# Patient Record
Sex: Female | Born: 1945 | Race: Black or African American | Hispanic: No | Marital: Single
Health system: Midwestern US, Community
[De-identification: ages and names within clinical notes are randomized; demographics above are authoritative.]

## PROBLEM LIST (undated history)

## (undated) DIAGNOSIS — R4182 Altered mental status, unspecified: Secondary | ICD-10-CM

## (undated) DIAGNOSIS — R748 Abnormal levels of other serum enzymes: Secondary | ICD-10-CM

## (undated) DIAGNOSIS — G40909 Epilepsy, unspecified, not intractable, without status epilepticus: Principal | ICD-10-CM

## (undated) DIAGNOSIS — R4701 Aphasia: Secondary | ICD-10-CM

## (undated) DIAGNOSIS — J329 Chronic sinusitis, unspecified: Secondary | ICD-10-CM

## (undated) DIAGNOSIS — K219 Gastro-esophageal reflux disease without esophagitis: Secondary | ICD-10-CM

## (undated) DIAGNOSIS — IMO0001 Reserved for inherently not codable concepts without codable children: Secondary | ICD-10-CM

## (undated) DIAGNOSIS — M109 Gout, unspecified: Secondary | ICD-10-CM

## (undated) DIAGNOSIS — I10 Essential (primary) hypertension: Secondary | ICD-10-CM

## (undated) DIAGNOSIS — G4733 Obstructive sleep apnea (adult) (pediatric): Secondary | ICD-10-CM

## (undated) DIAGNOSIS — R569 Unspecified convulsions: Secondary | ICD-10-CM

## (undated) DIAGNOSIS — G4710 Hypersomnia, unspecified: Secondary | ICD-10-CM

## (undated) DIAGNOSIS — G3184 Mild cognitive impairment, so stated: Secondary | ICD-10-CM

## (undated) DIAGNOSIS — R413 Other amnesia: Secondary | ICD-10-CM

## (undated) DIAGNOSIS — Z1211 Encounter for screening for malignant neoplasm of colon: Secondary | ICD-10-CM

## (undated) DIAGNOSIS — F32A Depression, unspecified: Secondary | ICD-10-CM

## (undated) HISTORY — DX: Gastro-esophageal reflux disease without esophagitis: K21.9

## (undated) HISTORY — DX: Obstructive sleep apnea (adult) (pediatric): G47.33

## (undated) HISTORY — DX: Chronic sinusitis, unspecified: J32.9

## (undated) HISTORY — DX: Reserved for inherently not codable concepts without codable children: IMO0001

## (undated) HISTORY — DX: Essential (primary) hypertension: I10

## (undated) HISTORY — DX: Gout, unspecified: M10.9

## (undated) HISTORY — PX: COLONOSCOPY: SHX174

## (undated) MED ORDER — OXYCODONE-ACETAMINOPHEN 5 MG-325 MG TAB
5-325 mg | ORAL_TABLET | Freq: Four times a day (QID) | ORAL | Status: DC | PRN
Start: ? — End: 2014-06-19

## (undated) MED ORDER — LEVETIRACETAM 500 MG TAB
500 mg | ORAL_TABLET | Freq: Two times a day (BID) | ORAL | Status: DC
Start: ? — End: 2013-06-30

## (undated) MED ORDER — DIAZEPAM 5 MG TAB
5 mg | ORAL_TABLET | Freq: Two times a day (BID) | ORAL | Status: DC | PRN
Start: ? — End: 2017-03-26

---

## 2001-09-23 ENCOUNTER — Encounter: Payer: Self-pay | Admitting: Internal Medicine

## 2001-09-23 ENCOUNTER — Ambulatory Visit (HOSPITAL_COMMUNITY): Admission: RE | Admit: 2001-09-23 | Discharge: 2001-09-23 | Payer: Self-pay | Admitting: Internal Medicine

## 2001-10-03 ENCOUNTER — Encounter: Payer: Self-pay | Admitting: Internal Medicine

## 2001-10-03 ENCOUNTER — Ambulatory Visit (HOSPITAL_COMMUNITY): Admission: RE | Admit: 2001-10-03 | Discharge: 2001-10-03 | Payer: Self-pay | Admitting: Internal Medicine

## 2001-10-14 ENCOUNTER — Ambulatory Visit (HOSPITAL_COMMUNITY): Admission: RE | Admit: 2001-10-14 | Discharge: 2001-10-14 | Payer: Self-pay | Admitting: Internal Medicine

## 2002-02-05 ENCOUNTER — Other Ambulatory Visit: Admission: RE | Admit: 2002-02-05 | Discharge: 2002-02-05 | Payer: Self-pay | Admitting: *Deleted

## 2002-02-05 ENCOUNTER — Encounter: Payer: Self-pay | Admitting: *Deleted

## 2002-02-05 ENCOUNTER — Ambulatory Visit (HOSPITAL_COMMUNITY): Admission: RE | Admit: 2002-02-05 | Discharge: 2002-02-05 | Payer: Self-pay | Admitting: *Deleted

## 2002-05-01 ENCOUNTER — Ambulatory Visit (HOSPITAL_COMMUNITY): Admission: RE | Admit: 2002-05-01 | Discharge: 2002-05-01 | Payer: Self-pay | Admitting: *Deleted

## 2002-11-10 ENCOUNTER — Ambulatory Visit (HOSPITAL_COMMUNITY): Admission: RE | Admit: 2002-11-10 | Discharge: 2002-11-10 | Payer: Self-pay | Admitting: Internal Medicine

## 2002-11-10 ENCOUNTER — Encounter: Payer: Self-pay | Admitting: Internal Medicine

## 2002-11-19 ENCOUNTER — Encounter: Payer: Self-pay | Admitting: Internal Medicine

## 2002-11-19 ENCOUNTER — Ambulatory Visit (HOSPITAL_COMMUNITY): Admission: RE | Admit: 2002-11-19 | Discharge: 2002-11-19 | Payer: Self-pay | Admitting: Internal Medicine

## 2004-01-22 ENCOUNTER — Ambulatory Visit (HOSPITAL_COMMUNITY): Admission: RE | Admit: 2004-01-22 | Discharge: 2004-01-22 | Payer: Self-pay | Admitting: Internal Medicine

## 2005-06-23 ENCOUNTER — Encounter (INDEPENDENT_AMBULATORY_CARE_PROVIDER_SITE_OTHER): Payer: Self-pay | Admitting: Specialist

## 2005-06-23 ENCOUNTER — Ambulatory Visit (HOSPITAL_COMMUNITY): Admission: RE | Admit: 2005-06-23 | Discharge: 2005-06-23 | Payer: Self-pay | Admitting: Obstetrics and Gynecology

## 2005-12-11 ENCOUNTER — Ambulatory Visit (HOSPITAL_COMMUNITY): Admission: RE | Admit: 2005-12-11 | Discharge: 2005-12-11 | Payer: Self-pay | Admitting: Internal Medicine

## 2006-11-08 ENCOUNTER — Ambulatory Visit (HOSPITAL_COMMUNITY): Admission: RE | Admit: 2006-11-08 | Discharge: 2006-11-08 | Payer: Self-pay | Admitting: Internal Medicine

## 2007-02-11 ENCOUNTER — Ambulatory Visit (HOSPITAL_COMMUNITY): Admission: RE | Admit: 2007-02-11 | Discharge: 2007-02-11 | Payer: Self-pay | Admitting: Internal Medicine

## 2008-12-28 ENCOUNTER — Emergency Department (HOSPITAL_COMMUNITY): Admission: EM | Admit: 2008-12-28 | Discharge: 2008-12-28 | Payer: Self-pay | Admitting: Family Medicine

## 2010-02-01 ENCOUNTER — Encounter: Payer: Self-pay | Admitting: Gastroenterology

## 2010-02-02 ENCOUNTER — Encounter: Payer: Self-pay | Admitting: Gastroenterology

## 2010-02-04 ENCOUNTER — Telehealth (INDEPENDENT_AMBULATORY_CARE_PROVIDER_SITE_OTHER): Payer: Self-pay

## 2010-03-21 ENCOUNTER — Ambulatory Visit (HOSPITAL_COMMUNITY): Admission: RE | Admit: 2010-03-21 | Discharge: 2010-03-21 | Payer: Self-pay | Admitting: Internal Medicine

## 2010-09-13 NOTE — Progress Notes (Signed)
Summary: pt cancelled TCS/ not due til 10/2011  Phone Note Outgoing Call   Caller: Patient Call placed by: Tyler Aas Call placed to: Patient Summary of Call: Received fax from Dr. Felecia Shelling, pt's TCS not due til 10/2011. She will cancel procedure for 02/07/2010. Initial call taken by: Cloria Spring LPN,  February 04, 2010 4:06 PM

## 2010-09-13 NOTE — Letter (Signed)
Summary: TRIAGE ORDER  TRIAGE ORDER   Imported By: Ave Filter 02/01/2010 10:48:27  _____________________________________________________________________  External Attachment:    Type:   Image     Comment:   External Document  Appended Document: TRIAGE ORDER Trilyte. Continue ASA. Hold HCTZ/Lisinopril on morning of procedure.  Appended Document: TRIAGE ORDER LMOM for pt to call. Rx and instructions faxed to [facility].   Appended Document: TRIAGE ORDER Pt informed Rx was sent to pharmacy, and reviewed instructions with her.

## 2010-09-13 NOTE — Letter (Signed)
Summary: Internal Other Domingo Dimes  Internal Other Domingo Dimes   Imported By: Cloria Spring LPN 27/25/3664 40:34:74  _____________________________________________________________________  External Attachment:    Type:   Image     Comment:   External Document

## 2010-12-30 NOTE — Op Note (Signed)
[facility]  Patient:    CORNELIOUS, DIVEN Visit Number: [id number] MRN: [id number]          Service Type: END Location: DAY Attending Physician:  Jonathon Bellows Dictated by:   Roetta Sessions, M.D. Proc. Date: 10/14/01 Admit Date:  10/14/2001   CC:         Avon Gully, M.D.   Operative Report  PROCEDURE:  Screening colonoscopy.  ENDOSCOPIST:  Roetta Sessions, M.D.  INDICATIONS:  The patient is a 65 year old lady referred from the courtesy of Dr. Felecia Shelling for colorectal cancer screening.  This approach has been discussed with Ms. Fabrizio at length at the bedside.  The potential risks, benefits, and alternatives have been reviewed and any questions answered.  Ms. Runkel has no GI symptoms.  Please see my handwritten H&P.  DESCRIPTION OF PROCEDURE:  The O2 saturation, blood pressure, pulse and respirations were monitored throughout the entire procedure.  Conscious sedation Versed 4 mg IV in divided doses, Demerol 75 mg IV in divided doses. Instrument was Olympus video colonoscope.  Findings:  Digital rectal exam revealed no abnormalities.  Endoscopic findings:  The prep was good.  Rectum and colon examination:  The rectal mucosa including a retroflexed view of the anal verge revealed only minimal internal hemorrhoids and a single anal _____.  Otherwise, rectal mucosa appeared normal.  Colonic mucosa was surveyed from the rectosigmoid junction, from the area of left transverse, right colon to the area of the appendiceal orifice, ileocecal valve.  These structures were well seen and photographed for the record.  No colonic mucosal abnormalities were noted.  Upon advancing the scope to the cecum from the level of the ileocecal valve the scope was slowly withdrawn. All previously mentioned mucosal surfaces were seen.  Again, no abnormalities were seen.  The patient tolerated the procedure well and was reactive in endoscopy.  IMPRESSION: 1.  Single anal _____ and internal hemorrhoids, otherwise normal rectum. 2. Normal colon.  RECOMMENDATIONS: 1. Follow up with Dr. Felecia Shelling. 2. Repeat colonoscopy in 10 years. Dictated by:   Roetta Sessions, M.D. Attending Physician:  Jonathon Bellows DD:  10/14/01 TD:  10/15/01 Job: [id number]

## 2010-12-30 NOTE — Op Note (Signed)
NAME:  Angelica Mcconnell, Angelica Mcconnell                      ACCOUNT NO.:  [id number]   MEDICAL RECORD NO.:  [id number]                   PATIENT TYPE:  AMB   LOCATION:  DAY                                  FACILITY:  [facility]   PHYSICIAN:  Donald P. Lisette Grinder, M.D.             DATE OF BIRTH:  1946/01/23   DATE OF PROCEDURE:  05/01/2002  DATE OF DISCHARGE:  05/01/2002                                 OPERATIVE REPORT   PREOPERATIVE DIAGNOSES:  1. Irregular uterine bleeding.  2. Thickened endometrial stripe on ultrasound.   SURGEON:  Roylene Reason. Lisette Grinder, M.D.   PROCEDURES PERFORMED:  1. Hysteroscopy.  2. Removal of uterine polyp.  3. Dilatation and curettage.   ESTIMATED BLOOD LOSS:  Minimal.   ANESTHESIA:  General.   FINDINGS AT TIME OF SURGERY:  Intrauterine polyp easily seen within the  uterine cavity which is excised.  Moderate amount of normal-appearing tissue  is obtained during the curettage.   SPECIMENS:  To pathology for permanent section only.   PROCEDURE:  The patient is taken to the operating room.  Vital signs are  stable.  The patient underwent uncomplicated induction of general anesthesia  after which time she is placed in a low lithotomy position, prepped and  draped in usual sterile manner.  A speculum is then placed in the vaginal  vault.  The cervix is visualized and noted to be without lesion.  No visual  polyps initially.  The anterior lip of the cervix is grasped with a single-  tooth tenaculum.  Progressive dilatation is then performed up to a size 17  dilator which allows passage of the diagnostic hysteroscope into the uterus  itself.  Appropriate photographs are taken.  The uterine cavity is noted to  contain a large uterine polyp which is photographed.  Both tubal ostia are  identified.  No other irregularities are noted thus the hysteroscope is  removed utilizing the Randall stone forceps.  I was able to manually search  the intrauterine cavity at which time I was  able to grasp and remove the  uterine polyp without difficulty.  This is sent off for permanent specimen  only.  Several other portions of polyp are likewise removed utilizing the  Randall forceps.  When exhaustive search reveals no additional polyps, a  bango curettage was performed utilizing two separate bangos.  Aggressive  curettage performed until all quadrants of the uterus are curettaged to  include the uterine fundus.  The procedure is then terminated.  No active  bleeding is noted to be occurring.  Bimanual examination following the  procedure reveals a uterus and ovary which are nonpalpable secondary to the  patient's morbid obesity.  No adnexal  masses, however, are palpated.  The uterus itself is noted to sound to a  depth of 10 cm and to have minimal dissensus with traction applied.  The  urethra is noted to be well supported.  No urethral  detachment.  No  cystocele identified.                                               Donald P. Lisette Grinder, M.D.    DPC/MEDQ  D:  05/06/2002  T:  05/06/2002  Job:  [id number]

## 2010-12-30 NOTE — Op Note (Signed)
NAMERYIN, SCHILLO            ACCOUNT NO.:  [id number]   MEDICAL RECORD NO.:  [id number]          PATIENT TYPE:  AMB   LOCATION:  SDC                           FACILITY:  [facility]   PHYSICIAN:  Maxie Better, M.D.DATE OF BIRTH:  1945/10/04   DATE OF PROCEDURE:  06/23/2005  DATE OF DISCHARGE:                                 OPERATIVE REPORT   PREOPERATIVE DIAGNOSES:  1.  Postmenopausal bleeding.  2.  History of atypical  complex endometrial hyperplasia.   OPERATION/PROCEDURE:  1.  Diagnostic hysteroscopy.  2.  Hysteroscopic resection of endometrial polyps.  3.  Resection of submucosal fibroid.  4.  Dilatation and curettage.   POSTOPERATIVE DIAGNOSES:  1.  Postmenopausal bleeding.  2.  History of atypical complex  endometrial hyperplasia.  3.  Endometrial polyps.  4.  Submucosal fibroid.   ANESTHESIA:  General and paracervical block.   SURGEON:  Maxie Better, M.D.   INDICATIONS:  This is a 65 year old postmenopausal woman not on hormone  replacement therapy who presented with postmenopausal bleeding with history  of notable for atypical endometrial hyperplasia and endometrial polyps,  diagnosed with hysteroscopy done elsewhere in 2003.  The patient now  presents for further surgical management.  Risks and benefits of the  procedure have been explained to the patient.  Consent was signed. The  patient was transferred to the operating room.   DESCRIPTION OF PROCEDURE:  Under adequate general anesthesia, the patient  was placed in the dorsal lithotomy position.  She was sterilely prepped and  draped in the usual fashion.  The bladder was catheterized for a large  amount of urine.  Examination under anesthesia revealed anteverted boggy  uterus.  No adnexal masses could be appreciated but limited by the patient's  body habitus.  A bivalve speculum was placed in the vagina.  Single-tooth  tenaculum was placed on the anterior lip of the cervix and 20 mL of 1%  Nesacaine was injected paracervically at 3 o'clock and 9 o'clock.  The  cervix was parous.  Cervix easily accepted the #[facility] dilator.  A  diagnostic hysteroscope was introduced into the uterine cavity at which time  multiple polyps were noted.  The tubal ostia could not be seen due to the  polyps.  The hysteroscope was then removed.  A clamp was then utilized to  remove two large polyps.  The resectoscope was then placed with the double  loop.  An additional endometrial polyp was noted as well as a left anterior  submucosal fibroid.  Both of these were then resected without difficulty.  Both tubal ostia were then completely seen.  The endometrial cavity was  otherwise thin appearing.  The endocervical canal was without any lesions.  The resectoscope was removed along with a polyp and fibroid that had been  resected.  Cavity was then curetted for scant amount of tissue.  The  resectoscope was then reinserted.  Cavity was inspected.  No other lesions  were noted and the procedure was then terminated by removing all  instruments.  Specimens were the endometrial polyps and fibroids.  Endometrial curetting.  Estimated blood loss  was minimal.  Fluid deficit was  450 mL with a tiny bit on the floor and on the bedding of the patient.  There were no complications.  The patient tolerated the procedure well and  was transferred to recovery in stable condition.      Maxie Better, M.D.  Electronically Signed     /MEDQ  D:  06/23/2005  T:  06/23/2005  Job:  [id number]

## 2010-12-30 NOTE — Discharge Summary (Signed)
   NAME:  Angelica Mcconnell, Angelica Mcconnell                      ACCOUNT NO.:  [id number]   MEDICAL RECORD NO.:  [id number]                   PATIENT TYPE:  AMB   LOCATION:  DAY                                  FACILITY:  [facility]   PHYSICIAN:  Donald P. Lisette Grinder, M.D.             DATE OF BIRTH:  1945-11-26   DATE OF ADMISSION:  05/01/2002  DATE OF DISCHARGE:  05/01/2002                                 DISCHARGE SUMMARY   PERTINENT LABORATORY STUDIES:  Hemoglobin 14.7, hematocrit 42.4, white blood  count 9.2.  HCG is negative.  Blood type A-positive.   DISCHARGE MEDICATIONS:  None.  The patient did receive 1 gram of IV Ancef  immediately preoperatively.   HOSPITAL COURSE:  See dictated operative note.  The patient did well  postoperatively with no postoperative complications, was able to tolerate  p.o. intake, able to ambulate and void without difficulty.  Thus, the  patient is discharged home on date of service, May 01, 2002.                                                Donald P. Lisette Grinder, M.D.    DPC/MEDQ  D:  05/06/2002  T:  05/06/2002  Job:  [id number]

## 2011-03-17 ENCOUNTER — Other Ambulatory Visit (HOSPITAL_COMMUNITY): Payer: Self-pay | Admitting: Internal Medicine

## 2011-03-17 DIAGNOSIS — Z139 Encounter for screening, unspecified: Secondary | ICD-10-CM

## 2011-03-24 ENCOUNTER — Ambulatory Visit (HOSPITAL_COMMUNITY)
Admission: RE | Admit: 2011-03-24 | Discharge: 2011-03-24 | Disposition: A | Discharge status: Home / Self Care | Payer: BC Managed Care – PPO | Source: Ambulatory Visit | Attending: Internal Medicine | Admitting: Internal Medicine

## 2011-03-24 DIAGNOSIS — Z139 Encounter for screening, unspecified: Secondary | ICD-10-CM

## 2011-03-24 DIAGNOSIS — Z1231 Encounter for screening mammogram for malignant neoplasm of breast: Secondary | ICD-10-CM | POA: Insufficient documentation
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
## 2012-11-02 NOTE — ED Notes (Signed)
Discharge Instructions Reviewed with patient per Chris PA. Discharge instructions given to patient per Chris PA. Patient able to return verbalize discharge instructions. Copy of discharge instructions given. 2 RX given to patient per Chris PA. Patient condition stable, Respiratory status WNL, Neurostatus intact. Ambulatory out of er, to home with self

## 2012-11-02 NOTE — ED Notes (Signed)
C-Collar removed per [place] PA
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
## 2013-06-29 NOTE — ED Provider Notes (Signed)
HPI Comments: Meghan [name] is a 67 y.o. female presenting via EMS to ED after being found unresponsive with snoring respirations by her niece. Per EMS, the pt's last known normal was 30 minutes PTA. Per EMS, the pt has a hx of seizures but had no witnessed seizure. Per EMS, the pt became increasingly more arousable and alert while en route. Per EMS, the pt had stable vital signs en route with a BG of 118. Per EMS, the pt has no hx of drug abuse.       PCP: Tenna Delaine, MD  PMHx Significant For: Seizures, HTN, Depression  PSHx Significant For: Hysterectomy  Social Hx: + tobacco, - EtOH, - illicit drugs      History limited secondary to pt unresponsive.  Written by Marlowe Aschoff, ED Scribe; as dictated by Raynelle Bring. Hinton Rao, MD.      The history is provided by the EMS personnel (pre hospital care providers interviewed. call sheet reviewed). The history is limited by the condition of the patient. No language interpreter was used.        Past Medical History   Diagnosis Date   ??? Seizures    ??? Hypertension    ??? Psychiatric disorder      depression        Past Surgical History   Procedure Laterality Date   ??? Hx gyn       hysterectomy         No family history on file.     History     Social History   ??? Marital Status: SINGLE     Spouse Name: [name]     Number of Children: [name]   ??? Years of Education: [name]     Occupational History   ??? Not on file.     Social History Main Topics   ??? Smoking status: Current Every Day Smoker   ??? Smokeless tobacco: Not on file   ??? Alcohol Use: No   ??? Drug Use: Not on file   ??? Sexually Active: Not on file     Other Topics Concern   ??? Not on file     Social History Narrative   ??? No narrative on file                  ALLERGIES: Review of patient's allergies indicates no known allergies.      Review of Systems   Unable to perform ROS: Patient unresponsive   Constitutional: Positive for activity change (Pt found unresponsive with snoring respirations).   Neurological:        Pt incontinent of  urine       Filed Vitals:    06/29/13 2322 06/29/13 2358 06/30/13 0036 06/30/13 0115   BP: 107/59  94/76 110/53   Pulse: 89  87 91   Temp:  98.3 ??F (36.8 ??C)     Resp: 16  20 24    Height: 5\' 5"  (1.651 m)      Weight: 81 kg (178 lb 9.2 oz)      SpO2: 95%  100% 100%            Physical Exam   Nursing note and vitals reviewed.  Constitutional: She appears well-developed and well-nourished.   HENT:   Head: Normocephalic and atraumatic.   Mouth/Throat: Oropharynx is clear and moist. No oropharyngeal exudate.   Eyes: Conjunctivae are normal. Pupils are equal, round, and reactive to light. Right eye exhibits no discharge. Left eye exhibits  no discharge.   Neck: Normal range of motion. Neck supple.   Cardiovascular: Normal rate, regular rhythm and intact distal pulses.  Exam reveals no gallop and no friction rub.    No murmur heard.  Pulmonary/Chest: Effort normal and breath sounds normal. No respiratory distress. She has no wheezes. She has no rales. She exhibits no tenderness.   Abdominal: Soft. Bowel sounds are normal. She exhibits no distension and no mass.   Genitourinary:   Pt incontinent of urine   Musculoskeletal: Normal range of motion. She exhibits no edema.   Lymphadenopathy:     She has no cervical adenopathy.   Neurological:   Lethargic but arousable. Responds to painful stimuli.   Skin: Skin is warm and dry. No rash noted. No erythema.   Written by Marlowe Aschoff, ED Scribe; as dictated by Raynelle Bring. Hinton Rao, MD       MDM     Differential Diagnosis; Clinical Impression; Plan:     DDx: Seizure, Medication Overdose, ACS, Arrhythmia, CVA    Will assess with basic cardiac labs, EKG, CXR and UA  Amount and/or Complexity of Data Reviewed:   Clinical lab tests:  Reviewed and ordered  Tests in the radiology section of CPT??:  Reviewed and ordered  Tests in the medicine section of the CPT??:  Reviewed and ordered   Obtain history from someone other than the patient:  Yes (EMS)   Review and summarize past medical  records:  Yes   Independant visualization of image, tracing, or specimen:  Yes (EKG)  Progress:   Patient progress:  Stable      Procedures    Chief Complaint   Patient presents with   ??? Seizure     Patient found unresponsive by EMS and is slowly becoming alert with possible seizure and is now postictal.       12:56 AM  The patients presenting problems have been discussed, and they are in agreement with the care plan formulated and outlined with them.  I have encouraged them to ask questions as they arise throughout their visit.    MEDICATIONS GIVEN:  Medications   sodium chloride (NS) flush 5-10 mL (not administered)   sodium chloride (NS) flush 5-10 mL (not administered)   sodium chloride 0.9 % bolus infusion 1,000 mL (1,000 mL IntraVENous New Bag 06/29/13 2343)   ondansetron (ZOFRAN) injection 4 mg (4 mg IntraVENous Given 06/29/13 2337)   LORazepam (ATIVAN) injection 1 mg (1 mg IntraVENous Given 06/29/13 2354)       LABS REVIEWED:  Labs Reviewed   CBC WITH AUTOMATED DIFF - Abnormal; Notable for the following:     RBC 3.65 (*)     HGB 11.1 (*)     HCT 33.9 (*)     NEUTROPHILS 30 (*)     LYMPHOCYTES 55 (*)     All other components within normal limits   METABOLIC PANEL, COMPREHENSIVE - Abnormal; Notable for the following:     Potassium 3.3 (*)     Chloride 112 (*)     CO2 19 (*)     Glucose 158 (*)     Creatinine 1.24 (*)     BUN/Creatinine ratio 11 (*)     GFR est AA 53 (*)     GFR est non-AA 43 (*)     Bilirubin, total 0.1 (*)     AST 13 (*)     All other components within normal limits   URINALYSIS W/ REFLEX CULTURE - Abnormal;  Notable for the following:     Appearance CLOUDY (*)     All other components within normal limits   LACTIC ACID, PLASMA - Abnormal; Notable for the following:     Lactic acid 5.1 (*)     All other components within normal limits   GLUCOSE, POC - Abnormal; Notable for the following:     Glucose (POC) 164 (*)     All other components within normal limits   SAMPLES BEING HELD   MAGNESIUM    CK W/ CKMB & INDEX   PROTHROMBIN TIME + INR   TROPONIN I   TOPIRAMATE   SAMPLE TO BLOOD BANK       RADIOLOGY RESULTS:  The following have been ordered and reviewed:  _____________________________________________________________________  CT HEAD WO CONT (Final result)  Result time: 06/30/13 00:27:03      Final result by Rad Results In Edi (06/30/13 00:27:03)      Narrative:    **Final Report**      ICD Codes / Adm.Diagnosis: 59 / Seizure AMS  Examination: CT HEAD WO CON - [id number] - Jun 30 2013 12:17AM  Accession No: [id number]  Reason: acute process      REPORT:  Indication: Seizure, confusion    Comparison 11/02/2012    Multiple axial images were obtained from the skull base to the vertex   without the use of intravenous contrast. Ventricles and sulci are normal for   patient's age. There is no midline shift or herniation. The examination is   negative for acute infarct, mass lesion, or hemorrhage. The visualized   portions of the petrous temporal bones, paranasal sinuses, and orbits are   unremarkable.       IMPRESSION: No acute process.          Signing/Reading Doctor: Gordan Payment [phone])   Approved: Gordan Payment [phone]) Jun 30 2013 12:24AM     _____________________________________________________________________    EKG interpretation: (Preliminary)  Rhythm: normal sinus rhythm; and regular . Rate (approx.): 64; Axis: normal; P wave: normal; QRS interval: normal ; ST/T wave: normal;   Written by Marlowe Aschoff, ED Scribe; as dictated by Raynelle Bring. Hinton Rao, MD    PROCEDURES:        CONSULTATIONS:       PROGRESS NOTES:  12:56 AM  Conveyed case with the pt's family. They report that the pt is compliant with all medications.  Written by Marlowe Aschoff, ED Scribe; as dictated by Raynelle Bring. Hinton Rao, MD    2:24 AM  Pt has had no new episodes of seizures. Will discharge home with Dr. Jettie Pagan Follow up and Keppra 500 mg.  Written by Marlowe Aschoff, ED Scribe; as dictated by Raynelle Bring. Hinton Rao, MD         DIAGNOSIS:    1. Seizure        PLAN:  1-Discharge home      ED COURSE: The patients hospital course has been uncomplicated.    2:25 AM  Meghan [name]'s  results have been reviewed with her.  She has been counseled regarding her diagnosis.  She verbally conveys understanding and agreement of the signs, symptoms, diagnosis, treatment and prognosis and additionally agrees to follow up as recommended with Dr. Jettie Pagan in 24 - 48 hours.  She also agrees with the care-plan and conveys that all of her questions have been answered.  I have also put together some discharge instructions for her that include: 1) educational information regarding their diagnosis, 2)  how to care for their diagnosis at home, as well a 3) list of reasons why they would want to return to the ED prior to their follow-up appointment, should their condition change.

## 2013-06-29 NOTE — ED Notes (Signed)
Pt responsive to pain, respirations even, non labored. Pt non cooperative for urine cath, MD aware.

## 2013-06-30 LAB — CBC WITH AUTOMATED DIFF
ABS. BASOPHILS: 0 10*3/uL (ref 0.0–0.1)
ABS. EOSINOPHILS: 0.2 10*3/uL (ref 0.0–0.4)
ABS. LYMPHOCYTES: 3.5 10*3/uL (ref 0.8–3.5)
ABS. MONOCYTES: 0.7 10*3/uL (ref 0.0–1.0)
ABS. NEUTROPHILS: 1.9 10*3/uL (ref 1.8–8.0)
BASOPHILS: 1 % (ref 0–1)
EOSINOPHILS: 3 % (ref 0–7)
HCT: 33.9 % — ABNORMAL LOW (ref 35.0–47.0)
HGB: 11.1 g/dL — ABNORMAL LOW (ref 11.5–16.0)
LYMPHOCYTES: 55 % — ABNORMAL HIGH (ref 12–49)
MCH: 30.4 PG (ref 26.0–34.0)
MCHC: 32.7 g/dL (ref 30.0–36.5)
MCV: 92.9 FL (ref 80.0–99.0)
MONOCYTES: 11 % (ref 5–13)
NEUTROPHILS: 30 % — ABNORMAL LOW (ref 32–75)
PLATELET: 264 10*3/uL (ref 150–400)
RBC: 3.65 M/uL — ABNORMAL LOW (ref 3.80–5.20)
RDW: 13.9 % (ref 11.5–14.5)
WBC: 6.3 10*3/uL (ref 3.6–11.0)

## 2013-06-30 LAB — URINALYSIS W/ REFLEX CULTURE
Bacteria: NEGATIVE /hpf
Bilirubin: NEGATIVE
Blood: NEGATIVE
Glucose: NEGATIVE mg/dL
Ketone: NEGATIVE mg/dL
Leukocyte Esterase: NEGATIVE
Nitrites: NEGATIVE
Protein: NEGATIVE mg/dL
Specific gravity: 1.02 (ref 1.003–1.030)
Urobilinogen: 1 EU/dL (ref 0.2–1.0)
pH (UA): 7 (ref 5.0–8.0)

## 2013-06-30 LAB — METABOLIC PANEL, COMPREHENSIVE
A-G Ratio: 1.2 (ref 1.1–2.2)
ALT (SGPT): 18 U/L (ref 12–78)
AST (SGOT): 13 U/L — ABNORMAL LOW (ref 15–37)
Albumin: 3.7 g/dL (ref 3.5–5.0)
Alk. phosphatase: 51 U/L (ref 45–117)
Anion gap: 13 mmol/L (ref 5–15)
BUN/Creatinine ratio: 11 — ABNORMAL LOW (ref 12–20)
BUN: 14 MG/DL (ref 6–20)
Bilirubin, total: 0.1 MG/DL — ABNORMAL LOW (ref 0.2–1.0)
CO2: 19 mmol/L — ABNORMAL LOW (ref 21–32)
Calcium: 8.5 MG/DL (ref 8.5–10.1)
Chloride: 112 mmol/L — ABNORMAL HIGH (ref 97–108)
Creatinine: 1.24 MG/DL — ABNORMAL HIGH (ref 0.45–1.15)
GFR est AA: 53 mL/min/{1.73_m2} — ABNORMAL LOW (ref >60)
GFR est non-AA: 43 mL/min/{1.73_m2} — ABNORMAL LOW (ref >60)
Globulin: 3.2 g/dL (ref 2.0–4.0)
Glucose: 158 mg/dL — ABNORMAL HIGH (ref 65–100)
Potassium: 3.3 mmol/L — ABNORMAL LOW (ref 3.5–5.1)
Protein, total: 6.9 g/dL (ref 6.4–8.2)
Sodium: 144 mmol/L (ref 136–145)

## 2013-06-30 LAB — EKG, 12 LEAD, INITIAL
Atrial Rate: 88 {beats}/min
Calculated P Axis: 72 degrees
Calculated R Axis: 58 degrees
Calculated T Axis: 74 degrees
Diagnosis: NORMAL
P-R Interval: 152 ms
Q-T Interval: 370 ms
QRS Duration: 86 ms
QTC Calculation (Bezet): 447 ms
Ventricular Rate: 88 {beats}/min

## 2013-06-30 LAB — MAGNESIUM: Magnesium: 1.8 mg/dL (ref 1.6–2.4)

## 2013-06-30 LAB — LACTIC ACID: Lactic acid: 5.1 MMOL/L — CR (ref 0.4–2.0)

## 2013-06-30 LAB — GLUCOSE, POC: Glucose (POC): 164 mg/dL — ABNORMAL HIGH (ref 65–105)

## 2013-06-30 LAB — TROPONIN I: Troponin-I, Qt.: <0.04 ng/mL (ref <0.05)

## 2013-06-30 LAB — PROTHROMBIN TIME + INR
INR: 1.1 (ref 0.9–1.1)
Prothrombin time: 11.4 s (ref 9.4–11.7)

## 2013-06-30 LAB — CK W/ CKMB & INDEX
CK - MB: 1.1 NG/ML (ref 0.5–3.6)
CK-MB Index: 0.7 (ref 0–2.5)
CK: 169 U/L (ref 26–192)

## 2013-06-30 MED ORDER — PHENYTOIN SODIUM EXTENDED 100 MG CAP
100 mg | ORAL_CAPSULE | Freq: Two times a day (BID) | ORAL | Status: DC
Start: 2013-06-30 — End: 2014-06-01

## 2013-06-30 MED ORDER — SODIUM CHLORIDE 0.9 % IJ SYRG
Freq: Three times a day (TID) | INTRAMUSCULAR | Status: DC
Start: 2013-06-30 — End: 2013-06-30

## 2013-06-30 MED ADMIN — ondansetron (ZOFRAN) 4 mg/2 mL injection: INTRAVENOUS | @ 05:00:00 | NDC 00641607801

## 2013-06-30 MED ADMIN — phenytoin (DILANTIN) 1,000 mg in 0.9% sodium chloride 250 mL IVPB: INTRAVENOUS | @ 08:00:00 | NDC 00641255545

## 2013-06-30 MED ADMIN — LORazepam (ATIVAN) injection 1 mg: INTRAVENOUS | @ 05:00:00 | NDC 00641604401

## 2013-06-30 MED ADMIN — sodium chloride 0.9 % bolus infusion 1,000 mL: INTRAVENOUS | @ 05:00:00 | NDC 00409798309

## 2013-06-30 NOTE — ED Notes (Signed)
Report to Page, RN. Pt resting quietly at this time, awaiting meds from pharmacy and dispo.

## 2013-06-30 NOTE — ED Notes (Signed)
Pt alert, oriented to place and situation at this time.

## 2013-06-30 NOTE — ED Notes (Signed)
Pt discharged by Dr Hinton Rao, pt taken to car via wheelchair, provided disposable shirt

## 2013-06-30 NOTE — ED Notes (Signed)
Dr Hinton Rao changed the medication ordered and awaiting the medicine from pharmacy, pt resting in bed, no seizure activity noted, family bedside

## 2013-07-01 LAB — TOPIRAMATE: Topiramate: 13 ug/mL (ref 2.0–25.0)

## 2013-07-15 ENCOUNTER — Ambulatory Visit (INDEPENDENT_AMBULATORY_CARE_PROVIDER_SITE_OTHER): Payer: 59 | Admitting: Neurology

## 2013-07-15 ENCOUNTER — Encounter (INDEPENDENT_AMBULATORY_CARE_PROVIDER_SITE_OTHER): Payer: Self-pay

## 2013-07-15 ENCOUNTER — Encounter: Payer: Self-pay | Admitting: Neurology

## 2013-07-15 VITALS — BP 134/90 | HR 65 | Temp 97.9°F | Ht 63.5 in | Wt 246.0 lb

## 2013-07-15 DIAGNOSIS — R002 Palpitations: Secondary | ICD-10-CM

## 2013-07-15 DIAGNOSIS — I10 Essential (primary) hypertension: Secondary | ICD-10-CM

## 2013-07-15 DIAGNOSIS — G4733 Obstructive sleep apnea (adult) (pediatric): Secondary | ICD-10-CM

## 2013-07-15 HISTORY — DX: Obstructive sleep apnea (adult) (pediatric): G47.33

## 2013-07-15 NOTE — Patient Instructions (Signed)

## 2013-07-15 NOTE — Progress Notes (Signed)
Subjective:    Patient ID: Angelica Mcconnell is a 67 y.o. female.  HPI   Huston Foley, MD, PhD [facility] [address] [address] [place], [place] [address]  Dear Dr. Felipa Eth,   I saw your patient, Angelica Mcconnell, upon your kind request in my neurologic clinic today for initial consultation of her sleep disorder, in particular, concern for obstructive sleep apnea. The patient is unaccompanied today. As you know, Angelica Mcconnell is a very friendly 67 year old right-handed woman with an underlying medical history of obesity, hypertension, prior smoking, reflux disease, who has been reported to have loud snoring and complaints of nonrestorative sleep, morning headaches and daytime tiredness. She reports tooth grinding and has been using a OTC bite guard occasionally at night, but not every night. She wakes up with dry mouth. She quite smoking and drinking EtOH in the 90s. She drinks caffeine occasionally. She has been exercising, but has not lost very much weight yet.   Her typical bedtime is reported to be around 10 or 11 PM and usual wake time is around 8 AM. Sleep onset typically occurs within 30 minutes. She reports feeling marginally rested upon awakening. She wakes up on an average 2 times in the middle of the night and has to go to the bathroom 2 times on a typical night. She admits to occasional morning headaches, but feels, this is a sinus congestion type problems. She has post-nasal drip and a raspy or dry cough.  She denies frank excessive daytime somnolence (EDS) and Her Epworth Sleepiness Score (ESS) is 3/24 today, but she feels exhausted sometimes during the day. She has not fallen asleep while driving. The patient has not been taking a scheduled nap.  She has been known to snore for the past few years. Snoring is reportedly marked, and not clearly associated with choking sounds or witnessed apneas. The patient admits toa sense of choking or strangling  feeling on a rare occasion. There is no report of nighttime reflux, with occasional nighttime cough experienced. The patient has not noted any RLS symptoms and is not known to kick while asleep or before falling asleep. There is no family history of RLS or OSA.  She is a restless sleeper and in the morning, the bed is quite disheveled.   She denies cataplexy, sleep paralysis, hypnagogic or hypnopompic hallucinations, or sleep attacks, but had sleep paralysis episodes in the distant past. She does not report any vivid dreams, nightmares, dream enactments, or parasomnias, such as sleep talking or sleep walking. The patient has not had a sleep study or a home sleep test.  Her bedroom is usually dark and cool. There is a TV in the bedroom and usually it is not on at night.  She has occasional palpitations and occasional SOB.   Her Past Medical History Is Significant For: Past Medical History  Diagnosis Date  . HTN (hypertension)   . Reflux   . Gout   . Sinus infection     Her Past Surgical History Is Significant For: History reviewed. No pertinent past surgical history.  Her Family History Is Significant For: Family History  Problem Relation Age of Onset  . Heart failure Mother   . Diabetes Mother   . Thyroid disease Mother     Her Social History Is Significant For: History   Social History  . Marital Status: Single    Spouse Name: [name]    Number of Children: [name]  . Years of Education: [name]  Social History Main Topics  . Smoking status: Former Smoker    Quit date: 07/15/1989  . Smokeless tobacco: None  . Alcohol Use: No  . Drug Use: No  . Sexual Activity: None   Other Topics Concern  . None   Social History Narrative  . None    Her Allergies Are:  No Known Allergies:   Her Current Medications Are:  Outpatient Encounter Prescriptions as of 07/15/2013  Medication Sig  . allopurinol (ZYLOPRIM) 100 MG tablet Take 1 tablet by mouth daily.  . fluticasone (FLONASE) 50  MCG/ACT nasal spray Place 1-2 sprays into both nostrils daily.  . irbesartan (AVAPRO) 300 MG tablet Take 1 tablet by mouth daily.  Marland Kitchen omeprazole (PRILOSEC) 20 MG capsule Take 1 capsule by mouth daily.   Review of Systems:  Out of a complete 14 point review of systems, all are reviewed and negative with the exception of these symptoms as listed below:   Review of Systems  Constitutional: Positive for unexpected weight change (gain).  HENT: Positive for rhinorrhea.   Eyes: Negative.   Respiratory:       Snoring  Cardiovascular: Negative.   Gastrointestinal: Negative.   Endocrine: Positive for polydipsia.  Genitourinary: Negative.   Musculoskeletal: Negative.   Skin: Negative.   Allergic/Immunologic: Negative.   Neurological: Negative.   Hematological: Negative.   Psychiatric/Behavioral: Positive for sleep disturbance.    Objective:  Neurologic Exam  Physical Exam Physical Examination:   Filed Vitals:   07/15/13 1109  BP: 134/90  Pulse: 65  Temp: 97.9 F (36.6 C)    General Examination: The patient is a very pleasant 67 y.o. female in no acute distress. She appears well-developed and well-nourished and very well groomed.   HEENT: Normocephalic, atraumatic, pupils are equal, round and reactive to light and accommodation. Funduscopic exam is normal with sharp disc margins noted. Extraocular tracking is good without limitation to gaze excursion or nystagmus noted. Normal smooth pursuit is noted. Hearing is grossly intact. Tympanic membranes are clear bilaterally. Face is symmetric with normal facial animation and normal facial sensation. Speech is clear with no dysarthria noted. There is no hypophonia. There is no lip, neck/head, jaw or voice tremor. Neck is supple with full range of passive and active motion. There are no carotid bruits on auscultation. Oropharynx exam reveals: mild mouth dryness, adequate dental hygiene and moderate airway crowding, due to large tongue, redundant  soft palate and narrow airway entry. Mallampati is class III. Tongue protrudes centrally and palate elevates symmetrically. Tonsils are 1+. Neck size is 16.75 inches. Nasal inspection reveals severe nasal congestion and inferior turbinate hypertrophy, R>L.  Chest: Clear to auscultation without wheezing, rhonchi or crackles noted.  Heart: S1+S2+0, regular and normal without murmurs, rubs or gallops noted.   Abdomen: Soft, non-tender and non-distended with normal bowel sounds appreciated on auscultation.  Extremities: There is no pitting edema in the distal lower extremities bilaterally. Pedal pulses are intact.  Skin: Warm and dry without trophic changes noted. There are no varicose veins.  Musculoskeletal: exam reveals no obvious joint deformities, tenderness or joint swelling or erythema.   Neurologically:  Mental status: The patient is awake, alert and oriented in all 4 spheres. Her memory, attention, language and knowledge are appropriate. There is no aphasia, agnosia, apraxia or anomia. Speech is clear with normal prosody and enunciation. Thought process is linear. Mood is congruent and affect is normal.  Cranial nerves are as described above under HEENT exam. In addition, shoulder shrug is normal  with equal shoulder height noted. Motor exam: Normal bulk, strength and tone is noted. There is no drift, tremor or rebound. Romberg is negative. Reflexes are 2+ throughout. Toes are downgoing bilaterally. Fine motor skills are intact with normal finger taps, normal hand movements, normal rapid alternating patting, normal foot taps and normal foot agility.  Cerebellar testing shows no dysmetria or intention tremor on finger to nose testing. Heel to shin is unremarkable bilaterally. There is no truncal or gait ataxia.  Sensory exam is intact to light touch, pinprick, vibration, temperature sense and proprioception in the upper and lower extremities.  Gait, station and balance are unremarkable. No  veering to one side is noted. No leaning to one side is noted. Posture is age-appropriate and stance is narrow based. No problems turning are noted. She turns en bloc. Tandem walk is unremarkable. Intact toe and heel stance is noted.               Assessment and Plan:   In summary, Angelica Mcconnell is a very pleasant 67 y.o.-year old female with a history and physical exam concerning for obstructive sleep apnea (OSA). I had a long chat with the patient about my findings and the diagnosis, its prognosis and treatment options. We talked about medical treatments and non-pharmacological approaches. I explained in particular the risks and ramifications of untreated moderate to severe OSA, especially with respect to developing cardiovascular disease down the Road, including congestive heart failure, difficult to treat hypertension, cardiac arrhythmias, or stroke. Even type 2 diabetes has in part been linked to untreated OSA. We talked about trying to maintain a healthy lifestyle in general, as well as the importance of weight control. I encouraged the patient to eat healthy, exercise daily and keep well hydrated, to keep a scheduled bedtime and wake time routine, to not skip any meals and eat healthy snacks in between meals. She has had a tendency to skip breakfast and skip meals and then overeat at the end of the day.  I recommended the following at this time: sleep study with potential positive airway pressure titration.  I explained the sleep test procedure to the patient and also outlined possible surgical and non-surgical treatment options of OSA, including the use of a custom-made dental device, upper airway surgical options, such as pillar implants, radiofrequency surgery, tongue base surgery, and UPPP. I also explained the CPAP treatment option to the patient, who indicated that she would be willing to try CPAP if the need arises. I explained the importance of being compliant with PAP treatment, not only  for insurance purposes but primarily to improve Her symptoms, and for the patient's long term health benefit, including to reduce Her cardiovascular risks. I answered all her questions today and the patient was in agreement. I would like to see her back after the sleep study is completed and encouraged her to call with any interim questions, concerns, problems or updates.   Thank you very much for allowing me to participate in the care of this nice patient. If I can be of any further assistance to you please do not hesitate to call me at [phone].  Sincerely,   Huston Foley, MD, PhD

## 2013-08-05 ENCOUNTER — Ambulatory Visit (INDEPENDENT_AMBULATORY_CARE_PROVIDER_SITE_OTHER): Payer: 59

## 2013-08-05 DIAGNOSIS — G4733 Obstructive sleep apnea (adult) (pediatric): Secondary | ICD-10-CM

## 2013-08-05 DIAGNOSIS — I10 Essential (primary) hypertension: Secondary | ICD-10-CM

## 2013-08-05 DIAGNOSIS — R002 Palpitations: Secondary | ICD-10-CM

## 2013-08-05 DIAGNOSIS — R9431 Abnormal electrocardiogram [ECG] [EKG]: Secondary | ICD-10-CM

## 2013-08-05 DIAGNOSIS — G479 Sleep disorder, unspecified: Secondary | ICD-10-CM

## 2013-08-22 ENCOUNTER — Telehealth: Payer: Self-pay | Admitting: Neurology

## 2013-08-22 DIAGNOSIS — G4733 Obstructive sleep apnea (adult) (pediatric): Secondary | ICD-10-CM

## 2013-08-22 NOTE — Telephone Encounter (Signed)
I called and left a message for the patient about her recent sleep study. Informed the patient that the study revealed the diagnosis of obstructive sleep apnea and Dr. Athar recommeFrances Furbishnds CPAP therapy and this will require a repeat sleep study for CPAP titration and mask fitting. I will mail a copy of this report to the patient and fax a copy to Dr. Vicente MalesAvva's office.

## 2013-08-22 NOTE — Telephone Encounter (Signed)
Please call and notify the patient that the recent sleep study did confirm the diagnosis of obstructive sleep apnea and that I recommend treatment for this in the form of CPAP. This will require a repeat sleep study for proper titration and mask fitting. Please explain to patient and arrange for a CPAP titration study. I have placed an order in the chart. Thanks, Annitta Fifield, MD, PhD [facility]  

## 2013-08-27 ENCOUNTER — Encounter: Payer: Self-pay | Admitting: *Deleted

## 2013-08-27 ENCOUNTER — Other Ambulatory Visit (HOSPITAL_COMMUNITY): Payer: Self-pay | Admitting: Internal Medicine

## 2013-08-27 DIAGNOSIS — Z139 Encounter for screening, unspecified: Secondary | ICD-10-CM

## 2013-09-02 ENCOUNTER — Ambulatory Visit (HOSPITAL_COMMUNITY)
Admission: RE | Admit: 2013-09-02 | Discharge: 2013-09-02 | Disposition: A | Discharge status: Home / Self Care | Payer: Medicare PPO | Source: Ambulatory Visit | Attending: Internal Medicine | Admitting: Internal Medicine

## 2013-09-02 DIAGNOSIS — Z1231 Encounter for screening mammogram for malignant neoplasm of breast: Secondary | ICD-10-CM | POA: Insufficient documentation

## 2013-09-02 DIAGNOSIS — Z139 Encounter for screening, unspecified: Secondary | ICD-10-CM

## 2013-09-11 ENCOUNTER — Ambulatory Visit (INDEPENDENT_AMBULATORY_CARE_PROVIDER_SITE_OTHER): Payer: 59

## 2013-09-11 DIAGNOSIS — G4733 Obstructive sleep apnea (adult) (pediatric): Secondary | ICD-10-CM

## 2013-09-11 DIAGNOSIS — G479 Sleep disorder, unspecified: Secondary | ICD-10-CM

## 2013-09-11 DIAGNOSIS — R9431 Abnormal electrocardiogram [ECG] [EKG]: Secondary | ICD-10-CM

## 2013-09-19 ENCOUNTER — Telehealth: Payer: Self-pay | Admitting: Neurology

## 2013-09-19 DIAGNOSIS — G4733 Obstructive sleep apnea (adult) (pediatric): Secondary | ICD-10-CM

## 2013-09-19 NOTE — Telephone Encounter (Signed)
Please call and inform patient that I have entered an order for treatment with PAP. She did well during the latest sleep study with CPAP. We will, therefore, arrange for a machine for home use through a DME (durable medical equipment) company of Her choice; and I will see the patient back in follow-up in about 6 weeks. Please also explain to the patient that I will be looking out for compliance data downloaded from the machine, which can be done remotely through a modem at times or stored on an SD card in the back of the machine. At the time of the followup appointment we will discuss sleep study results and how it is going with PAP treatment at home. Please advise patient to bring Her machine at the time of the visit; at least for the first visit, even though this is cumbersome. Bringing the machine for every visit after that may not be needed, but often helps for the first visit. Please also make sure, the patient has a follow-up appointment with me in about 6 weeks from the setup date, thanks.   Dequon Schnebly, MD, PhD [facility]  

## 2013-09-22 ENCOUNTER — Encounter: Payer: Self-pay | Admitting: *Deleted

## 2013-09-22 NOTE — Telephone Encounter (Signed)
I called and left a message for the patient about her recent sleep study results. I informed the patient that she did well on CPAP during the night of her study and Dr. Frances FurbishAthar recommends CPAP therapy at home. I will send the CPAP order to [facility] and they'll contact her once they receive benefits for the CPAP machine. I will fax a copy of the report to Dr. Vicente MalesAvva's office and mail a copy of the report along with a follow up instruction to the patient.

## 2013-10-03 ENCOUNTER — Telehealth: Payer: Self-pay | Admitting: Neurology

## 2013-10-03 NOTE — Telephone Encounter (Signed)
Patient called and stated that [facility] states that they have not receive the order for CPAP. Please advise.

## 2013-10-03 NOTE — Telephone Encounter (Signed)
I called and spoke with the patient and informed her that [facility] has her CPAP order and they stated they have tried calling her several time to let her know that her order was ready. I have given [facility]'s phone number to the patient to call them today.

## 2013-10-03 NOTE — Telephone Encounter (Signed)
Will route to Angelica Mcconnell to check with [name] regarding this referral?  [facility] has told pt they do not have an order.  Also, the patient lives in [place] and would like a dme company there.  If [facility] does not have an office in [place], please send this referral to [facility].

## 2013-10-03 NOTE — Telephone Encounter (Signed)
Pt called states [facility] states they have not recd the order for the CPAP. I see the note where this was done back on 09/22/13 and if we could send the order back over. Pt wants someone to call her to see if we deal with anyone in [place] where [place] is located and also wants to know when this [facility] will [facility].

## 2013-10-31 ENCOUNTER — Encounter: Payer: Self-pay | Admitting: Neurology

## 2013-11-14 ENCOUNTER — Encounter: Payer: Self-pay | Admitting: Neurology

## 2013-11-20 ENCOUNTER — Ambulatory Visit: Payer: 59 | Admitting: Neurology

## 2013-11-21 NOTE — Progress Notes (Signed)
Quick Note:  I reviewed the patient's CPAP compliance data from 10/15/2013 to 11/13/2013, which is a total of 30 days, during which time the patient used CPAP every day. The average usage for all days was 9 hours and 1 minutes. The percent used days greater than 4 hours was 100%, indicating superb compliance. The residual AHI was 1.6 per hour, indicating an appropriate treatment pressure with very little leak documented. I will review this data with the patient at the next office visit, which is currently routinely scheduled for 11/25/2013 at 10 AM, provide feedback and additional troubleshooting if need be.  Huston FoleySaima Amazing Cowman, MD, PhD [facility] ([facility])   ______

## 2013-11-25 ENCOUNTER — Encounter: Payer: Self-pay | Admitting: Neurology

## 2013-11-25 ENCOUNTER — Ambulatory Visit (INDEPENDENT_AMBULATORY_CARE_PROVIDER_SITE_OTHER): Payer: Medicare PPO | Admitting: Neurology

## 2013-11-25 VITALS — BP 139/90 | HR 96 | Temp 98.3°F | Ht 63.5 in | Wt 252.0 lb

## 2013-11-25 DIAGNOSIS — I10 Essential (primary) hypertension: Secondary | ICD-10-CM

## 2013-11-25 DIAGNOSIS — E669 Obesity, unspecified: Secondary | ICD-10-CM

## 2013-11-25 DIAGNOSIS — G4733 Obstructive sleep apnea (adult) (pediatric): Secondary | ICD-10-CM

## 2013-11-25 DIAGNOSIS — J069 Acute upper respiratory infection, unspecified: Secondary | ICD-10-CM

## 2013-11-25 NOTE — Patient Instructions (Signed)

## 2013-11-25 NOTE — Progress Notes (Signed)
Subjective:    Patient ID: Angelica Mcconnell is a 68 y.o. female.  HPI    Interim history:   Ms. Angelica Mcconnell is a very friendly 68 year old right-handed woman with an underlying medical history of obesity, hypertension, prior smoking, reflux disease, who presents for followup consultation of her obstructive sleep apnea. The patient is unaccompanied today. I first met her on 07/15/2013, at which time she reported loud snoring, nonrestorative sleep, morning headaches and daytime somnolence. She also reported bruxism, for which she was using an over-the-counter bite guard. I advised her to return for sleep study. She is at baseline sleep study followed by a CPAP titration study. I went over her test results in detail with her today. Her baseline sleep study from 08/05/2013 showed a sleep efficiency of 74.9% with a latency to sleep of 68.5 minutes and wake after sleep onset of 39 minutes with mild to moderate sleep fragmentation noted. She had increased percentages of stage I and stage II sleep, I decreased percentage of slow-wave sleep and a mildly increased percentage of REM sleep with a normal REM latency. She had mild snoring with rare loud snoring noted. She had a total AHI of 5.4 per hour, rising to 17.4 per hour in REM sleep. Her baseline oxygen saturation was 92% with a nadir of 80%. Time below 88% saturation was 4 minutes and 4 seconds. She was requested to return for a CPAP titration study. She had this test on 09/11/2013. Sleep efficiency was 76.6% with a latency to sleep of 27 minutes and wake after sleep onset of 70 minutes with moderate sleep fragmentation noted. She had an increased percentage of stage II sleep, I decreased percentage of deep sleep, and a near normal percentage of REM sleep with a mildly reduced REM latency. She had occasional PVCs and PACs on EKG. Of note, during the baseline sleep study she had similar EKG changes. Snoring was eliminated with CPAP. She was started on 5 cm and  titrated to 7 cm. On 6 cm of pressure her AHI was 0 per hour. Pre-supine REM sleep was achieved. Based on the test results I prescribed CPAP for her. I reviewed the patient's CPAP compliance data from 10/15/2013 to 11/13/2013, which is a total of 30 days, during which time the patient used CPAP every day. The average usage for all days was 9 hours and 1 minutes. The percent used days greater than 4 hours was 100%, indicating superb compliance. The residual AHI was 1.6 per hour, indicating an appropriate treatment pressure with very little leak documented.  Today, I reviewed her compliance data from 10/14/2013 through 11/24/2013 which is the last 42 days during which times he use CPAP every night with percent used days greater than 4 hours of 100%, indicating superb compliance. Average usage was 9 hours and 3 minutes with a residual AHI of 1.5/h. Leak was very low. Pressure is 6 cm with EPR of 3.  Today, she reports, that she sleeps better with CPAP, wakes up better rested and wakes up less times to go to the bathroom in the night. She has developed a cold/congestion and has a cough. She is on OTC cough medicine and she had elevated temperatures and low grade fever 2 days ago. She saw Dr. Dagmar Hait 2 weeks ago. She has been using the nasal rinse and Flonase as well.   Her Past Medical History Is Significant For: Past Medical History  Diagnosis Date  . HTN (hypertension)   . Reflux   . Gout   .  Sinus infection   . OSA (obstructive sleep apnea) 07/15/2013    Her Past Surgical History Is Significant For: History reviewed. No pertinent past surgical history.  Her Family History Is Significant For: Family History  Problem Relation Age of Onset  . Heart failure Mother   . Diabetes Mother   . Thyroid disease Mother     Her Social History Is Significant For: History   Social History  . Marital Status: Single    Spouse Name: [name]    Number of Children: [name]  . Years of Education: [name]   Social  History Main Topics  . Smoking status: Former Smoker    Quit date: 07/15/1989  . Smokeless tobacco: None  . Alcohol Use: No  . Drug Use: No  . Sexual Activity: None   Other Topics Concern  . None   Social History Narrative  . None    Her Allergies Are:  No Known Allergies:   Her Current Medications Are:  Outpatient Encounter Prescriptions as of 11/25/2013  Medication Sig  . allopurinol (ZYLOPRIM) 100 MG tablet Take 1 tablet by mouth daily.  . fluticasone (FLONASE) 50 MCG/ACT nasal spray Place 1-2 sprays into both nostrils daily.  . irbesartan (AVAPRO) 300 MG tablet Take 1 tablet by mouth daily.  Marland Kitchen omeprazole (PRILOSEC) 20 MG capsule Take 1 capsule by mouth daily.  :  Review of Systems:  Out of a complete 14 point review of systems, all are reviewed and negative with the exception of these symptoms as listed below:   Review of Systems  Constitutional: Positive for fever and appetite change.  HENT: Positive for rhinorrhea.   Eyes: Positive for discharge, redness and itching.  Respiratory: Positive for cough, chest tightness and wheezing.   Cardiovascular: Negative.   Gastrointestinal: Negative.   Endocrine: Negative.   Genitourinary: Negative.   Musculoskeletal: Negative.   Skin:       itching  Allergic/Immunologic: Positive for environmental allergies.  Neurological: Negative.   Hematological: Negative.   Psychiatric/Behavioral: Negative.     Objective:  Neurologic Exam  Physical Exam Physical Examination:   Filed Vitals:   11/25/13 1023  BP: 139/90  Pulse: 96  Temp: 98.3 F (36.8 C)     General Examination: The patient is a very pleasant 68 y.o. female in no acute distress. She appears well-developed and well-nourished and very well groomed.   HEENT: Normocephalic, atraumatic, pupils are equal, round and reactive to light and accommodation. Funduscopic exam is normal with sharp disc margins noted, s/p cataract repair on OD and slight cataract on OS.  Extraocular tracking is good without limitation to gaze excursion or nystagmus noted. Normal smooth pursuit is noted. Hearing is grossly intact. Face is symmetric with normal facial animation and normal facial sensation. Speech is clear with no dysarthria noted. There is no hypophonia. There is no lip, neck/head, jaw or voice tremor. Neck is supple with full range of passive and active motion. There are no carotid bruits on auscultation. Oropharynx exam reveals: mild mouth dryness, mild pharyngeal erythema, adequate dental hygiene and moderate airway crowding, due to large tongue, redundant soft palate and narrow airway entry. Mallampati is class III. Tongue protrudes centrally and palate elevates symmetrically. Tonsils are 1+. Neck size is 16.75 inches. Nasal inspection reveals severe nasal congestion and inferior turbinate hypertrophy, R>L and moderate erythema.  Chest: coarse breath sounds, no wheezing, rhonchi or crackles noted. She has some coughing bouts.   Heart: S1+S2+0, regular and normal without murmurs, rubs or gallops noted.  Abdomen: Soft, non-tender and non-distended with normal bowel sounds appreciated on auscultation.  Extremities: There is no pitting edema in the distal lower extremities bilaterally. Pedal pulses are intact.  Skin: Warm and dry without trophic changes noted. There are no varicose veins.  Musculoskeletal: exam reveals no obvious joint deformities, tenderness or joint swelling or erythema.   Neurologically:  Mental status: The patient is awake, alert and oriented in all 4 spheres. Her memory, attention, language and knowledge are appropriate. There is no aphasia, agnosia, apraxia or anomia. Speech is clear with normal prosody and enunciation. Thought process is linear. Mood is congruent and affect is normal.  Cranial nerves are as described above under HEENT exam. In addition, shoulder shrug is normal with equal shoulder height noted. Motor exam: Normal bulk,  strength and tone is noted. There is no drift, tremor or rebound. Romberg is negative. Reflexes are 2+ throughout. Toes are downgoing bilaterally. Fine motor skills are intact with normal finger taps, normal hand movements, normal rapid alternating patting, normal foot taps and normal foot agility.  Cerebellar testing shows no dysmetria or intention tremor on finger to nose testing. Heel to shin is unremarkable bilaterally. There is no truncal or gait ataxia.  Sensory exam is intact to light touch, pinprick, vibration, temperature sense in the upper and lower extremities.  Gait, station and balance are unremarkable. No veering to one side is noted. No leaning to one side is noted. Posture is age-appropriate and stance is narrow based. No problems turning are noted. She turns en bloc. Tandem walk is unremarkable. Intact toe and heel stance is noted.               Assessment and Plan:   In summary, HAYDYN LIDDELL is a very pleasant 68 y.o.-year old female with an underlying medical history of obesity, hypertension, prior smoking, and reflux disease, who presents for followup consultation of her obstructive sleep apnea. She is now established on CPAP treatment at a pressure of 6 cwp. Her physical exam is stable, but she has a cold, likely a viral common cold, perhaps a touch of bronchitis, but lungs sound clear. She did have some elevated temperatures and a fever of 100, 2 days ago. She will call Dr. Danna Hefty office again today. She does indicate good results with the use of CPAP, and good tolerance of the pressure and mask. I reviewed the compliance data as well as her 2 sleep study results in detail today and congratulated her on her great compliance. I encouraged her to continue to use CPAP regularly to help reduce cardiovascular risk.   We also talked about trying to maintaining a healthy lifestyle in general. I encouraged the patient to eat healthy, exercise daily and keep well hydrated, to keep a  scheduled bedtime and wake time routine, to not skip any meals and eat healthy snacks in between meals and to have protein with every meal. I stressed the importance of regular exercise.   I answered all her questions today and the patient was in agreement with the above outlined plan. I would like to see the patient back in 6 months, sooner if the need arises and encouraged her to call with any interim questions, concerns, problems or updates.  Most of my 25 minute visit today was spent in counseling and coordination of care, reviewing test results and reviewing medication.

## 2014-02-13 ENCOUNTER — Encounter: Payer: Self-pay | Admitting: Neurology

## 2014-02-17 NOTE — Progress Notes (Signed)
Quick Note:  I reviewed the patient's CPAP compliance data from 01/14/2014 to 02/12/2014, which is a total of 30 days, during which time the patient used CPAP every day. The average usage for all days was 7 hours and 20 minutes. The percent used days greater than 4 hours was 90 %, indicating excellent compliance. The residual AHI was 1.3 per hour, indicating an appropriate treatment pressure of 6 cwp with EPR of 3. Air leak from the mask was low at 6.9 L per minute at the 95th percentile. I will review this data with the patient at the next office visit, which is currently routinely scheduled for 05/28/2014 at 11:30 AM, provide feedback and additional troubleshooting if need be.  Huston FoleySaima Casanova Schurman, MD, PhD [facility] ([facility]   ______

## 2014-03-06 ENCOUNTER — Encounter: Payer: Self-pay | Admitting: Neurology

## 2014-03-26 ENCOUNTER — Ambulatory Visit: Payer: 59 | Admitting: Neurology

## 2014-05-15 ENCOUNTER — Encounter: Payer: Self-pay | Admitting: Neurology

## 2014-05-28 ENCOUNTER — Encounter: Payer: Self-pay | Admitting: Neurology

## 2014-05-28 ENCOUNTER — Ambulatory Visit (INDEPENDENT_AMBULATORY_CARE_PROVIDER_SITE_OTHER): Payer: Medicare PPO | Admitting: Neurology

## 2014-05-28 VITALS — BP 142/91 | HR 88 | Temp 98.5°F | Resp 12 | Ht 64.0 in | Wt 256.0 lb

## 2014-05-28 DIAGNOSIS — G4733 Obstructive sleep apnea (adult) (pediatric): Secondary | ICD-10-CM

## 2014-05-28 DIAGNOSIS — I10 Essential (primary) hypertension: Secondary | ICD-10-CM

## 2014-05-28 DIAGNOSIS — Z9989 Dependence on other enabling machines and devices: Principal | ICD-10-CM

## 2014-05-28 NOTE — Progress Notes (Signed)
Subjective:    Patient ID: Angelica Mcconnell is a 68 y.o. female.  HPI    Interim history:   Ms. Angelica Mcconnell is a very friendly 67 year old right-handed woman with an underlying medical history of obesity, hypertension, prior smoking, reflux disease, who presents for followup consultation of her obstructive sleep apnea treated with CPAP at a pressure of 6 cm with EPR of 3. The patient is unaccompanied today. I last saw her on 11/25/2013, at which time she reported sleeping better with CPAP, waking up better rested and less nocturia. I congratulated her on her great compliance and encouraged her to continue using CPAP regularly. Today, I reviewed her compliance data from 04/26/2014 through 05/25/2014 which is a total of 30 days during which time she used her CPAP every day. Percent used days greater than 4 hours was 97%, indicating excellent compliance, residual AHI low at 1.5 per hour, leak low at 6.9 L per minute at the 95th percentile, average usage of 7 hours and 36 minutes, pressure at 6 cm with EPR of 3.  Today, she reports doing well, she has no new symptoms. She would like to try the nasal pillows. She has allergy symptoms. She has overall done well with CPAP she feels. She is trying to lose weight. She went through a cleansing course of 10 days, this included juicing of vegetables and fruit. She lost about 5 pounds. However, she feels she regained most of it.  I first met her on 07/15/2013, at which time she reported loud snoring, nonrestorative sleep, morning headaches and daytime somnolence. She also reported bruxism, for which she was using an over-the-counter bite guard. I advised her to return for sleep study. She is at baseline sleep study followed by a CPAP titration study. I went over her test results in detail with her today. Her baseline sleep study from 08/05/2013 showed a sleep efficiency of 74.9% with a latency to sleep of 68.5 minutes and wake after sleep onset of 39 minutes with  mild to moderate sleep fragmentation noted. She had increased percentages of stage I and stage II sleep, I decreased percentage of slow-wave sleep and a mildly increased percentage of REM sleep with a normal REM latency. She had mild snoring with rare loud snoring noted. She had a total AHI of 5.4 per hour, rising to 17.4 per hour in REM sleep. Her baseline oxygen saturation was 92% with a nadir of 80%. Time below 88% saturation was 4 minutes and 4 seconds. She was requested to return for a CPAP titration study. She had this test on 09/11/2013. Sleep efficiency was 76.6% with a latency to sleep of 27 minutes and wake after sleep onset of 70 minutes with moderate sleep fragmentation noted. She had an increased percentage of stage II sleep, I decreased percentage of deep sleep, and a near normal percentage of REM sleep with a mildly reduced REM latency. She had occasional PVCs and PACs on EKG. Of note, during the baseline sleep study she had similar EKG changes. Snoring was eliminated with CPAP. She was started on 5 cm and titrated to 7 cm. On 6 cm of pressure her AHI was 0 per hour. Pre-supine REM sleep was achieved. Based on the test results I prescribed CPAP for her.  I reviewed the patient's CPAP compliance data from 10/15/2013 to 11/13/2013, which is a total of 30 days, during which time the patient used CPAP every day. The average usage for all days was 9 hours and 1 minutes. The percent  used days greater than 4 hours was 100%, indicating superb compliance. The residual AHI was 1.6 per hour, indicating an appropriate treatment pressure with very little leak documented.  I reviewed her compliance data from 10/14/2013 through 11/24/2013 which is the last 42 days during which times he use CPAP every night with percent used days greater than 4 hours of 100%, indicating superb compliance. Average usage was 9 hours and 3 minutes with a residual AHI of 1.5/h. Leak was very low. Pressure is 6 cm with EPR of 3.   I  reviewed the patient's CPAP compliance data from 01/14/2014 to 02/12/2014, which is a total of 30 days, during which time the patient used CPAP every day. The average usage for all days was 7 hours and 20 minutes. The percent used days greater than 4 hours was 90 %, indicating excellent compliance. The residual AHI was 1.3 per hour, indicating an appropriate treatment pressure of 6 cwp with EPR of 3. Air leak from the mask was low at 6.9 L per minute at the 95th percentile.  Her Past Medical History Is Significant For: Past Medical History  Diagnosis Date  . HTN (hypertension)   . Reflux   . Gout   . Sinus infection   . OSA (obstructive sleep apnea) 07/15/2013    Her Past Surgical History Is Significant For: No past surgical history on file.  Her Family History Is Significant For: Family History  Problem Relation Age of Onset  . Heart failure Mother   . Diabetes Mother   . Thyroid disease Mother     Her Social History Is Significant For: History   Social History  . Marital Status: Single    Spouse Name: [name]    Number of Children: [name]  . Years of Education: [name]   Social History Main Topics  . Smoking status: Former Smoker    Quit date: 07/15/1989  . Smokeless tobacco: None  . Alcohol Use: No  . Drug Use: No  . Sexual Activity: None   Other Topics Concern  . 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      Her Allergies Are:  No Known Allergies:   Her Current Medications Are:  Outpatient Encounter Prescriptions as of 05/28/2014  Medication Sig  . allopurinol (ZYLOPRIM) 100 MG tablet Take 1 tablet by mouth daily.  . fluticasone (FLONASE) 50 MCG/ACT nasal spray Place 1-2 sprays into both nostrils daily.  . irbesartan (AVAPRO) 300 MG tablet Take 1 tablet by mouth daily.  Marland Kitchen omeprazole (PRILOSEC) 20 MG capsule Take 1 capsule by mouth daily.  :  Review of Systems:  Out of a complete 14 point review of  systems, all are reviewed and negative with the exception of these symptoms as listed below:   Review of Systems  Allergic/Immunologic:       Runny nose.    Objective:  Neurologic Exam  Physical Exam Physical Examination:   Filed Vitals:   05/28/14 1144  BP: 142/91  Pulse: 88  Temp: 98.5 F (36.9 C)  Resp: 12    General Examination: The patient is a very pleasant 68 y.o. female in no acute distress. She appears well-developed and well-nourished and very well groomed.   HEENT: Normocephalic, atraumatic, pupils are equal, round and reactive to light and accommodation. Funduscopic exam is normal with sharp disc margins noted, s/p cataract repair on OD and mild cataract on OS. Extraocular tracking is  good without limitation to gaze excursion or nystagmus noted. Normal smooth pursuit is noted. Hearing is grossly intact. Face is symmetric with normal facial animation and normal facial sensation. Speech is clear with no dysarthria noted. There is no hypophonia. There is no lip, neck/head, jaw or voice tremor. Neck is supple with full range of passive and active motion. There are no carotid bruits on auscultation. Oropharynx exam reveals: mild mouth dryness, mild pharyngeal erythema, adequate dental hygiene and moderate airway crowding, due to large tongue, redundant soft palate and narrow airway entry. Mallampati is class III. Tongue protrudes centrally and palate elevates symmetrically. Tonsils are 1+.  Chest: coarse breath sounds, no wheezing, rhonchi or crackles noted. She has some coughing bouts.   Heart: S1+S2+0, regular and normal without murmurs, rubs or gallops noted.   Abdomen: Soft, non-tender and non-distended with normal bowel sounds appreciated on auscultation.  Extremities: There is no pitting edema in the distal lower extremities bilaterally. Pedal pulses are intact.  Skin: Warm and dry without trophic changes noted. There are no varicose veins.  Musculoskeletal: exam  reveals no obvious joint deformities, tenderness or joint swelling or erythema.   Neurologically:  Mental status: The patient is awake, alert and oriented in all 4 spheres. Her memory, attention, language and knowledge are appropriate. There is no aphasia, agnosia, apraxia or anomia. Speech is clear with normal prosody and enunciation. Thought process is linear. Mood is congruent and affect is normal.  Cranial nerves are as described above under HEENT exam. In addition, shoulder shrug is normal with equal shoulder height noted. Motor exam: Normal bulk, strength and tone is noted. There is no drift, tremor or rebound. Romberg is negative. Reflexes are 2+ throughout. Toes are downgoing bilaterally. Fine motor skills are intact with normal finger taps, normal hand movements, normal rapid alternating patting, normal foot taps and normal foot agility.  Cerebellar testing shows no dysmetria or intention tremor on finger to nose testing. Heel to shin is unremarkable bilaterally. There is no truncal or gait ataxia.  Sensory exam is intact to light touch, pinprick, vibration, temperature sense in the upper and lower extremities.  Gait, station and balance are unremarkable. No veering to one side is noted. No leaning to one side is noted. Posture is age-appropriate and stance is narrow based. No problems turning are noted. She turns en bloc. Tandem walk is unremarkable.           Assessment and Plan:   In summary, TERRENCE PIZANA is a very pleasant 68 year old female with an underlying medical history of obesity, hypertension, prior smoking, and reflux disease, who presents for followup consultation of her obstructive sleep apnea. She has been using CPAP treatment at a pressure of 6 cwp with good results and full compliance. Her physical exam is stable. She does indicate good results with the use of CPAP, and good tolerance of the pressure and mask, however, she would like to try the nasal pillows, which I will  order. I reviewed the compliance data with her today and congratulated her on her great compliance. I encouraged her to continue to use CPAP regularly to help reduce cardiovascular risk and continue to maintain her symptoms.   We also talked about trying to maintaining a healthy lifestyle in general. I encouraged the patient to eat healthy, exercise daily and keep well hydrated, to keep a scheduled bedtime and wake time routine, to not skip any meals and eat healthy snacks in between meals and to have protein with  every meal. I stressed the importance of regular exercise.   I answered all her questions today and the patient was in agreement with the above outlined plan. I would like to see the patient back in 12 months, sooner if the need arises and encouraged her to call with any interim questions, concerns, problems or updates.

## 2014-05-28 NOTE — Patient Instructions (Signed)
We will try a nasal pillows mask.  Please continue using your CPAP regularly. While your insurance requires that you use CPAP at least 4 hours each night on 70% of the nights, I recommend, that you not skip any nights and use it throughout the night if you can. Getting used to CPAP and staying with the treatment long term does take time and patience and discipline. Untreated obstructive sleep apnea when it is moderate to severe can have an adverse impact on cardiovascular health and raise her risk for heart disease, arrhythmias, hypertension, congestive heart failure, stroke and diabetes. Untreated obstructive sleep apnea causes sleep disruption, nonrestorative sleep, and sleep deprivation. This can have an impact on your day to day functioning and cause daytime sleepiness and impairment of cognitive function, memory loss, mood disturbance, and problems focussing. Using CPAP regularly can improve these symptoms.  Keep up the good work! I will see you back in 12 months for sleep apnea check up.

## 2014-05-30 ENCOUNTER — Inpatient Hospital Stay
Admit: 2014-05-30 | Discharge: 2014-06-01 | Disposition: A | Discharge status: Home / Self Care | Payer: MEDICARE | Attending: Internal Medicine | Admitting: Internal Medicine

## 2014-05-30 DIAGNOSIS — G40909 Epilepsy, unspecified, not intractable, without status epilepticus: Principal | ICD-10-CM

## 2014-05-30 LAB — BLOOD GAS, ARTERIAL
BASE DEFICIT: 7.4 mmol/L
BICARBONATE: 21 mmol/L — ABNORMAL LOW (ref 22–26)
O2 SAT: 95 % (ref 92–97)
PCO2: 52 mmHg — ABNORMAL HIGH (ref 35–45)
PO2: 91 mmHg (ref 80–100)
SPONTANEOUS RATE: 16
pH: 7.22 — CL (ref 7.35–7.45)

## 2014-05-30 LAB — URINALYSIS W/MICROSCOPIC
Bacteria: NEGATIVE /hpf
Bilirubin: NEGATIVE
Blood: NEGATIVE
Glucose: NEGATIVE mg/dL
Ketone: NEGATIVE mg/dL
Leukocyte Esterase: NEGATIVE
Nitrites: NEGATIVE
Protein: NEGATIVE mg/dL
Specific gravity: 1.015 (ref 1.003–1.030)
Urobilinogen: 0.2 EU/dL (ref 0.2–1.0)
pH (UA): 7 (ref 5.0–8.0)

## 2014-05-30 LAB — METABOLIC PANEL, COMPREHENSIVE
A-G Ratio: 1.1 (ref 1.1–2.2)
ALT (SGPT): 22 U/L (ref 12–78)
AST (SGOT): 16 U/L (ref 15–37)
Albumin: 3.8 g/dL (ref 3.5–5.0)
Alk. phosphatase: 48 U/L (ref 45–117)
Anion gap: 9 mmol/L (ref 5–15)
BUN/Creatinine ratio: 10 — ABNORMAL LOW (ref 12–20)
BUN: 12 MG/DL (ref 6–20)
Bilirubin, total: 0.2 MG/DL (ref 0.2–1.0)
CO2: 20 mmol/L — ABNORMAL LOW (ref 21–32)
Calcium: 8.6 MG/DL (ref 8.5–10.1)
Chloride: 111 mmol/L — ABNORMAL HIGH (ref 97–108)
Creatinine: 1.24 MG/DL — ABNORMAL HIGH (ref 0.55–1.02)
GFR est AA: 52 mL/min/{1.73_m2} — ABNORMAL LOW (ref >60)
GFR est non-AA: 43 mL/min/{1.73_m2} — ABNORMAL LOW (ref >60)
Globulin: 3.4 g/dL (ref 2.0–4.0)
Glucose: 198 mg/dL — ABNORMAL HIGH (ref 65–100)
Potassium: 3.6 mmol/L (ref 3.5–5.1)
Protein, total: 7.2 g/dL (ref 6.4–8.2)
Sodium: 140 mmol/L (ref 136–145)

## 2014-05-30 LAB — CK W/ CKMB & INDEX
CK - MB: 1.3 NG/ML (ref 0.5–3.6)
CK-MB Index: 0.8 (ref 0–2.5)
CK: 154 U/L (ref 26–192)

## 2014-05-30 LAB — CBC WITH AUTOMATED DIFF
ABS. BASOPHILS: 0 10*3/uL (ref 0.0–0.1)
ABS. EOSINOPHILS: 0.1 10*3/uL (ref 0.0–0.4)
ABS. LYMPHOCYTES: 3.8 10*3/uL — ABNORMAL HIGH (ref 0.8–3.5)
ABS. MONOCYTES: 0.7 10*3/uL (ref 0.0–1.0)
ABS. NEUTROPHILS: 2.4 10*3/uL (ref 1.8–8.0)
BASOPHILS: 0 % (ref 0–1)
EOSINOPHILS: 2 % (ref 0–7)
HCT: 38 % (ref 35.0–47.0)
HGB: 12.2 g/dL (ref 11.5–16.0)
LYMPHOCYTES: 55 % — ABNORMAL HIGH (ref 12–49)
MCH: 31 PG (ref 26.0–34.0)
MCHC: 32.1 g/dL (ref 30.0–36.5)
MCV: 96.4 FL (ref 80.0–99.0)
MONOCYTES: 10 % (ref 5–13)
NEUTROPHILS: 33 % (ref 32–75)
PLATELET: 333 10*3/uL (ref 150–400)
RBC: 3.94 M/uL (ref 3.80–5.20)
RDW: 14 % (ref 11.5–14.5)
WBC: 7.1 10*3/uL (ref 3.6–11.0)

## 2014-05-30 LAB — GLUCOSE, POC: Glucose (POC): 176 mg/dL — ABNORMAL HIGH (ref 65–100)

## 2014-05-30 LAB — LACTIC ACID: Lactic acid: 2.1 MMOL/L — ABNORMAL HIGH (ref 0.4–2.0)

## 2014-05-30 LAB — PHENYTOIN: Phenytoin: <0.5 ug/mL — ABNORMAL LOW (ref 10.0–20.0)

## 2014-05-30 LAB — TROPONIN I: Troponin-I, Qt.: <0.04 ng/mL (ref <0.05)

## 2014-05-30 LAB — ETHYL ALCOHOL: ALCOHOL(ETHYL),SERUM: <10 MG/DL (ref <10)

## 2014-05-30 LAB — MAGNESIUM: Magnesium: 1.9 mg/dL (ref 1.6–2.4)

## 2014-05-30 MED ORDER — LORAZEPAM 2 MG/ML IJ SOLN
2 mg/mL | INTRAMUSCULAR | Status: AC
Start: 2014-05-30 — End: 2014-05-30
  Administered 2014-05-30

## 2014-05-30 MED FILL — LORAZEPAM 2 MG/ML IJ SOLN: 2 mg/mL | INTRAMUSCULAR | Qty: 1

## 2014-05-30 NOTE — ED Provider Notes (Addendum)
HPI Comments: Meghan FuchsBarbara J Diamant is a 68 y.o. female w/ hx significant for seizure who presents via EMS [facility] ED after her friend found her unresponsive and displaying snoring respirations PTA. Per EMS, pt's sats were 70-75% on RA on arrival. Pt was not given meds en route. Pt was placed on non-rebreather and her sats improved to 100%.    Patient is a poor historian due to condition.     PCP: Tenna Delaineheryl C Belle, MD    PMhx is significant for: Seizures, HTN, Depression  SMhx is significant for: Hysterectomy  Social hx:                  + Tobacco                 - EtOH      - Illicit Drugs    There are no other complaints, changes or physical findings at this time.  Written by SwazilandJordan R Hawkins, ED Scribe, as dictated by Brigid ReJohn J Salley, MD.    The history is provided by the EMS personnel. The history is limited by the condition of the patient.        Past Medical History   Diagnosis Date   ??? Seizures (HCC)    ??? Hypertension    ??? Psychiatric disorder      depression        Past Surgical History   Procedure Laterality Date   ??? Hx gyn       hysterectomy         No family history on file.     History     Social History   ??? Marital Status: SINGLE     Spouse Name: [name]     Number of Children: [name]   ??? Years of Education: [name]     Occupational History   ??? Not on file.     Social History Main Topics   ??? Smoking status: Current Every Day Smoker   ??? Smokeless tobacco: Not on file   ??? Alcohol Use: No   ??? Drug Use: Not on file   ??? Sexual Activity: Not on file     Other Topics Concern   ??? Not on file     Social History Narrative   ??? No narrative on file                  ALLERGIES: Review of patient's allergies indicates no known allergies.      Review of Systems   Unable to perform ROS: Acuity of condition       Filed Vitals:    05/30/14 1703 05/30/14 1705   BP: 140/52    Pulse: 101    Resp: 16    SpO2: 85% 100%            Physical Exam   Constitutional: She appears well-developed and well-nourished. No distress.   HENT:    Head: Normocephalic and atraumatic.   Mouth/Throat: Oropharynx is clear and moist.   Hearing aids in both ears   Eyes: Conjunctivae and EOM are normal. No scleral icterus.   Pupils 2 mm bilaterally, reactive   Neck: Normal range of motion. Neck supple.   Cardiovascular: Normal rate and regular rhythm.  Exam reveals no gallop.    No murmur heard.  Pulmonary/Chest: No stridor. She has no wheezes. She has no rales.   Obtunded with snoring respirations, bilateral Rhonchi    Abdominal: Soft. Bowel sounds are normal. She exhibits  no distension and no mass. There is no tenderness. There is no rebound and no guarding.   Musculoskeletal: Normal range of motion. She exhibits no edema.   Lymphadenopathy:     She has no cervical adenopathy.   Neurological:   Withdraws from painful stimuli   Skin: Skin is warm and dry. No rash noted. No erythema.   Nursing note and vitals reviewed.  Written by SwazilandJordan R Hawkins, ED Scribe as dictated by Brigid ReJohn J Salley, MD.       MDM  Number of Diagnoses or Management Options  Convulsions, unspecified convulsion type [facility](HCC):   Diagnosis management comments: DDx: Seizure, Anoxia, Metabolic abnormailty       Amount and/or Complexity of Data Reviewed  Clinical lab tests: ordered and reviewed  Tests in the radiology section of CPT??: ordered and reviewed  Tests in the medicine section of CPT??: ordered and reviewed  Obtain history from someone other than the patient: yes (EMS)  Review and summarize past medical records: yes  Independent visualization of images, tracings, or specimens: yes    Critical Care  Total time providing critical care: 30-74 minutes    Patient Progress  Patient progress: stable      Procedures    EKG interpretation: (Preliminary)  Rhythm: sinus tachycardia; and regular . Rate (approx.): 102; Axis: normal; P wave: normal; QRS interval: normal ; ST/T wave: non-specific changes; Other findings: borderline ekg.      7:56 PM   Pt was slowly becoming more responsive, then had a witnessed tonic-clonic seizure lasting about 3 minutes. Per her daughter who is now present, [facility], who last increased her med doses in February of this year, and she has not had any seizures since then. Per daughter pt is very compliant with meds, and doesn't believe her mother would have missed any (though phenytoin is sub therapeutic). Will give IV Dilantin, consider admission.  Julianne HandlerJohn J Salley, Jr, MD      8:05 PM  Julianne HandlerJohn J Salley, Jr, MD spoke with Dr. Skeet Latchiesla, Consult for Hospitalist. Discussed available diagnostic tests and clinical findings. He is in agreement with care plans as outlined. He/she will admit.

## 2014-05-30 NOTE — ED Notes (Signed)
RN Sundra Alandefe and Annette StableBill, Paramedic called to room for pt seizing. Pt with grand mal seizure per RN and Paramedic. Pt with sats that dropped to upper 70s, placed on NRB and pt given ativan. Dr. Daleen BoSalley called to bedside. Per paramedic, seizure lasted approximately 3 minutes, eyes deviated to upper right side. Pt repositioned for comfort and suctioned of secretions. Pt with NRB on, sating 100%. Daughter at bedside, updated on plan of care.
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
## 2014-05-30 NOTE — ED Notes (Addendum)
EMS reports patient found by friend unresponsive, h/o seizures.  EMS advises patient responding to pain only, no seizure activity enroute to ED.  Patient now withdrawing from pain, snoring respirations, O2 sats 79% on room air, non-rebreather placed.  Patient incontinent, clothing removed.  Patient's jewlery, 2 pairs of gold-colored earrings and one gold necklace, removed, placed in biohazard bag with 72 cents and placed in patient belonging bag with clothing.  Patient suctioned for a small amount of clear white mucous.  Chewing gum removed from patient's mouth.

## 2014-05-30 NOTE — ED Notes (Signed)
TRANSFER - OUT REPORT:    Verbal report given to TongaVanessa, RN(name) on Meghan [name]  being transferred to Neuro (unit) for routine progression of care       Report consisted of patient???s Situation, Background, Assessment and   Recommendations(SBAR).     Information from the following report(s) SBAR, ED Summary, [facility] and Recent Results was reviewed with the receiving nurse.    Lines:   Peripheral IV 05/30/14 Left Hand (Active)        Opportunity for questions and clarification was provided.      Patient transported with:   Monitor  O2 @ 10 liters  Registered Nurse

## 2014-05-30 NOTE — Progress Notes (Signed)
Responded to request from RN Revonda Standardllison to offer support to Meghan [name] in ER 37, that she may be coding.  Remained present as staff worked on her. Meghan [name] is unable to be assessed due to medical condition.  There is no family present.  Family will be located and staff to page chaplain if/when they are located and come to the hospital.  Advised of chaplain availability.  Rev. Pollyann Samplesebbie Carlton, PRN Chaplain  [phone]  [phone]

## 2014-05-30 NOTE — ED Notes (Signed)
Patient opened eyes and flailing arms with sternal rub.

## 2014-05-30 NOTE — Progress Notes (Signed)
TRANSFER - IN REPORT:    Verbal report received from BuchananKristi, Charity fundraiserN (name) on Christiana FuchsBarbara J Lipson  being received from ED(unit) for routine progression of care      Report consisted of patient???s Situation, Background, Assessment and   Recommendations(SBAR).

## 2014-05-30 NOTE — ED Notes (Signed)
Dr. Jodie Echevariaan at bedside for pt evaluation.

## 2014-05-30 NOTE — ED Notes (Addendum)
Patient taken to CT on cardiac monitor by Velia Meyerobin Donnette Macmullen, RN.  Patient now opening eyes to name called, no command following, no verbal response, moving all extremities.  Patient's daughter at bedside, advises she found patient unresponsive and this is how patient behaves after a seizure.  Daughter advises post-ictal "hours to days."  Patient's bilateral hearing aides removed prior to CT, placed in biohazard bag and given to patient's daughter. Daughter advises patient's last seizure in February 2015.

## 2014-05-30 NOTE — Progress Notes (Signed)
Primary Nurse Cleophas DunkerVanessa S Lawson, RN and Marcelino DusterMichelle, RN performed a dual skin assessment on this patient No impairment noted.  Braden score is 15

## 2014-05-30 NOTE — ED Notes (Signed)
Dr. Salley at bedside evaluating patient.

## 2014-05-30 NOTE — ED Notes (Signed)
Dr. Daleen BoSalley at bedside evaluating patient. Patient now responding to sternal rub, opening eyes and localizing pain.

## 2014-05-30 NOTE — ED Notes (Signed)
Patient now not opening eyes and not responding to sternal rub, VS stable, discussed with Dr. Daleen BoSalley who will evaluate.

## 2014-05-31 LAB — DRUG SCREEN, URINE
AMPHETAMINES: NEGATIVE
BARBITURATES: NEGATIVE
BENZODIAZEPINES: NEGATIVE
COCAINE: NEGATIVE
METHADONE: NEGATIVE
OPIATES: NEGATIVE
PCP(PHENCYCLIDINE): NEGATIVE
THC (TH-CANNABINOL): NEGATIVE

## 2014-05-31 LAB — EKG, 12 LEAD, INITIAL
Atrial Rate: 102 {beats}/min
Calculated P Axis: 73 degrees
Calculated R Axis: 75 degrees
Calculated T Axis: 59 degrees
P-R Interval: 156 ms
Q-T Interval: 340 ms
QRS Duration: 86 ms
QTC Calculation (Bezet): 443 ms
Ventricular Rate: 102 {beats}/min

## 2014-05-31 LAB — METABOLIC PANEL, BASIC
Anion gap: 8 mmol/L (ref 5–15)
BUN/Creatinine ratio: 10 — ABNORMAL LOW (ref 12–20)
BUN: 10 MG/DL (ref 6–20)
CO2: 20 mmol/L — ABNORMAL LOW (ref 21–32)
Calcium: 8.4 MG/DL — ABNORMAL LOW (ref 8.5–10.1)
Chloride: 115 mmol/L — ABNORMAL HIGH (ref 97–108)
Creatinine: 0.96 MG/DL (ref 0.55–1.02)
GFR est AA: >60 mL/min/{1.73_m2} (ref >60)
GFR est non-AA: 58 mL/min/{1.73_m2} — ABNORMAL LOW (ref >60)
Glucose: 84 mg/dL (ref 65–100)
Potassium: 3.6 mmol/L (ref 3.5–5.1)
Sodium: 143 mmol/L (ref 136–145)

## 2014-05-31 LAB — MAGNESIUM: Magnesium: 1.9 mg/dL (ref 1.6–2.4)

## 2014-05-31 MED ORDER — TOPIRAMATE 100 MG TAB
100 mg | Freq: Two times a day (BID) | ORAL | Status: DC
Start: 2014-05-31 — End: 2014-06-01
  Administered 2014-05-31 – 2014-06-01 (×3): via ORAL

## 2014-05-31 MED ORDER — ASPIRIN 81 MG CHEWABLE TAB
81 mg | Freq: Every day | ORAL | Status: DC
Start: 2014-05-31 — End: 2014-06-01
  Administered 2014-05-31 – 2014-06-01 (×2): via ORAL

## 2014-05-31 MED ORDER — FLU VACCINE QV 2015-16 (36 MOS+)(PF) 60 MCG(15 MCGX4)/0.5 ML IM SYRINGE
60 mcg (15 mcg x 4)/0.5 mL | INTRAMUSCULAR | Status: AC
Start: 2014-05-31 — End: 2014-05-31
  Administered 2014-05-31: 13:00:00 via INTRAMUSCULAR

## 2014-05-31 MED ORDER — ACETAMINOPHEN 650 MG RECTAL SUPPOSITORY
650 mg | RECTAL | Status: DC | PRN
Start: 2014-05-31 — End: 2014-06-01

## 2014-05-31 MED ORDER — LACOSAMIDE 100 MG TAB
100 mg | Freq: Two times a day (BID) | ORAL | Status: DC
Start: 2014-05-31 — End: 2014-06-01
  Administered 2014-05-31 – 2014-06-01 (×3): via ORAL

## 2014-05-31 MED ORDER — PHENYTOIN SODIUM 50 MG/ML IV
50 mg/mL | Freq: Three times a day (TID) | INTRAVENOUS | Status: DC
Start: 2014-05-31 — End: 2014-06-01
  Administered 2014-05-31 – 2014-06-01 (×4): via INTRAVENOUS

## 2014-05-31 MED ORDER — ACETAMINOPHEN 325 MG TABLET
325 mg | ORAL | Status: DC | PRN
Start: 2014-05-31 — End: 2014-06-01

## 2014-05-31 MED ORDER — ONDANSETRON (PF) 4 MG/2 ML INJECTION
4 mg/2 mL | INTRAMUSCULAR | Status: DC | PRN
Start: 2014-05-31 — End: 2014-06-01

## 2014-05-31 MED ORDER — PHENYTOIN SODIUM 50 MG/ML IV
50 mg/mL | INTRAVENOUS | Status: AC
Start: 2014-05-31 — End: 2014-05-30
  Administered 2014-05-31: 01:00:00 via INTRAVENOUS

## 2014-05-31 MED ORDER — NICOTINE 14 MG/24 HR DAILY PATCH
14 mg/24 hr | TRANSDERMAL | Status: DC
Start: 2014-05-31 — End: 2014-06-01

## 2014-05-31 MED ORDER — CITALOPRAM 20 MG TAB
20 mg | Freq: Every day | ORAL | Status: DC
Start: 2014-05-31 — End: 2014-06-01
  Administered 2014-05-31 – 2014-06-01 (×2): via ORAL

## 2014-05-31 MED ORDER — LORAZEPAM 2 MG/ML IJ SOLN
2 mg/mL | INTRAMUSCULAR | Status: DC | PRN
Start: 2014-05-31 — End: 2014-06-01

## 2014-05-31 MED ORDER — NS WITH POTASSIUM CHLORIDE 20 MEQ/L IV
20 mEq/L | INTRAVENOUS | Status: DC
Start: 2014-05-31 — End: 2014-06-01
  Administered 2014-05-31 (×2): via INTRAVENOUS

## 2014-05-31 MED FILL — CITALOPRAM 20 MG TAB: 20 mg | ORAL | Qty: 1

## 2014-05-31 MED FILL — PHENYTOIN SODIUM 50 MG/ML IV: 50 mg/mL | INTRAVENOUS | Qty: 2

## 2014-05-31 MED FILL — NICOTINE 14 MG/24 HR DAILY PATCH: 14 mg/24 hr | TRANSDERMAL | Qty: 1

## 2014-05-31 MED FILL — VIMPAT 100 MG TABLET: 100 mg | ORAL | Qty: 1

## 2014-05-31 MED FILL — FLUARIX QUAD 2015-2016 (PF) 60 MCG (15 MCG X 4)/0.5 ML IM SYRINGE: 60 mcg (15 mcg x 4)/0.5 mL | INTRAMUSCULAR | Qty: 0.5

## 2014-05-31 MED FILL — PHENYTOIN SODIUM 50 MG/ML IV: 50 mg/mL | INTRAVENOUS | Qty: 20

## 2014-05-31 MED FILL — NS WITH POTASSIUM CHLORIDE 20 MEQ/L IV: 20 mEq/L | INTRAVENOUS | Qty: 1000

## 2014-05-31 MED FILL — TOPIRAMATE 100 MG TAB: 100 mg | ORAL | Qty: 2

## 2014-05-31 MED FILL — CHILDREN'S ASPIRIN 81 MG CHEWABLE TABLET: 81 mg | ORAL | Qty: 1

## 2014-05-31 NOTE — Progress Notes (Signed)
Bedside shift change report given to Vanessa (oncoming nurse) by Taylor (offgoing nurse). Report included the following information SBAR, Kardex, MAR and Recent Results.

## 2014-05-31 NOTE — Progress Notes (Signed)
[id number]: Pt sat up in bed and was able to state her name, DOB, and her reason for being in the hospital.  Pt had to be reminded of what hospital she was in; got up to bedside commode w/assist after refusing to use the bedpan; suction and oxygen at bedside; Daughter also at bedside.  Will continue to monitor.
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
## 2014-05-31 NOTE — Progress Notes (Signed)
Bedside and Verbal shift change report given to Taylor, RN (oncoming nurse) by Vanessa, RN (offgoing nurse). Report included the following information SBAR, Kardex and MAR.

## 2014-05-31 NOTE — Consults (Signed)
dictated
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
## 2014-05-31 NOTE — Progress Notes (Signed)
Quick Note:  I reviewed the patient's CPAP compliance data from 04/15/2014 to 05/14/2014, which is a total of 30 days, during which time the patient used CPAP every day. The average usage for all days was 8 hours and 3 minutes. The percent used days greater than 4 hours was 100 %, indicating superb compliance. The residual AHI was 1.3 per hour, indicating an appropriate treatment pressure of 6 cwp with EPR of 3. Air leak from the mask was low in the 95th percentile of leak was 7.2 L per minute. I will review this data with the patient at the next office visit, which is currently routinely scheduled for 06/01/2015 at 11 AM, provide feedback and additional troubleshooting if need be.  Huston FoleySaima Urania Pearlman, MD, PhD [facility]   ______

## 2014-06-01 MED ORDER — PHENYTOIN SODIUM EXTENDED 300 MG CAP
300 mg | ORAL_CAPSULE | Freq: Every day | ORAL | Status: DC
Start: 2014-06-01 — End: 2016-12-21

## 2014-06-01 MED ORDER — PHENYTOIN SODIUM EXTENDED 100 MG CAP
100 mg | Freq: Once | ORAL | Status: AC
Start: 2014-06-01 — End: 2014-06-01
  Administered 2014-06-01: 14:00:00 via ORAL

## 2014-06-01 MED ORDER — PHENYTOIN SODIUM EXTENDED 100 MG CAP
100 mg | Freq: Every day | ORAL | Status: DC
Start: 2014-06-01 — End: 2014-06-01

## 2014-06-01 MED FILL — VIMPAT 100 MG TABLET: 100 mg | ORAL | Qty: 1

## 2014-06-01 MED FILL — PHENYTOIN SODIUM 50 MG/ML IV: 50 mg/mL | INTRAVENOUS | Qty: 2

## 2014-06-01 MED FILL — TOPIRAMATE 100 MG TAB: 100 mg | ORAL | Qty: 2

## 2014-06-01 MED FILL — NICOTINE 14 MG/24 HR DAILY PATCH: 14 mg/24 hr | TRANSDERMAL | Qty: 1

## 2014-06-01 MED FILL — CITALOPRAM 20 MG TAB: 20 mg | ORAL | Qty: 1

## 2014-06-01 MED FILL — CHILDREN'S ASPIRIN 81 MG CHEWABLE TABLET: 81 mg | ORAL | Qty: 1

## 2014-06-01 MED FILL — PHENYTOIN SODIUM EXTENDED 100 MG CAP: 100 mg | ORAL | Qty: 2

## 2014-06-01 NOTE — Progress Notes (Signed)
Pressure Ulcer Prevention In basket Alert Received for Braden < 14.    Chart reviewed by the Wound Care Team.     1. Please assess patient and initiate pressure ulcer prevention and document the Prevention methods initiated including floating heels, turning and bed assessment. The "prevention" is found in simple assessment flow sheet.      2. Please individualize Care Plan as further assessment data is obtained and Interventions initiated. Document any education on Pressure ulcer prevention done with the patient and Family.     3. Document any wounds completely, using the wound care LDA for each wound found. Consult the Wound Care Team for any pressure ulcers noted on assessment.      4. If a pressure relieving mattress/bed (ex. CCU's Total Care beds or Progressa beds, Orthopedics??? Primeaire mattresses, ACCUMAX mattress or Versacare bed) is not available in the hospital, consult the Wound Care Team to evaluate the need for a specialty bed.     5. CCU staff nurse to refer their Bed Protocol for specialty bed selection.    The Wound Care Team

## 2014-06-01 NOTE — Progress Notes (Signed)
RE: Phenytoin Dosing    The patient has been receiving Phenytoin IV [id number]$  q8h. Pharmacy was asked to convert this to PO dosing to facilitate discharge. I have switched the patient to Phenytoin ER 300mg  PO daily. IV Phenytoin can be converted to PO at the same dose and when using the extended-release capsules, it can be given once daily.    Thank you,  Anastasia PallJoshua P Crawford, [facility]MD

## 2014-06-01 NOTE — Other (Signed)
Removed IV, CDI.  Reviewed discharge instructions, medications, and follow up orders with patient and daughter.  Personal belongings gathered and taken down with patient by volunteer.
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
## 2014-06-01 NOTE — Progress Notes (Signed)
Bedside and Verbal shift change report given to Trish, RN (oncoming nurse) by Vanessa, RN (offgoing nurse). Report included the following information SBAR, Kardex and MAR.

## 2014-06-02 LAB — TOPIRAMATE: Topiramate: 14 ug/mL (ref 2.0–25.0)

## 2014-06-03 ENCOUNTER — Inpatient Hospital Stay
Admit: 2014-06-03 | Discharge: 2014-06-05 | Disposition: A | Discharge status: Home / Self Care | Payer: MEDICARE | Attending: Allergy & Immunology | Admitting: Allergy & Immunology

## 2014-06-03 DIAGNOSIS — I808 Phlebitis and thrombophlebitis of other sites: Secondary | ICD-10-CM

## 2014-06-03 LAB — CBC WITH AUTOMATED DIFF
ABS. BASOPHILS: 0 10*3/uL (ref 0.0–0.1)
ABS. EOSINOPHILS: 0.2 10*3/uL (ref 0.0–0.4)
ABS. LYMPHOCYTES: 2.5 10*3/uL (ref 0.8–3.5)
ABS. MONOCYTES: 1 10*3/uL (ref 0.0–1.0)
ABS. NEUTROPHILS: 6.4 10*3/uL (ref 1.8–8.0)
BASOPHILS: 0 % (ref 0–1)
EOSINOPHILS: 2 % (ref 0–7)
HCT: 37.5 % (ref 35.0–47.0)
HGB: 12.2 g/dL (ref 11.5–16.0)
LYMPHOCYTES: 25 % (ref 12–49)
MCH: 30.3 PG (ref 26.0–34.0)
MCHC: 32.5 g/dL (ref 30.0–36.5)
MCV: 93.1 FL (ref 80.0–99.0)
MONOCYTES: 10 % (ref 5–13)
NEUTROPHILS: 63 % (ref 32–75)
PLATELET: 294 10*3/uL (ref 150–400)
RBC: 4.03 M/uL (ref 3.80–5.20)
RDW: 13.8 % (ref 11.5–14.5)
WBC: 10.1 10*3/uL (ref 3.6–11.0)

## 2014-06-03 MED ORDER — LACOSAMIDE 100 MG TAB
100 mg | ORAL | Status: AC
Start: 2014-06-03 — End: 2014-06-03
  Administered 2014-06-04: 01:00:00 via ORAL

## 2014-06-03 MED ORDER — SODIUM CHLORIDE 0.9 % IJ SYRG
INTRAMUSCULAR | Status: DC | PRN
Start: 2014-06-03 — End: 2014-06-05

## 2014-06-03 MED ORDER — TOPIRAMATE 100 MG TAB
100 mg | ORAL | Status: AC
Start: 2014-06-03 — End: 2014-06-03
  Administered 2014-06-04: 01:00:00 via ORAL

## 2014-06-03 MED FILL — VIMPAT 100 MG TABLET: 100 mg | ORAL | Qty: 1

## 2014-06-03 MED FILL — TOPIRAMATE 100 MG TAB: 100 mg | ORAL | Qty: 1

## 2014-06-03 NOTE — ED Notes (Signed)
Pt remains in ultrasound at this time.

## 2014-06-03 NOTE — ED Notes (Addendum)
Patient agitated and becoming verbally aggressive while RN drawing labs. Patient is alert and oriented x3 with some periodic confusion. Daughter is at bedside, patient keeps referring to her as a "friend of the family." Daughter says that she does occasionally have some confusion any time that adjustments are made to her seizure medications. Dr. Gelene MinkFrederick made aware and at bedside evaluating patient.

## 2014-06-03 NOTE — ED Provider Notes (Addendum)
HPI Comments: Meghan [name] is a 68 y.o. R handed female who presents with family member to [facility] ED C/O progressively worsening diffuse L forearm and L hand swelling, redness, and pain with additional sx's of chills and decreased appetite x 05/31/2014. Pt states she was admitted to [facility] from 05/30/2014 to 06/01/2014 for evaluation of seizure-like activity. Pt states during her stay at [facility] she had IV placed in LUE with onset of sx's shortly after. Pt also reports being out of her seizure medications. Pt specifically denies CP, SOB, cough, N/V/D, constipation, abd pain, urinary changes, fever.     PCP: Evangeline Gula, MD    PMhx is significant for: Borderline DM, Seizures, HTN, Depression   SMhx is significant for: Hysterectomy  Social hx: + Smoke, - EtOH, - Illicit Drugs    There are no other complaints, changes or physical findings at this time.  Written by Payton Mccallum, ED Scribe, as dictated by Reynaldo Minium, MD      The history is provided by the patient.        Past Medical History   Diagnosis Date   ??? Seizures ([place])    ??? Hypertension    ??? Psychiatric disorder      depression        Past Surgical History   Procedure Laterality Date   ??? Hx gyn       hysterectomy         History reviewed. No pertinent family history.     History     Social History   ??? Marital Status: SINGLE     Spouse Name: [name]     Number of Children: [name]   ??? Years of Education: [name]     Occupational History   ??? Not on file.     Social History Main Topics   ??? Smoking status: Current Every Day Smoker   ??? Smokeless tobacco: Not on file   ??? Alcohol Use: No   ??? Drug Use: Not on file   ??? Sexual Activity: Not on file     Other Topics Concern   ??? Not on file     Social History Narrative                  ALLERGIES: Keppra      Review of Systems   Constitutional: Positive for chills and appetite change (decreased appetite ). Negative for fever, diaphoresis and unexpected weight change.   HENT: Negative for rhinorrhea and sore throat.     Eyes: Negative for pain.   Respiratory: Negative for cough, shortness of breath, wheezing and stridor.    Cardiovascular: Negative for chest pain and leg swelling.   Gastrointestinal: Negative for nausea, vomiting, abdominal pain, diarrhea, constipation and blood in stool.   Genitourinary: Negative for dysuria, flank pain and difficulty urinating.   Musculoskeletal: Positive for myalgias (L forearm and L hand pain) and joint swelling (Swelling over entire L forearm and L hand). Negative for back pain and neck stiffness.   Skin: Positive for color change (redness to L forearm and L hand). Negative for rash and wound.   Neurological: Negative for seizures, syncope, weakness, light-headedness and headaches.   Psychiatric/Behavioral: Negative for confusion.       Filed Vitals:    06/03/14 1754 06/03/14 2106 06/03/14 2232   BP: 140/88 135/71 108/58   Pulse: 106 86 86   Temp: 98.8 ??F (37.1 ??C)     Resp: '18 18 18   ' Height:  '5\' 4"'  (1.626 m)     Weight: 81.2 kg (179 lb 0.2 oz)     SpO2: 98% 98% 98%            Physical Exam   Constitutional: She is oriented to person, place, and time. She appears well-developed and well-nourished. No distress.   HENT:   Nose: Nose normal.   Mouth/Throat: Oropharynx is clear and moist. No oropharyngeal exudate.   Eyes: Conjunctivae and EOM are normal. Pupils are equal, round, and reactive to light. Right eye exhibits no discharge. Left eye exhibits no discharge. No scleral icterus.   Neck: Normal range of motion. Neck supple. No JVD present.   Cardiovascular: Normal rate, regular rhythm, normal heart sounds and intact distal pulses.    No murmur heard.  Pulses:       Radial pulses are 2+ on the right side, and 2+ on the left side.   2+ BL radial pulses    Pulmonary/Chest: Effort normal and breath sounds normal. No stridor. No respiratory distress. She has no wheezes. She has no rales.   Abdominal: Soft. Bowel sounds are normal. She exhibits no distension.  There is no tenderness. There is no rebound and no guarding.   Musculoskeletal: She exhibits no edema or tenderness.   Neurological: She is alert and oriented to person, place, and time.   Skin: Skin is warm and dry. No rash noted. She is not diaphoretic. There is erythema.   Erythema and swelling over entire L forearm and L hand, warm to touch, tenderness to light palpation   Psychiatric: She has a normal mood and affect.   Nursing note and vitals reviewed.  Written by Payton Mccallum, ED Scribe as dictated by Reynaldo Minium, MD    MDM  Number of Diagnoses or Management Options  Cellulitis:   Diagnosis management comments: Cellulitis with probably thrombophlebitis. Bedside ultrasound showed noncompressible vascular structure in region of erythema/swelling and no abscess/fluid collection. Admit for further mgmt.       Amount and/or Complexity of Data Reviewed  Clinical lab tests: ordered and reviewed  Tests in the radiology section of CPT??: ordered and reviewed  Tests in the medicine section of CPT??: ordered and reviewed  Review and summarize past medical records: yes  Discuss the patient with other providers: yes (Hospitalist  )  Independent visualization of images, tracings, or specimens: yes    Patient Progress  Patient progress: stable      Procedures  EKG interpretation: (Preliminary)  2105  Rhythm: normal sinus rhythm; and regular . Rate (approx.): 88; Axis: normal; P wave: normal; QRS interval: normal ; ST/T wave: normal  Written by Payton Mccallum, ED Scribe as dictated by Reynaldo Minium, MD    CONSULT NOTE:   10:06 PM  Reynaldo Minium, MD spoke with Dr.Schmitz,   Specialty: Hospitalist  Discussed pt's hx, disposition, and available diagnostic and imaging results. Reviewed care plans. Consultant will evaluate pt for admission.  Written by Payton Mccallum, ED Scribe, as dictated by Reynaldo Minium, MD.    LABORATORY TESTS:  Recent Results (from the past 12 hour(s))   LACTIC ACID, PLASMA     Collection Time: 06/03/14  7:34 PM   Result Value Ref Range    Lactic acid 1.1 0.4 - 2.0 MMOL/L   METABOLIC PANEL, COMPREHENSIVE    Collection Time: 06/03/14  7:34 PM   Result Value Ref Range    Sodium 141 136 - 145 mmol/L    Potassium 3.9 3.5 -  5.1 mmol/L    Chloride 112 (H) 97 - 108 mmol/L    CO2 21 21 - 32 mmol/L    Anion gap 8 5 - 15 mmol/L    Glucose 131 (H) 65 - 100 mg/dL    BUN 9 6 - 20 MG/DL    Creatinine 1.01 0.55 - 1.02 MG/DL    BUN/Creatinine ratio 9 (L) 12 - 20      GFR est AA >60 >60 ml/min/1.19m    GFR est non-AA 55 (L) >60 ml/min/1.726m   Calcium 8.6 8.5 - 10.1 MG/DL    Bilirubin, total 0.3 0.2 - 1.0 MG/DL    ALT 32 12 - 78 U/L    AST 24 15 - 37 U/L    Alk. phosphatase 52 45 - 117 U/L    Protein, total 7.9 6.4 - 8.2 g/dL    Albumin 3.8 3.5 - 5.0 g/dL    Globulin 4.1 (H) 2.0 - 4.0 g/dL    A-G Ratio 0.9 (L) 1.1 - 2.2     CBC WITH AUTOMATED DIFF    Collection Time: 06/03/14  7:34 PM   Result Value Ref Range    WBC 10.1 3.6 - 11.0 K/uL    RBC 4.03 3.80 - 5.20 M/uL    HGB 12.2 11.5 - 16.0 g/dL    HCT 37.5 35.0 - 47.0 %    MCV 93.1 80.0 - 99.0 FL    MCH 30.3 26.0 - 34.0 PG    MCHC 32.5 30.0 - 36.5 g/dL    RDW 13.8 11.5 - 14.5 %    PLATELET 294 150 - 400 K/uL    NEUTROPHILS 63 32 - 75 %    LYMPHOCYTES 25 12 - 49 %    MONOCYTES 10 5 - 13 %    EOSINOPHILS 2 0 - 7 %    BASOPHILS 0 0 - 1 %    ABS. NEUTROPHILS 6.4 1.8 - 8.0 K/UL    ABS. LYMPHOCYTES 2.5 0.8 - 3.5 K/UL    ABS. MONOCYTES 1.0 0.0 - 1.0 K/UL    ABS. EOSINOPHILS 0.2 0.0 - 0.4 K/UL    ABS. BASOPHILS 0.0 0.0 - 0.1 K/UL   GLUCOSE, POC    Collection Time: 06/03/14  7:58 PM   Result Value Ref Range    Glucose (POC) 140 (H) 65 - 100 mg/dL    Performed by JEAmadeo Garnet  URINALYSIS W/ RFLX MICROSCOPIC    Collection Time: 06/03/14  8:00 PM   Result Value Ref Range    Color YELLOW/STRAW      Appearance CLOUDY (A) CLEAR      Specific gravity 1.025 1.003 - 1.030      pH (UA) 7.0 5.0 - 8.0      Protein TRACE (A) NEG mg/dL    Glucose NEGATIVE  NEG mg/dL     Ketone TRACE (A) NEG mg/dL    Bilirubin NEGATIVE  NEG      Blood NEGATIVE  NEG      Urobilinogen 1.0 0.2 - 1.0 EU/dL    Nitrites NEGATIVE  NEG      Leukocyte Esterase NEGATIVE  NEG      WBC 0-4 0 - 4 /hpf    RBC 0-5 0 - 5 /hpf    Epithelial cells FEW FEW /lpf    Bacteria 2+ (A) NEG /hpf    Mucus 1+ (A) NEG /lpf    CA Oxalate Crystals 1+ (A) NEG    Hyaline  Cast 5-10 0 - 2 /lpf   EKG, 12 LEAD, INITIAL    Collection Time: 06/03/14  9:01 PM   Result Value Ref Range    Ventricular Rate 88 BPM    Atrial Rate 88 BPM    P-R Interval 150 ms    QRS Duration 70 ms    Q-T Interval 352 ms    QTC Calculation (Bezet) 425 ms    Calculated P Axis 40 degrees    Calculated R Axis 16 degrees    Calculated T Axis 26 degrees    Diagnosis       Normal sinus rhythm  When compared with ECG of 30-May-2014 17:10,  Questionable change in QRS axis         IMAGING RESULTS:  XR CHEST PA LAT   Final Result   **Final Report**          ICD Codes / Adm.Diagnosis: 387564  R56.9 / Arm swelling  Hand Swelling   Examination:  CR CHEST PA AND LATERAL  - [id number] - Jun 03 2014  7:40PM   Accession No:  [id number]   Reason:  Sepsis         REPORT:   INDICATION: Arm swelling Hand Swelling. Sepsis   COMPARISON: Previous chest xray, 05/30/2014.   Marland Kitchen   FINDINGS: PA and lateral view of the chest.    .   Lines/tubes/surgical: None.    Heart/mediastinum: Unremarkable.    Lungs/pleura:  No focal consolidation or mass. No visualized pleural    effusion or pneumothorax.    Additional Comments: None.    Marland Kitchen        IMPRESSION:   1. No radiographic evidence of acute cardiopulmonary disease.                   Signing/Reading Doctor: Levell July [phone])     Approved: Levell July [phone])  Jun 03 2014  7:41PM                                            MEDICATIONS GIVEN:  Medications   sodium chloride (NS) flush 5-10 mL (not administered)   vancomycin (VANCOCIN) 2,000 mg in 0.9% sodium chloride 500 mL IVPB (2,000 mg IntraVENous New Bag 06/03/14 2207)    sodium chloride (NS) flush 5-10 mL (not administered)   sodium chloride (NS) flush 5-10 mL (not administered)   acetaminophen (TYLENOL) tablet 650 mg (not administered)   oxyCODONE-acetaminophen (PERCOCET) 5-325 mg per tablet 1 Tab (not administered)   morphine injection 2 mg (not administered)   acetaminophen (TYLENOL) tablet 650 mg (not administered)   ondansetron (ZOFRAN) injection 4 mg (not administered)   nicotine (NICODERM CQ) 7 mg/24 hr patch 1 Patch (not administered)   enoxaparin (LOVENOX) injection 40 mg (not administered)   ampicillin-sulbactam (UNASYN) 3 g in 0.9% sodium chloride (MBP/ADV) 100 mL (not administered)   insulin lispro (HUMALOG) injection (not administered)   glucose chewable tablet 16 g (not administered)   dextrose (D50W) injection syrg 12.5-25 g (not administered)   glucagon (GLUCAGEN) injection 1 mg (not administered)   lacosamide (VIMPAT) tablet 100 mg (100 mg Oral Given 06/03/14 2055)   topiramate (TOPAMAX) tablet 100 mg (100 mg Oral Given 06/03/14 2055)   ibuprofen (MOTRIN) tablet 600 mg (600 mg Oral Given 06/03/14 2211)     IMPRESSION:  1. Cellulitis      PLAN:  1. Admit    ADMIT NOTE:   10:07 PM    The patient is being admitted to the hospital by Dr.Schmitz. The result of their tests and reasons for admission have been discussed with the patient and/or family. They convey agreement and understanding for the need to be admitted and for their admission diagnosis.     Written by Payton Mccallum, ED Scribe as dictated by Reynaldo Minium, MD

## 2014-06-03 NOTE — ED Notes (Signed)
Phlebotomy is at bedside. Dr. Gelene MinkFrederick is updating pt at this time. Pt wants to leave but pt's daughter states she needs to relax and stay.

## 2014-06-03 NOTE — ED Notes (Signed)
Back from the US. Pt is switched to room 20 and instructed to change in to gown

## 2014-06-03 NOTE — ED Notes (Signed)
Pt is sitting in bed at this time. Discussed blood draw with phlebotomy at this time. She will come draw on him.

## 2014-06-03 NOTE — ED Notes (Signed)
Shift change report is received from Rebecca (offgoing nurse) to Tefera (oncoming nurse)  Report included the following information SBAR, ED Summary and MAR.

## 2014-06-03 NOTE — ED Notes (Signed)
Called phlebotomy again to come draw blood cultures.

## 2014-06-03 NOTE — Progress Notes (Signed)
Pharmacy ??? Vancomycin Dosing    Pharmacy consulted to dose vancomycin for this 68 y.o. female being treated for skin and soft tissue infection    Other Current Antimicrobials: unasyn 3 gm daq8h  Significant Cultures: blood culture pending      Paralysis, amputations, malnutrition   n/a   Height and Weight  Ht Readings from Last 1 Encounters:   06/03/14 162.6 cm (64")      Wt Readings from Last 1 Encounters:   06/03/14 81.2 kg (179 lb 0.2 oz)         Renal Function Creatinine Clearance (ml/min):~56 ml/min    Recent Labs      06/03/14   1934   CREA  1.01   BUN  9      WBC Recent Labs      06/03/14   1934   WBC  10.1      Temp 98.8 ??F (37.1 ??C) ()     Loading dose: 2000 mg x1  Maintenance dose: 1000 mg q18h  Goal Level: vanc trough 10-15  Pharmacy will follow daily and adjust as appropriate.    Thanks for the consult,  Mechele CollinHEODORE [name], RPh

## 2014-06-03 NOTE — ED Notes (Signed)
Dr. Gelene MinkFrederick is at bedside at this time because pt is disoriented. Per Rockwell Germanyourtney E. This is a change from when she arrived. Pt's daughter states she is like this when they change her medications which they recently did. Pt is oriented X3 and disoriented Xsituation and time. Pt is sitting in bed at this time. Pt stated her daughter was her close friend.

## 2014-06-03 NOTE — H&P (Signed)
Hospitalist Admission Note    NAME: Meghan [name]   DOB:  April 21, 1946   MRN:  [id number]     Date/Time:  06/03/2014 10:49 PM    Patient PCP: Evangeline Gula, MD  ________________________________________________________________________    My assessment of this patient's clinical condition and my plan of care is as follows.    Assessment / Plan:    Left arm cellulitis   Admit to medical floor, cont Vancomycin for now, add Unasyn, check ultrasound of the left arm to r/o abscess formation, send blood cultures      Seizure disorder  Cont home meds to include Dilantin, Vimpat and Topamax, pt's family thinks they have noted some behavioral changed with the Dilantin and request to speak with them, will consult per request      Hypertension  Stable, cont home meds      Borderline diabetes mellitus  Add SSI and follow FSBS      Depression  Cont home Celexa      Code Status: Full  Surrogate Decision Maker:    DVT Prophylaxis: Lovenox  GI Prophylaxis: not indicated    Baseline: Functional        Subjective:   CHIEF COMPLAINT: Left arm pain and swelling    HISTORY OF PRESENT ILLNESS:     Meghan [name] is a 68 y.o.  female who presents with    Complaints of left forearm swelling and redness, she was recently admitted here for eval of seizures  That admission was 10/17 - 10/19 and meds were adjusted, she thinks the left arm was swelling at DC  She says the swelling continued to worsen and she developed some redness and erythema,  There is no drainage, the area is painful, involves the area where he IV was and extends from there  She denies any fevers or chills, came back for eval and thought to have thrombophlebitis with cellulitis  Started on Vanc and RFA placed to Hospitalist medicine for further mgmt    We were asked to admit for work up and evaluation of the above problems.     Past Medical History   Diagnosis Date   ??? Seizures ([place])    ??? Hypertension    ??? Psychiatric disorder      depression     Borderline DM2    Past Surgical History   Procedure Laterality Date   ??? Hx gyn       hysterectomy       History   Substance Use Topics   ??? Smoking status: Current Every Day Smoker   ??? Smokeless tobacco: Not on file   ??? Alcohol Use: No        History reviewed. No pertinent family history. FHx significant for hypertension  Allergies   Allergen Reactions   ??? Keppra [Levetiracetam] Other (comments)     "disoriented"        Prior to Admission medications    Medication Sig Start Date End Date Taking? Authorizing Provider   phenytoin ER (DILANTIN ER) 300 mg ER capsule Take 1 Cap by mouth daily. Start 10/20 06/02/14   Mammie Russian, MD   lacosamide (VIMPAT) 100 mg tab tablet Take 100 mg by mouth every twelve (12) hours.    Phys Other, MD   fluticasone (FLONASE) 50 mcg/actuation nasal spray 2 Sprays by Both Nostrils route nightly.    Phys Other, MD   aspirin 81 mg chewable tablet Take 81 mg by mouth daily.    Phys Other, MD  citalopram (CELEXA) 20 mg tablet Take 20 mg by mouth daily.    Phys Other, MD   topiramate (TOPAMAX) 100 mg tablet Take 200 mg by mouth every twelve (12) hours.    Phys Other, MD   oxyCODONE-acetaminophen (PERCOCET) 5-325 mg per tablet Take 1 Tab by mouth every six (6) hours as needed for Pain. 11/02/12   Saddie Benders, PA   diazepam (VALIUM) 5 mg tablet Take 1 Tab by mouth every twelve (12) hours as needed (spasm). 11/02/12   Saddie Benders, PA       REVIEW OF SYSTEMS:     I am not able to complete the review of systems because:   The patient is intubated and sedated    The patient has altered mental status due to his acute medical problems    The patient has baseline aphasia from prior stroke(s)    The patient has baseline dementia and is not reliable historian    The patient is in acute medical distress and unable to provide information           Total of 12 systems reviewed as follows:       POSITIVE= underlined text  Negative = text not underlined   General:  fever, chills, sweats, generalized weakness, weight loss/gain,      loss of appetite   Eyes:    blurred vision, eye pain, loss of vision, double vision  ENT:    rhinorrhea, pharyngitis   Respiratory:   cough, sputum production, SOB, DOE, wheezing, pleuritic pain   Cardiology:   chest pain, palpitations, orthopnea, PND, edema, syncope   Gastrointestinal:  abdominal pain , N/V, diarrhea, dysphagia, constipation, bleeding   Genitourinary:  frequency, urgency, dysuria, hematuria, incontinence   Muskuloskeletal :  arthralgia, myalgia, back pain  Hematology:  easy bruising, nose or gum bleeding, lymphadenopathy   Dermatological: rash, ulceration, pruritis, color change / jaundice  Endocrine:   hot flashes or polydipsia   Neurological:  headache, dizziness, confusion, focal weakness, paresthesia,     Speech difficulties, memory loss, gait difficulty  Psychological: Feelings of anxiety, depression, agitation    Objective:   VITALS:    Visit Vitals   Item Reading   ??? BP 108/58 mmHg   ??? Pulse 86   ??? Temp 98.8 ??F (37.1 ??C)   ??? Resp 18   ??? Ht '5\' 4"'  (1.626 m)   ??? Wt 81.2 kg (179 lb 0.2 oz)   ??? BMI 30.71 kg/m2   ??? SpO2 98%       PHYSICAL EXAM:    General:    Alert, cooperative, no distress, appears stated age.     HEENT: Atraumatic, anicteric sclerae, pink conjunctivae     No oral ulcers, mucosa moist, throat clear, dentition fair  Neck:  Supple, symmetrical,  thyroid: non tender  Lungs:   Clear to auscultation bilaterally.  No Wheezing or Rhonchi. No rales.  Chest wall:  No tenderness  No Accessory muscle use.  Heart:   Regular  rhythm,  No  murmur   No edema  Abdomen:   Soft, non-tender. Not distended.  Bowel sounds normal  Extremities: No cyanosis.  No clubbing, left forearm with area of erythema and induration c/w cellulitis,                           no palpable abscess, area outlined, covers most of forearm  Skin:     Not pale.  Not  Jaundiced  No rashes   Psych:  Not depressed.  Not anxious or agitated.   Neurologic: EOMs intact. No facial asymmetry. No aphasia or slurred speech. Symmetrical strength, Sensation grossly intact. Alert and oriented X 4.     _______________________________________________________________________  Care Plan discussed with:    Comments   Patient X    Family  X    RN     Care Manager                    Consultant:  X ED Provider   _______________________________________________________________________  Expected  Disposition:   Home with Family X   HH/PT/OT/RN    SNF/LTC    [facility]    ________________________________________________________________________  TOTAL TIME:  55 Minutes    Critical Care Provided     Minutes non procedure based      Comments   >50% of visit spent in counseling and coordination of care  Chart review  Discussion with patient and/or family and questions answered     ________________________________________________________________________  Maxwell Caul, MD    Procedures: see electronic medical records for all procedures/Xrays and details which were not copied into this note but were reviewed prior to creation of Plan.    LAB DATA REVIEWED:    Recent Results (from the past 24 hour(s))   LACTIC ACID, PLASMA    Collection Time: 06/03/14  7:34 PM   Result Value Ref Range    Lactic acid 1.1 0.4 - 2.0 MMOL/L   METABOLIC PANEL, COMPREHENSIVE    Collection Time: 06/03/14  7:34 PM   Result Value Ref Range    Sodium 141 136 - 145 mmol/L    Potassium 3.9 3.5 - 5.1 mmol/L    Chloride 112 (H) 97 - 108 mmol/L    CO2 21 21 - 32 mmol/L    Anion gap 8 5 - 15 mmol/L    Glucose 131 (H) 65 - 100 mg/dL    BUN 9 6 - 20 MG/DL    Creatinine 1.01 0.55 - 1.02 MG/DL    BUN/Creatinine ratio 9 (L) 12 - 20      GFR est AA >60 >60 ml/min/1.77m    GFR est non-AA 55 (L) >60 ml/min/1.770m   Calcium 8.6 8.5 - 10.1 MG/DL    Bilirubin, total 0.3 0.2 - 1.0 MG/DL    ALT 32 12 - 78 U/L    AST 24 15 - 37 U/L    Alk. phosphatase 52 45 - 117 U/L    Protein, total 7.9 6.4 - 8.2 g/dL     Albumin 3.8 3.5 - 5.0 g/dL    Globulin 4.1 (H) 2.0 - 4.0 g/dL    A-G Ratio 0.9 (L) 1.1 - 2.2     CBC WITH AUTOMATED DIFF    Collection Time: 06/03/14  7:34 PM   Result Value Ref Range    WBC 10.1 3.6 - 11.0 K/uL    RBC 4.03 3.80 - 5.20 M/uL    HGB 12.2 11.5 - 16.0 g/dL    HCT 37.5 35.0 - 47.0 %    MCV 93.1 80.0 - 99.0 FL    MCH 30.3 26.0 - 34.0 PG    MCHC 32.5 30.0 - 36.5 g/dL    RDW 13.8 11.5 - 14.5 %    PLATELET 294 150 - 400 K/uL    NEUTROPHILS 63 32 - 75 %    LYMPHOCYTES 25 12 - 49 %    MONOCYTES 10 5 -  13 %    EOSINOPHILS 2 0 - 7 %    BASOPHILS 0 0 - 1 %    ABS. NEUTROPHILS 6.4 1.8 - 8.0 K/UL    ABS. LYMPHOCYTES 2.5 0.8 - 3.5 K/UL    ABS. MONOCYTES 1.0 0.0 - 1.0 K/UL    ABS. EOSINOPHILS 0.2 0.0 - 0.4 K/UL    ABS. BASOPHILS 0.0 0.0 - 0.1 K/UL   GLUCOSE, POC    Collection Time: 06/03/14  7:58 PM   Result Value Ref Range    Glucose (POC) 140 (H) 65 - 100 mg/dL    Performed by Amadeo Garnet    URINALYSIS W/ RFLX MICROSCOPIC    Collection Time: 06/03/14  8:00 PM   Result Value Ref Range    Color YELLOW/STRAW      Appearance CLOUDY (A) CLEAR      Specific gravity 1.025 1.003 - 1.030      pH (UA) 7.0 5.0 - 8.0      Protein TRACE (A) NEG mg/dL    Glucose NEGATIVE  NEG mg/dL    Ketone TRACE (A) NEG mg/dL    Bilirubin NEGATIVE  NEG      Blood NEGATIVE  NEG      Urobilinogen 1.0 0.2 - 1.0 EU/dL    Nitrites NEGATIVE  NEG      Leukocyte Esterase NEGATIVE  NEG      WBC 0-4 0 - 4 /hpf    RBC 0-5 0 - 5 /hpf    Epithelial cells FEW FEW /lpf    Bacteria 2+ (A) NEG /hpf    Mucus 1+ (A) NEG /lpf    CA Oxalate Crystals 1+ (A) NEG    Hyaline Cast 5-10 0 - 2 /lpf   EKG, 12 LEAD, INITIAL    Collection Time: 06/03/14  9:01 PM   Result Value Ref Range    Ventricular Rate 88 BPM    Atrial Rate 88 BPM    P-R Interval 150 ms    QRS Duration 70 ms    Q-T Interval 352 ms    QTC Calculation (Bezet) 425 ms    Calculated P Axis 40 degrees    Calculated R Axis 16 degrees    Calculated T Axis 26 degrees    Diagnosis        Normal sinus rhythm  When compared with ECG of 30-May-2014 17:10,  Questionable change in QRS axis

## 2014-06-03 NOTE — ED Notes (Signed)
Bedside and Verbal shift change report given to Lurena Joinerebecca, Charity fundraiserN (oncoming nurse) by Toni Amendourtney, RN (offgoing nurse). Report included the following information SBAR, Kardex, ED Summary, Intake/Output, MAR and Recent Results.

## 2014-06-03 NOTE — ED Notes (Signed)
Hospitalist is at bedside.

## 2014-06-04 LAB — URINALYSIS W/ RFLX MICROSCOPIC
Bilirubin: NEGATIVE
Blood: NEGATIVE
Glucose: NEGATIVE mg/dL
Leukocyte Esterase: NEGATIVE
Nitrites: NEGATIVE
Specific gravity: 1.025 (ref 1.003–1.030)
Urobilinogen: 1 EU/dL (ref 0.2–1.0)
pH (UA): 7 (ref 5.0–8.0)

## 2014-06-04 LAB — EKG, 12 LEAD, INITIAL
Atrial Rate: 88 {beats}/min
Calculated P Axis: 40 degrees
Calculated R Axis: 16 degrees
Calculated T Axis: 26 degrees
Diagnosis: NORMAL
P-R Interval: 150 ms
Q-T Interval: 352 ms
QRS Duration: 70 ms
QTC Calculation (Bezet): 425 ms
Ventricular Rate: 88 {beats}/min

## 2014-06-04 LAB — GLUCOSE, POC
Glucose (POC): 140 mg/dL — ABNORMAL HIGH (ref 65–100)
Glucose (POC): 167 mg/dL — ABNORMAL HIGH (ref 65–100)
Glucose (POC): 87 mg/dL (ref 65–100)

## 2014-06-04 LAB — METABOLIC PANEL, COMPREHENSIVE
A-G Ratio: 0.9 — ABNORMAL LOW (ref 1.1–2.2)
ALT (SGPT): 32 U/L (ref 12–78)
AST (SGOT): 24 U/L (ref 15–37)
Albumin: 3.8 g/dL (ref 3.5–5.0)
Alk. phosphatase: 52 U/L (ref 45–117)
Anion gap: 8 mmol/L (ref 5–15)
BUN/Creatinine ratio: 9 — ABNORMAL LOW (ref 12–20)
BUN: 9 MG/DL (ref 6–20)
Bilirubin, total: 0.3 MG/DL (ref 0.2–1.0)
CO2: 21 mmol/L (ref 21–32)
Calcium: 8.6 MG/DL (ref 8.5–10.1)
Chloride: 112 mmol/L — ABNORMAL HIGH (ref 97–108)
Creatinine: 1.01 MG/DL (ref 0.55–1.02)
GFR est AA: >60 mL/min/{1.73_m2} (ref >60)
GFR est non-AA: 55 mL/min/{1.73_m2} — ABNORMAL LOW (ref >60)
Globulin: 4.1 g/dL — ABNORMAL HIGH (ref 2.0–4.0)
Glucose: 131 mg/dL — ABNORMAL HIGH (ref 65–100)
Potassium: 3.9 mmol/L (ref 3.5–5.1)
Protein, total: 7.9 g/dL (ref 6.4–8.2)
Sodium: 141 mmol/L (ref 136–145)

## 2014-06-04 LAB — LACTIC ACID: Lactic acid: 1.1 MMOL/L (ref 0.4–2.0)

## 2014-06-04 MED ORDER — ONDANSETRON (PF) 4 MG/2 ML INJECTION
4 mg/2 mL | INTRAMUSCULAR | Status: DC | PRN
Start: 2014-06-04 — End: 2014-06-05

## 2014-06-04 MED ORDER — VANCOMYCIN 10 GRAM IV SOLR
10 gram | INTRAVENOUS | Status: DC
Start: 2014-06-04 — End: 2014-06-05
  Administered 2014-06-04: 21:00:00 via INTRAVENOUS

## 2014-06-04 MED ORDER — ACETAMINOPHEN 325 MG TABLET
325 mg | ORAL | Status: DC | PRN
Start: 2014-06-04 — End: 2014-06-05

## 2014-06-04 MED ORDER — IBUPROFEN 600 MG TAB
600 mg | ORAL | Status: AC
Start: 2014-06-04 — End: 2014-06-03
  Administered 2014-06-04: 02:00:00 via ORAL

## 2014-06-04 MED ORDER — SODIUM CHLORIDE 0.9 % IJ SYRG
INTRAMUSCULAR | Status: DC | PRN
Start: 2014-06-04 — End: 2014-06-05

## 2014-06-04 MED ORDER — MORPHINE 2 MG/ML INJECTION
2 mg/mL | INTRAMUSCULAR | Status: DC | PRN
Start: 2014-06-04 — End: 2014-06-05

## 2014-06-04 MED ORDER — GLUCOSE 4 GRAM CHEWABLE TAB
4 gram | ORAL | Status: DC | PRN
Start: 2014-06-04 — End: 2014-06-05

## 2014-06-04 MED ORDER — INSULIN LISPRO 100 UNIT/ML INJECTION
100 unit/mL | Freq: Four times a day (QID) | SUBCUTANEOUS | Status: DC
Start: 2014-06-04 — End: 2014-06-05

## 2014-06-04 MED ORDER — AMPICILLIN-SULBACTAM 3 GRAM SOLUTION FOR INJECTION
3 gram | Freq: Three times a day (TID) | INTRAMUSCULAR | Status: DC
Start: 2014-06-04 — End: 2014-06-05
  Administered 2014-06-04 – 2014-06-05 (×4): via INTRAVENOUS

## 2014-06-04 MED ORDER — TOPIRAMATE 100 MG TAB
100 mg | Freq: Two times a day (BID) | ORAL | Status: DC
Start: 2014-06-04 — End: 2014-06-05
  Administered 2014-06-04 – 2014-06-05 (×3): via ORAL

## 2014-06-04 MED ORDER — OXYCODONE-ACETAMINOPHEN 5 MG-325 MG TAB
5-325 mg | ORAL | Status: DC | PRN
Start: 2014-06-04 — End: 2014-06-05
  Administered 2014-06-05 (×2): via ORAL

## 2014-06-04 MED ORDER — ENOXAPARIN 40 MG/0.4 ML SUB-Q SYRINGE
40 mg/0.4 mL | SUBCUTANEOUS | Status: DC
Start: 2014-06-04 — End: 2014-06-05
  Administered 2014-06-04 – 2014-06-05 (×2): via SUBCUTANEOUS

## 2014-06-04 MED ORDER — ASPIRIN 81 MG CHEWABLE TAB
81 mg | Freq: Every day | ORAL | Status: DC
Start: 2014-06-04 — End: 2014-06-05
  Administered 2014-06-04 – 2014-06-05 (×2): via ORAL

## 2014-06-04 MED ORDER — SODIUM CHLORIDE 0.9 % IJ SYRG
Freq: Three times a day (TID) | INTRAMUSCULAR | Status: DC
Start: 2014-06-04 — End: 2014-06-05
  Administered 2014-06-04 – 2014-06-05 (×4): via INTRAVENOUS

## 2014-06-04 MED ORDER — NICOTINE 7 MG/24 HR DAILY PATCH
7 mg/24 hr | TRANSDERMAL | Status: DC
Start: 2014-06-04 — End: 2014-06-05

## 2014-06-04 MED ORDER — GLUCAGON 1 MG INJECTION
1 mg | INTRAMUSCULAR | Status: DC | PRN
Start: 2014-06-04 — End: 2014-06-05

## 2014-06-04 MED ORDER — PHENYTOIN SODIUM EXTENDED 100 MG CAP
100 mg | Freq: Every day | ORAL | Status: DC
Start: 2014-06-04 — End: 2014-06-05
  Administered 2014-06-04 – 2014-06-05 (×2): via ORAL

## 2014-06-04 MED ORDER — CITALOPRAM 20 MG TAB
20 mg | Freq: Every day | ORAL | Status: DC
Start: 2014-06-04 — End: 2014-06-05
  Administered 2014-06-04 – 2014-06-05 (×2): via ORAL

## 2014-06-04 MED ORDER — FLUTICASONE 50 MCG/ACTUATION NASAL SPRAY, SUSP
50 mcg/actuation | Freq: Every evening | NASAL | Status: DC
Start: 2014-06-04 — End: 2014-06-05

## 2014-06-04 MED ORDER — LACOSAMIDE 100 MG TAB
100 mg | Freq: Two times a day (BID) | ORAL | Status: DC
Start: 2014-06-04 — End: 2014-06-05
  Administered 2014-06-04 – 2014-06-05 (×2): via ORAL

## 2014-06-04 MED ORDER — DEXTROSE 50% IN WATER (D50W) IV SYRG
INTRAVENOUS | Status: DC | PRN
Start: 2014-06-04 — End: 2014-06-05

## 2014-06-04 MED ORDER — SODIUM CHLORIDE 0.9 % IV
10 gram | INTRAVENOUS | Status: AC
Start: 2014-06-04 — End: 2014-06-04
  Administered 2014-06-04: 02:00:00 via INTRAVENOUS

## 2014-06-04 MED FILL — VANCOMYCIN 10 GRAM IV SOLR: 10 gram | INTRAVENOUS | Qty: 1000

## 2014-06-04 MED FILL — LOVENOX 40 MG/0.4 ML SUBCUTANEOUS SYRINGE: 40 mg/0.4 mL | SUBCUTANEOUS | Qty: 0.4

## 2014-06-04 MED FILL — PHENYTOIN SODIUM EXTENDED 100 MG CAP: 100 mg | ORAL | Qty: 3

## 2014-06-04 MED FILL — AMPICILLIN-SULBACTAM 3 GRAM SOLUTION FOR INJECTION: 3 gram | INTRAMUSCULAR | Qty: 1

## 2014-06-04 MED FILL — CITALOPRAM 20 MG TAB: 20 mg | ORAL | Qty: 1

## 2014-06-04 MED FILL — VIMPAT 100 MG TABLET: 100 mg | ORAL | Qty: 1

## 2014-06-04 MED FILL — IBUPROFEN 600 MG TAB: 600 mg | ORAL | Qty: 1

## 2014-06-04 MED FILL — VANCOMYCIN 10 GRAM IV SOLR: 10 gram | INTRAVENOUS | Qty: 2000

## 2014-06-04 MED FILL — CHILDREN'S ASPIRIN 81 MG CHEWABLE TABLET: 81 mg | ORAL | Qty: 1

## 2014-06-04 MED FILL — TOPIRAMATE 100 MG TAB: 100 mg | ORAL | Qty: 2

## 2014-06-04 MED FILL — NICOTINE 7 MG/24 HR DAILY PATCH: 7 mg/24 hr | TRANSDERMAL | Qty: 1

## 2014-06-04 NOTE — ED Notes (Signed)
Pt stated that she had her hearing aid and she is searching for it and didn't get it. There is nothing in the room

## 2014-06-04 NOTE — Consults (Signed)
Vascular Surgery  Consult Note    Impression:  Afebrile, normal WBC  Palpable cord in left forearm  Duplex shows occlusion of the cephalic vein in the forearm  I do not think she has cellulitis, suppurative thrombophlebitis, or an abscess    Plan:  OK for discharge  Warm compresses, NSAIDs and elevation  This will take several weeks to resolve but will get better with conservative care      Severiano GilbertWILLIAM A Micky Sheller, MD

## 2014-06-04 NOTE — Progress Notes (Signed)
0830- TRANSFER - IN REPORT:    Verbal report received from Jenise RN(name) on Meghan [name]  being received from ED(unit) for routine progression of care      Report consisted of patient???s Situation, Background, Assessment and   Recommendations(SBAR).     Information from the following report(s) SBAR, Kardex, ED Summary, Procedure Summary, Intake/Output, MAR and Recent Results was reviewed with the receiving nurse.    Opportunity for questions and clarification was provided.      Assessment completed upon patient???s arrival to unit and care assumed.

## 2014-06-04 NOTE — ED Notes (Signed)
Charge nurse notified that bed is not available in the floor and pt is going to be a hold. Pt is updated

## 2014-06-04 NOTE — Other (Signed)
TRANSFER - IN REPORT:    [facility]leinBarbara [name] for routine progression of care.      Report consisted of patient???s Situation, Background, Assessment and   Recommendations(SBAR).     Information from the following report(s) SBAR, Kardex, ED Summary and Recent Results was reviewed.    Opportunity for questions and clarification was provided.

## 2014-06-04 NOTE — ED Notes (Signed)
Assumed care of patient, [facility], patient waiting for in-patient bed assignment. Call bell in reach.

## 2014-06-04 NOTE — Progress Notes (Signed)
Patient IV infiltrated, removed and new IV restarted. Patient tolerated well.

## 2014-06-04 NOTE — Other (Signed)
Notified Dr Jordan LikesSchmitz of patient refusing to have blood sugar checks done. Dr Jordan LikesSchmitz will look at the chart and requested  to keep documenting refusal.

## 2014-06-04 NOTE — ED Notes (Signed)
Verbal shift change report given to Kristin (oncoming nurse) by Tefera (offgoing nurse). Report included the following information SBAR, ED Summary and MAR.

## 2014-06-04 NOTE — ED Notes (Signed)
Pt was asked if she needs pain medicine and refused

## 2014-06-04 NOTE — Progress Notes (Signed)
1100- Bedside shift change report given to Dois DavenportSandra RN (oncoming nurse) by Vonita MossKatherine RN (offgoing nurse). Report included the following information SBAR, Kardex, ED Summary, Procedure Summary, Intake/Output, MAR, Recent Results, Med Rec Status and Cardiac Rhythm NSR.

## 2014-06-04 NOTE — ED Notes (Signed)
Pt reported that her pain is about 8/10 but refused pain medication

## 2014-06-04 NOTE — Consults (Signed)
Patient denies any behavioral issues or any other side effects of dilantin. States that she has been on dilantin for years in past and had no side effects at that time either.   Patient blames that her step daughter is interfering with her things too much and this is confusing patient.   Nothing different neurologically.

## 2014-06-04 NOTE — Progress Notes (Signed)
Hospitalist Progress Note    NAME: Christiana FuchsBarbara J Mazariego   DOB:  Jul 25, 1946   MRN:  [id number]       Interim Hospital Summary: 68 y.o. female whom presented on 06/03/2014 with left arm pain      Assessment / Plan:  Left arm cellulitis   Admit to medical floor, cont Vancomycin for now, add Unasyn,   Left radial vein occlusion   - I spoke to Dr. Nathaneil Canaryierney about patient condition. He will evaluate patient today  - will check venous doppler ultrasound on the left arm  Seizure disorder  Seizure precaution  Cont home meds to include Dilantin, Vimpat and Topamax,  Appreciate neurology input  Hypertension  Stable, cont home meds  Borderline diabetes mellitus  Add SSI and follow FSBS  Depression  Cont home Celexa  Code Status: Full  Surrogate Decision Maker:    DVT Prophylaxis: Lovenox  GI Prophylaxis: not indicated    Baseline: Functional      Subjective:     Chief Complaint / Reason for Physician Visit  "f/w left arm cellulitis ". Pain control but still swelling no seizure no fever.  Discussed with RN events overnight.     Review of Systems:  Symptom Y/N Comments  Symptom Y/N Comments   Fever/Chills n   Chest Pain n    Poor Appetite n   Edema y  left arm   Cough n   Abdominal Pain     Sputum n   Joint Pain     SOB/DOE    Pruritis/Rash     Nausea/vomit    Tolerating PT/OT     Diarrhea    Tolerating Diet     Constipation    Other       Could NOT obtain due to:      Objective:     VITALS:   Last 24hrs VS reviewed since prior progress note. Most recent are:  Patient Vitals for the past 24 hrs:   Temp Pulse Resp BP SpO2   06/04/14 0830 97.9 ??F (36.6 ??C) 89 18 138/85 mmHg 99 %   06/04/14 0759 98.2 ??F (36.8 ??C) 76 16 124/67 mmHg 96 %   06/04/14 0200 - 78 8 94/44 mmHg 95 %   06/04/14 0100 - 85 20 93/70 mmHg 100 %   06/04/14 0003 - 88 18 (!) 154/97 mmHg 99 %   06/03/14 2355 - 82 14 - 98 %   06/03/14 2354 - - - 128/73 mmHg -   06/03/14 2232 - 86 18 108/58 mmHg 98 %    06/03/14 2106 - 86 18 135/71 mmHg 98 %   06/03/14 1754 98.8 ??F (37.1 ??C) (!) 106 18 140/88 mmHg 98 %     No intake or output data in the 24 hours ending 06/04/14 0932     PHYSICAL EXAM:  General: WD, WN. Alert, cooperative, no acute distress????  EENT:  EOMI. Anicteric sclerae. MMM  Resp:  CTA bilaterally, no wheezing or rales.  No accessory muscle use  CV:  Regular  rhythm,?? No edema  GI:  Soft, Non distended, Non tender. ??+Bowel sounds  Neurologic:?? Alert and oriented X 3, normal speech,   Psych:???? Good insight.??Not anxious nor agitated  Skin:  No rashes.  No jaundice left forearm swelling and redness. Radial pulse palpable left hand    Reviewed most current lab test results and cultures  YES  Reviewed most current radiology test results   YES  Review and summation of old records  today    NO  Reviewed patient's current orders and MAR    YES  PMH/SH reviewed - no change compared to H&P  ________________________________________________________________________  Care Plan discussed with:    Comments   Patient x    Family      RN x    Care Manager     Consultant  x Vascular surgeon                     Multidiciplinary team rounds were held today with case manager, nursing, pharmacist and clinical coordinator.  Patient's plan of care was discussed; medications were reviewed and discharge planning was addressed.     ________________________________________________________________________  Total NON critical care TIME:  28   Minutes    Total CRITICAL CARE TIME Spent:   Minutes non procedure based      Comments   >50% of visit spent in counseling and coordination of care     ________________________________________________________________________  Theodora BlowWarit Kristof Nadeem, MD     Procedures: see electronic medical records for all procedures/Xrays and details which were not copied into this note but were reviewed prior to creation of Plan.      LABS:  I reviewed today's most current labs and imaging studies.   Pertinent labs include:  Recent Labs      06/03/14   1934   WBC  10.1   HGB  12.2   HCT  37.5   PLT  294     Recent Labs      06/03/14   1934   NA  141   K  3.9   CL  112*   CO2  21   GLU  131*   BUN  9   CREA  1.01   CA  8.6   ALB  3.8   TBILI  0.3   SGOT  24   ALT  32

## 2014-06-04 NOTE — ED Notes (Signed)
Unasyn infusion initiated. Pt resting in position of comfort, in NAD. Will continue to monitor.

## 2014-06-04 NOTE — Progress Notes (Signed)
TRANSFER - OUT REPORT:    Verbal report given to Claudia on Jeanett SchleinBarbara Whidby  for routine progression of care       Report consisted of patient???s Situation, Background, Assessment and   Recommendations(SBAR).     Information from the following report(s) SBAR, Kardex, ED Summary and Recent Results was reviewed with the receiving nurse.    Opportunity for questions and clarification was provided.

## 2014-06-04 NOTE — Progress Notes (Signed)
[facility] Vascular Lab Technologist Preliminary Report:  Venous Duplex Arm    Left arm venous duplex was performed.    Jugular Vein, Subclavian Vein, Axillary Vein, Brachial Vein, Radial Vein, Ulnar Vein and Basilic Vein segment is Compressible with Normal Doppler signals, suggesting No venous obstruction of the aforementioned vessel(s).    Cephalic Vein (antecubital fossa, forearm) segment is Incompressible with Absent Doppler signals, suggesting Occlusive Superficial Venous obstruction.  Cephalic Vein (arm) segment not visualized.    Final report to follow.

## 2014-06-04 NOTE — Other (Signed)
TRANSFER - OUT REPORT:    Verbal report given to Jackson SouthCatherine RN on Meghan FuchsBarbara J [name]  being transferred  for routine progression of care       Report consisted of patient???s Situation, Background, Assessment and   Recommendations(SBAR).     Information from the following report(s) SBAR and ED Summary was reviewed with the receiving nurse.    Lines:   Peripheral IV 06/03/14 Right Hand (Active)   Site Assessment Clean, dry, & intact 06/03/2014  8:11 PM   Phlebitis Assessment 0 06/03/2014  8:11 PM   Infiltration Assessment 0 06/03/2014  8:11 PM   Dressing Status New 06/03/2014  8:11 PM   Dressing Type Tape;Transparent 06/03/2014  8:11 PM   Hub Color/Line Status Capped 06/03/2014  8:11 PM        Opportunity for questions and clarification was provided.      Patient transported with:

## 2014-06-05 LAB — CBC WITH AUTOMATED DIFF
ABS. BASOPHILS: 0 10*3/uL (ref 0.0–0.1)
ABS. EOSINOPHILS: 0.2 10*3/uL (ref 0.0–0.4)
ABS. LYMPHOCYTES: 2 10*3/uL (ref 0.8–3.5)
ABS. MONOCYTES: 0.6 10*3/uL (ref 0.0–1.0)
ABS. NEUTROPHILS: 2.9 10*3/uL (ref 1.8–8.0)
BASOPHILS: 0 % (ref 0–1)
EOSINOPHILS: 4 % (ref 0–7)
HCT: 35.4 % (ref 35.0–47.0)
HGB: 11.3 g/dL — ABNORMAL LOW (ref 11.5–16.0)
LYMPHOCYTES: 35 % (ref 12–49)
MCH: 29.5 PG (ref 26.0–34.0)
MCHC: 31.9 g/dL (ref 30.0–36.5)
MCV: 92.4 FL (ref 80.0–99.0)
MONOCYTES: 10 % (ref 5–13)
NEUTROPHILS: 51 % (ref 32–75)
PLATELET: 347 10*3/uL (ref 150–400)
RBC: 3.83 M/uL (ref 3.80–5.20)
RDW: 13.7 % (ref 11.5–14.5)
WBC: 5.6 10*3/uL (ref 3.6–11.0)

## 2014-06-05 LAB — GLUCOSE, POC: Glucose (POC): 94 mg/dL (ref 65–100)

## 2014-06-05 LAB — HEMOGLOBIN A1C WITH EAG
Est. average glucose: 137 mg/dL
Hemoglobin A1c: 6.4 % — ABNORMAL HIGH (ref 4.2–6.3)

## 2014-06-05 MED ORDER — AMOXICILLIN CLAVULANATE 875 MG-125 MG TAB
875-125 mg | ORAL_TABLET | Freq: Two times a day (BID) | ORAL | Status: AC
Start: 2014-06-05 — End: 2014-06-08

## 2014-06-05 MED FILL — NICOTINE 7 MG/24 HR DAILY PATCH: 7 mg/24 hr | TRANSDERMAL | Qty: 1

## 2014-06-05 MED FILL — OXYCODONE-ACETAMINOPHEN 5 MG-325 MG TAB: 5-325 mg | ORAL | Qty: 1

## 2014-06-05 MED FILL — PHENYTOIN SODIUM EXTENDED 100 MG CAP: 100 mg | ORAL | Qty: 3

## 2014-06-05 MED FILL — VIMPAT 100 MG TABLET: 100 mg | ORAL | Qty: 1

## 2014-06-05 MED FILL — AMPICILLIN-SULBACTAM 3 GRAM SOLUTION FOR INJECTION: 3 gram | INTRAMUSCULAR | Qty: 1

## 2014-06-05 MED FILL — CITALOPRAM 20 MG TAB: 20 mg | ORAL | Qty: 1

## 2014-06-05 MED FILL — CHILDREN'S ASPIRIN 81 MG CHEWABLE TABLET: 81 mg | ORAL | Qty: 1

## 2014-06-05 MED FILL — BD POSIFLUSH NORMAL SALINE 0.9 % INJECTION SYRINGE: INTRAMUSCULAR | Qty: 10

## 2014-06-05 MED FILL — LOVENOX 40 MG/0.4 ML SUBCUTANEOUS SYRINGE: 40 mg/0.4 mL | SUBCUTANEOUS | Qty: 0.4

## 2014-06-05 MED FILL — TOPIRAMATE 100 MG TAB: 100 mg | ORAL | Qty: 2

## 2014-06-05 NOTE — Progress Notes (Signed)
TRANSFER - IN REPORT:    Verbal report received from Oakdale[place]antha on Meghan SchleinBarbara [name] for routine progression of care.    Report consisted of patient???s Situation, Background, Assessment and   Recommendations(SBAR).     Information from the following report(s) SBAR, Kardex, ED Summary and Recent Results was reviewed.    Opportunity for questions and clarification was provided.

## 2014-06-05 NOTE — Progress Notes (Signed)
Call from MD. Per MD check a CBC this morning.   Notified MD that patient has been refusing sticks overnight, but nurse will attempt.

## 2014-06-05 NOTE — Progress Notes (Signed)
IV removed, discharge instructions given and signed copy placed on chart. Patient left accompanied by family member.

## 2014-06-05 NOTE — Progress Notes (Signed)
2330: Bedside and Verbal shift change report given to C. Timoteo AceBagley, RN (Cabin crewoncoming nurse) by Kathie RhodesS. Lindell NoeGooding, RN Physiological scientist(offgoing nurse). Report included the following information SBAR, ED Summary, Procedure Summary, Intake/Output, MAR, Recent Results and Cardiac Rhythm NSR.   0340: Pt refused to have labwork done. Will notify MD in the am.

## 2014-06-05 NOTE — Progress Notes (Addendum)
Call from Dr. Horace PorteousJithpratuck, per MD schedule follow up appointment with PCP for Monday. Also patient does not need last dose of vancomycin before discharge today.    Attempted to call Dr. Betsy PriesBelle's office at [phone]. Per message office is not open and mail box is full and unable to leave message. Instructed patient to call once office opens Monday morning to schedule same day appointment.    Bedside shift change report given to Dois DavenportSandra (Cabin crewoncoming nurse) by Doreatha MartinSam (offgoing nurse). Report included the following information SBAR, MAR and Recent Results.

## 2014-06-05 NOTE — Discharge Summary (Signed)
Hospitalist Discharge Summary     Patient ID:  Christiana FuchsBarbara J Bunten  [id number]  68 y.o.  1946-03-04    PCP on record: Tenna Delaineheryl C Belle, MD    Admit date: 06/03/2014  Discharge date and time: 06/05/2014      DISCHARGE DIAGNOSIS:  Left arm superficial thrombophebitis  Seizure disorder  Hypertension  Depression    CONSULTATIONS:  IP CONSULT TO NEUROLOGY  IP CONSULT TO VASCULAR SURGERY    Excerpted HPI from H&P of Gaspar SkeetersKyle D Compton, MD:  Jeanett SchleinBarbara Fickel is a 68 y.o. female who presents with  Complaints of left forearm swelling and redness, she was recently admitted here for eval of seizures  That admission was 10/17 - 10/19 and meds were adjusted, she thinks the left arm was swelling at DC  She says the swelling continued to worsen and she developed some redness and erythema,  There is no drainage, the area is painful, involves the area where he IV was and extends from there  She denies any fevers or chills, came back for eval and thought to have thrombophlebitis with cellulitis  Started on Vanc and RFA placed to Hospitalist medicine for further mgmt    ______________________________________________________________________  DISCHARGE SUMMARY/HOSPITAL COURSE:  for full details see H&P, daily progress notes, labs, consult notes.     Patient has been admitted with left arm swelling due to superficial thrombophlebitis. She has been treated with vancomycin and unsyn.  Ultrasound left arm did not show evidence of abscess but there was a concern of radial vein. Vascular surgeon was consulted and evaluated patient and recommend conservative management no need for anticoagulation. Her venous doppler ultrasound did not showed evidence of dvt. Showed only superficial thrombosis at cephalic vein.    Neurology was consulted with the concern of behavior changes. Base on their evaluation patient to cont taking her home regimen for seizure.  Patient has been cleared for discharge from vascular surgeon. Will give  augmentin bid for 3 days.   Overall condition is stable for discharge her left arm has much improved. Patient denied any chest pain no sob no fever. Recommend to follow up with primary care on Monday 06/08/14.  _______________________________________________________________________  Patient seen and examined by me on discharge day.  Pertinent Findings:  Gen:    Not in distress  Chest: Clear lungs  CVS:   Regular rhythm.  No edema  Abd:  Soft, not distended, not tender  Neuro:  Alert, awake  _______________________________________________________________________  DISCHARGE MEDICATIONS:   Current Discharge Medication List      START taking these medications    Details   amoxicillin-clavulanate (AUGMENTIN) 875-125 mg per tablet Take 1 Tab by mouth two (2) times a day for 3 days.  Qty: 6 Tab, Refills: 0         CONTINUE these medications which have NOT CHANGED    Details   phenytoin ER (DILANTIN ER) 300 mg ER capsule Take 1 Cap by mouth daily. Start 10/20  Qty: 30 Cap, Refills: 0      lacosamide (VIMPAT) 100 mg tab tablet Take 100 mg by mouth every twelve (12) hours.      fluticasone (FLONASE) 50 mcg/actuation nasal spray 2 Sprays by Both Nostrils route nightly.      aspirin 81 mg chewable tablet Take 81 mg by mouth daily.      citalopram (CELEXA) 20 mg tablet Take 20 mg by mouth daily.      topiramate (TOPAMAX) 100 mg tablet Take 200 mg by mouth  every twelve (12) hours.      diazepam (VALIUM) 5 mg tablet Take 1 Tab by mouth every twelve (12) hours as needed (spasm).  Qty: 10 Tab, Refills: 0      oxyCODONE-acetaminophen (PERCOCET) 5-325 mg per tablet Take 1 Tab by mouth every six (6) hours as needed for Pain.  Qty: 10 Tab, Refills: 0             My Recommended Diet, Activity, Wound Care, and follow-up labs are listed in the patient's Discharge Insturctions which I have personally completed and reviewed.    _______________________________________________________________________  DISPOSITION:    Home with Family: x    Home with HH/PT/OT/RN:    SNF/LTC:    SAHR:    OTHER:        Condition at Discharge:  Stable  _______________________________________________________________________  Follow up with:   PCP : Tenna Delaineheryl C Belle, MD  Follow-up Information     Follow up With Details Comments Contact Info    Mallie Snooksheryl Belle, MD Schedule an appointment as soon as possible for a visit on 06/08/2014 follow thrombophebitis [address]    [place]  [place] [id number]  [phone]                Total time in minutes spent coordinating this discharge (includes going over instructions, follow-up, prescriptions, and preparing report for sign off to her PCP) :  31 minutes    Signed:  Theodora BlowWarit [name], MD

## 2014-06-08 NOTE — Procedures (Signed)
[facility]  *** FINAL REPORT ***    Name: Meghan [name], Meghan [name]  MRN: [id number]    Inpatient  DOB: 03 May 1946  HIS Order #: [id number]  TRAKnet Visit #: [id number]  Date: 04 Jun 2014    TYPE OF TEST: Peripheral Venous Testing    REASON FOR TEST  R/O DVT    Left Arm:-  Deep venous thrombosis:           No  Superficial venous thrombosis:    Yes      INTERPRETATION/FINDINGS  Left arm :  1. Deep vein(s) visualized include the internal jugular, subclavian,  axillary, brachial, radial and ulnar veins.  2. No evidence of deep venous thrombosis detected in the veins  visualized.  3. No evidence of deep vein thrombosis in the contralateral subclavian   vein.  4. Superficial vein(s) visualized include the basilic (upper arm),  basilic (forearm) and cephalic (forearm) veins.  5. Superficial venous thrombosis, without involvement of the deep  system. The thrombus involves the cephalic (forearm) vein and  antecubital fossa.    ADDITIONAL COMMENTS    I have personally reviewed the data relevant to the interpretation of  this  study.    TECHNOLOGIST: David Charbonneau, RDMS  Signed: 06/04/2014 01:59 PM    PHYSICIAN: Lupe Bonner, MD  Signed: 06/08/2014 07:34 AM

## 2014-06-08 NOTE — Procedures (Signed)
[facility]  *** FINAL REPORT ***    Name: Meghan [name], Meghan [name]  MRN: [id number]    Inpatient  DOB: 03 May 1946  HIS Order #: [id number]  TRAKnet Visit #: [id number]  Date: 04 Jun 2014    TYPE OF TEST: Peripheral Venous Testing    REASON FOR TEST  R/O DVT    Left Arm:-  Deep venous thrombosis:           No  Superficial venous thrombosis:    Yes      INTERPRETATION/FINDINGS  Left arm :  1. Deep vein(s) visualized include the internal jugular, subclavian,  axillary, brachial, radial and ulnar veins.  2. No evidence of deep venous thrombosis detected in the veins  visualized.  3. No evidence of deep vein thrombosis in the contralateral subclavian   vein.  4. Superficial vein(s) visualized include the basilic (upper arm),  basilic (forearm) and cephalic (forearm) veins.  5. Superficial venous thrombosis, without involvement of the deep  system. The thrombus involves the cephalic (forearm) vein and  antecubital fossa.    ADDITIONAL COMMENTS    I have personally reviewed the data relevant to the interpretation of  this  study.    TECHNOLOGIST: Marijo Sanesavid Charbonneau, RDMS  Signed: 06/04/2014 01:59 PM    PHYSICIAN: Myles RosenthalJeffrey Chester Sibert, MD  Signed: 06/08/2014 07:34 AM

## 2014-06-09 LAB — CULTURE, BLOOD
Culture result:: NO GROWTH
Culture result:: NO GROWTH

## 2014-06-19 ENCOUNTER — Inpatient Hospital Stay
Admit: 2014-06-19 | Discharge: 2014-06-19 | Disposition: A | Discharge status: Home / Self Care | Payer: MEDICARE | Attending: Emergency Medicine

## 2014-06-19 DIAGNOSIS — T8172XA Complication of vein following a procedure, not elsewhere classified, initial encounter: Secondary | ICD-10-CM

## 2014-06-19 MED ORDER — IBUPROFEN 800 MG TAB
800 mg | ORAL_TABLET | Freq: Three times a day (TID) | ORAL | Status: AC | PRN
Start: 2014-06-19 — End: 2014-06-26

## 2014-06-19 MED ORDER — CEPHALEXIN 500 MG CAP
500 mg | ORAL_CAPSULE | Freq: Three times a day (TID) | ORAL | Status: DC
Start: 2014-06-19 — End: 2016-12-21

## 2014-06-19 MED ORDER — OXYCODONE-ACETAMINOPHEN 5 MG-325 MG TAB
5-325 mg | ORAL_TABLET | Freq: Four times a day (QID) | ORAL | Status: DC | PRN
Start: 2014-06-19 — End: 2016-12-24

## 2014-06-19 NOTE — ED Provider Notes (Signed)
The history is provided by the patient. No language interpreter was used.      Pt is 68 yof ambulatory to ER with c/o Left wrist pain ongoing since being discharged as inpatient from hospital. Pain is in area of inpatient IV site.     Pt denies fever/chills, n/v, known injury /trauma/fall.     Pt with no other c/o or concerns today. Pain is 7/10 scale.       PMH: seizures, HTN, depression  PMD: Dr Norberto SorensonBelle  Allergies; keppra  Pt states + tobacco, - alcohol  Written by Mickey FarberJudy Fuller, ED Scribe, as dictated by PA Ronnesha Mester.    Past Medical History   Diagnosis Date   ??? Seizures (HCC)    ??? Hypertension    ??? Psychiatric disorder      depression   ;     Past Surgical History   Procedure Laterality Date   ??? Hx gyn       hysterectomy         History reviewed. No pertinent family history.     History     Social History   ??? Marital Status: SINGLE     Spouse Name: [name]     Number of Children: [name]   ??? Years of Education: [name]     Occupational History   ??? Not on file.     Social History Main Topics   ??? Smoking status: Current Every Day Smoker   ??? Smokeless tobacco: Not on file   ??? Alcohol Use: No   ??? Drug Use: Not on file   ??? Sexual Activity: Not on file     Other Topics Concern   ??? Not on file     Social History Narrative                ALLERGIES: Keppra      Review of Systems   Constitutional: Negative for fever and chills.   HENT: Negative.    Respiratory: Negative.    Cardiovascular: Negative.    Gastrointestinal: Negative for nausea and vomiting.   Genitourinary: Negative.    Musculoskeletal: Positive for arthralgias.        + L wrist pain in area of recent IV site   Skin:        + L wrist pain ongoing in site of recent IV placement   Neurological: Negative for numbness.        Denies numbness/tingling L hand/fingers   All other systems reviewed and are negative.      There were no vitals filed for this visit.         Physical Exam   Constitutional: She is oriented to person, place, and time. She appears  well-developed and well-nourished. No distress.   HENT:   Head: Normocephalic and atraumatic.   Right Ear: External ear normal.   Left Ear: External ear normal.   Nose: Nose normal.   Mouth/Throat: Oropharynx is clear and moist. No oropharyngeal exudate.   Eyes: Conjunctivae and EOM are normal. Pupils are equal, round, and reactive to light. Right eye exhibits no discharge. Left eye exhibits no discharge. No scleral icterus.   Neck: Normal range of motion. Neck supple. No JVD present. No tracheal deviation present.   Cardiovascular: Normal rate, regular rhythm, normal heart sounds and intact distal pulses.  Exam reveals no gallop and no friction rub.    No murmur heard.  Pulmonary/Chest: Effort normal and breath sounds normal. No respiratory distress. She has no wheezes.  She has no rales. She exhibits no tenderness.   Abdominal: Soft. Bowel sounds are normal. She exhibits no distension and no mass. There is no tenderness. There is no rebound and no guarding.   Musculoskeletal: Normal range of motion. She exhibits no edema or tenderness.   Tenderness left wrist at prior IV site, no significant cellulitis/erythema or palpable chords, NVI distally, no deformity, NVI distally   Lymphadenopathy:     She has no cervical adenopathy.   Neurological: She is alert and oriented to person, place, and time. She has normal reflexes. No cranial nerve deficit. She exhibits normal muscle tone. Coordination normal.   Skin: Skin is warm and dry. No rash noted. She is not diaphoretic. No erythema.   Psychiatric: She has a normal mood and affect. Her behavior is normal. Judgment and thought content normal.   Nursing note and vitals reviewed.       MDM    Procedures    DISCHARGE NOTE:  10:51 AM  The patient's results have been reviewed with them and/or available family. Patient and/or family verbally conveyed their understanding and agreement of the patient's signs, symptoms, diagnosis, treatment and  prognosis and additionally agree to follow up as recommended in the discharge instructions or to return to the Emergency Room should their condition change prior to their follow-up appointment. The patient/family verbally agrees with the care-plan and verbally conveys that all of their questions have been answered. The discharge instructions have also been provided to the patient and/or family with some educational information regarding the patient's diagnosis as well a list of reasons why the patient would want to return to the ER prior to their follow-up appointment, should their condition change.      CLINICAL IMPRESSION:  1. Phlebitis after infusion, initial encounter        Plan:  1. Take ER prescribed meds as directed: keflex, motrin, percocet  2. F/u with Dr Mallie Snooksheryl Belle, PMD, x 3 days as needed  3.Follow printed instructions as presented in discharge paperwork.  Return to the ED for any deterioration  Pt is ready to go home.Written by Mickey FarberJudy Fuller, ED Scribe, as dictated by PA Brooklinn Longbottom.

## 2014-06-19 NOTE — ED Notes (Signed)
Pt comes in for complaint of left wrist pain since a stay in the hospital in October. The pain is at the site of an IV insertion.  Pain is not responsive to ibuprofen.  The patient presents for further evaluation and treatment.  The patient is placed in a position of comfort with call bell in reach.

## 2014-06-19 NOTE — ED Notes (Signed)
C Chrzanowski PA-C reviewed discharge instructions with the patient.  The patient verbalized understanding.  All questions and concerns were addressed.  The patient is discharged ambulatory with instructions and prescriptions in hand.  Pt is alert and oriented x 4.  Respirations are clear and unlabored.

## 2014-08-03 ENCOUNTER — Telehealth: Payer: Self-pay | Admitting: Internal Medicine

## 2014-08-03 NOTE — Telephone Encounter (Signed)
PATIENT CALLED TO SCHEDULE A COLONOSCOPY, NO PROBLEMS, PCP TOLD HER SHE WAS DUE  [phone]

## 2014-08-11 ENCOUNTER — Other Ambulatory Visit: Payer: Self-pay

## 2014-08-11 ENCOUNTER — Telehealth: Payer: Self-pay

## 2014-08-11 DIAGNOSIS — Z1211 Encounter for screening for malignant neoplasm of colon: Secondary | ICD-10-CM

## 2014-08-11 NOTE — Telephone Encounter (Signed)
Pt had forgot to tell me that she uses C-pap at night. I told her she will have to take her settings for the C-pap with her to the hospital when she has procedure.   Rx sent to the pharmacy and instructions mailed to pt.

## 2014-08-11 NOTE — Telephone Encounter (Signed)
Ok to schedule. Thanks

## 2014-08-11 NOTE — Telephone Encounter (Signed)
See separate triage.  

## 2014-08-11 NOTE — Telephone Encounter (Signed)
Gastroenterology Pre-Procedure Review  Request Date: 08/11/2014 Requesting Physician: Dr. Huston FoleySaima Athar   ( Pt also received a letter in 07/2011, on the recall)  Last colonoscopy was 10/14/2001 by Dr. Jena Gaussourk  PATIENT REVIEW QUESTIONS: The patient responded to the following health history questions as indicated:    1. Diabetes Melitis: no 2. Joint replacements in the past 12 months: no 3. Major health problems in the past 3 months: no 4. Has an artificial valve or MVP: no 5. Has a defibrillator: no 6. Has been advised in past to take antibiotics in advance of a procedure like teeth cleaning: no    MEDICATIONS & ALLERGIES:    Patient reports the following regarding taking any blood thinners:   Plavix? no Aspirin? no Coumadin? no  Patient confirms/reports the following medications:  Current Outpatient Prescriptions  Medication Sig Dispense Refill  . amLODipine (NORVASC) 2.5 MG tablet Take 2.5 mg by mouth daily.    . Calcium Carbonate-Vitamin D (CALTRATE 600+D PO) Take by mouth.    . fluticasone (FLONASE) 50 MCG/ACT nasal spray Place 1-2 sprays into both nostrils daily.    . irbesartan (AVAPRO) 300 MG tablet Take 1 tablet by mouth daily.    Marland Kitchen. allopurinol (ZYLOPRIM) 100 MG tablet Take 1 tablet by mouth daily.    Marland Kitchen. omeprazole (PRILOSEC) 20 MG capsule Take 1 capsule by mouth daily. Takes only as needed     No current facility-administered medications for this visit.    Patient confirms/reports the following allergies:  No Known Allergies  No orders of the defined types were placed in this encounter.    AUTHORIZATION INFORMATION Primary Insurance:   ID #:   Group #:  Pre-Cert / Auth required:  Pre-Cert / Auth #:   Secondary Insurance:   ID #:  Group #:  Pre-Cert / Auth required:  Pre-Cert / Auth #:   SCHEDULE INFORMATION: Procedure has been scheduled as follows:  Date:   08/31/2014               Time:  8:30 Am Location: [facility] Short Stay  This Gastroenterology  Pre-Precedure Review Form is being routed to the following provider(s): R. Roetta SessionsMichael Rourk, MD

## 2014-08-12 NOTE — Telephone Encounter (Signed)
Noted, thanks!

## 2014-08-13 MED ORDER — PEG-KCL-NACL-NASULF-NA ASC-C 100 G PO SOLR: 1.0000 | ORAL | Status: DC

## 2014-08-18 ENCOUNTER — Telehealth: Payer: Self-pay

## 2014-08-18 MED ORDER — PEG 3350-KCL-NA BICARB-NACL 420 G PO SOLR: 4000.0000 mL | ORAL | Status: DC

## 2014-08-18 NOTE — Telephone Encounter (Signed)
Pt called and the Movie prep is too expensive so I am sending in [place] and mailing her new instructions.

## 2014-08-27 ENCOUNTER — Telehealth: Payer: Self-pay

## 2014-08-27 NOTE — Telephone Encounter (Signed)
I called [employer] ( Pt is not [place]) . [phone] I got the automation service and a PA is not required for the screening colonoscopy.

## 2014-08-31 ENCOUNTER — Encounter (HOSPITAL_COMMUNITY): Payer: Self-pay | Admitting: *Deleted

## 2014-08-31 ENCOUNTER — Ambulatory Visit (HOSPITAL_COMMUNITY)
Admission: RE | Admit: 2014-08-31 | Discharge: 2014-08-31 | Disposition: A | Discharge status: Home / Self Care | Payer: Medicare PPO | Source: Ambulatory Visit | Attending: Internal Medicine | Admitting: Internal Medicine

## 2014-08-31 ENCOUNTER — Encounter (HOSPITAL_COMMUNITY)
Admission: RE | Disposition: A | Discharge status: Home / Self Care | Payer: Self-pay | Source: Ambulatory Visit | Attending: Internal Medicine

## 2014-08-31 DIAGNOSIS — Z87891 Personal history of nicotine dependence: Secondary | ICD-10-CM | POA: Insufficient documentation

## 2014-08-31 DIAGNOSIS — Z1211 Encounter for screening for malignant neoplasm of colon: Secondary | ICD-10-CM | POA: Diagnosis present

## 2014-08-31 DIAGNOSIS — K5730 Diverticulosis of large intestine without perforation or abscess without bleeding: Secondary | ICD-10-CM | POA: Diagnosis not present

## 2014-08-31 DIAGNOSIS — G4733 Obstructive sleep apnea (adult) (pediatric): Secondary | ICD-10-CM | POA: Insufficient documentation

## 2014-08-31 DIAGNOSIS — M109 Gout, unspecified: Secondary | ICD-10-CM | POA: Diagnosis not present

## 2014-08-31 DIAGNOSIS — K219 Gastro-esophageal reflux disease without esophagitis: Secondary | ICD-10-CM | POA: Diagnosis not present

## 2014-08-31 DIAGNOSIS — K6389 Other specified diseases of intestine: Secondary | ICD-10-CM | POA: Diagnosis not present

## 2014-08-31 DIAGNOSIS — I10 Essential (primary) hypertension: Secondary | ICD-10-CM | POA: Diagnosis not present

## 2014-08-31 HISTORY — PX: COLONOSCOPY: SHX5424

## 2014-08-31 SURGERY — COLONOSCOPY
Anesthesia: Moderate Sedation

## 2014-08-31 MED ORDER — SODIUM CHLORIDE 0.9 % IV SOLN
INTRAVENOUS | Status: DC
  Administered 2014-08-31: 08:00:00 via INTRAVENOUS

## 2014-08-31 MED ORDER — ATROPINE SULFATE 1 MG/ML IJ SOLN
INTRAMUSCULAR | Status: AC
  Filled 2014-08-31: qty 1

## 2014-08-31 MED ORDER — ONDANSETRON HCL 4 MG/2ML IJ SOLN
INTRAMUSCULAR | Status: DC | PRN
  Administered 2014-08-31: 4 mg via INTRAVENOUS

## 2014-08-31 MED ORDER — MEPERIDINE HCL 100 MG/ML IJ SOLN
INTRAMUSCULAR | Status: DC | PRN
  Administered 2014-08-31 (×3): 25 mg via INTRAVENOUS

## 2014-08-31 MED ORDER — MIDAZOLAM HCL 5 MG/5ML IJ SOLN
INTRAMUSCULAR | Status: DC | PRN
  Administered 2014-08-31 (×2): 1 mg via INTRAVENOUS
  Administered 2014-08-31: 2 mg via INTRAVENOUS

## 2014-08-31 MED ORDER — MIDAZOLAM HCL 5 MG/5ML IJ SOLN
INTRAMUSCULAR | Status: AC
  Filled 2014-08-31: qty 10

## 2014-08-31 MED ORDER — MEPERIDINE HCL 100 MG/ML IJ SOLN
INTRAMUSCULAR | Status: AC
  Filled 2014-08-31: qty 2

## 2014-08-31 MED ORDER — STERILE WATER FOR IRRIGATION IR SOLN
Status: DC | PRN
  Administered 2014-08-31: 08:00:00

## 2014-08-31 MED ORDER — ONDANSETRON HCL 4 MG/2ML IJ SOLN
INTRAMUSCULAR | Status: AC
  Filled 2014-08-31: qty 2

## 2014-08-31 NOTE — Discharge Instructions (Signed)
°Colonoscopy °Discharge Instructions ° °Read the instructions outlined below and refer to this sheet in the next few weeks. These discharge instructions provide you with general information on caring for yourself after you leave the hospital. Your doctor may also give you specific instructions. While your treatment has been planned according to the most current medical practices available, unavoidable complications occasionally occur. If you have any problems or questions after discharge, call Dr. Rourk at [phone]. °ACTIVITY °· You may resume your regular activity, but move at a slower pace for the next 24 hours.  °· Take frequent rest periods for the next 24 hours.  °· Walking will help get rid of the air and reduce the bloated feeling in your belly (abdomen).  °· No driving for 24 hours (because of the medicine (anesthesia) used during the test).   °· Do not sign any important legal documents or operate any machinery for 24 hours (because of the anesthesia used during the test).  °NUTRITION °· Drink plenty of fluids.  °· You may resume your normal diet as instructed by your doctor.  °· Begin with a light meal and progress to your normal diet. Heavy or fried foods are harder to digest and may make you feel sick to your stomach (nauseated).  °· Avoid alcoholic beverages for 24 hours or as instructed.  °MEDICATIONS °· You may resume your normal medications unless your doctor tells you otherwise.  °WHAT YOU CAN EXPECT TODAY °· Some feelings of bloating in the abdomen.  °· Passage of more gas than usual.  °· Spotting of blood in your stool or on the toilet paper.  °IF YOU HAD POLYPS REMOVED DURING THE COLONOSCOPY: °· No aspirin products for 7 days or as instructed.  °· No alcohol for 7 days or as instructed.  °· Eat a soft diet for the next 24 hours.  °FINDING OUT THE RESULTS OF YOUR TEST °Not all test results are available during your visit. If your test results are not back during the visit, make an appointment  with your caregiver to find out the results. Do not assume everything is normal if you have not heard from your caregiver or the medical facility. It is important for you to follow up on all of your test results.  °SEEK IMMEDIATE MEDICAL ATTENTION IF: °· You have more than a spotting of blood in your stool.  °· Your belly is swollen (abdominal distention).  °· You are nauseated or vomiting.  °· You have a temperature over 101.  °· You have abdominal pain or discomfort that is severe or gets worse throughout the day.  ° ° °Diverticulosis information provided ° °Repeat screening colonoscopy in 10 years ° °Diverticulosis °Diverticulosis is the condition that develops when small pouches (diverticula) form in the wall of your colon. Your colon, or large intestine, is where water is absorbed and stool is formed. The pouches form when the inside layer of your colon pushes through weak spots in the outer layers of your colon. °CAUSES  °No one knows exactly what causes diverticulosis. °RISK FACTORS °· Being older than 50. Your risk for this condition increases with age. Diverticulosis is rare in people younger than 40 years. By age 80, almost everyone has it. °· Eating a low-fiber diet. °· Being frequently constipated. °· Being overweight. °· Not getting enough exercise. °· Smoking. °· Taking over-the-counter pain medicines, like aspirin and ibuprofen. °SYMPTOMS  °Most people with diverticulosis do not have symptoms. °DIAGNOSIS  °Because diverticulosis often has no symptoms,   health care providers often discover the condition during an exam for other colon problems. In many cases, a health care provider will diagnose diverticulosis while using a flexible scope to examine the colon (colonoscopy). °TREATMENT  °If you have never developed an infection related to diverticulosis, you may not need treatment. If you have had an infection before, treatment may include: °· Eating more fruits, vegetables, and grains. °· Taking a fiber  supplement. °· Taking a live bacteria supplement (probiotic). °· Taking medicine to relax your colon. °HOME CARE INSTRUCTIONS  °· Drink at least 6-8 glasses of water each day to prevent constipation. °· Try not to strain when you have a bowel movement. °· Keep all follow-up appointments. °If you have had an infection before:  °· Increase the fiber in your diet as directed by your health care provider or dietitian. °· Take a dietary fiber supplement if your health care provider approves. °· Only take medicines as directed by your health care provider. °SEEK MEDICAL CARE IF:  °· You have abdominal pain. °· You have bloating. °· You have cramps. °· You have not gone to the bathroom in 3 days. °SEEK IMMEDIATE MEDICAL CARE IF:  °· Your pain gets worse. °· Your bloating becomes very bad. °· You have a fever or chills, and your symptoms suddenly get worse. °· You begin vomiting. °· You have bowel movements that are bloody or black. °MAKE SURE YOU: °· Understand these instructions. °· Will watch your condition. °· Will get help right away if you are not doing well or get worse. °Document Released: 04/27/2004 Document Revised: 08/05/2013 Document Reviewed: 06/25/2013 °ExitCare® Patient Information ©2015 [employer]. This information is not intended to replace advice given to you by your health care provider. Make sure you discuss any questions you have with your health care provider. ° °

## 2014-08-31 NOTE — H&P (Signed)
_0 @   Primary Care Physician:  Tivis Ringer, MD Primary Gastroenterologist:  Dr. Gala Romney  Pre-Procedure History & Physical: HPI:  Angelica Mcconnell is a 69 y.o. female is here for a screening colonoscopy. Negative colonoscopy 2003. No bowel symptoms. No family history of colon cancer.  Past Medical History  Diagnosis Date  . HTN (hypertension)   . Reflux   . Gout   . Sinus infection   . OSA (obstructive sleep apnea) 07/15/2013    Past Surgical History  Procedure Laterality Date  . Colonoscopy      Prior to Admission medications   Medication Sig Start Date End Date Taking? Authorizing Provider  allopurinol (ZYLOPRIM) 100 MG tablet Take 1 tablet by mouth daily. 05/07/13  Yes Historical Provider, MD  amLODipine (NORVASC) 2.5 MG tablet Take 2.5 mg by mouth daily.   Yes Historical Provider, MD  Calcium Carbonate-Vitamin D (CALTRATE 600+D PO) Take 1 tablet by mouth daily.    Yes Historical Provider, MD  fluticasone (FLONASE) 50 MCG/ACT nasal spray Place 1-2 sprays into both nostrils daily. 06/06/13  Yes Historical Provider, MD  irbesartan (AVAPRO) 300 MG tablet Take 1 tablet by mouth daily. 07/09/13  Yes Historical Provider, MD  omeprazole (PRILOSEC) 20 MG capsule Take 20 mg by mouth daily as needed. Takes only as needed 05/16/13  Yes Historical Provider, MD  peg 3350 powder (MOVIPREP) 100 G SOLR Take 1 kit (200 g total) by mouth as directed. 08/13/14  Yes Daneil Dolin, MD  polyethylene glycol-electrolytes (TRILYTE) 420 G solution Take 4,000 mLs by mouth as directed. 08/18/14  Yes Daneil Dolin, MD    Allergies as of 08/11/2014  . (No Known Allergies)    Family History  Problem Relation Age of Onset  . Heart failure Mother   . Diabetes Mother   . Thyroid disease Mother     History   Social History  . Marital Status: Single    Spouse Name: [name]    Number of Children: [name]  . Years of Education: [name]   Occupational History  . Not on file.   Social History Main  Topics  . Smoking status: Former Smoker    Quit date: 07/15/1989  . Smokeless tobacco: Not on file  . Alcohol Use: No  . Drug Use: No  . Sexual Activity: Not on file   Other Topics Concern  . 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      Review of Systems: See HPI, otherwise negative ROS  Physical Exam: BP 138/83 mmHg  Pulse 68  Temp(Src) 98 F (36.7 C)  Resp 18  Ht _1  (1.6 m)  Wt 250 lb (113.399 kg)  BMI 44.30 kg/m2  SpO2 99% General:   Alert,  Well-developed, well-nourished, pleasant and cooperative in NAD Head:  Normocephalic and atraumatic. Eyes:  Sclera clear, no icterus.   Conjunctiva pink. Ears:  Normal auditory acuity. Nose:  No deformity, discharge,  or lesions. Mouth:  No deformity or lesions, dentition normal. Neck:  Supple; no masses or thyromegaly. Lungs:  Clear throughout to auscultation.   No wheezes, crackles, or rhonchi. No acute distress. Heart:  Regular rate and rhythm; no murmurs, clicks, rubs,  or gallops. Abdomen:  Soft, nontender and nondistended. No masses, hepatosplenomegaly or hernias noted. Normal bowel sounds, without guarding, and without rebound.   Msk:  Symmetrical without gross deformities. Normal posture. Pulses:  Normal pulses noted.  Extremities:  Without clubbing or edema. Neurologic:  Alert and  oriented x4;  grossly normal neurologically. Skin:  Intact without significant lesions or rashes. Cervical Nodes:  No significant cervical adenopathy. Psych:  Alert and cooperative. Normal mood and affect.  Impression/Plan: Angelica Mcconnell is now here to undergo a screening colonoscopy.  Average risk colorectal cancer screening examination.  Risks, benefits, limitations, imponderables and alternatives regarding colonoscopy have been reviewed with the patient. Questions have been answered. All parties agreeable.     Notice:  This dictation was prepared with  Dragon dictation along with smaller phrase technology. Any transcriptional errors that result from this process are unintentional and may not be corrected upon review.

## 2014-08-31 NOTE — Op Note (Signed)
[facility] [address] [place], [id number]   COLONOSCOPY PROCEDURE REPORT  PATIENT: Angelica Mcconnell, Angelica Mcconnell  MR#: [id number] BIRTHDATE: 1946-04-26 , 68  yrs. old GENDER: female ENDOSCOPIST: R.  Roetta SessionsMichael Dejha King, MD [facility] LtdFACP FACG REFERRED BY:   Dr. Frances FurbishAthar PROCEDURE DATE:  08/31/2014 PROCEDURE:   Colonoscopy, screening INDICATIONS:Average risk colorectal cancer screening examination. MEDICATIONS: Versed 5 mg IV and Demerol 75 mg IV in divided doses. Zofran 4 mg IV ASA CLASS:       Class II  CONSENT: The risks, benefits, alternatives and imponderables including but not limited to bleeding, perforation as well as the possibility of Mcconnell missed lesion have been reviewed.  The potential for biopsy, lesion removal, etc. have also been discussed. Questions have been answered.  All parties agreeable.  Please see the history and physical in the medical record for more information.  DESCRIPTION OF PROCEDURE:   After the risks benefits and alternatives of the procedure were thoroughly explained, informed consent was obtained.  The digital rectal exam revealed no abnormalities of the rectum.   The EC-[id number] ([id number]  endoscope was introduced through the anus and advanced to the cecum, which was identified by both the appendix and ileocecal valve. No adverse events experienced.   The quality of the prep was adequate.  The instrument was then slowly withdrawn as the colon was fully examined.      COLON FINDINGS: Normal-appearing rectum.  Somewhat redundant colon requiring gentle external abdominal pressure and changing the patient's position once from left lateral decubitus to supine and then back to reach the cecum.  Patient had scattered left-sided diverticula; the remainder of the colonic mucosa appeared normal. Retroflexion was performed. .  Withdrawal time=9 minutes 0 seconds.  The scope was withdrawn and the procedure completed. COMPLICATIONS: There were no immediate  complications.  ENDOSCOPIC IMPRESSION: Redundant colon. Colonic diverticulosis  RECOMMENDATIONS: 1 more screening colonoscopy in 10 years.  eSigned:  R. Roetta SessionsMichael Karson Chicas, MD Jerrel IvoryFACP [facility]pt[facility] AssocFACG 08/31/2014 8:57 AM   cc:  CPT CODES: ICD CODES:  The ICD and CPT codes recommended by this software are interpretations from the data that the clinical staff has captured with the software.  The verification of the translation of this report to the ICD and CPT codes and modifiers is the sole responsibility of the health care institution and practicing physician where this report was generated.  [employer] will not be held responsible for the validity of the ICD and CPT codes included on this report.  AMA assumes no liability for data contained or not contained herein. CPT is [employer] rights managerregistered trademark of the [employer].  PATIENT NAME:  Angelica Mcconnell, Angelica Mcconnell MR#: [id number]

## 2014-09-01 ENCOUNTER — Encounter (HOSPITAL_COMMUNITY): Payer: Self-pay | Admitting: Internal Medicine

## 2014-09-16 DIAGNOSIS — G4733 Obstructive sleep apnea (adult) (pediatric): Secondary | ICD-10-CM | POA: Diagnosis not present

## 2014-10-15 DIAGNOSIS — G4733 Obstructive sleep apnea (adult) (pediatric): Secondary | ICD-10-CM | POA: Diagnosis not present

## 2015-03-22 DIAGNOSIS — Z1231 Encounter for screening mammogram for malignant neoplasm of breast: Secondary | ICD-10-CM | POA: Diagnosis not present

## 2015-03-22 DIAGNOSIS — Z01419 Encounter for gynecological examination (general) (routine) without abnormal findings: Secondary | ICD-10-CM | POA: Diagnosis not present

## 2015-04-13 DIAGNOSIS — Z6841 Body Mass Index (BMI) 40.0 and over, adult: Secondary | ICD-10-CM | POA: Diagnosis not present

## 2015-04-13 DIAGNOSIS — K219 Gastro-esophageal reflux disease without esophagitis: Secondary | ICD-10-CM | POA: Diagnosis not present

## 2015-04-13 DIAGNOSIS — E119 Type 2 diabetes mellitus without complications: Secondary | ICD-10-CM | POA: Diagnosis not present

## 2015-04-13 DIAGNOSIS — I10 Essential (primary) hypertension: Secondary | ICD-10-CM | POA: Diagnosis not present

## 2015-04-13 DIAGNOSIS — G4733 Obstructive sleep apnea (adult) (pediatric): Secondary | ICD-10-CM | POA: Diagnosis not present

## 2015-06-01 ENCOUNTER — Encounter: Payer: Self-pay | Admitting: Neurology

## 2015-06-01 ENCOUNTER — Ambulatory Visit (INDEPENDENT_AMBULATORY_CARE_PROVIDER_SITE_OTHER): Payer: Medicare PPO | Admitting: Neurology

## 2015-06-01 VITALS — BP 142/80 | HR 62 | Resp 16 | Ht 64.0 in | Wt 255.0 lb

## 2015-06-01 DIAGNOSIS — E669 Obesity, unspecified: Secondary | ICD-10-CM | POA: Diagnosis not present

## 2015-06-01 DIAGNOSIS — G4733 Obstructive sleep apnea (adult) (pediatric): Secondary | ICD-10-CM | POA: Diagnosis not present

## 2015-06-01 DIAGNOSIS — Z9989 Dependence on other enabling machines and devices: Principal | ICD-10-CM

## 2015-06-01 NOTE — Progress Notes (Signed)
Subjective:    Patient ID: Angelica Mcconnell is a 69 y.o. female.  HPI     Interim history:   Angelica Mcconnell is a very friendly 69 year old right-handed woman with an underlying medical history of obesity, hypertension, prior smoking, reflux disease, who presents for followup consultation of her obstructive sleep apnea treated with CPAP at a pressure of 6 cm with EPR of 3. The patient is unaccompanied today. I last saw her on 05/28/14, at which time Angelica Mcconnell was doing well, with no new symptoms. Angelica Mcconnell was compliant with treatment.  Today, 06/01/2015: I reviewed her CPAP compliance data from 04/30/2015 through 05/29/2015 which is a total of 30 days during which time Angelica Mcconnell used her machine every night with percent used days greater than 4 hours at 100%, indicating superb compliance with an average usage of 9 hours and 27 minutes, residual AHI low at 0.8 per hour, leak low with the 95th percentile at 4.5 L/m on a pressure of 6 cm.  Today, 06/01/2015: Angelica Mcconnell reports doing well, no new complaints, has had her check up with PCP in August. Has blood work once a year with him. Angelica Mcconnell does not have DM. Angelica Mcconnell lives alone. Daugher (67 yo), lives in town and [place] (67 yo) is at [facility] for his [facility]. Weight is stable, Dr. Dagmar Hait wants her to lose weight, Angelica Mcconnell admits.   Previously:   I saw her on 11/25/2013, at which time Angelica Mcconnell reported sleeping better with CPAP, waking up better rested and less nocturia. I congratulated her on her great compliance and encouraged her to continue using CPAP regularly.  I reviewed her compliance data from 04/26/2014 through 05/25/2014 which is a total of 30 days during which time Angelica Mcconnell used her CPAP every day. Percent used days greater than 4 hours was 97%, indicating excellent compliance, residual AHI low at 1.5 per hour, leak low at 6.9 L per minute at the 95th percentile, average usage of 7 hours and 36 minutes, pressure at 6 cm with EPR of 3.  I first met her on 07/15/2013, at which time Angelica Mcconnell reported  loud snoring, nonrestorative sleep, morning headaches and daytime somnolence. Angelica Mcconnell also reported bruxism, for which Angelica Mcconnell was using an over-the-counter bite guard. I advised her to return for sleep study. Angelica Mcconnell is at baseline sleep study followed by a CPAP titration study. I went over her test results in detail with her today. Her baseline sleep study from 08/05/2013 showed a sleep efficiency of 74.9% with a latency to sleep of 68.5 minutes and wake after sleep onset of 39 minutes with mild to moderate sleep fragmentation noted. Angelica Mcconnell had increased percentages of stage I and stage II sleep, I decreased percentage of slow-wave sleep and a mildly increased percentage of REM sleep with a normal REM latency. Angelica Mcconnell had mild snoring with rare loud snoring noted. Angelica Mcconnell had a total AHI of 5.4 per hour, rising to 17.4 per hour in REM sleep. Her baseline oxygen saturation was 92% with a nadir of 80%. Time below 88% saturation was 4 minutes and 4 seconds. Angelica Mcconnell was requested to return for a CPAP titration study. Angelica Mcconnell had this test on 09/11/2013. Sleep efficiency was 76.6% with a latency to sleep of 27 minutes and wake after sleep onset of 70 minutes with moderate sleep fragmentation noted. Angelica Mcconnell had an increased percentage of stage II sleep, I decreased percentage of deep sleep, and a near normal percentage of REM sleep with a mildly reduced REM latency. Angelica Mcconnell had occasional PVCs and PACs on  EKG. Of note, during the baseline sleep study Angelica Mcconnell had similar EKG changes. Snoring was eliminated with CPAP. Angelica Mcconnell was started on 5 cm and titrated to 7 cm. On 6 cm of pressure her AHI was 0 per hour. Pre-supine REM sleep was achieved. Based on the test results I prescribed CPAP for her.   I reviewed the patient's CPAP compliance data from 10/15/2013 to 11/13/2013, which is a total of 30 days, during which time the patient used CPAP every day. The average usage for all days was 9 hours and 1 minutes. The percent used days greater than 4 hours was 100%,  indicating superb compliance. The residual AHI was 1.6 per hour, indicating an appropriate treatment pressure with very little leak documented.   I reviewed her compliance data from 10/14/2013 through 11/24/2013 which is the last 42 days during which times he use CPAP every night with percent used days greater than 4 hours of 100%, indicating superb compliance. Average usage was 9 hours and 3 minutes with a residual AHI of 1.5/h. Leak was very low. Pressure is 6 cm with EPR of 3.   I reviewed the patient's CPAP compliance data from 01/14/2014 to 02/12/2014, which is a total of 30 days, during which time the patient used CPAP every day. The average usage for all days was 7 hours and 20 minutes. The percent used days greater than 4 hours was 90 %, indicating excellent compliance. The residual AHI was 1.3 per hour, indicating an appropriate treatment pressure of 6 cwp with EPR of 3. Air leak from the mask was low at 6.9 L per minute at the 95th percentile.  Her Past Medical History Is Significant For: Past Medical History  Diagnosis Date  . HTN (hypertension)   . Reflux   . Gout   . Sinus infection   . OSA (obstructive sleep apnea) 07/15/2013    Her Past Surgical History Is Significant For: Past Surgical History  Procedure Laterality Date  . Colonoscopy    . Colonoscopy [name] 08/31/2014    Procedure: COLONOSCOPY;  Surgeon: Daneil Dolin, MD;  Location: [facility];  Service: Endoscopy;  Laterality: [name];  8:30 AM    Her Family History Is Significant For: Family History  Problem Relation Age of Onset  . Heart failure Mother   . Diabetes Mother   . Thyroid disease Mother     Her Social History Is Significant For: Social History   Social History  . Marital Status: Single    Spouse Name: [name]  . Number of Children: [name]  . Years of Education: [name]   Social History Main Topics  . Smoking status: Former Smoker    Quit date: 07/15/1989  . Smokeless tobacco: None  . Alcohol Use: No  .  Drug Use: No  . Sexual Activity: Not Asked   Other Topics Concern  . 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      Her Allergies Are:  No Known Allergies:   Her Current Medications Are:  Outpatient Encounter Prescriptions as of 06/01/2015  Medication Sig  . allopurinol (ZYLOPRIM) 100 MG tablet Take 1 tablet by mouth daily.  Marland Kitchen amLODipine (NORVASC) 2.5 MG tablet Take 2.5 mg by mouth daily.  . Calcium Carbonate-Vitamin D (CALTRATE 600+D PO) Take 1 tablet by mouth daily.   . fluticasone (FLONASE) 50 MCG/ACT nasal spray Place 1-2 sprays into both nostrils daily.  . irbesartan (AVAPRO)  300 MG tablet Take 1 tablet by mouth daily.  Marland Kitchen omeprazole (PRILOSEC) 20 MG capsule Take 20 mg by mouth daily as needed. Takes only as needed  . [DISCONTINUED] peg 3350 powder (MOVIPREP) 100 G SOLR Take 1 kit (200 g total) by mouth as directed.  . [DISCONTINUED] polyethylene glycol-electrolytes (TRILYTE) 420 G solution Take 4,000 mLs by mouth as directed.   No facility-administered encounter medications on file as of 06/01/2015.  :  Review of Systems:  Out of a complete 14 point review of systems, all are reviewed and negative with the exception of these symptoms as listed below:   Review of Systems  Neurological:       Patient states that Angelica Mcconnell is doing well on CPAP, no new concerns     Objective:  Neurologic Exam  Physical Exam Physical Examination:   Filed Vitals:   06/01/15 1108  BP: 142/80  Pulse: 62  Resp: 16    General Examination: The patient is a very pleasant 69 y.o. female in no acute distress. Angelica Mcconnell appears well-developed and well-nourished and very well groomed.   HEENT: Normocephalic, atraumatic, pupils are equal, round and reactive to light and accommodation. S/p cataract repair on OD and mild cataract on OS. Extraocular tracking is good without limitation to gaze excursion or nystagmus noted. Normal smooth pursuit  is noted. Hearing is grossly intact. Face is symmetric with normal facial animation and normal facial sensation. Speech is clear with no dysarthria noted. There is no hypophonia. There is no lip, neck/head, jaw or voice tremor. Neck is supple with full range of passive and active motion. There are no carotid bruits on auscultation. Oropharynx exam reveals: mild mouth dryness, mild pharyngeal erythema, adequate dental hygiene and moderate airway crowding, due to large tongue, redundant soft palate and narrow airway entry. Mallampati is class III. Tongue protrudes centrally and palate elevates symmetrically. Tonsils are 1+.  Chest: normal breath sounds, no wheezing, rhonchi or crackles noted. Angelica Mcconnell has some coughing bouts.   Heart: S1+S2+0, regular and normal without murmurs, rubs or gallops noted.   Abdomen: Soft, non-tender and non-distended with normal bowel sounds appreciated on auscultation.  Extremities: There is no pitting edema in the distal lower extremities bilaterally. Pedal pulses are intact.  Skin: Warm and dry without trophic changes noted. There are no varicose veins.  Musculoskeletal: exam reveals no obvious joint deformities, tenderness or joint swelling or erythema.   Neurologically:  Mental status: The patient is awake, alert and oriented in all 4 spheres. Her memory, attention, language and knowledge are appropriate. There is no aphasia, agnosia, apraxia or anomia. Speech is clear with normal prosody and enunciation. Thought process is linear. Mood is congruent and affect is normal.  Cranial nerves are as described above under HEENT exam. In addition, shoulder shrug is normal with equal shoulder height noted. Motor exam: Normal bulk, strength and tone is noted. There is no drift, tremor or rebound. Romberg is negative. Reflexes are 1+ throughout. Fine motor skills are intact in the UEs and LEs.  Cerebellar testing shows no dysmetria or intention tremor on finger to nose testing.  There is no truncal or gait ataxia.  Sensory exam is intact to light touch in the upper and lower extremities.  Gait, station and balance are unremarkable. No veering to one side is noted. No leaning to one side is noted. Posture is age-appropriate and stance is narrow based. No problems turning are noted. Angelica Mcconnell turns en bloc.  Assessment and Plan:   In summary, BRYLI MANTEY is a very pleasant 69 year old female with an underlying medical history of obesity, hypertension, and reflux disease, who presents for followup consultation of her obstructive sleep apnea, on  CPAP at a pressure of 6 cm with great results and full compliance. Her exam is stable. Angelica Mcconnell indicates ongoing good results with the use of CPAP and good tolerance of the pressure and the mask. Angelica Mcconnell alternates a nasal pillows interface with a nasal mask. Angelica Mcconnell will need new supplies. I renewed her CPAP supply order. We will fax this to her DME company. I did ask her to work on her weight loss. Angelica Mcconnell is encouraged to continue to stay well-hydrated and active mentally and physically.  I would like to see her back in a year for routine sleep apnea checkup. Angelica Mcconnell is encouraged to call for any interim questions or concerns. I answered all her questions today and the patient was in agreement.  I spent 20 minutes in total face-to-face time with the patient, more than 50% of which was spent in counseling and coordination of care, reviewing test results, reviewing medication and discussing or reviewing the diagnosis of OSA, its prognosis and treatment options.

## 2015-06-01 NOTE — Patient Instructions (Signed)
Please continue using your CPAP regularly. While your insurance requires that you use CPAP at least 4 hours each night on 70% of the nights, I recommend, that you not skip any nights and use it throughout the night if you can. Getting used to CPAP and staying with the treatment long term does take time and patience and discipline. Untreated obstructive sleep apnea when it is moderate to severe can have an adverse impact on cardiovascular health and raise her risk for heart disease, arrhythmias, hypertension, congestive heart failure, stroke and diabetes. Untreated obstructive sleep apnea causes sleep disruption, nonrestorative sleep, and sleep deprivation. This can have an impact on your day to day functioning and cause daytime sleepiness and impairment of cognitive function, memory loss, mood disturbance, and problems focussing. Using CPAP regularly can improve these symptoms.  Keep up the good work! I will see you back in a year for sleep apnea check up.   

## 2015-06-21 DIAGNOSIS — H2512 Age-related nuclear cataract, left eye: Secondary | ICD-10-CM | POA: Diagnosis not present

## 2015-06-21 DIAGNOSIS — H40003 Preglaucoma, unspecified, bilateral: Secondary | ICD-10-CM | POA: Diagnosis not present

## 2016-05-31 ENCOUNTER — Encounter: Payer: Self-pay | Admitting: Neurology

## 2016-05-31 ENCOUNTER — Ambulatory Visit (INDEPENDENT_AMBULATORY_CARE_PROVIDER_SITE_OTHER): Payer: Medicare Other | Admitting: Neurology

## 2016-05-31 VITALS — BP 103/72 | HR 78 | Resp 18 | Ht 64.0 in | Wt 257.0 lb

## 2016-05-31 DIAGNOSIS — E669 Obesity, unspecified: Secondary | ICD-10-CM | POA: Diagnosis not present

## 2016-05-31 DIAGNOSIS — G4733 Obstructive sleep apnea (adult) (pediatric): Secondary | ICD-10-CM | POA: Diagnosis not present

## 2016-05-31 DIAGNOSIS — Z9989 Dependence on other enabling machines and devices: Secondary | ICD-10-CM | POA: Diagnosis not present

## 2016-05-31 NOTE — Patient Instructions (Signed)
Please continue using your CPAP regularly. While your insurance requires that you use CPAP at least 4 hours each night on 70% of the nights, I recommend, that you not skip any nights and use it throughout the night if you can. Getting used to CPAP and staying with the treatment long term does take time and patience and discipline. Untreated obstructive sleep apnea when it is moderate to severe can have an adverse impact on cardiovascular health and raise her risk for heart disease, arrhythmias, hypertension, congestive heart failure, stroke and diabetes. Untreated obstructive sleep apnea causes sleep disruption, nonrestorative sleep, and sleep deprivation. This can have an impact on your day to day functioning and cause daytime sleepiness and impairment of cognitive function, memory loss, mood disturbance, and problems focussing. Using CPAP regularly can improve these symptoms.  Keep up the good work! I will see you back in 12 months for sleep apnea check up.  Please try to lose weight.

## 2016-05-31 NOTE — Progress Notes (Signed)
Subjective:    Patient ID: Angelica Mcconnell is a 70 y.o. female.  HPI     Interim history:  Angelica Mcconnell is a very friendly 70 year old right-handed woman with an underlying medical history of obesity, hypertension, prior smoking, reflux disease, who presents for followup consultation of her obstructive sleep apnea, will established on home CPAP therapy at a pressure of 6 cm. The patient is unaccompanied today. I last saw her on 06/01/2015, at which time she was fully compliant with CPAP therapy. She was doing well and had no sleep related complaints. She was working on weight loss. I suggested a one-year checkup.  Today, 05/31/2016: I reviewed her CPAP compliance data from 04/29/2016 through 05/28/2016 which is a total of 30 days, during which time she used her CPAP every night with percent used days greater than 4 hours at 100%, indicating superb compliance with an average usage of 8 hours and 52 minutes, residual AHI of 0.8 per hour, leaked low with the 95th percentile at 6.9 L/m on a pressure of 6 cm with EPR of 3.  Today, 05/31/2016: She reports doing okay, no recent complaints, has had supplies on a regular basis, is fully compliant with CPAP, has had her yearly checkup with primary care physician. Wait for the most part stable, perhaps a couple pounds more, recent cataract surgery on the left in August 2017 was also successful but had borderline eye pressure, was placed on eyedrops for a while.  Previously:   I saw her on 05/28/14, at which time she was doing well, with no new symptoms. She was compliant with treatment.   I reviewed her CPAP compliance data from 04/30/2015 through 05/29/2015 which is a total of 30 days during which time she used her machine every night with percent used days greater than 4 hours at 100%, indicating superb compliance with an average usage of 9 hours and 27 minutes, residual AHI low at 0.8 per hour, leak low with the 95th percentile at 4.5 L/m on a pressure  of 6 cm.   I saw her on 11/25/2013, at which time she reported sleeping better with CPAP, waking up better rested and less nocturia. I congratulated her on her great compliance and encouraged her to continue using CPAP regularly.   I reviewed her compliance data from 04/26/2014 through 05/25/2014 which is a total of 30 days during which time she used her CPAP every day. Percent used days greater than 4 hours was 97%, indicating excellent compliance, residual AHI low at 1.5 per hour, leak low at 6.9 L per minute at the 95th percentile, average usage of 7 hours and 36 minutes, pressure at 6 cm with EPR of 3.  I first met her on 07/15/2013, at which time she reported loud snoring, nonrestorative sleep, morning headaches and daytime somnolence. She also reported bruxism, for which she was using an over-the-counter bite guard. I advised her to return for sleep study. She is at baseline sleep study followed by a CPAP titration study. I went over her test results in detail with her today. Her baseline sleep study from 08/05/2013 showed a sleep efficiency of 74.9% with a latency to sleep of 68.5 minutes and wake after sleep onset of 39 minutes with mild to moderate sleep fragmentation noted. She had increased percentages of stage I and stage II sleep, I decreased percentage of slow-wave sleep and a mildly increased percentage of REM sleep with a normal REM latency. She had mild snoring with rare loud snoring noted.  She had a total AHI of 5.4 per hour, rising to 17.4 per hour in REM sleep. Her baseline oxygen saturation was 92% with a nadir of 80%. Time below 88% saturation was 4 minutes and 4 seconds. She was requested to return for a CPAP titration study. She had this test on 09/11/2013. Sleep efficiency was 76.6% with a latency to sleep of 27 minutes and wake after sleep onset of 70 minutes with moderate sleep fragmentation noted. She had an increased percentage of stage II sleep, I decreased percentage of deep  sleep, and a near normal percentage of REM sleep with a mildly reduced REM latency. She had occasional PVCs and PACs on EKG. Of note, during the baseline sleep study she had similar EKG changes. Snoring was eliminated with CPAP. She was started on 5 cm and titrated to 7 cm. On 6 cm of pressure her AHI was 0 per hour. Pre-supine REM sleep was achieved. Based on the test results I prescribed CPAP for her.   I reviewed the patient's CPAP compliance data from 10/15/2013 to 11/13/2013, which is a total of 30 days, during which time the patient used CPAP every day. The average usage for all days was 9 hours and 1 minutes. The percent used days greater than 4 hours was 100%, indicating superb compliance. The residual AHI was 1.6 per hour, indicating an appropriate treatment pressure with very little leak documented.   I reviewed her compliance data from 10/14/2013 through 11/24/2013 which is the last 42 days during which times he use CPAP every night with percent used days greater than 4 hours of 100%, indicating superb compliance. Average usage was 9 hours and 3 minutes with a residual AHI of 1.5/h. Leak was very low. Pressure is 6 cm with EPR of 3.   I reviewed the patient's CPAP compliance data from 01/14/2014 to 02/12/2014, which is a total of 30 days, during which time the patient used CPAP every day. The average usage for all days was 7 hours and 20 minutes. The percent used days greater than 4 hours was 90 %, indicating excellent compliance. The residual AHI was 1.3 per hour, indicating an appropriate treatment pressure of 6 cwp with EPR of 3. Air leak from the mask was low at 6.9 L per minute at the 95th percentile.   Her Past Medical History Is Significant For: Past Medical History:  Diagnosis Date  . Gout   . HTN (hypertension)   . OSA (obstructive sleep apnea) 07/15/2013  . Reflux   . Sinus infection     Her Past Surgical History Is Significant For: Past Surgical History:  Procedure Laterality  Date  . COLONOSCOPY    . COLONOSCOPY [name] 08/31/2014   Procedure: COLONOSCOPY;  Surgeon: Daneil Dolin, MD;  Location: [facility];  Service: Endoscopy;  Laterality: [name];  8:30 AM    Her Family History Is Significant For: Family History  Problem Relation Age of Onset  . Heart failure Mother   . Diabetes Mother   . Thyroid disease Mother     Her Social History Is Significant For: Social History   Social History  . Marital status: Single    Spouse name: [name]  . Number of children: [name]  . Years of education: [name]   Social History Main Topics  . Smoking status: Former Smoker    Quit date: 07/15/1989  . Smokeless tobacco: None  . Alcohol use No  . Drug use: No  . Sexual activity: Not Asked  Other Topics Concern  . 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      Her Allergies Are:  No Known Allergies:   Her Current Medications Are:  Outpatient Encounter Prescriptions as of 05/31/2016  Medication Sig  . allopurinol (ZYLOPRIM) 100 MG tablet Take 1 tablet by mouth daily.  Marland Kitchen amLODipine (NORVASC) 2.5 MG tablet Take 2.5 mg by mouth daily.  . Calcium Carbonate-Vitamin D (CALTRATE 600+D PO) Take 1 tablet by mouth daily.   . fluticasone (FLONASE) 50 MCG/ACT nasal spray Place 1-2 sprays into both nostrils daily.  . irbesartan (AVAPRO) 300 MG tablet Take 1 tablet by mouth daily.  Marland Kitchen omeprazole (PRILOSEC) 20 MG capsule Take 20 mg by mouth daily as needed. Takes only as needed   No facility-administered encounter medications on file as of 05/31/2016.   :  Review of Systems:  Out of a complete 14 point review of systems, all are reviewed and negative with the exception of these symptoms as listed below: Review of Systems  Neurological:       Patient states that she is doing well with CPAP. No new concerns.     Objective:  Neurologic Exam  Physical Exam Physical Examination:   Vitals:   05/31/16 1110  BP:  103/72  Pulse: 78  Resp: 18   General Examination: The patient is a very pleasant 70 y.o. female in no acute distress. She appears well-developed and well-nourished and very well groomed.   HEENT: Normocephalic, atraumatic, pupils are equal, round and reactive to light and accommodation. S/p cataract repair b/l. Extraocular tracking is good without limitation to gaze excursion or nystagmus noted. Normal smooth pursuit is noted. Hearing is grossly intact. Face is symmetric with normal facial animation and normal facial sensation. Speech is clear with no dysarthria noted. There is no hypophonia. There is no lip, neck/head, jaw or voice tremor. Neck is supple with full range of passive and active motion. There are no carotid bruits on auscultation. Oropharynx exam reveals: mild mouth dryness, mild pharyngeal erythema, adequate dental hygiene and moderate airway crowding, due to large tongue, redundant soft palate and narrow airway entry. Mallampati is class III. Tongue protrudes centrally and palate elevates symmetrically. Tonsils are 1+.  Chest: normal breath sounds, no wheezing, rhonchi or crackles noted. She has some coughing bouts.   Heart: S1+S2+0, regular and normal without murmurs, rubs or gallops noted.   Abdomen: Soft, non-tender and non-distended with normal bowel sounds appreciated on auscultation.  Extremities: There is no pitting edema in the distal lower extremities bilaterally. Pedal pulses are intact.  Skin: Warm and dry without trophic changes noted. There are no varicose veins.  Musculoskeletal: exam reveals no obvious joint deformities, tenderness or joint swelling or erythema, with the exception of mild puffiness around right ankle.   Neurologically:  Mental status: The patient is awake, alert and oriented in all 4 spheres. Her memory, attention, language and knowledge are appropriate. There is no aphasia, agnosia, apraxia or anomia. Speech is clear with normal prosody and  enunciation. Thought process is linear. Mood is congruent and affect is normal.  Cranial nerves are as described above under HEENT exam. In addition, shoulder shrug is normal with equal shoulder height noted. Motor exam: Normal bulk, strength and tone is noted. There is no drift, tremor or rebound. Romberg is negative. Reflexes are 1+ throughout. Fine motor skills are intact in the UEs and LEs.  Cerebellar testing  shows no dysmetria or intention tremor on finger to nose testing. There is no truncal or gait ataxia.  Sensory exam is intact to light touch in the upper and lower extremities.  Gait, station and balance are unremarkable. No veering to one side is noted. No leaning to one side is noted. Posture is age-appropriate and stance is narrow based. No problems turning are noted.       Assessment and Plan:   In summary, Angelica Mcconnell is a very pleasant 70 year old female with an underlying medical history of obesity, hypertension, and reflux disease, who presents for followup consultation of her obstructive sleep apnea, on home CPAP at a pressure of 6 cm with great results and full compliance. Her exam is stable. She indicates ongoing good results with the use of CPAP and good tolerance of the pressure and the mask. She alternates a nasal pillows interface with a nasal mask. She has recently received new supplies but I will update her prescription for that. We will fax this to her DME company. I did ask her to work on her weight loss. She is encouraged to continue to stay well-hydrated and active mentally and physically. I  suggested a one-year checkup routinely for sleep apnea, she can see one of our nurse practitioners at the time, I can see her back after that. I answered all her questions today and the patient was in agreement.  I spent 20 minutes in total face-to-face time with the patient, more than 50% of which was spent in counseling and coordination of care, reviewing test results, reviewing  medication and discussing or reviewing the diagnosis of OSA, its prognosis and treatment options.

## 2016-06-12 ENCOUNTER — Encounter: Payer: Self-pay | Admitting: Neurology

## 2016-12-21 ENCOUNTER — Ambulatory Visit
Admit: 2016-12-21 | Discharge: 2016-12-21 | Payer: PRIVATE HEALTH INSURANCE | Attending: Nurse Practitioner | Primary: Internal Medicine

## 2016-12-21 DIAGNOSIS — Z7689 Persons encountering health services in other specified circumstances: Secondary | ICD-10-CM

## 2016-12-21 LAB — AMB POC URINALYSIS DIP STICK AUTO W/O MICRO
Bilirubin (UA POC): NEGATIVE
Blood (UA POC): NEGATIVE
Glucose (UA POC): NEGATIVE
Ketones (UA POC): NEGATIVE
Leukocyte esterase (UA POC): NEGATIVE
Nitrites (UA POC): NEGATIVE
Protein (UA POC): NEGATIVE
Specific gravity (UA POC): 1.015 (ref 1.001–1.035)
Urobilinogen (UA POC): 0.2 (ref 0.2–1)
pH (UA POC): 6.5 (ref 4.6–8.0)

## 2016-12-21 LAB — AMB POC HEMOGLOBIN A1C: Hemoglobin A1c (POC): 5.8 %

## 2016-12-21 NOTE — Progress Notes (Signed)
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 This will lower your risk for many problems, such as obesity, diabetes, heart disease, and high blood pressure.  ?? Get at least 30 minutes of exercise on most days of the week. Walking is a good choice. You also may want to do other activities, such as running, swimming, cycling, or playing tennis or team sports.  ?? Do not smoke. Smoking can make health problems worse. If you need help quitting, talk to your doctor about stop-smoking programs and medicines. These can increase your chances of quitting for good.  ?? Protect your skin from too much sun. When you're outdoors from 10 a.m. to 4 p.m., stay in the shade or cover up with clothing and a hat with a wide brim. Wear sunglasses that block UV rays. Even when it's cloudy, put broad-spectrum sunscreen (SPF 30 or higher) on any exposed skin.  ?? See a dentist one or two times a year for checkups and to have your teeth cleaned.  ?? Wear a seat belt in the car.  ?? Limit alcohol to 2 drinks a day for men and 1 drink a day for women. Too much alcohol can cause health problems.   Follow your doctor's advice about when to have certain tests. These tests can spot problems early.  For men and women  ?? Cholesterol. Your doctor will tell you how often to have this done based on your overall health and other things that can increase your risk  for heart attack and stroke.  ?? Blood pressure. Have your blood pressure checked during a routine doctor visit. Your doctor will tell you how often to check your blood pressure based on your age, your blood pressure results, and other factors.  ?? Diabetes. Ask your doctor whether you should have tests for diabetes.  ?? Vision. Experts recommend that you have yearly exams for glaucoma and other age-related eye problems.  ?? Hearing. Tell your doctor if you notice any change in your hearing. You can have tests to find out how well you hear.  ?? Colon cancer tests. Keep having colon cancer tests as your doctor recommends. You can have one of several types of tests.  ?? Heart attack and stroke risk. At least every 4 to 6 years, you should have your risk for heart attack and stroke assessed. Your doctor uses factors such as your age, blood pressure, cholesterol, and whether you smoke or have diabetes to show what your risk for a heart attack or stroke is over the next 10 years.  ?? Osteoporosis. Talk to your doctor about whether you should have a bone density test to find out whether you have thinning bones. Also ask your doctor about whether you should take calcium and vitamin D supplements.  For women  ?? Pap test and pelvic exam. You may no longer need a Pap test. Talk with your doctor about whether to stop or continue to have Pap tests.  ?? Breast exam and mammogram. Ask how often you should have a mammogram, which is an X-ray of your breasts. A mammogram can spot breast cancer before it can be felt and when it is easiest to treat.  ?? Thyroid disease. Talk to your doctor about whether to have your thyroid  checked as part of a regular physical exam. Women have an increased chance of a thyroid problem.  For men  ?? Prostate exam. Talk to your doctor about whether you should have a blood test (called a PSA test) for prostate cancer. Experts disagree on whether men should have this test. Some experts recommend that you discuss the benefits and risks of the test with your doctor.  ?? Abdominal aortic aneurysm. Ask your doctor whether you should have a test to check for an aneurysm. You may need a test if you ever smoked or if your parent, brother, sister, or child has had an aneurysm.  When should you call for help?  Watch closely for changes in your health, and be sure to contact your doctor if you have any problems or symptoms that concern you.  Where can you learn more?  Go to [email or web address].  Enter [phone] in the search box to learn more about "Well Visit, Over 65: Care Instructions."  Current as of: Dec 24, 2015  Content Version: 11.4  ?? 2006-2017 [employer]. Care instructions adapted under license by [employer] (which disclaims liability or warranty for this information). If you have questions about a medical condition or this instruction, always ask your healthcare professional. [place] any warranty or liability for your use of this information.       Seizure: Care Instructions  Your Care Instructions    Seizures are caused by abnormal patterns of electrical signals in the brain. They are different for each person.  Seizures can affect movement, speech, vision, or awareness. Some people have only slight shaking of a hand and do not pass out. Other people may pass out and have violent shaking of  the whole body. Some people appear to stare into space. They are awake, but they can't respond normally. Later, they may not remember what happened.  You may need tests to identify the type and cause of the seizures.   A seizure may occur only once, or you may have them more than one time. Taking medicines as directed and following up with your doctor may help keep you from having more seizures.  The doctor has checked you carefully, but problems can develop later. If you notice any problems or new symptoms, get medical treatment right away.  Follow-up care is a key part of your treatment and safety. Be sure to make and go to all appointments, and call your doctor if you are having problems. It's also a good idea to know your test results and keep a list of the medicines you take.  How can you care for yourself at home?  ?? Be safe with medicines. Take your medicines exactly as prescribed. Call your doctor if you think you are having a problem with your medicine.  ?? Do not do any activity that could be dangerous to you or others until your doctor says it is safe to do so. For example, do not drive a car, operate machinery, swim, or climb ladders.  ?? Be sure that anyone treating you for any health problem knows that you have had a seizure and what medicines you are taking for it.  ?? Identify and avoid things that may make you more likely to have a seizure. These may include lack of sleep, alcohol or drug use, stress, or not eating.  ?? Make sure you go to your follow-up appointment.  When should you call for help?  Call 911 anytime you think you may need emergency care. For example, call if:  ? ?? You have another seizure.   ? ?? You have more than one seizure in 24 hours.   ? ?? You have new symptoms, such as trouble walking, speaking, or thinking clearly.   ?Call your doctor now or seek immediate medical care if:  ? ?? You are not acting normally.   ?Watch closely for changes in your health, and be sure to contact your doctor if you have any problems.  Where can you learn more?  Go to [email or web address].  Enter [phone] in the search box to learn more about "Seizure: Care Instructions."   Current as of: May 28, 2015  Content Version: 11.4  ?? 2006-2017 [employer]. Care instructions adapted under license by [employer] (which disclaims liability or warranty for this information). If you have questions about a medical condition or this instruction, always ask your healthcare professional. [place] any warranty or liability for your use of this information.       Anxiety Disorder: Care Instructions  Your Care Instructions    Anxiety is a normal reaction to stress. Difficult situations can cause you to have symptoms such as sweaty palms and a nervous feeling.  In an anxiety disorder, the symptoms are far more severe. Constant worry, muscle tension, trouble sleeping, nausea and diarrhea, and other symptoms can make normal daily activities difficult or impossible. These symptoms may occur for no reason, and they can affect your work, school, or social life. Medicines, counseling, and self-care can all help.  Follow-up care is a key part of your treatment and safety. Be sure to make and go to all appointments, and call your doctor if you are having problems. It's  also a good idea to know your test results and keep a list of the medicines you take.  How can you care for yourself at home?  ?? Take medicines exactly as directed. Call your doctor if you think you are having a problem with your medicine.  ?? Go to your counseling sessions and follow-up appointments.  ?? Recognize and accept your anxiety. Then, when you are in a situation that makes you anxious, say to yourself, "This is not an emergency. I feel uncomfortable, but I am not in danger. I can keep going even if I feel anxious."  ?? Be kind to your body:  ?? Relieve tension with exercise or a massage.  ?? Get enough rest.  ?? Avoid alcohol, caffeine, nicotine, and illegal drugs. They can increase your anxiety level and cause sleep problems.   ?? Learn and do relaxation techniques. See below for more about these techniques.  ?? Engage your mind. Get out and do something you enjoy. Go to a funny movie, or take a walk or hike. Plan your day. Having too much or too little to do can make you anxious.  ?? Keep a record of your symptoms. Discuss your fears with a good friend or family member, or join a support group for people with similar problems. Talking to others sometimes relieves stress.  ?? Get involved in social groups, or volunteer to help others. Being alone sometimes makes things seem worse than they are.  ?? Get at least 30 minutes of exercise on most days of the week to relieve stress. Walking is a good choice. You also may want to do other activities, such as running, swimming, cycling, or playing tennis or team sports.  Relaxation techniques  Do relaxation exercises 10 to 20 minutes a day. You can play soothing, relaxing music while you do them, if you wish.  ?? Tell others in your house that you are going to do your relaxation exercises. Ask them not to disturb you.  ?? Find a comfortable place, away from all distractions and noise.  ?? Lie down on your back, or sit with your back straight.  ?? Focus on your breathing. Make it slow and steady.  ?? Breathe in through your nose. Breathe out through either your nose or mouth.  ?? Breathe deeply, filling up the area between your navel and your rib cage. Breathe so that your belly goes up and down.  ?? Do not hold your breath.  ?? Breathe like this for 5 to 10 minutes. Notice the feeling of calmness throughout your whole body.  As you continue to breathe slowly and deeply, relax by doing the following for another 5 to 10 minutes:  ?? Tighten and relax each muscle group in your body. You can begin at your toes and work your way up to your head.  ?? Imagine your muscle groups relaxing and becoming heavy.  ?? Empty your mind of all thoughts.  ?? Let yourself relax more and more deeply.   ?? Become aware of the state of calmness that surrounds you.  ?? When your relaxation time is over, you can bring yourself back to alertness by moving your fingers and toes and then your hands and feet and then stretching and moving your entire body. Sometimes people fall asleep during relaxation, but they usually wake up shortly afterward.  ?? Always give yourself time to return to full alertness before you drive a car or do anything that might cause an accident if  you are not fully alert. Never play a relaxation tape while you drive a car.  When should you call for help?  Call 911 anytime you think you may need emergency care. For example, call if:  ? ?? You feel you cannot stop from hurting yourself or someone else.   ?Keep the numbers for these national suicide hotlines: 1-800-273-TALK 219 655 6107) and 1-800-SUICIDE 310-180-8365). If you or someone you know talks about suicide or feeling hopeless, get help right away.  ?Watch closely for changes in your health, and be sure to contact your doctor if:  ? ?? You have anxiety or fear that affects your life.   ? ?? You have symptoms of anxiety that are new or different from those you had before.   Where can you learn more?  Go to [email or web address].  Enter P754 in the search box to learn more about "Anxiety Disorder: Care Instructions."  Current as of: Dec 24, 2015  Content Version: 11.4  ?? 2006-2017 [employer]. Care instructions adapted under license by [employer] (which disclaims liability or warranty for this information). If you have questions about a medical condition or this instruction, always ask your healthcare professional. [place] any warranty or liability for your use of this information.       Follow-up Disposition:  Return in about 2 weeks (around 01/04/2017), or if symptoms worsen or fail to improve, for to discuss lab results.     Advised to return to office, call back, or go to ER if symptoms worsen or persists.     I have reviewed with the patient details of the assessment and plan and all questions were answered. Relevent patient education was performed    An After Visit Summary was printed and given to the patient, and wasgiven the opportunity to ask questions.    Amaal Dimartino A. Hector [place], DNP

## 2016-12-21 NOTE — Progress Notes (Signed)
Chief Complaint   Patient presents with   ??? Establish Care     1. Have you been to the ER, urgent care clinic since your last visit?  Hospitalized since your last visit?No    2. Have you seen or consulted any other health care providers outside of the [facility] since your last visit?  Include any pap smears or colon screening. No

## 2016-12-21 NOTE — Patient Instructions (Addendum)
Type 2 Diabetes: Care Instructions  Your Care Instructions    Type 2 diabetes is a disease that develops when the body's tissues cannot use insulin properly. Over time, the pancreas cannot make enough insulin. Insulin is a hormone that helps the body's cells use sugar (glucose) for energy. It also helps the body store extra sugar in muscle, fat, and liver cells.  Without insulin, the sugar cannot get into the cells to do its work. It stays in the blood instead. This can cause high blood sugar levels. A person has diabetes when the blood sugar stays too high too much of the time. Over time, diabetes can lead to diseases of the heart, blood vessels, nerves, kidneys, and eyes.  You may be able to control your blood sugar by losing weight, eating a healthy diet, and getting daily exercise. You may also have to take insulin or other diabetes medicine.  Follow-up care is a key part of your treatment and safety. Be sure to make and go to all appointments. Call your doctor if you are having problems. It's also a good idea to know your test results and keep a list of the medicines you take.  How can you care for yourself at home?  ?? Keep your blood sugar at a target level (which you set with your doctor).  ?? Eat a good diet that spreads carbohydrate throughout the day. Carbohydrate-the body's main source of fuel-affects blood sugar more than any other nutrient. Carbohydrate is in fruits, vegetables, milk, and yogurt. It also is in breads, cereals, vegetables such as potatoes and corn, and sugary foods such as candy and cakes.  ?? Aim for 30 minutes of exercise on most, preferably all, days of the week. Walking is a good choice. You also may want to do other activities, such as running, swimming, cycling, or playing tennis or team sports. If your doctor says it's okay, do muscle-strengthening exercises at least 2 times a week.  ?? Take your medicines exactly as prescribed. Call your doctor if you think  you are having a problem with your medicine. You will get more details on the specific medicines your doctor prescribes.  ?? Check your blood sugar as often as your doctor recommends. It is important to keep track of any symptoms you have, such as low blood sugar. Also tell your doctor if you have any changes in your activities, diet, or insulin use.  ?? Talk to your doctor before you start taking aspirin every day. Aspirin can help certain people lower their risk of a heart attack or stroke. But taking aspirin isn't right for everyone, because it can cause serious bleeding.  ?? Do not smoke. If you need help quitting, talk to your doctor about stop-smoking programs and medicines. These can increase your chances of quitting for good.  ?? Keep your cholesterol and blood pressure at normal levels. You may need to take one or more medicines to reach your goals. Take them exactly as directed. Do not stop or change a medicine without talking to your doctor first.  When should you call for help?  Call 911 anytime you think you may need emergency care. For example, call if:  ? ?? You passed out (lost consciousness), or you suddenly become very sleepy or confused. (You may have very low blood sugar.)   ?Call your doctor now or seek immediate medical care if:  ? ?? Your blood sugar is 300 mg/dL or is higher than the level your doctor has   set for you.   ? ?? You have symptoms of low blood sugar, such as:  ?? Sweating.  ?? Feeling nervous, shaky, and weak.  ?? Extreme hunger and slight nausea.  ?? Dizziness and headache.  ?? Blurred vision.  ?? Confusion.   ?Watch closely for changes in your health, and be sure to contact your doctor if:  ? ?? You often have problems controlling your blood sugar.   ? ?? You have symptoms of long-term diabetes problems, such as:  ?? New vision changes.  ?? New pain, numbness, or tingling in your hands or feet.  ?? Skin problems.   Where can you learn more?  Go to [email or web address].   Enter C553 in the search box to learn more about "Type 2 Diabetes: Care Instructions."  Current as of: October 25, 2015  Content Version: 11.4  ?? 2006-2017 [employer]. Care instructions adapted under license by [employer] (which disclaims liability or warranty for this information). If you have questions about a medical condition or this instruction, always ask your healthcare professional. [place] any warranty or liability for your use of this information.       High Cholesterol: Care Instructions  Your Care Instructions    Cholesterol is a type of fat in your blood. It is needed for many body functions, such as making new cells. Cholesterol is made by your body. It also comes from food you eat. High cholesterol means that you have too much of the fat in your blood. This raises your risk of a heart attack and stroke.  LDL and HDL are part of your total cholesterol. LDL is the "bad" cholesterol. High LDL can raise your risk for heart disease, heart attack, and stroke. HDL is the "good" cholesterol. It helps clear bad cholesterol from the body. High HDL is linked with a lower risk of heart disease, heart attack, and stroke.  Your cholesterol levels help your doctor find out your risk for having a heart attack or stroke. You and your doctor can talk about whether you need to lower your risk and what treatment is best for you.  A heart-healthy lifestyle along with medicines can help lower your cholesterol and your risk. The way you choose to lower your risk will depend on how high your risk is for heart attack and stroke. It will also depend on how you feel about taking medicines.  Follow-up care is a key part of your treatment and safety. Be sure to make and go to all appointments, and call your doctor if you are having problems. It's also a good idea to know your test results and keep a list of the medicines you take.  How can you care for yourself at home?   ?? Eat a variety of foods every day. Good choices include fruits, vegetables, whole grains (like oatmeal), dried beans and peas, nuts and seeds, soy products (like tofu), and fat-free or low-fat dairy products.  ?? Replace butter, margarine, and hydrogenated or partially hydrogenated oils with olive and canola oils. (Canola oil margarine without trans fat is fine.)  ?? Replace red meat with fish, poultry, and soy protein (like tofu).  ?? Limit processed and packaged foods like chips, crackers, and cookies.  ?? Bake, broil, or steam foods. Don't fry them.  ?? Be physically active. Get at least 30 minutes of exercise on most days of the week. Walking is a good choice. You also may want to do other activities, such as  running, swimming, cycling, or playing tennis or team sports.  ?? Stay at a healthy weight or lose weight by making the changes in eating and physical activity listed above. Losing just a small amount of weight, even 5 to 10 pounds, can reduce your risk for having a heart attack or stroke.  ?? Do not smoke.  When should you call for help?  Watch closely for changes in your health, and be sure to contact your doctor if:  ? ?? You need help making lifestyle changes.   ? ?? You have questions about your medicine.   Where can you learn more?  Go to [email or web address].  Enter [phone] in the search box to learn more about "High Cholesterol: Care Instructions."  Current as of: May 05, 2015  Content Version: 11.4  ?? 2006-2017 [employer]. Care instructions adapted under license by [employer] (which disclaims liability or warranty for this information). If you have questions about a medical condition or this instruction, always ask your healthcare professional. [employer], Inc[employer] disclaims any warranty or liability for your use of this information.       Well Visit, Over 65: Care Instructions  Your Care Instructions     Physical exams can help you stay healthy. Your doctor has checked your overall health and may have suggested ways to take good care of yourself. He or she also may have recommended tests. At home, you can help prevent illness with healthy eating, regular exercise, and other steps.  Follow-up care is a key part of your treatment and safety. Be sure to make and go to all appointments, and call your doctor if you are having problems. It's also a good idea to know your test results and keep a list of the medicines you take.  How can you care for yourself at home?  ?? Reach and stay at a healthy weight. This will lower your risk for many problems, such as obesity, diabetes, heart disease, and high blood pressure.  ?? Get at least 30 minutes of exercise on most days of the week. Walking is a good choice. You also may want to do other activities, such as running, swimming, cycling, or playing tennis or team sports.  ?? Do not smoke. Smoking can make health problems worse. If you need help quitting, talk to your doctor about stop-smoking programs and medicines. These can increase your chances of quitting for good.  ?? Protect your skin from too much sun. When you're outdoors from 10 a.m. to 4 p.m., stay in the shade or cover up with clothing and a hat with a wide brim. Wear sunglasses that block UV rays. Even when it's cloudy, put broad-spectrum sunscreen (SPF 30 or higher) on any exposed skin.  ?? See a dentist one or two times a year for checkups and to have your teeth cleaned.  ?? Wear a seat belt in the car.  ?? Limit alcohol to 2 drinks a day for men and 1 drink a day for women. Too much alcohol can cause health problems.  Follow your doctor's advice about when to have certain tests. These tests can spot problems early.  For men and women  ?? Cholesterol. Your doctor will tell you how often to have this done based on your overall health and other things that can increase your risk for heart attack and stroke.   ?? Blood pressure. Have your blood pressure checked during a routine doctor visit. Your doctor will tell you how often to check  your blood pressure based on your age, your blood pressure results, and other factors.  ?? Diabetes. Ask your doctor whether you should have tests for diabetes.  ?? Vision. Experts recommend that you have yearly exams for glaucoma and other age-related eye problems.  ?? Hearing. Tell your doctor if you notice any change in your hearing. You can have tests to find out how well you hear.  ?? Colon cancer tests. Keep having colon cancer tests as your doctor recommends. You can have one of several types of tests.  ?? Heart attack and stroke risk. At least every 4 to 6 years, you should have your risk for heart attack and stroke assessed. Your doctor uses factors such as your age, blood pressure, cholesterol, and whether you smoke or have diabetes to show what your risk for a heart attack or stroke is over the next 10 years.  ?? Osteoporosis. Talk to your doctor about whether you should have a bone density test to find out whether you have thinning bones. Also ask your doctor about whether you should take calcium and vitamin D supplements.  For women  ?? Pap test and pelvic exam. You may no longer need a Pap test. Talk with your doctor about whether to stop or continue to have Pap tests.  ?? Breast exam and mammogram. Ask how often you should have a mammogram, which is an X-ray of your breasts. A mammogram can spot breast cancer before it can be felt and when it is easiest to treat.  ?? Thyroid disease. Talk to your doctor about whether to have your thyroid checked as part of a regular physical exam. Women have an increased chance of a thyroid problem.  For men  ?? Prostate exam. Talk to your doctor about whether you should have a blood test (called a PSA test) for prostate cancer. Experts disagree on whether men should have this test. Some experts recommend that you discuss the  benefits and risks of the test with your doctor.  ?? Abdominal aortic aneurysm. Ask your doctor whether you should have a test to check for an aneurysm. You may need a test if you ever smoked or if your parent, brother, sister, or child has had an aneurysm.  When should you call for help?  Watch closely for changes in your health, and be sure to contact your doctor if you have any problems or symptoms that concern you.  Where can you learn more?  Go to [employer].  Enter [phone] in the search box to learn more about "Well Visit, Over 65: Care Instructions."  Current as of: Dec 24, 2015  Content Version: 11.4  ?? 2006-2017 [employer]. Care instructions adapted under license by [employer] (which disclaims liability or warranty for this information). If you have questions about a medical condition or this instruction, always ask your healthcare professional. [employer], [employer] disclaims any warranty or liability for your use of this information.       Seizure: Care Instructions  Your Care Instructions    Seizures are caused by abnormal patterns of electrical signals in the brain. They are different for each person.  Seizures can affect movement, speech, vision, or awareness. Some people have only slight shaking of a hand and do not pass out. Other people may pass out and have violent shaking of the whole body. Some people appear to stare into space. They are awake, but they can't respond normally. Later, they may not remember what happened.  You may  need tests to identify the type and cause of the seizures.  A seizure may occur only once, or you may have them more than one time. Taking medicines as directed and following up with your doctor may help keep you from having more seizures.  The doctor has checked you carefully, but problems can develop later. If you notice any problems or new symptoms, get medical treatment right away.   Follow-up care is a key part of your treatment and safety. Be sure to make and go to all appointments, and call your doctor if you are having problems. It's also a good idea to know your test results and keep a list of the medicines you take.  How can you care for yourself at home?  ?? Be safe with medicines. Take your medicines exactly as prescribed. Call your doctor if you think you are having a problem with your medicine.  ?? Do not do any activity that could be dangerous to you or others until your doctor says it is safe to do so. For example, do not drive a car, operate machinery, swim, or climb ladders.  ?? Be sure that anyone treating you for any health problem knows that you have had a seizure and what medicines you are taking for it.  ?? Identify and avoid things that may make you more likely to have a seizure. These may include lack of sleep, alcohol or drug use, stress, or not eating.  ?? Make sure you go to your follow-up appointment.  When should you call for help?  Call 911 anytime you think you may need emergency care. For example, call if:  ? ?? You have another seizure.   ? ?? You have more than one seizure in 24 hours.   ? ?? You have new symptoms, such as trouble walking, speaking, or thinking clearly.   ?Call your doctor now or seek immediate medical care if:  ? ?? You are not acting normally.   ?Watch closely for changes in your health, and be sure to contact your doctor if you have any problems.  Where can you learn more?  Go to [employer].  Enter [phone] in the search box to learn more about "Seizure: Care Instructions."  Current as of: May 28, 2015  Content Version: 11.4  ?? 2006-2017 [employer]. Care instructions adapted under license by [employer] (which disclaims liability or warranty for this information). If you have questions about a medical condition or this instruction, always ask your healthcare professional. [employer]wise,  Incorporated disclaims any warranty or liability for your use of this information.       Anxiety Disorder: Care Instructions  Your Care Instructions    Anxiety is a normal reaction to stress. Difficult situations can cause you to have symptoms such as sweaty palms and a nervous feeling.  In an anxiety disorder, the symptoms are far more severe. Constant worry, muscle tension, trouble sleeping, nausea and diarrhea, and other symptoms can make normal daily activities difficult or impossible. These symptoms may occur for no reason, and they can affect your work, school, or social life. Medicines, counseling, and self-care can all help.  Follow-up care is a key part of your treatment and safety. Be sure to make and go to all appointments, and call your doctor if you are having problems. It's also a good idea to know your test results and keep a list of the medicines you take.  How can you care for yourself at home?  ??  Take medicines exactly as directed. Call your doctor if you think you are having a problem with your medicine.  ?? Go to your counseling sessions and follow-up appointments.  ?? Recognize and accept your anxiety. Then, when you are in a situation that makes you anxious, say to yourself, "This is not an emergency. I feel uncomfortable, but I am not in danger. I can keep going even if I feel anxious."  ?? Be kind to your body:  ?? Relieve tension with exercise or a massage.  ?? Get enough rest.  ?? Avoid alcohol, caffeine, nicotine, and illegal drugs. They can increase your anxiety level and cause sleep problems.  ?? Learn and do relaxation techniques. See below for more about these techniques.  ?? Engage your mind. Get out and do something you enjoy. Go to a funny movie, or take a walk or hike. Plan your day. Having too much or too little to do can make you anxious.  ?? Keep a record of your symptoms. Discuss your fears with a good friend or family member, or join a support group for people with similar problems.  Talking to others sometimes relieves stress.  ?? Get involved in social groups, or volunteer to help others. Being alone sometimes makes things seem worse than they are.  ?? Get at least 30 minutes of exercise on most days of the week to relieve stress. Walking is a good choice. You also may want to do other activities, such as running, swimming, cycling, or playing tennis or team sports.  Relaxation techniques  Do relaxation exercises 10 to 20 minutes a day. You can play soothing, relaxing music while you do them, if you wish.  ?? Tell others in your house that you are going to do your relaxation exercises. Ask them not to disturb you.  ?? Find a comfortable place, away from all distractions and noise.  ?? Lie down on your back, or sit with your back straight.  ?? Focus on your breathing. Make it slow and steady.  ?? Breathe in through your nose. Breathe out through either your nose or mouth.  ?? Breathe deeply, filling up the area between your navel and your rib cage. Breathe so that your belly goes up and down.  ?? Do not hold your breath.  ?? Breathe like this for 5 to 10 minutes. Notice the feeling of calmness throughout your whole body.  As you continue to breathe slowly and deeply, relax by doing the following for another 5 to 10 minutes:  ?? Tighten and relax each muscle group in your body. You can begin at your toes and work your way up to your head.  ?? Imagine your muscle groups relaxing and becoming heavy.  ?? Empty your mind of all thoughts.  ?? Let yourself relax more and more deeply.  ?? Become aware of the state of calmness that surrounds you.  ?? When your relaxation time is over, you can bring yourself back to alertness by moving your fingers and toes and then your hands and feet and then stretching and moving your entire body. Sometimes people fall asleep during relaxation, but they usually wake up shortly afterward.  ?? Always give yourself time to return to full alertness before you drive a  car or do anything that might cause an accident if you are not fully alert. Never play a relaxation tape while you drive a car.  When should you call for help?  Call 911 anytime you think you  may need emergency care. For example, call if:  ? ?? You feel you cannot stop from hurting yourself or someone else.   ?Keep the numbers for these national suicide hotlines: 1-800-273-TALK 772-580-9986) and 1-800-SUICIDE 256-680-4626). If you or someone you know talks about suicide or feeling hopeless, get help right away.  ?Watch closely for changes in your health, and be sure to contact your doctor if:  ? ?? You have anxiety or fear that affects your life.   ? ?? You have symptoms of anxiety that are new or different from those you had before.   Where can you learn more?  Go to [employer].  Enter P754 in the search box to learn more about "Anxiety Disorder: Care Instructions."  Current as of: Dec 24, 2015  Content Version: 11.4  ?? 2006-2017 [employer]. Care instructions adapted under license by [employer] (which disclaims liability or warranty for this information). If you have questions about a medical condition or this instruction, always ask your healthcare professional. [employer], [employer] disclaims any warranty or liability for your use of this information.

## 2016-12-22 LAB — CBC WITH AUTOMATED DIFF
ABS. BASOPHILS: 0 10*3/uL (ref 0.0–0.2)
ABS. EOSINOPHILS: 0.2 10*3/uL (ref 0.0–0.4)
ABS. IMM. GRANS.: 0 10*3/uL (ref 0.0–0.1)
ABS. MONOCYTES: 0.5 10*3/uL (ref 0.1–0.9)
ABS. NEUTROPHILS: 2 10*3/uL (ref 1.4–7.0)
Abs Lymphocytes: 1.7 10*3/uL (ref 0.7–3.1)
BASOPHILS: 1 %
EOSINOPHILS: 5 %
HCT: 39.3 % (ref 34.0–46.6)
HGB: 12.5 g/dL (ref 11.1–15.9)
IMMATURE GRANULOCYTES: 0 %
Lymphocytes: 37 %
MCH: 29.7 pg (ref 26.6–33.0)
MCHC: 31.8 g/dL (ref 31.5–35.7)
MCV: 93 fL (ref 79–97)
MONOCYTES: 12 %
NEUTROPHILS: 45 %
PLATELET: 410 10*3/uL — ABNORMAL HIGH (ref 150–379)
RBC: 4.21 x10E6/uL (ref 3.77–5.28)
RDW: 14.8 % (ref 12.3–15.4)
WBC: 4.5 10*3/uL (ref 3.4–10.8)

## 2016-12-22 LAB — METABOLIC PANEL, COMPREHENSIVE
A-G Ratio: 1.7 (ref 1.2–2.2)
ALT (SGPT): 13 IU/L (ref 0–32)
AST (SGOT): 18 IU/L (ref 0–40)
Albumin: 4.2 g/dL (ref 3.6–4.8)
Alk. phosphatase: 49 IU/L (ref 39–117)
BUN/Creatinine ratio: 10 — ABNORMAL LOW (ref 12–28)
BUN: 8 mg/dL (ref 8–27)
Bilirubin, total: 0.3 mg/dL (ref 0.0–1.2)
CO2: 23 mmol/L (ref 18–29)
Calcium: 9.7 mg/dL (ref 8.7–10.3)
Chloride: 111 mmol/L — ABNORMAL HIGH (ref 96–106)
Creatinine: 0.84 mg/dL (ref 0.57–1.00)
GFR est AA: 83 mL/min/{1.73_m2} (ref >59)
GFR est non-AA: 72 mL/min/{1.73_m2} (ref >59)
GLOBULIN, TOTAL: 2.5 g/dL (ref 1.5–4.5)
Glucose: 81 mg/dL (ref 65–99)
Potassium: 4.5 mmol/L (ref 3.5–5.2)
Protein, total: 6.7 g/dL (ref 6.0–8.5)
Sodium: 146 mmol/L — ABNORMAL HIGH (ref 134–144)

## 2016-12-22 LAB — LIPID PANEL
Cholesterol, total: 148 mg/dL (ref 100–199)
HDL Cholesterol: 57 mg/dL (ref >39)
LDL, calculated: 69 mg/dL (ref 0–99)
Triglyceride: 110 mg/dL (ref 0–149)
VLDL, calculated: 22 mg/dL (ref 5–40)

## 2016-12-22 LAB — HEMOGLOBIN A1C WITH EAG
Estimated average glucose: 128 mg/dL
Hemoglobin A1c: 6.1 % — ABNORMAL HIGH (ref 4.8–5.6)

## 2016-12-22 LAB — CVD REPORT

## 2016-12-22 LAB — DIABETES PATIENT EDUCATION

## 2016-12-22 LAB — HEPATITIS C AB: Hep C Virus Ab: <0.1 s/co ratio (ref 0.0–0.9)

## 2016-12-22 LAB — HEPATITIS C ANTIBODY: HCV Ab: <0.1 s/co ratio (ref 0.0–0.9)

## 2016-12-24 ENCOUNTER — Inpatient Hospital Stay
Admit: 2016-12-24 | Discharge: 2016-12-24 | Disposition: A | Discharge status: Home / Self Care | Payer: MEDICARE | Attending: Emergency Medicine

## 2016-12-24 ENCOUNTER — Emergency Department: Admit: 2016-12-24 | Payer: MEDICARE | Primary: Internal Medicine

## 2016-12-24 DIAGNOSIS — R1084 Generalized abdominal pain: Secondary | ICD-10-CM

## 2016-12-24 LAB — URINALYSIS W/ REFLEX CULTURE
Bacteria: NEGATIVE /hpf
Blood: NEGATIVE
Glucose: NEGATIVE mg/dL
Leukocyte Esterase: NEGATIVE
Nitrites: NEGATIVE
Specific gravity: 1.026 (ref 1.003–1.030)
Urobilinogen: 0.2 EU/dL (ref 0.2–1.0)
pH (UA): 5.5 (ref 5.0–8.0)

## 2016-12-24 LAB — CBC WITH AUTOMATED DIFF
ABS. BASOPHILS: 0 10*3/uL (ref 0.0–0.1)
ABS. EOSINOPHILS: 0.1 10*3/uL (ref 0.0–0.4)
ABS. IMM. GRANS.: 0 10*3/uL (ref 0.00–0.04)
ABS. LYMPHOCYTES: 2.4 10*3/uL (ref 0.8–3.5)
ABS. MONOCYTES: 0.8 10*3/uL (ref 0.0–1.0)
ABS. NEUTROPHILS: 4.2 10*3/uL (ref 1.8–8.0)
ABSOLUTE NRBC: 0 10*3/uL (ref 0.00–0.01)
BASOPHILS: 1 % (ref 0–1)
EOSINOPHILS: 1 % (ref 0–7)
HCT: 38.8 % (ref 35.0–47.0)
HGB: 12.5 g/dL (ref 11.5–16.0)
IMMATURE GRANULOCYTES: 0 % (ref 0.0–0.5)
LYMPHOCYTES: 32 % (ref 12–49)
MCH: 29.5 PG (ref 26.0–34.0)
MCHC: 32.2 g/dL (ref 30.0–36.5)
MCV: 91.5 FL (ref 80.0–99.0)
MONOCYTES: 11 % (ref 5–13)
MPV: 9.6 FL (ref 8.9–12.9)
NEUTROPHILS: 56 % (ref 32–75)
NRBC: 0 PER 100 WBC
PLATELET: 371 10*3/uL (ref 150–400)
RBC: 4.24 M/uL (ref 3.80–5.20)
RDW: 14.3 % (ref 11.5–14.5)
WBC: 7.6 10*3/uL (ref 3.6–11.0)

## 2016-12-24 LAB — METABOLIC PANEL, COMPREHENSIVE
A-G Ratio: 1.1 (ref 1.1–2.2)
ALT (SGPT): 27 U/L (ref 12–78)
AST (SGOT): 38 U/L — ABNORMAL HIGH (ref 15–37)
Albumin: 4.1 g/dL (ref 3.5–5.0)
Alk. phosphatase: 51 U/L (ref 45–117)
Anion gap: 9 mmol/L (ref 5–15)
BUN/Creatinine ratio: 13 (ref 12–20)
BUN: 13 MG/DL (ref 6–20)
Bilirubin, total: 0.4 MG/DL (ref 0.2–1.0)
CO2: 23 mmol/L (ref 21–32)
Calcium: 9.2 MG/DL (ref 8.5–10.1)
Chloride: 110 mmol/L — ABNORMAL HIGH (ref 97–108)
Creatinine: 1.02 MG/DL (ref 0.55–1.02)
GFR est AA: >60 mL/min/{1.73_m2} (ref >60)
GFR est non-AA: 54 mL/min/{1.73_m2} — ABNORMAL LOW (ref >60)
Globulin: 3.7 g/dL (ref 2.0–4.0)
Glucose: 128 mg/dL — ABNORMAL HIGH (ref 65–100)
Potassium: 3.3 mmol/L — ABNORMAL LOW (ref 3.5–5.1)
Protein, total: 7.8 g/dL (ref 6.4–8.2)
Sodium: 142 mmol/L (ref 136–145)

## 2016-12-24 LAB — BILIRUBIN, CONFIRM: Bilirubin UA, confirm: NEGATIVE

## 2016-12-24 LAB — DRUG SCREEN, URINE
AMPHETAMINES: NEGATIVE
BARBITURATES: NEGATIVE
BENZODIAZEPINES: NEGATIVE
COCAINE: NEGATIVE
METHADONE: NEGATIVE
OPIATES: NEGATIVE
PCP(PHENCYCLIDINE): NEGATIVE
THC (TH-CANNABINOL): NEGATIVE

## 2016-12-24 LAB — ETHYL ALCOHOL: ALCOHOL(ETHYL),SERUM: <10 MG/DL (ref <10)

## 2016-12-24 LAB — TROPONIN I: Troponin-I, Qt.: <0.04 ng/mL (ref <0.05)

## 2016-12-24 LAB — LIPASE: Lipase: 84 U/L (ref 73–393)

## 2016-12-24 LAB — MAGNESIUM: Magnesium: 1.7 mg/dL (ref 1.6–2.4)

## 2016-12-24 LAB — LACTIC ACID: Lactic acid: 1.5 MMOL/L (ref 0.4–2.0)

## 2016-12-24 MED ORDER — NALOXONE 4 MG/ACTUATION NASAL SPRAY
4 mg/actuation | NASAL | 0 refills | Status: DC
Start: 2016-12-24 — End: 2017-09-12

## 2016-12-24 MED ORDER — HYDROCODONE-ACETAMINOPHEN 5 MG-325 MG TAB
5-325 mg | ORAL_TABLET | ORAL | 0 refills | Status: DC | PRN
Start: 2016-12-24 — End: 2017-03-26

## 2016-12-24 MED ORDER — SODIUM CHLORIDE 0.9% BOLUS IV
0.9 % | Freq: Once | INTRAVENOUS | Status: AC
Start: 2016-12-24 — End: 2016-12-24
  Administered 2016-12-24: 21:00:00 via INTRAVENOUS

## 2016-12-24 MED ORDER — ONDANSETRON 4 MG TAB, RAPID DISSOLVE
4 mg | ORAL_TABLET | Freq: Three times a day (TID) | ORAL | 0 refills | Status: DC | PRN
Start: 2016-12-24 — End: 2017-07-18

## 2016-12-24 NOTE — ED Notes (Signed)
Assumed care of pt at this time.  Pt states that she has been having seizures throughout the night and diarrhea since she ate the [employer] pizza.  Pt complains of mid anterior abdominal pain.  Assessment done at this time.  Call bell in reach.
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
## 2016-12-24 NOTE — ED Notes (Signed)
Pt discharged with written instructions and prescriptions at this time.  Pt verbalizes understanding and all questions were answered.  Discharged by Dr.Stratton, in stable condition, with sister.

## 2016-12-24 NOTE — ED Notes (Signed)
Pt back from CT.

## 2016-12-24 NOTE — ED Notes (Signed)
Pt to CT via stretcher.

## 2016-12-25 LAB — EKG, 12 LEAD, INITIAL
Atrial Rate: 88 {beats}/min
Calculated P Axis: 62 degrees
Calculated R Axis: 17 degrees
Calculated T Axis: 31 degrees
Diagnosis: NORMAL
P-R Interval: 134 ms
Q-T Interval: 358 ms
QRS Duration: 66 ms
QTC Calculation (Bezet): 433 ms
Ventricular Rate: 88 {beats}/min

## 2016-12-25 LAB — EKG 12-LEAD
Atrial Rate: 88 {beats}/min
Diagnosis: NORMAL
P Axis: 62 degrees
P-R Interval: 134 ms
Q-T Interval: 358 ms
QRS Duration: 66 ms
QTc Calculation (Bazett): 433 ms
R Axis: 17 degrees
T Axis: 31 degrees
Ventricular Rate: 88 {beats}/min

## 2017-01-04 ENCOUNTER — Ambulatory Visit
Admit: 2017-01-04 | Discharge: 2017-01-04 | Payer: PRIVATE HEALTH INSURANCE | Attending: Nurse Practitioner | Primary: Internal Medicine

## 2017-01-04 DIAGNOSIS — R4182 Altered mental status, unspecified: Secondary | ICD-10-CM

## 2017-01-04 MED ORDER — TOPIRAMATE 200 MG TAB
200 mg | ORAL_TABLET | Freq: Two times a day (BID) | ORAL | 3 refills | Status: DC
Start: 2017-01-04 — End: 2017-01-18

## 2017-01-04 MED ORDER — TOPIRAMATE 100 MG TAB
100 mg | ORAL_TABLET | Freq: Two times a day (BID) | ORAL | 3 refills | Status: DC
Start: 2017-01-04 — End: 2017-01-04

## 2017-01-04 MED ORDER — CLOTRIMAZOLE 1 % TOPICAL CREAM
1 % | Freq: Two times a day (BID) | CUTANEOUS | 0 refills | Status: AC
Start: 2017-01-04 — End: 2017-01-11

## 2017-01-04 NOTE — Patient Instructions (Addendum)
Altered Mental Status: Care Instructions  Your Care Instructions    Altered mental status is a change in how well your brain is working. As a result, you may be confused, be less alert than usual, or act in odd ways. This may include seeing or hearing things that aren't really there (hallucinations).  A mental status change has many possible causes. For example, it may be the result of an infection, an imbalance of chemicals in the body, or a chronic disease such as diabetes or COPD. It can also be caused by things such as a head injury, taking certain medicines, or using alcohol or drugs.  The doctor may do tests to look for the cause. These tests may include urine tests, blood tests, and imaging tests such as a CT scan. Sometimes a clear cause isn't found. But tests can help the doctor rule out a serious cause of your symptoms.  A change in mental status can be scary. But mental status will often return to normal when the cause is treated. So it is important to get any follow-up testing or treatment the doctor has suggested.  The doctor has checked you carefully, but problems can develop later. If you notice any problems or new symptoms, get medical treatment right away.  Follow-up care is a key part of your treatment and safety. Be sure to make and go to all appointments, and call your doctor if you are having problems. It's also a good idea to know your test results and keep a list of the medicines you take.  How can you care for yourself at home?  ?? Be safe with medicines. Take your medicines exactly as prescribed. Call your doctor if you think you are having a problem with your medicine.  ?? Have another adult stay with you until you are better. This can help keep you safe. Ask that person to watch for signs that your mental status is getting worse.  When should you call for help?  Call 911 anytime you think you may need emergency care. For example, call if:  ? ?? You passed out (lost consciousness).    ?Call your doctor now or seek immediate medical care if:  ? ?? Your mental status is getting worse.   ? ?? You have new symptoms, such as a fever, chills, or shortness of breath.   ? ?? You do not feel safe.   ?Watch closely for changes in your health, and be sure to contact your doctor if:  ? ?? You do not get better as expected.   Where can you learn more?  Go to [employer]Stats[employer]cahttp://www.healthwise.net/GoodHelpConnections.  Enter [id number] 505 [id number] in the search box to learn more about "Altered Mental Status: Care Instructions."  Current as of: May 28, 2015  Content Version: 11.4  ?? 2006-2017 [employer]. Care instructions adapted under license by [employer] (which disclaims liability or warranty for this information). If you have questions about a medical condition or this instruction, always ask your healthcare professional. [employer] disclaims any warranty or liability for your use of this information.       Seizure: Care Instructions  Your Care Instructions    Seizures are caused by abnormal patterns of electrical signals in the brain. They are different for each person.  Seizures can affect movement, speech, vision, or awareness. Some people have only slight shaking of a hand and do not pass out. Other people may pass out and have violent shaking of the whole body. Some people  appear to stare into space. They are awake, but they can't respond normally. Later, they may not remember what happened.  You may need tests to identify the type and cause of the seizures.  A seizure may occur only once, or you may have them more than one time. Taking medicines as directed and following up with your doctor may help keep you from having more seizures.  The doctor has checked you carefully, but problems can develop later. If you notice any problems or new symptoms, get medical treatment right away.  Follow-up care is a key part of your treatment and safety. Be sure to make  and go to all appointments, and call your doctor if you are having problems. It's also a good idea to know your test results and keep a list of the medicines you take.  How can you care for yourself at home?  ?? Be safe with medicines. Take your medicines exactly as prescribed. Call your doctor if you think you are having a problem with your medicine.  ?? Do not do any activity that could be dangerous to you or others until your doctor says it is safe to do so. For example, do not drive a car, operate machinery, swim, or climb ladders.  ?? Be sure that anyone treating you for any health problem knows that you have had a seizure and what medicines you are taking for it.  ?? Identify and avoid things that may make you more likely to have a seizure. These may include lack of sleep, alcohol or drug use, stress, or not eating.  ?? Make sure you go to your follow-up appointment.  When should you call for help?  Call 911 anytime you think you may need emergency care. For example, call if:  ? ?? You have another seizure.   ? ?? You have more than one seizure in 24 hours.   ? ?? You have new symptoms, such as trouble walking, speaking, or thinking clearly.   ?Call your doctor now or seek immediate medical care if:  ? ?? You are not acting normally.   ?Watch closely for changes in your health, and be sure to contact your doctor if you have any problems.  Where can you learn more?  Go to [employer].  Enter [id number] in the search box to learn more about "Seizure: Care Instructions."  Current as of: May 28, 2015  Content Version: 11.4  ?? 2006-2017 [employer]. Care instructions adapted under license by [employer] (which disclaims liability or warranty for this information). If you have questions about a medical condition or this instruction, always ask your healthcare professional. [employer] disclaims any warranty or liability for your use of this information.        Candidiasis: Care Instructions  Your Care Instructions  Candidiasis (say "kan-dih-DY-uh-sus") is a yeast infection. Yeast normally lives in your body. But it can cause problems if your body's defenses don't work as they should.  Some medicines can increase your chance of getting a yeast infection. These include antibiotics, steroids, and cancer drugs. And some diseases like AIDS and diabetes can make you more likely to get yeast infections.  There are different types of yeast infections.  Ginette Pitman is a yeast infection in the mouth. It usually occurs in people with weak immune systems. It causes white patches inside the mouth and throat.  Yeast infections of the skin usually occur in skin folds where the skin stays moist. They cause red, oozing  patches on your skin. Babies can get these infections under the diaper. People who often wear gloves can get them on their hands.  Many women get vaginal yeast infections. They are most common when women take antibiotics. These infections can cause the vagina to itch and burn. They also cause white discharge that looks like cottage cheese.  In rare cases, yeast infects the blood. This can cause serious disease. This kind of infection is treated with medicine given through a needle into a vein (IV).  After you start treatment, a yeast infection usually goes away quickly. But if your immune system is weak, the infection may come back. Tell your doctor if you get yeast infections often.  Follow-up care is a key part of your treatment and safety. Be sure to make and go to all appointments, and call your doctor if you are having problems. It's also a good idea to know your test results and keep a list of the medicines you take.  How can you care for yourself at home?  ?? Take your medicines exactly as prescribed. Call your doctor if you think you are having a problem with your medicine.  ?? Use antibiotics only as directed by your doctor.   ?? Eat yogurt with live cultures. It has bacteria called lactobacillus. It may help prevent some types of yeast infections.  ?? Keep your skin clean and dry. Put powder on moist places.  ?? If you are using a cream or suppository to treat a vaginal yeast infection, don't use condoms or a diaphragm. Use a different type of birth control.  ?? Eat a healthy diet and get regular exercise. This will help keep your immune system strong.  When should you call for help?  Watch closely for changes in your health, and be sure to contact your doctor if:  ? ?? You do not get better as expected.   Where can you learn more?  Go to [employer].  Enter [phone] in the search box to learn more about "Candidiasis: Care Instructions."  Current as of: May 27, 2015  Content Version: 11.4  ?? 2006-2017 [employer]. Care instructions adapted under license by [employer] (which disclaims liability or warranty for this information). If you have questions about a medical condition or this instruction, always ask your healthcare professional. [employer] disclaims any warranty or liability for your use of this information.
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
## 2017-01-04 NOTE — Progress Notes (Signed)
Chief Complaint   Patient presents with   ??? Results     1. Have you been to the ER, urgent care clinic since your last visit?  Hospitalized since your last visit?No    2. Have you seen or consulted any other health care providers outside of the [facility] since your last visit?  Include any pap smears or colon screening. No

## 2017-01-04 NOTE — Telephone Encounter (Signed)
Returned call to Sue Lushndrea at [facility] [phone])  As per Dr Gweneth FritterSeabrook, clarified that dosage for pt's topomax should be 200 mg 2 times per day.

## 2017-01-05 ENCOUNTER — Inpatient Hospital Stay: Admit: 2017-01-05 | Payer: MEDICARE | Attending: Nurse Practitioner | Primary: Internal Medicine

## 2017-01-05 DIAGNOSIS — Z1231 Encounter for screening mammogram for malignant neoplasm of breast: Secondary | ICD-10-CM

## 2017-01-10 ENCOUNTER — Ambulatory Visit
Admit: 2017-01-10 | Discharge: 2017-01-10 | Payer: PRIVATE HEALTH INSURANCE | Attending: Neurology | Primary: Internal Medicine

## 2017-01-10 DIAGNOSIS — F0781 Postconcussional syndrome: Secondary | ICD-10-CM

## 2017-01-10 MED ORDER — DIVALPROEX 500 MG TAB, DELAYED RELEASE
500 mg | ORAL_TABLET | ORAL | 2 refills | Status: DC
Start: 2017-01-10 — End: 2017-03-26

## 2017-01-10 MED ORDER — SUMATRIPTAN 100 MG TAB
100 mg | ORAL_TABLET | ORAL | 3 refills | Status: DC
Start: 2017-01-10 — End: 2017-07-18

## 2017-01-10 MED ORDER — METHYLPHENIDATE 5 MG TAB
5 mg | ORAL_TABLET | Freq: Every day | ORAL | 0 refills | Status: DC
Start: 2017-01-10 — End: 2017-01-18

## 2017-01-10 NOTE — Progress Notes (Signed)
Chief Complaint   Patient presents with   ??? Altered mental status   ??? New Patient

## 2017-01-10 NOTE — Patient Instructions (Addendum)
All test results will be discussed at the next appointment.     Electroencephalogram (EEG): About This Test  What is it?    An electroencephalogram (EEG) lets a doctor see the electrical activity of your brain.  You will have small pads or patches attached to different places on your head. These are called electrodes. Wires connect the electrodes to a computer.  The computer records the activity of the brain. This looks like wavy lines on the computer screen or on paper.  Why is this test done?  The test is often used to diagnose epilepsy. It helps a doctor know what types of seizures are happening.  An EEG can also check brain activity in people with sleep disorders.  It can also help a doctor know why a person passed out (lost consciousness).  How can you prepare for the test?  ?? Tell your doctor if you are taking any medicines. Your doctor may ask you to stop taking certain medicines before the test. These include sedatives and tranquilizers, muscle relaxants, sleeping aids, and seizure medicines.  ?? Do not eat or drink anything with caffeine in it for 12 hours before the test. This includes cola, energy drinks, and chocolate.  ?? Shampoo your hair and rinse with clear water the evening before or the morning of the test. Do not put any hair conditioner or oil on after you wash your hair.  ?? Your doctor may ask you not to sleep the night before the test or to sleep for only about 4 or 5 hours. This is because some types of brain activity can only be seen if you are asleep. If your doctor asks you to get less sleep than normal, plan to have someone drive you to and from the test.  What happens during the test?  ?? You will lie on your back on a bed or table. Or you might relax in a chair with your eyes closed.  ?? A technologist will attach the electrodes to different places on your head. Or you might get a cap with fixed electrodes on it.  ?? You will lie still with your eyes closed. The technologist will tell you  not to talk unless you need to.  ?? The technologist may ask you to:  ?? Breathe deeply and rapidly. This is called hyperventilating.  ?? Look at a bright, flashing light called a strobe.  ?? Go to sleep. If you can't fall asleep, you may get medicine to help you.  What else should you know about the test?  ?? There is no pain. No electrical current goes through your body.  ?? If you have a seizure disorder such as epilepsy, the flashing lights or hyperventilation may cause a seizure. The technologist is trained to take care of you if this happens.  ?? If electrodes are put in your nose, they may tickle. In rare cases, they can also cause some soreness or a small amount of bleeding for 1 to 2 days after the test.  How long does the test take?  ?? The test will take about 1 to 2 hours.  What happens after the test?  ?? You will probably be able to go home right away. But if you didn't sleep your normal amount before the test, have someone drive you home.  ?? You can go back to your usual activities right away.  When should you call for help?  Watch closely for changes in your health, and be sure   to contact your doctor if:  ?? You have any problems that you think may be from the test.  ?? You have any questions about the test or have not received your results.  Follow-up care is a key part of your treatment and safety. Be sure to make and go to all appointments, and call your doctor if you are having problems. It's also a good idea to keep a list of the medicines you take. Ask your doctor when you can expect to have your test results.  Where can you learn more?  Go to [employer].  Enter F196 in the search box to learn more about "Electroencephalogram (EEG): About This Test."  Current as of: May 28, 2015  Content Version: 11.4  ?? 2006-2017 [employer]. Care instructions adapted under license by [employer] (which disclaims liability or warranty  for this information). If you have questions about a medical condition or this instruction, always ask your healthcare professional. [employer] disclaims any warranty or liability for your use of this information.       Electromyogram (EMG) and Nerve Conduction Studies: About These Tests  What are they?  An electromyogram (EMG) measures the electrical activity of your muscles when you are not using them (at rest) and when you tighten them (muscle contraction).  Nerve conduction studies (NCS) measure how well and how fast the nerves can send electrical signals.  EMG and nerve conduction studies are often done together. If they are done together, the nerve conduction studies are done before the EMG.  Why are they done?  You may need an EMG to find diseases that damage your muscles or nerves or to find why you cannot move your muscles (paralysis), why they feel weak, or why they twitch.  You may need nerve conduction studies to find damage to the nerves that lead from the brain and spinal cord to the rest of the body (peripheral nervous system). Nerve conduction studies are often used to help find nerve disorders, such as carpal tunnel syndrome.  How can you prepare for these tests?   ?? Tell your doctors ALL the medicines, vitamins, supplements, and herbal remedies you take. Some medicines can affect the test results. You may need to stop taking some medicines before you have this test.   ? ?? If you take aspirin or some other blood thinner, be sure to talk to your doctor. He or she will tell you if you should stop taking it before your test. Make sure that you understand exactly what your doctor wants you to do.   ? ?? Wear loose-fitting clothing. You may be given a hospital gown to wear.   ? ?? The electrodes for the test are attached to your skin. Your skin needs to be clean and free of sprays, oils, creams, and lotions.   What happens during the tests?   You lie on a table or bed or sit in a reclining chair so your muscles are relaxed.  For an EMG:  ?? Your doctor will insert a needle electrode into a muscle. This will record the electrical activity while the muscle is at rest. You may feel a quick, sharp pain when the needle electrode is put into a muscle.  ?? Your doctor will ask you to tighten the same muscle slowly and steadily while the electrical activity is recorded.  ?? Your doctor may move the electrode to a different area of the muscle or a different muscle.  For nerve conduction  studies:  ?? Your doctor will attach two types of electrodes to your skin.  ?? One type of electrode is placed over a nerve and will give the nerve an electrical pulse.  ?? The other type of electrode is placed over the muscle that the nerve controls. It will record how long it takes the muscle to react to the electrical pulse.  ?? You will be able to feel the electrical pulses. They are small shocks and are safe.  What else should you know about these tests?  ?? After an EMG, you may be sore and have a tingling feeling in your muscles for up to 2 days. You may have small bruises or swelling at the needle site.  ?? For an EMG, you may be asked to sign a consent form. Talk to your doctor about any concerns you have about the need for the test, its risks, how it will be done, or what the results will mean.  How long do they take?  ?? An EMG may take 30 to 60 minutes.  ?? Nerve conduction tests may take from 15 minutes to 1 hour or more. It depends on how many nerves and muscles your doctor tests.  What happens after these tests?  ?? If any of the test areas are sore:  ?? Put ice or a cold pack on the area for 10 to 20 minutes at a time. Put a thin cloth between the ice and your skin.  ?? Take an over-the-counter pain medicine, such as acetaminophen (Tylenol), ibuprofen (Advil, Motrin), or naproxen (Aleve). Be safe with medicines. Read and follow all instructions on the label.   ?? You will probably be able to go home right away.  ?? You can go back to your usual activities right away.  When should you call for help?  Watch closely for changes in your health, and be sure to contact your doctor if:  ?? Muscle pain from an EMG test gets worse or you have swelling, tenderness, or pus at any of the needle sites.  ?? You have any problems that you think may be from the test.  ?? You have any questions about the test or have not received your results.  Follow-up care is a key part of your treatment and safety. Be sure to make and go to all appointments, and call your doctor if you are having problems. It's also a good idea to keep a list of the medicines you take. Ask your doctor when you can expect to have your test results.  Where can you learn more?  Go to [employer]cahttp://www.healthwise.net/[employer].  Enter [id number] in the search box to learn more about "Electromyogram (EMG) and Nerve Conduction Studies: About These Tests."  Current as of: May 28, 2015  Content Version: 11.4  ?? 2006-2017 [employer]. Care instructions adapted under license by [employer] (which disclaims liability or warranty for this information). If you have questions about a medical condition or this instruction, always ask your healthcare professional. [employer] disclaims any warranty or liability for your use of this information.
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
## 2017-01-12 LAB — ANGIOTENSIN CONVERTING ENZYME: Angiotensin Converting Enzyme (ACE): 67 U/L (ref 14–82)

## 2017-01-12 LAB — ALDOLASE: Aldolase: 2.5 U/L — ABNORMAL LOW (ref 3.3–10.3)

## 2017-01-12 LAB — RPR
RPR: NONREACTIVE
RPR: NONREACTIVE

## 2017-01-12 LAB — ANA, DIRECT, W/REFLEX
ANA, DIRECT: NEGATIVE
Antinuclear Antibodies Direct: NEGATIVE

## 2017-01-12 LAB — VITAMIN B12: Vitamin B12: 591 pg/mL (ref 232–1245)

## 2017-01-12 LAB — CK: Creatine Kinase,Total: 107 U/L (ref 24–173)

## 2017-01-12 LAB — VITAMIN D, 1, 25 DIHYDROXY: Calcitriol (Vit D 1, 25 di-OH): 35.3 pg/mL (ref 19.9–79.3)

## 2017-01-12 LAB — VITAMIN B6: Vitamin B6: 6.6 ug/L (ref 2.0–32.8)

## 2017-01-12 LAB — SED RATE (ESR): Sed rate (ESR): 3 mm/hr (ref 0–40)

## 2017-01-18 ENCOUNTER — Ambulatory Visit: Admit: 2017-01-18 | Discharge: 2017-01-18 | Payer: MEDICARE | Attending: Nurse Practitioner | Primary: Internal Medicine

## 2017-01-18 DIAGNOSIS — F0781 Postconcussional syndrome: Secondary | ICD-10-CM

## 2017-01-18 MED ORDER — CHOLECALCIFEROL (VITAMIN D3) 1,000 UNIT (25 MCG) TAB
ORAL_TABLET | Freq: Every day | ORAL | 3 refills | Status: DC
Start: 2017-01-18 — End: 2017-02-26

## 2017-01-18 MED ORDER — METHYLPHENIDATE 5 MG TAB
5 mg | ORAL_TABLET | Freq: Every day | ORAL | 0 refills | Status: DC
Start: 2017-01-18 — End: 2017-04-13

## 2017-01-18 NOTE — Telephone Encounter (Signed)
Wants to speak with you regarding medications
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
## 2017-01-18 NOTE — Patient Instructions (Addendum)
Seizure: Care Instructions  Your Care Instructions    Seizures are caused by abnormal patterns of electrical signals in the brain. They are different for each person.  Seizures can affect movement, speech, vision, or awareness. Some people have only slight shaking of a hand and do not pass out. Other people may pass out and have violent shaking of the whole body. Some people appear to stare into space. They are awake, but they can't respond normally. Later, they may not remember what happened.  You may need tests to identify the type and cause of the seizures.  A seizure may occur only once, or you may have them more than one time. Taking medicines as directed and following up with your doctor may help keep you from having more seizures.  The doctor has checked you carefully, but problems can develop later. If you notice any problems or new symptoms, get medical treatment right away.  Follow-up care is a key part of your treatment and safety. Be sure to make and go to all appointments, and call your doctor if you are having problems. It's also a good idea to know your test results and keep a list of the medicines you take.  How can you care for yourself at home?  ?? Be safe with medicines. Take your medicines exactly as prescribed. Call your doctor if you think you are having a problem with your medicine.  ?? Do not do any activity that could be dangerous to you or others until your doctor says it is safe to do so. For example, do not drive a car, operate machinery, swim, or climb ladders.  ?? Be sure that anyone treating you for any health problem knows that you have had a seizure and what medicines you are taking for it.  ?? Identify and avoid things that may make you more likely to have a seizure. These may include lack of sleep, alcohol or drug use, stress, or not eating.  ?? Make sure you go to your follow-up appointment.  When should you call for help?   Call 911 anytime you think you may need emergency care. For example, call if:  ? ?? You have another seizure.   ? ?? You have more than one seizure in 24 hours.   ? ?? You have new symptoms, such as trouble walking, speaking, or thinking clearly.   ?Call your doctor now or seek immediate medical care if:  ? ?? You are not acting normally.   ?Watch closely for changes in your health, and be sure to contact your doctor if you have any problems.  Where can you learn more?  Go to [employer].  Enter [phone] in the search box to learn more about "Seizure: Care Instructions."  Current as of: May 28, 2015  Content Version: 11.4  ?? 2006-2017 [employer]. Care instructions adapted under license by [employer] (which disclaims liability or warranty for this information). If you have questions about a medical condition or this instruction, always ask your healthcare professional. Healthwise, Incorporated disclaims any warranty or liability for your use of this information.    Seizure: After Your Visit to the Emergency Room  Your Care Instructions  You were seen in the emergency room because you had a seizure. There are different kinds of seizures. They can affect movement, speech, vision, or awareness. Some people may pass out, and their bodies may shake or jerk. Other people stare off into space and are not aware of what  is happening around them. You may or may not remember the seizure afterward.  The type of care you need depends on the type of seizure you had and whether you have any injuries from the seizure. A seizure may happen only once, or it may be repeated. Follow the doctor's instructions about whether you need more testing to find out what kind of seizure you had and why you had it.  Even though you have been released from the emergency room, you still need to watch for any problems. The doctor carefully checked you. But sometimes  problems can develop later. If you have new symptoms, or if your symptoms do not get better, return to the emergency room or call your doctor right away.  A visit to the emergency room is only one step in your treatment. Even if you feel better, you still need to do what your doctor recommends, such as going to all suggested follow-up appointments and taking medicines exactly as directed. This will help you recover and help prevent future problems.  How can you care for yourself at home?  ?? Take your medicines exactly as prescribed. Call your doctor if you think you are having a problem with your medicine.  ?? Do not drive a car, operate machinery, swim, climb ladders, or do any other activity that could be dangerous to you or others until your doctor says it is okay.  ?? Be sure that anyone treating you for any health problem knows that you have had a seizure.  ?? Identify and avoid things that may make you more likely to have a seizure, such as lack of sleep, alcohol or drug use, stress, or not eating.  When should you call for help?  Call 911 if:  ?? You have another seizure.  ?? You have new symptoms such as:  ?? Numbness, tingling, or weakness on one side of your body or face.  ?? Vision changes.  ?? Trouble speaking or thinking clearly.  Return to the emergency room now if:  ?? You develop a fever.  ?? You develop a severe headache.  Call your doctor today if:  ?? You have any problems with your medicine.  ?? You have any questions about your medicine.   Where can you learn more?   Go to [email or web address]  Enter G389 in the search box to learn more about "Seizure: After Your Visit to the Emergency Room."   ?? 2006-2012 Healthwise, Inc[employer]. Care instructions adapted under license by [employer] (which disclaims liability or warranty for this information). This care instruction is for use with your licensed healthcare professional. If you have questions about a medical condition  or this instruction, always ask your healthcare professional. Healthwise, Incorporated disclaims any warranty or liability for your use of this information.  Content Version: 9.4.94723; Last Revised: May 18, 2009

## 2017-01-18 NOTE — Progress Notes (Signed)
Chief Complaint   Patient presents with   ??? Altered mental status     f/u   ??? Seizure     f/u   ??? Hospital Follow Up     seizure     1. Have you been to the ER, urgent care clinic since your last visit?  Hospitalized since your last visit?Yes When: 01/16/17 Where: [facility] ED Reason for visit: seizure    2. Have you seen or consulted any other health care providers outside of [facility] since your last visit?  Include any pap smears or colon screening. Yes When: 01/16/17 Where: [facility] ED Reason for visit: seizure    Fall Risk Assessment, last 12 mths 01/18/2017   Able to walk? Yes   Fall in past 12 months? Yes   Fall with injury? Yes   Number of falls in past 12 months 1   Fall Risk Score 2       Abuse Screening Questionnaire 01/18/2017   Do you ever feel afraid of your partner? N   Are you in a relationship with someone who physically or mentally threatens you? N   Is it safe for you to go home? YJeannie [name]

## 2017-01-19 ENCOUNTER — Encounter: Primary: Internal Medicine

## 2017-01-20 ENCOUNTER — Encounter: Admit: 2017-01-20 | Discharge: 2017-01-20 | Primary: Internal Medicine

## 2017-01-29 NOTE — Telephone Encounter (Signed)
Called patient regarding scheduling EEG patient does not have a voicemail set up, unable to contact.

## 2017-01-30 ENCOUNTER — Encounter: Primary: Internal Medicine

## 2017-01-30 NOTE — Telephone Encounter (Signed)
Spoke with patient, ID verified times 2. Patient stated [facility] and they will discuss the results with the patient.

## 2017-02-02 ENCOUNTER — Ambulatory Visit: Payer: MEDICARE | Primary: Internal Medicine

## 2017-02-06 ENCOUNTER — Encounter: Primary: Internal Medicine

## 2017-02-13 MED ORDER — GABAPENTIN 100 MG CAP
100 mg | ORAL_CAPSULE | ORAL | 0 refills | Status: DC
Start: 2017-02-13 — End: 2017-03-30

## 2017-02-13 MED ORDER — CITALOPRAM 20 MG TAB
20 mg | ORAL_TABLET | ORAL | 0 refills | Status: DC
Start: 2017-02-13 — End: 2017-03-12

## 2017-02-13 MED ORDER — METFORMIN 500 MG TAB
500 mg | ORAL_TABLET | ORAL | 1 refills | Status: DC
Start: 2017-02-13 — End: 2017-03-12

## 2017-02-13 MED ORDER — CHOLECALCIFEROL (VITAMIN D3) 1,000 UNIT CAPSULE
25 mcg (1,000 unit) | ORAL_CAPSULE | ORAL | 0 refills | Status: DC
Start: 2017-02-13 — End: 2017-09-10

## 2017-02-13 MED ORDER — ASPIRIN 81 MG TAB, DELAYED RELEASE
81 mg | ORAL_TABLET | ORAL | 0 refills | Status: DC
Start: 2017-02-13 — End: 2017-03-26

## 2017-02-13 MED ORDER — FENOFIBRATE 54 MG TAB
54 mg | ORAL_TABLET | ORAL | 0 refills | Status: DC
Start: 2017-02-13 — End: 2017-03-26

## 2017-02-19 ENCOUNTER — Encounter: Attending: Nurse Practitioner | Primary: Internal Medicine

## 2017-02-20 ENCOUNTER — Encounter: Attending: Nurse Practitioner | Primary: Internal Medicine

## 2017-02-22 ENCOUNTER — Encounter: Attending: Nurse Practitioner | Primary: Internal Medicine

## 2017-02-26 ENCOUNTER — Ambulatory Visit
Admit: 2017-02-26 | Discharge: 2017-02-26 | Payer: PRIVATE HEALTH INSURANCE | Attending: Nurse Practitioner | Primary: Internal Medicine

## 2017-02-26 DIAGNOSIS — G40909 Epilepsy, unspecified, not intractable, without status epilepticus: Secondary | ICD-10-CM

## 2017-02-26 MED ORDER — LISINOPRIL 5 MG TAB
5 mg | ORAL_TABLET | Freq: Every day | ORAL | 6 refills | Status: DC
Start: 2017-02-26 — End: 2017-06-13

## 2017-02-26 MED ORDER — FLUTICASONE 50 MCG/ACTUATION NASAL SPRAY, SUSP
50 mcg/actuation | NASAL | 3 refills | Status: DC
Start: 2017-02-26 — End: 2017-05-11
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
## 2017-02-26 NOTE — Progress Notes (Signed)
Chief Complaint   Patient presents with   ??? Seizure   ??? Medication Evaluation     1. Have you been to the ER, urgent care clinic since your last visit?  Hospitalized since your last visit?Yes Reason for visit: fall    2. Have you seen or consulted any other health care providers outside of the [facility] since your last visit?  Include any pap smears or colon screening. Yes Reason for visit: emergency

## 2017-02-26 NOTE — Patient Instructions (Addendum)
Allergies: Care Instructions  Your Care Instructions    Allergies occur when your body's defense system (immune system) overreacts to certain substances. The immune system treats a harmless substance as if it were a harmful germ or virus. Many things can cause this overreaction, including pollens, medicine, food, dust, animal dander, and mold.  Allergies can be mild or severe. Mild allergies can be managed with home treatment. But medicine may be needed to prevent problems.  Managing your allergies is an important part of staying healthy. Your doctor may suggest that you have allergy testing to help find out what is causing your allergies. When you know what things trigger your symptoms, you can avoid them. This can prevent allergy symptoms and other health problems.  For severe allergies that cause reactions that affect your whole body (anaphylactic reactions), your doctor may prescribe a shot of epinephrine to carry with you in case you have a severe reaction. Learn how to give yourself the shot and keep it with you at all times. Make sure it is not expired.  Follow-up care is a key part of your treatment and safety. Be sure to make and go to all appointments, and call your doctor if you are having problems. It's also a good idea to know your test results and keep a list of the medicines you take.  How can you care for yourself at home?  ?? If you have been told by your doctor that dust or dust mites are causing your allergy, decrease the dust around your bed:  ?? Wash sheets, pillowcases, and other bedding in hot water every week.  ?? Use dust-proof covers for pillows, duvets, and mattresses. Avoid plastic covers because they tear easily and do not "breathe." Wash as instructed on the label.  ?? Do not use any blankets and pillows that you do not need.  ?? Use blankets that you can wash in your washing machine.  ?? Consider removing drapes and carpets, which attract and hold dust, from your bedroom.   ?? If you are allergic to house dust and mites, do not use home humidifiers. Your doctor can suggest ways you can control dust and mites.  ?? Look for signs of cockroaches. Cockroaches cause allergic reactions. Use cockroach baits to get rid of them. Then, clean your home well. Cockroaches like areas where grocery bags, newspapers, empty bottles, or cardboard boxes are stored. Do not keep these inside your home, and keep trash and food containers sealed. Seal off any spots where cockroaches might enter your home.  ?? If you are allergic to mold, get rid of furniture, rugs, and drapes that smell musty. Check for mold in the bathroom.  ?? If you are allergic to outdoor pollen or mold spores, use air-conditioning. Change or clean all filters every month. Keep windows closed.  ?? If you are allergic to pollen, stay inside when pollen counts are high. Use a vacuum cleaner with a HEPA filter or a double-thickness filter at least two times each week.  ?? Stay inside when air pollution is bad. Avoid paint fumes, perfumes, and other strong odors.  ?? Avoid conditions that make your allergies worse. Stay away from smoke. Do not smoke or let anyone else smoke in your house. Do not use fireplaces or wood-burning stoves.  ?? If you are allergic to your pets, change the air filter in your furnace every month. Use high-efficiency filters.  ?? If you are allergic to pet dander, keep pets outside or out of   your bedroom. Old carpet and cloth furniture can hold a lot of animal dander. You may need to replace them.  When should you call for help?  Give an epinephrine shot if:  ?? ?? You think you are having a severe allergic reaction.   ?? ?? You have symptoms in more than one body area, such as mild nausea and an itchy mouth.   ??After giving an epinephrine shot call 911, even if you feel better.  ??Call 911 if:  ?? ?? You have symptoms of a severe allergic reaction. These may include:  ?? Sudden raised, red areas (hives) all over your body.   ?? Swelling of the throat, mouth, lips, or tongue.  ?? Trouble breathing.  ?? Passing out (losing consciousness). Or you may feel very lightheaded or suddenly feel weak, confused, or restless.   ?? ?? You have been given an epinephrine shot, even if you feel better.   ??Call your doctor now or seek immediate medical care if:  ?? ?? You have symptoms of an allergic reaction, such as:  ?? A rash or hives (raised, red areas on the skin).  ?? Itching.  ?? Swelling.  ?? Belly pain, nausea, or vomiting.   ??Watch closely for changes in your health, and be sure to contact your doctor if:  ?? ?? You do not get better as expected.   Where can you learn more?  Go to [employer].  Enter [phone] in the search box to learn more about "Allergies: Care Instructions."  Current as of: May 19, 2016  Content Version: 11.7  ?? 2006-2018 [employer]. Care instructions adapted under license by [employer] (which disclaims liability or warranty for this information). If you have questions about a medical condition or this instruction, always ask your healthcare professional. [employer] disclaims any warranty or liability for your use of this information.       Altered Mental Status: Care Instructions  Your Care Instructions    Altered mental status is a change in how well your brain is working. As a result, you may be confused, be less alert than usual, or act in odd ways. This may include seeing or hearing things that aren't really there (hallucinations).  A mental status change has many possible causes. For example, it may be the result of an infection, an imbalance of chemicals in the body, or a chronic disease such as diabetes or COPD. It can also be caused by things such as a head injury, taking certain medicines, or using alcohol or drugs.  The doctor may do tests to look for the cause. These tests may include  urine tests, blood tests, and imaging tests such as a CT scan. Sometimes a clear cause isn't found. But tests can help the doctor rule out a serious cause of your symptoms.  A change in mental status can be scary. But mental status will often return to normal when the cause is treated. So it is important to get any follow-up testing or treatment the doctor has suggested.  The doctor has checked you carefully, but problems can develop later. If you notice any problems or new symptoms, get medical treatment right away.  Follow-up care is a key part of your treatment and safety. Be sure to make and go to all appointments, and call your doctor if you are having problems. It's also a good idea to know your test results and keep a list of the medicines you take.  How can you  care for yourself at home?  ?? Be safe with medicines. Take your medicines exactly as prescribed. Call your doctor if you think you are having a problem with your medicine.  ?? Have another adult stay with you until you are better. This can help keep you safe. Ask that person to watch for signs that your mental status is getting worse.  When should you call for help?  Call 911 anytime you think you may need emergency care. For example, call if:  ?? ?? You passed out (lost consciousness).   ??Call your doctor now or seek immediate medical care if:  ?? ?? Your mental status is getting worse.   ?? ?? You have new symptoms, such as a fever, chills, or shortness of breath.   ?? ?? You do not feel safe.   ??Watch closely for changes in your health, and be sure to contact your doctor if:  ?? ?? You do not get better as expected.   Where can you learn more?  Go to [employer]://www.healthwise.net/[employer].  Enter [phone] in the search box to learn more about "Altered Mental Status: Care Instructions."  Current as of: May 22, 2016  Content Version: 11.7  ?? 2006-2018 [employer]. Care instructions adapted under  license by [employer] (which disclaims liability or warranty for this information). If you have questions about a medical condition or this instruction, always ask your healthcare professional. [employer] disclaims any warranty or liability for your use of this information.       Seizure: Care Instructions  Your Care Instructions    Seizures are caused by abnormal patterns of electrical signals in the brain. They are different for each person.  Seizures can affect movement, speech, vision, or awareness. Some people have only slight shaking of a hand and do not pass out. Other people may pass out and have violent shaking of the whole body. Some people appear to stare into space. They are awake, but they can't respond normally. Later, they may not remember what happened.  You may need tests to identify the type and cause of the seizures.  A seizure may occur only once, or you may have them more than one time. Taking medicines as directed and following up with your doctor may help keep you from having more seizures.  The doctor has checked you carefully, but problems can develop later. If you notice any problems or new symptoms, get medical treatment right away.  Follow-up care is a key part of your treatment and safety. Be sure to make and go to all appointments, and call your doctor if you are having problems. It's also a good idea to know your test results and keep a list of the medicines you take.  How can you care for yourself at home?  ?? Be safe with medicines. Take your medicines exactly as prescribed. Call your doctor if you think you are having a problem with your medicine.  ?? Do not do any activity that could be dangerous to you or others until your doctor says it is safe to do so. For example, do not drive a car, operate machinery, swim, or climb ladders.  ?? Be sure that anyone treating you for any health problem knows that you  have had a seizure and what medicines you are taking for it.  ?? Identify and avoid things that may make you more likely to have a seizure. These may include lack of sleep, alcohol or drug use, stress, or  not eating.  ?? Make sure you go to your follow-up appointment.  When should you call for help?  Call 911 anytime you think you may need emergency care. For example, call if:  ?? ?? You have another seizure.   ?? ?? You have more than one seizure in 24 hours.   ?? ?? You have new symptoms, such as trouble walking, speaking, or thinking clearly.   ??Call your doctor now or seek immediate medical care if:  ?? ?? You are not acting normally.   ??Watch closely for changes in your health, and be sure to contact your doctor if you have any problems.  Where can you learn more?  Go to [employer].  Enter [phone] in the search box to learn more about "Seizure: Care Instructions."  Current as of: May 22, 2016  Content Version: 11.7  ?? 2006-2018 [employer]. Care instructions adapted under license by [employer] (which disclaims liability or warranty for this information). If you have questions about a medical condition or this instruction, always ask your healthcare professional. [employer] disclaims any warranty or liability for your use of this information.

## 2017-03-01 LAB — VALPROIC ACID: Valproic acid: 44 ug/mL — ABNORMAL LOW (ref 50–100)

## 2017-03-12 ENCOUNTER — Ambulatory Visit
Admit: 2017-03-12 | Discharge: 2017-03-12 | Payer: PRIVATE HEALTH INSURANCE | Attending: Nurse Practitioner | Primary: Internal Medicine

## 2017-03-12 DIAGNOSIS — G40909 Epilepsy, unspecified, not intractable, without status epilepticus: Secondary | ICD-10-CM

## 2017-03-12 MED ORDER — METFORMIN 500 MG TAB
500 mg | ORAL_TABLET | Freq: Two times a day (BID) | ORAL | 6 refills | Status: DC
Start: 2017-03-12 — End: 2017-03-26

## 2017-03-12 MED ORDER — CITALOPRAM 20 MG TAB
20 mg | ORAL_TABLET | Freq: Every day | ORAL | 3 refills | Status: DC
Start: 2017-03-12 — End: 2017-09-10

## 2017-03-12 MED ORDER — LACOSAMIDE 150 MG TAB
150 mg | ORAL_TABLET | Freq: Two times a day (BID) | ORAL | 0 refills | Status: DC
Start: 2017-03-12 — End: 2017-05-01

## 2017-03-12 MED ORDER — ATORVASTATIN 10 MG TAB
10 mg | ORAL_TABLET | Freq: Every day | ORAL | 6 refills | Status: DC
Start: 2017-03-12 — End: 2017-03-26

## 2017-03-12 NOTE — Progress Notes (Signed)
Meghan FuchsBarbara J [name] is a 71 y.o. female    Chief Complaint   Patient presents with   ??? Hypertension   ??? Follow-up     1. Have you been to the ER, urgent care clinic since your last visit?  Hospitalized since your last visit? No     2. Have you seen or consulted any other health care providers outside of the [facility] since your last visit?  Include any pap smears or colon screening.  No     Visit Vitals   ??? BP 128/72   ??? Pulse 87   ??? Temp 98.6 ??F (37 ??C) (Oral)   ??? Resp 18   ??? Ht 5\' 7"  (1.702 m)   ??? Wt 176 lb 12.8 oz (80.2 kg)   ??? SpO2 97%   ??? BMI 27.69 kg/m2

## 2017-03-12 NOTE — Patient Instructions (Addendum)
Type 2 Diabetes: Care Instructions  Your Care Instructions    Type 2 diabetes is a disease that develops when the body's tissues cannot use insulin properly. Over time, the pancreas cannot make enough insulin. Insulin is a hormone that helps the body's cells use sugar (glucose) for energy. It also helps the body store extra sugar in muscle, fat, and liver cells.  Without insulin, the sugar cannot get into the cells to do its work. It stays in the blood instead. This can cause high blood sugar levels. A person has diabetes when the blood sugar stays too high too much of the time. Over time, diabetes can lead to diseases of the heart, blood vessels, nerves, kidneys, and eyes.  You may be able to control your blood sugar by losing weight, eating a healthy diet, and getting daily exercise. You may also have to take insulin or other diabetes medicine.  Follow-up care is a key part of your treatment and safety. Be sure to make and go to all appointments. Call your doctor if you are having problems. It's also a good idea to know your test results and keep a list of the medicines you take.  How can you care for yourself at home?  ?? Keep your blood sugar at a target level (which you set with your doctor).  ?? Eat a good diet that spreads carbohydrate throughout the day. Carbohydrate-the body's main source of fuel-affects blood sugar more than any other nutrient. Carbohydrate is in fruits, vegetables, milk, and yogurt. It also is in breads, cereals, vegetables such as potatoes and corn, and sugary foods such as candy and cakes.  ?? Aim for 30 minutes of exercise on most, preferably all, days of the week. Walking is a good choice. You also may want to do other activities, such as running, swimming, cycling, or playing tennis or team sports. If your doctor says it's okay, do muscle-strengthening exercises at least 2 times a week.  ?? Take your medicines exactly as prescribed. Call your doctor if you think  you are having a problem with your medicine. You will get more details on the specific medicines your doctor prescribes.  ?? Check your blood sugar as often as your doctor recommends. It is important to keep track of any symptoms you have, such as low blood sugar. Also tell your doctor if you have any changes in your activities, diet, or insulin use.  ?? Talk to your doctor before you start taking aspirin every day. Aspirin can help certain people lower their risk of a heart attack or stroke. But taking aspirin isn't right for everyone, because it can cause serious bleeding.  ?? Do not smoke. If you need help quitting, talk to your doctor about stop-smoking programs and medicines. These can increase your chances of quitting for good.  ?? Keep your cholesterol and blood pressure at normal levels. You may need to take one or more medicines to reach your goals. Take them exactly as directed. Do not stop or change a medicine without talking to your doctor first.  When should you call for help?  Call 911 anytime you think you may need emergency care. For example, call if:  ?? ?? You passed out (lost consciousness), or you suddenly become very sleepy or confused. (You may have very low blood sugar.)   ??Call your doctor now or seek immediate medical care if:  ?? ?? Your blood sugar is 300 mg/dL or is higher than the level your doctor has   set for you.   ?? ?? You have symptoms of low blood sugar, such as:  ?? Sweating.  ?? Feeling nervous, shaky, and weak.  ?? Extreme hunger and slight nausea.  ?? Dizziness and headache.  ?? Blurred vision.  ?? Confusion.   ??Watch closely for changes in your health, and be sure to contact your doctor if:  ?? ?? You often have problems controlling your blood sugar.   ?? ?? You have symptoms of long-term diabetes problems, such as:  ?? New vision changes.  ?? New pain, numbness, or tingling in your hands or feet.  ?? Skin problems.   Where can you learn more?  Go to [email or web address].   Enter C553 in the search box to learn more about "Type 2 Diabetes: Care Instructions."  Current as of: July 20, 2016  Content Version: 11.7  ?? 2006-2018 [employer]. Care instructions adapted under license by [employer] (which disclaims liability or warranty for this information). If you have questions about a medical condition or this instruction, always ask your healthcare professional. [place] any warranty or liability for your use of this information.       High Blood Pressure: Care Instructions  Your Care Instructions    If your blood pressure is usually above 130/80, you have high blood pressure, or hypertension. That means the top number is 130 or higher or the bottom number is 80 or higher, or both.  Despite what a lot of people think, high blood pressure usually doesn't cause headaches or make you feel dizzy or lightheaded. It usually has no symptoms. But it does increase your risk for heart attack, stroke, and kidney or eye damage. The higher your blood pressure, the more your risk increases.  Your doctor will give you a goal for your blood pressure. Your goal will be based on your health and your age.  Lifestyle changes, such as eating healthy and being active, are always important to help lower blood pressure. You might also take medicine to reach your blood pressure goal.  Follow-up care is a key part of your treatment and safety. Be sure to make and go to all appointments, and call your doctor if you are having problems. It's also a good idea to know your test results and keep a list of the medicines you take.  How can you care for yourself at home?  Medical treatment  ?? If you stop taking your medicine, your blood pressure will go back up. You may take one or more types of medicine to lower your blood pressure. Be safe with medicines. Take your medicine exactly as prescribed. Call  your doctor if you think you are having a problem with your medicine.  ?? Talk to your doctor before you start taking aspirin every day. Aspirin can help certain people lower their risk of a heart attack or stroke. But taking aspirin isn't right for everyone, because it can cause serious bleeding.  ?? See your doctor regularly. You may need to see the doctor more often at first or until your blood pressure comes down.  ?? If you are taking blood pressure medicine, talk to your doctor before you take decongestants or anti-inflammatory medicine, such as ibuprofen. Some of these medicines can raise blood pressure.  ?? Learn how to check your blood pressure at home.  Lifestyle changes  ?? Stay at a healthy weight. This is especially important if you put on weight around the waist. Losing even 10 pounds  can help you lower your blood pressure.  ?? If your doctor recommends it, get more exercise. Walking is a good choice. Bit by bit, increase the amount you walk every day. Try for at least 30 minutes on most days of the week. You also may want to swim, bike, or do other activities.  ?? Avoid or limit alcohol. Talk to your doctor about whether you can drink any alcohol.  ?? Try to limit how much sodium you eat to less than 2,300 milligrams (mg) a day. Your doctor may ask you to try to eat less than 1,500 mg a day.  ?? Eat plenty of fruits (such as bananas and oranges), vegetables, legumes, whole grains, and low-fat dairy products.  ?? Lower the amount of saturated fat in your diet. Saturated fat is found in animal products such as milk, cheese, and meat. Limiting these foods may help you lose weight and also lower your risk for heart disease.  ?? Do not smoke. Smoking increases your risk for heart attack and stroke. If you need help quitting, talk to your doctor about stop-smoking programs and medicines. These can increase your chances of quitting for good.  When should you call for help?   Call 911 anytime you think you may need emergency care. This may mean having symptoms that suggest that your blood pressure is causing a serious heart or blood vessel problem. Your blood pressure may be over 180/110.  ??For example, call 911 if:  ?? ?? You have symptoms of a heart attack. These may include:  ?? Chest pain or pressure, or a strange feeling in the chest.  ?? Sweating.  ?? Shortness of breath.  ?? Nausea or vomiting.  ?? Pain, pressure, or a strange feeling in the back, neck, jaw, or upper belly or in one or both shoulders or arms.  ?? Lightheadedness or sudden weakness.  ?? A fast or irregular heartbeat.   ?? ?? You have symptoms of a stroke. These may include:  ?? Sudden numbness, tingling, weakness, or loss of movement in your face, arm, or leg, especially on only one side of your body.  ?? Sudden vision changes.  ?? Sudden trouble speaking.  ?? Sudden confusion or trouble understanding simple statements.  ?? Sudden problems with walking or balance.  ?? A sudden, severe headache that is different from past headaches.   ?? ?? You have severe back or belly pain.   ??Do not wait until your blood pressure comes down on its own. Get help right away.  ??Call your doctor now or seek immediate care if:  ?? ?? Your blood pressure is much higher than normal (such as 180/110 or higher), but you don't have symptoms.   ?? ?? You think high blood pressure is causing symptoms, such as:  ?? Severe headache.  ?? Blurry vision.   ??Watch closely for changes in your health, and be sure to contact your doctor if:  ?? ?? Your blood pressure measures 140/90 or higher at least 2 times. That means the top number is 140 or higher or the bottom number is 90 or higher, or both.   ?? ?? You think you may be having side effects from your blood pressure medicine.   ?? ?? Your blood pressure is usually normal, but it goes above normal at least 2 times.   Where can you learn more?  Go to [email or web address].   Enter [phone] in the search box to learn more about "High Blood Pressure: Care  Instructions."  Current as of: July 19, 2016  Content Version: 11.7  ?? 2006-2018 [employer]. Care instructions adapted under license by [employer] (which disclaims liability or warranty for this information). If you have questions about a medical condition or this instruction, always ask your healthcare professional. [employer], [employer] disclaims any warranty or liability for your use of this information.       Seizure: Care Instructions  Your Care Instructions    Seizures are caused by abnormal patterns of electrical signals in the brain. They are different for each person.  Seizures can affect movement, speech, vision, or awareness. Some people have only slight shaking of a hand and do not pass out. Other people may pass out and have violent shaking of the whole body. Some people appear to stare into space. They are awake, but they can't respond normally. Later, they may not remember what happened.  You may need tests to identify the type and cause of the seizures.  A seizure may occur only once, or you may have them more than one time. Taking medicines as directed and following up with your doctor may help keep you from having more seizures.  The doctor has checked you carefully, but problems can develop later. If you notice any problems or new symptoms, get medical treatment right away.  Follow-up care is a key part of your treatment and safety. Be sure to make and go to all appointments, and call your doctor if you are having problems. It's also a good idea to know your test results and keep a list of the medicines you take.  How can you care for yourself at home?  ?? Be safe with medicines. Take your medicines exactly as prescribed. Call your doctor if you think you are having a problem with your medicine.  ?? Do not do any activity that could be dangerous to you or others until  your doctor says it is safe to do so. For example, do not drive a car, operate machinery, swim, or climb ladders.  ?? Be sure that anyone treating you for any health problem knows that you have had a seizure and what medicines you are taking for it.  ?? Identify and avoid things that may make you more likely to have a seizure. These may include lack of sleep, alcohol or drug use, stress, or not eating.  ?? Make sure you go to your follow-up appointment.  When should you call for help?  Call 911 anytime you think you may need emergency care. For example, call if:  ?? ?? You have another seizure.   ?? ?? You have more than one seizure in 24 hours.   ?? ?? You have new symptoms, such as trouble walking, speaking, or thinking clearly.   ??Call your doctor now or seek immediate medical care if:  ?? ?? You are not acting normally.   ??Watch closely for changes in your health, and be sure to contact your doctor if you have any problems.  Where can you learn more?  Go to [email or web address]://www.[email or web address].net/[employer].  Enter [phone] in the search box to learn more about "Seizure: Care Instructions."  Current as of: May 22, 2016  Content Version: 11.7  ?? 2006-2018 [employer]wise, [employer]. Care instructions adapted under license by [employer] (which disclaims liability or warranty for this information). If you have questions about a medical condition or this instruction, always ask your healthcare professional. [employer], [employer] disclaims any warranty or liability for your use  of this information.
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
## 2017-03-13 ENCOUNTER — Ambulatory Visit: Admit: 2017-03-13 | Discharge: 2017-03-13 | Payer: PRIVATE HEALTH INSURANCE | Primary: Internal Medicine

## 2017-03-13 DIAGNOSIS — G629 Polyneuropathy, unspecified: Secondary | ICD-10-CM

## 2017-03-13 NOTE — Progress Notes (Signed)
[facility]  [facility]  [address], [address]  [place], [place] [id number]  p: [phone]  f: [phone]    Test Date:  03/13/2017    Patient: Meghan [name] DOB: Apr 16, 1946 Physician: Dr.Aralu   Sex: Female Height: 5\' 7"  Ref Phys: Dr.Aralu   ID#: [id number]: [id number] Weight: 176 lbs. Technician: Meghan [name]     Patient Complaints:  b/l le pain and weakness    Medications: See chart      Patient History / Exam: This is a 71 year old right-handed black female who is being evaluated for numbness sensation of the lower extremity, back pain.      NCV & EMG Findings:  All nerve conduction studies (as indicated in the following tables) were within normal limits.  Left vs. Right side comparison data for the Fibular motor nerve indicates abnormal L-R amplitude difference (67.1 %) and abnormal L-R velocity difference (Poplt-B Fib, 24 m/s).  All remaining left vs. right side differences were within normal limits.      All F Wave latencies were within normal limits.  All F Wave left vs. right side latency differences were within normal limits.      All examined muscles (as indicated in the following table) showed no evidence of electrical instability.      Impression: There is no electromyographic evidence of peripheral neuropathy.  However, lumbar radiculopathy and small fiber neuropathy cannot be excluded.      Recommendations: Neuroimaging of the lumbosacral spine suggested      ___________________________  Dr.Aralu        Nerve Conduction Studies  Anti Sensory Summary Table     Stim Site NR Peak (ms) Norm Peak (ms) P-T Amp (??V) Norm P-T Amp Site1 Site2 Delta-P (ms) Dist (cm) Vel (m/s) Norm Vel (m/s)   Left Sup Fibular Anti Sensory (Ant Lat Mall)  28.3??C   14 cm    2.3 <4.4 7.0 >5.0 14 cm Ant Lat Mall 2.3 14.0 61 >32   Right Sup Fibular Anti Sensory (Ant Lat Mall)  31??C   14 cm    2.1 <4.4 10.8 >5.0 14 cm Ant Lat Mall 2.1 14.0 67 >32   Left Sural Anti Sensory (Lat Mall)  28.9??C    Calf    3.3 <4.0 19.0 >5.0 Calf Lat Mall 3.3 14.0 42 >35   Right Sural Anti Sensory (Lat Mall)  30.9??C   Calf    2.9 <4.0 32.0 >5.0 Calf Lat Mall 2.9 14.0 48 >35     Motor Summary Table     Stim Site NR Onset (ms) Norm Onset (ms) O-P Amp (mV) Norm O-P Amp Site1 Site2 Delta-0 (ms) Dist (cm) Vel (m/s) Norm Vel (m/s)   Left Fibular Motor (Ext Dig Brev)  30.7??C   Ankle    3.8 <6.1 7.6 >2.5 B Fib Ankle 6.9 33.0 48 >38   B Fib    10.7  6.4  Poplt B Fib 1.9 10.0 53 >40   Poplt    12.6  6.4          Right Fibular Motor (Ext Dig Brev)  32.3??C   Ankle    4.9 <6.1 2.5 >2.5 B Fib Ankle 7.9 32.0 41 >38   B Fib    12.8  1.9  Poplt B Fib 1.3 10.0 77 >40   Poplt    14.1  2.1          Left Tibial Motor (Abd Hall Brev)  28.7??C   Ankle  4.6 <6.1 12.3 >3.0 Knee Ankle 8.4 31.0 37 >35   Knee    13.0  10.6          Right Tibial Motor (Abd Hall Brev)  32??C   Ankle    4.5 <6.1 9.9 >3.0 Knee Ankle 8.9 32.0 36 >35   Knee    13.4  6.3            F Wave Studies     NR F-Lat (ms) Lat Norm (ms) L-R F-Lat (ms) L-R Lat Norm   Left Tibial (Abd Hallucis)  30.3??C      44.64 <61 1.05 <5.7   Right Tibial (Abd Hallucis)  31.4??C      45.69 <61 1.05 <5.7     EMG+     Side Muscle Nerve Root Ins Act Fibs Psw Amp Dur Poly Recrt Int Meghan BiblePat Comment   Right VastusMed Femoral L2-4 Nml Nml Nml Nml Nml 0 Nml Nml    Right VastusLat Femoral L2-4 Nml Nml Nml Nml Nml 0 Nml Nml    Right QuadratusFem QuadFemoris L4-5, S1 Nml Nml Nml Nml Nml 0 Nml Nml    Right AntTibialis Dp Br Fibular L4-5 Nml Nml Nml Nml Nml 0 Nml Nml    Right Fibularis Long Sup Br Fibular L5-S1 Nml Nml Nml Nml Nml 0 Nml Nml        Nerve Conduction Studies  Anti Sensory Left/Right Comparison     Stim Site L Lat (ms) R Lat (ms) L-R Lat (ms) L Amp (??V) R Amp (??V) L-R Amp (%) Site1 Site2 L Vel (m/s) R Vel (m/s) L-R Vel (m/s)   Sup Fibular Anti Sensory (Ant Lat Mall)  28.3??C   14 cm 2.3 2.1 0.2 7.0 10.8 35.2 14 cm Ant Lat Mall 61 67 6   Sural Anti Sensory (Lat Mall)  28.9??C    Calf 3.3 2.9 0.4 19.0 32.0 40.6 Calf Lat Mall 42 48 6     Motor Left/Right Comparison     Stim Site L Lat (ms) R Lat (ms) L-R Lat (ms) L Amp (mV) R Amp (mV) L-R Amp (%) Site1 Site2 L Vel (m/s) R Vel (m/s) L-R Vel (m/s)   Fibular Motor (Ext Dig Brev)  30.7??C   Ankle 3.8 4.9 1.1 7.6 2.5 67.1 B Fib Ankle 48 41 7   B Fib 10.7 12.8 2.1 6.4 1.9 70.3 Poplt B Fib 53 77 24   Poplt 12.6 14.1 1.5 6.4 2.1 67.2        Tibial Motor (Abd Hall Brev)  28.7??C   Ankle 4.6 4.5 0.1 12.3 9.9 19.5 Knee Ankle 37 36 1   Knee 13.0 13.4 0.4 10.6 6.3 40.6              Waveforms:

## 2017-03-20 ENCOUNTER — Encounter: Primary: Internal Medicine

## 2017-03-22 ENCOUNTER — Emergency Department: Admit: 2017-03-23 | Payer: MEDICARE | Primary: Internal Medicine

## 2017-03-22 ENCOUNTER — Inpatient Hospital Stay

## 2017-03-22 DIAGNOSIS — R55 Syncope and collapse: Principal | ICD-10-CM
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
## 2017-03-22 NOTE — ED Notes (Signed)
Pt ambulated to the bathroom with steady gait.

## 2017-03-23 ENCOUNTER — Inpatient Hospital Stay
Admit: 2017-03-23 | Discharge: 2017-03-26 | Disposition: A | Discharge status: Home Health | Payer: MEDICARE | Attending: Internal Medicine | Admitting: Internal Medicine

## 2017-03-23 LAB — URINALYSIS W/ REFLEX CULTURE
Bacteria: NEGATIVE /hpf
Bilirubin: NEGATIVE
Blood: NEGATIVE
Glucose: NEGATIVE mg/dL
Nitrites: NEGATIVE
Protein: NEGATIVE mg/dL
Specific gravity: 1.016 (ref 1.003–1.030)
Urobilinogen: 0.2 EU/dL (ref 0.2–1.0)
pH (UA): 5.5 (ref 5.0–8.0)

## 2017-03-23 LAB — CBC WITH AUTOMATED DIFF
ABS. BASOPHILS: 0.1 10*3/uL (ref 0.0–0.1)
ABS. EOSINOPHILS: 0.2 10*3/uL (ref 0.0–0.4)
ABS. IMM. GRANS.: 0 10*3/uL (ref 0.00–0.04)
ABS. LYMPHOCYTES: 2.4 10*3/uL (ref 0.8–3.5)
ABS. MONOCYTES: 1.1 10*3/uL — ABNORMAL HIGH (ref 0.0–1.0)
ABS. NEUTROPHILS: 4.3 10*3/uL (ref 1.8–8.0)
ABSOLUTE NRBC: 0 10*3/uL (ref 0.00–0.01)
BASOPHILS: 1 % (ref 0–1)
EOSINOPHILS: 2 % (ref 0–7)
HCT: 36.1 % (ref 35.0–47.0)
HGB: 12 g/dL (ref 11.5–16.0)
IMMATURE GRANULOCYTES: 0 % (ref 0.0–0.5)
LYMPHOCYTES: 29 % (ref 12–49)
MCH: 30.5 PG (ref 26.0–34.0)
MCHC: 33.2 g/dL (ref 30.0–36.5)
MCV: 91.9 FL (ref 80.0–99.0)
MONOCYTES: 13 % (ref 5–13)
MPV: 10 FL (ref 8.9–12.9)
NEUTROPHILS: 55 % (ref 32–75)
NRBC: 0 PER 100 WBC
PLATELET: 419 10*3/uL — ABNORMAL HIGH (ref 150–400)
RBC: 3.93 M/uL (ref 3.80–5.20)
RDW: 13.8 % (ref 11.5–14.5)
WBC: 8.1 10*3/uL (ref 3.6–11.0)

## 2017-03-23 LAB — METABOLIC PANEL, COMPREHENSIVE
A-G Ratio: 1 — ABNORMAL LOW (ref 1.1–2.2)
ALT (SGPT): 36 U/L (ref 12–78)
AST (SGOT): 69 U/L — ABNORMAL HIGH (ref 15–37)
Albumin: 3.7 g/dL (ref 3.5–5.0)
Alk. phosphatase: 61 U/L (ref 45–117)
Anion gap: 11 mmol/L (ref 5–15)
BUN/Creatinine ratio: 11 — ABNORMAL LOW (ref 12–20)
BUN: 12 MG/DL (ref 6–20)
Bilirubin, total: 0.2 MG/DL (ref 0.2–1.0)
CO2: 25 mmol/L (ref 21–32)
Calcium: 9 MG/DL (ref 8.5–10.1)
Chloride: 103 mmol/L (ref 97–108)
Creatinine: 1.13 MG/DL — ABNORMAL HIGH (ref 0.55–1.02)
GFR est AA: 58 mL/min/{1.73_m2} — ABNORMAL LOW (ref >60)
GFR est non-AA: 48 mL/min/{1.73_m2} — ABNORMAL LOW (ref >60)
Globulin: 3.6 g/dL (ref 2.0–4.0)
Glucose: 123 mg/dL — ABNORMAL HIGH (ref 65–100)
Potassium: 3.6 mmol/L (ref 3.5–5.1)
Protein, total: 7.3 g/dL (ref 6.4–8.2)
Sodium: 139 mmol/L (ref 136–145)

## 2017-03-23 LAB — GLUCOSE, POC
Glucose (POC): 137 mg/dL — ABNORMAL HIGH (ref 65–100)
Glucose (POC): 89 mg/dL (ref 65–100)
Glucose (POC): 91 mg/dL (ref 65–100)
Glucose (POC): 81 mg/dL (ref 65–100)

## 2017-03-23 LAB — EKG, 12 LEAD, INITIAL
Atrial Rate: 91 {beats}/min
Calculated P Axis: 64 degrees
Calculated R Axis: 17 degrees
Calculated T Axis: 35 degrees
Diagnosis: NORMAL
P-R Interval: 130 ms
Q-T Interval: 352 ms
QRS Duration: 70 ms
QTC Calculation (Bezet): 432 ms
Ventricular Rate: 91 {beats}/min

## 2017-03-23 LAB — CK
CK: 2897 U/L — ABNORMAL HIGH (ref 26–192)
CK: 2467 U/L — ABNORMAL HIGH (ref 26–192)

## 2017-03-23 LAB — TROPONIN I: Troponin-I, Qt.: <0.05 ng/mL (ref <0.05)

## 2017-03-23 LAB — VITAMIN B12: Vitamin B12: 609 pg/mL (ref 193–986)

## 2017-03-23 LAB — FOLATE: Folate: 20.4 ng/mL (ref 5.0–21.0)

## 2017-03-23 LAB — EKG 12-LEAD
Atrial Rate: 91 {beats}/min
Diagnosis: NORMAL
P Axis: 64 degrees
P-R Interval: 130 ms
Q-T Interval: 352 ms
QRS Duration: 70 ms
QTc Calculation (Bazett): 432 ms
R Axis: 17 degrees
T Axis: 35 degrees
Ventricular Rate: 91 {beats}/min

## 2017-03-23 LAB — ECHOCARDIOGRAM COMPLETE 2D W DOPPLER W COLOR: Left Ventricular Ejection Fraction: 58

## 2017-03-23 MED ORDER — METFORMIN 500 MG TAB
500 mg | Freq: Two times a day (BID) | ORAL | Status: DC
Start: 2017-03-23 — End: 2017-03-23
  Administered 2017-03-23: 13:00:00 via ORAL

## 2017-03-23 MED ORDER — CHOLECALCIFEROL (VITAMIN D3) 1,000 UNIT (25 MCG) TAB
Freq: Every day | ORAL | Status: DC
Start: 2017-03-23 — End: 2017-03-26
  Administered 2017-03-23 – 2017-03-26 (×4): via ORAL

## 2017-03-23 MED ORDER — ASPIRIN 81 MG CHEWABLE TAB
81 mg | Freq: Every day | ORAL | Status: DC
Start: 2017-03-23 — End: 2017-03-26
  Administered 2017-03-23 – 2017-03-26 (×4): via ORAL

## 2017-03-23 MED ORDER — ATORVASTATIN 10 MG TAB
10 mg | Freq: Every day | ORAL | Status: DC
Start: 2017-03-23 — End: 2017-03-23
  Administered 2017-03-23: 13:00:00 via ORAL

## 2017-03-23 MED ORDER — METHYLPHENIDATE 5 MG TAB
5 mg | Freq: Every day | ORAL | Status: DC
Start: 2017-03-23 — End: 2017-03-26
  Administered 2017-03-23 – 2017-03-26 (×4): via ORAL

## 2017-03-23 MED ORDER — GLUCOSE 4 GRAM CHEWABLE TAB
4 gram | ORAL | Status: DC | PRN
Start: 2017-03-23 — End: 2017-03-26

## 2017-03-23 MED ORDER — LACOSAMIDE 50 MG TAB
50 mg | Freq: Two times a day (BID) | ORAL | Status: DC
Start: 2017-03-23 — End: 2017-03-26
  Administered 2017-03-23 – 2017-03-26 (×7): via ORAL

## 2017-03-23 MED ORDER — ENOXAPARIN 40 MG/0.4 ML SUB-Q SYRINGE
40 mg/0.4 mL | SUBCUTANEOUS | Status: DC
Start: 2017-03-23 — End: 2017-03-26
  Administered 2017-03-23 – 2017-03-26 (×4): via SUBCUTANEOUS

## 2017-03-23 MED ORDER — SODIUM CHLORIDE 0.9 % IJ SYRG
INTRAMUSCULAR | Status: DC | PRN
Start: 2017-03-23 — End: 2017-03-26
  Administered 2017-03-24: 20:00:00 via INTRAVENOUS

## 2017-03-23 MED ORDER — CITALOPRAM 20 MG TAB
20 mg | Freq: Every day | ORAL | Status: DC
Start: 2017-03-23 — End: 2017-03-26
  Administered 2017-03-23 – 2017-03-26 (×4): via ORAL

## 2017-03-23 MED ORDER — LISINOPRIL 5 MG TAB
5 mg | Freq: Every day | ORAL | Status: DC
Start: 2017-03-23 — End: 2017-03-26
  Administered 2017-03-23 – 2017-03-26 (×4): via ORAL

## 2017-03-23 MED ORDER — LORAZEPAM 2 MG/ML IJ SOLN
2 mg/mL | INTRAMUSCULAR | Status: DC
Start: 2017-03-23 — End: 2017-03-23
  Administered 2017-03-23: 13:00:00 via INTRAVENOUS

## 2017-03-23 MED ORDER — DIVALPROEX 250 MG TAB, DELAYED RELEASE
250 mg | Freq: Two times a day (BID) | ORAL | Status: DC
Start: 2017-03-23 — End: 2017-03-26
  Administered 2017-03-23 – 2017-03-26 (×7): via ORAL

## 2017-03-23 MED ORDER — GLUCAGON 1 MG INJECTION
1 mg | INTRAMUSCULAR | Status: DC | PRN
Start: 2017-03-23 — End: 2017-03-26

## 2017-03-23 MED ORDER — LORAZEPAM 2 MG/ML IJ SOLN
2 mg/mL | Freq: Once | INTRAMUSCULAR | Status: AC | PRN
Start: 2017-03-23 — End: 2017-03-24

## 2017-03-23 MED ORDER — SODIUM CHLORIDE 0.9 % IV
Freq: Once | INTRAVENOUS | Status: AC
Start: 2017-03-23 — End: 2017-03-23
  Administered 2017-03-23: 03:00:00 via INTRAVENOUS

## 2017-03-23 MED ORDER — DEXTROSE 50% IN WATER (D50W) IV SYRG
INTRAVENOUS | Status: DC | PRN
Start: 2017-03-23 — End: 2017-03-26

## 2017-03-23 MED ORDER — INSULIN LISPRO 100 UNIT/ML INJECTION
100 unit/mL | Freq: Four times a day (QID) | SUBCUTANEOUS | Status: DC
Start: 2017-03-23 — End: 2017-03-26
  Administered 2017-03-25: 02:00:00 via SUBCUTANEOUS

## 2017-03-23 MED ORDER — SODIUM CHLORIDE 0.9 % IV
INTRAVENOUS | Status: DC
Start: 2017-03-23 — End: 2017-03-26
  Administered 2017-03-23 – 2017-03-26 (×9): via INTRAVENOUS

## 2017-03-23 MED ORDER — SODIUM CHLORIDE 0.9 % IJ SYRG
Freq: Three times a day (TID) | INTRAMUSCULAR | Status: DC
Start: 2017-03-23 — End: 2017-03-26
  Administered 2017-03-23 – 2017-03-26 (×10): via INTRAVENOUS

## 2017-03-23 MED FILL — DEPAKOTE 250 MG TABLET,DELAYED RELEASE: 250 mg | ORAL | Qty: 2

## 2017-03-23 MED FILL — VIMPAT 50 MG TABLET: 50 mg | ORAL | Qty: 3

## 2017-03-23 MED FILL — ATORVASTATIN 10 MG TAB: 10 mg | ORAL | Qty: 1

## 2017-03-23 MED FILL — VITAMIN D3 25 MCG (1,000 UNIT) TABLET: 25 mcg (1,000 unit) | ORAL | Qty: 1

## 2017-03-23 MED FILL — BD POSIFLUSH NORMAL SALINE 0.9 % INJECTION SYRINGE: INTRAMUSCULAR | Qty: 10

## 2017-03-23 MED FILL — METHYLPHENIDATE 5 MG TAB: 5 mg | ORAL | Qty: 1

## 2017-03-23 MED FILL — ENOXAPARIN 40 MG/0.4 ML SUB-Q SYRINGE: 40 mg/0.4 mL | SUBCUTANEOUS | Qty: 0.4

## 2017-03-23 MED FILL — METFORMIN 500 MG TAB: 500 mg | ORAL | Qty: 1

## 2017-03-23 MED FILL — CITALOPRAM 20 MG TAB: 20 mg | ORAL | Qty: 1

## 2017-03-23 MED FILL — LISINOPRIL 5 MG TAB: 5 mg | ORAL | Qty: 1

## 2017-03-23 MED FILL — SODIUM CHLORIDE 0.9 % IV: INTRAVENOUS | Qty: 1000

## 2017-03-23 MED FILL — CHILDREN'S ASPIRIN 81 MG CHEWABLE TABLET: 81 mg | ORAL | Qty: 1

## 2017-03-23 NOTE — Progress Notes (Addendum)
Hospitalist Progress Note    NAME: Meghan FuchsBarbara J [name]   DOB:  February 04, 1946   MRN:  [id number]     Assessment / Plan:  Rhabdomyolysis POA  Due to been on the floor vs seizure  increase IVF to 16600ml/hr  Hold statins, may be resume down the road as an OP and trend CK  Monitor CK and lytes    Syncope, ? Pior or post fall as mixed history  R/o Seizure, r/o cardiac etiology  Awaiting EEG  Check 2D echo and carotid dopplers  Awaiting neurology input  Check TSH    Seizure disorder  Continue Valproic Acid and Vimpat    DMII  Hold metformin and continue SSI    HTN  Continue ACE inh    Hyperlipidemia  Holding statins due to rhabdo    Depression/Anxiety  Continue psych meds  ??  Code Status: Full  Surrogate Decision Maker: Daughter  DVT Prophylaxis: Lovenox  GI Prophylaxis: not indicated  Baseline: Independent. Has moved down from [place] recently.                           Subjective: Pt seen and examined at bedside. NAD. Feels the same. Overnight events d/w RN   CHIEF COMPLAINT: f/u "Fall"  ??  Review of Systems:  Symptom Y/N Comments  Symptom Y/N Comments   Fever/Chills n   Chest Pain n    Poor Appetite    Edema     Cough n   Abdominal Pain n    Sputum    Joint Pain     SOB/DOE    Pruritis/Rash     Nausea/vomit    Tolerating PT/OT     Diarrhea    Tolerating Diet y    Constipation    Other       Could NOT obtain due to:      Objective:     VITALS:   Last 24hrs VS reviewed since prior progress note. Most recent are:  Patient Vitals for the past 24 hrs:   Temp Pulse Resp BP SpO2   03/23/17 0756 98.5 ??F (36.9 ??C) 83 18 125/67 97 %   03/23/17 0250 99 ??F (37.2 ??C) 91 18 141/75 98 %   03/23/17 0120 98 ??F (36.7 ??C) 95 18 160/83 97 %   03/23/17 0100 98.4 ??F (36.9 ??C) 94 17 (!) 170/92 97 %   03/22/17 1950 98.5 ??F (36.9 ??C) (!) 105 16 (!) 175/98 98 %     No intake or output data in the 24 hours ending 03/23/17 0943     PHYSICAL EXAM:  General: WD, WN. Alert, cooperative, no acute distress????  EENT:  EOMI. Anicteric sclerae. MMM   Resp:  CTA bilaterally, no wheezing or rales.  No accessory muscle use  CV:  Regular  rhythm,?? No edema  GI:  Soft, Non distended, Non tender. ??+Bowel sounds  Neurologic:?? Alert and oriented X 3, normal speech,   Psych:???? Fair insight.??Not anxious nor agitated  Skin:  No rashes.  No jaundice    Reviewed most current lab test results and cultures  YES  Reviewed most current radiology test results   YES  Review and summation of old records today    NO  Reviewed patient's current orders and MAR    YES  PMH/SH reviewed - no change compared to H&P  ________________________________________________________________________  Care Plan discussed with:    Comments   Patient y    Family  RN y    Buyer, retail                        Multidiciplinary team rounds were held today with case manager, nursing, pharmacist and Higher education careers adviser.  Patient's plan of care was discussed; medications were reviewed and discharge planning was addressed.     ________________________________________________________________________  Total NON critical care TIME:  35  Minutes    Total CRITICAL CARE TIME Spent:   Minutes non procedure based      Comments   >50% of visit spent in counseling and coordination of care     ________________________________________________________________________  Jamesetta So, MD     Procedures: see electronic medical records for all procedures/Xrays and details which were not copied into this note but were reviewed prior to creation of Plan.      LABS:  I reviewed today's most current labs and imaging studies.  Pertinent labs include:  Recent Labs      03/22/17   2021   WBC  8.1   HGB  12.0   HCT  36.1   PLT  419*     Recent Labs      03/22/17   2021   NA  139   K  3.6   CL  103   CO2  25   GLU  123*   BUN  12   CREA  1.13*   CA  9.0   ALB  3.7   TBILI  0.2   SGOT  69*   ALT  36       Signed: Jamesetta So, MD
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
## 2017-03-23 NOTE — Progress Notes (Signed)
Obtained MRI checklist but I am not comfortable sending this to MRI due to pt's short term memory problem. I tried to call her daughter but her phone is disconnected. I will wait for the family .

## 2017-03-23 NOTE — ED Notes (Signed)
TRANSFER - OUT REPORT:    Verbal report given to Sandi CarneMeg Green, RN(name) on Meghan FuchsBarbara J [name]  being transferred to 3113(unit) for routine progression of care       Report consisted of patient???s Situation, Background, Assessment and   Recommendations(SBAR).     Information from the following report(s) ED Summary was reviewed with the receiving nurse.    Opportunity for questions and clarification was provided.      Patient transported with:   [employer]

## 2017-03-23 NOTE — Progress Notes (Signed)
Able to obtained consent for MRI from her sister Leanor Rubensteinrnistine Whiter ,phone number( [phone].
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
## 2017-03-23 NOTE — Progress Notes (Addendum)
Pt is a 71 y.o african [place] female admitted with Syncope. Pt was alert, in no distress sitting in the chair. Demographic information verified and some updates are needed. Pt states she has Medicaid in addition to Medicare. Pt lives alone in a 2 story town home with steps to the entrance. Prior to admission, pt was independent with her ADL's and IADL's. Pt drives and doesn't have a car. Pt's sister assist with driving pt to the grocery store, medical appts and can transport pt home at discharge. Denies using DME or having home health and rehab. Preferred pharmacy is [facility]. Pt stated she had assistance 2 yrs ago with personal care aides. CM discussed home health and pt was in agreement. [facility]scripts and they accepted. FOC form completed. CM discussed [facility] with pt and she was in agreement. CM will send referral via allscripts to [facility]. CM spoke with pt's sister by phone [phone]) and she will drive pt home at discharge. CM will continue to follow pt for discharge planning needs.     Reason for Admission:   Syncope                   RRAT Score:          8           Plan for utilizing home health:      Yes                     Likelihood of Readmission:   low                         Transition of Care Plan:             Home with home health and f/u appts    Care Management Interventions  PCP Verified by CM: Yes (Dr. Gweneth FritterSeabrook, NP)  Mode of Transport at Discharge: Other (see comment) (pt's sister can transport by car)  Transition of Care Consult (CM Consult): Discharge Planning  Discharge Durable Medical Equipment: No  Physical Therapy Consult: Yes  Occupational Therapy Consult: Yes  Speech Therapy Consult: No  Current Support Network: Lives Alone, Own Home (lives in a 2 story townhome with steps)  Confirm Follow Up Transport: Family  Plan discussed with Pt/Family/Caregiver: Yes  Discharge Location  Discharge Placement: Home with home health     Caleen JobsChristine M BigelowBreidenbaugh, RN  [phone]

## 2017-03-23 NOTE — Progress Notes (Signed)
2 D Echo Completed

## 2017-03-23 NOTE — Other (Signed)
Called for report.  No response either to pod phone [phone] or charge phone [phone].  I may be contacted on [phone] for report

## 2017-03-23 NOTE — Progress Notes (Signed)
PT note:    Orders received and acknowledged. Chart reviewed and spoke with nursing. Patient currently receiving bedside EEG. Will follow up for PT evaluation.     Tiffany R Sheffer, PT, DPT
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
## 2017-03-23 NOTE — Procedures (Signed)
[facility]  *** FINAL REPORT ***    Name: Meghan [name], Meghan [name]  MRN: [id number]    Inpatient  DOB: 03 May 1946  HIS Order #: [id number]  TRAKnet Visit #: [id number]  Date: 23 Mar 2017    TYPE OF TEST: Cerebrovascular Duplex    REASON FOR TEST  Syncope    Right Carotid:-             Proximal               Mid                 Distal  cm/s  Systolic  Diastolic  Systolic  Diastolic  Systolic  Diastolic  CCA:     08.696.0      18.0                            73.0      16.0  Bulb:  ECA:     38.0       5.0  ICA:     58.0      11.0       41.0      12.0       52.0      13.0  ICA/CCA:  0.8       0.7    ICA Stenosis: <50%    Right Vertebral:-  Finding: Antegrade  Sys:       40.0  Dia:       11.0    Right Subclavian: Normal    Left Carotid:-            Proximal                Mid                 Distal  cm/s  Systolic  Diastolic  Systolic  Diastolic  Systolic  Diastolic  CCA:     57.893.0      19.0                            84.0      17.0  Bulb:  ECA:     44.0      10.0  ICA:     49.0      11.0       54.0      13.0       65.0      19.0  ICA/CCA:  0.6       0.6    ICA Stenosis: <50%    Left Vertebral:-  Finding: Antegrade  Sys:       56.0  Dia:        9.0    Left Subclavian: Normal    INTERPRETATION/FINDINGS  PROCEDURE:  Carotid Duplex Examination using B-mode, color and  spectral Doppler of the extracranial cerebrovascular arteries.    1. Bilateral <50% stenosis of the internal carotid arteries.  2. No significant stenosis in the external carotid arteries  bilaterally.  3. Antegrade flow in both vertebral arteries.  4. Normal flow in both subclavian arteries.  Plaque Morphology:  1. Heterogeneous plaque in the right ICA.  2. Heterogeneous plaque in the left ICA.    ADDITIONAL COMMENTS    I have personally reviewed the data relevant to the interpretation of  this  study.    TECHNOLOGIST: Wilmer FloorLisa A. Perrino, RVT,  RDMS  Signed: 03/23/2017 11:29 AM    PHYSICIAN: Maisie Fus A. Katrinka Blazing, MD  Signed: 03/23/2017 02:10 PM
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
## 2017-03-23 NOTE — Progress Notes (Signed)
Occupational Therapy Note:    Orders received and acknowledged. Chart reviewed and spoke with nursing. Patient currently receiving bedside EEG. Will follow up for OT evaluation. Thank you.    Jaymes GraffSarah Hancock, OTR/L

## 2017-03-23 NOTE — Progress Notes (Signed)
[facility] Vascular Lab Technologist Preliminary Report:  Carotid Duplex Scan    Right:  Mild plaque noted in the right carotid system.  Right ICA velocities suggest less than 50% diameter reduction.  Right vertebral artery flow is antegrade.    Left:  Mild plaque noted in the left carotid system.  Left ICA velocities suggest less than 50% diameter reduction.  Left vertebral artery flow is antegrade.    Final report to follow.

## 2017-03-23 NOTE — Procedures (Signed)
[facility]  *** FINAL REPORT ***    Name: [name], Meghan  MRN: [id number]    Inpatient  DOB: 03 May 1946  HIS Order #: [id number]  TRAKnet Visit #: [id number]  Date: 23 Mar 2017    TYPE OF TEST: Cerebrovascular Duplex    REASON FOR TEST  Syncope    Right Carotid:-             Proximal               Mid                 Distal  cm/s  Systolic  Diastolic  Systolic  Diastolic  Systolic  Diastolic  CCA:     96.0      18.0                            73.0      16.0  Bulb:  ECA:     38.0       5.0  ICA:     58.0      11.0       41.0      12.0       52.0      13.0  ICA/CCA:  0.8       0.7    ICA Stenosis: <50%    Right Vertebral:-  Finding: Antegrade  Sys:       40.0  Dia:       11.0    Right Subclavian: Normal    Left Carotid:-            Proximal                Mid                 Distal  cm/s  Systolic  Diastolic  Systolic  Diastolic  Systolic  Diastolic  CCA:     93.0      19.0                            84.0      17.0  Bulb:  ECA:     44.0      10.0  ICA:     49.0      11.0       54.0      13.0       65.0      19.0  ICA/CCA:  0.6       0.6    ICA Stenosis: <50%    Left Vertebral:-  Finding: Antegrade  Sys:       56.0  Dia:        9.0    Left Subclavian: Normal    INTERPRETATION/FINDINGS  PROCEDURE:  Carotid Duplex Examination using B-mode, color and  spectral Doppler of the extracranial cerebrovascular arteries.    1. Bilateral <50% stenosis of the internal carotid arteries.  2. No significant stenosis in the external carotid arteries  bilaterally.  3. Antegrade flow in both vertebral arteries.  4. Normal flow in both subclavian arteries.  Plaque Morphology:  1. Heterogeneous plaque in the right ICA.  2. Heterogeneous plaque in the left ICA.    ADDITIONAL COMMENTS    I have personally reviewed the data relevant to the interpretation of  this  study.    TECHNOLOGIST: Lisa A. Perrino, RVT,   RDMS  Signed: 03/23/2017 11:29 AM    PHYSICIAN: Undra Harriman A. Vann Okerlund, MD  Signed: 03/23/2017 02:10 PM

## 2017-03-23 NOTE — Procedures (Signed)
[facility]  [facility]    Name:Meghan [name], Meghan [name] LullBARBARA J.  MR#: [id number]  DOB: Aug 04, 1946  ACCOUNT #: [id number]   DATE OF SERVICE: 03/23/2017    CLINICAL INDICATION:  The patient is a 71 year old female with a history of passing out spells.  The patient with falling down.  The patient with seizure disorder.  The patient on medication Vimpat.  EEG to rule out cortical abnormality.  Rule out seizures, rule out encephalopathy.    EEG CLASSIFICATION:  Essentially normal awake and asleep.    DESCRIPTION  OF THE RECORD:  The background of this recording contains a posteriorly located occipital alpha rhythm of 9-10 Hz that attenuates some with eye opening.  Throughout the recording, there are no clear areas of focal slowing.  No spike or spike and wave discharges are seen.  Hyperventilation was not performed.  Photic stimulation produced a mild driving response in the posterior head regions.  During the recording, the patient did enter brief states of sleep with K complexes and sleep spindles in the central head regions.  Hyperventilation was not performed.    INTERPRETATION:  This is a normal electroencephalogram with the patient awake and asleep showing no clear focal abnormalities, no spike discharges, no recorded spells of any type.  Clinical correlation is recommended.      Youlanda RoysHOMAS A. Susano Cleckler, MD       TAS / LN  D: 03/23/2017 21:02     T: 03/24/2017 10:34  JOB #: [id number]  CC: Jettie PaganHOMAS Valincia Touch MD
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
## 2017-03-24 ENCOUNTER — Inpatient Hospital Stay: Admit: 2017-03-24 | Payer: MEDICARE | Primary: Internal Medicine

## 2017-03-24 LAB — TSH 3RD GENERATION: TSH: 0.44 u[IU]/mL (ref 0.36–3.74)

## 2017-03-24 LAB — METABOLIC PANEL, BASIC
Anion gap: 6 mmol/L (ref 5–15)
BUN/Creatinine ratio: 16 (ref 12–20)
BUN: 14 MG/DL (ref 6–20)
CO2: 28 mmol/L (ref 21–32)
Calcium: 8.1 MG/DL — ABNORMAL LOW (ref 8.5–10.1)
Chloride: 109 mmol/L — ABNORMAL HIGH (ref 97–108)
Creatinine: 0.89 MG/DL (ref 0.55–1.02)
GFR est AA: >60 mL/min/{1.73_m2} (ref >60)
GFR est non-AA: >60 mL/min/{1.73_m2} (ref >60)
Glucose: 70 mg/dL (ref 65–100)
Potassium: 4.2 mmol/L (ref 3.5–5.1)
Sodium: 143 mmol/L (ref 136–145)

## 2017-03-24 LAB — GLUCOSE, POC
Glucose (POC): 102 mg/dL — ABNORMAL HIGH (ref 65–100)
Glucose (POC): 64 mg/dL — ABNORMAL LOW (ref 65–100)
Glucose (POC): 70 mg/dL (ref 65–100)
Glucose (POC): 120 mg/dL — ABNORMAL HIGH (ref 65–100)
Glucose (POC): 85 mg/dL (ref 65–100)
Glucose (POC): 75 mg/dL (ref 65–100)
Glucose (POC): 80 mg/dL (ref 65–100)

## 2017-03-24 LAB — VALPROIC ACID: Valproic acid: 79 ug/ml (ref 50–100)

## 2017-03-24 LAB — ANA QL, W/REFLEX CASCADE
ANA DIRECT, 165188: NEGATIVE
ANA Direct: NEGATIVE

## 2017-03-24 LAB — CK: CK: 1203 U/L — ABNORMAL HIGH (ref 26–192)

## 2017-03-24 MED ORDER — LORAZEPAM 2 MG/ML IJ SOLN
2 mg/mL | Freq: Once | INTRAMUSCULAR | Status: AC | PRN
Start: 2017-03-24 — End: 2017-03-24
  Administered 2017-03-24: 20:00:00 via INTRAVENOUS

## 2017-03-24 MED FILL — VIMPAT 50 MG TABLET: 50 mg | ORAL | Qty: 3

## 2017-03-24 MED FILL — LORAZEPAM 2 MG/ML IJ SOLN: 2 mg/mL | INTRAMUSCULAR | Qty: 1

## 2017-03-24 MED FILL — CITALOPRAM 20 MG TAB: 20 mg | ORAL | Qty: 1

## 2017-03-24 MED FILL — BD POSIFLUSH NORMAL SALINE 0.9 % INJECTION SYRINGE: INTRAMUSCULAR | Qty: 10

## 2017-03-24 MED FILL — DEPAKOTE 250 MG TABLET,DELAYED RELEASE: 250 mg | ORAL | Qty: 2

## 2017-03-24 MED FILL — METHYLPHENIDATE 5 MG TAB: 5 mg | ORAL | Qty: 1

## 2017-03-24 MED FILL — ENOXAPARIN 40 MG/0.4 ML SUB-Q SYRINGE: 40 mg/0.4 mL | SUBCUTANEOUS | Qty: 0.4

## 2017-03-24 MED FILL — LISINOPRIL 5 MG TAB: 5 mg | ORAL | Qty: 1

## 2017-03-24 MED FILL — VITAMIN D3 25 MCG (1,000 UNIT) TABLET: 25 mcg (1,000 unit) | ORAL | Qty: 1

## 2017-03-24 MED FILL — CHILDREN'S ASPIRIN 81 MG CHEWABLE TABLET: 81 mg | ORAL | Qty: 1
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
## 2017-03-24 NOTE — Progress Notes (Signed)
Hospitalist Progress Note    NAME: Meghan [name]   DOB:  February 16, 1946   MRN:  [id number]     Assessment / Plan:  Rhabdomyolysis POA  Due to been on the floor vs seizure  Continue IVF  Hold statins, may be resume down the road as an OP and trend CK  Continue to monitor CK, trending down    Syncope, ? Pior or post fall as mixed history  R/o Seizure, r/o cardiac etiology  Awaiting EEG  2D echo with normal EF  Carotid dopplers without significant stenosis  Neurology is on the case  Awaiting MRI brain  TSH ok    Seizure disorder  Continue Valproic Acid and Vimpat    DMII  Hold metformin and continue SSI    HTN  Continue ACE inh    Hyperlipidemia  Holding statins due to rhabdo    Depression/Anxiety  Continue psych meds  ??  Code Status: Full  Surrogate Decision Maker: Daughter  DVT Prophylaxis: Lovenox  GI Prophylaxis: not indicated  Baseline: Independent. Has moved down from [place] recently.                           Subjective: Pt seen and examined at bedside. No new complaints. Feels the same. Overnight events d/w RN   CHIEF COMPLAINT: f/u "Fall"  ??  Review of Systems:  Symptom Y/N Comments  Symptom Y/N Comments   Fever/Chills n   Chest Pain n    Poor Appetite    Edema     Cough n   Abdominal Pain n    Sputum    Joint Pain     SOB/DOE    Pruritis/Rash     Nausea/vomit    Tolerating PT/OT     Diarrhea    Tolerating Diet y    Constipation    Other       Could NOT obtain due to:      Objective:     VITALS:   Last 24hrs VS reviewed since prior progress note. Most recent are:  Patient Vitals for the past 24 hrs:   Temp Pulse Resp BP SpO2   03/24/17 1112 98.9 ??F (37.2 ??C) 78 18 120/70 99 %   03/24/17 0754 98.2 ??F (36.8 ??C) 76 18 135/81 96 %   03/24/17 0252 98.2 ??F (36.8 ??C) 82 18 147/85 100 %   03/23/17 2335 98.4 ??F (36.9 ??C) 87 18 140/77 99 %   03/23/17 2000 99 ??F (37.2 ??C) 87 18 130/77 99 %   03/23/17 1506 98.5 ??F (36.9 ??C) 82 18 122/67 99 %     No intake or output data in the 24 hours ending 03/24/17 1434      PHYSICAL EXAM:  General: WD, WN. Alert, cooperative, no acute distress????  EENT:  EOMI. Anicteric sclerae. MMM  Resp:  CTA bilaterally, no wheezing or rales.  No accessory muscle use  CV:  Regular  rhythm,?? No edema  GI:  Soft, Non distended, Non tender. ??+Bowel sounds  Neurologic:?? Alert and oriented X 3, normal speech,   Psych:???? Fair insight.??Not anxious nor agitated  Skin:  No rashes.  No jaundice    Reviewed most current lab test results and cultures  YES  Reviewed most current radiology test results   YES  Review and summation of old records today    NO  Reviewed patient's current orders and MAR    YES  PMH/SH reviewed - no  change compared to H&P  ________________________________________________________________________  Care Plan discussed with:    Comments   Patient y    Family      RN y    Buyer, retailCare Manager     Consultant                        Multidiciplinary team rounds were held today with case manager, nursing, pharmacist and Higher education careers adviserclinical coordinator.  Patient's plan of care was discussed; medications were reviewed and discharge planning was addressed.     ________________________________________________________________________  Total NON critical care TIME:  25  Minutes    Total CRITICAL CARE TIME Spent:   Minutes non procedure based      Comments   >50% of visit spent in counseling and coordination of care     ________________________________________________________________________  Jamesetta SoMushtaq Kiaira Pointer, MD     Procedures: see electronic medical records for all procedures/Xrays and details which were not copied into this note but were reviewed prior to creation of Plan.      LABS:  I reviewed today's most current labs and imaging studies.  Pertinent labs include:  Recent Labs      03/22/17   2021   WBC  8.1   HGB  12.0   HCT  36.1   PLT  419*     Recent Labs      03/24/17   0512  03/22/17   2021   NA  143  139   K  4.2  3.6   CL  109*  103   CO2  28  25   GLU  70  123*   BUN  14  12   CREA  0.89  1.13*    CA  8.1*  9.0   ALB   --   3.7   TBILI   --   0.2   SGOT   --   69*   ALT   --   36       Signed: Jamesetta SoMushtaq Tanvir Hipple, MD

## 2017-03-24 NOTE — Procedures (Signed)
[facility]  EEG    Name:Meghan [name], Wynelle J.  MR#: [id number]  DOB: 04/30/1946  ACCOUNT #: [id number]   DATE OF SERVICE: 03/23/2017    CLINICAL INDICATION:  The patient is a 70-year-old female with a history of passing out spells.  The patient with falling down.  The patient with seizure disorder.  The patient on medication Vimpat.  EEG to rule out cortical abnormality.  Rule out seizures, rule out encephalopathy.    EEG CLASSIFICATION:  Essentially normal awake and asleep.    DESCRIPTION  OF THE RECORD:  The background of this recording contains a posteriorly located occipital alpha rhythm of 9-10 Hz that attenuates some with eye opening.  Throughout the recording, there are no clear areas of focal slowing.  No spike or spike and wave discharges are seen.  Hyperventilation was not performed.  Photic stimulation produced a mild driving response in the posterior head regions.  During the recording, the patient did enter brief states of sleep with K complexes and sleep spindles in the central head regions.  Hyperventilation was not performed.    INTERPRETATION:  This is a normal electroencephalogram with the patient awake and asleep showing no clear focal abnormalities, no spike discharges, no recorded spells of any type.  Clinical correlation is recommended.      Yvett Rossel A. Else Habermann, MD       TAS / LN  D: 03/23/2017 21:02     T: 03/24/2017 10:34  JOB #: [id number]  CC: Eldra Word MD

## 2017-03-24 NOTE — Progress Notes (Signed)
CM met with Pt to discuss D/C planning. Pt has Success set up upon D/C, however PT recommending 24/7 supervision. CM spoke with Pt daughter Verdene Lennert [phone] who reported she would be able to move in with Pt to assist with supervision. Anticipate Pt D/C tomorrow.    Wende Neighbors, LCSW

## 2017-03-24 NOTE — Progress Notes (Signed)
Blood sugar has been dropping today,Dr. Alanson AlyAnis made aware with orders made.

## 2017-03-25 LAB — CK: CK: 757 U/L — ABNORMAL HIGH (ref 26–192)

## 2017-03-25 LAB — CULTURE, URINE
Colonies Counted: >100000
Colony Count: >100000

## 2017-03-25 LAB — GLUCOSE, POC
Glucose (POC): 88 mg/dL (ref 65–100)
Glucose (POC): 125 mg/dL — ABNORMAL HIGH (ref 65–100)
Glucose (POC): 77 mg/dL (ref 65–100)
Glucose (POC): 78 mg/dL (ref 65–100)
Glucose (POC): 70 mg/dL (ref 65–100)
Glucose (POC): 129 mg/dL — ABNORMAL HIGH (ref 65–100)
Glucose (POC): 76 mg/dL (ref 65–100)
Glucose (POC): 87 mg/dL (ref 65–100)
Glucose (POC): 121 mg/dL — ABNORMAL HIGH (ref 65–100)

## 2017-03-25 LAB — METABOLIC PANEL, BASIC
Anion gap: 6 mmol/L (ref 5–15)
BUN/Creatinine ratio: 17 (ref 12–20)
BUN: 14 MG/DL (ref 6–20)
CO2: 28 mmol/L (ref 21–32)
Calcium: 8.2 MG/DL — ABNORMAL LOW (ref 8.5–10.1)
Chloride: 107 mmol/L (ref 97–108)
Creatinine: 0.83 MG/DL (ref 0.55–1.02)
GFR est AA: >60 mL/min/{1.73_m2} (ref >60)
GFR est non-AA: >60 mL/min/{1.73_m2} (ref >60)
Glucose: 63 mg/dL — ABNORMAL LOW (ref 65–100)
Potassium: 4.2 mmol/L (ref 3.5–5.1)
Sodium: 141 mmol/L (ref 136–145)

## 2017-03-25 LAB — VALPROIC ACID: Valproic acid: 77 ug/ml (ref 50–100)

## 2017-03-25 LAB — CORTISOL, AM: Cortisol, a.m.: 5.8 ug/dL (ref 4.3–22.4)

## 2017-03-25 MED FILL — DEPAKOTE 250 MG TABLET,DELAYED RELEASE: 250 mg | ORAL | Qty: 2

## 2017-03-25 MED FILL — CITALOPRAM 20 MG TAB: 20 mg | ORAL | Qty: 1

## 2017-03-25 MED FILL — CHILDREN'S ASPIRIN 81 MG CHEWABLE TABLET: 81 mg | ORAL | Qty: 1

## 2017-03-25 MED FILL — VITAMIN D3 25 MCG (1,000 UNIT) TABLET: 25 mcg (1,000 unit) | ORAL | Qty: 1

## 2017-03-25 MED FILL — METHYLPHENIDATE 5 MG TAB: 5 mg | ORAL | Qty: 1

## 2017-03-25 MED FILL — VIMPAT 50 MG TABLET: 50 mg | ORAL | Qty: 3

## 2017-03-25 MED FILL — ENOXAPARIN 40 MG/0.4 ML SUB-Q SYRINGE: 40 mg/0.4 mL | SUBCUTANEOUS | Qty: 0.4

## 2017-03-25 MED FILL — BD POSIFLUSH NORMAL SALINE 0.9 % INJECTION SYRINGE: INTRAMUSCULAR | Qty: 10

## 2017-03-25 MED FILL — LISINOPRIL 5 MG TAB: 5 mg | ORAL | Qty: 1

## 2017-03-25 NOTE — Progress Notes (Signed)
Hospitalist Progress Note    NAME: Meghan [name]   DOB:  [birth year]   MRN:  [id number]     Assessment / Plan:  Rhabdomyolysis POA  Due to been on the floor vs seizure  Continue IVF  Holding statins, may be resume down the road as an OP and trend CK  Continue to monitor CK, trending down    Syncope, ? Pior or post fall as mixed history  Neurology doesn't believe this to be a seizure  2D echo with normal EF  Carotid dopplers without significant stenosis  Neurology is on the case  MRI brain with acute changes  TSH ok    Seizure disorder  Continue Valproic Acid and Vimpat    DMII  Hold metformin and continue SSI  Check A1C  BS went low as 60s while off of metformin  Will stop Metformin completely    HTN  Continue ACE inh    Hyperlipidemia  Holding statins due to rhabdo    Depression/Anxiety  Continue psych meds  ??  Code Status: Full  Surrogate Decision Maker: Daughter  DVT Prophylaxis: Lovenox  GI Prophylaxis: not indicated  Baseline: Independent. Has moved down from [place] recently.                           Subjective: Pt seen and examined at bedside. NAD, denies shortness of breath. Overnight events d/w RN   CHIEF COMPLAINT: f/u "Fall"  ??  Review of Systems:  Symptom Y/N Comments  Symptom Y/N Comments   Fever/Chills n   Chest Pain n    Poor Appetite    Edema     Cough n   Abdominal Pain n    Sputum    Joint Pain     SOB/DOE    Pruritis/Rash     Nausea/vomit    Tolerating PT/OT     Diarrhea    Tolerating Diet y    Constipation    Other       Could NOT obtain due to:      Objective:     VITALS:   Last 24hrs VS reviewed since prior progress note. Most recent are:  Patient Vitals for the past 24 hrs:   Temp Pulse Resp BP SpO2   03/25/17 1159 98.3 ??F (36.8 ??C) 80 22 124/61 97 %   03/25/17 0819 97.9 ??F (36.6 ??C) 73 22 129/70 98 %   03/25/17 0400 98 ??F (36.7 ??C) 86 20 130/72 98 %   03/24/17 2330 97.8 ??F (36.6 ??C) 92 18 142/72 98 %   03/24/17 1918 97.4 ??F (36.3 ??C) 92 20 138/68 97 %    03/24/17 1508 98.5 ??F (36.9 ??C) 79 18 133/78 100 %       Intake/Output Summary (Last 24 hours) at 03/25/17 1356  Last data filed at 03/25/17 0512   Gross per 24 hour   Intake          2281.25 ml   Output              650 ml   Net          1631.25 ml        PHYSICAL EXAM:  General: WD, WN. Alert, cooperative, no acute distress????  EENT:  EOMI. Anicteric sclerae. MMM  Resp:  CTA bilaterally, no wheezing or rales.  No accessory muscle use  CV:  Regular  rhythm,?? No edema  GI:  Soft, Non distended, Non  tender. ??+Bowel sounds  Neurologic:?? Alert and oriented X 3, normal speech,   Psych:???? Fair insight.??Not anxious nor agitated  Skin:  No rashes.  No jaundice    Reviewed most current lab test results and cultures  YES  Reviewed most current radiology test results   YES  Review and summation of old records today    NO  Reviewed patient's current orders and MAR    YES  PMH/SH reviewed - no change compared to H&P  ________________________________________________________________________  Care Plan discussed with:    Comments   Patient y    Family      RN y    Buyer, retail                        Multidiciplinary team rounds were held today with case manager, nursing, pharmacist and Higher education careers adviser.  Patient's plan of care was discussed; medications were reviewed and discharge planning was addressed.     ________________________________________________________________________  Total NON critical care TIME:  25  Minutes    Total CRITICAL CARE TIME Spent:   Minutes non procedure based      Comments   >50% of visit spent in counseling and coordination of care     ________________________________________________________________________  Jamesetta So, MD     Procedures: see electronic medical records for all procedures/Xrays and details which were not copied into this note but were reviewed prior to creation of Plan.      LABS:  I reviewed today's most current labs and imaging studies.  Pertinent labs include:   Recent Labs      03/22/17   2021   WBC  8.1   HGB  12.0   HCT  36.1   PLT  419*     Recent Labs      03/25/17   0146  03/24/17   0512  03/22/17   2021   NA  141  143  139   K  4.2  4.2  3.6   CL  107  109*  103   CO2  28  28  25    GLU  63*  70  123*   BUN  14  14  12    CREA  0.83  0.89  1.13*   CA  8.2*  8.1*  9.0   ALB   --    --   3.7   TBILI   --    --   0.2   SGOT   --    --   69*   ALT   --    --   36       Signed: Jamesetta So, MD

## 2017-03-25 NOTE — Progress Notes (Signed)
Bedside and Verbal shift change report given to Maricar, RN (oncoming nurse) by Stephanie RN (offgoing nurse).  Report given with SBAR, Kardex, Intake/Output, MAR and Recent Results.
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
## 2017-03-25 NOTE — Progress Notes (Addendum)
Labs reviewed from 0146 bS noted at 563 , accuchek done BS 78 ipt given 4 oz of juice o dink .   0510 BS now at 125 after multiple interventions .

## 2017-03-26 LAB — GLUCOSE, POC
Glucose (POC): 133 mg/dL — ABNORMAL HIGH (ref 65–100)
Glucose (POC): 75 mg/dL (ref 65–100)
Glucose (POC): 102 mg/dL — ABNORMAL HIGH (ref 65–100)
Glucose (POC): 77 mg/dL (ref 65–100)
Glucose (POC): 91 mg/dL (ref 65–100)

## 2017-03-26 LAB — METABOLIC PANEL, BASIC
Anion gap: 6 mmol/L (ref 5–15)
BUN/Creatinine ratio: 15 (ref 12–20)
BUN: 15 MG/DL (ref 6–20)
CO2: 31 mmol/L (ref 21–32)
Calcium: 8.7 MG/DL (ref 8.5–10.1)
Chloride: 105 mmol/L (ref 97–108)
Creatinine: 0.98 MG/DL (ref 0.55–1.02)
GFR est AA: >60 mL/min/{1.73_m2} (ref >60)
GFR est non-AA: 56 mL/min/{1.73_m2} — ABNORMAL LOW (ref >60)
Glucose: 59 mg/dL — ABNORMAL LOW (ref 65–100)
Potassium: 4.8 mmol/L (ref 3.5–5.1)
Sodium: 142 mmol/L (ref 136–145)

## 2017-03-26 LAB — CK: CK: 449 U/L — ABNORMAL HIGH (ref 26–192)

## 2017-03-26 LAB — HEMOGLOBIN A1C WITH EAG
Est. average glucose: 126 mg/dL
Hemoglobin A1c: 6 % (ref 4.2–6.3)

## 2017-03-26 LAB — VALPROIC ACID: Valproic acid: 80 ug/ml (ref 50–100)

## 2017-03-26 LAB — LACOSAMIDE
Lacosamide: 4.9 ug/mL — ABNORMAL LOW (ref 5.0–10.0)
Lacosamide: 5.1 ug/mL (ref 5.0–10.0)

## 2017-03-26 MED ORDER — CEPHALEXIN 500 MG CAP
500 mg | ORAL_CAPSULE | Freq: Two times a day (BID) | ORAL | 0 refills | Status: DC
Start: 2017-03-26 — End: 2017-04-13

## 2017-03-26 MED ORDER — DIVALPROEX 500 MG TAB, DELAYED RELEASE
500 mg | ORAL_TABLET | Freq: Two times a day (BID) | ORAL | 0 refills | Status: DC
Start: 2017-03-26 — End: 2017-05-01

## 2017-03-26 MED ORDER — OTHER
0 refills | Status: DC
Start: 2017-03-26 — End: 2017-07-18

## 2017-03-26 MED FILL — LISINOPRIL 5 MG TAB: 5 mg | ORAL | Qty: 1

## 2017-03-26 MED FILL — LOVENOX 40 MG/0.4 ML SUBCUTANEOUS SYRINGE: 40 mg/0.4 mL | SUBCUTANEOUS | Qty: 0.4

## 2017-03-26 MED FILL — METHYLPHENIDATE 5 MG TAB: 5 mg | ORAL | Qty: 1

## 2017-03-26 MED FILL — DEPAKOTE 250 MG TABLET,DELAYED RELEASE: 250 mg | ORAL | Qty: 2

## 2017-03-26 MED FILL — VIMPAT 50 MG TABLET: 50 mg | ORAL | Qty: 3

## 2017-03-26 MED FILL — CHILDREN'S ASPIRIN 81 MG CHEWABLE TABLET: 81 mg | ORAL | Qty: 1

## 2017-03-26 MED FILL — CITALOPRAM 20 MG TAB: 20 mg | ORAL | Qty: 1

## 2017-03-26 MED FILL — VITAMIN D3 25 MCG (1,000 UNIT) TABLET: 25 mcg (1,000 unit) | ORAL | Qty: 1

## 2017-03-26 MED FILL — BD POSIFLUSH NORMAL SALINE 0.9 % INJECTION SYRINGE: INTRAMUSCULAR | Qty: 10

## 2017-03-26 NOTE — Progress Notes (Addendum)
CM [facility] HH [facility] to [facility] via allscripts. CM called pt's sister and daughter and left message about pt discharging today. CM updated pt's demographic sheet with an updated phone number pt's daughter. F/u appts made and documented on the discharge paperwork. Pt's family transported pt home by car.    Care Management Interventions  PCP Verified by CM: Yes (Dr. Gweneth FritterSeabrook, NP)  Mode of Transport at Discharge: Other (see comment) (pt's family tranported by car)  Transition of Care Consult (CM Consult): Discharge Planning, Home Health  [facility]: No  Reason [facility] Chosen: Patient already serviced by other [facility] care[facility]hosp[facility] ([facility])  Discharge Durable Medical Equipment: No  Physical Therapy Consult: Yes  Occupational Therapy Consult: Yes  Speech Therapy Consult: No  Current Support Network: Lives Alone, Own Home (lives in a 2 story townhome with steps)  Confirm Follow Up Transport: Family  Plan discussed with Pt/Family/Caregiver: Yes  Freedom of Choice Offered: Yes  Discharge Location  Discharge Placement: Home with home health    Lanice SchwabChristine M Breidenbaugh, RN  [phone]

## 2017-03-26 NOTE — Progress Notes (Cosign Needed)
PCP TOC appt scheduled with Dr. Gweneth FritterSeabrook on 04/02/2017 at 1:30pm. Appt added to AVS. Mahlon GammonAntonio Fisher, CM Specialist
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
## 2017-03-26 NOTE — Progress Notes (Signed)
Problem: Falls - Risk of  Goal: *Absence of Falls  Document [name] Fall Risk and appropriate interventions in the flowsheet.   Fall Risk Interventions:  Mobility Interventions: Bed/chair exit alarm, Patient to call before getting OOB         Medication Interventions: Bed/chair exit alarm, Patient to call before getting OOB    Elimination Interventions: Bed/chair exit alarm, Call light in reach, Toilet paper/wipes in reach, Toileting schedule/hourly rounds    History of Falls Interventions: Bed/chair exit alarm

## 2017-03-26 NOTE — Progress Notes (Signed)
BS 59 on lab draw 75 with accucheck machine .intervention done per protocol. Pt asymptomatic.    0545 BS now 102     0730Bedside shift change report given to WellPointMelanie RN (oncoming nurse) by me (offgoing nurse).  Report given with SBAR, MAR and Recent Results.

## 2017-03-26 NOTE — Discharge Summary (Signed)
Hospitalist Discharge Summary     Patient ID:  Meghan [name]  [id number]  71 y.o.  1946/03/10    PCP on record: Gwynn Burly, NP    Admit date: 03/22/2017  Discharge date and time: 03/26/2017      DISCHARGE DIAGNOSIS:    Rhabdomyolysis POA  Syncope, ? Pior or post fall as mixed history  Seizure disorder  DMII  HTN  Hyperlipidemia  Depression/Anxiety    CONSULTATIONS:  IP CONSULT TO NEUROLOGY    Excerpted HPI from H&P of Meghan Hansen, MD:  Meghan [name] is a 71 y.o.  African American female who presents with CC listed above. Pt states that yesterday evening she was walking down the steps in her house to get to her living room when she fell down and "passed out." Pt states that she recovered quickly but that the left side of her face hurt afterwards. She has a seizure history and the last seizure was in June. She states that she is compliant with her medications and does not miss any doses. She denied any incontinence or tongue biting. She denied any CP, nausea, dizziness, weakness or SOB prior to the event.     ______________________________________________________________________  DISCHARGE SUMMARY/HOSPITAL COURSE:  for full details see H&P, daily progress notes, labs, consult notes.     Rhabdomyolysis POA  Due to been on the floor vs seizure; CK down to 449 from 2897. Recheck CK level this Wednesday. The result should be send to her pcp's office.  Received  IVF  Holding statins, may be resume after she follows up with her pcp    Syncope, ? Pior or post fall as mixed history  Neurology doesn't believe this to be a seizure  2D echo with normal EF  Carotid dopplers without significant stenosis  Continue with Vimpat 150mg  and depakote 500mg  twice a day. FUP with Dr. Sallee Lange in 1-2 weeks  MRI brain with acute changes  TSH ok  ??  Seizure disorder  Continue Valproic Acid and Vimpat    DMII  Advised her to check blood glucose before breakfast and dinner. Bring the result to her fup visit with pcp.   Discussed about importance of following diabetic diet  HgbA1C 6.0  May stop metformin at this time.    HTN  Continue ACE inh  ??  Hyperlipidemia  Holding statins due to rhabdo    Depression/Anxiety  Continue psych meds    Uncomplicated UTI  Urine cx grew 11000 colonies of e-coli. Advised to complete 3 days course of keflex    _______________________________________________________________________  Patient seen and examined by me on discharge day.  Pertinent Findings:  Gen:    Not in distress  Chest: Clear lungs  CVS:   Regular rhythm.  No edema  Abd:  Soft, not distended, not tender  Neuro:  Alert, orient x 4  _______________________________________________________________________  DISCHARGE MEDICATIONS:   Current Discharge Medication List      START taking these medications    Details   cephALEXin (KEFLEX) 500 mg capsule Take 1 Cap by mouth two (2) times a day. Indications: uncomplicated UTI  Qty: 6 Cap, Refills: 0      OTHER CK  Qty: 1 Act, Refills: 0    Comments: Send the result to Dr. Debbrah Alar office  Associated Diagnoses: Elevated CPK         CONTINUE these medications which have CHANGED    Details   divalproex DR (DEPAKOTE) 500 mg tablet Take 1 Tab by mouth  two (2) times a day.  Qty: 60 Tab, Refills: 0         CONTINUE these medications which have NOT CHANGED    Details   citalopram (CELEXA) 20 mg tablet Take 1 Tab by mouth daily.  Qty: 30 Tab, Refills: 3    Associated Diagnoses: Depression, unspecified depression type      lacosamide (VIMPAT) 150 mg tab tablet Take 1 Tab by mouth two (2) times a day. Max Daily Amount: 300 mg.  Qty: 60 Tab, Refills: 0    Associated Diagnoses: Seizure disorder (HCC)      fluticasone (FLONASE) 50 mcg/actuation nasal spray 2 sprays each nostril daily  Qty: 1 Bottle, Refills: 3    Associated Diagnoses: Environmental and seasonal allergies      lisinopril (PRINIVIL, ZESTRIL) 5 mg tablet Take 1 Tab by mouth daily.  Qty: 30 Tab, Refills: 6     Associated Diagnoses: Essential hypertension      cholecalciferol (VITAMIN D3) 1,000 unit cap take 1 capsule by mouth once daily  Qty: 30 Cap, Refills: 0      gabapentin (NEURONTIN) 100 mg capsule take 1 capsule by mouth three times a day  Qty: 90 Cap, Refills: 0      garlic extract 161 mg tab Take 1,000 mg by mouth daily.      methylphenidate HCl (RITALIN) 5 mg tablet Take 1 Tab (5 mg total) by mouth daily.  Max Daily Amount: 5 mg  Qty: 30 Tab, Refills: 0    Associated Diagnoses: MCI (mild cognitive impairment); Post-traumatic brain syndrome; Mild cognitive impairment      SUMAtriptan (IMITREX) 100 mg tablet 1 tab at onset of moderate-severe migraine; may repeat 1 tab in 2 hours; Limit: 2 tabs in 24/ hrs, not more than 3 days a week  Qty: 12 Tab, Refills: 3      ondansetron (ZOFRAN ODT) 4 mg disintegrating tablet Take 1 Tab by mouth every eight (8) hours as needed for Nausea.  Qty: 10 Tab, Refills: 0      naloxone (NARCAN) 4 mg/actuation nasal spray Use 1 spray intranasally into 1 nostril. Use a new Narcan nasal spray for subsequent doses and administer into alternating nostrils. May repeat every 2 to 3 minutes as needed.  Qty: 1 Each, Refills: 0      aspirin 81 mg chewable tablet Take 81 mg by mouth daily.         STOP taking these medications       metFORMIN (GLUCOPHAGE) 500 mg tablet Comments:   Reason for Stopping:         atorvastatin (LIPITOR) 10 mg tablet Comments:   Reason for Stopping:         aspirin delayed-release 81 mg tablet Comments:   Reason for Stopping:         fenofibrate (LOFIBRA) 54 mg tablet Comments:   Reason for Stopping:         HYDROcodone-acetaminophen (NORCO) 5-325 mg per tablet Comments:   Reason for Stopping:         diazepam (VALIUM) 5 mg tablet Comments:   Reason for Stopping:               My Recommended Diet, Activity, Wound Care, and follow-up labs are listed in the patient's Discharge Insturctions which I have personally completed and reviewed.     _______________________________________________________________________  DISPOSITION:    Home with Family: y   Home with HH/PT/OT/RN:    SNF/LTC:    SAHR:  OTHER:        Condition at Discharge:  Stable  _______________________________________________________________________  Follow up with:   PCP : Gwynn BurlyBerna A Seabrook, NP  Follow-up Information     Follow up With Details Comments Contact Info    Gwynn BurlyBerna A Seabrook, NP Go on 03/28/2017 Please follow up on March 28, 2017 at 1:30 [address]  [place] [id number]  [phone]      Fletcher AnonErica J Davis, NP Go on 05/01/2017 at 11:00 as previously scheduled  [address]  [address]  [place] [id number]  [phone]      AT HOME CARE  PT, OT and Nursing services [address]  [place] [id number]  [phone]    [facility]   [address]  [address] [id number]  [phone]              Total time in minutes spent coordinating this discharge (includes going over instructions, follow-up, prescriptions, and preparing report for sign off to her PCP) :  30 minutes    Signed:  Taesean Reth Shelbie Ammons Erasmus Bistline, NP

## 2017-03-26 NOTE — Discharge Summary (Signed)
Discharge Summary by Meghan Done D, NP at 03/26/17 4540                Author: Benita Stabile, NP  Service: Internal Medicine  Author Type: Nurse Practitioner       Filed: 03/26/17 1135  Date of Service: 03/26/17 0807  Status: Attested           Editor: Meghan Stabile, NP (Nurse Practitioner)  Cosigner: Meghan So, MD at 03/26/17 1300          Attestation signed by Meghan So, MD at 03/26/17 1300          Attending Hospitalist Attestation:      I have personally seen and examined the patient, reviewed pertinent labs and imaging, and discussed the plan of care with NP Meghan [name].  I reviewed and agree with the history, exam, assessment and plan as outlined in her note.        Exam: NAD, Lung CTA      A/P: CK better, tolerating PO. Discharge home with daughter (she claims pt will move with her for 24hour care)      Additional time:  10  min rendering and coordinating care                                                             Hospitalist Discharge Summary        Patient ID:   Meghan [name]   [id number]   70 y.o.   1946-01-15      PCP on record: Meghan Burly, NP      Admit date: 03/22/2017   Discharge date and time: 03/26/2017         DISCHARGE DIAGNOSIS:      Rhabdomyolysis POA   Syncope, ? Pior or post fall as mixed history   Seizure disorder   DMII   HTN   Hyperlipidemia   Depression/Anxiety     CONSULTATIONS:   IP CONSULT TO NEUROLOGY      Excerpted HPI from H&P of Meghan Hansen, MD:   Meghan [name] is a 71 y.o.  African American female who presents with CC listed above. Pt states that yesterday evening she was walking down the steps in her house to  get to her living room when she fell down and "passed out." Pt states that she recovered quickly but that the left side of her face hurt afterwards. She has a seizure history and the last seizure was in June. She states that she is compliant with her  medications and does not miss any doses. She denied any incontinence or tongue biting. She  denied any CP, nausea, dizziness, weakness or SOB prior to the event.       ______________________________________________________________________   DISCHARGE SUMMARY/HOSPITAL COURSE:   for full details see H&P, daily progress notes, labs, consult notes.       Rhabdomyolysis POA   Due to been on the floor vs seizure; CK down to 449 from 2897. Recheck CK level this Wednesday. The result should be send to her pcp's office.   Received  IVF   Holding statins, may be resume after she follows up with her pcp      Syncope, ? Pior or post fall as mixed history   Neurology  doesn't believe this to be a seizure   2D echo with normal EF   Carotid dopplers without significant stenosis   Continue with Vimpat 150mg  and depakote 500mg  twice a day. FUP with Meghan [name] in 1-2 weeks   MRI brain with acute changes   TSH ok   ??   Seizure disorder   Continue Valproic Acid and Vimpat    DMII   Advised her to check blood glucose before breakfast and dinner. Bring the result to her fup visit with pcp.   Discussed about importance of following diabetic diet   HgbA1C 6.0   May stop metformin at this time.     HTN  Continue ACE inh   ??   Hyperlipidemia   Holding statins due to rhabdo     Depression/Anxiety  Continue psych meds      Uncomplicated UTI   Urine cx grew 11000 colonies of e-coli. Advised to complete 3 days course of keflex      _______________________________________________________________________   Patient seen and examined by me on discharge day.   Pertinent Findings:   Gen:    Not in distress   Chest: Clear lungs   CVS:   Regular rhythm.  No edema   Abd:  Soft, not distended, not tender   Neuro:  Alert, orient x 4   _______________________________________________________________________   DISCHARGE MEDICATIONS:      Current Discharge Medication List              START taking these medications          Details        cephALEXin (KEFLEX) 500 mg capsule  Take 1 Cap by mouth two (2) times a day. Indications: uncomplicated UTI   Qty:  6 Cap, Refills:  0               OTHER  CK   Qty: 1 Act, Refills:  0          Comments: Send the result to Meghan [name] office   Associated Diagnoses: Elevated CPK                     CONTINUE these medications which have CHANGED          Details        divalproex DR (DEPAKOTE) 500 mg tablet  Take 1 Tab by mouth two (2) times a day.   Qty: 60 Tab, Refills:  0                     CONTINUE these medications which have NOT CHANGED          Details        citalopram (CELEXA) 20 mg tablet  Take 1 Tab by mouth daily.   Qty: 30 Tab, Refills:  3          Associated Diagnoses: Depression, unspecified depression type               lacosamide (VIMPAT) 150 mg tab tablet  Take 1 Tab by mouth two (2) times a day. Max Daily Amount: 300 mg.   Qty: 60 Tab, Refills:  0          Associated Diagnoses: Seizure disorder (HCC)               fluticasone (FLONASE) 50 mcg/actuation nasal spray  2 sprays each nostril daily   Qty: 1 Bottle, Refills:  3  Associated Diagnoses: Environmental and seasonal allergies               lisinopril (PRINIVIL, ZESTRIL) 5 mg tablet  Take 1 Tab by mouth daily.   Qty: 30 Tab, Refills:  6          Associated Diagnoses: Essential hypertension               cholecalciferol (VITAMIN D3) 1,000 unit cap  take 1 capsule by mouth once daily   Qty: 30 Cap, Refills:  0               gabapentin (NEURONTIN) 100 mg capsule  take 1 capsule by mouth three times a day   Qty: 90 Cap, Refills:  0               garlic extract 161 mg tab  Take 1,000 mg by mouth daily.               methylphenidate HCl (RITALIN) 5 mg tablet  Take 1 Tab (5 mg total) by mouth daily.  Max Daily Amount: 5 mg   Qty: 30 Tab, Refills:  0          Associated Diagnoses: MCI (mild cognitive impairment); Post-traumatic brain syndrome; Mild cognitive  impairment               SUMAtriptan (IMITREX) 100 mg tablet  1 tab at onset of moderate-severe migraine; may repeat 1 tab in 2 hours; Limit: 2 tabs in 24/ hrs, not more than 3 days a week   Qty: 12  Tab, Refills:  3               ondansetron (ZOFRAN ODT) 4 mg disintegrating tablet  Take 1 Tab by mouth every eight (8) hours as needed for Nausea.   Qty: 10 Tab, Refills:  0               naloxone (NARCAN) 4 mg/actuation nasal spray  Use 1 spray intranasally into 1 nostril. Use a new Narcan nasal spray for subsequent doses and administer into alternating nostrils. May repeat every 2  to 3 minutes as needed.   Qty: 1 Each, Refills:  0               aspirin 81 mg chewable tablet  Take 81 mg by mouth daily.                     STOP taking these medications                  metFORMIN (GLUCOPHAGE) 500 mg tablet  Comments:    Reason for Stopping:                      atorvastatin (LIPITOR) 10 mg tablet  Comments:    Reason for Stopping:                      aspirin delayed-release 81 mg tablet  Comments:    Reason for Stopping:                      fenofibrate (LOFIBRA) 54 mg tablet  Comments:    Reason for Stopping:                      HYDROcodone-acetaminophen (NORCO) 5-325 mg per tablet  Comments:    Reason for Stopping:  diazepam (VALIUM) 5 mg tablet  Comments:    Reason for Stopping:                             My Recommended Diet, Activity, Wound Care, and follow-up labs are listed in the patient's Discharge Insturctions which I have personally completed and reviewed.      _______________________________________________________________________      DISPOSITION:          Home with Family:  y        Home with HH/PT/OT/RN:          SNF/LTC:          SAHR:          OTHER:             Condition at Discharge:  Stable   _______________________________________________________________________   Follow up with:    PCP : Meghan BurlyBerna A Seabrook, NP     Follow-up Information        Follow up With  Details  Comments  Contact Info             Meghan BurlyBerna A Seabrook, NP  Go on 03/28/2017  Please follow up on March 28, 2017 at 1:30  [address]   [place] [id number]   [phone]                Fletcher AnonErica J Davis,  NP  Go on 05/01/2017  at 11:00 as previously scheduled   [address]   [address]   [place] [id number]   [phone]                AT HOME CARE    PT, OT and Nursing services  [address]   [place] [id number]   [phone]             [facility]      [address]   [place] [id number]   [phone]                      Total time in minutes spent coordinating this discharge (includes going over instructions, follow-up, prescriptions, and preparing report for sign off to her PCP) :  30 minutes      Signed:   Bliss Tsang Shelbie Ammons Shaunita Seney, NP

## 2017-03-28 ENCOUNTER — Ambulatory Visit
Admit: 2017-03-28 | Discharge: 2017-03-28 | Payer: PRIVATE HEALTH INSURANCE | Attending: Nurse Practitioner | Primary: Internal Medicine

## 2017-03-28 DIAGNOSIS — Z09 Encounter for follow-up examination after completed treatment for conditions other than malignant neoplasm: Secondary | ICD-10-CM

## 2017-03-28 LAB — AMB POC GLUCOSE BLOOD, BY GLUCOSE MONITORING DEVICE: Glucose POC: 99 mg/dL

## 2017-03-28 LAB — LACOSAMIDE: Lacosamide: 6.8 ug/mL (ref 5.0–10.0)

## 2017-03-28 MED ORDER — FENOFIBRATE 54 MG TAB
54 mg | ORAL_TABLET | Freq: Every day | ORAL | 3 refills | Status: DC
Start: 2017-03-28 — End: 2017-09-10

## 2017-03-28 NOTE — Progress Notes (Signed)
Chief Complaint   Patient presents with   ??? Hospital Follow Up     1. Have you been to the ER, urgent care clinic since your last visit?  Hospitalized since your last visit?No    2. Have you seen or consulted any other health care providers [facility] since your last visit?  Include any pap smears or colon screening. No     PHQ over the last two weeks 01/10/2017   Little interest or pleasure in doing things Several days   Feeling down, depressed, irritable, or hopeless Several days   Total Score PHQ 2 2     Fall Risk Assessment, last 12 mths 02/26/2017   Able to walk? Yes   Fall in past 12 months? Yes   Fall with injury? Yes   Number of falls in past 12 months 2   Fall Risk Score 3     Abuse Screening Questionnaire 01/18/2017   Do you ever feel afraid of your partner? N   Are you in a relationship with someone who physically or mentally threatens you? N   Is it safe for you to go home? YJeannie Fend

## 2017-03-28 NOTE — Patient Instructions (Addendum)
High Cholesterol: Care Instructions  Your Care Instructions    Cholesterol is a type of fat in your blood. It is needed for many body functions, such as making new cells. Cholesterol is made by your body. It also comes from food you eat. High cholesterol means that you have too much of the fat in your blood. This raises your risk of a heart attack and stroke.  LDL and HDL are part of your total cholesterol. LDL is the "bad" cholesterol. High LDL can raise your risk for heart disease, heart attack, and stroke. HDL is the "good" cholesterol. It helps clear bad cholesterol from the body. High HDL is linked with a lower risk of heart disease, heart attack, and stroke.  Your cholesterol levels help your doctor find out your risk for having a heart attack or stroke. You and your doctor can talk about whether you need to lower your risk and what treatment is best for you.  A heart-healthy lifestyle along with medicines can help lower your cholesterol and your risk. The way you choose to lower your risk will depend on how high your risk is for heart attack and stroke. It will also depend on how you feel about taking medicines.  Follow-up care is a key part of your treatment and safety. Be sure to make and go to all appointments, and call your doctor if you are having problems. It's also a good idea to know your test results and keep a list of the medicines you take.  How can you care for yourself at home?  ?? Eat a variety of foods every day. Good choices include fruits, vegetables, whole grains (like oatmeal), dried beans and peas, nuts and seeds, soy products (like tofu), and fat-free or low-fat dairy products.  ?? Replace butter, margarine, and hydrogenated or partially hydrogenated oils with olive and canola oils. (Canola oil margarine without trans fat is fine.)  ?? Replace red meat with fish, poultry, and soy protein (like tofu).  ?? Limit processed and packaged foods like chips, crackers, and cookies.   ?? Bake, broil, or steam foods. Don't fry them.  ?? Be physically active. Get at least 30 minutes of exercise on most days of the week. Walking is a good choice. You also may want to do other activities, such as running, swimming, cycling, or playing tennis or team sports.  ?? Stay at a healthy weight or lose weight by making the changes in eating and physical activity listed above. Losing just a small amount of weight, even 5 to 10 pounds, can reduce your risk for having a heart attack or stroke.  ?? Do not smoke.  When should you call for help?  Watch closely for changes in your health, and be sure to contact your doctor if:  ?? ?? You need help making lifestyle changes.   ?? ?? You have questions about your medicine.   Where can you learn more?  Go to [email or web address].  Enter [phone] in the search box to learn more about "High Cholesterol: Care Instructions."  Current as of: Dec 22, 2015  Content Version: 11.7  ?? 2006-2018 [employer]. Care instructions adapted under license by [employer] (which disclaims liability or warranty for this information). If you have questions about a medical condition or this instruction, always ask your healthcare professional. [employer] any warranty or liability for your use of this information.       High Blood Pressure: Care Instructions  Your Care Instructions  If your blood pressure is usually above 130/80, you have high blood pressure, or hypertension. That means the top number is 130 or higher or the bottom number is 80 or higher, or both.  Despite what a lot of people think, high blood pressure usually doesn't cause headaches or make you feel dizzy or lightheaded. It usually has no symptoms. But it does increase your risk for heart attack, stroke, and kidney or eye damage. The higher your blood pressure, the more your risk increases.  Your doctor will give you a goal for your blood pressure. Your goal will  be based on your health and your age.  Lifestyle changes, such as eating healthy and being active, are always important to help lower blood pressure. You might also take medicine to reach your blood pressure goal.  Follow-up care is a key part of your treatment and safety. Be sure to make and go to all appointments, and call your doctor if you are having problems. It's also a good idea to know your test results and keep a list of the medicines you take.  How can you care for yourself at home?  Medical treatment  ?? If you stop taking your medicine, your blood pressure will go back up. You may take one or more types of medicine to lower your blood pressure. Be safe with medicines. Take your medicine exactly as prescribed. Call your doctor if you think you are having a problem with your medicine.  ?? Talk to your doctor before you start taking aspirin every day. Aspirin can help certain people lower their risk of a heart attack or stroke. But taking aspirin isn't right for everyone, because it can cause serious bleeding.  ?? See your doctor regularly. You may need to see the doctor more often at first or until your blood pressure comes down.  ?? If you are taking blood pressure medicine, talk to your doctor before you take decongestants or anti-inflammatory medicine, such as ibuprofen. Some of these medicines can raise blood pressure.  ?? Learn how to check your blood pressure at home.  Lifestyle changes  ?? Stay at a healthy weight. This is especially important if you put on weight around the waist. Losing even 10 pounds can help you lower your blood pressure.  ?? If your doctor recommends it, get more exercise. Walking is a good choice. Bit by bit, increase the amount you walk every day. Try for at least 30 minutes on most days of the week. You also may want to swim, bike, or do other activities.  ?? Avoid or limit alcohol. Talk to your doctor about whether you can drink any alcohol.   ?? Try to limit how much sodium you eat to less than 2,300 milligrams (mg) a day. Your doctor may ask you to try to eat less than 1,500 mg a day.  ?? Eat plenty of fruits (such as bananas and oranges), vegetables, legumes, whole grains, and low-fat dairy products.  ?? Lower the amount of saturated fat in your diet. Saturated fat is found in animal products such as milk, cheese, and meat. Limiting these foods may help you lose weight and also lower your risk for heart disease.  ?? Do not smoke. Smoking increases your risk for heart attack and stroke. If you need help quitting, talk to your doctor about stop-smoking programs and medicines. These can increase your chances of quitting for good.  When should you call for help?  Call 911 anytime you think  you may need emergency care. This may mean having symptoms that suggest that your blood pressure is causing a serious heart or blood vessel problem. Your blood pressure may be over 180/110.  ??For example, call 911 if:  ?? ?? You have symptoms of a heart attack. These may include:  ?? Chest pain or pressure, or a strange feeling in the chest.  ?? Sweating.  ?? Shortness of breath.  ?? Nausea or vomiting.  ?? Pain, pressure, or a strange feeling in the back, neck, jaw, or upper belly or in one or both shoulders or arms.  ?? Lightheadedness or sudden weakness.  ?? A fast or irregular heartbeat.   ?? ?? You have symptoms of a stroke. These may include:  ?? Sudden numbness, tingling, weakness, or loss of movement in your face, arm, or leg, especially on only one side of your body.  ?? Sudden vision changes.  ?? Sudden trouble speaking.  ?? Sudden confusion or trouble understanding simple statements.  ?? Sudden problems with walking or balance.  ?? A sudden, severe headache that is different from past headaches.   ?? ?? You have severe back or belly pain.   ??Do not wait until your blood pressure comes down on its own. Get help right away.  ??Call your doctor now or seek immediate care if:   ?? ?? Your blood pressure is much higher than normal (such as 180/110 or higher), but you don't have symptoms.   ?? ?? You think high blood pressure is causing symptoms, such as:  ?? Severe headache.  ?? Blurry vision.   ??Watch closely for changes in your health, and be sure to contact your doctor if:  ?? ?? Your blood pressure measures 140/90 or higher at least 2 times. That means the top number is 140 or higher or the bottom number is 90 or higher, or both.   ?? ?? You think you may be having side effects from your blood pressure medicine.   ?? ?? Your blood pressure is usually normal, but it goes above normal at least 2 times.   Where can you learn more?  Go to [employer].  Enter [phone] in the search box to learn more about "High Blood Pressure: Care Instructions."  Current as of: July 19, 2016  Content Version: 11.7  ?? 2006-2018 [employer]. Care instructions adapted under license by [employer] (which disclaims liability or warranty for this information). If you have questions about a medical condition or this instruction, always ask your healthcare professional. [employer] disclaims any warranty or liability for your use of this information.       Neuropathic Pain: Care Instructions  Your Care Instructions    Neuropathic pain is caused by pressure on or damage to your nerves. It's often simply called nerve pain. Some people feel this type of pain all the time. For others, it comes and goes.  Diabetes, shingles, or an injury can cause nerve pain. Many people say the pain feels sharp, burning, or stabbing. But some people feel it as a dull ache. In some cases, it makes your skin very sensitive. So touch, pressure, and other sensations that did not hurt before may now cause pain.  It's important to know that this kind of pain is real and can affect your quality of life. It's also important to know that treatment can help.  Treatment includes pain medicines, exercise, and physical therapy.  Medicines can help reduce the number of pain signals that  travel over the nerves. This can make the painful areas less sensitive. It can also help you sleep better and improve your mood. But medicines are only one part of successful treatment.  Most people do best with more than one kind of treatment. Your doctor may recommend that you try cognitive-behavioral therapy and stress management. Or, if needed, you may decide to try to quit smoking, lower your blood pressure, or better control blood sugar. These kinds of healthy changes can also make a difference.  If you feel that your treatment is not working, talk to your doctor. And be sure to tell your doctor if you think you might be depressed or anxious. These are common problems that can also be treated.  Follow-up care is a key part of your treatment and safety. Be sure to make and go to all appointments, and call your doctor if you are having problems. It's also a good idea to know your test results and keep a list of the medicines you take.  How can you care for yourself at home?  ?? Be safe with medicines. Read and follow all instructions on the label.  ?? If the doctor gave you a prescription medicine for pain, take it as prescribed.  ?? If you are not taking a prescription pain medicine, ask your doctor if you can take an over-the-counter medicine.  ?? Save hard tasks for days when you have less pain. Follow a hard task with an easy task. And remember to take breaks.  ?? Relax, and reduce stress. You may want to try deep breathing or meditation. These can help.  ?? Keep moving. Gentle, daily exercise can help reduce pain. Your doctor or physical therapist can tell you what type of exercise is best for you. This may include walking, swimming, and stationary biking. It may also include stretches and range-of-motion exercises.  ?? Try heat, cold packs, and massage.   ?? Get enough sleep. Constant pain can make you more tired. If the pain makes it hard to sleep, talk with your doctor.  ?? Think positively. Your thoughts can affect your pain. Do fun things to distract yourself from the pain. See a movie, read a book, listen to music, or spend time with a friend.  ?? Keep a pain diary. Try to write down how strong your pain is and what it feels like. Also try to notice and write down how your moods, thoughts, sleep, activities, and medicine affect your pain. These notes can help you and your doctor find the best ways to treat your pain.  Reducing constipation caused by pain medicine  Pain medicines often cause constipation. To reduce constipation:  ?? Include fruits, vegetables, beans, and whole grains in your diet each day. These foods are high in fiber.  ?? Drink plenty of fluids, enough so that your urine is light yellow or clear like water. If you have kidney, heart, or liver disease and have to limit fluids, talk with your doctor before you increase the amount of fluids you drink.  ?? Get some exercise every day. Build up slowly to 30 to 60 minutes a day on 5 or more days of the week.  ?? Take a fiber supplement, such as Citrucel or Metamucil, every day if needed. Read and follow all instructions on the label.  ?? Schedule time each day for a bowel movement. Having a daily routine may help. Take your time and do not strain when having a bowel movement.  ?? Ask  your doctor about a laxative. The goal is to have one easy bowel movement every 1 to 2 days. Do not let constipation go untreated for more than 3 days.  When should you call for help?  Call your doctor now or seek immediate medical care if:  ?? ?? You feel sad, anxious, or hopeless for more than a few days. This could mean you are depressed. Depression is common in people who have a lot of pain. But it can be treated.   ?? ?? You have trouble with bowel movements, such as:  ?? No bowel movement in 3 days.   ?? Blood in the anal area, in your stool, or on the toilet paper.  ?? Diarrhea for more than 24 hours.   ??Watch closely for changes in your health, and be sure to contact your doctor if:  ?? ?? Your pain is getting worse.   ?? ?? You can't sleep because of pain.   ?? ?? You are very worried or anxious about your pain.   ?? ?? You have trouble taking your pain medicine.   ?? ?? You have any concerns about your pain medicine or its side effects.   ?? ?? You have vomiting or cramps for more than 2 hours.   Where can you learn more?  Go to [employer].  Enter [phone] in the search box to learn more about "Neuropathic Pain: Care Instructions."  Current as of: May 22, 2016  Content Version: 11.7  ?? 2006-2018 [employer]. Care instructions adapted under license by [employer] (which disclaims liability or warranty for this information). If you have questions about a medical condition or this instruction, always ask your healthcare professional. [employer] disclaims any warranty or liability for your use of this information.       Rhabdomyolysis: Care Instructions  Your Care Instructions    When you have rhabdomyolysis (say "rab-doh-my-AH-luh-suss"), dying muscle cells cause toxins to build up in the blood. If not treated, it can cause life-threatening damage to the body's organs.  It can be caused by many things, such as severe muscle injury, some medicines (like statins), the flu, and certain blood infections.  Symptoms may include weak muscles, pain, stiffness, fever, and nausea. Your urine may also be dark.  You will get treatment in the hospital. If possible, the doctor will stop the cause of muscle cell death. The doctor will take steps to protect your organs. You may have to stop taking certain medicines if they are the cause of the problem.  You will also get treatment to help the kidneys remove the toxins from  your blood. This includes plenty of fluids. You may get fluids through a vein (by IV). You may also need dialysis.  Follow-up care is a key part of your treatment and safety. Be sure to make and go to all appointments, and call your doctor if you are having problems. It's also a good idea to know your test results and keep a list of the medicines you take.  How can you care for yourself at home?  ?? Take pain medicines exactly as directed.  ?? If the doctor gave you a prescription medicine for pain, take it as prescribed.  ?? If you are not taking a prescription pain medicine, ask your doctor if you can take an over-the-counter medicine.  ?? Talk to your doctor about whether you need to stop taking any medicines. Follow your doctor's instructions about stopping medicines.  ?? Drink  plenty of fluids, enough so that your urine is light yellow or clear like water. If you have kidney, heart, or liver disease and have to limit fluids, talk with your doctor before you increase the amount of fluids you drink.  When should you call for help?  Call your doctor now or seek immediate medical care if:  ?? ?? You have new or worse muscle pain.   ?? ?? You have less urine than normal or no urine.   ?? ?? You have new swelling in your arms or feet.   ?? ?? You have blood in your urine.   ??Watch closely for changes in your health, and be sure to contact your doctor if you do not get better as expected.  Where can you learn more?  Go to [employer].  Enter F129 in the search box to learn more about "Rhabdomyolysis: Care Instructions."  Current as of: Dec 24, 2015  Content Version: 11.7  ?? 2006-2018 [employer]. Care instructions adapted under license by [employer] (which disclaims liability or warranty for this information). If you have questions about a medical condition or this instruction, always ask your healthcare professional. [employer] disclaims any warranty or liability for your use of this information.
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
## 2017-03-28 NOTE — Progress Notes (Signed)
TC to [employer] in Home Care re: form for Home Health Face to Face Certification that certifies patient is homebound to discuss as have questions. Worker was not able to answer my questions and transferred me to his supervisor's voicemail Clydie Braun( [place]; message left). Patient does not appear to be homebound by their definitions, however could use the assistance of  A personal care aide.

## 2017-03-29 LAB — METABOLIC PANEL, COMPREHENSIVE
A-G Ratio: 1.6 (ref 1.2–2.2)
ALT (SGPT): 30 IU/L (ref 0–32)
AST (SGOT): 39 IU/L (ref 0–40)
Albumin: 4.1 g/dL (ref 3.6–4.8)
Alk. phosphatase: 49 IU/L (ref 39–117)
BUN/Creatinine ratio: 14 (ref 12–28)
BUN: 11 mg/dL (ref 8–27)
Bilirubin, total: <0.2 mg/dL (ref 0.0–1.2)
CO2: 26 mmol/L (ref 20–29)
Calcium: 10.1 mg/dL (ref 8.7–10.3)
Chloride: 99 mmol/L (ref 96–106)
Creatinine: 0.8 mg/dL (ref 0.57–1.00)
GFR est AA: 88 mL/min/{1.73_m2} (ref >59)
GFR est non-AA: 76 mL/min/{1.73_m2} (ref >59)
GLOBULIN, TOTAL: 2.5 g/dL (ref 1.5–4.5)
Glucose: 71 mg/dL (ref 65–99)
Potassium: 5 mmol/L (ref 3.5–5.2)
Protein, total: 6.6 g/dL (ref 6.0–8.5)
Sodium: 139 mmol/L (ref 134–144)

## 2017-03-29 LAB — CK: Creatine Kinase,Total: 274 U/L — ABNORMAL HIGH (ref 24–173)

## 2017-03-30 MED ORDER — GABAPENTIN 100 MG CAP
100 mg | ORAL_CAPSULE | ORAL | 0 refills | Status: DC
Start: 2017-03-30 — End: 2017-05-01

## 2017-04-02 ENCOUNTER — Encounter: Attending: Nurse Practitioner | Primary: Internal Medicine

## 2017-04-05 NOTE — Telephone Encounter (Signed)
Randol Kern, [facility] @ [phone]) on two separate occassions.  First call put into [facility] who told me local Agency should be open at the hour I called.  Asked that I call later to number I had been given by family.  Second call to Agency.  Told who I was and asked what Agency needed from [place].   Agency had no knowledge as to why I called.  Stated pt had been open to services since 03/27/17.  Nothing additional was needed.

## 2017-04-06 MED ORDER — ASPIRIN 81 MG TAB, DELAYED RELEASE
81 mg | ORAL_TABLET | ORAL | 0 refills | Status: DC
Start: 2017-04-06 — End: 2017-05-29

## 2017-04-09 NOTE — Telephone Encounter (Signed)
Returned call to Clydie Braun at At [facility].  Clarified with her that the info we sent was accurate as pt states she goes out each day though she does have a history of seizures.  Clydie Braun had no other suggestions for support at this time.

## 2017-04-09 NOTE — Telephone Encounter (Signed)
Thanks,  Dr. Lynell Greenhouse, DNP

## 2017-04-11 ENCOUNTER — Encounter: Attending: Nurse Practitioner | Primary: Internal Medicine

## 2017-04-13 ENCOUNTER — Ambulatory Visit
Admit: 2017-04-13 | Discharge: 2017-04-13 | Payer: PRIVATE HEALTH INSURANCE | Attending: Nurse Practitioner | Primary: Internal Medicine

## 2017-04-13 DIAGNOSIS — G40909 Epilepsy, unspecified, not intractable, without status epilepticus: Secondary | ICD-10-CM

## 2017-04-13 MED ORDER — METHYLPHENIDATE 5 MG TAB
5 mg | ORAL_TABLET | Freq: Every day | ORAL | 0 refills | Status: DC
Start: 2017-04-13 — End: 2017-05-11

## 2017-04-13 NOTE — Patient Instructions (Addendum)
Seizure: Care Instructions  Your Care Instructions    Seizures are caused by abnormal patterns of electrical signals in the brain. They are different for each person.  Seizures can affect movement, speech, vision, or awareness. Some people have only slight shaking of a hand and do not pass out. Other people may pass out and have violent shaking of the whole body. Some people appear to stare into space. They are awake, but they can't respond normally. Later, they may not remember what happened.  You may need tests to identify the type and cause of the seizures.  A seizure may occur only once, or you may have them more than one time. Taking medicines as directed and following up with your doctor may help keep you from having more seizures.  The doctor has checked you carefully, but problems can develop later. If you notice any problems or new symptoms, get medical treatment right away.  Follow-up care is a key part of your treatment and safety. Be sure to make and go to all appointments, and call your doctor if you are having problems. It's also a good idea to know your test results and keep a list of the medicines you take.  How can you care for yourself at home?  ?? Be safe with medicines. Take your medicines exactly as prescribed. Call your doctor if you think you are having a problem with your medicine.  ?? Do not do any activity that could be dangerous to you or others until your doctor says it is safe to do so. For example, do not drive a car, operate machinery, swim, or climb ladders.  ?? Be sure that anyone treating you for any health problem knows that you have had a seizure and what medicines you are taking for it.  ?? Identify and avoid things that may make you more likely to have a seizure. These may include lack of sleep, alcohol or drug use, stress, or not eating.  ?? Make sure you go to your follow-up appointment.  When should you call for help?   Call 911 anytime you think you may need emergency care. For example, call if:  ?? ?? You have another seizure.   ?? ?? You have more than one seizure in 24 hours.   ?? ?? You have new symptoms, such as trouble walking, speaking, or thinking clearly.   ??Call your doctor now or seek immediate medical care if:  ?? ?? You are not acting normally.   ??Watch closely for changes in your health, and be sure to contact your doctor if you have any problems.  Where can you learn more?  Go to InsuranceStats.cahttp://www.healthwise.net/GoodHelpConnections.  Enter [phone] in the search box to learn more about "Seizure: Care Instructions."  Current as of: May 22, 2016  Content Version: 11.7  ?? 2006-2018 [employer]. Care instructions adapted under license by [employer] (which disclaims liability or warranty for this information). If you have questions about a medical condition or this instruction, always ask your healthcare professional. Healthwise, Incorporated disclaims any warranty or liability for your use of this information.       Rhabdomyolysis: Care Instructions  Your Care Instructions    When you have rhabdomyolysis (say "rab-doh-my-AH-luh-suss"), dying muscle cells cause toxins to build up in the blood. If not treated, it can cause life-threatening damage to the body's organs.  It can be caused by many things, such as severe muscle injury, some medicines (like statins), the flu, and certain  blood infections.  Symptoms may include weak muscles, pain, stiffness, fever, and nausea. Your urine may also be dark.  You will get treatment in the hospital. If possible, the doctor will stop the cause of muscle cell death. The doctor will take steps to protect your organs. You may have to stop taking certain medicines if they are the cause of the problem.  You will also get treatment to help the kidneys remove the toxins from your blood. This includes plenty of fluids. You may get fluids through a  vein (by IV). You may also need dialysis.  Follow-up care is a key part of your treatment and safety. Be sure to make and go to all appointments, and call your doctor if you are having problems. It's also a good idea to know your test results and keep a list of the medicines you take.  How can you care for yourself at home?  ?? Take pain medicines exactly as directed.  ?? If the doctor gave you a prescription medicine for pain, take it as prescribed.  ?? If you are not taking a prescription pain medicine, ask your doctor if you can take an over-the-counter medicine.  ?? Talk to your doctor about whether you need to stop taking any medicines. Follow your doctor's instructions about stopping medicines.  ?? Drink plenty of fluids, enough so that your urine is light yellow or clear like water. If you have kidney, heart, or liver disease and have to limit fluids, talk with your doctor before you increase the amount of fluids you drink.  When should you call for help?  Call your doctor now or seek immediate medical care if:  ?? ?? You have new or worse muscle pain.   ?? ?? You have less urine than normal or no urine.   ?? ?? You have new swelling in your arms or feet.   ?? ?? You have blood in your urine.   ??Watch closely for changes in your health, and be sure to contact your doctor if you do not get better as expected.  Where can you learn more?  Go to [employer]Stats.[employer].  Enter F129 in the search box to learn more about "Rhabdomyolysis: Care Instructions."  Current as of: Dec 24, 2015  Content Version: 11.7  ?? 2006-2018 [employer]wise, [employer]. Care instructions adapted under license by [employer] (which disclaims liability or warranty for this information). If you have questions about a medical condition or this instruction, always ask your healthcare professional. Healthwise, Incorporated disclaims any warranty or liability for your use of this information.

## 2017-04-13 NOTE — Progress Notes (Signed)
Chief Complaint   Patient presents with   ??? Results   ??? Dizziness   ??? Medication Refill     needs 90 day supply of meds     1. Have you been to the ER, urgent care clinic since your last visit?  Hospitalized since your last visit?No    2. Have you seen or consulted any other health care providers outside of the [facility] since your last visit?  Include any pap smears or colon screening. No     Fall Risk Assessment, last 12 mths 04/13/2017   Able to walk? Yes   Fall in past 12 months? Yes   Fall with injury? Yes   Number of falls in past 12 months 2   Fall Risk Score 3     Abuse Screening Questionnaire 04/13/2017   Do you ever feel afraid of your partner? N   Are you in a relationship with someone who physically or mentally threatens you? N   Is it safe for you to go home? Y         .
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
## 2017-04-19 NOTE — Telephone Encounter (Signed)
-----   Message from Frederik PearAngela R Gibson sent at 04/18/2017  1:14 PM EDT -----  Regarding: RE: NP. Gweneth FritterSeabrook  Arnoldo Hooker/Telephone      ----- Message -----     From: Markus DaftShequita M Chavis     Sent: 04/06/2017   9:56 AM       To: [facility] Front Office Pool  Subject: NP. Gweneth FritterSeabrook  Daleen Bo/Telephone                         [facility] with [facility] is returning a call back for [facility]  Best call back number is: [phone]

## 2017-04-24 ENCOUNTER — Encounter

## 2017-04-24 ENCOUNTER — Encounter: Admit: 2017-04-24 | Discharge: 2017-11-04 | Payer: PRIVATE HEALTH INSURANCE | Primary: Internal Medicine

## 2017-04-24 NOTE — Telephone Encounter (Signed)
Patient was here today but refused to do the EMG, also she stated her gabapentin is making her dizzy and not feel good. Would like the med changed.

## 2017-04-25 MED ORDER — PREGABALIN 50 MG CAP
50 mg | ORAL_CAPSULE | Freq: Two times a day (BID) | ORAL | 1 refills | Status: DC
Start: 2017-04-25 — End: 2017-05-01

## 2017-05-01 ENCOUNTER — Ambulatory Visit
Admit: 2017-05-01 | Discharge: 2017-05-01 | Payer: PRIVATE HEALTH INSURANCE | Attending: Nurse Practitioner | Primary: Internal Medicine

## 2017-05-01 DIAGNOSIS — R569 Unspecified convulsions: Secondary | ICD-10-CM

## 2017-05-01 MED ORDER — DIVALPROEX 500 MG TAB, DELAYED RELEASE
500 mg | ORAL_TABLET | Freq: Two times a day (BID) | ORAL | 0 refills | Status: DC
Start: 2017-05-01 — End: 2017-05-29

## 2017-05-01 MED ORDER — GABAPENTIN 100 MG CAP
100 mg | ORAL_CAPSULE | ORAL | 0 refills | Status: DC
Start: 2017-05-01 — End: 2017-05-29

## 2017-05-01 NOTE — Progress Notes (Signed)
Chief Complaint   Patient presents with   ??? Neuropathy   ??? Results     EMG     1. Have you been to the ER, urgent care clinic since your last visit?  Hospitalized since your last visit? [facility] 03/22/17    2. Have you seen or consulted any other health care providers outside [name] since your last visit?  Include any pap smears or colon screening. no
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
## 2017-05-11 ENCOUNTER — Encounter: Attending: Nurse Practitioner | Primary: Internal Medicine

## 2017-05-11 ENCOUNTER — Ambulatory Visit: Admit: 2017-05-11 | Payer: PRIVATE HEALTH INSURANCE | Attending: Nurse Practitioner | Primary: Internal Medicine

## 2017-05-11 DIAGNOSIS — I10 Essential (primary) hypertension: Secondary | ICD-10-CM

## 2017-05-11 LAB — AMB POC LIPID PROFILE
Cholesterol (POC): 228
HDL Cholesterol (POC): 48
LDL Cholesterol (POC): 150 MG/DL
TChol/HDL Ratio (POC): 4.8
Triglycerides (POC): 152
VLDL (POC): 180 MG/DL

## 2017-05-11 MED ORDER — FLUTICASONE 50 MCG/ACTUATION NASAL SPRAY, SUSP
50 mcg/actuation | NASAL | 3 refills | Status: DC
Start: 2017-05-11 — End: 2017-07-18

## 2017-05-11 MED ORDER — METHYLPHENIDATE 5 MG TAB
5 mg | ORAL_TABLET | Freq: Every day | ORAL | 0 refills | Status: DC
Start: 2017-05-11 — End: 2017-12-31

## 2017-05-11 NOTE — Patient Instructions (Addendum)
High Cholesterol: Care Instructions  Your Care Instructions    Cholesterol is a type of fat in your blood. It is needed for many body functions, such as making new cells. Cholesterol is made by your body. It also comes from food you eat. High cholesterol means that you have too much of the fat in your blood. This raises your risk of a heart attack and stroke.  LDL and HDL are part of your total cholesterol. LDL is the "bad" cholesterol. High LDL can raise your risk for heart disease, heart attack, and stroke. HDL is the "good" cholesterol. It helps clear bad cholesterol from the body. High HDL is linked with a lower risk of heart disease, heart attack, and stroke.  Your cholesterol levels help your doctor find out your risk for having a heart attack or stroke. You and your doctor can talk about whether you need to lower your risk and what treatment is best for you.  A heart-healthy lifestyle along with medicines can help lower your cholesterol and your risk. The way you choose to lower your risk will depend on how high your risk is for heart attack and stroke. It will also depend on how you feel about taking medicines.  Follow-up care is a key part of your treatment and safety. Be sure to make and go to all appointments, and call your doctor if you are having problems. It's also a good idea to know your test results and keep a list of the medicines you take.  How can you care for yourself at home?  ?? Eat a variety of foods every day. Good choices include fruits, vegetables, whole grains (like oatmeal), dried beans and peas, nuts and seeds, soy products (like tofu), and fat-free or low-fat dairy products.  ?? Replace butter, margarine, and hydrogenated or partially hydrogenated oils with olive and canola oils. (Canola oil margarine without trans fat is fine.)  ?? Replace red meat with fish, poultry, and soy protein (like tofu).  ?? Limit processed and packaged foods like chips, crackers, and cookies.   ?? Bake, broil, or steam foods. Don't fry them.  ?? Be physically active. Get at least 30 minutes of exercise on most days of the week. Walking is a good choice. You also may want to do other activities, such as running, swimming, cycling, or playing tennis or team sports.  ?? Stay at a healthy weight or lose weight by making the changes in eating and physical activity listed above. Losing just a small amount of weight, even 5 to 10 pounds, can reduce your risk for having a heart attack or stroke.  ?? Do not smoke.  When should you call for help?  Watch closely for changes in your health, and be sure to contact your doctor if:  ?? ?? You need help making lifestyle changes.   ?? ?? You have questions about your medicine.   Where can you learn more?  Go to [email or web address].  Enter [phone] in the search box to learn more about "High Cholesterol: Care Instructions."  Current as of: Dec 22, 2015  Content Version: 11.7  ?? 2006-2018 [employer]. Care instructions adapted under license by [employer] (which disclaims liability or warranty for this information). If you have questions about a medical condition or this instruction, always ask your healthcare professional. [employer] any warranty or liability for your use of this information.       High Blood Pressure: Care Instructions  Your Care Instructions  If your blood pressure is usually above 130/80, you have high blood pressure, or hypertension. That means the top number is 130 or higher or the bottom number is 80 or higher, or both.  Despite what a lot of people think, high blood pressure usually doesn't cause headaches or make you feel dizzy or lightheaded. It usually has no symptoms. But it does increase your risk for heart attack, stroke, and kidney or eye damage. The higher your blood pressure, the more your risk increases.  Your doctor will give you a goal for your blood pressure. Your goal will  be based on your health and your age.  Lifestyle changes, such as eating healthy and being active, are always important to help lower blood pressure. You might also take medicine to reach your blood pressure goal.  Follow-up care is a key part of your treatment and safety. Be sure to make and go to all appointments, and call your doctor if you are having problems. It's also a good idea to know your test results and keep a list of the medicines you take.  How can you care for yourself at home?  Medical treatment  ?? If you stop taking your medicine, your blood pressure will go back up. You may take one or more types of medicine to lower your blood pressure. Be safe with medicines. Take your medicine exactly as prescribed. Call your doctor if you think you are having a problem with your medicine.  ?? Talk to your doctor before you start taking aspirin every day. Aspirin can help certain people lower their risk of a heart attack or stroke. But taking aspirin isn't right for everyone, because it can cause serious bleeding.  ?? See your doctor regularly. You may need to see the doctor more often at first or until your blood pressure comes down.  ?? If you are taking blood pressure medicine, talk to your doctor before you take decongestants or anti-inflammatory medicine, such as ibuprofen. Some of these medicines can raise blood pressure.  ?? Learn how to check your blood pressure at home.  Lifestyle changes  ?? Stay at a healthy weight. This is especially important if you put on weight around the waist. Losing even 10 pounds can help you lower your blood pressure.  ?? If your doctor recommends it, get more exercise. Walking is a good choice. Bit by bit, increase the amount you walk every day. Try for at least 30 minutes on most days of the week. You also may want to swim, bike, or do other activities.  ?? Avoid or limit alcohol. Talk to your doctor about whether you can drink any alcohol.   ?? Try to limit how much sodium you eat to less than 2,300 milligrams (mg) a day. Your doctor may ask you to try to eat less than 1,500 mg a day.  ?? Eat plenty of fruits (such as bananas and oranges), vegetables, legumes, whole grains, and low-fat dairy products.  ?? Lower the amount of saturated fat in your diet. Saturated fat is found in animal products such as milk, cheese, and meat. Limiting these foods may help you lose weight and also lower your risk for heart disease.  ?? Do not smoke. Smoking increases your risk for heart attack and stroke. If you need help quitting, talk to your doctor about stop-smoking programs and medicines. These can increase your chances of quitting for good.  When should you call for help?  Call 911 anytime you think  you may need emergency care. This may mean having symptoms that suggest that your blood pressure is causing a serious heart or blood vessel problem. Your blood pressure may be over 180/110.  ??For example, call 911 if:  ?? ?? You have symptoms of a heart attack. These may include:  ?? Chest pain or pressure, or a strange feeling in the chest.  ?? Sweating.  ?? Shortness of breath.  ?? Nausea or vomiting.  ?? Pain, pressure, or a strange feeling in the back, neck, jaw, or upper belly or in one or both shoulders or arms.  ?? Lightheadedness or sudden weakness.  ?? A fast or irregular heartbeat.   ?? ?? You have symptoms of a stroke. These may include:  ?? Sudden numbness, tingling, weakness, or loss of movement in your face, arm, or leg, especially on only one side of your body.  ?? Sudden vision changes.  ?? Sudden trouble speaking.  ?? Sudden confusion or trouble understanding simple statements.  ?? Sudden problems with walking or balance.  ?? A sudden, severe headache that is different from past headaches.   ?? ?? You have severe back or belly pain.   ??Do not wait until your blood pressure comes down on its own. Get help right away.  ??Call your doctor now or seek immediate care if:   ?? ?? Your blood pressure is much higher than normal (such as 180/110 or higher), but you don't have symptoms.   ?? ?? You think high blood pressure is causing symptoms, such as:  ?? Severe headache.  ?? Blurry vision.   ??Watch closely for changes in your health, and be sure to contact your doctor if:  ?? ?? Your blood pressure measures 140/90 or higher at least 2 times. That means the top number is 140 or higher or the bottom number is 90 or higher, or both.   ?? ?? You think you may be having side effects from your blood pressure medicine.   ?? ?? Your blood pressure is usually normal, but it goes above normal at least 2 times.   Where can you learn more?  Go to [employer].  Enter [id number] in the search box to learn more about "High Blood Pressure: Care Instructions."  Current as of: July 19, 2016  Content Version: 11.7  ?? 2006-2018 [employer]. Care instructions adapted under license by [employer] (which disclaims liability or warranty for this information). If you have questions about a medical condition or this instruction, always ask your healthcare professional. [employer] disclaims any warranty or liability for your use of this information.       Influenza (Flu) Vaccine: Care Instructions  Your Care Instructions    Influenza (flu) is an infection in the lungs and breathing passages. It is caused by the influenza virus. There are different strains, or types, of the flu virus every year. The flu comes on quickly. It can cause a cough, stuffy nose, fever, chills, tiredness, and aches and pains. These symptoms may last up to 10 days. The flu can make you feel very sick, but most of the time it doesn't lead to other problems. But it can cause serious problems in people who are older or who have a long-term illness, such as heart disease or diabetes.  You can help prevent the flu by getting a flu vaccine every year, as soon  as it is available. You cannot get the flu from the vaccine. The vaccine prevents most cases of  the flu. But even when the vaccine doesn't prevent the flu, it can make symptoms less severe and reduce the chance of problems from the flu.  Sometimes, young children and people who have an immune system problem may have a slight fever or muscle aches or pains 6 to 12 hours after getting the shot. These symptoms usually last 1 or 2 days.  Follow-up care is a key part of your treatment and safety. Be sure to make and go to all appointments, and call your doctor if you are having problems. It's also a good idea to know your test results and keep a list of the medicines you take.  Who should get the flu vaccine?  Everyone age 8 months or older should get a flu vaccine each year. It lowers the chance of getting and spreading the flu. The vaccine is very important for people who are at high risk for getting other health problems from the flu. This includes:  ?? Anyone 75 years of age or older.  ?? People who live in a long-term care center, such as a nursing home.  ?? All children 6 months through 37 years of age.  ?? Adults and children 6 months and older who have long-term heart or lung problems, such as asthma.  ?? Adults and children 6 months and older who needed medical care or were in a hospital during the past year because of diabetes, chronic kidney disease, or a weak immune system (including HIV or AIDS).  ?? Women who will be pregnant during the flu season.  ?? People who have any condition that can make it hard to breathe or swallow (such as a brain injury or muscle disorders).  ?? People who can give the flu to others who are at high risk for problems from the flu. This includes all health care workers and close contacts of people age 49 or older.  Who should not get the flu vaccine?  The person who gives the vaccine may tell you not to get it if you:  ?? Have a severe allergy to eggs or any part of the vaccine.   ?? Have had a severe reaction to a flu vaccine in the past.  ?? Have had Guillain-Barr?? syndrome (GBS).  ?? Are sick with a fever. (Get the vaccine when symptoms are gone.)  How can you care for yourself at home?  ?? If you or your child has a sore arm or a slight fever after the shot, take an over-the-counter pain medicine, such as acetaminophen (Tylenol) or ibuprofen (Advil, Motrin). Read and follow all instructions on the label. Do not give aspirin to anyone younger than 20. It has been linked to Reye syndrome, a serious illness.  ?? Do not take two or more pain medicines at the same time unless the doctor told you to. Many pain medicines have acetaminophen, which is Tylenol. Too much acetaminophen (Tylenol) can be harmful.  When should you call for help?  Call 911 anytime you think you may need emergency care. For example, call if after getting the flu vaccine:  ?? ?? You have symptoms of a severe reaction to the flu vaccine. Symptoms of a severe reaction may include:  ?? Severe difficulty breathing.  ?? Sudden raised, red areas (hives) all over your body.  ?? Severe lightheadedness.   ??Call your doctor now or seek immediate medical care if after getting the flu vaccine:  ?? ?? You think you are having a reaction to the  flu vaccine, such as a new fever.   ??Watch closely for changes in your health, and be sure to contact your doctor if you have any problems.  Where can you learn more?  Go to [employer].  Enter [phone] in the search box to learn more about "Influenza (Flu) Vaccine: Care Instructions."  Current as of: May 19, 2016  Content Version: 11.7  ?? 2006-2018 [employer]. Care instructions adapted under license by [employer] (which disclaims liability or warranty for this information). If you have questions about a medical condition or this instruction, always ask your healthcare professional. [employer]  [employer] disclaims any warranty or liability for your use of this information.       Type 2 Diabetes: Care Instructions  Your Care Instructions    Type 2 diabetes is a disease that develops when the body's tissues cannot use insulin properly. Over time, the pancreas cannot make enough insulin. Insulin is a hormone that helps the body's cells use sugar (glucose) for energy. It also helps the body store extra sugar in muscle, fat, and liver cells.  Without insulin, the sugar cannot get into the cells to do its work. It stays in the blood instead. This can cause high blood sugar levels. A person has diabetes when the blood sugar stays too high too much of the time. Over time, diabetes can lead to diseases of the heart, blood vessels, nerves, kidneys, and eyes.  You may be able to control your blood sugar by losing weight, eating a healthy diet, and getting daily exercise. You may also have to take insulin or other diabetes medicine.  Follow-up care is a key part of your treatment and safety. Be sure to make and go to all appointments. Call your doctor if you are having problems. It's also a good idea to know your test results and keep a list of the medicines you take.  How can you care for yourself at home?  ?? Keep your blood sugar at a target level (which you set with your doctor).  ?? Eat a good diet that spreads carbohydrate throughout the day. Carbohydrate-the body's main source of fuel-affects blood sugar more than any other nutrient. Carbohydrate is in fruits, vegetables, milk, and yogurt. It also is in breads, cereals, vegetables such as potatoes and corn, and sugary foods such as candy and cakes.  ?? Aim for 30 minutes of exercise on most, preferably all, days of the week. Walking is a good choice. You also may want to do other activities, such as running, swimming, cycling, or playing tennis or team sports. If your doctor says it's okay, do muscle-strengthening exercises at least 2 times a week.   ?? Take your medicines exactly as prescribed. Call your doctor if you think you are having a problem with your medicine. You will get more details on the specific medicines your doctor prescribes.  ?? Check your blood sugar as often as your doctor recommends. It is important to keep track of any symptoms you have, such as low blood sugar. Also tell your doctor if you have any changes in your activities, diet, or insulin use.  ?? Talk to your doctor before you start taking aspirin every day. Aspirin can help certain people lower their risk of a heart attack or stroke. But taking aspirin isn't right for everyone, because it can cause serious bleeding.  ?? Do not smoke. If you need help quitting, talk to your doctor about stop-smoking programs and medicines. These can increase  your chances of quitting for good.  ?? Keep your cholesterol and blood pressure at normal levels. You may need to take one or more medicines to reach your goals. Take them exactly as directed. Do not stop or change a medicine without talking to your doctor first.  When should you call for help?  Call 911 anytime you think you may need emergency care. For example, call if:  ?? ?? You passed out (lost consciousness), or you suddenly become very sleepy or confused. (You may have very low blood sugar.)   ??Call your doctor now or seek immediate medical care if:  ?? ?? Your blood sugar is 300 mg/dL or is higher than the level your doctor has set for you.   ?? ?? You have symptoms of low blood sugar, such as:  ?? Sweating.  ?? Feeling nervous, shaky, and weak.  ?? Extreme hunger and slight nausea.  ?? Dizziness and headache.  ?? Blurred vision.  ?? Confusion.   ??Watch closely for changes in your health, and be sure to contact your doctor if:  ?? ?? You often have problems controlling your blood sugar.   ?? ?? You have symptoms of long-term diabetes problems, such as:  ?? New vision changes.  ?? New pain, numbness, or tingling in your hands or feet.  ?? Skin problems.    Where can you learn more?  Go to [employer].  Enter C553 in the search box to learn more about "Type 2 Diabetes: Care Instructions."  Current as of: July 20, 2016  Content Version: 11.7  ?? 2006-2018 [employer]. Care instructions adapted under license by [employer] (which disclaims liability or warranty for this information). If you have questions about a medical condition or this instruction, always ask your healthcare professional. [employer] disclaims any warranty or liability for your use of this information.       Seizure: Care Instructions  Your Care Instructions    Seizures are caused by abnormal patterns of electrical signals in the brain. They are different for each person.  Seizures can affect movement, speech, vision, or awareness. Some people have only slight shaking of a hand and do not pass out. Other people may pass out and have violent shaking of the whole body. Some people appear to stare into space. They are awake, but they can't respond normally. Later, they may not remember what happened.  You may need tests to identify the type and cause of the seizures.  A seizure may occur only once, or you may have them more than one time. Taking medicines as directed and following up with your doctor may help keep you from having more seizures.  The doctor has checked you carefully, but problems can develop later. If you notice any problems or new symptoms, get medical treatment right away.  Follow-up care is a key part of your treatment and safety. Be sure to make and go to all appointments, and call your doctor if you are having problems. It's also a good idea to know your test results and keep a list of the medicines you take.  How can you care for yourself at home?  ?? Be safe with medicines. Take your medicines exactly as prescribed. Call your doctor if you think you are having a problem with your medicine.   ?? Do not do any activity that could be dangerous to you or others until your doctor says it is safe to do so. For example, do not drive  a car, operate machinery, swim, or climb ladders.  ?? Be sure that anyone treating you for any health problem knows that you have had a seizure and what medicines you are taking for it.  ?? Identify and avoid things that may make you more likely to have a seizure. These may include lack of sleep, alcohol or drug use, stress, or not eating.  ?? Make sure you go to your follow-up appointment.  When should you call for help?  Call 911 anytime you think you may need emergency care. For example, call if:  ?? ?? You have another seizure.   ?? ?? You have more than one seizure in 24 hours.   ?? ?? You have new symptoms, such as trouble walking, speaking, or thinking clearly.   ??Call your doctor now or seek immediate medical care if:  ?? ?? You are not acting normally.   ??Watch closely for changes in your health, and be sure to contact your doctor if you have any problems.  Where can you learn more?  Go to [employer].  Enter [phone] in the search box to learn more about "Seizure: Care Instructions."  Current as of: May 22, 2016  Content Version: 11.7  ?? 2006-2018 [employer]. Care instructions adapted under license by [employer] (which disclaims liability or warranty for this information). If you have questions about a medical condition or this instruction, always ask your healthcare professional. [employer] disclaims any warranty or liability for your use of this information.
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
## 2017-05-11 NOTE — Progress Notes (Signed)
Chief Complaint   Patient presents with   ??? Seizure     1. Have you been to the ER, urgent care clinic since your last visit?  Hospitalized since your last visit?No    2. Have you seen or consulted any other health care providers outside of [facility] since your last visit?  Include any pap smears or colon screening. No    PHQ over the last two weeks 05/01/2017   Little interest or pleasure in doing things Not at all   Feeling down, depressed, irritable, or hopeless Not at all   Total Score PHQ 2 0     Fall Risk Assessment, last 12 mths 05/01/2017   Able to walk? Yes   Fall in past 12 months? Yes   Fall with injury? No   Number of falls in past 12 months 2   Fall Risk Score 2     Abuse Screening Questionnaire 04/13/2017   Do you ever feel afraid of your partner? N   Are you in a relationship with someone who physically or mentally threatens you? N   Is it safe for you to go home? Y         Meghan [name] is a 71 y.o. female  who presents for routine immunizations.   He denies any symptoms , reactions or allergies that would exclude them from being immunized today.  Risks and adverse reactions were discussed and the VIS was given to them. All questions were addressed.  He was observed for 5 min post injection. There were no reactions observed.    Meghan [name]

## 2017-05-29 ENCOUNTER — Encounter

## 2017-05-29 MED ORDER — ASPIRIN 81 MG TAB, DELAYED RELEASE
81 mg | ORAL_TABLET | ORAL | 0 refills | Status: DC
Start: 2017-05-29 — End: 2017-07-18

## 2017-05-29 MED ORDER — GABAPENTIN 100 MG CAP
100 mg | ORAL_CAPSULE | ORAL | 0 refills | Status: DC
Start: 2017-05-29 — End: 2017-06-13

## 2017-05-29 MED ORDER — DIVALPROEX 500 MG TAB, DELAYED RELEASE
500 mg | ORAL_TABLET | ORAL | 0 refills | Status: DC
Start: 2017-05-29 — End: 2017-06-13

## 2017-06-03 ENCOUNTER — Encounter: Payer: Self-pay | Admitting: Neurology

## 2017-06-05 ENCOUNTER — Ambulatory Visit (INDEPENDENT_AMBULATORY_CARE_PROVIDER_SITE_OTHER): Payer: Medicare Other | Admitting: Neurology

## 2017-06-05 ENCOUNTER — Encounter: Payer: Self-pay | Admitting: Neurology

## 2017-06-05 VITALS — BP 138/80 | HR 80 | Ht 63.0 in | Wt 260.0 lb

## 2017-06-05 DIAGNOSIS — G4733 Obstructive sleep apnea (adult) (pediatric): Secondary | ICD-10-CM

## 2017-06-05 DIAGNOSIS — Z9989 Dependence on other enabling machines and devices: Secondary | ICD-10-CM | POA: Diagnosis not present

## 2017-06-05 NOTE — Patient Instructions (Signed)
Keep up the good work!   Please continue using your CPAP regularly. While your insurance requires that you use CPAP at least 4 hours each night on 70% of the nights, I recommend, that you not skip any nights and use it throughout the night if you can. Getting used to CPAP and staying with the treatment long term does take time and patience and discipline. Untreated obstructive sleep apnea when it is moderate to severe can have an adverse impact on cardiovascular health and raise her risk for heart disease, arrhythmias, hypertension, congestive heart failure, stroke and diabetes. Untreated obstructive sleep apnea causes sleep disruption, nonrestorative sleep, and sleep deprivation. This can have an impact on your day to day functioning and cause daytime sleepiness and impairment of cognitive function, memory loss, mood disturbance, and problems focussing. Using CPAP regularly can improve these symptoms.   
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
## 2017-06-13 ENCOUNTER — Ambulatory Visit: Admit: 2017-06-13 | Payer: PRIVATE HEALTH INSURANCE | Attending: Nurse Practitioner | Primary: Internal Medicine

## 2017-06-13 DIAGNOSIS — R7303 Prediabetes: Secondary | ICD-10-CM

## 2017-06-13 LAB — AMB POC LIPID PROFILE
Cholesterol (POC): 204
HDL Cholesterol (POC): 62
LDL Cholesterol (POC): 97 MG/DL
TChol/HDL Ratio (POC): 3.3
Triglycerides (POC): 226
VLDL (POC): 142 MG/DL

## 2017-06-13 LAB — AMB POC HEMOGLOBIN A1C: Hemoglobin A1c (POC): 5.4 %

## 2017-06-13 MED ORDER — DIVALPROEX 500 MG TAB, DELAYED RELEASE
500 mg | ORAL_TABLET | ORAL | 0 refills | Status: DC
Start: 2017-06-13 — End: 2017-07-31

## 2017-06-13 MED ORDER — GABAPENTIN 100 MG CAP
100 mg | ORAL_CAPSULE | ORAL | 0 refills | Status: DC
Start: 2017-06-13 — End: 2017-09-10

## 2017-06-13 MED ORDER — LISINOPRIL 5 MG TAB
5 mg | ORAL_TABLET | Freq: Every day | ORAL | 6 refills | Status: DC
Start: 2017-06-13 — End: 2017-07-11

## 2017-06-13 NOTE — Telephone Encounter (Signed)
Call Dr. Gweneth FritterSeabrook at the office or on her cell. Her cell is [phone].  Need to discuss patient
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
## 2017-06-13 NOTE — Patient Instructions (Addendum)
High Blood Pressure: Care Instructions  Your Care Instructions    If your blood pressure is usually above 130/80, you have high blood pressure, or hypertension. That means the top number is 130 or higher or the bottom number is 80 or higher, or both.  Despite what a lot of people think, high blood pressure usually doesn't cause headaches or make you feel dizzy or lightheaded. It usually has no symptoms. But it does increase your risk for heart attack, stroke, and kidney or eye damage. The higher your blood pressure, the more your risk increases.  Your doctor will give you a goal for your blood pressure. Your goal will be based on your health and your age.  Lifestyle changes, such as eating healthy and being active, are always important to help lower blood pressure. You might also take medicine to reach your blood pressure goal.  Follow-up care is a key part of your treatment and safety. Be sure to make and go to all appointments, and call your doctor if you are having problems. It's also a good idea to know your test results and keep a list of the medicines you take.  How can you care for yourself at home?  Medical treatment  ?? If you stop taking your medicine, your blood pressure will go back up. You may take one or more types of medicine to lower your blood pressure. Be safe with medicines. Take your medicine exactly as prescribed. Call your doctor if you think you are having a problem with your medicine.  ?? Talk to your doctor before you start taking aspirin every day. Aspirin can help certain people lower their risk of a heart attack or stroke. But taking aspirin isn't right for everyone, because it can cause serious bleeding.  ?? See your doctor regularly. You may need to see the doctor more often at first or until your blood pressure comes down.  ?? If you are taking blood pressure medicine, talk to your doctor before you take decongestants or anti-inflammatory medicine, such as ibuprofen.  Some of these medicines can raise blood pressure.  ?? Learn how to check your blood pressure at home.  Lifestyle changes  ?? Stay at a healthy weight. This is especially important if you put on weight around the waist. Losing even 10 pounds can help you lower your blood pressure.  ?? If your doctor recommends it, get more exercise. Walking is a good choice. Bit by bit, increase the amount you walk every day. Try for at least 30 minutes on most days of the week. You also may want to swim, bike, or do other activities.  ?? Avoid or limit alcohol. Talk to your doctor about whether you can drink any alcohol.  ?? Try to limit how much sodium you eat to less than 2,300 milligrams (mg) a day. Your doctor may ask you to try to eat less than 1,500 mg a day.  ?? Eat plenty of fruits (such as bananas and oranges), vegetables, legumes, whole grains, and low-fat dairy products.  ?? Lower the amount of saturated fat in your diet. Saturated fat is found in animal products such as milk, cheese, and meat. Limiting these foods may help you lose weight and also lower your risk for heart disease.  ?? Do not smoke. Smoking increases your risk for heart attack and stroke. If you need help quitting, talk to your doctor about stop-smoking programs and medicines. These can increase your chances of quitting for good.  When   should you call for help?  Call 911 anytime you think you may need emergency care. This may mean having symptoms that suggest that your blood pressure is causing a serious heart or blood vessel problem. Your blood pressure may be over 180/120.  ??For example, call 911 if:  ?? ?? You have symptoms of a heart attack. These may include:  ? Chest pain or pressure, or a strange feeling in the chest.  ? Sweating.  ? Shortness of breath.  ? Nausea or vomiting.  ? Pain, pressure, or a strange feeling in the back, neck, jaw, or upper belly or in one or both shoulders or arms.  ? Lightheadedness or sudden weakness.   ? A fast or irregular heartbeat.   ?? ?? You have symptoms of a stroke. These may include:  ? Sudden numbness, tingling, weakness, or loss of movement in your face, arm, or leg, especially on only one side of your body.  ? Sudden vision changes.  ? Sudden trouble speaking.  ? Sudden confusion or trouble understanding simple statements.  ? Sudden problems with walking or balance.  ? A sudden, severe headache that is different from past headaches.   ?? ?? You have severe back or belly pain.   ??Do not wait until your blood pressure comes down on its own. Get help right away.  ??Call your doctor now or seek immediate care if:  ?? ?? Your blood pressure is much higher than normal (such as 180/120 or higher), but you don't have symptoms.   ?? ?? You think high blood pressure is causing symptoms, such as:  ? Severe headache.  ? Blurry vision.   ??Watch closely for changes in your health, and be sure to contact your doctor if:  ?? ?? Your blood pressure measures higher than your doctor recommends at least 2 times. That means the top number is higher or the bottom number is higher, or both.   ?? ?? You think you may be having side effects from your blood pressure medicine.   Where can you learn more?  Go to [employer]cahttp://www.healthwise.net/[employer]ctions.  Enter [phone] in the search box to learn more about "High Blood Pressure: Care Instructions."  Current as of: July 19, 2016  Content Version: 11.8  ?? 2006-2018 [employer]. Care instructions adapted under license by [employer] (which disclaims liability or warranty for this information). If you have questions about a medical condition or this instruction, always ask your healthcare professional. [employer] disclaims any warranty or liability for your use of this information.         Seizure: Care Instructions  Your Care Instructions    Seizures are caused by abnormal patterns of electrical signals in the brain. They are different for each person.   Seizures can affect movement, speech, vision, or awareness. Some people have only slight shaking of a hand and do not pass out. Other people may pass out and have violent shaking of the whole body. Some people appear to stare into space. They are awake, but they can't respond normally. Later, they may not remember what happened.  You may need tests to identify the type and cause of the seizures.  A seizure may occur only once, or you may have them more than one time. Taking medicines as directed and following up with your doctor may help keep you from having more seizures.  The doctor has checked you carefully, but problems can develop later. If you notice any problems or new  symptoms, get medical treatment right away.  Follow-up care is a key part of your treatment and safety. Be sure to make and go to all appointments, and call your doctor if you are having problems. It's also a good idea to know your test results and keep a list of the medicines you take.  How can you care for yourself at home?  ?? Be safe with medicines. Take your medicines exactly as prescribed. Call your doctor if you think you are having a problem with your medicine.  ?? Do not do any activity that could be dangerous to you or others until your doctor says it is safe to do so. For example, do not drive a car, operate machinery, swim, or climb ladders.  ?? Be sure that anyone treating you for any health problem knows that you have had a seizure and what medicines you are taking for it.  ?? Identify and avoid things that may make you more likely to have a seizure. These may include lack of sleep, alcohol or drug use, stress, or not eating.  ?? Make sure you go to your follow-up appointment.  When should you call for help?  Call 911 anytime you think you may need emergency care. For example, call if:  ?? ?? You have another seizure.   ?? ?? You have more than one seizure in 24 hours.    ?? ?? You have new symptoms, such as trouble walking, speaking, or thinking clearly.   ??Call your doctor now or seek immediate medical care if:  ?? ?? You are not acting normally.   ??Watch closely for changes in your health, and be sure to contact your doctor if you have any problems.  Where can you learn more?  Go to [employer]cahttp://www.healthwise.net/[employer]Help[employer]ctions.  Enter (716)755-7852M769 in the search box to learn more about "Seizure: Care Instructions."  Current as of: January 15, 2017  Content Version: 11.8  ?? 2006-2018 [employer]. Care instructions adapted under license by [employer] (which disclaims liability or warranty for this information). If you have questions about a medical condition or this instruction, always ask your healthcare professional. [employer] disclaims any warranty or liability for your use of this information.         Altered Mental Status: Care Instructions  Your Care Instructions    Altered mental status is a change in how well your brain is working. As a result, you may be confused, be less alert than usual, or act in odd ways. This may include seeing or hearing things that aren't really there (hallucinations).  A mental status change has many possible causes. For example, it may be the result of an infection, an imbalance of chemicals in the body, or a chronic disease such as diabetes or COPD. It can also be caused by things such as a head injury, taking certain medicines, or using alcohol or drugs.  The doctor may do tests to look for the cause. These tests may include urine tests, blood tests, and imaging tests such as a CT scan. Sometimes a clear cause isn't found. But tests can help the doctor rule out a serious cause of your symptoms.  A change in mental status can be scary. But mental status will often return to normal when the cause is treated. So it is important to get any follow-up testing or treatment the doctor has suggested.   The doctor has checked you carefully, but problems can develop later. If you notice any problems  or new symptoms, get medical treatment right away.  Follow-up care is a key part of your treatment and safety. Be sure to make and go to all appointments, and call your doctor if you are having problems. It's also a good idea to know your test results and keep a list of the medicines you take.  How can you care for yourself at home?  ?? Be safe with medicines. Take your medicines exactly as prescribed. Call your doctor if you think you are having a problem with your medicine.  ?? Have another adult stay with you until you are better. This can help keep you safe. Ask that person to watch for signs that your mental status is getting worse.  When should you call for help?  Call 911 anytime you think you may need emergency care. For example, call if:  ?? ?? You passed out (lost consciousness).   ??Call your doctor now or seek immediate medical care if:  ?? ?? Your mental status is getting worse.   ?? ?? You have new symptoms, such as a fever, chills, or shortness of breath.   ?? ?? You do not feel safe.   ??Watch closely for changes in your health, and be sure to contact your doctor if:  ?? ?? You do not get better as expected.   Where can you learn more?  Go to [employer].  Enter ([phone] in the search box to learn more about "Altered Mental Status: Care Instructions."  Current as of: January 15, 2017  Content Version: 11.8  ?? 2006-2018 [employer]. Care instructions adapted under license by [employer] (which disclaims liability or warranty for this information). If you have questions about a medical condition or this instruction, always ask your healthcare professional. [employer] disclaims any warranty or liability for your use of this information.

## 2017-06-18 ENCOUNTER — Emergency Department: Admit: 2017-06-18 | Payer: MEDICARE | Primary: Internal Medicine

## 2017-06-18 ENCOUNTER — Inpatient Hospital Stay
Admit: 2017-06-18 | Discharge: 2017-06-19 | Disposition: A | Discharge status: Skilled Nursing Facility | Payer: MEDICARE | Attending: Emergency Medicine

## 2017-06-18 ENCOUNTER — Observation Stay

## 2017-06-18 DIAGNOSIS — G9340 Encephalopathy, unspecified: Secondary | ICD-10-CM

## 2017-06-18 LAB — CBC WITH AUTOMATED DIFF
ABS. BASOPHILS: 0.1 10*3/uL (ref 0.0–0.1)
ABS. EOSINOPHILS: 0.1 10*3/uL (ref 0.0–0.4)
ABS. IMM. GRANS.: 0 10*3/uL (ref 0.00–0.04)
ABS. LYMPHOCYTES: 3.2 10*3/uL (ref 0.8–3.5)
ABS. MONOCYTES: 1 10*3/uL (ref 0.0–1.0)
ABS. NEUTROPHILS: 2.7 10*3/uL (ref 1.8–8.0)
ABSOLUTE NRBC: 0 10*3/uL (ref 0.00–0.01)
BASOPHILS: 1 % (ref 0–1)
EOSINOPHILS: 2 % (ref 0–7)
HCT: 41.2 % (ref 35.0–47.0)
HGB: 13.5 g/dL (ref 11.5–16.0)
IMMATURE GRANULOCYTES: 0 % (ref 0.0–0.5)
LYMPHOCYTES: 45 % (ref 12–49)
MCH: 31.2 PG (ref 26.0–34.0)
MCHC: 32.8 g/dL (ref 30.0–36.5)
MCV: 95.2 FL (ref 80.0–99.0)
MONOCYTES: 14 % — ABNORMAL HIGH (ref 5–13)
MPV: 10.5 FL (ref 8.9–12.9)
NEUTROPHILS: 39 % (ref 32–75)
NRBC: 0 PER 100 WBC
PLATELET: 433 10*3/uL — ABNORMAL HIGH (ref 150–400)
RBC: 4.33 M/uL (ref 3.80–5.20)
RDW: 15.4 % — ABNORMAL HIGH (ref 11.5–14.5)
WBC: 7 10*3/uL (ref 3.6–11.0)

## 2017-06-18 LAB — SAMPLES BEING HELD

## 2017-06-18 LAB — GLUCOSE, POC
Glucose (POC): 100 mg/dL (ref 65–100)
Glucose (POC): 83 mg/dL (ref 65–100)

## 2017-06-18 MED ORDER — IOPAMIDOL 76 % IV SOLN
370 mg iodine /mL (76 %) | Freq: Once | INTRAVENOUS | Status: AC
Start: 2017-06-18 — End: 2017-06-18
  Administered 2017-06-18: 23:00:00 via INTRAVENOUS

## 2017-06-18 MED ORDER — LORAZEPAM 2 MG/ML IJ SOLN
2 mg/mL | INTRAMUSCULAR | Status: AC
Start: 2017-06-18 — End: 2017-06-18
  Administered 2017-06-18: via INTRAVENOUS

## 2017-06-18 MED ORDER — SODIUM CHLORIDE 0.9 % IJ SYRG
Freq: Once | INTRAMUSCULAR | Status: AC
Start: 2017-06-18 — End: 2017-06-18
  Administered 2017-06-18: 23:00:00 via INTRAVENOUS

## 2017-06-18 MED ORDER — SODIUM CHLORIDE 0.9 % IV
Freq: Once | INTRAVENOUS | Status: AC
Start: 2017-06-18 — End: 2017-06-18
  Administered 2017-06-18: 23:00:00 via INTRAVENOUS

## 2017-06-18 MED FILL — LORAZEPAM 2 MG/ML IJ SOLN: 2 mg/mL | INTRAMUSCULAR | Qty: 1

## 2017-06-18 NOTE — Other (Signed)
Waiting on Family to do MRI Safety Screening
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
## 2017-06-18 NOTE — ED Notes (Signed)
Assumed care of pt from Lisa RocaAshley K, RN at bedside with verbal report consisting of Situation, Background, Assessment, and Recommendations (SBAR).  Pt is A&O x 4.     Pt resting comfortably on the stretcher in a position of comfort.  Call bell within reach.  Side rails x 2.  Cardiac monitor x 3.  Stretcher locked in the lowest position. Concerns and questions addressed at this time.  Pt in no acute distress at this the time.  Will continue to monitor.

## 2017-06-18 NOTE — ED Notes (Signed)
TRANSFER - OUT REPORT:    Verbal report given to [name], RN on Meghan [name]  being transferred to [id number], Neuro for routine progression of care       Report consisted of patient???s Situation, Background, Assessment and   Recommendations(SBAR).     Information from the following report(s) SBAR, Kardex, ED Summary, [facility] and Recent Results was reviewed with the receiving nurse.    Lines:   Peripheral IV 06/18/17 Right Antecubital (Active)   Site Assessment Clean, dry, & intact 06/18/2017  7:23 PM   Phlebitis Assessment 0 06/18/2017  7:23 PM   Infiltration Assessment 0 06/18/2017  7:23 PM   Dressing Status Clean, dry, & intact 06/18/2017  7:23 PM   Dressing Type Transparent;Tape 06/18/2017  7:23 PM   Hub Color/Line Status Pink 06/18/2017  7:23 PM        Opportunity for questions and clarification was provided.      Patient transported with:   [employer]

## 2017-06-18 NOTE — ED Notes (Addendum)
Pt ambulatory t the ER with family.  Per daughter, pt has been confused x today, last seen normal at 0700.  Pt is currently A&Ox4 but is talking randomly.  Daughter states pt has been intermittently confused and having difficulty getting her words out and "not making sense". Pt placed on monitor x 3
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
## 2017-06-18 NOTE — ED Notes (Signed)
Tele-neuro moved into the room, Dr. Letitia LibraJohnston to eval pt

## 2017-06-18 NOTE — ED Notes (Signed)
Attempted to call report to neuro. RN in impatient Code S at the moment and will call back.
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
## 2017-06-18 NOTE — H&P (Signed)
Recent Results (from the past 24 hour(s))   GLUCOSE, POC    Collection Time: 06/18/17  5:37 PM   Result Value Ref Range    Glucose (POC) 100 65 - 100 mg/dL    Performed by [place]ar Haw    GLUCOSE, POC    Collection Time: 06/18/17  5:46 PM   Result Value Ref Range    Glucose (POC) 83 65 - 100 mg/dL    Performed by [place], COMPREHENSIVE    Collection Time: 06/18/17  5:51 PM   Result Value Ref Range    Sodium 142 136 - 145 mmol/L    Potassium 3.4 (L) 3.5 - 5.1 mmol/L    Chloride 105 97 - 108 mmol/L    CO2 26 21 - 32 mmol/L    Anion gap 11 5 - 15 mmol/L    Glucose 82 65 - 100 mg/dL    BUN 23 (H) 6 - 20 MG/DL    Creatinine 1.41 (H) 0.55 - 1.02 MG/DL    BUN/Creatinine ratio 16 12 - 20      GFR est AA 45 (L) >60 ml/min/1.21m    GFR est non-AA 37 (L) >60 ml/min/1.767m   Calcium 9.3 8.5 - 10.1 MG/DL    Bilirubin, total 0.5 0.2 - 1.0 MG/DL    ALT (SGPT) 51 12 - 78 U/L    AST (SGOT) 59 (H) 15 - 37 U/L    Alk. phosphatase 50 45 - 117 U/L    Protein, total 7.6 6.4 - 8.2 g/dL    Albumin 3.7 3.5 - 5.0 g/dL    Globulin 3.9 2.0 - 4.0 g/dL    A-G Ratio 0.9 (L) 1.1 - 2.2     CBC WITH AUTOMATED DIFF    Collection Time: 06/18/17  5:51 PM   Result Value Ref Range    WBC 7.0 3.6 - 11.0 K/uL    RBC 4.33 3.80 - 5.20 M/uL    HGB 13.5 11.5 - 16.0 g/dL    HCT 41.2 35.0 - 47.0 %    MCV 95.2 80.0 - 99.0 FL    MCH 31.2 26.0 - 34.0 PG    MCHC 32.8 30.0 - 36.5 g/dL    RDW 15.4 (H) 11.5 - 14.5 %    PLATELET 433 (H) 150 - 400 K/uL    MPV 10.5 8.9 - 12.9 FL    NRBC 0.0 0 PER 100 WBC    ABSOLUTE NRBC 0.00 0.00 - 0.01 K/uL    NEUTROPHILS 39 32 - 75 %    LYMPHOCYTES 45 12 - 49 %    MONOCYTES 14 (H) 5 - 13 %    EOSINOPHILS 2 0 - 7 %    BASOPHILS 1 0 - 1 %    IMMATURE GRANULOCYTES 0 0.0 - 0.5 %    ABS. NEUTROPHILS 2.7 1.8 - 8.0 K/UL    ABS. LYMPHOCYTES 3.2 0.8 - 3.5 K/UL    ABS. MONOCYTES 1.0 0.0 - 1.0 K/UL    ABS. EOSINOPHILS 0.1 0.0 - 0.4 K/UL    ABS. BASOPHILS 0.1 0.0 - 0.1 K/UL    ABS. IMM. GRANS. 0.0 0.00 - 0.04 K/UL     DF AUTOMATED     TROPONIN I    Collection Time: 06/18/17  5:51 PM   Result Value Ref Range    Troponin-I, Qt. <0.05 <0.05 ng/mL   CK W/ REFLX CKMB    Collection Time: 06/18/17  5:51 PM   Result  Value Ref Range    CK 95 26 - 192 U/L   SAMPLES BEING HELD    Collection Time: 06/18/17  5:51 PM   Result Value Ref Range    SAMPLES BEING HELD 1BLUE     COMMENT        Add-on orders for these samples will be processed based on acceptable specimen integrity and analyte stability, which may vary by analyte.   ETHYL ALCOHOL    Collection Time: 06/18/17  5:52 PM   Result Value Ref Range    ALCOHOL(ETHYL),SERUM <10 <10 MG/DL   EKG, 12 LEAD, INITIAL    Collection Time: 06/18/17  6:42 PM   Result Value Ref Range    Ventricular Rate 88 BPM    Atrial Rate 88 BPM    P-R Interval 134 ms    QRS Duration 68 ms    Q-T Interval 362 ms    QTC Calculation (Bezet) 438 ms    Calculated P Axis 42 degrees    Calculated R Axis 16 degrees    Calculated T Axis 26 degrees    Diagnosis       Normal sinus rhythm  Normal ECG  When compared with ECG of 22-Mar-2017 21:25,  No significant change was found     VALPROIC ACID    Collection Time: 06/18/17  7:45 PM   Result Value Ref Range    Valproic acid 62 50 - 100 ug/ml

## 2017-06-18 NOTE — ED Notes (Signed)
Teleneurologist at bedside evaluating pt

## 2017-06-18 NOTE — ED Notes (Signed)
Pt ambulatory t the ER with family.  Per daughter, pt has been confused x today.  Pt is currently A&Ox3.  Daughter states pt has been intermittently confused and having difficulty getting her words out. Pt placed on monitor x 3

## 2017-06-18 NOTE — Other (Signed)
-  Please complete MRI History and Safety Screening Form for this patient using KARDEX only under Orders Requiring a Screening Form:    Example:  Orders Requiring a Screening Form     Procedure Order Status Form Status    MRI EXAM Active In Progress     - Answer all questions completely, including Weight, Surgery, Allergy, and Implant History.   - Document MUST be "eSigned" using a "Signature Pad" by the person completing form, the patient, and the RN or MD completing form with the patient.  - Patient cannot be scanned until this form is completed and reviewed in MRI to ensure patient is SAFE and eligible for MRI.  - CALL MRI when this has been successfully completed at [phone].  - This must be done under KARDEX. Do not use the Nursing Flowsheets.

## 2017-06-19 ENCOUNTER — Observation Stay: Admit: 2017-06-20 | Payer: MEDICARE | Primary: Internal Medicine

## 2017-06-19 LAB — URINALYSIS W/ REFLEX CULTURE
Bacteria: NEGATIVE /hpf
Bilirubin: NEGATIVE
Blood: NEGATIVE
Glucose: NEGATIVE mg/dL
Ketone: NEGATIVE mg/dL
Leukocyte Esterase: NEGATIVE
Nitrites: NEGATIVE
Protein: NEGATIVE mg/dL
Specific gravity: 1.005 (ref 1.003–1.030)
Urobilinogen: 1 EU/dL (ref 0.2–1.0)
pH (UA): 7 (ref 5.0–8.0)

## 2017-06-19 LAB — DRUG SCREEN, URINE
AMPHETAMINES: NEGATIVE
BARBITURATES: NEGATIVE
BENZODIAZEPINES: NEGATIVE
COCAINE: NEGATIVE
METHADONE: NEGATIVE
OPIATES: NEGATIVE
PCP(PHENCYCLIDINE): NEGATIVE
THC (TH-CANNABINOL): NEGATIVE

## 2017-06-19 LAB — METABOLIC PANEL, COMPREHENSIVE
A-G Ratio: 0.9 — ABNORMAL LOW (ref 1.1–2.2)
ALT (SGPT): 51 U/L (ref 12–78)
AST (SGOT): 59 U/L — ABNORMAL HIGH (ref 15–37)
Albumin: 3.7 g/dL (ref 3.5–5.0)
Alk. phosphatase: 50 U/L (ref 45–117)
Anion gap: 11 mmol/L (ref 5–15)
BUN/Creatinine ratio: 16 (ref 12–20)
BUN: 23 MG/DL — ABNORMAL HIGH (ref 6–20)
Bilirubin, total: 0.5 MG/DL (ref 0.2–1.0)
CO2: 26 mmol/L (ref 21–32)
Calcium: 9.3 MG/DL (ref 8.5–10.1)
Chloride: 105 mmol/L (ref 97–108)
Creatinine: 1.41 MG/DL — ABNORMAL HIGH (ref 0.55–1.02)
GFR est AA: 45 mL/min/{1.73_m2} — ABNORMAL LOW (ref >60)
GFR est non-AA: 37 mL/min/{1.73_m2} — ABNORMAL LOW (ref >60)
Globulin: 3.9 g/dL (ref 2.0–4.0)
Glucose: 82 mg/dL (ref 65–100)
Potassium: 3.4 mmol/L — ABNORMAL LOW (ref 3.5–5.1)
Protein, total: 7.6 g/dL (ref 6.4–8.2)
Sodium: 142 mmol/L (ref 136–145)

## 2017-06-19 LAB — TSH 3RD GENERATION: TSH: 0.53 u[IU]/mL (ref 0.36–3.74)

## 2017-06-19 LAB — VALPROIC ACID: Valproic acid: 62 ug/ml (ref 50–100)

## 2017-06-19 LAB — METABOLIC PANEL, BASIC
Anion gap: 9 mmol/L (ref 5–15)
BUN/Creatinine ratio: 17 (ref 12–20)
BUN: 19 MG/DL (ref 6–20)
CO2: 26 mmol/L (ref 21–32)
Calcium: 8.9 MG/DL (ref 8.5–10.1)
Chloride: 108 mmol/L (ref 97–108)
Creatinine: 1.1 MG/DL — ABNORMAL HIGH (ref 0.55–1.02)
GFR est AA: 60 mL/min/{1.73_m2} — ABNORMAL LOW (ref >60)
GFR est non-AA: 49 mL/min/{1.73_m2} — ABNORMAL LOW (ref >60)
Glucose: 103 mg/dL — ABNORMAL HIGH (ref 65–100)
Potassium: 3.7 mmol/L (ref 3.5–5.1)
Sodium: 143 mmol/L (ref 136–145)

## 2017-06-19 LAB — TROPONIN I: Troponin-I, Qt.: <0.05 ng/mL (ref <0.05)

## 2017-06-19 LAB — CK W/ REFLX CKMB: CK: 95 U/L (ref 26–192)

## 2017-06-19 LAB — ETHYL ALCOHOL: ALCOHOL(ETHYL),SERUM: <10 MG/DL (ref <10)

## 2017-06-19 MED ORDER — CITALOPRAM 20 MG TAB
20 mg | Freq: Every day | ORAL | Status: DC
Start: 2017-06-19 — End: 2017-06-20
  Administered 2017-06-19 – 2017-06-20 (×2): via ORAL

## 2017-06-19 MED ORDER — GABAPENTIN 100 MG CAP
100 mg | Freq: Three times a day (TID) | ORAL | Status: DC
Start: 2017-06-19 — End: 2017-06-19

## 2017-06-19 MED ORDER — FENOFIBRATE NANOCRYSTALLIZED 48 MG TAB
48 mg | Freq: Every day | ORAL | Status: DC
Start: 2017-06-19 — End: 2017-06-20
  Administered 2017-06-19 – 2017-06-20 (×2): via ORAL

## 2017-06-19 MED ORDER — SODIUM CHLORIDE 0.9 % IJ SYRG
INTRAMUSCULAR | Status: DC | PRN
Start: 2017-06-19 — End: 2017-06-20
  Administered 2017-06-20: 09:00:00 via INTRAVENOUS

## 2017-06-19 MED ORDER — LISINOPRIL 5 MG TAB
5 mg | Freq: Every day | ORAL | Status: DC
Start: 2017-06-19 — End: 2017-06-19

## 2017-06-19 MED ORDER — NS WITH POTASSIUM CHLORIDE 20 MEQ/L IV
20 mEq/L | INTRAVENOUS | Status: DC
Start: 2017-06-19 — End: 2017-06-20
  Administered 2017-06-19 – 2017-06-20 (×2): via INTRAVENOUS

## 2017-06-19 MED ORDER — GABAPENTIN 100 MG CAP
100 mg | Freq: Two times a day (BID) | ORAL | Status: DC
Start: 2017-06-19 — End: 2017-06-20
  Administered 2017-06-19 – 2017-06-20 (×2): via ORAL

## 2017-06-19 MED ORDER — SODIUM CHLORIDE 0.9 % IJ SYRG
Freq: Three times a day (TID) | INTRAMUSCULAR | Status: DC
Start: 2017-06-19 — End: 2017-06-20
  Administered 2017-06-18 – 2017-06-20 (×5): via INTRAVENOUS

## 2017-06-19 MED ORDER — ENOXAPARIN 40 MG/0.4 ML SUB-Q SYRINGE
40 mg/0.4 mL | SUBCUTANEOUS | Status: DC
Start: 2017-06-19 — End: 2017-06-20
  Administered 2017-06-19 – 2017-06-20 (×2): via SUBCUTANEOUS

## 2017-06-19 MED ORDER — METHYLPHENIDATE 5 MG TAB
5 mg | Freq: Every day | ORAL | Status: DC
Start: 2017-06-19 — End: 2017-06-20
  Administered 2017-06-19 – 2017-06-20 (×2): via ORAL

## 2017-06-19 MED ORDER — DIVALPROEX 250 MG TAB, DELAYED RELEASE
250 mg | Freq: Two times a day (BID) | ORAL | Status: DC
Start: 2017-06-19 — End: 2017-06-20
  Administered 2017-06-19 – 2017-06-20 (×3): via ORAL

## 2017-06-19 MED ORDER — CHOLECALCIFEROL (VITAMIN D3) 1,000 UNIT (25 MCG) TAB
Freq: Every day | ORAL | Status: DC
Start: 2017-06-19 — End: 2017-06-20
  Administered 2017-06-19 – 2017-06-20 (×2): via ORAL

## 2017-06-19 MED ORDER — ASPIRIN 81 MG CHEWABLE TAB
81 mg | Freq: Every day | ORAL | Status: DC
Start: 2017-06-19 — End: 2017-06-20
  Administered 2017-06-19 – 2017-06-20 (×2): via ORAL

## 2017-06-19 MED FILL — NS WITH POTASSIUM CHLORIDE 20 MEQ/L IV: 20 mEq/L | INTRAVENOUS | Qty: 1000

## 2017-06-19 MED FILL — BD POSIFLUSH NORMAL SALINE 0.9 % INJECTION SYRINGE: INTRAMUSCULAR | Qty: 10

## 2017-06-19 MED FILL — TRICOR 48 MG TABLET: 48 mg | ORAL | Qty: 1

## 2017-06-19 MED FILL — CITALOPRAM 20 MG TAB: 20 mg | ORAL | Qty: 1

## 2017-06-19 MED FILL — CHILDREN'S ASPIRIN 81 MG CHEWABLE TABLET: 81 mg | ORAL | Qty: 1

## 2017-06-19 MED FILL — ENOXAPARIN 40 MG/0.4 ML SUB-Q SYRINGE: 40 mg/0.4 mL | SUBCUTANEOUS | Qty: 0.4

## 2017-06-19 MED FILL — GABAPENTIN 100 MG CAP: 100 mg | ORAL | Qty: 1

## 2017-06-19 MED FILL — DEPAKOTE 250 MG TABLET,DELAYED RELEASE: 250 mg | ORAL | Qty: 2

## 2017-06-19 MED FILL — METHYLPHENIDATE 5 MG TAB: 5 mg | ORAL | Qty: 1

## 2017-06-19 MED FILL — VITAMIN D3 25 MCG (1,000 UNIT) TABLET: 25 mcg (1,000 unit) | ORAL | Qty: 1
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
## 2017-06-19 NOTE — Progress Notes (Signed)
Reason for Admission:   Altered Mental Status               RRAT Score:     27 - high risk              Resources/supports as identified by patient/family:   Good support from daughter and sister                Top Challenges facing patient (as identified by patient/family and CM):                       Finances/Medication cost?   No issues - has Medicare and Medicaid                 Transportation?  Pt's daughter and sister drive pt              Support system or lack thereof?  Family supportive                      Living arrangements?   Lives alone in a 2 story home with steps             Self-care/ADLs/Cognition?  Independent with her ADL's          Current Advanced Directive/Advance Care Plan:  Full Code                          Plan for utilizing home health:    Pending therapy's recommendation                       Likelihood of readmission:  moderate                 Transition of Care Plan:    F/u appts and pending therapy recommendations    Pt is a 71 y.o african [place] female admitted with Altered Mental Status. Pt was unable to give this CM information due to confusion. CM called pt's sister, since pt's daughter changed her contact number. Pt's sister will try and get pt's daughter new contact number. Pt's sister verified demographic information and all is correct. Pt lives alone in a 2 story home with steps. Pt's sister stated that pt has been falling lately and they have been trying to get her an apt that is 1 story. Denies using DME or having rehab. [facility] was set up at that time per chart review. CM will continue to follow for discharge planning needs.     Care Management Interventions  PCP Verified by CM: Yes(Dr. Gweneth FritterSeabrook )  Mode of Transport at Discharge: Other (see comment)(family can transport by car)  Transition of Care Consult (CM Consult): Discharge Planning  Discharge Durable Medical Equipment: No   Physical Therapy Consult: Yes  Occupational Therapy Consult: Yes  Speech Therapy Consult: No  Current Support Network: Lives Alone, Own Home(lives alone in a 2 story home with steps to the entrance)  [facility]    Christine M Breidenbaugh, RN  [phone]
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
## 2017-06-19 NOTE — Procedures (Signed)
[facility]  EEG    Name:Meghan [name], Meghan J.  MR#: [id number]  DOB: 01/19/1946  ACCOUNT #: [id number]   DATE OF SERVICE: 06/19/2017    CLINICAL INDICATION:  The patient is a 71-year-old female with a history of memory loss, patient with episode of confusion.  EEG to rule out encephalopathy, rule out cortical abnormality.  EEG to rule out seizures.  Patient with known history of seizures on medication, Depakote.  EEG to rule out epilepsy.    ELECTROENCEPHALOGRAM CLASSIFICATION:  Dysrhythmia grade II, generalized.    DESCRIPTION OF THE RECORD:  The background of this recording contains a posteriorly located occipital alpha rhythm of 8-9 Hz that does attenuate some with eye opening.  Throughout the recording, there was a mild to moderate increase in some generalized 2-6 Hz activity seen without clear areas of focal slowing or spike or spike and wave discharges seen.  The patient did enter brief states of drowsiness and then brief stages of sleep with K complexes and sleep spindles seen in the central head regions.  No recorded spells of any type identified.  No focal slowing, no spike and spike and wave discharges seen in the record and no recorded spells of any type.  Hyperventilation was not performed.  Photic stimulation produced a minimal driving response.    INTERPRETATION:  This is a mildly to moderately abnormal electroencephalogram due to the generalized slowing seen most consistent with a diffuse encephalopathy of toxic, metabolic or degenerative type.  No clear focal process or epileptiform activity was seen.  Clinical correlation recommended.      Shyana Kulakowski A. Hansini Clodfelter, MD       TAS / MN  D: 06/19/2017 14:18     T: 06/19/2017 21:04  JOB #: [id number]  CC: Harlea Goetzinger MD

## 2017-06-19 NOTE — Progress Notes (Signed)
Pt is AMS and unable to answer mri screen questions. Will try to contact family

## 2017-06-19 NOTE — Progress Notes (Signed)
Pt is very confused unable to answer my questions to complete the admission database and the MRI screening form. Called the pts daughter [name] with the number provided by the ED. No answer and unable to leave voice message.

## 2017-06-19 NOTE — Progress Notes (Signed)
Hospitalist Progress Note  NAME: Meghan FuchsBarbara J [name]   DOB:  Sep 21, 1945   MRN:  [id number]       Assessment / Plan:  Acute Encephalopathy/AMS POA- suspected Post ictal state?  H/o seizure disorder  CT head no acute findings  CTA H&N per CVA protocol- negative   Valproic acid levels 62  Recent MRI unremarkable- last admission for rhabdo/syncope  Recent Echo normal EF    Recheck Brain MRI per teleneurology consult recommendations in ER  EEG, cont Depakote at home doses for now, pt use to be on Vimpat till last admission/DC--  ? taken off in neuro office on follow up/  IP Neurology consult awaited for further recommendations  Cont ASA 81 daily as at home  Further workup as/if recommended by neurology  ??  AKI POA- Cr 1.4  Hypokalemia POA- mild, 3.4  Cont IVF with K+ for now  Holding Lisinopril for now  Decreasing Neurontin to BID till Cr normalizes  Check BMP this AM as ordered    HTN- Well controlled,   continue current meds and adjust as needed  ??  Diabetes type 2 with Neuropathy- controlled  HbA1c 5.4  Was taken off metformin last admission  Diet control for now  Cont gabapentin at lower dose till Cr improves    Depression/Anixety  Cont home psych meds  ??  Code Status:??full  Surrogate Decision Maker: daughter  ??  DVT Prophylaxis:??yes  GI Prophylaxis: not indicated  ??  Baseline:??lives at home with ?daughter nearby    Recommended Disposition: SNF/LTC and HH PT, OT, RN?? TBD     Subjective:     Chief Complaint / Reason for Physician Visit: AMS, Seizure?, Post concussion syndrome?  "I am fine".  Discussed with RN events overnight.     Review of Systems:  Symptom Y/N Comments  Symptom Y/N Comments   Fever/Chills    Chest Pain     Poor Appetite    Edema     Cough    Abdominal Pain     Sputum    Joint Pain     SOB/DOE    Pruritis/Rash     Nausea/vomit    Tolerating PT/OT     Diarrhea    Tolerating Diet     Constipation    Other       Could NOT obtain due to: AMS     Objective:     VITALS:    Last 24hrs VS reviewed since prior progress note. Most recent are:  Patient Vitals for the past 24 hrs:   Temp Pulse Resp BP SpO2   06/19/17 0804 97.7 ??F (36.5 ??C) 70 16 110/56 97 %   06/19/17 0402 97.2 ??F (36.2 ??C) 72 18 111/61 95 %   06/18/17 2341 97.4 ??F (36.3 ??C) 69 20 107/61 100 %   06/18/17 2300 ??? 74 16 105/64 97 %   06/18/17 2200 97.6 ??F (36.4 ??C) 70 18 104/65 96 %   06/18/17 2100 ??? 76 17 102/66 97 %   06/18/17 1900 ??? 89 19 107/87 99 %   06/18/17 1738 97.7 ??F (36.5 ??C) 93 18 122/80 96 %       Intake/Output Summary (Last 24 hours) at 06/19/2017 0925  Last data filed at 06/19/2017 0420  Gross per 24 hour   Intake 100 ml   Output ???   Net 100 ml        PHYSICAL EXAM:  General: WD, WN. Alert, cooperative, no acute distress????  EENT:  EOMI. Anicteric sclerae. MMM  Resp:  CTA bilaterally, no wheezing or rales.  No accessory muscle use  CV:  Regular  rhythm,?? No edema  GI:  Soft, Non distended, Non tender. ??+Bowel sounds  Neurologic:?? Alert and oriented X1, hesitant speech noted +, no motor deficit noted  Psych:???? Good insight.??Not anxious nor agitated  Skin:  No rashes.  No jaundice    Reviewed most current lab test results and cultures  YES  Reviewed most current radiology test results   YES  Review and summation of old records today    NO  Reviewed patient's current orders and MAR    YES  PMH/SH reviewed - no change compared to H&P  ________________________________________________________________________  Care Plan discussed with:    Comments   Patient x    Family      RN x    Care Manager x    Consultant                        Multidiciplinary team rounds were held today with case manager, nursing, pharmacist and Higher education careers adviserclinical coordinator.  Patient's plan of care was discussed; medications were reviewed and discharge planning was addressed.     ________________________________________________________________________  Total NON critical care TIME:  36   Minutes     Total CRITICAL CARE TIME Spent:   Minutes non procedure based      Comments   >50% of visit spent in counseling and coordination of care     ________________________________________________________________________  Carmine SavoyParishram B Felesia Stahlecker, MD     Procedures: see electronic medical records for all procedures/Xrays and details which were not copied into this note but were reviewed prior to creation of Plan.      LABS:  I reviewed today's most current labs and imaging studies.  Pertinent labs include:  Recent Labs     06/18/17  1751   WBC 7.0   HGB 13.5   HCT 41.2   PLT 433*     Recent Labs     06/18/17  1751   NA 142   K 3.4*   CL 105   CO2 26   GLU 82   BUN 23*   CREA 1.41*   CA 9.3   ALB 3.7   TBILI 0.5   SGOT 59*   ALT 51       Signed: Carmine SavoyParishram B Zayed Griffie, MD

## 2017-06-19 NOTE — Progress Notes (Signed)
Attempted to call two family members regarding mri screen. Both numbers are disconnected. Will try and see if a family member comes to visit
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
## 2017-06-19 NOTE — Procedures (Signed)
[facility]    Name:[name], Meghan LullBARBARA J.  MR#: [id number]  DOB: 05/30/1946  ACCOUNT #: [id number]   DATE OF SERVICE: 06/19/2017    CLINICAL INDICATION:  The patient is a 71 year old female with a history of memory loss, patient with episode of confusion.  EEG to rule out encephalopathy, rule out cortical abnormality.  EEG to rule out seizures.  Patient with known history of seizures on medication, Depakote.  EEG to rule out epilepsy.    ELECTROENCEPHALOGRAM CLASSIFICATION:  Dysrhythmia grade II, generalized.    DESCRIPTION OF THE RECORD:  The background of this recording contains a posteriorly located occipital alpha rhythm of 8-9 Hz that does attenuate some with eye opening.  Throughout the recording, there was a mild to moderate increase in some generalized 2-6 Hz activity seen without clear areas of focal slowing or spike or spike and wave discharges seen.  The patient did enter brief states of drowsiness and then brief stages of sleep with K complexes and sleep spindles seen in the central head regions.  No recorded spells of any type identified.  No focal slowing, no spike and spike and wave discharges seen in the record and no recorded spells of any type.  Hyperventilation was not performed.  Photic stimulation produced a minimal driving response.    INTERPRETATION:  This is a mildly to moderately abnormal electroencephalogram due to the generalized slowing seen most consistent with a diffuse encephalopathy of toxic, metabolic or degenerative type.  No clear focal process or epileptiform activity was seen.  Clinical correlation recommended.      Youlanda RoysHOMAS A. Oday Ridings, MD       TAS / [place]  D: 06/19/2017 14:18     T: 06/19/2017 21:04  JOB #: [id number]  CC: Jettie PaganHOMAS Malayiah Mcbrayer MD

## 2017-06-20 LAB — VITAMIN B12: Vitamin B12: 1157 pg/mL — ABNORMAL HIGH (ref 193–986)

## 2017-06-20 LAB — FOLATE: Folate: 14.1 ng/mL (ref 5.0–21.0)

## 2017-06-20 LAB — METABOLIC PANEL, BASIC
Anion gap: 8 mmol/L (ref 5–15)
BUN/Creatinine ratio: 18 (ref 12–20)
BUN: 19 MG/DL (ref 6–20)
CO2: 22 mmol/L (ref 21–32)
Calcium: 8.8 MG/DL (ref 8.5–10.1)
Chloride: 112 mmol/L — ABNORMAL HIGH (ref 97–108)
Creatinine: 1.03 MG/DL — ABNORMAL HIGH (ref 0.55–1.02)
GFR est AA: >60 mL/min/{1.73_m2} (ref >60)
GFR est non-AA: 53 mL/min/{1.73_m2} — ABNORMAL LOW (ref >60)
Glucose: 58 mg/dL — ABNORMAL LOW (ref 65–100)
Potassium: 4.6 mmol/L (ref 3.5–5.1)
Sodium: 142 mmol/L (ref 136–145)

## 2017-06-20 LAB — CBC W/O DIFF
ABSOLUTE NRBC: 0 10*3/uL (ref 0.00–0.01)
HCT: 40.7 % (ref 35.0–47.0)
HGB: 12.7 g/dL (ref 11.5–16.0)
MCH: 31 PG (ref 26.0–34.0)
MCHC: 31.2 g/dL (ref 30.0–36.5)
MCV: 99.3 FL — ABNORMAL HIGH (ref 80.0–99.0)
MPV: 10.7 FL (ref 8.9–12.9)
NRBC: 0 PER 100 WBC
PLATELET: 396 10*3/uL (ref 150–400)
RBC: 4.1 M/uL (ref 3.80–5.20)
RDW: 15.6 % — ABNORMAL HIGH (ref 11.5–14.5)
WBC: 5.6 10*3/uL (ref 3.6–11.0)

## 2017-06-20 LAB — MAGNESIUM: Magnesium: 1.9 mg/dL (ref 1.6–2.4)

## 2017-06-20 LAB — SAMPLES BEING HELD

## 2017-06-20 LAB — EKG, 12 LEAD, INITIAL
Atrial Rate: 88 {beats}/min
Calculated P Axis: 42 degrees
Calculated R Axis: 16 degrees
Calculated T Axis: 26 degrees
Diagnosis: NORMAL
P-R Interval: 134 ms
Q-T Interval: 362 ms
QRS Duration: 68 ms
QTC Calculation (Bezet): 438 ms
Ventricular Rate: 88 {beats}/min

## 2017-06-20 LAB — SED RATE (ESR): Sed rate, automated: 5 mm/hr (ref 0–30)

## 2017-06-20 LAB — CK: CK: 159 U/L (ref 26–192)

## 2017-06-20 LAB — EKG 12-LEAD
Atrial Rate: 88 {beats}/min
Diagnosis: NORMAL
P Axis: 42 degrees
P-R Interval: 134 ms
Q-T Interval: 362 ms
QRS Duration: 68 ms
QTc Calculation (Bazett): 438 ms
R Axis: 16 degrees
T Axis: 26 degrees
Ventricular Rate: 88 {beats}/min

## 2017-06-20 MED FILL — BD POSIFLUSH NORMAL SALINE 0.9 % INJECTION SYRINGE: INTRAMUSCULAR | Qty: 10

## 2017-06-20 MED FILL — TRICOR 48 MG TABLET: 48 mg | ORAL | Qty: 1

## 2017-06-20 MED FILL — CITALOPRAM 20 MG TAB: 20 mg | ORAL | Qty: 1

## 2017-06-20 MED FILL — CHILDREN'S ASPIRIN 81 MG CHEWABLE TABLET: 81 mg | ORAL | Qty: 1

## 2017-06-20 MED FILL — METHYLPHENIDATE 5 MG TAB: 5 mg | ORAL | Qty: 1

## 2017-06-20 MED FILL — VITAMIN D3 25 MCG (1,000 UNIT) TABLET: 25 mcg (1,000 unit) | ORAL | Qty: 1

## 2017-06-20 MED FILL — DEPAKOTE 250 MG TABLET,DELAYED RELEASE: 250 mg | ORAL | Qty: 2

## 2017-06-20 MED FILL — ENOXAPARIN 40 MG/0.4 ML SUB-Q SYRINGE: 40 mg/0.4 mL | SUBCUTANEOUS | Qty: 0.4

## 2017-06-20 MED FILL — GABAPENTIN 100 MG CAP: 100 mg | ORAL | Qty: 1

## 2017-06-20 NOTE — Progress Notes (Signed)
TRANSFER - OUT REPORT:    Verbal report given to Meghan [name](name) on Meghan [name]  being transferred to [facility](unit) for routine progression of care       Report consisted of patient???s Situation, Background, Assessment and   Recommendations(SBAR).     Information from the following report(s) SBAR, Kardex, Recent Results and Med Rec Status was reviewed with the receiving nurse.    Lines:       Opportunity for questions and clarification was provided.      Patient transported with:
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
## 2017-06-20 NOTE — Progress Notes (Addendum)
CM met with pt to discuss discharge plans and pt was confused. Pt couldn't remember her daughter's contact number. CM called pt's sister and informed her of pt discharging today and will need HH with 24/7 or a SNF. Pt's sister was working and will be in at 11:30am to discuss. Pt's sister did not have pt's daughter's phone number.     12pm - CM met with pt and pt's sister and discussed SNF's. Provided them with the SNF list and pt's sister chose [facility]. FOC form signed and referral sent via connect care.     Cherrie Gauze, [place]

## 2017-06-20 NOTE — Progress Notes (Signed)
Bedside and Verbal shift change report given to Jess,rn (oncoming nurse) by lisa,rn (offgoing nurse). Report included the following information SBAR, Kardex, Intake/Output, MAR and Recent Results.

## 2017-06-20 NOTE — Discharge Summary (Signed)
Hospitalist Discharge Summary     Patient ID:  Meghan [name]  [id number]  71 y.o.  [birth year]    PCP on record: Gwynn BurlySeabrook, Berna A, NP    Admit date: 06/18/2017  Discharge date and time: 06/20/2017      DISCHARGE DIAGNOSIS:    Acute Encephalopathy/AMS POA- resolved now  Likely due to AKI/dehydration POA- now resolved s/p IVF  suspected Post ictal state POA- ruled out as EEG negative & MRI unremarkable, seen by Neurology & cleared, no change in Anti Seizure meds  H/o seizure disorder - cont depakote  Hypokalemia POA- mild, 3.4- now resolved  HTN  DM  Depression      CONSULTATIONS:  IP CONSULT TO NEUROLOGY    Excerpted HPI from H&P of Freida Busmaniego F Martinez-Vasquez, MD:  "71 y.o.????PMH HTN, seizures, who presents to the ED because by her daughter last time she was"ok" was today at &:30 am. Apparently the patient has been "Off" all day, not clear if she had a seizure or not . Difficult to get a good history since there are no family members in the room.  ??  We were asked to admit for work up and evaluation of the above problem"    ______________________________________________________________________  DISCHARGE SUMMARY/HOSPITAL COURSE:  for full details see H&P, daily progress notes, labs, consult notes.   Acute Encephalopathy/AMS POA- suspected Post ictal state?  H/o seizure disorder  CT head no acute findings  CTA H&N per CVA protocol- negative   Valproic acid levels 62  Recent MRI unremarkable- last admission for rhabdo/syncope  Recent Echo normal EF  ??  Recheck Brain MRI per teleneurology consult recommendations in ER  EEG, cont Depakote at home doses for now, pt use to be on Vimpat till last admission/DC--  ? taken off in neuro office on follow up/  IP Neurology consult awaited for further recommendations  Cont ASA 81 daily as at home  Further workup as/if recommended by neurology  ??  AKI POA- Cr 1.4  Hypokalemia POA- mild, 3.4  Cont IVF with K+ for now  Holding Lisinopril for now   Decreasing Neurontin to BID till Cr normalizes  Check BMP this AM as ordered  ??  HTN- Well controlled,   continue current meds and adjust as needed  ??  Diabetes type 2 with Neuropathy- controlled  HbA1c 5.4  Was taken off metformin last admission  Diet control for now  Cont gabapentin at lower dose till Cr improves  ??  Depression/Anixety  Cont home psych meds  ??  Code Status:??full  Surrogate Decision Maker: daughter        _______________________________________________________________________  Patient seen and examined by me on discharge day.  Pertinent Findings:  Gen:    Not in distress  Chest: Clear lungs  CVS:   Regular rhythm.  No edema  Abd:  Soft, not distended, not tender  Neuro:  Alert, oriented x 3  _______________________________________________________________________  DISCHARGE MEDICATIONS:   Current Discharge Medication List      CONTINUE these medications which have NOT CHANGED    Details   lisinopril (PRINIVIL, ZESTRIL) 5 mg tablet Take 1 Tab by mouth daily.  Qty: 30 Tab, Refills: 6    Associated Diagnoses: Essential hypertension      divalproex DR (DEPAKOTE) 500 mg tablet take 1 tablet by mouth twice a day  Qty: 60 Tab, Refills: 0    Associated Diagnoses: Seizure (HCC); Memory difficulty; Poor short term memory      gabapentin (NEURONTIN) 100  mg capsule take 1 capsule by mouth three times a day  Qty: 90 Cap, Refills: 0    Associated Diagnoses: Seizure (HCC); Memory difficulty; Poor short term memory; Neuropathy      aspirin delayed-release 81 mg tablet take 1 tablet by mouth once daily  Qty: 30 Tab, Refills: 0      methylphenidate HCl (RITALIN) 5 mg tablet Take 1 Tab (5 mg total) by mouth dailyEarliest Fill Date: 05/11/17.  Max Daily Amount: 5 mg  Qty: 30 Tab, Refills: 0    Associated Diagnoses: MCI (mild cognitive impairment); Post-traumatic brain syndrome; Mild cognitive impairment      fluticasone (FLONASE) 50 mcg/actuation nasal spray 2 sprays each nostril daily  Qty: 1 Bottle, Refills: 3     Associated Diagnoses: Environmental and seasonal allergies      fenofibrate (LOFIBRA) 54 mg tablet Take 1 Tab by mouth daily.  Qty: 30 Tab, Refills: 3    Associated Diagnoses: Hypercholesteremia      OTHER CK  Qty: 1 Act, Refills: 0    Comments: Send the result to Dr. Debbrah AlarSeabrook's office  Associated Diagnoses: Elevated CPK      citalopram (CELEXA) 20 mg tablet Take 1 Tab by mouth daily.  Qty: 30 Tab, Refills: 3    Associated Diagnoses: Depression, unspecified depression type      cholecalciferol (VITAMIN D3) 1,000 unit cap take 1 capsule by mouth once daily  Qty: 30 Cap, Refills: 0      garlic extract 161600 mg tab Take 1,000 mg by mouth daily.      SUMAtriptan (IMITREX) 100 mg tablet 1 tab at onset of moderate-severe migraine; may repeat 1 tab in 2 hours; Limit: 2 tabs in 24/ hrs, not more than 3 days a week  Qty: 12 Tab, Refills: 3      ondansetron (ZOFRAN ODT) 4 mg disintegrating tablet Take 1 Tab by mouth every eight (8) hours as needed for Nausea.  Qty: 10 Tab, Refills: 0      naloxone (NARCAN) 4 mg/actuation nasal spray Use 1 spray intranasally into 1 nostril. Use a new Narcan nasal spray for subsequent doses and administer into alternating nostrils. May repeat every 2 to 3 minutes as needed.  Qty: 1 Each, Refills: 0      aspirin 81 mg chewable tablet Take 81 mg by mouth daily.             My Recommended Diet, Activity, Wound Care, and follow-up labs are listed in the patient's Discharge Insturctions which I have personally completed and reviewed.    ______________________________________________________________________    Risk of deterioration: Low    Condition at Discharge:  Stable  ______________________________________________________________________    Disposition  SNF/LTC  ______________________________________________________________________    Care Plan discussed with:   Patient, Family, RN, Care Manager, Consultant    ______________________________________________________________________     Code Status: Full Code  ______________________________________________________________________      Follow up with:   PCP : Gwynn BurlySeabrook, Berna A, NP  Follow-up Information     Follow up With Specialties Details Why Contact Info    Gwynn BurlySeabrook, Berna A, NP Nurse Practitioner   [address]  [place] [id number]  [phone]                Total time in minutes spent coordinating this discharge (includes going over instructions, follow-up, prescriptions, and preparing report for sign off to her PCP) :  26 minutes    Signed:  Carmine SavoyParishram B Aashvi Rezabek, MD
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
## 2017-06-20 NOTE — Discharge Summary (Signed)
Discharge Summary by Carmine SavoyPatel, Michial Disney B, MD at 06/20/17 1218                Author: Carmine SavoyPatel, Merril Isakson B, MD  Service: Internal Medicine  Author Type: Physician       Filed: 06/20/17 1221  Date of Service: 06/20/17 1218  Status: Signed          Editor: Carmine SavoyPatel, Jessiah Wojnar B, MD (Physician)                                       Hospitalist Discharge Summary        Patient ID:   Meghan [name]   [id number]   71 y.o.   1945/12/06      PCP on record: Gwynn BurlySeabrook, Berna A, NP      Admit date: 06/18/2017   Discharge date and time: 06/20/2017         DISCHARGE DIAGNOSIS:      Acute Encephalopathy/AMS POA- resolved now   Likely due to AKI/dehydration POA- now resolved s/p IVF   suspected Post ictal state POA- ruled out as EEG negative & MRI unremarkable, seen by Neurology & cleared, no change in Anti Seizure  meds   H/o seizure disorder - cont depakote   Hypokalemia POA- mild, 3.4- now resolved   HTN   DM   Depression         CONSULTATIONS:   IP CONSULT TO NEUROLOGY      Excerpted HPI from H&P of Freida Busmaniego F Martinez-Vasquez, MD:   "71 y.o.????PMH HTN, seizures, who presents to the ED because by her daughter last time she was"ok" was today at &:30 am. Apparently the patient has been "Off" all  day, not clear if she had a seizure or not . Difficult to get a good history since there are no family members in the room.   ??   We were asked to admit for work up and evaluation of the above problem"      ______________________________________________________________________   DISCHARGE SUMMARY/HOSPITAL COURSE:   for full details see H&P, daily progress notes, labs, consult notes.       Acute Encephalopathy/AMS POA- suspected Post ictal  state?   H/o seizure disorder   CT head no acute findings   CTA H&N per CVA protocol- negative    Valproic acid levels 62   Recent MRI unremarkable- last admission for rhabdo/syncope   Recent Echo normal EF   ??   Recheck Brain MRI per teleneurology consult recommendations in ER   EEG, cont Depakote at  home doses for now, pt use to be on Vimpat till last admission/DC--  ? taken off in neuro office on follow up/   IP Neurology consult awaited for further recommendations   Cont ASA 81 daily as at home   Further workup as/if recommended by neurology   ??   AKI POA- Cr 1.4   Hypokalemia POA- mild, 3.4   Cont IVF with K+ for now   Holding Lisinopril for now   Decreasing Neurontin to BID till Cr normalizes   Check BMP this AM as ordered   ??   HTN- Well controlled,    continue current meds and adjust as needed   ??   Diabetes type 2 with Neuropathy- controlled   HbA1c 5.4   Was taken off metformin last admission   Diet control for now   Cont gabapentin at  lower dose till Cr improves   ??   Depression/Anixety   Cont home psych meds   ??   Code Status:??full   Surrogate Decision Maker: daughter            _______________________________________________________________________   Patient seen and examined by me on discharge day.   Pertinent Findings:   Gen:    Not in distress   Chest: Clear lungs   CVS:   Regular rhythm.  No edema   Abd:  Soft, not distended, not tender   Neuro:  Alert, oriented x 3   _______________________________________________________________________   DISCHARGE MEDICATIONS:      Current Discharge Medication List              CONTINUE these medications which have NOT CHANGED          Details        lisinopril (PRINIVIL, ZESTRIL) 5 mg tablet  Take 1 Tab by mouth daily.   Qty: 30 Tab, Refills:  6          Associated Diagnoses: Essential hypertension               divalproex DR (DEPAKOTE) 500 mg tablet  take 1 tablet by mouth twice a day   Qty: 60 Tab, Refills:  0          Associated Diagnoses: Seizure (HCC); Memory difficulty; Poor short term memory               gabapentin (NEURONTIN) 100 mg capsule  take 1 capsule by mouth three times a day   Qty: 90 Cap, Refills:  0          Associated Diagnoses: Seizure (HCC); Memory difficulty; Poor short term memory; Neuropathy               aspirin delayed-release 81 mg  tablet  take 1 tablet by mouth once daily   Qty: 30 Tab, Refills:  0               methylphenidate HCl (RITALIN) 5 mg tablet  Take 1 Tab (5 mg total) by mouth dailyEarliest Fill Date: 05/11/17.  Max Daily Amount: 5 mg   Qty: 30 Tab, Refills:  0          Associated Diagnoses: MCI (mild cognitive impairment); Post-traumatic brain syndrome; Mild cognitive  impairment               fluticasone (FLONASE) 50 mcg/actuation nasal spray  2 sprays each nostril daily   Qty: 1 Bottle, Refills:  3          Associated Diagnoses: Environmental and seasonal allergies               fenofibrate (LOFIBRA) 54 mg tablet  Take 1 Tab by mouth daily.   Qty: 30 Tab, Refills:  3          Associated Diagnoses: Hypercholesteremia               OTHER  CK   Qty: 1 Act, Refills:  0          Comments: Send the result to Dr. Debbrah Alar office   Associated Diagnoses: Elevated CPK               citalopram (CELEXA) 20 mg tablet  Take 1 Tab by mouth daily.   Qty: 30 Tab, Refills:  3          Associated Diagnoses: Depression, unspecified depression type  cholecalciferol (VITAMIN D3) 1,000 unit cap  take 1 capsule by mouth once daily   Qty: 30 Cap, Refills:  0               garlic extract 161600 mg tab  Take 1,000 mg by mouth daily.               SUMAtriptan (IMITREX) 100 mg tablet  1 tab at onset of moderate-severe migraine; may repeat 1 tab in 2 hours; Limit: 2 tabs in 24/ hrs, not more than 3 days a week   Qty: 12 Tab, Refills:  3               ondansetron (ZOFRAN ODT) 4 mg disintegrating tablet  Take 1 Tab by mouth every eight (8) hours as needed for Nausea.   Qty: 10 Tab, Refills:  0               naloxone (NARCAN) 4 mg/actuation nasal spray  Use 1 spray intranasally into 1 nostril. Use a new Narcan nasal spray for subsequent doses and administer into alternating nostrils. May repeat every 2  to 3 minutes as needed.   Qty: 1 Each, Refills:  0               aspirin 81 mg chewable tablet  Take 81 mg by mouth daily.                         My  Recommended Diet, Activity, Wound Care, and follow-up labs are listed in the patient's Discharge Insturctions which I have personally completed and reviewed.      ______________________________________________________________________      Risk of deterioration:  Low      Condition at Discharge:  Stable   ______________________________________________________________________      Disposition   SNF/LTC   ______________________________________________________________________      Care Plan discussed with:    Patient, Family, RN, Care Manager, Consultant      ______________________________________________________________________      Code Status: Full Code   ______________________________________________________________________         Follow up with:    PCP : Gwynn BurlySeabrook, Berna A, NP     Follow-up Information               Follow up With  Specialties  Details  Why  Contact Info              Gwynn BurlySeabrook, Berna A, NP  Nurse Practitioner      [address]   [place] [id number]   [phone]                         Total time in minutes spent coordinating this discharge (includes going over instructions, follow-up, prescriptions, and preparing report for sign off to her PCP) :  26 minutes      Signed:   Carmine SavoyParishram B Seynabou Fults, MD

## 2017-06-21 LAB — ANA QL, W/REFLEX CASCADE
ANA DIRECT, 165188: NEGATIVE
ANA Direct: NEGATIVE

## 2017-07-01 LAB — VITAMIN D, 25 HYROXY PANEL
Vit D 25-Hydroxy: 42 ng/mL
Vit D-2 25-Hydroxy: <1 ng/mL
Vit D-3 25-Hydroxy: 42 ng/mL

## 2017-07-10 ENCOUNTER — Emergency Department (HOSPITAL_COMMUNITY)
Admission: EM | Admit: 2017-07-10 | Discharge: 2017-07-10 | Disposition: A | Discharge status: Home / Self Care | Payer: Medicare Other | Attending: Emergency Medicine | Admitting: Emergency Medicine

## 2017-07-10 ENCOUNTER — Other Ambulatory Visit: Payer: Self-pay

## 2017-07-10 ENCOUNTER — Encounter (HOSPITAL_COMMUNITY): Payer: Self-pay

## 2017-07-10 DIAGNOSIS — T07XXXA Unspecified multiple injuries, initial encounter: Secondary | ICD-10-CM

## 2017-07-10 DIAGNOSIS — Z87891 Personal history of nicotine dependence: Secondary | ICD-10-CM | POA: Diagnosis not present

## 2017-07-10 DIAGNOSIS — Z043 Encounter for examination and observation following other accident: Secondary | ICD-10-CM | POA: Insufficient documentation

## 2017-07-10 DIAGNOSIS — Z7982 Long term (current) use of aspirin: Secondary | ICD-10-CM | POA: Diagnosis not present

## 2017-07-10 DIAGNOSIS — I10 Essential (primary) hypertension: Secondary | ICD-10-CM | POA: Insufficient documentation

## 2017-07-10 DIAGNOSIS — Z79899 Other long term (current) drug therapy: Secondary | ICD-10-CM | POA: Insufficient documentation

## 2017-07-10 NOTE — ED Triage Notes (Signed)
[facility] and [facility] on the wet floor.Patient states she landed on her buttocks. Patient c/o right lower back pain, right shoulder and right lateral knee pain. Patient denies LOC.

## 2017-07-10 NOTE — ED Provider Notes (Signed)
[facility]-EMERGENCY DEPT Provider Note   CSN: [id number] Arrival date & time: 07/10/17  1512     History   Chief Complaint No chief complaint on file.   HPI Angelica Mcconnell is a 71 y.o. female.  Patient fell while at a [employer], stating "I turned around and fell."  She presents complaining of pain in the right shoulder, right upper and lower arm, mid lower lumbar region, and the right leg.  She was ambulatory at scene and came here by private vehicle.  She denies headache, chest pain, shortness of breath, weakness, paresthesia, dizziness.  There are no other known modifying factors.  HPI  Past Medical History:  Diagnosis Date  . Gout   . HTN (hypertension)   . OSA (obstructive sleep apnea) 07/15/2013  . Reflux   . Sinus infection     Patient Active Problem List   Diagnosis Date Noted  . Diverticulosis of colon without hemorrhage   . OSA (obstructive sleep apnea) 07/15/2013    Past Surgical History:  Procedure Laterality Date  . COLONOSCOPY    . COLONOSCOPY [name] 08/31/2014   Procedure: COLONOSCOPY;  Surgeon: Corbin Adeobert M Rourk, MD;  Location: [facility];  Service: Endoscopy;  Laterality: [name];  8:30 AM    OB History    No data available       Home Medications    Prior to Admission medications   Medication Sig Start Date End Date Taking? Authorizing Provider  allopurinol (ZYLOPRIM) 100 MG tablet Take 1 tablet by mouth daily. 05/07/13   [provider]  amLODipine (NORVASC) 5 MG tablet Take 5 mg by mouth daily.    [provider]  aspirin EC 81 MG tablet Take 81 mg by mouth daily.    [provider]  Calcium Carbonate-Vitamin D (CALTRATE 600+D PO) Take 1 tablet by mouth daily.     [provider]  fluticasone (FLONASE) 50 MCG/ACT nasal spray Place 1-2 sprays into both nostrils daily. 06/06/13   [provider]  irbesartan (AVAPRO) 300 MG tablet Take 1 tablet by mouth daily. 07/09/13    [provider]  omeprazole (PRILOSEC) 20 MG capsule Take 20 mg by mouth daily as needed. Takes only as needed 05/16/13   [provider]    Family History Family History  Problem Relation Age of Onset  . Heart failure Mother   . Diabetes Mother   . Thyroid disease Mother     Social History Social History   Tobacco Use  . Smoking status: Former Smoker    Last attempt to quit: 07/15/1989    Years since quitting: 28.0  . Smokeless tobacco: Never Used  Substance Use Topics  . Alcohol use: No  . Drug use: No     Allergies   Patient has no known allergies.   Review of Systems Review of Systems  All other systems reviewed and are negative.    Physical Exam Updated Vital Signs BP (!) 143/84 (BP Location: Right Arm)   Pulse 85   Temp 97.9 F (36.6 C) (Oral)   Resp 18   Ht 5\' 5"  (1.651 m)   Wt 117 kg (258 lb)   SpO2 99%   BMI 42.93 kg/m   Physical Exam  Constitutional: She is oriented to person, place, and time. She appears well-developed and well-nourished.  HENT:  Head: Normocephalic and atraumatic.  Eyes: Conjunctivae and EOM are normal. Pupils are equal, round, and reactive to light.  Neck: Normal range  of motion and phonation normal. Neck supple.  Cardiovascular: Normal rate.  Pulmonary/Chest: Effort normal. She exhibits no tenderness.  Musculoskeletal: Normal range of motion. She exhibits no tenderness or deformity.  Normal gait  Neurological: She is alert and oriented to person, place, and time. She exhibits normal muscle tone.  No dysarthria, aphasia or nystagmus.  Skin: Skin is warm and dry.  Psychiatric: She has a normal mood and affect. Her behavior is normal. Judgment and thought content normal.  Nursing note and vitals reviewed.    ED Treatments / Results  Labs (all labs ordered are listed, but only abnormal results are displayed) Labs Reviewed - No data to display  EKG  EKG Interpretation None       Radiology No  results found.  Procedures Procedures (including critical care time)  Medications Ordered in ED Medications - No data to display   Initial Impression / Assessment and Plan / ED Course  I have reviewed the triage vital signs and the nursing notes.  Pertinent labs & imaging results that were available during my care of the patient were reviewed by me and considered in my medical decision making (see chart for details).      Patient Vitals for the past 24 hrs:  BP Temp Temp src Pulse Resp SpO2 Height Weight  07/10/17 1527 - - - - - - 5\' 5"  (1.651 m) 117 kg (258 lb)  07/10/17 1525 (!) 143/84 97.9 F (36.6 C) Oral 85 18 99 % - -    7:36 PM Reevaluation with update and discussion. After initial assessment and treatment, an updated evaluation reveals no change in clinical status.  Findings discussed with the patient and all questions answered. Mancel BaleElliott Wynston Romey     Final Clinical Impressions(s) / ED Diagnoses   Final diagnoses:  Contusion, multiple sites   Apparent mechanical fall without serious injury.  Suspect multiple contusions without fracture, spine injury or significant neuropathy.   Nursing Notes Reviewed/ Care Coordinated Applicable Imaging Reviewed Interpretation of Laboratory Data incorporated into ED treatment  The patient appears reasonably screened and/or stabilized for discharge and I doubt any other medical condition or other [facility] requiring further screening, evaluation, or treatment in the ED at this time prior to discharge.  Plan: Home Medications-APAP for pain, continue usual medications; Home Treatments-rest, gradually advance activity; return here if the recommended treatment, does not improve the symptoms; Recommended follow up-PCP, as needed   ED Discharge Orders    None       Mancel BaleWentz, Adar Rase, MD 07/10/17 [phone]

## 2017-07-10 NOTE — Discharge Instructions (Signed)
There is no sign that you have a significant spine injury, nerve injury, fracture or muscle problem.  Your symptoms should improve with rest, Tylenol, and gentle activity.  Follow-up with your primary care provider, if you are not better in 1 week.

## 2017-07-11 ENCOUNTER — Ambulatory Visit
Admit: 2017-07-11 | Discharge: 2017-07-11 | Payer: PRIVATE HEALTH INSURANCE | Attending: Nurse Practitioner | Primary: Internal Medicine

## 2017-07-11 DIAGNOSIS — Z09 Encounter for follow-up examination after completed treatment for conditions other than malignant neoplasm: Secondary | ICD-10-CM

## 2017-07-11 LAB — AMB POC LIPID PROFILE
Cholesterol (POC): 200
HDL Cholesterol (POC): 61
LDL Cholesterol (POC): 119 MG/DL
TChol/HDL Ratio (POC): 3.3
Triglycerides (POC): 100
VLDL (POC): 139 MG/DL

## 2017-07-11 LAB — AMB POC HEMOGLOBIN A1C: Hemoglobin A1c (POC): 5.2 %

## 2017-07-11 MED ORDER — LISINOPRIL 10 MG TAB
10 mg | ORAL_TABLET | Freq: Every day | ORAL | 6 refills | Status: DC
Start: 2017-07-11 — End: 2017-09-10

## 2017-07-11 NOTE — Progress Notes (Signed)
Chief Complaint   Patient presents with   ??? Cholesterol Problem     1. Have you been to the ER, urgent care clinic since your last visit?  Hospitalized since your last visit?No    2. Have you seen or consulted any other health care providers outside of the [facility] since your last visit?  Include any pap smears or colon screening. No
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
## 2017-07-11 NOTE — Patient Instructions (Addendum)
High Blood Pressure: Care Instructions  Your Care Instructions    If your blood pressure is usually above 130/80, you have high blood pressure, or hypertension. That means the top number is 130 or higher or the bottom number is 80 or higher, or both.  Despite what a lot of people think, high blood pressure usually doesn't cause headaches or make you feel dizzy or lightheaded. It usually has no symptoms. But it does increase your risk for heart attack, stroke, and kidney or eye damage. The higher your blood pressure, the more your risk increases.  Your doctor will give you a goal for your blood pressure. Your goal will be based on your health and your age.  Lifestyle changes, such as eating healthy and being active, are always important to help lower blood pressure. You might also take medicine to reach your blood pressure goal.  Follow-up care is a key part of your treatment and safety. Be sure to make and go to all appointments, and call your doctor if you are having problems. It's also a good idea to know your test results and keep a list of the medicines you take.  How can you care for yourself at home?  Medical treatment  ?? If you stop taking your medicine, your blood pressure will go back up. You may take one or more types of medicine to lower your blood pressure. Be safe with medicines. Take your medicine exactly as prescribed. Call your doctor if you think you are having a problem with your medicine.  ?? Talk to your doctor before you start taking aspirin every day. Aspirin can help certain people lower their risk of a heart attack or stroke. But taking aspirin isn't right for everyone, because it can cause serious bleeding.  ?? See your doctor regularly. You may need to see the doctor more often at first or until your blood pressure comes down.  ?? If you are taking blood pressure medicine, talk to your doctor before you take decongestants or anti-inflammatory medicine, such as ibuprofen.  Some of these medicines can raise blood pressure.  ?? Learn how to check your blood pressure at home.  Lifestyle changes  ?? Stay at a healthy weight. This is especially important if you put on weight around the waist. Losing even 10 pounds can help you lower your blood pressure.  ?? If your doctor recommends it, get more exercise. Walking is a good choice. Bit by bit, increase the amount you walk every day. Try for at least 30 minutes on most days of the week. You also may want to swim, bike, or do other activities.  ?? Avoid or limit alcohol. Talk to your doctor about whether you can drink any alcohol.  ?? Try to limit how much sodium you eat to less than 2,300 milligrams (mg) a day. Your doctor may ask you to try to eat less than 1,500 mg a day.  ?? Eat plenty of fruits (such as bananas and oranges), vegetables, legumes, whole grains, and low-fat dairy products.  ?? Lower the amount of saturated fat in your diet. Saturated fat is found in animal products such as milk, cheese, and meat. Limiting these foods may help you lose weight and also lower your risk for heart disease.  ?? Do not smoke. Smoking increases your risk for heart attack and stroke. If you need help quitting, talk to your doctor about stop-smoking programs and medicines. These can increase your chances of quitting for good.  When   should you call for help?  Call 911 anytime you think you may need emergency care. This may mean having symptoms that suggest that your blood pressure is causing a serious heart or blood vessel problem. Your blood pressure may be over 180/120.  ??For example, call 911 if:  ?? ?? You have symptoms of a heart attack. These may include:  ? Chest pain or pressure, or a strange feeling in the chest.  ? Sweating.  ? Shortness of breath.  ? Nausea or vomiting.  ? Pain, pressure, or a strange feeling in the back, neck, jaw, or upper belly or in one or both shoulders or arms.  ? Lightheadedness or sudden weakness.   ? A fast or irregular heartbeat.   ?? ?? You have symptoms of a stroke. These may include:  ? Sudden numbness, tingling, weakness, or loss of movement in your face, arm, or leg, especially on only one side of your body.  ? Sudden vision changes.  ? Sudden trouble speaking.  ? Sudden confusion or trouble understanding simple statements.  ? Sudden problems with walking or balance.  ? A sudden, severe headache that is different from past headaches.   ?? ?? You have severe back or belly pain.   ??Do not wait until your blood pressure comes down on its own. Get help right away.  ??Call your doctor now or seek immediate care if:  ?? ?? Your blood pressure is much higher than normal (such as 180/120 or higher), but you don't have symptoms.   ?? ?? You think high blood pressure is causing symptoms, such as:  ? Severe headache.  ? Blurry vision.   ??Watch closely for changes in your health, and be sure to contact your doctor if:  ?? ?? Your blood pressure measures higher than your doctor recommends at least 2 times. That means the top number is higher or the bottom number is higher, or both.   ?? ?? You think you may be having side effects from your blood pressure medicine.   Where can you learn more?  Go to [employer]Stats.cahttp://www.healthwise.net/GoodHelpConnections.  Enter 940-235-3279X567 in the search box to learn more about "High Blood Pressure: Care Instructions."  Current as of: July 19, 2016  Content Version: 11.8  ?? 2006-2018 [employer]. Care instructions adapted under license by [employer] (which disclaims liability or warranty for this information). If you have questions about a medical condition or this instruction, always ask your healthcare professional. [employer] disclaims any warranty or liability for your use of this information.         Seizure: Care Instructions  Your Care Instructions    Seizures are caused by abnormal patterns of electrical signals in the brain. They are different for each person.   Seizures can affect movement, speech, vision, or awareness. Some people have only slight shaking of a hand and do not pass out. Other people may pass out and have violent shaking of the whole body. Some people appear to stare into space. They are awake, but they can't respond normally. Later, they may not remember what happened.  You may need tests to identify the type and cause of the seizures.  A seizure may occur only once, or you may have them more than one time. Taking medicines as directed and following up with your doctor may help keep you from having more seizures.  The doctor has checked you carefully, but problems can develop later. If you notice any problems or new  symptoms, get medical treatment right away.  Follow-up care is a key part of your treatment and safety. Be sure to make and go to all appointments, and call your doctor if you are having problems. It's also a good idea to know your test results and keep a list of the medicines you take.  How can you care for yourself at home?  ?? Be safe with medicines. Take your medicines exactly as prescribed. Call your doctor if you think you are having a problem with your medicine.  ?? Do not do any activity that could be dangerous to you or others until your doctor says it is safe to do so. For example, do not drive a car, operate machinery, swim, or climb ladders.  ?? Be sure that anyone treating you for any health problem knows that you have had a seizure and what medicines you are taking for it.  ?? Identify and avoid things that may make you more likely to have a seizure. These may include lack of sleep, alcohol or drug use, stress, or not eating.  ?? Make sure you go to your follow-up appointment.  When should you call for help?  Call 911 anytime you think you may need emergency care. For example, call if:  ?? ?? You have another seizure.   ?? ?? You have more than one seizure in 24 hours.    ?? ?? You have new symptoms, such as trouble walking, speaking, or thinking clearly.   ??Call your doctor now or seek immediate medical care if:  ?? ?? You are not acting normally.   ??Watch closely for changes in your health, and be sure to contact your doctor if you have any problems.  Where can you learn more?  Go to InsuranceStats.cahttp://www.healthwise.net/GoodHelpConnections.  Enter 606-496-3948M769 in the search box to learn more about "Seizure: Care Instructions."  Current as of: January 15, 2017  Content Version: 11.8  ?? 2006-2018 [employer]. Care instructions adapted under license by [employer] (which disclaims liability or warranty for this information). If you have questions about a medical condition or this instruction, always ask your healthcare professional. [employer] disclaims any warranty or liability for your use of this information.

## 2017-07-14 ENCOUNTER — Emergency Department: Admit: 2017-07-14 | Payer: MEDICARE | Primary: Internal Medicine

## 2017-07-14 ENCOUNTER — Inpatient Hospital Stay
Admit: 2017-07-14 | Discharge: 2017-07-22 | Disposition: A | Discharge status: Skilled Nursing Facility | Payer: MEDICARE | Attending: Internal Medicine | Admitting: Internal Medicine

## 2017-07-14 DIAGNOSIS — G40501 Epileptic seizures related to external causes, not intractable, with status epilepticus: Principal | ICD-10-CM

## 2017-07-14 LAB — CBC WITH AUTOMATED DIFF
ABS. BASOPHILS: 0 10*3/uL (ref 0.0–0.1)
ABS. EOSINOPHILS: 0 10*3/uL (ref 0.0–0.4)
ABS. IMM. GRANS.: 0 10*3/uL (ref 0.00–0.04)
ABS. LYMPHOCYTES: 0.8 10*3/uL (ref 0.8–3.5)
ABS. MONOCYTES: 0.5 10*3/uL (ref 0.0–1.0)
ABS. NEUTROPHILS: 7.4 10*3/uL (ref 1.8–8.0)
ABSOLUTE NRBC: 0 10*3/uL (ref 0.00–0.01)
BASOPHILS: 0 % (ref 0–1)
EOSINOPHILS: 0 % (ref 0–7)
HCT: 37 % (ref 35.0–47.0)
HGB: 12 g/dL (ref 11.5–16.0)
IMMATURE GRANULOCYTES: 0 % (ref 0.0–0.5)
LYMPHOCYTES: 9 % — ABNORMAL LOW (ref 12–49)
MCH: 31.2 PG (ref 26.0–34.0)
MCHC: 32.4 g/dL (ref 30.0–36.5)
MCV: 96.1 FL (ref 80.0–99.0)
MONOCYTES: 6 % (ref 5–13)
MPV: 10.5 FL (ref 8.9–12.9)
NEUTROPHILS: 85 % — ABNORMAL HIGH (ref 32–75)
NRBC: 0 PER 100 WBC
PLATELET: 386 10*3/uL (ref 150–400)
RBC: 3.85 M/uL (ref 3.80–5.20)
RDW: 14.6 % — ABNORMAL HIGH (ref 11.5–14.5)
WBC: 8.7 10*3/uL (ref 3.6–11.0)

## 2017-07-14 LAB — METABOLIC PANEL, COMPREHENSIVE
A-G Ratio: 0.9 — ABNORMAL LOW (ref 1.1–2.2)
ALT (SGPT): 27 U/L (ref 12–78)
AST (SGOT): 32 U/L (ref 15–37)
Albumin: 3.3 g/dL — ABNORMAL LOW (ref 3.5–5.0)
Alk. phosphatase: 47 U/L (ref 45–117)
Anion gap: 11 mmol/L (ref 5–15)
BUN/Creatinine ratio: 10 — ABNORMAL LOW (ref 12–20)
BUN: 10 MG/DL (ref 6–20)
Bilirubin, total: 0.3 MG/DL (ref 0.2–1.0)
CO2: 23 mmol/L (ref 21–32)
Calcium: 9 MG/DL (ref 8.5–10.1)
Chloride: 103 mmol/L (ref 97–108)
Creatinine: 1.04 MG/DL — ABNORMAL HIGH (ref 0.55–1.02)
GFR est AA: >60 mL/min/{1.73_m2} (ref >60)
GFR est non-AA: 53 mL/min/{1.73_m2} — ABNORMAL LOW (ref >60)
Globulin: 3.6 g/dL (ref 2.0–4.0)
Glucose: 185 mg/dL — ABNORMAL HIGH (ref 65–100)
Potassium: 3.9 mmol/L (ref 3.5–5.1)
Protein, total: 6.9 g/dL (ref 6.4–8.2)
Sodium: 137 mmol/L (ref 136–145)

## 2017-07-14 LAB — PROTHROMBIN TIME + INR
INR: 1.2 — ABNORMAL HIGH (ref 0.9–1.1)
Prothrombin time: 12.4 s — ABNORMAL HIGH (ref 9.0–11.1)

## 2017-07-14 LAB — DRUG SCREEN, URINE
AMPHETAMINES: NEGATIVE
BARBITURATES: NEGATIVE
BENZODIAZEPINES: POSITIVE — AB
COCAINE: NEGATIVE
METHADONE: NEGATIVE
OPIATES: NEGATIVE
PCP(PHENCYCLIDINE): NEGATIVE
THC (TH-CANNABINOL): NEGATIVE

## 2017-07-14 LAB — GLUCOSE, POC
Glucose (POC): 183 mg/dL — ABNORMAL HIGH (ref 65–100)
Glucose (POC): 106 mg/dL — ABNORMAL HIGH (ref 65–100)
Glucose (POC): 81 mg/dL (ref 65–100)

## 2017-07-14 LAB — URINALYSIS W/ REFLEX CULTURE
Bacteria: NEGATIVE /hpf
Bilirubin: NEGATIVE
Blood: NEGATIVE
Glucose: 100 mg/dL — AB
Leukocyte Esterase: NEGATIVE
Nitrites: NEGATIVE
Specific gravity: 1.014 (ref 1.003–1.030)
Urobilinogen: 0.2 EU/dL (ref 0.2–1.0)
pH (UA): 5.5 (ref 5.0–8.0)

## 2017-07-14 LAB — BLOOD GAS, ARTERIAL
BASE DEFICIT: 4.7 mmol/L
BICARBONATE: 22 mmol/L (ref 22–26)
O2 SAT: 96 % (ref 92–97)
PCO2: 45 mmHg (ref 35.0–45.0)
PO2: 85 mmHg (ref 80–100)
pH: 7.3 — ABNORMAL LOW (ref 7.35–7.45)

## 2017-07-14 LAB — ACETAMINOPHEN: Acetaminophen level: <2 ug/mL — ABNORMAL LOW (ref 10–30)

## 2017-07-14 LAB — AMMONIA: Ammonia, plasma: 33 umol/L — ABNORMAL HIGH (ref <32)

## 2017-07-14 LAB — VALPROIC ACID: Valproic acid: 63 ug/ml (ref 50–100)

## 2017-07-14 LAB — MAGNESIUM: Magnesium: 1.7 mg/dL (ref 1.6–2.4)

## 2017-07-14 LAB — ETHYL ALCOHOL: ALCOHOL(ETHYL),SERUM: <10 MG/DL (ref <10)

## 2017-07-14 MED ORDER — DEXTROSE 5%-1/2 NORMAL SALINE IV
INTRAVENOUS | Status: DC
Start: 2017-07-14 — End: 2017-07-19
  Administered 2017-07-14 – 2017-07-19 (×6): via INTRAVENOUS

## 2017-07-14 MED ORDER — ACETAMINOPHEN 650 MG RECTAL SUPPOSITORY
650 mg | Freq: Four times a day (QID) | RECTAL | Status: DC | PRN
Start: 2017-07-14 — End: 2017-07-21
  Administered 2017-07-15: via RECTAL

## 2017-07-14 MED ORDER — INSULIN LISPRO 100 UNIT/ML INJECTION
100 unit/mL | Freq: Four times a day (QID) | SUBCUTANEOUS | Status: DC
Start: 2017-07-14 — End: 2017-07-17
  Administered 2017-07-15 – 2017-07-17 (×2): via SUBCUTANEOUS

## 2017-07-14 MED ORDER — BISACODYL 10 MG RECTAL SUPPOSITORY
10 mg | Freq: Every day | RECTAL | Status: DC | PRN
Start: 2017-07-14 — End: 2017-07-21

## 2017-07-14 MED ORDER — DEXTROSE 50% IN WATER (D50W) IV SYRG
INTRAVENOUS | Status: DC | PRN
Start: 2017-07-14 — End: 2017-07-21

## 2017-07-14 MED ORDER — GLUCAGON 1 MG INJECTION
1 mg | INTRAMUSCULAR | Status: DC | PRN
Start: 2017-07-14 — End: 2017-07-21

## 2017-07-14 MED ORDER — ONDANSETRON (PF) 4 MG/2 ML INJECTION
4 mg/2 mL | Freq: Four times a day (QID) | INTRAMUSCULAR | Status: DC | PRN
Start: 2017-07-14 — End: 2017-07-21

## 2017-07-14 MED ORDER — LORAZEPAM 2 MG/ML IJ SOLN
2 mg/mL | INTRAMUSCULAR | Status: DC | PRN
Start: 2017-07-14 — End: 2017-07-14

## 2017-07-14 MED ORDER — SODIUM CHLORIDE 0.9 % IV
20020 mg/20 mL | Freq: Two times a day (BID) | INTRAVENOUS | Status: DC
Start: 2017-07-14 — End: 2017-07-19
  Administered 2017-07-14 – 2017-07-19 (×10): via INTRAVENOUS

## 2017-07-14 MED ORDER — GLUCOSE 4 GRAM CHEWABLE TAB
4 gram | ORAL | Status: DC | PRN
Start: 2017-07-14 — End: 2017-07-21

## 2017-07-14 MED ORDER — SODIUM CHLORIDE 0.9 % IJ SYRG
INTRAMUSCULAR | Status: DC | PRN
Start: 2017-07-14 — End: 2017-07-21
  Administered 2017-07-16 – 2017-07-19 (×3): via INTRAVENOUS

## 2017-07-14 MED ORDER — LACOSAMIDE 200 MG/20 ML IV SOLN
200 mg/20 mL | Freq: Two times a day (BID) | INTRAVENOUS | Status: DC
Start: 2017-07-14 — End: 2017-07-14
  Administered 2017-07-14: 17:00:00 via INTRAVENOUS

## 2017-07-14 MED ORDER — NALOXONE 1 MG/ML IJ SYRG(AKA NARCAN)
1 mg/mL | INTRAMUSCULAR | Status: AC
Start: 2017-07-14 — End: 2017-07-14

## 2017-07-14 MED ORDER — LORAZEPAM 2 MG/ML IJ SOLN
2 mg/mL | INTRAMUSCULAR | Status: DC | PRN
Start: 2017-07-14 — End: 2017-07-21

## 2017-07-14 MED ORDER — SODIUM CHLORIDE 0.9 % IV
INTRAVENOUS | Status: DC
Start: 2017-07-14 — End: 2017-07-14
  Administered 2017-07-14: 18:00:00 via INTRAVENOUS

## 2017-07-14 MED ORDER — SODIUM CHLORIDE 0.9 % IJ SYRG
Freq: Three times a day (TID) | INTRAMUSCULAR | Status: DC
Start: 2017-07-14 — End: 2017-07-21
  Administered 2017-07-14 – 2017-07-21 (×22): via INTRAVENOUS

## 2017-07-14 MED ORDER — VALPROATE SODIUM 100 MG/ML IV
500 mg/5 mL (100 mg/mL) | INTRAVENOUS | Status: AC
Start: 2017-07-14 — End: 2017-07-14
  Administered 2017-07-14: 13:00:00 via INTRAVENOUS

## 2017-07-14 MED ORDER — ENOXAPARIN 40 MG/0.4 ML SUB-Q SYRINGE
40 mg/0.4 mL | SUBCUTANEOUS | Status: DC
Start: 2017-07-14 — End: 2017-07-21
  Administered 2017-07-14 – 2017-07-21 (×6): via SUBCUTANEOUS

## 2017-07-14 MED ORDER — LABETALOL 5 MG/ML IV SYRINGE
20 mg/4 mL (5 mg/mL) | INTRAVENOUS | Status: DC | PRN
Start: 2017-07-14 — End: 2017-07-21
  Administered 2017-07-15: 06:00:00 via INTRAVENOUS

## 2017-07-14 MED ORDER — SODIUM CHLORIDE 0.9 % IV
5005100 mg/5 mL (100 mg/mL) | Freq: Two times a day (BID) | INTRAVENOUS | Status: DC
Start: 2017-07-14 — End: 2017-07-19
  Administered 2017-07-15 – 2017-07-19 (×9): via INTRAVENOUS

## 2017-07-14 MED ORDER — LORAZEPAM 2 MG/ML IJ SOLN
2 mg/mL | Freq: Once | INTRAMUSCULAR | Status: AC
Start: 2017-07-14 — End: 2017-07-14
  Administered 2017-07-14: 11:00:00 via INTRAVENOUS

## 2017-07-14 MED ORDER — ASPIRIN 300 MG RECTAL SUPPOSITORY
300 mg | Freq: Every day | RECTAL | Status: DC
Start: 2017-07-14 — End: 2017-07-17
  Administered 2017-07-14 – 2017-07-17 (×3): via RECTAL

## 2017-07-14 MED FILL — DEXTROSE 5%-1/2 NORMAL SALINE IV: INTRAVENOUS | Qty: 1000

## 2017-07-14 MED FILL — SODIUM CHLORIDE 0.9 % IV: INTRAVENOUS | Qty: 1000

## 2017-07-14 MED FILL — VALPROATE SODIUM 100 MG/ML IV: 500 mg/5 mL (100 mg/mL) | INTRAVENOUS | Qty: 5

## 2017-07-14 MED FILL — ASPIRIN 300 MG RECTAL SUPPOSITORY: 300 mg | RECTAL | Qty: 1

## 2017-07-14 MED FILL — VIMPAT 200 MG/20 ML INTRAVENOUS SOLUTION: 200 mg/20 mL | INTRAVENOUS | Qty: 10

## 2017-07-14 MED FILL — ACEPHEN 650 MG RECTAL SUPPOSITORY: 650 mg | RECTAL | Qty: 1

## 2017-07-14 MED FILL — LORAZEPAM 2 MG/ML IJ SOLN: 2 mg/mL | INTRAMUSCULAR | Qty: 1

## 2017-07-14 MED FILL — NALOXONE 1 MG/ML IJ SYRG(AKA NARCAN): 1 mg/mL | INTRAMUSCULAR | Qty: 2

## 2017-07-14 MED FILL — ENOXAPARIN 40 MG/0.4 ML SUB-Q SYRINGE: 40 mg/0.4 mL | SUBCUTANEOUS | Qty: 0.4

## 2017-07-14 NOTE — ED Triage Notes (Addendum)
Pt arrived via EMS from home. Per medics, pt found in bed having a seizure. Hx of seizures. Medics witnessed patient having 3 seizures ( tonic clonic) while in home with snoring respirations. 5mg  Versed given intranasal by EMT. Pt arrived to   ER unresponsive to verbal and painful stimuli. Narcan given per orders. Pt on NRB with O2 level 100%.

## 2017-07-14 NOTE — ED Notes (Signed)
 1mg  Narcan given with no change.

## 2017-07-14 NOTE — ED Notes (Signed)
[facility]

## 2017-07-14 NOTE — ED Notes (Signed)
Patient back from CT at this time

## 2017-07-14 NOTE — ED Notes (Signed)
Patient to CT

## 2017-07-14 NOTE — ED Notes (Signed)
MD removed NRB. SpO2 98% on RA. Pt responsive to needlestick only at this time.

## 2017-07-14 NOTE — ED Notes (Addendum)
Primary Nurse Margaretmary BayleyHeidi J Nemiah Kissner, RN and Alben SpittleSamara T., RN performed a dual skin assessment on this patient No impairment noted  Braden score is 13
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
## 2017-07-14 NOTE — ED Notes (Signed)
Patients daughter [name]  [phone] states that she found patient on the floor snoring this morning. Patients daughter states that she lives with her. Patient assisted to position of comfort.

## 2017-07-14 NOTE — Progress Notes (Addendum)
Patient arrived to unit responsive only to pain. She's snoring and drooling. BP elevated at 169/84; HR 102, Spo2 94% on RA; resp rate 18; temp 99.8.     MEW score was 4. RRT RN Rosalia HammersRay was notified. He assessed patient. No new orders.     Patient's sisters and cousin at the bedside. Her sister  Neta MendsCaroline Cosby stated that the Dr. Javier DockerWanted to speak with her regarding patient's hx of seizures. I spoke with Dr. Ephriam KnucklesNyguyen and was advised to contact the neurologist.

## 2017-07-14 NOTE — Progress Notes (Addendum)
Patient is a diabetic, but is NPO. Her blood glucose was 81. I spoke with Dr. Smitty CordsNagamangala and order was given for replace 0.9% NS with D5 1/2 NS at 125 ml/hr continuous.       Nasal airway remains in right nare. I spoke with Respiratory therapist and was advised to keep airway in.     Her temp is 100.4 Tylenol suppository was administered.     Patient is more alert and opening her eyes and moving her arms. .     Patient's daughter is [name] and her number is [phone].    Her sister's number is [phone]
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
## 2017-07-14 NOTE — Progress Notes (Signed)
Pt tachy in 130-140's. Found sitting in bed with eyes closed. Pt repositioned, resting quietly after repositioning. HR in low 100s. Bed alarm on.

## 2017-07-14 NOTE — Progress Notes (Signed)
Neurology consult called in. Awaiting call back from Dr. Alcario DroughtHegab.
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
## 2017-07-15 LAB — METABOLIC PANEL, COMPREHENSIVE
A-G Ratio: 0.8 — ABNORMAL LOW (ref 1.1–2.2)
ALT (SGPT): 24 U/L (ref 12–78)
AST (SGOT): 24 U/L (ref 15–37)
Albumin: 3.3 g/dL — ABNORMAL LOW (ref 3.5–5.0)
Alk. phosphatase: 45 U/L (ref 45–117)
Anion gap: 6 mmol/L (ref 5–15)
BUN/Creatinine ratio: 7 — ABNORMAL LOW (ref 12–20)
BUN: 6 MG/DL (ref 6–20)
Bilirubin, total: 0.5 MG/DL (ref 0.2–1.0)
CO2: 27 mmol/L (ref 21–32)
Calcium: 8.9 MG/DL (ref 8.5–10.1)
Chloride: 101 mmol/L (ref 97–108)
Creatinine: 0.91 MG/DL (ref 0.55–1.02)
GFR est AA: >60 mL/min/{1.73_m2} (ref >60)
GFR est non-AA: >60 mL/min/{1.73_m2} (ref >60)
Globulin: 3.9 g/dL (ref 2.0–4.0)
Glucose: 133 mg/dL — ABNORMAL HIGH (ref 65–100)
Potassium: 3.8 mmol/L (ref 3.5–5.1)
Protein, total: 7.2 g/dL (ref 6.4–8.2)
Sodium: 134 mmol/L — ABNORMAL LOW (ref 136–145)

## 2017-07-15 LAB — CBC WITH AUTOMATED DIFF
ABS. BASOPHILS: 0 10*3/uL (ref 0.0–0.1)
ABS. EOSINOPHILS: 0 10*3/uL (ref 0.0–0.4)
ABS. IMM. GRANS.: 0 10*3/uL (ref 0.00–0.04)
ABS. LYMPHOCYTES: 1.8 10*3/uL (ref 0.8–3.5)
ABS. MONOCYTES: 1.4 10*3/uL — ABNORMAL HIGH (ref 0.0–1.0)
ABS. NEUTROPHILS: 5.4 10*3/uL (ref 1.8–8.0)
ABSOLUTE NRBC: 0 10*3/uL (ref 0.00–0.01)
BASOPHILS: 0 % (ref 0–1)
EOSINOPHILS: 0 % (ref 0–7)
HCT: 40.9 % (ref 35.0–47.0)
HGB: 13.8 g/dL (ref 11.5–16.0)
IMMATURE GRANULOCYTES: 0 % (ref 0.0–0.5)
LYMPHOCYTES: 21 % (ref 12–49)
MCH: 31.4 PG (ref 26.0–34.0)
MCHC: 33.7 g/dL (ref 30.0–36.5)
MCV: 93.2 FL (ref 80.0–99.0)
MONOCYTES: 17 % — ABNORMAL HIGH (ref 5–13)
MPV: 10.8 FL (ref 8.9–12.9)
NEUTROPHILS: 62 % (ref 32–75)
NRBC: 0 PER 100 WBC
PLATELET: 357 10*3/uL (ref 150–400)
RBC: 4.39 M/uL (ref 3.80–5.20)
RDW: 14.4 % (ref 11.5–14.5)
WBC: 8.7 10*3/uL (ref 3.6–11.0)

## 2017-07-15 LAB — EKG, 12 LEAD, INITIAL
Atrial Rate: 108 {beats}/min
Calculated P Axis: 62 degrees
Calculated R Axis: 44 degrees
Calculated T Axis: 46 degrees
P-R Interval: 152 ms
Q-T Interval: 348 ms
QRS Duration: 72 ms
QTC Calculation (Bezet): 466 ms
Ventricular Rate: 108 {beats}/min

## 2017-07-15 LAB — CULTURE, URINE
Colonies Counted: <1000
Colony Count: <1000
Culture result:: NO GROWTH
Culture: NO GROWTH

## 2017-07-15 LAB — GLUCOSE, POC
Glucose (POC): 131 mg/dL — ABNORMAL HIGH (ref 65–100)
Glucose (POC): 143 mg/dL — ABNORMAL HIGH (ref 65–100)
Glucose (POC): 142 mg/dL — ABNORMAL HIGH (ref 65–100)
Glucose (POC): 163 mg/dL — ABNORMAL HIGH (ref 65–100)

## 2017-07-15 LAB — AMMONIA: Ammonia, plasma: 39 umol/L — ABNORMAL HIGH (ref <32)

## 2017-07-15 LAB — EKG 12-LEAD
Atrial Rate: 108 {beats}/min
P Axis: 62 degrees
P-R Interval: 152 ms
Q-T Interval: 348 ms
QRS Duration: 72 ms
QTc Calculation (Bazett): 466 ms
R Axis: 44 degrees
T Axis: 46 degrees
Ventricular Rate: 108 {beats}/min

## 2017-07-15 MED ORDER — METOPROLOL TARTRATE 5 MG/5 ML IV SOLN
5 mg/ mL | Freq: Two times a day (BID) | INTRAVENOUS | Status: DC
Start: 2017-07-15 — End: 2017-07-17
  Administered 2017-07-15 – 2017-07-17 (×4): via INTRAVENOUS

## 2017-07-15 MED FILL — VALPROATE SODIUM 100 MG/ML IV: 500 mg/5 mL (100 mg/mL) | INTRAVENOUS | Qty: 5

## 2017-07-15 MED FILL — BD POSIFLUSH NORMAL SALINE 0.9 % INJECTION SYRINGE: INTRAMUSCULAR | Qty: 10

## 2017-07-15 MED FILL — ENOXAPARIN 40 MG/0.4 ML SUB-Q SYRINGE: 40 mg/0.4 mL | SUBCUTANEOUS | Qty: 0.4

## 2017-07-15 MED FILL — VIMPAT 200 MG/20 ML INTRAVENOUS SOLUTION: 200 mg/20 mL | INTRAVENOUS | Qty: 10

## 2017-07-15 MED FILL — METOPROLOL TARTRATE 5 MG/5 ML IV SOLN: 5 mg/ mL | INTRAVENOUS | Qty: 5

## 2017-07-15 MED FILL — ASPIRIN 300 MG RECTAL SUPPOSITORY: 300 mg | RECTAL | Qty: 1

## 2017-07-15 MED FILL — LABETALOL 5 MG/ML IV SYRINGE: 20 mg/4 mL (5 mg/mL) | INTRAVENOUS | Qty: 4

## 2017-07-15 NOTE — Other (Signed)
Bedside and Verbal shift change report given to Lisa, RN (oncoming nurse) by Choudou, RN (offgoing nurse). Report included the following information SBAR, Kardex, Intake/Output and MAR.
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
## 2017-07-15 NOTE — Progress Notes (Signed)
Hospitalist Progress NoteNAME: Christiana FuchsBarbara J Dunker   DOB:  1945/10/04   MRN:  [id number]     Interim Hospital Summary: 71 y.o. female whom presented on 07/14/2017 with      Assessment / Plan:    1. Seizure disorder : with witnessed sz yday. Seen by Neurology. CT head negative. On vimpat and Depakote. Not on Keppra as listed as allergy.? MRI. EEG with diffuse slow waves.  Continue seizure and aspiration precaution  2.   Acute encephalopathy (POA): with being unresponsive except to pain, due to presumed prolonged post-ictal state. Awake today but does not respond appropriately.  UDS + benzo but was given versed by EMS. Ucx pending.  Ammonia mildly elevated at 39 today. LFTs wnl. NPO for now. On IVF.     3. DM2: HgA1c 5.2. On SSI, continue accuchecks.    4. HTN: elevated. Holding home lisinopril. Will add scheduled IV metoprolol while NPO. On PRN labetalol.     5. Hx psychiatric illness and TBI??   .  ??  Code: presumed full   DVT prophylaxis: lovenox  Surrogate decision maker:  Emergency contact in chart listing daughter [name] and sister [name]      Subjective:     Chief Complaint / Reason for Physician Visit  Responds to verbal commands but not appropriately.    Discussed with RN events overnight.     Review of Systems: unable to obtain due to menta status  Symptom Y/N Comments  Symptom Y/N Comments   Fever/Chills    Chest Pain     Poor Appetite    Edema     Cough    Abdominal Pain     Sputum    Joint Pain     SOB/DOE    Pruritis/Rash     Nausea/vomit    Tolerating PT/OT     Diarrhea    Tolerating Diet     Constipation    Other         Objective:     VITALS:   Last 24hrs VS reviewed since prior progress note. Most recent are:  Patient Vitals for the past 24 hrs:   Temp Pulse Resp BP SpO2   07/15/17 1112 98.7 ??F (37.1 ??C) (!) 101 18 153/90 100 %   07/15/17 0711 98.1 ??F (36.7 ??C) 97 18 173/82 100 %   07/15/17 0518 98.9 ??F (37.2 ??C) 94 18 165/90 98 %    07/15/17 0209 ??? 88 ??? 173/88 ???   07/15/17 0019 98.5 ??F (36.9 ??C) (!) 113 18 (!) 174/97 100 %   07/14/17 2006 99.1 ??F (37.3 ??C) (!) 104 18 168/89 99 %   07/14/17 1449 100.4 ??F (38 ??C) (!) 101 18 150/78 100 %   07/14/17 1210 99.8 ??F (37.7 ??C) (!) 102 18 169/84 94 %       Intake/Output Summary (Last 24 hours) at 07/15/2017 1137  Last data filed at 07/15/2017 0709  Gross per 24 hour   Intake ???   Output 2400 ml   Net -2400 ml        PHYSICAL EXAM:  General: WD, WN. Wakes up but unable to answer questions??  EENT:  EOMI. Anicteric sclerae. MMM  Resp:  CTA bilaterally, no wheezing or rales.  No accessory muscle use  CV:  Regular  rhythm,?? No edema  GI:  Soft, Non distended, Non tender. ??+Bowel sounds  Neurologic:??  Wakes up but unable to answer questions??    Psych:????  Not anxious nor agitated  Skin:  no rashes or ulcers.  No jaundice    Reviewed most current lab test results and cultures  YES  Reviewed most current radiology test results   YES  Review and summation of old records today    NO  Reviewed patient's current orders and MAR    YES  PMH/SH reviewed - no change compared to H&P  ________________________________________________________________________  Care Plan discussed with:    Comments   Patient     Family      RN x    Care Manager                    Consultant:      ________________________________________________________________________  Total NON critical care TIME: 25  Minutes    Total CRITICAL CARE TIME Spent:   Minutes non procedure based      Comments   >50% of visit spent in counseling and coordination of care     ________________________________________________________________________  Daune PerchVidya Lynford Espinoza, MD     Procedures: see electronic medical records for all procedures/Xrays and details which were not copied into this note but were reviewed prior to creation of Plan.      LABS:  I reviewed today's most current labs and imaging studies.  Pertinent labs include:  Recent Labs     07/15/17  0354 07/14/17   0817   WBC 8.7 8.7   HGB 13.8 12.0   HCT 40.9 37.0   PLT 357 386     Recent Labs     07/15/17  0354 07/14/17  0858 07/14/17  0817 07/14/17  0719   NA 134*  --   --  137   K 3.8  --   --  3.9   CL 101  --   --  103   CO2 27  --   --  23   GLU 133*  --   --  185*   BUN 6  --   --  10   CREA 0.91  --   --  1.04*   CA 8.9  --   --  9.0   MG  --  1.7  --   --    ALB 3.3*  --   --  3.3*   TBILI 0.5  --   --  0.3   SGOT 24  --   --  32   ALT 24  --   --  27   INR  --   --  1.2*  --
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
## 2017-07-16 LAB — GLUCOSE, POC
Glucose (POC): 104 mg/dL — ABNORMAL HIGH (ref 65–100)
Glucose (POC): 112 mg/dL — ABNORMAL HIGH (ref 65–100)
Glucose (POC): 127 mg/dL — ABNORMAL HIGH (ref 65–100)

## 2017-07-16 MED ORDER — HYDRALAZINE 20 MG/ML IJ SOLN
20 mg/mL | Freq: Four times a day (QID) | INTRAMUSCULAR | Status: DC | PRN
Start: 2017-07-16 — End: 2017-07-21

## 2017-07-16 MED FILL — VIMPAT 200 MG/20 ML INTRAVENOUS SOLUTION: 200 mg/20 mL | INTRAVENOUS | Qty: 20

## 2017-07-16 MED FILL — ASPIRIN 300 MG RECTAL SUPPOSITORY: 300 mg | RECTAL | Qty: 1

## 2017-07-16 MED FILL — ENOXAPARIN 40 MG/0.4 ML SUB-Q SYRINGE: 40 mg/0.4 mL | SUBCUTANEOUS | Qty: 0.4

## 2017-07-16 MED FILL — VIMPAT 200 MG/20 ML INTRAVENOUS SOLUTION: 200 mg/20 mL | INTRAVENOUS | Qty: 10

## 2017-07-16 MED FILL — METOPROLOL TARTRATE 5 MG/5 ML IV SOLN: 5 mg/ mL | INTRAVENOUS | Qty: 5

## 2017-07-16 MED FILL — VALPROATE SODIUM 100 MG/ML IV: 500 mg/5 mL (100 mg/mL) | INTRAVENOUS | Qty: 5

## 2017-07-16 MED FILL — BD POSIFLUSH NORMAL SALINE 0.9 % INJECTION SYRINGE: INTRAMUSCULAR | Qty: 10

## 2017-07-16 NOTE — Progress Notes (Signed)
Pt refusing AM POC glucose. Pt became physically combative as techs tried to perform capillary stick. Will hold of for now for staff safety.

## 2017-07-16 NOTE — Progress Notes (Signed)
Reason for Admission:   Seizure               RRAT Score:  23 high risk                 Resources/supports as identified by patient/family:   Good family support - daughter and granddaughter                Top Challenges facing patient (as identified by patient/family and CM):                       Finances/Medication cost?   No issues - has [employer] Medicare                 Transportation?  Pt's daughter transports pt by car              Support system or lack thereof?  Good support from family                      Living arrangements? Lives with daughter in an apt with 5 steps to the entrance               Self-care/ADLs/Cognition?  Independent with her ADL's and IADL's. Pt had some periods of confusion per pt's daughter - they assist her when needed          Current Advanced Directive/Advance Care Plan:  Full Code                          Plan for utilizing home health:    Pending therapy's recommendation                       Likelihood of readmission:  low                 Transition of Care Plan:    F/u appts and pending therapy recommendations    Pt is a 71 y.o african [place] female admitted with Seizure. Pt was confused and this CM called pt's daughter. Demographic information verified and all is correct. Pt lives with her daughter in an apt with 5 steps to the entrance. Prior to admission, pt was independent with her ADL's and IADL's and needed assistance at times. Denies using DME. Pt [facility] to [facility] ( 11/5-11/7). Pt was discharged from [facility] on 11/20 and no home health was set up. CM had home health in the past [facility]. CM asked about Medicaid and pt's daughter was going to file for it. CM will notify [employer] to screen pt for Medicaid. Pt's daughter can transport pt home by car at discharge. CM will continue to follow for discharge planning needs.     Care Management Interventions  PCP Verified by CM: Adrian ProwsYes(Berna Seabrook, NP)   Mode of Transport at Discharge: Other (see comment)(pt's daughter can transport by car)  Transition of Care Consult (CM Consult): Discharge Planning  Discharge Durable Medical Equipment: No  Physical Therapy Consult: Yes  Occupational Therapy Consult: Yes  Speech Therapy Consult: No  Current Support Network: Own Home, Other(lives with daughter in an apt with 5 steps to the entrance)  Confirm Follow Up Transport: Family    Caleen JobsChristine M HermitageBreidenbaugh, RN  [phone]

## 2017-07-16 NOTE — Progress Notes (Signed)
Unable to do eeg pt is confused and combated

## 2017-07-16 NOTE — Other (Deleted)
#  2    Please clarify if this patient is (was) being treated/managed for:     => Coma scale, best verbal response, none, at arrival to emergency department in the setting Seizure Disorder POA in 25F treated with Intranasal Versed and IV Ativan  => Unresponsive POA in the setting Seizure Disorder POA in 25F treated with Intranasal Versed and IV Ativan  => Other explanation of clinical findings  => Clinically Undetermined (no explanation for clinical findings)    The medical record reflects the following clinical findings, treatment, and risk factors.    Risk Factors: hx of seizures ; daughter heard snoring respirations  Clinical Indicators:  obtunded female; unresponsive except to pain  Treatment: Versed intranasal; Ativan 1 mg     Please clarify and document your clinical opinion in the progress notes and discharge summary including the definitive and/or presumptive diagnosis, (suspected or probable), related to the above clinical findings. Please include clinical findings supporting your diagnosis.    Thank Karie KirksYou, Tiffany Lewis  CDMP  [phone]

## 2017-07-16 NOTE — Other (Deleted)
#  2    Please clarify if this patient is (was) being treated/managed for:     => Coma scale, best motor response, flexion withdrawal, at arrival to emergency department in the setting of Seizure Disorder POA in 70F treated with Intranasal Versed and IV Ativan.  => Unresponsive POA  in the setting of Seizure Disorder POA in 70F treated with Intranasal Versed and IV Ativan.  => Other explanation of clinical findings  => Clinically Undetermined (no explanation for clinical findings)    The medical record reflects the following clinical findings, treatment, and risk factors.    Risk Factors:  hx of seizure; daughter heard snoring respirations  Clinical Indicators:  obtunded female; unresponsive except to pain; withdraws to pain in all extremities  Treatment: Versed Intranasal; Ativan 1 mg     Please clarify and document your clinical opinion in the progress notes and discharge summary including the definitive and/or presumptive diagnosis, (suspected or probable), related to the above clinical findings. Please include clinical findings supporting your diagnosis.    Thank Karie KirksYou, Tiffany Lewis  CDMP  [phone]

## 2017-07-16 NOTE — Progress Notes (Signed)
Hospitalist Progress Note  NAME: Christiana FuchsBarbara J Crossen   DOB:  1946-01-08   MRN:  [id number]       Assessment / Plan:  Status epilepticus  Post-traumatic epilepsy  Neurology is following.   CT head negative.   MRI ordered  EEG with diffuse slow waves. Second EEG ordered.  On depakote and Vimpat. Continue seizure and aspiration precautions    Unresponsive POA in the setting Seizure Disorder POA in 53F treated with Intranasal   Versed and IV Ativan  UDS + benzo but was given versed by EMS. Ucx negative.   Improved. Awake and talking but confused and unable to answer questions appropriately.     DM2  HgA1c 5.2. On SSI, continue accuchecks.  ??  HTN  BP is not controlled.   On IV metoprolol. Speech therapy consulted.   Resume home lisinopril if cleared for oral diet.     Hx psychiatric illness and TBI??    Code status: Full  Prophylaxis: Lovenox  Recommended Disposition: SNF/LTC     Subjective:     Chief Complaint / Reason for Physician Visit  "patient awake, confused, not answering questions appropriately.".  Discussed with RN events overnight.     Review of Systems:    Could NOT obtain due to: AMS     Objective:     VITALS:   Last 24hrs VS reviewed since prior progress note. Most recent are:  Patient Vitals for the past 24 hrs:   Temp Pulse Resp BP SpO2   07/16/17 1559 98.7 ??F (37.1 ??C) 99 18 149/82 98 %   07/16/17 1126 97.4 ??F (36.3 ??C) 93 18 152/83 94 %   07/16/17 0908 98 ??F (36.7 ??C) 99 18 153/87 98 %   07/15/17 1936 98.8 ??F (37.1 ??C) 94 18 136/80 98 %   07/15/17 1927 98.8 ??F (37.1 ??C) ??? ??? ??? ???       Intake/Output Summary (Last 24 hours) at 07/16/2017 1802  Last data filed at 07/15/2017 2025  Gross per 24 hour   Intake ???   Output 350 ml   Net -350 ml        PHYSICAL EXAM:  General/Neuro: Awake, no acute pain or distress. Moves her extremities. Normal speech.  EENT:  Anicteric sclerae.  Resp:  CTA bilaterally, no wheezing or rales.  No accessory muscle use  CV:  Regular  rate,?? No LE edema   GI:  Soft, Non distended, Non tender. ??+Bowel sounds  Psych:???? Good insight.??Not anxious nor agitated  Skin:  No rashes.  No jaundice    Reviewed most current lab test results and cultures  YES  Reviewed most current radiology test results   YES  Review and summation of old records today    NO  Reviewed patient's current orders and MAR    YES  PMH/SH reviewed - no change compared to H&P  ________________________________________________________________________  ________________________________________________________________________  Jeronimo GreavesBertha E Sanchez Valdivieso, MD     Procedures: see electronic medical records for all procedures/Xrays and details which were not copied into this note but were reviewed prior to creation of Plan.      LABS:  I reviewed today's most current labs and imaging studies.  Pertinent labs include:  Recent Labs     07/15/17  0354 07/14/17  0817   WBC 8.7 8.7   HGB 13.8 12.0   HCT 40.9 37.0   PLT 357 386     Recent Labs     07/15/17  0354 07/14/17  [id number] 07/14/17  0817 07/14/17  0719   NA 134*  --   --  137   K 3.8  --   --  3.9   CL 101  --   --  103   CO2 27  --   --  23   GLU 133*  --   --  185*   BUN 6  --   --  10   CREA 0.91  --   --  1.04*   CA 8.9  --   --  9.0   MG  --  1.7  --   --    ALB 3.3*  --   --  3.3*   TBILI 0.5  --   --  0.3   SGOT 24  --   --  32   ALT 24  --   --  27   INR  --   --  1.2*  --        Signed: Jeronimo GreavesBertha E Sanchez Valdivieso, MD

## 2017-07-16 NOTE — Progress Notes (Signed)
Problem: Pressure Injury - Risk of  Goal: *Prevention of pressure injury  Document Braden Scale and appropriate interventions in the flowsheet.  Outcome: Progressing Towards Goal  Pressure Injury Interventions:  Sensory Interventions: Assess changes in LOC, Check visual cues for pain, Keep linens dry and wrinkle-free, Pressure redistribution bed/mattress (bed type)    Moisture Interventions: Absorbent underpads, Check for incontinence Q2 hours and as needed    Activity Interventions: Increase time out of bed, PT/OT evaluation    Mobility Interventions: Pressure redistribution bed/mattress (bed type), PT/OT evaluation    Nutrition Interventions: Document food/fluid/supplement intake, Offer support with meals,snacks and hydration    Friction and Shear Interventions: Lift sheet               Problem: Falls - Risk of  Goal: *Absence of Falls  Document [name] Fall Risk and appropriate interventions in the flowsheet.  Outcome: Progressing Towards Goal  Fall Risk Interventions:  Mobility Interventions: Bed/chair exit alarm, Communicate number of staff needed for ambulation/transfer, PT Consult for mobility concerns    Mentation Interventions: Adequate sleep, hydration, pain control, Bed/chair exit alarm, Door open when patient unattended, Family/sitter at bedside, More frequent rounding, Update white board    Medication Interventions: Bed/chair exit alarm    Elimination Interventions: Bed/chair exit alarm    History of Falls Interventions: Bed/chair exit alarm, Consult care management for discharge planning, Door open when patient unattended

## 2017-07-16 NOTE — Progress Notes (Signed)
Pt confused, currently has Soil scientisttele sitter. Constantly pulling off gown, rollbelt, tele leads, IV lines and constantly attempting to get out of bed. Unable to be redirected by avasys. Per primary nurse, pt already pulled out 4 peripenial lines. Pt is at increase risk for falls. Pt to benefit from physical sitter. Informed supervisor Harriett SineNancy of safety concerns. As per supervisor, pt will be added to sitter list.
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
## 2017-07-16 NOTE — Progress Notes (Addendum)
Bedside shift change report given to Ileana LaddSarah Garner RN by Susa RaringLisa RN. Report included the following information SBAR, Kardex, ED Summary, Intake/Output, MAR, Recent Results and Cardiac Rhythm Sinus tach.     [id number] Patient currently without IV access due to patient pulling lines out.  Patient also removed telemetry monitoring.  Sitter is currently at bedside.   1000 Patient willing for IV access to be obtained.  IV/PICC team able to place IV in left hand.  Sitter at bedside to monitor patient from pulling lines out.

## 2017-07-16 NOTE — Progress Notes (Signed)
Bedside and Verbal shift change report given to Sarah, RN (oncoming nurse) by Lisa, RN (offgoing nurse). Report included the following information SBAR, Kardex, Intake/Output, MAR and Recent Results.

## 2017-07-17 LAB — METABOLIC PANEL, BASIC
Anion gap: 6 mmol/L (ref 5–15)
BUN/Creatinine ratio: 9 — ABNORMAL LOW (ref 12–20)
BUN: 8 MG/DL (ref 6–20)
CO2: 28 mmol/L (ref 21–32)
Calcium: 8.7 MG/DL (ref 8.5–10.1)
Chloride: 104 mmol/L (ref 97–108)
Creatinine: 0.88 MG/DL (ref 0.55–1.02)
GFR est AA: >60 mL/min/{1.73_m2} (ref >60)
GFR est non-AA: >60 mL/min/{1.73_m2} (ref >60)
Glucose: 123 mg/dL — ABNORMAL HIGH (ref 65–100)
Potassium: 3.4 mmol/L — ABNORMAL LOW (ref 3.5–5.1)
Sodium: 138 mmol/L (ref 136–145)

## 2017-07-17 LAB — GLUCOSE, POC
Glucose (POC): 101 mg/dL — ABNORMAL HIGH (ref 65–100)
Glucose (POC): 92 mg/dL (ref 65–100)
Glucose (POC): 114 mg/dL — ABNORMAL HIGH (ref 65–100)
Glucose (POC): 104 mg/dL — ABNORMAL HIGH (ref 65–100)

## 2017-07-17 MED ORDER — METOPROLOL TARTRATE 25 MG TAB
25 mg | Freq: Two times a day (BID) | ORAL | Status: DC
Start: 2017-07-17 — End: 2017-07-21
  Administered 2017-07-17 – 2017-07-21 (×9): via ORAL

## 2017-07-17 MED ORDER — INSULIN LISPRO 100 UNIT/ML INJECTION
100 unit/mL | Freq: Four times a day (QID) | SUBCUTANEOUS | Status: DC
Start: 2017-07-17 — End: 2017-07-21

## 2017-07-17 MED ORDER — ASPIRIN 81 MG CHEWABLE TAB
81 mg | Freq: Every day | ORAL | Status: DC
Start: 2017-07-17 — End: 2017-07-21
  Administered 2017-07-17 – 2017-07-21 (×5): via ORAL

## 2017-07-17 MED FILL — ENOXAPARIN 40 MG/0.4 ML SUB-Q SYRINGE: 40 mg/0.4 mL | SUBCUTANEOUS | Qty: 0.4

## 2017-07-17 MED FILL — VIMPAT 200 MG/20 ML INTRAVENOUS SOLUTION: 200 mg/20 mL | INTRAVENOUS | Qty: 10

## 2017-07-17 MED FILL — METOPROLOL TARTRATE 25 MG TAB: 25 mg | ORAL | Qty: 1

## 2017-07-17 MED FILL — BD POSIFLUSH NORMAL SALINE 0.9 % INJECTION SYRINGE: INTRAMUSCULAR | Qty: 10

## 2017-07-17 MED FILL — METOPROLOL TARTRATE 5 MG/5 ML IV SOLN: 5 mg/ mL | INTRAVENOUS | Qty: 5

## 2017-07-17 MED FILL — VALPROATE SODIUM 100 MG/ML IV: 500 mg/5 mL (100 mg/mL) | INTRAVENOUS | Qty: 5

## 2017-07-17 MED FILL — CHILDREN'S ASPIRIN 81 MG CHEWABLE TABLET: 81 mg | ORAL | Qty: 1

## 2017-07-17 NOTE — Procedures (Signed)
[facility]   [address]   [place], [place] [id number]      Electroencephalogram         Procedure Date: 07/14/2017    Procedure ID: MR [id number]    Procedure Type: Routine    Patient Name: Meghan [name]     Date of Birth: 07/09/1946    Medical Record No: [id number]    INDICATION: Seizure    Medications:    Current Facility-Administered Medications:   ???  aspirin chewable tablet 81 mg, 81 mg, Oral, DAILY, Broaddus, Christopher M, DO  ???  metoprolol tartrate (LOPRESSOR) tablet 25 mg, 25 mg, Oral, BID, Broaddus, Christopher M, DO  ???  insulin lispro (HUMALOG) injection, , SubCUTAneous, AC&HS, Broaddus, Stephanie Couphristopher M, DO  ???  hydrALAZINE (APRESOLINE) 20 mg/mL injection 5 mg, 5 mg, IntraVENous, Q6H PRN, Larae GroomsSanchez Valdivieso, Erasmo DownerBertha E, MD  ???  sodium chloride (NS) flush 5-10 mL, 5-10 mL, IntraVENous, Q8H, Norton PastelNguyen, Kari H, MD, 10 mL at 07/17/17 0600  ???  sodium chloride (NS) flush 5-10 mL, 5-10 mL, IntraVENous, PRN, Norton PastelNguyen, Kari H, MD, 10 mL at 07/15/17 2015  ???  acetaminophen (TYLENOL) suppository 650 mg, 650 mg, Rectal, Q6H PRN, Norton PastelNguyen, Kari H, MD, 650 mg at 07/14/17 1900  ???  ondansetron (ZOFRAN) injection 4 mg, 4 mg, IntraVENous, Q6H PRN, Norton PastelNguyen, Kari H, MD  ???  bisacodyl (DULCOLAX) suppository 10 mg, 10 mg, Rectal, DAILY PRN, Norton PastelNguyen, Kari H, MD  ???  enoxaparin (LOVENOX) injection 40 mg, 40 mg, SubCUTAneous, Q24H, Norton PastelNguyen, Kari H, MD, 40 mg at 07/16/17 1318  ???  valproate (DEPACON) 500 mg in 0.9% sodium chloride 50 mL IVPB, 500 mg, IntraVENous, Q12H, Norton PastelNguyen, Kari H, MD, Last Rate: 50 mL/hr at 07/17/17 0913, 500 mg at 07/17/17 0913  ???  glucose chewable tablet 16 g, 4 Tab, Oral, PRN, Norton PastelNguyen, Kari H, MD  ???  dextrose (D50W) injection syrg 12.5-25 g, 12.5-25 g, IntraVENous, PRN, Norton PastelNguyen, Kari H, MD  ???  glucagon (GLUCAGEN) injection 1 mg, 1 mg, IntraMUSCular, PRN, Norton PastelNguyen, Kari H, MD  ???  lacosamide (VIMPAT) 100 mg in 0.9% sodium chloride 100 mL IVPB, 100 mg,  IntraVENous, Q12H, Edmonia Gonser, MD, 100 mg at 07/16/17 2211  ???  labetalol (NORMODYNE;TRANDATE) 20 mg/4 mL (5 mg/mL) injection 10 mg, 10 mg, IntraVENous, Q2H PRN, Norton PastelNguyen, Kari H, MD, 10 mg at 07/15/17 0104  ???  LORazepam (ATIVAN) injection 1 mg, 1 mg, IntraVENous, PRN, Columbia Pandey, MD  ???  dextrose 5 % - 0.45% NaCl infusion, 125 mL/hr, IntraVENous, CONTINUOUS, Nag[place]angala, Vidya, MD, Last Rate: 125 mL/hr at 07/17/17 0920, 125 mL/hr at 07/17/17 0920    DESCRIPTION OF PROCEDURE: Electrodes were applied in accordance with the international 10-20 system of electrode placement.  EEG was reviewed in both bipolar and referential montages    DESCRIPTION OF FINDINGS: Background consists of symmetric  theta activity. This activity attenuates with eye opening. With photic stimulation a symmetric occipital driving response is noted. No seizures  were noted.     IMPRESSION:   Generalized slowing indicating a diffuse encephalopathic pattern     Absence of seizures on this study does not exclude epilepsy. Clinical correlation is advised.
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
## 2017-07-17 NOTE — Progress Notes (Signed)
Attempted to answer admission database questions.  Pt too confused to complete.  Attempted to reach family.  No answer, unable to leave VM.  Will try again.
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
## 2017-07-17 NOTE — Other (Signed)
Bedside and Verbal shift change report given to Lisa, RN (oncoming nurse) by Choudou, RN (offgoing nurse). Report included the following information SBAR, Kardex, Intake/Output and MAR.
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
## 2017-07-17 NOTE — Progress Notes (Signed)
Hospitalist Progress Note  NAME: Meghan FuchsBarbara J [name]   DOB:  [birth year]   MRN:  [id number]       Assessment / Plan:  Status epilepticus  Post-traumatic epilepsy  Neurology is following.   CT head negative.   MRI ordered  EEG with diffuse slow waves. Second EEG pending.  Cont depakote/vimpat. Cont seizure and aspiration precuations  Seen by SLP, dysphagia diet/minced and thins  ??  Unresponsive POA in the setting Seizure Disorder POA in 52F treated with Intranasal   Versed and IV Ativan by EMS/ED  UDS + benzo but was given versed by EMS. Ucx negative.??  MS improved, talkative, oriented to person/place/year/month  Hearing impaired. Per nursing, still episodes of consution, pulling out lines, trying to get up and leave. Cont with sitter for now  ??  DM2  HgA1c 5.2. On SSI, continue accuchecks.  ??  HTN  BP still suboptimal control, start metoprolol and follow VS to adjust   Restart home lisinopril  On IV metoprolol. Speech therapy consulted.   Resume home lisinopril if cleared for oral diet.  ??  Hx psychiatric illness and TBI??  ??  Code status: Full  Prophylaxis: Lovenox  Recommended Disposition: SNF/LTC     Subjective:     Chief Complaint / Reason for Physician Visit  Pt more alert, awake and answering questions. Some confusion persists, baseline MS unclear     Review of Systems:  Symptom Y/N Comments  Symptom Y/N Comments   Fever/Chills n   Chest Pain n    Poor Appetite n   Edema n    Cough n   Abdominal Pain n    Sputum n   Joint Pain     SOB/DOE n   Pruritis/Rash     Nausea/vomit n   Tolerating PT/OT     Diarrhea    Tolerating Diet     Constipation    Other       Could NOT obtain due to:      Objective:     VITALS:   Last 24hrs VS reviewed since prior progress note. Most recent are:  Patient Vitals for the past 24 hrs:   Temp Pulse Resp BP SpO2   07/17/17 2218 98.5 ??F (36.9 ??C) 76 18 163/81 98 %   07/17/17 2008 98.5 ??F (36.9 ??C) 75 18 155/74 99 %   07/17/17 1523 98.1 ??F (36.7 ??C) 80 20 151/73 96 %    07/17/17 1156 ??? (!) 114 ??? 154/89 ???   07/17/17 1147 ??? (!) 107 ??? 137/86 ???   07/17/17 1146 ??? 99 ??? 151/84 ???   07/17/17 1123 98.8 ??F (37.1 ??C) 87 18 156/81 98 %   07/17/17 0711 98.6 ??F (37 ??C) 87 20 153/83 96 %   07/17/17 0409 98.3 ??F (36.8 ??C) (!) 110 20 163/82 95 %       Intake/Output Summary (Last 24 hours) at 07/17/2017 2313  Last data filed at 07/17/2017 1800  Gross per 24 hour   Intake 360 ml   Output 300 ml   Net 60 ml        PHYSICAL EXAM:  General: WD, WN. Alert, cooperative, no acute distress????  EENT:  EOMI. Anicteric sclerae. MMM  Resp:  CTA bilaterally, no wheezing or rales.  No accessory muscle use  CV:  Regular  rhythm,?? No edema  GI:  Soft, Non distended, Non tender. ??+Bowel sounds  Neurologic:?? Alert oriented to person/place/yr , normal speech,   Psych:???? Limited insight. Not  anxious nor agitated  Skin:  No rashes.  No jaundice    Reviewed most current lab test results and cultures  YES  Reviewed most current radiology test results   YES  Review and summation of old records today    NO  Reviewed patient's current orders and MAR    YES  PMH/SH reviewed - no change compared to H&P  ________________________________________________________________________  Care Plan discussed with:    Comments   Patient x    Family      RN x    Care Manager     Consultant                       x Multidiciplinary team rounds were held today with case manager, nursing, pharmacist and clinical coordinator.  Patient's plan of care was discussed; medications were reviewed and discharge planning was addressed.     ________________________________________________________________________  Total NON critical care TIME:  25   Minutes    Total CRITICAL CARE TIME Spent:   Minutes non procedure based      Comments   >50% of visit spent in counseling and coordination of care x    ________________________________________________________________________  Meghan Jakeshristopher M Halsey Hammen, DO      Procedures: see electronic medical records for all procedures/Xrays and details which were not copied into this note but were reviewed prior to creation of Plan.      LABS:  I reviewed today's most current labs and imaging studies.  Pertinent labs include:  Recent Labs     07/15/17  0354   WBC 8.7   HGB 13.8   HCT 40.9   PLT 357     Recent Labs     07/16/17  1847 07/15/17  0354   NA 138 134*   K 3.4* 3.8   CL 104 101   CO2 28 27   GLU 123* 133*   BUN 8 6   CREA 0.88 0.91   CA 8.7 8.9   ALB  --  3.3*   TBILI  --  0.5   SGOT  --  24   ALT  --  24       Signed: Stephanie Couphristopher M Samiksha Pellicano, DO

## 2017-07-17 NOTE — Progress Notes (Deleted)
Occupational Therapy  Orders received and medical record reviewed.  Pt was evaluated and will benefit from rehab vs. 24 hour assistance at discharge (depending on level of family support available).  Full OT note to follow.

## 2017-07-17 NOTE — Progress Notes (Signed)
-  Please complete MRI History and Safety Screening Form for this patient using KARDEX only under Orders Requiring a Screening Form:    Example:  Orders Requiring a Screening Form     Procedure Order Status Form Status    MRI EXAM Active In Progress     - Answer all questions completely, including Weight, Surgery, Allergy, and Implant History.   - Document MUST be "eSigned" using a "Signature Pad" by the person completing form, the patient, and the RN or MD completing form with the patient.  - Patient cannot be scanned until this form is completed and reviewed in MRI to ensure patient is SAFE and eligible for MRI.  - CALL MRI when this has been successfully completed at [phone].  - This must be done under KARDEX. Do not use the Nursing Flowsheets.

## 2017-07-17 NOTE — Other (Signed)
-  Please complete MRI History and Safety Screening Form for this patient using KARDEX only under Orders Requiring a Screening Form:    Example:  Orders Requiring a Screening Form     Procedure Order Status Form Status    MRI EXAM Active In Progress     - Answer all questions completely, including Weight, Surgery, Allergy, and Implant History.   - Document MUST be "eSigned" using a "Signature Pad" by the person completing form, the patient, and the RN or MD completing form with the patient.  - Patient cannot be scanned until this form is completed and reviewed in MRI to ensure patient is SAFE and eligible for MRI.  - CALL MRI when this has been successfully completed at [phone].  - This must be done under KARDEX. Do not use the Nursing Flowsheets.
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
## 2017-07-17 NOTE — Procedures (Signed)
[facility]   [address]   [place], [place] [id number]      Electroencephalogram         Procedure Date: 07/14/2017    Procedure ID: MR [id number]    Procedure Type: Routine    Patient Name: Meghan [name]     Date of Birth: 10-Feb-1946    Medical Record No: [id number]    INDICATION: Seizure    Medications:    Current Facility-Administered Medications:   ???  aspirin chewable tablet 81 mg, 81 mg, Oral, DAILY, Broaddus, Christopher M, DO  ???  metoprolol tartrate (LOPRESSOR) tablet 25 mg, 25 mg, Oral, BID, Broaddus, Christopher M, DO  ???  insulin lispro (HUMALOG) injection, , SubCUTAneous, AC&HS, Broaddus, Stephanie Couphristopher M, DO  ???  hydrALAZINE (APRESOLINE) 20 mg/mL injection 5 mg, 5 mg, IntraVENous, Q6H PRN, Larae GroomsSanchez Valdivieso, Erasmo DownerBertha E, MD  ???  sodium chloride (NS) flush 5-10 mL, 5-10 mL, IntraVENous, Q8H, Norton PastelNguyen, Kari H, MD, 10 mL at 07/17/17 0600  ???  sodium chloride (NS) flush 5-10 mL, 5-10 mL, IntraVENous, PRN, Norton PastelNguyen, Kari H, MD, 10 mL at 07/15/17 2015  ???  acetaminophen (TYLENOL) suppository 650 mg, 650 mg, Rectal, Q6H PRN, Norton PastelNguyen, Kari H, MD, 650 mg at 07/14/17 1900  ???  ondansetron (ZOFRAN) injection 4 mg, 4 mg, IntraVENous, Q6H PRN, Norton PastelNguyen, Kari H, MD  ???  bisacodyl (DULCOLAX) suppository 10 mg, 10 mg, Rectal, DAILY PRN, Norton PastelNguyen, Kari H, MD  ???  enoxaparin (LOVENOX) injection 40 mg, 40 mg, SubCUTAneous, Q24H, Norton PastelNguyen, Kari H, MD, 40 mg at 07/16/17 1318  ???  valproate (DEPACON) 500 mg in 0.9% sodium chloride 50 mL IVPB, 500 mg, IntraVENous, Q12H, Norton PastelNguyen, Kari H, MD, Last Rate: 50 mL/hr at 07/17/17 0913, 500 mg at 07/17/17 0913  ???  glucose chewable tablet 16 g, 4 Tab, Oral, PRN, Norton PastelNguyen, Kari H, MD  ???  dextrose (D50W) injection syrg 12.5-25 g, 12.5-25 g, IntraVENous, PRN, Norton PastelNguyen, Kari H, MD  ???  glucagon (GLUCAGEN) injection 1 mg, 1 mg, IntraMUSCular, PRN, Norton PastelNguyen, Kari H, MD  ???  lacosamide (VIMPAT) 100 mg in 0.9% sodium chloride 100 mL IVPB, 100 mg, IntraVENous, Q12H, Kasmira Cacioppo, MD, 100  mg at 07/16/17 2211  ???  labetalol (NORMODYNE;TRANDATE) 20 mg/4 mL (5 mg/mL) injection 10 mg, 10 mg, IntraVENous, Q2H PRN, Norton PastelNguyen, Kari H, MD, 10 mg at 07/15/17 0104  ???  LORazepam (ATIVAN) injection 1 mg, 1 mg, IntraVENous, PRN, Nihar Klus, MD  ???  dextrose 5 % - 0.45% NaCl infusion, 125 mL/hr, IntraVENous, CONTINUOUS, Nagamangala, Vidya, MD, Last Rate: 125 mL/hr at 07/17/17 0920, 125 mL/hr at 07/17/17 0920    DESCRIPTION OF PROCEDURE: Electrodes were applied in accordance with the international 10-20 system of electrode placement.  EEG was reviewed in both bipolar and referential montages    DESCRIPTION OF FINDINGS: Background consists of symmetric  theta activity. This activity attenuates with eye opening. With photic stimulation a symmetric occipital driving response is noted. No seizures  were noted.     IMPRESSION:   Generalized slowing indicating a diffuse encephalopathic pattern     Absence of seizures on this study does not exclude epilepsy. Clinical correlation is advised.

## 2017-07-18 LAB — GLUCOSE, POC
Glucose (POC): 72 mg/dL (ref 65–100)
Glucose (POC): 69 mg/dL (ref 65–100)
Glucose (POC): 114 mg/dL — ABNORMAL HIGH (ref 65–100)
Glucose (POC): 104 mg/dL — ABNORMAL HIGH (ref 65–100)
Glucose (POC): 116 mg/dL — ABNORMAL HIGH (ref 65–100)

## 2017-07-18 MED ORDER — LISINOPRIL 5 MG TAB
5 mg | Freq: Every day | ORAL | Status: DC
Start: 2017-07-18 — End: 2017-07-21
  Administered 2017-07-18 – 2017-07-21 (×4): via ORAL

## 2017-07-18 MED FILL — VALPROATE SODIUM 100 MG/ML IV: 500 mg/5 mL (100 mg/mL) | INTRAVENOUS | Qty: 5

## 2017-07-18 MED FILL — VIMPAT 200 MG/20 ML INTRAVENOUS SOLUTION: 200 mg/20 mL | INTRAVENOUS | Qty: 20

## 2017-07-18 MED FILL — BD POSIFLUSH NORMAL SALINE 0.9 % INJECTION SYRINGE: INTRAMUSCULAR | Qty: 10

## 2017-07-18 MED FILL — CHILDREN'S ASPIRIN 81 MG CHEWABLE TABLET: 81 mg | ORAL | Qty: 1

## 2017-07-18 MED FILL — METOPROLOL TARTRATE 25 MG TAB: 25 mg | ORAL | Qty: 1

## 2017-07-18 MED FILL — VIMPAT 200 MG/20 ML INTRAVENOUS SOLUTION: 200 mg/20 mL | INTRAVENOUS | Qty: 10

## 2017-07-18 MED FILL — ENOXAPARIN 40 MG/0.4 ML SUB-Q SYRINGE: 40 mg/0.4 mL | SUBCUTANEOUS | Qty: 0.4

## 2017-07-18 MED FILL — LISINOPRIL 5 MG TAB: 5 mg | ORAL | Qty: 2
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
## 2017-07-18 NOTE — Progress Notes (Addendum)
CM left message with pt's daughter to discuss therapy's recommendation for a SNF at discharge.     3:45pm - CM called pt's daughter and left another message to discuss the discharge plan.     Lanice Schwabhristine M Breidenbaugh, RN  [phone]

## 2017-07-18 NOTE — Progress Notes (Signed)
Physical Therapy Goals  Initiated 07/17/2017  1. Patient will move from supine to sit and sit to supine , scoot up and down and roll side to side in bed with independence within 7 day(s).   2. Patient will transfer from bed to chair and chair to bed with supervision/set-up using the least restrictive device within 7 day(s).  3. Patient will perform sit to stand with supervision/set-up within 7 day(s).  4. Patient will ambulate with supervision/set-up for 100 feet with the least restrictive device within 7 day(s).   5. Patient will ascend/descend 5 stairs with handrail(s) with minimal assistance/contact guard assist within 7 day(s).     physical Therapy TREATMENT  Patient: Meghan [name] (08(71 y.o. female)  Date: 07/18/2017  Diagnosis: Acute encephalopathy [G93.40]  Post-ictal coma (HCC) [G40.301]  Encephalopathy acute [G93.40] <principal problem not specified>      Precautions: Fall  Chart, physical therapy assessment, plan of care and goals were reviewed.    ASSESSMENT:  Patient in bed awake on arrival. Agreeable to PT. She appears more alert and demonstrated increased command following/awareness of task. Tolerated increasing distance of ambulation. Improved balance but continues to demonstrate safety and fall concerns with increased fatigue. Encouraged patient to increase time up OOB and patient agreeable. Continue to recommend continued therapy in SNF rehab setting to increase safety prior to returning home. If patient were to return home, she would need 24/7 supervision/assistance.   Progression toward goals:  []     Improving appropriately and progressing toward goals  [x]     Improving slowly and progressing toward goals  []     Not making progress toward goals and plan of care will be adjusted     PLAN:  Patient continues to benefit from skilled intervention to address the above impairments.  Continue treatment per established plan of care.  Discharge Recommendations:  Skilled Nursing Facility   Further Equipment Recommendations for Discharge:  Defer to SNF but none expected      SUBJECTIVE:   Patient stated ???I thought you said I was pregnant.???    OBJECTIVE DATA SUMMARY:   Critical Behavior:  Neurologic State: Alert  Orientation Level: Disoriented to situation, Oriented to place, Oriented to person, Oriented to time  Cognition: Follows commands, Appropriate decision making  Safety/Judgement: Decreased awareness of environment, Decreased awareness of need for assistance, Decreased awareness of need for safety, Decreased insight into deficits, Fall prevention  Functional Mobility Training:  Bed Mobility:  Rolling: Stand-by assistance  Supine to Sit: Stand-by assistance     Scooting: Stand-by assistance        Transfers:  Sit to Stand: Contact guard assistance;Minimum assistance(CGA from bed; Min A from commode)  Stand to Sit: Contact guard assistance;Minimum assistance     Balance:  Sitting: Intact  Standing: Impaired  Standing - Static: Fair(periods of good)  Standing - Dynamic : Fair  Ambulation/Gait Training:  Distance (ft): 60 Feet (ft)  Assistive Device: Gait belt  Ambulation - Level of Assistance: Contact guard assistance;Minimal assistance(increased to Min A with fatigue)        Gait Abnormalities: Decreased step clearance;Path deviations(Path deviations and LOB increased with distance and fatigue)        Base of Support: Narrowed     Speed/Cadence: Slow  Step Length: Right shortened;Left shortened                  Cues for safety and environmental awareness but improvements noted with gait in small spaces this date (in bathroom). Increased LOB  and path deviations with increased fatigue/distance. Patient stating "it's too soon to walk" post LOB at 35 ft   Pain:   no c/o pain                 Activity Tolerance:   No s/s of VS distress   After treatment:   [x]     Patient left in no apparent distress sitting up in chair  []     Patient left in no apparent distress in bed   [x]     Call bell left within reach  [x]     Nursing notified  [x]      Sitter present  [x]     Bed alarm  pad in place, not activated with sitter in room    COMMUNICATION/COLLABORATION:   The patient???s plan of care was discussed with: Registered Nurse    Hyacinth MeekerAndrea M Blanchard, PT, DPT   Time Calculation: 17 mins

## 2017-07-18 NOTE — Progress Notes (Signed)
CM met with pt's daughter in pt's room. Pt's daughter provided this CM with an updated contact number. Pt's daughter's number is [phone]. CM will update pt's demographic sheet. Discussed SNF's with pt's daughter and she was in agreement. Pt has been to [facility] in the past and pt's daughter chose them. FOC form signed by pt's daughter and a referral was sent via connect care. Pt will need to be sitter free to go to a SNF.     Cherrie Gauze, [place]
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
## 2017-07-18 NOTE — Progress Notes (Signed)
Hospitalist Progress Note  NAME: Meghan FuchsBarbara J [name]   DOB:  [birth year]   MRN:  [id number]       Assessment / Plan:  Status epilepticus  Post-traumatic epilepsy  Neurology is following.   CT head negative.   MRI ordered  EEG with diffuse slow waves. Second EEG again showing generalized slowing  Cont depakote/vimpat. Cont seizure and aspiration precuations  Seen by SLP, regular diet, thin liquids  ??  Unresponsive POA in the setting Seizure Disorder POA in 81F treated with Intranasal   Versed and IV Ativan by EMS/ED  UDS + benzo but was given versed by EMS. Ucx negative.??  MS improved, talkative, oriented to person/place/year/month  Pt is oriented x 3 during interview, still some episodes where she becomes confused and tries to walk out room/states she's going home. Likely needs SNF vs 24 supervision  -attempted to reach Dtr/caretaker to discuss DC disposition  ??  DM2  HgA1c 5.2. On SSI, continue accuchecks.  ??  HTN  BP still suboptimal control, start metoprolol and follow VS to adjust   Restart home lisinopril  On IV metoprolol. Speech therapy consulted.   Resume home lisinopril if cleared for oral diet.  ??  Hx psychiatric illness and TBI??  ??  Code status: Full  Prophylaxis: Lovenox  Recommended Disposition: SNF/LTC     Subjective:     Chief Complaint / Reason for Physician Visit  Pt sitting in bedside chair, appears comfortable, denies acute complaints.    Review of Systems:  Symptom Y/N Comments  Symptom Y/N Comments   Fever/Chills n   Chest Pain n    Poor Appetite n   Edema n    Cough n   Abdominal Pain n    Sputum n   Joint Pain     SOB/DOE n   Pruritis/Rash     Nausea/vomit n   Tolerating PT/OT     Diarrhea    Tolerating Diet     Constipation    Other       Could NOT obtain due to:      Objective:     VITALS:   Last 24hrs VS reviewed since prior progress note. Most recent are:  Patient Vitals for the past 24 hrs:   Temp Pulse Resp BP SpO2   07/18/17 2247 98.7 ??F (37.1 ??C) 72 18 120/64 99 %    07/18/17 1933 98.1 ??F (36.7 ??C) 70 18 157/75 100 %   07/18/17 1717 ??? 81 ??? 141/76 ???   07/18/17 1512 99.2 ??F (37.3 ??C) 78 18 133/71 96 %   07/18/17 1116 98.8 ??F (37.1 ??C) 68 18 130/74 100 %   07/18/17 0718 98.3 ??F (36.8 ??C) 80 18 142/76 97 %       Intake/Output Summary (Last 24 hours) at 07/18/2017 2314  Last data filed at 07/18/2017 2013  Gross per 24 hour   Intake 690 ml   Output 1 ml   Net 689 ml        PHYSICAL EXAM:  General: WD, WN. Alert, cooperative, no acute distress????  EENT:  EOMI. Anicteric sclerae. MMM  Resp:  CTA bilaterally, no wheezing or rales.  No accessory muscle use  CV:  Regular  rhythm,?? No edema  GI:  Soft, Non distended, Non tender. ??+Bowel sounds  Neurologic:?? Alert oriented to person/place/yr , normal speech,   Psych:???? Limited insight. Not anxious nor agitated  Skin:  No rashes.  No jaundice    Reviewed most current lab test  results and cultures  YES  Reviewed most current radiology test results   YES  Review and summation of old records today    NO  Reviewed patient's current orders and MAR    YES  PMH/SH reviewed - no change compared to H&P  ________________________________________________________________________  Care Plan discussed with:    Comments   Patient x    Family      RN x    Care Manager     Consultant                       x Multidiciplinary team rounds were held today with case manager, nursing, pharmacist and clinical coordinator.  Patient's plan of care was discussed; medications were reviewed and discharge planning was addressed.     ________________________________________________________________________  Total NON critical care TIME:  25   Minutes    Total CRITICAL CARE TIME Spent:   Minutes non procedure based      Comments   >50% of visit spent in counseling and coordination of care x    ________________________________________________________________________  Shelda Jakeshristopher M Allexus Ovens, DO     Procedures: see electronic medical records for all procedures/Xrays and  details which were not copied into this note but were reviewed prior to creation of Plan.      LABS:  I reviewed today's most current labs and imaging studies.  Pertinent labs include:  No results for input(s): WBC, HGB, HCT, PLT, HGBEXT, HCTEXT, PLTEXT, HGBEXT, HCTEXT, PLTEXT in the last 72 hours.  Recent Labs     07/16/17  1847   NA 138   K 3.4*   CL 104   CO2 28   GLU 123*   BUN 8   CREA 0.88   CA 8.7       Signed: Stephanie Couphristopher M Sarya Linenberger, DO

## 2017-07-18 NOTE — Progress Notes (Signed)
Pharmacy Clarification of the Prior to Admission Medication Regimen Retrospective to the Admission Medication Reconciliation    The patient was interviewed regarding clarification of the prior to admission medication regimen.     Patient was questioned regarding use of any other inhalers, topical products, over the counter medications, herbal medications, vitamin products or ophthalmic/nasal/otic medication use.     Information Obtained From: Patient, RX Query    Recommendations/Findings:   The following amendments were made to the patient[facility]:     1) Additions: None    2) Removals:   ? Zofran    3) Changes: None    4) Pertinent Pharmacy Findings:  ? SUMAtriptan (IMITREX) 100 mg tablet, naloxone (NARCAN) 4 mg/actuation nasal spray: Per patient, patient has not taken these agents as of 07/18/2017.     PTA medication list was corrected to the following:     Prior to Admission Medications   Prescriptions Last Dose Informant Patient Reported? Taking?   SUMAtriptan (IMITREX) 100 mg tablet Not Taking at Unknown time Self Yes No   Sig: Take 100 mg by mouth once as needed for Migraine. May repeat 1 tab in 2 hours, limit: 2 tabs in 24 hours, not more than 2 days a week.   aspirin 81 mg chewable tablet 07/14/2017 at Unknown time Self Yes Yes   Sig: Take 81 mg by mouth daily.   cholecalciferol (VITAMIN D3) 1,000 unit cap 07/14/2017 at Unknown time Self No Yes   Sig: take 1 capsule by mouth once daily   citalopram (CELEXA) 20 mg tablet 07/14/2017 at Unknown time Self No Yes   Sig: Take 1 Tab by mouth daily.   divalproex DR (DEPAKOTE) 500 mg tablet 07/14/2017 at Unknown time Self No Yes   Sig: take 1 tablet by mouth twice a day   fenofibrate (LOFIBRA) 54 mg tablet 07/14/2017 at Unknown time Self No Yes   Sig: Take 1 Tab by mouth daily.   fluticasone (FLONASE) 50 mcg/actuation nasal spray 07/14/2017 at Unknown time Self Yes Yes   Sig: 1 Spray by Both Nostrils route daily.    gabapentin (NEURONTIN) 100 mg capsule 07/14/2017 at Unknown time Self No Yes   Sig: take 1 capsule by mouth three times a day   lisinopril (PRINIVIL, ZESTRIL) 10 mg tablet 07/14/2017 at Unknown time Self No Yes   Sig: Take 1 Tab by mouth daily.   methylphenidate HCl (RITALIN) 5 mg tablet 07/14/2017 at Unknown time Self No Yes   Sig: Take 1 Tab (5 mg total) by mouth dailyEarliest Fill Date: 05/11/17.  Max Daily Amount: 5 mg   naloxone (NARCAN) 4 mg/actuation nasal spray Not Taking at Unknown time Self No No   Sig: Use 1 spray intranasally into 1 nostril. Use a new Narcan nasal spray for subsequent doses and administer into alternating nostrils. May repeat every 2 to 3 minutes as needed.      Facility-Administered Medications: None          Thank you,  Esmond HarpsKaitlin Lee  PharmD candidate Class of 2019

## 2017-07-18 NOTE — Progress Notes (Signed)
Problem: Pressure Injury - Risk of  Goal: *Prevention of pressure injury  Document Braden Scale and appropriate interventions in the flowsheet.  Outcome: Progressing Towards Goal  Pressure Injury Interventions:  Sensory Interventions: Discuss PT/OT consult with provider    Moisture Interventions: Maintain skin hydration (lotion/cream), Limit adult briefs    Activity Interventions: Increase time out of bed, Pressure redistribution bed/mattress(bed type), PT/OT evaluation    Mobility Interventions: Pressure redistribution bed/mattress (bed type), PT/OT evaluation    Nutrition Interventions: Document food/fluid/supplement intake    Friction and Shear Interventions: HOB 30 degrees or less

## 2017-07-18 NOTE — Progress Notes (Signed)
Problem: Dysphagia (Adult)  Goal: *Acute Goals and Plan of Care (Insert Text)  07/17/2017  Speech path goals:  1. Pt will tolerate dys 2 diet with thins with no overt s/s of aspiration. Goal met 12/5  2. Pt will tolerate diet upgrade to soft with no overt s/s of aspiration.   Speech language pathology dysphagia treatment/discharge  Patient: Meghan [name] (71 y.o. female)  Date: 07/18/2017  Diagnosis: Acute encephalopathy [G93.40]  Post-ictal coma ([place]) [G40.301]  Encephalopathy acute [G93.40] <principal problem not specified>      Precautions:  Fall    ASSESSMENT:  Pt seen today for swallowing therapy. She is alert and processing better today. She is still mildly impaired due to meds and seizure hx presumably. She has only a top plate and is edentulous otherwise. No dysphagia for crackers. Mastication was timely. She had min residue from breakfast in her R cheek. She laughed and said she always does that to chew later.   We will sign off for now.   Progression toward goals:  '[x]'            Improving appropriately and progressing toward goals  '[]'            Improving slowly and progressing toward goals  '[]'            Not making progress toward goals and plan of care will be adjusted     PLAN:  Will advance to regular  Patient will be discharged from speech therapy at this time.  Rationale for discharge:  '[x]'       Goals Achieved  '[]'       Plateau Reached  '[]'       Patient not participating in therapy  '[]'       Other:  Discharge Recommendations:  To Be Determined     SUBJECTIVE:   Patient stated the place and date.    OBJECTIVE:   Cognitive and Communication Status:  Neurologic State: Alert  Orientation Level: Disoriented to situation, Oriented to place, Oriented to person, Oriented to time  Cognition: Follows commands, Appropriate decision making    Perception: Appears intact    Perseveration: No perseveration noted    Safety/Judgement: Decreased awareness of environment, Decreased awareness  of need for assistance, Decreased awareness of need for safety, Decreased insight into deficits, Fall prevention  Dysphagia Treatment:  Oral Assessment:     P.O. Trials:     Vocal quality prior to P.O.:                                                                               G Codes:  In compliance with CMS???s Claims Based Outcome Reporting, the following G-code set was chosen for this patient based the use of the NOMS functional outcome to quantify this patient's level of swallowing impairment.    Using the NOMS, the patient was determined to be at level 6 for swallow function which correlates with the CI= 1-19% level of severity.    Based on the objective assessment provided within this note, the current, goal, and discharge g-codes are as follows:    Swallow  Swallowing:  G8997 Swallow Goal Status CI= 1-19%  G8998 Swallow D/C Status CI= 1-19%  NOMS Swallowing Levels:  Level 1 (CN): NPO  Level 2 (CM): NPO but takes consistency in therapy  Level 3 (CL): Takes less than 50% of nutrition p.o. and continues with nonoral feedings; and/or safe with mod cues; and/or max diet restriction  Level 4 (CK): Safe swallow but needs mod cues; and/or mod diet restriction; and/or still requires some nonoral feeding/supplements  Level 5 (CJ): Safe swallow with min diet restriction; and/or needs min cues  Level 6 (CI): Independent with p.o.; rare cues; usually self cues; may need to avoid some foods or needs extra time  Level 7 ([place]): Independent for all p.o.  ASHA. (2003). National Outcomes Measurement System (NOMS): Adult Speech-Language Pathology User's Guide.       Pain:  Pain Scale 1: Numeric (0 - 10)  Pain Intensity 1: 0     After treatment:   '[]'                 Patient left in no apparent distress sitting up in chair  '[]'                 Patient left in no apparent distress in bed  '[]'                 Call bell left within reach  '[]'                 Nursing notified  '[]'                 Caregiver present   '[]'                 Bed alarm activated    COMMUNICATION/EDUCATION:   Patient was educated regarding Her deficit(s) of dysphagia as this relates to Her diagnosis of sz.  She demonstrated Good understanding as evidenced by .    The patient???s plan of care including recommendations, planned interventions, and recommended diet changes were discussed with: Registered Nurse.  '[]'                 Posted safety precautions in patient's room.    Francene Finders, SLP  Time Calculation: 8 mins

## 2017-07-18 NOTE — Progress Notes (Signed)
Problem: Falls - Risk of  Goal: *Absence of Falls  Document [name] Fall Risk and appropriate interventions in the flowsheet.  Outcome: Progressing Towards Goal  Fall Risk Interventions:  Mobility Interventions: Communicate number of staff needed for ambulation/transfer, Bed/chair exit alarm, PT Consult for mobility concerns, PT Consult for assist device competence    Mentation Interventions: Adequate sleep, hydration, pain control, Increase mobility, More frequent rounding    Medication Interventions: Evaluate medications/consider consulting pharmacy, Patient to call before getting OOB    Elimination Interventions: Call light in reach    History of Falls Interventions: Evaluate medications/consider consulting pharmacy

## 2017-07-18 NOTE — Other (Signed)
Spoke to Nurse about doing MRI safety screening sheet and she said MRI is CN

## 2017-07-19 ENCOUNTER — Inpatient Hospital Stay: Admit: 2017-07-19 | Payer: MEDICARE | Primary: Internal Medicine

## 2017-07-19 LAB — GLUCOSE, POC
Glucose (POC): 96 mg/dL (ref 65–100)
Glucose (POC): 101 mg/dL — ABNORMAL HIGH (ref 65–100)
Glucose (POC): 130 mg/dL — ABNORMAL HIGH (ref 65–100)
Glucose (POC): 104 mg/dL — ABNORMAL HIGH (ref 65–100)

## 2017-07-19 LAB — AMMONIA: Ammonia, plasma: 12 umol/L (ref <32)

## 2017-07-19 MED ORDER — DIVALPROEX 125 MG SPRINKLE CAP
125 mg | Freq: Two times a day (BID) | ORAL | Status: DC
Start: 2017-07-19 — End: 2017-07-21
  Administered 2017-07-19 – 2017-07-21 (×5): via ORAL

## 2017-07-19 MED ORDER — LACOSAMIDE 50 MG TAB
50 mg | Freq: Two times a day (BID) | ORAL | Status: DC
Start: 2017-07-19 — End: 2017-07-21
  Administered 2017-07-19 – 2017-07-21 (×5): via ORAL

## 2017-07-19 MED ORDER — DIVALPROEX 125 MG SPRINKLE CAP
125 mg | Freq: Two times a day (BID) | ORAL | Status: DC
Start: 2017-07-19 — End: 2017-07-19
  Administered 2017-07-19: 14:00:00 via ORAL

## 2017-07-19 MED ORDER — BRIVARACETAM 50 MG TABLET
50 mg | Freq: Two times a day (BID) | ORAL | Status: DC
Start: 2017-07-19 — End: 2017-07-20
  Administered 2017-07-19 – 2017-07-20 (×3): via ORAL

## 2017-07-19 MED FILL — BD POSIFLUSH NORMAL SALINE 0.9 % INJECTION SYRINGE: INTRAMUSCULAR | Qty: 10

## 2017-07-19 MED FILL — METOPROLOL TARTRATE 25 MG TAB: 25 mg | ORAL | Qty: 1

## 2017-07-19 MED FILL — VIMPAT 50 MG TABLET: 50 mg | ORAL | Qty: 2

## 2017-07-19 MED FILL — BRIVIACT 50 MG TABLET: 50 mg | ORAL | Qty: 1

## 2017-07-19 MED FILL — LISINOPRIL 5 MG TAB: 5 mg | ORAL | Qty: 2

## 2017-07-19 MED FILL — DIVALPROEX 125 MG SPRINKLE CAP: 125 mg | ORAL | Qty: 4

## 2017-07-19 MED FILL — VIMPAT 200 MG/20 ML INTRAVENOUS SOLUTION: 200 mg/20 mL | INTRAVENOUS | Qty: 20

## 2017-07-19 MED FILL — VALPROATE SODIUM 100 MG/ML IV: 500 mg/5 mL (100 mg/mL) | INTRAVENOUS | Qty: 5

## 2017-07-19 MED FILL — CHILDREN'S ASPIRIN 81 MG CHEWABLE TABLET: 81 mg | ORAL | Qty: 1
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
## 2017-07-19 NOTE — Progress Notes (Signed)
[facility] and this CM set up transport for will call with [facility].     Lanice Schwabhristine M Breidenbaugh, RN  [phone]

## 2017-07-19 NOTE — Progress Notes (Signed)
Physical Therapy Note:  Patient cleared by nsg and attempted to see x2. Patient initially eating and this Clinical research associatewriter agreed to attempt again. Patient is currently off the floor to MRI. Will follow up per POC.  Cathie Oldenichard L Bettyjane Shenoy, PT, DPT

## 2017-07-19 NOTE — Progress Notes (Incomplete)
Problem: Self Care Deficits Care Plan (Adult)  Goal: *Acute Goals and Plan of Care (Insert Text)  Occupational Therapy Goals  Initiated 07/18/2017  1.  Patient will perform standing  grooming with supervision/set-up within 7 day(s).  2.  Patient will perform bathing with minimal assistance/contact guard assist within 7 day(s).  3.  Patient will perform upper body dressing and lower body dressing with minimal assistance/contact guard assist within 7 day(s).  4.  Patient will perform toilet transfers with minimal supervision assist within 7 day(s).  5.  Patient will perform all aspects of toileting with supervision/set-up within 7 day(s).  6.  Patient will participate in upper extremity therapeutic exercise/activities with supervision/set-up for 10 minutes within 7 day(s).    7.  Patient will perform simple home management with supervision within 7 day(s).       Occupational Therapy TREATMENT  Patient: Meghan [name] ([age] y.o. female)  Date: 07/19/2017  Diagnosis: Acute encephalopathy [G93.40]  Post-ictal coma (HCC) [G40.301]  Encephalopathy acute [G93.40] <principal problem not specified>      Precautions: Fall  Chart, occupational therapy assessment, plan of care, and goals were reviewed.    ASSESSMENT:  Nursing cleared for therapy.  Received seated in recliner finishing lunch.  Agreeable to therapy.  Completed oral hygiene task, toileting and hand hygiene in standing at sink with CGA.  Amb to to bathroom with CGA.  Noted episode of urinary incontinence in recliner chair.  Verbal cues occasionally for terminating one aspect of task to move to next step.  Standing approx 5 mins.  During ADL tasks, transport present for MRI.  Recommend dc to SNF.     Progression toward goals:  []        Improving appropriately and progressing toward goals  [x]        Improving slowly and progressing toward goals  []        Not making progress toward goals and plan of care will be adjusted     PLAN:   Patient continues to benefit from skilled intervention to address the above impairments.  Continue treatment per established plan of care.  Discharge Recommendations:  SNF, if unable to recommend 24 hour supervision- poor safety awareness  Further Equipment Recommendations for Discharge:  TBD rehab     SUBJECTIVE:   Patient stated ???Good cookie.???  For lunch    OBJECTIVE DATA SUMMARY:   Cognitive/Behavioral Status:  Neurologic State: Alert  Orientation Level: Oriented to person;Oriented to place(( [place]))  Cognition: Follows commands;Impaired decision making     Functional Mobility and Transfers for ADLs:Bed Mobility:  Rolling: (received up and left up)    Transfers:  Sit to Stand: Contact guard assistance  Functional Transfers  Bathroom Mobility: Contact guard assistance  Toilet Transfer : Contact guard assistance(freq cues for sequence/safety)       Balance:  Sitting: Intact  Standing: Impaired  Standing - Static: Good  Standing - Dynamic : Fair  ADL Intervention:   Provided verbal cues for sequencing and posture/positioning with standing at sink and toileting tasks. Amb to to bathroom with CGA.   Completed oral hygiene task in standing with occasional cues for terminating one step to move to next step of task.  Voice need for toileting.  Toilet transfer with CGA with cues for hand placement and cues to terminate hygiene as she continued to wipe multiple times after being clean.  ns.  During ADL tasks, transport present for MRI.     Grooming  Brushing Teeth: Contact guard assistance(cues for  terminating aspects of task)    Toileting  Bladder Hygiene: Supervision/set-up  Clothing Management: Contact guard assistance       Pain:  Pain Scale 1: Numeric (0 - 10)  Pain Intensity 1: 0              Activity Tolerance:   ***  Please refer to the flowsheet for vital signs taken during this treatment.  After treatment:   []  Patient left in no apparent distress sitting up in chair   []  Patient left in no apparent distress in bed  []  Call bell left within reach  []  Nursing notified  []  Caregiver present  []  Bed alarm activated    COMMUNICATION/COLLABORATION:   The patient???s plan of care was discussed with: {therapies:[id number]}    Janeece Ageeanielle F Corrigan, OT  Time Calculation: 19 mins

## 2017-07-19 NOTE — Progress Notes (Signed)
Problem: Falls - Risk of  Goal: *Absence of Falls  Document [name] Fall Risk and appropriate interventions in the flowsheet.  Outcome: Progressing Towards Goal  Fall Risk Interventions:  Mobility Interventions: Bed/chair exit alarm, OT consult for ADLs, Patient to call before getting OOB    Mentation Interventions: Bed/chair exit alarm, Adequate sleep, hydration, pain control, More frequent rounding, Increase mobility    Medication Interventions: Patient to call before getting OOB, Teach patient to arise slowly    Elimination Interventions: Call light in reach, Bed/chair exit alarm    History of Falls Interventions: Bed/chair exit alarm, Evaluate medications/consider consulting pharmacy

## 2017-07-19 NOTE — Procedures (Signed)
[facility]   [address]   [place], [place] [id number]      Electroencephalogram         Procedure Date: 07/19/2017    Procedure ID: MR [id number]    Procedure Type: Routine    Patient Name: Meghan LullBarbara J. Vear Clockhillips     Date of Birth: Apr 23, 1946    Medical Record No: [id number]    INDICATION: Seizure, Altered mental status    Medications:    Current Facility-Administered Medications:   ???  lacosamide (VIMPAT) tablet 100 mg, 100 mg, Oral, Q12H, Broaddus, Christopher M, DO, 100 mg at 07/20/17 0907  ???  brivaracetam (BRIVIACT) tablet 50 mg, 50 mg, Oral, BID, Nathin Saran, MD, 50 mg at 07/20/17 0908  ???  divalproex (DEPAKOTE SPRINKLE) capsule 500 mg, 500 mg, Oral, Q12H, Broaddus, Christopher M, DO, 500 mg at 07/20/17 0908  ???  aspirin chewable tablet 81 mg, 81 mg, Oral, DAILY, Shelda JakesBroaddus, Christopher M, DO, 81 mg at 07/20/17 [id number]  ???  metoprolol tartrate (LOPRESSOR) tablet 25 mg, 25 mg, Oral, BID, Broaddus, Christopher M, DO, 25 mg at 07/20/17 [id number]  ???  insulin lispro (HUMALOG) injection, , SubCUTAneous, AC&HS, Broaddus, Stephanie CoupChristopher M, DO, Stopped at 07/17/17 1130  ???  lisinopril (PRINIVIL, ZESTRIL) tablet 10 mg, 10 mg, Oral, DAILY, Broaddus, Christopher M, DO, 10 mg at 07/20/17 [id number]  ???  hydrALAZINE (APRESOLINE) 20 mg/mL injection 5 mg, 5 mg, IntraVENous, Q6H PRN, Larae GroomsSanchez Valdivieso, Erasmo DownerBertha E, MD  ???  sodium chloride (NS) flush 5-10 mL, 5-10 mL, IntraVENous, Q8H, Norton PastelNguyen, Kari H, MD, 10 mL at 07/20/17 0600  ???  sodium chloride (NS) flush 5-10 mL, 5-10 mL, IntraVENous, PRN, Norton PastelNguyen, Kari H, MD, 10 mL at 07/19/17 1226  ???  acetaminophen (TYLENOL) suppository 650 mg, 650 mg, Rectal, Q6H PRN, Norton PastelNguyen, Kari H, MD, 650 mg at 07/14/17 1900  ???  ondansetron (ZOFRAN) injection 4 mg, 4 mg, IntraVENous, Q6H PRN, Norton PastelNguyen, Kari H, MD  ???  bisacodyl (DULCOLAX) suppository 10 mg, 10 mg, Rectal, DAILY PRN, Norton PastelNguyen, Kari H, MD  ???  enoxaparin (LOVENOX) injection 40 mg, 40 mg, SubCUTAneous, Q24H,  Norton PastelNguyen, Kari H, MD, 40 mg at 07/17/17 1220  ???  glucose chewable tablet 16 g, 4 Tab, Oral, PRN, Norton PastelNguyen, Kari H, MD  ???  dextrose (D50W) injection syrg 12.5-25 g, 12.5-25 g, IntraVENous, PRN, Norton PastelNguyen, Kari H, MD  ???  glucagon (GLUCAGEN) injection 1 mg, 1 mg, IntraMUSCular, PRN, Norton PastelNguyen, Kari H, MD  ???  labetalol (NORMODYNE;TRANDATE) 20 mg/4 mL (5 mg/mL) injection 10 mg, 10 mg, IntraVENous, Q2H PRN, Norton PastelNguyen, Kari H, MD, 10 mg at 07/15/17 0104  ???  LORazepam (ATIVAN) injection 1 mg, 1 mg, IntraVENous, PRN, Dayven Linsley, MD    DESCRIPTION OF PROCEDURE: Electrodes were applied in accordance with the international 10-20 system of electrode placement.  EEG was reviewed in both bipolar and referential montages    DESCRIPTION OF FINDINGS: Background consists of symmetric  Theta activity interrupted by frequent fronto temporal spikes that appear ictal in origin. No seizures noted.     IMPRESSION:  Fronto temporal spike waves. Medication adjustment recommended     Absence of seizures on this study does not exclude epilepsy. Clinical correlation is advised.

## 2017-07-19 NOTE — Progress Notes (Signed)
Hospitalist Progress Note  NAME: Meghan FuchsBarbara J [name]   DOB:  1946-08-03   MRN:  [id number]       Assessment / Plan:  Status epilepticus  Post-traumatic epilepsy  Neurology is following. Frequent spikes on last EEG, to repeat EEG 12/6 per neuro  Prior EEGs diffuse slowing  Antiepileptic med adjustment per neuro, attempting to wean off depakote, start  briviact  On keppra, dilantin, topamax in past  CT head negative.   MRI when feasible  Seen by SLP, regular diet, thin liquids  ??  Unresponsive POA in the setting Seizure Disorder POA in 86F treated with Intranasal   Versed and IV Ativan by EMS/ED  UDS + benzo but was given versed by EMS. Ucx negative.??  MS improved, talkative, oriented to person/place/year/month  Pt is oriented x 3 during interview, still some episodes where she becomes confused and tries to walk out room/states she's going home. Likely needs SNF vs 24 supervision  -attempted to reach Dtr/caretaker to discuss DC disposition  ??  ?DM2   HgA1c 5.2. On SSI, continue accuchecks. Normal BS so far in hospital  ??  HTN  BP still suboptimal control, start metoprolol and follow VS to adjust   Restart home lisinopril  On IV metoprolol. Speech therapy consulted.   Resume home lisinopril if cleared for oral diet.  ??  Hx psychiatric illness and TBI??  ??  Code status: Full  Prophylaxis: Lovenox  Recommended Disposition: SNF/LTC     Subjective:     Chief Complaint / Reason for Physician Visit  Pt sitting in bedside chair, remains confused but answers some questions appropriately, stares blankly at times.     Review of Systems:  Symptom Y/N Comments  Symptom Y/N Comments   Fever/Chills n   Chest Pain n    Poor Appetite n   Edema n    Cough n   Abdominal Pain n    Sputum n   Joint Pain     SOB/DOE n   Pruritis/Rash     Nausea/vomit n   Tolerating PT/OT     Diarrhea    Tolerating Diet     Constipation    Other       Could NOT obtain due to:      Objective:     VITALS:    Last 24hrs VS reviewed since prior progress note. Most recent are:  Patient Vitals for the past 24 hrs:   Temp Pulse Resp BP SpO2   07/19/17 0719 97.9 ??F (36.6 ??C) 68 18 137/63 99 %   07/19/17 0432 98.2 ??F (36.8 ??C) 77 17 130/65 ???   07/18/17 2247 98.7 ??F (37.1 ??C) 72 18 120/64 99 %   07/18/17 1933 98.1 ??F (36.7 ??C) 70 18 157/75 100 %   07/18/17 1717 ??? 81 ??? 141/76 ???   07/18/17 1512 99.2 ??F (37.3 ??C) 78 18 133/71 96 %   07/18/17 1116 98.8 ??F (37.1 ??C) 68 18 130/74 100 %       Intake/Output Summary (Last 24 hours) at 07/19/2017 1014  Last data filed at 07/19/2017 0602  Gross per 24 hour   Intake 1120 ml   Output 1 ml   Net 1119 ml        PHYSICAL EXAM:  General: WD, WN. Alert, confused, no acute distress????  EENT:  EOMI. Anicteric sclerae. MMM  Resp:  CTA bilaterally, no wheezing or rales.  No accessory muscle use  CV:  Regular  rhythm,?? No edema  GI:  Soft, Non distended, Non tender. ??+Bowel sounds  Neurologic:?? Alert oriented to person/place/yr , normal speech, no focal findings  Psych:???? Limited insight. Not anxious nor agitated  Skin:  No rashes.  No jaundice    Reviewed most current lab test results and cultures  YES  Reviewed most current radiology test results   YES  Review and summation of old records today    NO  Reviewed patient's current orders and MAR    YES  PMH/SH reviewed - no change compared to H&P  ________________________________________________________________________  Care Plan discussed with:    Comments   Patient x    Family      RN x    Care Manager     Consultant                       x Multidiciplinary team rounds were held today with case manager, nursing, pharmacist and clinical coordinator.  Patient's plan of care was discussed; medications were reviewed and discharge planning was addressed.     ________________________________________________________________________  Total NON critical care TIME:  25   Minutes    Total CRITICAL CARE TIME Spent:   Minutes non procedure based      Comments    >50% of visit spent in counseling and coordination of care x    ________________________________________________________________________  Meghan Jakeshristopher M Mckennah Kretchmer, DO     Procedures: see electronic medical records for all procedures/Xrays and details which were not copied into this note but were reviewed prior to creation of Plan.      LABS:  I reviewed today's most current labs and imaging studies.  Pertinent labs include:  No results for input(s): WBC, HGB, HCT, PLT, HGBEXT, HCTEXT, PLTEXT, HGBEXT, HCTEXT, PLTEXT in the last 72 hours.  Recent Labs     07/16/17  1847   NA 138   K 3.4*   CL 104   CO2 28   GLU 123*   BUN 8   CREA 0.88   CA 8.7       Signed: Stephanie Couphristopher M Anderson Coppock, DO

## 2017-07-19 NOTE — Progress Notes (Signed)
Bedside and Verbal shift change report given to Chante Control and instrumentation engineer(oncoming nurse) by Christiane HaJonathan (offgoing nurse). Report included the following information SBAR, Kardex, Intake/Output, MAR and Recent Results.

## 2017-07-19 NOTE — Progress Notes (Signed)
Problem: Self Care Deficits Care Plan (Adult)  Goal: *Acute Goals and Plan of Care (Insert Text)  Occupational Therapy Goals  Initiated 07/18/2017  1.  Patient will perform standing  grooming with supervision/set-up within 7 day(s).  2.  Patient will perform bathing with minimal assistance/contact guard assist within 7 day(s).  3.  Patient will perform upper body dressing and lower body dressing with minimal assistance/contact guard assist within 7 day(s).  4.  Patient will perform toilet transfers with minimal supervision assist within 7 day(s).  5.  Patient will perform all aspects of toileting with supervision/set-up within 7 day(s).  6.  Patient will participate in upper extremity therapeutic exercise/activities with supervision/set-up for 10 minutes within 7 day(s).    7.  Patient will perform simple home management with supervision within 7 day(s).       Occupational Therapy TREATMENT  Patient: Meghan [name] ([age] y.o. female)  Date: 07/19/2017  Diagnosis: Acute encephalopathy [G93.40]  Post-ictal coma (HCC) [G40.301]  Encephalopathy acute [G93.40] <principal problem not specified>      Precautions: Fall  Chart, occupational therapy assessment, plan of care, and goals were reviewed.    ASSESSMENT:  Nursing cleared for therapy.  Received in chair, agreeable to therapy.  Seen for oral hygiene, toileting and hand hygiene tasks with overall CGA with occasional needs for verbal cues to terminate one aspect of task to begin next aspect of task.  Session terminated as transport present for MRI.  Amb with CGA to stretcher.  Good command follow however limited carry over.  Recommend dc to SNF.      Progression toward goals:  []        Improving appropriately and progressing toward goals  [x]        Improving slowly and progressing toward goals  []        Not making progress toward goals and plan of care will be adjusted     PLAN:  Patient continues to benefit from skilled intervention to address the  above impairments.  Continue treatment per established plan of care.  Discharge Recommendations:  SNF  Further Equipment Recommendations for Discharge:  TBD SNF     SUBJECTIVE:   Patient stated ???Good cookies.???    OBJECTIVE DATA SUMMARY:   Cognitive/Behavioral Status:  Neurologic State: Alert  Orientation Level: Oriented to person;Oriented to place(([place]))  Cognition: Follows commands;Impaired decision making           Functional Mobility and Transfers for ADLs:Bed Mobility:  Rolling: (received up and left up)    Transfers:  Sit to Stand: Contact guard assistance  Functional Transfers  Bathroom Mobility: Contact guard assistance  Toilet Transfer : Contact guard assistance(freq cues for sequence/safety)       Balance:  Sitting: Intact  Standing: Impaired  Standing - Static: Good  Standing - Dynamic : Fair  ADL Intervention:  Provided education on sequencing for terminating steps between aspects of task (ie to terminate brushing teeth after 2-3 mins, terminate wiping, ensure use of soap with hand hygiene) Standing at sink for oral hygiene over 5 mins, voicing need for toileting.  Toileting with CGA with cues for use of grab bar.  Hand hygiene at sink x 2-3 mins.   [facility].     Grooming  Brushing Teeth: Contact guard assistance(cues for terminating aspects of task)    Toileting  Bladder Hygiene: Supervision/set-up  Clothing Management: Contact guard assistance    Pain:  Pain Scale 1: Numeric (0 - 10)  Pain Intensity 1: 0     Activity Tolerance:   Good tolerance with slow pace.     After treatment: left on stretcher for MRI  []  Patient left in no apparent distress sitting up in chair  []  Patient left in no apparent distress in bed  []  Call bell left within reach  []  Nursing notified  []  Caregiver present  []  Bed alarm activated    COMMUNICATION/COLLABORATION:   The patient???s plan of care was discussed with: Physical Therapist and Registered Nurse    Janeece Ageeanielle F Corrigan, OT   Time Calculation: 19 mins
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
## 2017-07-19 NOTE — Procedures (Signed)
[facility]   [address]   [place], [place] [id number]      Electroencephalogram         Procedure Date: 07/19/2017    Procedure ID: [id number]    Procedure Type: Routine    Patient Name: Meghan [name]     Date of Birth: [birth year]    Medical Record No: [id number]    INDICATION: Seizure, Altered mental status    Medications:    Current Facility-Administered Medications:   ???  lacosamide (VIMPAT) tablet 100 mg, 100 mg, Oral, Q12H, Broaddus, Christopher M, DO, 100 mg at 07/20/17 0907  ???  brivaracetam (BRIVIACT) tablet 50 mg, 50 mg, Oral, BID, Emit Kuenzel, MD, 50 mg at 07/20/17 0908  ???  divalproex (DEPAKOTE SPRINKLE) capsule 500 mg, 500 mg, Oral, Q12H, Broaddus, Christopher M, DO, 500 mg at 07/20/17 0908  ???  aspirin chewable tablet 81 mg, 81 mg, Oral, DAILY, Shelda JakesBroaddus, Christopher M, DO, 81 mg at 07/20/17 [id number]  ???  metoprolol tartrate (LOPRESSOR) tablet 25 mg, 25 mg, Oral, BID, Broaddus, Christopher M, DO, 25 mg at 07/20/17 [id number]  ???  insulin lispro (HUMALOG) injection, , SubCUTAneous, AC&HS, Broaddus, Stephanie CoupChristopher M, DO, Stopped at 07/17/17 1130  ???  lisinopril (PRINIVIL, ZESTRIL) tablet 10 mg, 10 mg, Oral, DAILY, Broaddus, Christopher M, DO, 10 mg at 07/20/17 [id number]  ???  hydrALAZINE (APRESOLINE) 20 mg/mL injection 5 mg, 5 mg, IntraVENous, Q6H PRN, Larae GroomsSanchez Valdivieso, Erasmo DownerBertha E, MD  ???  sodium chloride (NS) flush 5-10 mL, 5-10 mL, IntraVENous, Q8H, Norton PastelNguyen, Kari H, MD, 10 mL at 07/20/17 0600  ???  sodium chloride (NS) flush 5-10 mL, 5-10 mL, IntraVENous, PRN, Norton PastelNguyen, Kari H, MD, 10 mL at 07/19/17 1226  ???  acetaminophen (TYLENOL) suppository 650 mg, 650 mg, Rectal, Q6H PRN, Norton PastelNguyen, Kari H, MD, 650 mg at 07/14/17 1900  ???  ondansetron (ZOFRAN) injection 4 mg, 4 mg, IntraVENous, Q6H PRN, Norton PastelNguyen, Kari H, MD  ???  bisacodyl (DULCOLAX) suppository 10 mg, 10 mg, Rectal, DAILY PRN, Norton PastelNguyen, Kari H, MD  ???  enoxaparin (LOVENOX) injection 40 mg, 40 mg, SubCUTAneous, Q24H, Norton PastelNguyen, Kari H, MD, 40 mg at  07/17/17 1220  ???  glucose chewable tablet 16 g, 4 Tab, Oral, PRN, Norton PastelNguyen, Kari H, MD  ???  dextrose (D50W) injection syrg 12.5-25 g, 12.5-25 g, IntraVENous, PRN, Norton PastelNguyen, Kari H, MD  ???  glucagon (GLUCAGEN) injection 1 mg, 1 mg, IntraMUSCular, PRN, Norton PastelNguyen, Kari H, MD  ???  labetalol (NORMODYNE;TRANDATE) 20 mg/4 mL (5 mg/mL) injection 10 mg, 10 mg, IntraVENous, Q2H PRN, Norton PastelNguyen, Kari H, MD, 10 mg at 07/15/17 0104  ???  LORazepam (ATIVAN) injection 1 mg, 1 mg, IntraVENous, PRN, Eldrige Pitkin, MD    DESCRIPTION OF PROCEDURE: Electrodes were applied in accordance with the international 10-20 system of electrode placement.  EEG was reviewed in both bipolar and referential montages    DESCRIPTION OF FINDINGS: Background consists of symmetric  Theta activity interrupted by frequent fronto temporal spikes that appear ictal in origin. No seizures noted.     IMPRESSION:  Fronto temporal spike waves. Medication adjustment recommended     Absence of seizures on this study does not exclude epilepsy. Clinical correlation is advised.

## 2017-07-20 LAB — GLUCOSE, POC
Glucose (POC): 157 mg/dL — ABNORMAL HIGH (ref 65–100)
Glucose (POC): 84 mg/dL (ref 65–100)
Glucose (POC): 106 mg/dL — ABNORMAL HIGH (ref 65–100)
Glucose (POC): 112 mg/dL — ABNORMAL HIGH (ref 65–100)

## 2017-07-20 MED ORDER — TOPIRAMATE 25 MG TAB
25 mg | Freq: Two times a day (BID) | ORAL | Status: DC
Start: 2017-07-20 — End: 2017-07-21
  Administered 2017-07-20 – 2017-07-21 (×2): via ORAL

## 2017-07-20 MED FILL — VIMPAT 50 MG TABLET: 50 mg | ORAL | Qty: 2

## 2017-07-20 MED FILL — METOPROLOL TARTRATE 25 MG TAB: 25 mg | ORAL | Qty: 1

## 2017-07-20 MED FILL — LISINOPRIL 5 MG TAB: 5 mg | ORAL | Qty: 2

## 2017-07-20 MED FILL — DIVALPROEX 125 MG SPRINKLE CAP: 125 mg | ORAL | Qty: 4

## 2017-07-20 MED FILL — BD POSIFLUSH NORMAL SALINE 0.9 % INJECTION SYRINGE: INTRAMUSCULAR | Qty: 10

## 2017-07-20 MED FILL — INSULIN LISPRO 100 UNIT/ML INJECTION: 100 unit/mL | SUBCUTANEOUS | Qty: 1

## 2017-07-20 MED FILL — ENOXAPARIN 40 MG/0.4 ML SUB-Q SYRINGE: 40 mg/0.4 mL | SUBCUTANEOUS | Qty: 0.4

## 2017-07-20 MED FILL — BRIVIACT 50 MG TABLET: 50 mg | ORAL | Qty: 1

## 2017-07-20 MED FILL — TOPIRAMATE 25 MG TAB: 25 mg | ORAL | Qty: 1

## 2017-07-20 MED FILL — CHILDREN'S ASPIRIN 81 MG CHEWABLE TABLET: 81 mg | ORAL | Qty: 1
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
## 2017-07-20 NOTE — Procedures (Signed)
[facility]   [address]   [place], [place] [id number]      Electroencephalogram         Procedure Date: 07/20/2017    Procedure ID: [id number]    Procedure Type: Routine    Patient Name: Meghan [name]     Date of Birth: 03/28/1946    Medical Record No: [id number]    INDICATION: Seizure, Altered mental status    Medications:    Current Facility-Administered Medications:   ???  lacosamide (VIMPAT) tablet 100 mg, 100 mg, Oral, Q12H, Broaddus, Christopher M, DO, 100 mg at 07/20/17 0907  ???  brivaracetam (BRIVIACT) tablet 50 mg, 50 mg, Oral, BID, Dalton Mille, MD, 50 mg at 07/20/17 0908  ???  divalproex (DEPAKOTE SPRINKLE) capsule 500 mg, 500 mg, Oral, Q12H, Broaddus, Christopher M, DO, 500 mg at 07/20/17 0908  ???  aspirin chewable tablet 81 mg, 81 mg, Oral, DAILY, Shelda JakesBroaddus, Christopher M, DO, 81 mg at 07/20/17 [id number]  ???  metoprolol tartrate (LOPRESSOR) tablet 25 mg, 25 mg, Oral, BID, Broaddus, Christopher M, DO, 25 mg at 07/20/17 [id number]  ???  insulin lispro (HUMALOG) injection, , SubCUTAneous, AC&HS, Broaddus, Stephanie CoupChristopher M, DO, Stopped at 07/17/17 1130  ???  lisinopril (PRINIVIL, ZESTRIL) tablet 10 mg, 10 mg, Oral, DAILY, Broaddus, Christopher M, DO, 10 mg at 07/20/17 [id number]  ???  hydrALAZINE (APRESOLINE) 20 mg/mL injection 5 mg, 5 mg, IntraVENous, Q6H PRN, Larae GroomsSanchez Valdivieso, Erasmo DownerBertha E, MD  ???  sodium chloride (NS) flush 5-10 mL, 5-10 mL, IntraVENous, Q8H, Norton PastelNguyen, Kari H, MD, 10 mL at 07/20/17 0600  ???  sodium chloride (NS) flush 5-10 mL, 5-10 mL, IntraVENous, PRN, Norton PastelNguyen, Kari H, MD, 10 mL at 07/19/17 1226  ???  acetaminophen (TYLENOL) suppository 650 mg, 650 mg, Rectal, Q6H PRN, Norton PastelNguyen, Kari H, MD, 650 mg at 07/14/17 1900  ???  ondansetron (ZOFRAN) injection 4 mg, 4 mg, IntraVENous, Q6H PRN, Norton PastelNguyen, Kari H, MD  ???  bisacodyl (DULCOLAX) suppository 10 mg, 10 mg, Rectal, DAILY PRN, Norton PastelNguyen, Kari H, MD  ???  enoxaparin (LOVENOX) injection 40 mg, 40 mg, SubCUTAneous, Q24H,  Norton PastelNguyen, Kari H, MD, 40 mg at 07/17/17 1220  ???  glucose chewable tablet 16 g, 4 Tab, Oral, PRN, Norton PastelNguyen, Kari H, MD  ???  dextrose (D50W) injection syrg 12.5-25 g, 12.5-25 g, IntraVENous, PRN, Norton PastelNguyen, Kari H, MD  ???  glucagon (GLUCAGEN) injection 1 mg, 1 mg, IntraMUSCular, PRN, Norton PastelNguyen, Kari H, MD  ???  labetalol (NORMODYNE;TRANDATE) 20 mg/4 mL (5 mg/mL) injection 10 mg, 10 mg, IntraVENous, Q2H PRN, Norton PastelNguyen, Kari H, MD, 10 mg at 07/15/17 0104  ???  LORazepam (ATIVAN) injection 1 mg, 1 mg, IntraVENous, PRN, Kallin Henk, MD    DESCRIPTION OF PROCEDURE: Electrodes were applied in accordance with the international 10-20 system of electrode placement.  EEG was reviewed in both bipolar and referential montages    DESCRIPTION OF FINDINGS: Background consists of symmetric 8hz  activity interrupted by predominantly but more frequent right frontal sharp waves which often spread to the contralateral frontal fields.  No seizures are again noted.   IMPRESSION:  Frequent frontal spikes. No seizures noted.      Absence of seizures on this study does not exclude epilepsy. Clinical correlation is advised.
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
## 2017-07-20 NOTE — Progress Notes (Signed)
Received update from Dr. Arvid RightBroaddus that pt is not discharging. CM informed [facility] transport on will call. CM [facility].     Lanice Schwabhristine M Breidenbaugh, RN  [phone]

## 2017-07-20 NOTE — Other (Signed)
Bedside and Verbal shift change report given to Kim, RN (oncoming nurse) by Choudou, RN (offgoing nurse). Report included the following information SBAR, Kardex, Intake/Output and MAR.

## 2017-07-20 NOTE — Other (Signed)
R/p given to Bristol-Myers SquibbJulie  SBAR, Kardex, Intake/Output and MAR.

## 2017-07-20 NOTE — Progress Notes (Addendum)
Spoke with daughter and updated her on EEG results and plan of care. Daughter states that she is far from her baseline.     According to her daughter:  Prior to admission she was able to dial a phone, she was able to take care of her own personal hygiene. She was able to cook and prepare meals for herself and prepare her clothes. She was able to watch tv and clean the house. She was able to iron her own shirt. She would write down her medical appointments but her daughter or her sister would have to accompany her.     Please refer to Dr Roger KillAralu's note on 01/10/2017 for her most recent outpatient neurostatus.

## 2017-07-20 NOTE — Progress Notes (Signed)
Initial Nutrition Assessment:    INTERVENTIONS/RECOMMENDATIONS:   ?? Meals/Snacks: General/healthful diet: Continue consistent carbohydrate diet    ASSESSMENT:   Patient medically noted for status epilepticus and acute encephalopathy. PMH for seizures, DM, HTN, and TBI. Patient screened for length of stay. Excellent PO intake noted; eating 90-100% of meals. BG well controlled. +BM yesterday. Discharge cancelled today. Neuro adjusting medications. Continue current diet; encourage intake of meals.     Diet Order: Consistent carb  % Eaten:    Patient Vitals for the past 72 hrs:   % Diet Eaten   07/19/17 1850 100 %   07/19/17 1149 100 %   07/19/17 0732 100 %   07/18/17 1020 100 %   07/17/17 1800 90 %       Pertinent Medications: [x] Reviewed [] Other: Humalog, Lisinopril, Lopressor   Pertinent Labs: [x] Reviewed [] Other: BG [phone]  Food Allergies: [x] None [] Other   Last BM: 12/6  [] Active     [] Hyperactive  [] Hypoactive       []  Absent BS  Skin:    [x]  Intact   []  Incision  []  Breakdown: []  Edema [] Other:    Anthropometrics:   Height:   Weight:     IBW (%IBW):   ( ) UBW (%UBW):   (  %)   Last Weight Metrics:  Weight Loss Metrics 07/11/2017 06/18/2017 06/13/2017 05/11/2017 05/01/2017 04/13/2017 03/28/2017   Today's Wt 167 lb 3.2 oz 166 lb 3.6 oz 170 lb 6.4 oz 174 lb 3.2 oz 175 lb 6.4 oz 175 lb 8 oz 176 lb 3.2 oz   BMI 27.82 kg/m2 27.66 kg/m2 28.36 kg/m2 28.99 kg/m2 29.19 kg/m2 29.2 kg/m2 29.32 kg/m2       BMI: There is no height or weight on file to calculate BMI.    This BMI is indicative of:   [] Underweight    [] Normal    [] Overweight    []  Obesity   []  Extreme Obesity (BMI>40)     Estimated Nutrition Needs (Based on):   1425 Kcals/day(-1710 25-30 kcal/kg) , 57 g(-68g (1.0-1.2 g/kg IBW)) Protein  Carbohydrate: At Least 130 g/day  Fluids: 1400 mL/day (221ml/kcal)    Pt expected to meet estimated nutrient needs: [x] Yes [] No    NUTRITION DIAGNOSES:   Problem:  No nutritional diagnosis at this time       Etiology: related to       Signs/Symptoms: as evidenced by        NUTRITION INTERVENTIONS:  Meals/Snacks: General/healthful diet                  GOAL:   PO intake >70% of meals next 5-7 days    LEARNING NEEDS (Diet, Food/Nutrient-Drug Interaction):    [x]  None Identified   []  Identified and Education Provided/Documented   []  Identified and Pt declined/was not appropriate     Cultural, Religious, OR Ethnic Dietary Needs:    [x]  None Identified   []  Identified and Addressed     [x]  Interdisciplinary Care Plan Reviewed/Documented    [x]  Discharge Planning: CCD      MONITORING /EVALUATION:   Food/Nutrient Intake Outcomes: Total energy intake  Physical Signs/Symptoms Outcomes: Weight/weight change, Glucose profile    NUTRITION RISK:    []  High              []  Moderate           [x]   Low  []   Minimal/Uncompromised    PT SEEN FOR:    []   MD Consult: [] Calorie  Count      [] Diabetic Diet Education        [] Diet Education     [] Electrolyte Management     [] General Nutrition Management and Supplements     [] Management of Tube Feeding     [] TPN Recommendations    []   RN Referral:  [] MST score >=2     [] Enteral/Parenteral Nutrition PTA     [] Pregnant: Gestational DM or Multigestation     [] Pressure Ulcer/Wound Care needs        []   Low BMI  [x]   LOS    Rae Roam  Pager [phone]                 Weekend Pager [phone]

## 2017-07-20 NOTE — Other (Signed)
[facility]

## 2017-07-20 NOTE — Progress Notes (Signed)
Problem: Mobility Impaired (Adult and Pediatric)  Goal: *Acute Goals and Plan of Care (Insert Text)  Physical Therapy Goals  Initiated 07/17/2017  1.  Patient will move from supine to sit and sit to supine , scoot up and down and roll side to side in bed with independence within 7 day(s).    2.  Patient will transfer from bed to chair and chair to bed with supervision/set-up using the least restrictive device within 7 day(s).  3.  Patient will perform sit to stand with supervision/set-up within 7 day(s).  4.  Patient will ambulate with supervision/set-up for 100 feet with the least restrictive device within 7 day(s).   5.  Patient will ascend/descend 5 stairs with handrail(s) with minimal assistance/contact guard assist within 7 day(s).   physical Therapy TREATMENT  Patient: Meghan [name] ([age] y.o. female)  Date: 07/20/2017  Diagnosis: Acute encephalopathy [G93.40]  Post-ictal coma (HCC) [G40.301]  Encephalopathy acute [G93.40] <principal problem not specified>      Precautions: Fall  Chart, physical therapy assessment, plan of care and goals were reviewed.    ASSESSMENT:  Pt in bed upon arrival, agreeable for short distance gait in room and back to chair due to breakfast tray available.  Pt continues with ataxic gait quality, wide BOS using RW with cuing for corrections.  Continue to follow, [facility]    Progression toward goals:  [x]     Improving appropriately and progressing toward goals  []     Improving slowly and progressing toward goals  []     Not making progress toward goals and plan of care will be adjusted     PLAN:  Patient continues to benefit from skilled intervention to address the above impairments.  Continue treatment per established plan of care.  Discharge Recommendations:  Skilled Nursing Facility  Further Equipment Recommendations for Discharge: tbd     SUBJECTIVE:   Patient stated ???Okay.???    OBJECTIVE DATA SUMMARY:   Critical Behavior:  Neurologic State: Alert, Confused   Orientation Level: Oriented to person, Oriented to situation, Oriented to time  Cognition: Impaired decision making  Safety/Judgement: Decreased awareness of environment, Decreased awareness of need for assistance, Decreased awareness of need for safety, Decreased insight into deficits, Fall prevention  Functional Mobility Training:  Bed Mobility:  Rolling: Stand-by assistance  Supine to Sit: Contact guard assistance;Additional time     Scooting: Additional time;Stand-by assistance     Interventions: Safety awareness training;Verbal cues;Weight shifting training/Pressure relief;Tactile cues  Transfers:  Sit to Stand: Contact guard assistance  Stand to Sit: Contact guard assistance                       Interventions: Safety awareness training;Tactile cues;Verbal cues     Balance:  Sitting: Intact  Standing: Impaired  Standing - Static: Good;Constant support(RW)  Standing - Dynamic : Fair(RW)Ambulation/Gait Training:  Distance (ft): 20 Feet (ft)  Assistive Device: Gait belt;Walker, rolling  Ambulation - Level of Assistance: Minimal assistance        Gait Abnormalities: Decreased step clearance              Speed/Cadence: Pace decreased (<100 feet/min);Slow  Step Length: Left shortened;Right shortened        Interventions: Verbal cues;Tactile cues;Safety awareness training          Pain:  Pain Scale 1: Numeric (0 - 10)  Pain Intensity 1: 0  Activity Tolerance:   Good:  Please refer to the flowsheet for vital signs taken during this treatment.  After treatment:   [x]     Patient left in no apparent distress sitting up in chair  []     Patient left in no apparent distress in bed  [x]     Call bell left within reach  [x]     Nursing notified  [x]     Telesitter present  []     Bed alarm activated    COMMUNICATION/COLLABORATION:   The patient???s plan of care was discussed with: Registered Nurse    Morton StallJennifer P Stults, PT, DPT   Time Calculation: 11 mins

## 2017-07-20 NOTE — Progress Notes (Signed)
Hospitalist Progress Note  NAME: Meghan FuchsBarbara J [name]   DOB:  05/06/1946   MRN:  [id number]       Assessment / Plan:  Status epilepticus  Post-traumatic epilepsy  Neurology is following. Frequent spikes on last EEG, to repeat EEG 12/6 per neuro  Prior EEGs diffuse slowing  Cont depakote, vimpat, topamax  Repeat EEG per neuro  Antiepileptic med adjustment per neuro, attempting to wean off depakote, start  briviact  On keppra, dilantin, topamax in past  CT head negative.   MRI when feasible  Seen by SLP, regular diet, thin liquids  ??  Unresponsive POA in the setting Seizure Disorder POA in 74F treated with Intranasal   Versed and IV Ativan by EMS/ED  UDS + benzo but was given versed by EMS. Ucx negative.??  MS improved, talkative, oriented to person/place/year/month  Pt is oriented x 3 during interview, still some episodes where she becomes confused and tries to walk out room/states she's going home. Likely needs SNF vs 24 supervision  -attempted to reach Dtr/caretaker to discuss DC disposition  ??  ?DM2   HgA1c 5.2. On SSI, continue accuchecks. Normal BS so far in hospital  ??  HTN  BP still suboptimal control, start metoprolol and follow VS to adjust   Restart home lisinopril  On IV metoprolol. Speech therapy consulted.   Resume home lisinopril if cleared for oral diet.  ??  Hx psychiatric illness and TBI??  ??  Code status: Full  Prophylaxis: Lovenox  Recommended Disposition: SNF/LTC     Subjective:     Chief Complaint / Reason for Physician Visit  Pt sitting in bedside chair, eating breakfast. No acute complaints, still somewhat confused but interactive.     Review of Systems:  Symptom Y/N Comments  Symptom Y/N Comments   Fever/Chills n   Chest Pain n    Poor Appetite n   Edema n    Cough n   Abdominal Pain n    Sputum n   Joint Pain     SOB/DOE n   Pruritis/Rash     Nausea/vomit n   Tolerating PT/OT     Diarrhea    Tolerating Diet     Constipation    Other       Could NOT obtain due to:      Objective:     VITALS:    Last 24hrs VS reviewed since prior progress note. Most recent are:  Patient Vitals for the past 24 hrs:   Temp Pulse Resp BP SpO2   07/20/17 1921 98.8 ??F (37.1 ??C) 81 18 106/63 98 %   07/20/17 1753 ??? 80 ??? 108/65 ???   07/20/17 1534 98.8 ??F (37.1 ??C) 75 18 92/53 98 %   07/20/17 1142 98.8 ??F (37.1 ??C) 69 18 103/57 97 %   07/20/17 0730 99.1 ??F (37.3 ??C) 82 18 107/71 98 %   07/19/17 2305 98.3 ??F (36.8 ??C) 83 18 148/89 91 %       Intake/Output Summary (Last 24 hours) at 07/20/2017 2213  Last data filed at 07/20/2017 0451  Gross per 24 hour   Intake ???   Output 300 ml   Net -300 ml        PHYSICAL EXAM:  General: WD, WN. Alert, confused, no acute distress????  EENT:  EOMI. Anicteric sclerae. MMM  Resp:  CTA bilaterally, no wheezing or rales.  No accessory muscle use  CV:  Regular  rhythm,?? No edema  GI:  Soft, Non  distended, Non tender. ??+Bowel sounds  Neurologic:?? Alert oriented to person/place/yr , normal speech, no focal findings  Psych:???? Limited insight. Not anxious nor agitated  Skin:  No rashes.  No jaundice    Reviewed most current lab test results and cultures  YES  Reviewed most current radiology test results   YES  Review and summation of old records today    NO  Reviewed patient's current orders and MAR    YES  PMH/SH reviewed - no change compared to H&P  ________________________________________________________________________  Care Plan discussed with:    Comments   Patient x    Family      RN x    Care Manager     Consultant                       x Multidiciplinary team rounds were held today with case manager, nursing, pharmacist and clinical coordinator.  Patient's plan of care was discussed; medications were reviewed and discharge planning was addressed.     ________________________________________________________________________  Total NON critical care TIME:  25   Minutes    Total CRITICAL CARE TIME Spent:   Minutes non procedure based      Comments    >50% of visit spent in counseling and coordination of care     ________________________________________________________________________  Shelda Jakeshristopher M Marykathleen Russi, DO     Procedures: see electronic medical records for all procedures/Xrays and details which were not copied into this note but were reviewed prior to creation of Plan.      LABS:  I reviewed today's most current labs and imaging studies.  Pertinent labs include:  No results for input(s): WBC, HGB, HCT, PLT, HGBEXT, HCTEXT, PLTEXT, HGBEXT, HCTEXT, PLTEXT in the last 72 hours.  No results for input(s): NA, K, CL, CO2, GLU, BUN, CREA, CA, MG, PHOS, ALB, TBIL, TBILI, SGOT, ALT, INR in the last 72 hours.    No lab exists for component: [place], INREXT    Signed: Stephanie Couphristopher M Jashiya Bassett, DO

## 2017-07-20 NOTE — Progress Notes (Signed)
Occupational Therapy:defer    Chart reviewed, patient up in chair visiting with sisters, educated on goals and role of OT, declined exercises or standing balance activities or ADLs, asking to rest, "no I"m fine right now" to all questions asked. Sisters stating she did participate well at rehab earlier last month.   Will follow up tomorrow,  Shanda BumpsJessica T. Hunsucker, MS OTR/L

## 2017-07-20 NOTE — Procedures (Signed)
[facility]   [address]   [place], [place] [id number]      Electroencephalogram         Procedure Date: 07/20/2017    Procedure ID: [id number]    Procedure Type: Routine    Patient Name: Meghan [name]     Date of Birth: 06/29/1946    Medical Record No: [id number]    INDICATION: Seizure, Altered mental status    Medications:    Current Facility-Administered Medications:   ???  lacosamide (VIMPAT) tablet 100 mg, 100 mg, Oral, Q12H, Meghan [name], Meghan [name], Meghan [name], 100 mg at 07/20/17 0907  ???  brivaracetam (BRIVIACT) tablet 50 mg, 50 mg, Oral, BID, Meghan Chestnutt, MD, 50 mg at 07/20/17 0908  ???  divalproex (DEPAKOTE SPRINKLE) capsule 500 mg, 500 mg, Oral, Q12H, Meghan [name], Meghan [name], Meghan [name], 500 mg at 07/20/17 0908  ???  aspirin chewable tablet 81 mg, 81 mg, Oral, DAILY, Meghan [name], Meghan [name], Meghan [name], 81 mg at 07/20/17 [id number]  ???  metoprolol tartrate (LOPRESSOR) tablet 25 mg, 25 mg, Oral, BID, Meghan [name], Meghan [name], Meghan [name], 25 mg at 07/20/17 [id number]  ???  insulin lispro (HUMALOG) injection, , SubCUTAneous, AC&HS, Meghan [name], Meghan CoupChristopher [name], Meghan [name], Stopped at 07/17/17 1130  ???  lisinopril (PRINIVIL, ZESTRIL) tablet 10 mg, 10 mg, Oral, DAILY, Meghan [name], Meghan [name], Meghan [name], 10 mg at 07/20/17 [id number]  ???  hydrALAZINE (APRESOLINE) 20 mg/mL injection 5 mg, 5 mg, IntraVENous, Q6H PRN, Meghan [name], Meghan DownerBertha E, MD  ???  sodium chloride (NS) flush 5-10 mL, 5-10 mL, IntraVENous, Q8H, Meghan [name], Meghan H, MD, 10 mL at 07/20/17 0600  ???  sodium chloride (NS) flush 5-10 mL, 5-10 mL, IntraVENous, PRN, Meghan [name], Meghan H, MD, 10 mL at 07/19/17 1226  ???  acetaminophen (TYLENOL) suppository 650 mg, 650 mg, Rectal, Q6H PRN, Meghan [name], Meghan H, MD, 650 mg at 07/14/17 1900  ???  ondansetron (ZOFRAN) injection 4 mg, 4 mg, IntraVENous, Q6H PRN, Meghan [name], Meghan H, MD  ???  bisacodyl (DULCOLAX) suppository 10 mg, 10 mg, Rectal, DAILY PRN, Meghan [name], Meghan H, MD  ???  enoxaparin (LOVENOX) injection 40 mg, 40 mg, SubCUTAneous, Q24H, Meghan [name], Meghan H, MD, 40 mg at  07/17/17 1220  ???  glucose chewable tablet 16 g, 4 Tab, Oral, PRN, Meghan [name], Meghan H, MD  ???  dextrose (D50W) injection syrg 12.5-25 g, 12.5-25 g, IntraVENous, PRN, Meghan [name], Meghan H, MD  ???  glucagon (GLUCAGEN) injection 1 mg, 1 mg, IntraMUSCular, PRN, Meghan [name], Meghan H, MD  ???  labetalol (NORMODYNE;TRANDATE) 20 mg/4 mL (5 mg/mL) injection 10 mg, 10 mg, IntraVENous, Q2H PRN, Meghan [name], Meghan H, MD, 10 mg at 07/15/17 0104  ???  LORazepam (ATIVAN) injection 1 mg, 1 mg, IntraVENous, PRN, Meghan Reid, MD    DESCRIPTION OF PROCEDURE: Electrodes were applied in accordance with the international 10-20 system of electrode placement.  EEG was reviewed in both bipolar and referential montages    DESCRIPTION OF FINDINGS: Background consists of symmetric 8hz  activity interrupted by predominantly but more frequent right frontal sharp waves which often spread to the contralateral frontal fields.  No seizures are again noted.   IMPRESSION:  Frequent frontal spikes. No seizures noted.      Absence of seizures on this study does not exclude epilepsy. Clinical correlation is advised.

## 2017-07-21 LAB — METABOLIC PANEL, BASIC
Anion gap: 5 mmol/L (ref 5–15)
BUN/Creatinine ratio: 20 (ref 12–20)
BUN: 18 MG/DL (ref 6–20)
CO2: 29 mmol/L (ref 21–32)
Calcium: 9 MG/DL (ref 8.5–10.1)
Chloride: 109 mmol/L — ABNORMAL HIGH (ref 97–108)
Creatinine: 0.91 MG/DL (ref 0.55–1.02)
GFR est AA: >60 mL/min/{1.73_m2} (ref >60)
GFR est non-AA: >60 mL/min/{1.73_m2} (ref >60)
Glucose: 80 mg/dL (ref 65–100)
Potassium: 4 mmol/L (ref 3.5–5.1)
Sodium: 143 mmol/L (ref 136–145)

## 2017-07-21 LAB — CBC WITH AUTOMATED DIFF
ABS. BASOPHILS: 0 10*3/uL (ref 0.0–0.1)
ABS. EOSINOPHILS: 0.2 10*3/uL (ref 0.0–0.4)
ABS. IMM. GRANS.: 0 10*3/uL (ref 0.00–0.04)
ABS. LYMPHOCYTES: 2.8 10*3/uL (ref 0.8–3.5)
ABS. MONOCYTES: 1.3 10*3/uL — ABNORMAL HIGH (ref 0.0–1.0)
ABS. NEUTROPHILS: 3.4 10*3/uL (ref 1.8–8.0)
ABSOLUTE NRBC: 0 10*3/uL (ref 0.00–0.01)
BASOPHILS: 1 % (ref 0–1)
EOSINOPHILS: 2 % (ref 0–7)
HCT: 37.4 % (ref 35.0–47.0)
HGB: 12.1 g/dL (ref 11.5–16.0)
IMMATURE GRANULOCYTES: 0 % (ref 0.0–0.5)
LYMPHOCYTES: 36 % (ref 12–49)
MCH: 31.1 PG (ref 26.0–34.0)
MCHC: 32.4 g/dL (ref 30.0–36.5)
MCV: 96.1 FL (ref 80.0–99.0)
MONOCYTES: 17 % — ABNORMAL HIGH (ref 5–13)
MPV: 10.4 FL (ref 8.9–12.9)
NEUTROPHILS: 44 % (ref 32–75)
NRBC: 0 PER 100 WBC
PLATELET: 385 10*3/uL (ref 150–400)
RBC: 3.89 M/uL (ref 3.80–5.20)
RDW: 14.5 % (ref 11.5–14.5)
WBC: 7.8 10*3/uL (ref 3.6–11.0)

## 2017-07-21 LAB — GLUCOSE, POC
Glucose (POC): 108 mg/dL — ABNORMAL HIGH (ref 65–100)
Glucose (POC): 81 mg/dL (ref 65–100)
Glucose (POC): 102 mg/dL — ABNORMAL HIGH (ref 65–100)
Glucose (POC): 83 mg/dL (ref 65–100)

## 2017-07-21 MED ORDER — LACOSAMIDE 100 MG TAB
100 mg | ORAL_TABLET | Freq: Two times a day (BID) | ORAL | 3 refills | Status: DC
Start: 2017-07-21 — End: 2017-07-31

## 2017-07-21 MED ORDER — TOPIRAMATE 25 MG TAB
25 mg | Freq: Two times a day (BID) | ORAL | Status: DC
Start: 2017-07-21 — End: 2017-07-21
  Administered 2017-07-21: 23:00:00 via ORAL

## 2017-07-21 MED ORDER — TOPIRAMATE 50 MG TAB
50 mg | ORAL_TABLET | Freq: Two times a day (BID) | ORAL | 3 refills | Status: DC
Start: 2017-07-21 — End: 2017-07-31

## 2017-07-21 MED FILL — BD POSIFLUSH NORMAL SALINE 0.9 % INJECTION SYRINGE: INTRAMUSCULAR | Qty: 10

## 2017-07-21 MED FILL — CHILDREN'S ASPIRIN 81 MG CHEWABLE TABLET: 81 mg | ORAL | Qty: 1

## 2017-07-21 MED FILL — METOPROLOL TARTRATE 25 MG TAB: 25 mg | ORAL | Qty: 1

## 2017-07-21 MED FILL — ENOXAPARIN 40 MG/0.4 ML SUB-Q SYRINGE: 40 mg/0.4 mL | SUBCUTANEOUS | Qty: 0.4

## 2017-07-21 MED FILL — TOPIRAMATE 25 MG TAB: 25 mg | ORAL | Qty: 1

## 2017-07-21 MED FILL — VIMPAT 50 MG TABLET: 50 mg | ORAL | Qty: 2

## 2017-07-21 MED FILL — DIVALPROEX 125 MG SPRINKLE CAP: 125 mg | ORAL | Qty: 4

## 2017-07-21 MED FILL — TOPIRAMATE 25 MG TAB: 25 mg | ORAL | Qty: 2

## 2017-07-21 MED FILL — LISINOPRIL 5 MG TAB: 5 mg | ORAL | Qty: 2

## 2017-07-21 NOTE — Progress Notes (Signed)
[facility]. This nurse was hung up on twice.

## 2017-07-21 NOTE — Progress Notes (Signed)
CM received call from MD that the patient is medically stable for d/c today. The [facility]. CM spoke with admissions coordinator and they are able to accept the patient for admission today. The phone number for report is [phone]. The patient's nurse is arranging for transport. CM will continue to follow the patient for dispo needs.     Hattie Figgers, LCSW

## 2017-07-21 NOTE — Discharge Summary (Addendum)
Hospitalist Discharge Summary     Patient ID:  Meghan [name]  [id number]  71 y.o.  03/03/1946    PCP on record: Gwynn Burly, NP    Admit date: 07/14/2017  Discharge date and time: 07/21/2017      DISCHARGE DIAGNOSIS:  Status epilepticus  Post-traumatic epilepsy  Unresponsive POA??  ?DM2   HTN  Hx psychiatric illness and TBI??    CONSULTATIONS:  IP CONSULT TO NEUROLOGY    Excerpted HPI from H&P of Meghan Perch, MD:  Meghan [name] is a 71 y.o.  African American female with seizure disorder brought in by EMS for seizure.  Per ER physician, daughter heard snoring respirations and suspect seizure and called EMS.  EMS noted patient having seizure.  Given versed intranasal, continued to have seizure en route and on arrival to ER and given ativan 1mg .  Patient has been unresponsive except to pain.  Unable to obtain history.  ??  Admitted 11/5 to 11/7 got acute encephalopathy due to suspected post-ictal state.  Underwent MRI and EEG and neurology consult.  Discharged home on valproic acid; NO CHANGE in anti-seizure meds mentioned in discharged summary but discharge medlist did not include Vimpat which patient was on based on prior discharge in August.     ______________________________________________________________________  DISCHARGE SUMMARY/HOSPITAL COURSE:  for full details see H&P, daily progress notes, labs, consult notes. Status epilepticus  Post-traumatic epilepsy  Multiple EEGs, init  Medication adjustment per neuro, will need outpatient follow-up with her neurologist, Dr Lennart Pall  Last EEG no 12/7: Frequent frontal spikes. No seizures noted  Prior EEG 12/6 with fronto temporal spike waves (meds adjusted)  Prior EEGs diffuse slowing  Cont depakote, vimpat, topamax  On keppra, dilantin, topamax in past  CT head negative.   Seen by SLP, regular diet, thin liquids  ??  Unresponsive POA??in the setting Seizure Disorder POA in 12F treated with Intranasal   Versed and IV Ativan by EMS/ED   UDS + benzo but was given versed by EMS. Ucx??negative.??  MS improved, talkative, oriented to person/place/year/month  Pt is oriented x 3 during interview, still some episodes where she becomes confused and tries to walk out room/states she's going home. Likely needs SNF vs 24 supervision  -attempted to reach Dtr/caretaker to discuss DC disposition  ??  ?DM2   HgA1c 5.2. On SSI, continue accuchecks. Normal BS so far in hospital  ??  HTN  BP still suboptimal control, start metoprolol and follow VS to adjust   Restart home lisinopril  On IV metoprolol. Speech therapy consulted.   Resume??home lisinopril if cleared for oral diet.  ??  Hx psychiatric illness and TBI??  ??  Per daughter previously able to dial phone, take care of own personal hygeine, cook, prepare meals, dress, iron her own shirts etc. Still far from baseline. Plan discharge to SNF      _______________________________________________________________________  Patient seen and examined by me on discharge day.  Pertinent Findings:  Gen:    Not in distress  Chest: Clear lungs  CVS:   Regular rhythm.  No edema  Abd:  Soft, not distended, not tender  Neuro:  Alert, oriented to person/place, not time  _______________________________________________________________________  DISCHARGE MEDICATIONS:   Current Discharge Medication List      START taking these medications    Details   topiramate (TOPAMAX) 50 mg tablet Take 1 Tab by mouth two (2) times daily (with meals).  Qty: 60 Tab, Refills: 3  lacosamide (VIMPAT) 100 mg tab tablet Take 1 Tab by mouth every twelve (12) hours. Max Daily Amount: 200 mg.  Qty: 60 Tab, Refills: 3    Associated Diagnoses: Status epilepticus (HCC)         CONTINUE these medications which have NOT CHANGED    Details   fluticasone (FLONASE) 50 mcg/actuation nasal spray 1 Spray by Both Nostrils route daily.      lisinopril (PRINIVIL, ZESTRIL) 10 mg tablet Take 1 Tab by mouth daily.  Qty: 30 Tab, Refills: 6     Associated Diagnoses: Essential hypertension      divalproex DR (DEPAKOTE) 500 mg tablet take 1 tablet by mouth twice a day  Qty: 60 Tab, Refills: 0    Associated Diagnoses: Seizure (HCC); Memory difficulty; Poor short term memory      gabapentin (NEURONTIN) 100 mg capsule take 1 capsule by mouth three times a day  Qty: 90 Cap, Refills: 0    Associated Diagnoses: Seizure (HCC); Memory difficulty; Poor short term memory; Neuropathy      methylphenidate HCl (RITALIN) 5 mg tablet Take 1 Tab (5 mg total) by mouth dailyEarliest Fill Date: 05/11/17.  Max Daily Amount: 5 mg  Qty: 30 Tab, Refills: 0    Associated Diagnoses: MCI (mild cognitive impairment); Post-traumatic brain syndrome; Mild cognitive impairment      fenofibrate (LOFIBRA) 54 mg tablet Take 1 Tab by mouth daily.  Qty: 30 Tab, Refills: 3    Associated Diagnoses: Hypercholesteremia      citalopram (CELEXA) 20 mg tablet Take 1 Tab by mouth daily.  Qty: 30 Tab, Refills: 3    Associated Diagnoses: Depression, unspecified depression type      cholecalciferol (VITAMIN D3) 1,000 unit cap take 1 capsule by mouth once daily  Qty: 30 Cap, Refills: 0      aspirin 81 mg chewable tablet Take 81 mg by mouth daily.      SUMAtriptan (IMITREX) 100 mg tablet Take 100 mg by mouth once as needed for Migraine. May repeat 1 tab in 2 hours, limit: 2 tabs in 24 hours, not more than 2 days a week.      naloxone (NARCAN) 4 mg/actuation nasal spray Use 1 spray intranasally into 1 nostril. Use a new Narcan nasal spray for subsequent doses and administer into alternating nostrils. May repeat every 2 to 3 minutes as needed.  Qty: 1 Each, Refills: 0         STOP taking these medications       aspirin delayed-release 81 mg tablet Comments:   Reason for Stopping:               My Recommended Diet, Activity, Wound Care, and follow-up labs are listed in the patient's Discharge Insturctions which I have personally completed and reviewed.     ______________________________________________________________________    Risk of deterioration: Moderate    Condition at Discharge:  Stable  ______________________________________________________________________    Disposition  SNF/LTC  ______________________________________________________________________    Care Plan discussed with:   Patient, Family, RN, Care Manager, Consultant    ______________________________________________________________________    Code Status: Full Code  ______________________________________________________________________      Follow up with:   PCP : Gwynn BurlySeabrook, Berna A, NP  Follow-up Information     Follow up With Specialties Details Why Contact Info    [facility]   [address]  [place] [id number]  [phone]      Gwynn BurlySeabrook, Berna A, NP  Nurse Practitioner Go on 08/10/2017 at 10:15 as previously scheduled.   [address]  [place] [id number]  [phone]      Threasa HeadsHegab, Ibrahim, MD Neurology  Office will call to schedule hospital follow up appointment [address]  [address]  [place] [id number]  [phone]                Total time in minutes spent coordinating this discharge (includes going over instructions, follow-up, prescriptions, and preparing report for sign off to her PCP) :  Greater than 30 minutes    Signed:  Shelda Jakeshristopher M Jestin Burbach, DO
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
## 2017-07-21 NOTE — Progress Notes (Signed)
TRANSFER - OUT REPORT:    Verbal report given to Shaunice, RN(name) on Meghan [name]  [facility] for [facility] of care       Report consisted of patient???s Situation, Background, Assessment and   Recommendations(SBAR).     Information from the following report(s) SBAR, Intake/Output, MAR, [place]ordion and Recent Results was reviewed with the receiving nurse.    Lines:   Peripheral IV 07/17/17 Anterior;Inferior;Lower;Right Arm (Active)   Site Assessment Clean, dry, & intact 07/21/2017  3:57 PM   Phlebitis Assessment 0 07/21/2017  3:57 PM   Infiltration Assessment 0 07/21/2017  3:57 PM   Dressing Status Clean, dry, & intact 07/21/2017  3:57 PM   Dressing Type Tape;Transparent 07/21/2017  3:57 PM   Hub Color/Line Status Blue 07/21/2017  3:57 PM        Opportunity for questions and clarification was provided.
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
## 2017-07-21 NOTE — Discharge Summary (Signed)
Discharge Summary by Shelda Jakes, DO at 07/21/17 1548                Author: Shelda Jakes, DO  Service: Internal Medicine  Author Type: Physician       Filed: 07/21/17 1621  Date of Service: 07/21/17 1548  Status: Addendum          Editor: Shelda Jakes, DO (Physician)          Related Notes: Original Note by Shelda Jakes, DO (Physician) filed at 07/21/17 1615                                       Hospitalist Discharge Summary        Patient ID:   Meghan [name]   [id number]   71 y.o.   1945-12-04      PCP on record: Gwynn Burly, NP      Admit date: 07/14/2017   Discharge date and time: 07/21/2017         DISCHARGE DIAGNOSIS:   Status epilepticus   Post-traumatic epilepsy   Unresponsive POA??   ?DM2    HTN   Hx psychiatric illness and TBI??      CONSULTATIONS:   IP CONSULT TO NEUROLOGY      Excerpted HPI from H&P of Daune Perch, MD:   Meghan [name] is a 71 y.o.  African American female with seizure disorder brought in by EMS for seizure.  Per ER physician, daughter heard snoring respirations  and suspect seizure and called EMS.  EMS noted patient having seizure.  Given versed intranasal, continued to have seizure en route and on arrival to ER and given ativan 1mg .  Patient has been unresponsive except to pain.  Unable to obtain history.   ??   Admitted 11/5 to 11/7 got acute encephalopathy due to suspected post-ictal state.  Underwent MRI and EEG and neurology consult.  Discharged home on valproic acid; NO CHANGE in anti-seizure meds mentioned in discharged summary but discharge medlist did  not include Vimpat which patient was on based on prior discharge in August.       ______________________________________________________________________   DISCHARGE SUMMARY/HOSPITAL COURSE:   for full details see H&P, daily progress notes, labs, consult notes.    Status epilepticus   Post-traumatic epilepsy   Multiple EEGs, init   Medication adjustment per neuro,  will need outpatient follow-up with her neurologist, Dr Lennart Pall   Last EEG no 12/7: Frequent frontal spikes. No seizures noted   Prior EEG 12/6 with fronto temporal spike waves (meds adjusted)   Prior EEGs diffuse slowing   Cont depakote, vimpat, topamax   On keppra, dilantin, topamax in past   CT head negative.    Seen by SLP, regular diet, thin liquids   ??   Unresponsive POA??in the setting Seizure Disorder POA in 29F treated with Intranasal    Versed and IV Ativan by EMS/ED   UDS + benzo but was given versed by EMS. Ucx??negative.??   MS improved, talkative, oriented to person/place/year/month   Pt is oriented x 3 during interview, still some episodes where she becomes confused and tries to walk out room/states she's going home. Likely needs SNF vs 24 supervision   -attempted to reach Dtr/caretaker to discuss DC disposition   ??   ?DM2    HgA1c 5.2. On SSI, continue accuchecks. Normal BS so far  in hospital   ??   HTN   BP still suboptimal control, start metoprolol and follow VS to adjust    Restart home lisinopril   On IV metoprolol. Speech therapy consulted.    Resume??home lisinopril if cleared for oral diet.   ??   Hx psychiatric illness and TBI??   ??   Per daughter previously able to dial phone, take care of own personal hygeine, cook, prepare meals, dress, iron her own shirts etc. Still far from baseline. Plan discharge to SNF         _______________________________________________________________________   Patient seen and examined by me on discharge day.   Pertinent Findings:   Gen:    Not in distress   Chest: Clear lungs   CVS:   Regular rhythm.  No edema   Abd:  Soft, not distended, not tender   Neuro:  Alert, oriented to person/place, not time   _______________________________________________________________________   DISCHARGE MEDICATIONS:      Current Discharge Medication List              START taking these medications          Details        topiramate (TOPAMAX) 50 mg tablet  Take 1 Tab by mouth two (2) times  daily (with meals).   Qty: 60 Tab, Refills:  3               lacosamide (VIMPAT) 100 mg tab tablet  Take 1 Tab by mouth every twelve (12) hours. Max Daily Amount: 200 mg.   Qty: 60 Tab, Refills:  3          Associated Diagnoses: Status epilepticus (HCC)                     CONTINUE these medications which have NOT CHANGED          Details        fluticasone (FLONASE) 50 mcg/actuation nasal spray  1 Spray by Both Nostrils route daily.               lisinopril (PRINIVIL, ZESTRIL) 10 mg tablet  Take 1 Tab by mouth daily.   Qty: 30 Tab, Refills:  6          Associated Diagnoses: Essential hypertension               divalproex DR (DEPAKOTE) 500 mg tablet  take 1 tablet by mouth twice a day   Qty: 60 Tab, Refills:  0          Associated Diagnoses: Seizure (HCC); Memory difficulty; Poor short term memory               gabapentin (NEURONTIN) 100 mg capsule  take 1 capsule by mouth three times a day   Qty: 90 Cap, Refills:  0          Associated Diagnoses: Seizure (HCC); Memory difficulty; Poor short term memory; Neuropathy               methylphenidate HCl (RITALIN) 5 mg tablet  Take 1 Tab (5 mg total) by mouth dailyEarliest Fill Date: 05/11/17.  Max Daily Amount: 5 mg   Qty: 30 Tab, Refills:  0          Associated Diagnoses: MCI (mild cognitive impairment); Post-traumatic brain syndrome; Mild cognitive  impairment               fenofibrate (LOFIBRA) 54 mg tablet  Take 1  Tab by mouth daily.   Qty: 30 Tab, Refills:  3          Associated Diagnoses: Hypercholesteremia               citalopram (CELEXA) 20 mg tablet  Take 1 Tab by mouth daily.   Qty: 30 Tab, Refills:  3          Associated Diagnoses: Depression, unspecified depression type               cholecalciferol (VITAMIN D3) 1,000 unit cap  take 1 capsule by mouth once daily   Qty: 30 Cap, Refills:  0               aspirin 81 mg chewable tablet  Take 81 mg by mouth daily.               SUMAtriptan (IMITREX) 100 mg tablet  Take 100 mg by mouth once as needed for Migraine.  May repeat 1 tab in 2 hours, limit: 2 tabs in 24 hours, not more than 2 days a week.               naloxone (NARCAN) 4 mg/actuation nasal spray  Use 1 spray intranasally into 1 nostril. Use a new Narcan nasal spray for subsequent doses and administer into alternating nostrils. May repeat every 2  to 3 minutes as needed.   Qty: 1 Each, Refills:  0                     STOP taking these medications                  aspirin delayed-release 81 mg tablet  Comments:    Reason for Stopping:                             My Recommended Diet, Activity, Wound Care, and follow-up labs are listed in the patient's Discharge Insturctions which I have personally completed and reviewed.      ______________________________________________________________________      Risk of deterioration:  Moderate      Condition at Discharge:  Stable   ______________________________________________________________________      Disposition   SNF/LTC   ______________________________________________________________________      Care Plan discussed with:    Patient, Family, RN, Care Manager, Consultant      ______________________________________________________________________      Code Status: Full Code   ______________________________________________________________________         Follow up with:    PCP : Gwynn Burly, NP     Follow-up Information               Follow up With  Specialties  Details  Why  Contact Info              [facility])  Skilled Nursing Facility      [address]   [place] [address]   [phone]                 Gwynn Burly, NP  Nurse Practitioner  Go on 08/10/2017  at 10:15 as previously scheduled.    [address]   [place] [address]   [phone]                 Threasa Heads, MD  Neurology    Office will call to schedule hospital follow up appointment  [id number]   [address]   [place] [id number]   [phone]                         Total time in  minutes spent coordinating this discharge (includes going over instructions, follow-up, prescriptions, and preparing report for sign off to her PCP) :  Greater than 30 minutes      Signed:   Stephanie Couphristopher M Keymarion Bearman, DO

## 2017-07-24 NOTE — Progress Notes (Signed)
Transition of Care Coordination/Hospital to Post Acute Facility:     Date/Time:  07/24/2017 5:03 PM    [facility] and discharged on 12/8 for Status Epilepticus.  Patient was [facility] for continuation of care.    Inpatient RRAT score: 23    Top Challenges reviewed    None identified at this time         Method of communication with care team :none     Nurse Navigator(NN) spoke with Latrice to provide introduction to self and explanation of the Nurse Navigator Role. Verified name and DOB as patient identifiers.     Discussed and reviewed  Seizures, medication reconciliation - to be faxed to this writer as unit has multiple admissions at this time and unable to complete.   12/12 med list received and reviewed- list is accurate.      ACP:   Does the patient have a current ACP (including DDNR):  no  Does the post acute facility have a copy of the patients ACP:  [name]    Medication(s):   New Medications at Discharge:   Topamax 50 mg BID  Vimpat 100 mg q12 hr  Changed Medications at Discharge: [name]  Discontinued Medications at Discharge:   ASA 81 mg     PCP/Specialist follow up:   Future Appointments   Date Time Provider Department Center   07/31/2017 10:00 AM Fletcher Anonavis, [facility] J, NP [facility]   08/10/2017 10:15 AM Gwynn BurlySeabrook, Berna A, NP [facility]   09/12/2017 10:30 AM Seabrook, Annie SableBerna A, [facility]        Opportunity to ask questions was provided. Contact information was provided for future reference or further questions. Will continue to monitor.

## 2017-07-31 ENCOUNTER — Ambulatory Visit
Admit: 2017-07-31 | Discharge: 2017-07-31 | Payer: PRIVATE HEALTH INSURANCE | Attending: Nurse Practitioner | Primary: Internal Medicine

## 2017-07-31 DIAGNOSIS — G40901 Epilepsy, unspecified, not intractable, with status epilepticus: Secondary | ICD-10-CM

## 2017-07-31 MED ORDER — DIVALPROEX 250 MG TAB, DELAYED RELEASE
250 mg | ORAL_TABLET | ORAL | 0 refills | Status: DC
Start: 2017-07-31 — End: 2017-09-06

## 2017-07-31 MED ORDER — LACOSAMIDE 150 MG TAB
150 mg | ORAL_TABLET | Freq: Two times a day (BID) | ORAL | 0 refills | Status: DC
Start: 2017-07-31 — End: 2017-09-10
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
## 2017-08-03 NOTE — Progress Notes (Signed)
This Clinical research associatewriter reaches out to [facility] for update on pt's progress. This Clinical research associatewriter speaks with nurse Robin who advises pt is more verbal/alert. Active. She is not aware of any additional seizures. Declines a change in medications. Report blood glucose has ranged from 75-186. This Clinical research associatewriter is then transferred to [facility] who has taken writers contact information for [facility]. At this time the pt is est to dc 4 weeks from admission (08/21/17). This Clinical research associatewriter will follow pt's progress.

## 2017-08-10 ENCOUNTER — Encounter: Attending: Nurse Practitioner | Primary: Internal Medicine

## 2017-08-15 ENCOUNTER — Ambulatory Visit
Admit: 2017-08-15 | Discharge: 2017-08-15 | Payer: PRIVATE HEALTH INSURANCE | Attending: Neurology | Primary: Internal Medicine

## 2017-08-15 DIAGNOSIS — G40209 Localization-related (focal) (partial) symptomatic epilepsy and epileptic syndromes with complex partial seizures, not intractable, without status epilepticus: Secondary | ICD-10-CM
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
## 2017-08-15 NOTE — Progress Notes (Signed)
Chief Complaint   Patient presents with   ??? Seizure     1. Have you been to the ER, urgent care clinic since your last visit?  Hospitalized since your last visit? [facility] 07/14/17    2. Have you seen or consulted any other health care providers outside of the [facility] since your last visit?  Include any pap smears or colon screening. no

## 2017-08-16 NOTE — Progress Notes (Signed)
This Clinical research associatewriter reaches out to [facility] for update on pt's progress. This Clinical research associatewriter learns from Ms. Dois DavenportSandra there have been no seizures since admission. Pt's sisters visit daily. Estimated date of discharge is 08/19/17. Social Workers/DC Planners in a meeting this morning thus unable to speak with them and nurses aren't picking up to speak with this Clinical research associatewriter.

## 2017-08-19 MED ORDER — DONEPEZIL 10 MG TAB
10 mg | ORAL_TABLET | Freq: Every evening | ORAL | 1 refills | Status: DC
Start: 2017-08-19 — End: 2017-11-02

## 2017-08-20 ENCOUNTER — Encounter: Admit: 2017-08-20 | Discharge: 2017-08-20 | Payer: MEDICARE | Primary: Internal Medicine

## 2017-08-20 ENCOUNTER — Encounter: Primary: Internal Medicine

## 2017-08-21 ENCOUNTER — Encounter: Admit: 2017-08-21 | Discharge: 2017-08-21 | Payer: MEDICARE | Primary: Internal Medicine

## 2017-08-22 ENCOUNTER — Encounter: Admit: 2017-08-22 | Discharge: 2017-08-22 | Payer: MEDICARE | Primary: Internal Medicine

## 2017-08-22 ENCOUNTER — Encounter: Primary: Internal Medicine

## 2017-08-22 NOTE — Progress Notes (Signed)
Hospital Discharge Follow-Up      Date/Time:  08/22/2017 3:20 PM    Patient was admitted to [facility] on 12/1 and discharged on 12/8 for Epilepticus. The physician discharge summary was available at the time of outreach.  Patient was contacted within 3 business days of discharge.      Top Challenges reviewed with the provider   Medication- should pt be taking Lisinopril and Topamax? These meds were on her hosp dc but not her snf dc summary and summary doesn't indicate why not to take them.          Method of communication with provider :chart routing    Inpatient RRAT score: 23  Was this a readmission? yes   Patient stated reason for the readmission:seizure    Nurse Navigator (NN) contacted the family by telephone to perform post hospital discharge assessment. Verified name and DOB with family as identifiers. Provided introduction to self, and explanation of the Nurse Navigator role.     Reviewed discharge instructions and red flags with family who verbalized understanding. Family given an opportunity to ask questions and does not have any further questions or concerns at this time. The family agrees to contact the PCP office for questions related to their healthcare. NN provided contact information for future reference.    Disease Specific:   Diabetes    Summary of patient's top problems:  1. Seizure- Pt was prescribed Topamax at hosp dc but not indicated as a current med via snf dc sum  2. Hyper/Hypoglycemia- home monitoring not occuring  3.     Home Health orders at discharge: PT, SN  Home Health company: [employer]  Date of initial visit: 08/20/17    Durable Medical Equipment ordered/company: n/a  Durable Medical Equipment received: n/a    Barriers to care? None identified at this time.     Advance Care Planning:   Does patient have an Advance Directive:  Per sister pt doesn't have one and is not capable of making such decisions at this time.      Medication(s):    New Medications at Discharge: Augmentin  Changed Medications at Discharge: n/a  Discontinued Medications at Discharge: unknown- Topamax and Lisinopril are not on snf current med list    Medication reconciliation was performed with family, who verbalizes understanding of administration of home medications.  There were no barriers to obtaining medications identified at this time.    Referral to Pharm D needed: no     Current Outpatient Medications   Medication Sig   ??? donepezil (ARICEPT) 10 mg tablet Take 1 Tab by mouth nightly.   ??? amoxicillin-clavulanate (AUGMENTIN) 875-125 mg per tablet Take  by mouth every twelve (12) hours.   ??? lacosamide (VIMPAT) 150 mg tab tablet Take 1 Tab by mouth two (2) times a day. Max Daily Amount: 300 mg.   ??? divalproex DR (DEPAKOTE) 250 mg tablet take 1 tablet by mouth QHS   ??? lactulose (ENULOSE) 10 gram/15 mL solution Take  by mouth three (3) times daily.   ??? SUMAtriptan (IMITREX) 100 mg tablet Take 100 mg by mouth once as needed for Migraine. May repeat 1 tab in 2 hours, limit: 2 tabs in 24 hours, not more than 2 days a week.   ??? gabapentin (NEURONTIN) 100 mg capsule take 1 capsule by mouth three times a day   ??? methylphenidate HCl (RITALIN) 5 mg tablet Take 1 Tab (5 mg total) by mouth dailyEarliest Fill Date: 05/11/17.  Max Daily Amount: 5 mg   ??? fenofibrate (LOFIBRA) 54 mg tablet Take 1 Tab by mouth daily.   ??? citalopram (CELEXA) 20 mg tablet Take 1 Tab by mouth daily.   ??? cholecalciferol (VITAMIN D3) 1,000 unit cap take 1 capsule by mouth once daily   ??? aspirin 81 mg chewable tablet Take 81 mg by mouth daily.   ??? topiramate (TOPAMAX) 50 mg tablet Take  by mouth two (2) times a day.   ??? fluticasone (FLONASE) 50 mcg/actuation nasal spray 1 Spray by Both Nostrils route daily.   ??? lisinopril (PRINIVIL, ZESTRIL) 10 mg tablet Take 1 Tab by mouth daily.   ??? naloxone (NARCAN) 4 mg/actuation nasal spray Use 1 spray intranasally  into 1 nostril. Use a new Narcan nasal spray for subsequent doses and administer into alternating nostrils. May repeat every 2 to 3 minutes as needed.     No current facility-administered medications for this visit.        There are no discontinued medications.    [facility] follow up appointment(s):   Future Appointments   Date Time Provider Department Center   08/23/2017 To Be Determined Ilona SorrelO'Donnell, [facility]   08/23/2017 12:00 PM Renaldo FiddlerSparks, Amanda [facility]   08/27/2017 To Be Determined Ilona SorrelO'Donnell, Amanda [facility]   08/27/2017  9:15 AM Gwynn BurlySeabrook, Berna A, NP [facility]   08/28/2017 To Be Determined Gaye PollackJones, Crystal T, LPN [facility]   08/30/2017 To Be Determined Gaye PollackJones, Crystal T, LPN [facility]   08/30/2017 To Be Determined Ilona SorrelO'Donnell, Amanda [facility]   09/03/2017 To Be Determined Ilona SorrelO'Donnell, Amanda [facility]   09/04/2017 To Be Determined Gaye PollackJones, Crystal T, LPN [facility]   09/06/2017 To Be Determined Gaye PollackJones, Crystal T, LPN [facility]   09/06/2017 To Be Determined Sharyne RichtersJoyner, Amy, PT [facility]   09/10/2017 To Be Determined Melodie BouillonFeldman, Laura, RN [facility]   09/12/2017 10:30 AM Gwynn BurlySeabrook, Berna A, NP [facility]   09/17/2017 To Be Determined Gaye PollackJones, Crystal T, LPN [facility]   09/24/2017 To Be Determined Gaye PollackJones, Crystal T, LPN [facility]   10/01/2017 To Be Determined Gaye PollackJones, Crystal T, LPN [facility]   10/08/2017 To Be Determined Gaye PollackJones, Crystal T, LPN [facility]   10/12/2017  2:40 PM Aralu, Shelda Altesletus C, MD [facility] SCHED   10/15/2017 To Be Determined Melodie BouillonFeldman, Laura, RN [facility] [place] [facility]      Non-[facility] follow up appointment(s): n/a  [facility]:  information provided as a resource       Goals     None

## 2017-08-23 ENCOUNTER — Encounter: Admit: 2017-08-23 | Discharge: 2017-08-23 | Payer: MEDICARE | Primary: Internal Medicine

## 2017-08-27 ENCOUNTER — Ambulatory Visit
Admit: 2017-08-27 | Discharge: 2017-08-27 | Payer: PRIVATE HEALTH INSURANCE | Attending: Nurse Practitioner | Primary: Internal Medicine

## 2017-08-27 ENCOUNTER — Encounter: Primary: Internal Medicine

## 2017-08-27 DIAGNOSIS — J309 Allergic rhinitis, unspecified: Secondary | ICD-10-CM

## 2017-08-27 MED ORDER — FLUTICASONE 50 MCG/ACTUATION NASAL SPRAY, SUSP
50 mcg/actuation | Freq: Every day | NASAL | 1 refills | Status: DC
Start: 2017-08-27 — End: 2017-09-10

## 2017-08-27 NOTE — Patient Instructions (Addendum)
Allergies: Care Instructions  Your Care Instructions    Allergies occur when your body's defense system (immune system) overreacts to certain substances. The immune system treats a harmless substance as if it were a harmful germ or virus. Many things can cause this overreaction, including pollens, medicine, food, dust, animal dander, and mold.  Allergies can be mild or severe. Mild allergies can be managed with home treatment. But medicine may be needed to prevent problems.  Managing your allergies is an important part of staying healthy. Your doctor may suggest that you have allergy testing to help find out what is causing your allergies. When you know what things trigger your symptoms, you can avoid them. This can prevent allergy symptoms and other health problems.  For severe allergies that cause reactions that affect your whole body (anaphylactic reactions), your doctor may prescribe a shot of epinephrine to carry with you in case you have a severe reaction. Learn how to give yourself the shot and keep it with you at all times. Make sure it is not expired.  Follow-up care is a key part of your treatment and safety. Be sure to make and go to all appointments, and call your doctor if you are having problems. It's also a good idea to know your test results and keep a list of the medicines you take.  How can you care for yourself at home?  ?? If you have been told by your doctor that dust or dust mites are causing your allergy, decrease the dust around your bed:  ? Wash sheets, pillowcases, and other bedding in hot water every week.  ? Use dust-proof covers for pillows, duvets, and mattresses. Avoid plastic covers because they tear easily and do not "breathe." Wash as instructed on the label.  ? Do not use any blankets and pillows that you do not need.  ? Use blankets that you can wash in your washing machine.  ? Consider removing drapes and carpets, which attract and hold dust, from your bedroom.   ?? If you are allergic to house dust and mites, do not use home humidifiers. Your doctor can suggest ways you can control dust and mites.  ?? Look for signs of cockroaches. Cockroaches cause allergic reactions. Use cockroach baits to get rid of them. Then, clean your home well. Cockroaches like areas where grocery bags, newspapers, empty bottles, or cardboard boxes are stored. Do not keep these inside your home, and keep trash and food containers sealed. Seal off any spots where cockroaches might enter your home.  ?? If you are allergic to mold, get rid of furniture, rugs, and drapes that smell musty. Check for mold in the bathroom.  ?? If you are allergic to outdoor pollen or mold spores, use air-conditioning. Change or clean all filters every month. Keep windows closed.  ?? If you are allergic to pollen, stay inside when pollen counts are high. Use a vacuum cleaner with a HEPA filter or a double-thickness filter at least two times each week.  ?? Stay inside when air pollution is bad. Avoid paint fumes, perfumes, and other strong odors.  ?? Avoid conditions that make your allergies worse. Stay away from smoke. Do not smoke or let anyone else smoke in your house. Do not use fireplaces or wood-burning stoves.  ?? If you are allergic to your pets, change the air filter in your furnace every month. Use high-efficiency filters.  ?? If you are allergic to pet dander, keep pets outside or out of   your bedroom. Old carpet and cloth furniture can hold a lot of animal dander. You may need to replace them.  When should you call for help?  Give an epinephrine shot if:  ?? ?? You think you are having a severe allergic reaction.   ?? ?? You have symptoms in more than one body area, such as mild nausea and an itchy mouth.   ??After giving an epinephrine shot call 911, even if you feel better.  ??Call 911 if:  ?? ?? You have symptoms of a severe allergic reaction. These may include:  ? Sudden raised, red areas (hives) all over your body.   ? Swelling of the throat, mouth, lips, or tongue.  ? Trouble breathing.  ? Passing out (losing consciousness). Or you may feel very lightheaded or suddenly feel weak, confused, or restless.   ?? ?? You have been given an epinephrine shot, even if you feel better.   ??Call your doctor now or seek immediate medical care if:  ?? ?? You have symptoms of an allergic reaction, such as:  ? A rash or hives (raised, red areas on the skin).  ? Itching.  ? Swelling.  ? Belly pain, nausea, or vomiting.   ??Watch closely for changes in your health, and be sure to contact your doctor if:  ?? ?? You do not get better as expected.   Where can you learn more?  Go to [employer].  Enter [phone] in the search box to learn more about "Allergies: Care Instructions."  Current as of: February 08, 2017  Content Version: 11.8  ?? 2006-2018 [employer]. Care instructions adapted under license by [employer] (which disclaims liability or warranty for this information). If you have questions about a medical condition or this instruction, always ask your healthcare professional. [employer] disclaims any warranty or liability for your use of this information.         Alzheimer's Disease: Care Instructions  Your Care Instructions    Alzheimer's disease is a type of dementia. It causes memory loss and affects judgment, language, and behavior. You may have trouble making decisions or may get lost in places that you used to know well. Alzheimer's disease is different than mild memory loss that occurs with aging. It is not clear what causes Alzheimer's disease, but it is the most common form of dementia in older adults.  Finding out that you have this disease is a shock. You may be afraid and worried about how the condition will change your life. Although there is no cure at this time, medicine in some cases may slow memory loss for a  while. Other medicines may be able to help you sleep or cope with depression and behavior changes.  Alzheimer's disease is different for everyone. It may take many years to develop. In some cases, people can function well for a long time. In the early stage of the disease, you can do things at home to make life easier and safer. You also can keep doing your hobbies and other activities. Many people find comfort in planning now for their future needs.  Follow-up care is a key part of your treatment and safety. Be sure to make and go to all appointments, and call your doctor if you are having problems. It's also a good idea to know your test results and keep a list of the medicines you take.  How can you care for yourself at home?  Taking care of yourself  ?? If  your doctor gives you medicines, take them exactly as prescribed. Call your doctor if you think you are having a problem with your medicine. You will get more details on the medicines your doctor prescribes.  ?? Eat a balanced diet. Get plenty of whole grains, fruits, and vegetables every day. If you are not hungry at mealtimes, eat snacks at midmorning and in the afternoon. Try drinks such as Boost, Ensure, or Sustacal if you are having trouble keeping your weight up.  ?? Stay active. Exercise such as walking may slow the decline of your mental abilities. Try to stay active mentally too. Read and work crossword puzzles if you enjoy these activities.  ?? If you have trouble sleeping, do not nap during the day. Get regular exercise (but not within several hours of bedtime). Drink a glass of warm milk or caffeine-free herbal tea before going to bed.  ?? Ask your doctor about support groups and other resources in your area. They can help people who have Alzheimer's disease and their families.  ?? Be patient. You may find that a task takes you longer than it used to.  ?? If you have not already done so, make a list of advance directives.  Advance directives are instructions to your doctor and family members about what kind of care you want if you become unable to speak or express yourself. Talk to a lawyer about making a will, if you do not already have one.  Keeping schedules  ?? Develop a routine. You will feel less frustrated or confused if you have a clear, simple plan of what to do every day.  ? Make lists of your medicines and when to take them.  ? Write down appointments and other tasks in a calendar.  ? Put sticky notes around the house to help you remember events and other things you have to do.  ? Schedule activities and tasks for times of the day when you are best able to handle them.  Staying safe  ?? Tell someone when you are going out and where you are going. Let the person know when you will be back. Before you go out alone, write down where you are going, how to get there, and how to get back home. Do this even if you have gone there many times before. Take someone along with you when possible.  ?? Make your home safe. Tack down rugs, put no-slip tape in the tub, use handrails, and put safety switches on stoves and appliances.  ?? Have a family member or other caregiver tell you whether you are driving badly. Deciding to stop driving is very hard for many people. Driving helps you feel independent. Your state driver's license bureau can do a driving test if there is any question. Plan for other means of getting around when you are no longer able to drive.  ?? Use strong lighting, especially at night. Put night-lights in bedrooms, hallways, and bathrooms.  ?? Lower the hot water temperature setting to 120??F or lower to avoid burns.  When should you call for help?  Call 911 anytime you think you may need emergency care. For example, call if:  ?? ?? You are lost and do not know whom to call.   ?? ?? You are injured and do not know whom to call.   ??Call your doctor now or seek immediate medical care if:  ?? ?? Your symptoms suddenly get much worse.    ??Watch closely for changes in  your health, and be sure to contact your doctor if:  ?? ?? You want more information about how you can take care of yourself.   Where can you learn more?  Go to [email or web address]www.[email or web address].net/[employer].  Enter Y179 in the search box to learn more about "Alzheimer's Disease: Care Instructions."  Current as of: July 20, 2016  Content Version: 11.8  ?? 2006-2018 [employer]. Care instructions adapted under license by [employer] (which disclaims liability or warranty for this information). If you have questions about a medical condition or this instruction, always ask your healthcare professional. [employer] disclaims any warranty or liability for your use of this information.         Learning About Epilepsy  What is epilepsy?    Epilepsy is a common condition that causes repeated seizures. Seizures may cause problems with muscle control, movement, speech, vision, or awareness. They usually don't last very long, but they can be scary. Treatment usually works to control and reduce seizures.  What causes it?  Often doctors don't know what causes epilepsy. It's possible that it may run in families. But you don't have to have a family history to develop it.  What are the symptoms?  The main symptom of epilepsy is repeated seizures that happen without warning. There are different kinds of seizures. You may notice strange smells or sounds. You may lose control of your muscles. Or your body may twitch or jerk. Your symptoms will depend on the type of seizure you have.  How is it diagnosed?  Diagnosing epilepsy can be hard. Your doctor will ask questions to find out what happened just before, during, and right after a seizure. Your doctor will examine you. You'll have some tests, such as an electroencephalogram. This information can help your doctor decide what kind of seizures you have and if you have epilepsy.  How is it treated?   You can take medicines to control and reduce seizures. Which type you use depends on the type of seizure. You and your doctor will need to find the right combination, schedule, and dose of medicine. If medicine alone doesn't help, your doctor may suggest a special diet or surgery to help reduce seizures.  How can you care for yourself at home?  To control your seizures, you need to follow your treatment plan. If you take medicine to control seizures, you must take it exactly as prescribed.  The medicine works only if you take the right amount on the schedule your doctor sets up. Following this schedule keeps the right level of medicine in your body. Even missing just a few doses can allow seizures to happen.  You might be on a special ketogenic diet. If so, you'll need to follow the diet exactly for it to help prevent seizures.  As you follow your treatment plan, also try to figure out and avoid things that may make you more likely to have a seizure. These may include:  ?? Not getting enough sleep.  ?? Using drugs or alcohol.  ?? Being stressed.  ?? Skipping meals.  If you keep having seizures despite treatment, keep a record of them. Note the date, time of day, and any details about the seizure that you can remember. Your doctor can use this information to plan or adjust your medicine or other treatment. The record can also help your doctor find out what kinds of seizures you are having.  If you have epilepsy:  ?? Be sure that any doctor who  treats you knows that you have epilepsy. And let the doctor know what medicines you take, if any.  ?? Wear a medical ID bracelet. If you have a seizure or accident that leaves you unconscious or unable to speak for yourself, the bracelet will let those who are treating you know that you have epilepsy. It will also list any medicines you take to control your seizures. That way, you won't be given any medicines that will react badly with those already in your body.   ?? Create a seizure first-aid plan with your friends and family. The plan will help them know how to help you. The kind of plan you need can depend on the kind of seizures you have. Your doctor can tell you more about this.  Follow-up care is a key part of your treatment and safety. Be sure to make and go to all appointments, and call your doctor if you are having problems. It's also a good idea to know your test results and keep a list of the medicines you take.  Where can you learn more?  Go to [employer].  Enter E175 in the search box to learn more about "Learning About Epilepsy."  Current as of: January 15, 2017  Content Version: 11.8  ?? 2006-2018 [employer]. Care instructions adapted under license by [employer] (which disclaims liability or warranty for this information). If you have questions about a medical condition or this instruction, always ask your healthcare professional. [employer] disclaims any warranty or liability for your use of this information.         High Blood Pressure: Care Instructions  Your Care Instructions    If your blood pressure is usually above 130/80, you have high blood pressure, or hypertension. That means the top number is 130 or higher or the bottom number is 80 or higher, or both.  Despite what a lot of people think, high blood pressure usually doesn't cause headaches or make you feel dizzy or lightheaded. It usually has no symptoms. But it does increase your risk for heart attack, stroke, and kidney or eye damage. The higher your blood pressure, the more your risk increases.  Your doctor will give you a goal for your blood pressure. Your goal will be based on your health and your age.  Lifestyle changes, such as eating healthy and being active, are always important to help lower blood pressure. You might also take medicine to reach your blood pressure goal.   Follow-up care is a key part of your treatment and safety. Be sure to make and go to all appointments, and call your doctor if you are having problems. It's also a good idea to know your test results and keep a list of the medicines you take.  How can you care for yourself at home?  Medical treatment  ?? If you stop taking your medicine, your blood pressure will go back up. You may take one or more types of medicine to lower your blood pressure. Be safe with medicines. Take your medicine exactly as prescribed. Call your doctor if you think you are having a problem with your medicine.  ?? Talk to your doctor before you start taking aspirin every day. Aspirin can help certain people lower their risk of a heart attack or stroke. But taking aspirin isn't right for everyone, because it can cause serious bleeding.  ?? See your doctor regularly. You may need to see the doctor more often at first or until your  blood pressure comes down.  ?? If you are taking blood pressure medicine, talk to your doctor before you take decongestants or anti-inflammatory medicine, such as ibuprofen. Some of these medicines can raise blood pressure.  ?? Learn how to check your blood pressure at home.  Lifestyle changes  ?? Stay at a healthy weight. This is especially important if you put on weight around the waist. Losing even 10 pounds can help you lower your blood pressure.  ?? If your doctor recommends it, get more exercise. Walking is a good choice. Bit by bit, increase the amount you walk every day. Try for at least 30 minutes on most days of the week. You also may want to swim, bike, or do other activities.  ?? Avoid or limit alcohol. Talk to your doctor about whether you can drink any alcohol.  ?? Try to limit how much sodium you eat to less than 2,300 milligrams (mg) a day. Your doctor may ask you to try to eat less than 1,500 mg a day.  ?? Eat plenty of fruits (such as bananas and oranges), vegetables, legumes,  whole grains, and low-fat dairy products.  ?? Lower the amount of saturated fat in your diet. Saturated fat is found in animal products such as milk, cheese, and meat. Limiting these foods may help you lose weight and also lower your risk for heart disease.  ?? Do not smoke. Smoking increases your risk for heart attack and stroke. If you need help quitting, talk to your doctor about stop-smoking programs and medicines. These can increase your chances of quitting for good.  When should you call for help?  Call 911 anytime you think you may need emergency care. This may mean having symptoms that suggest that your blood pressure is causing a serious heart or blood vessel problem. Your blood pressure may be over 180/120.  ??For example, call 911 if:  ?? ?? You have symptoms of a heart attack. These may include:  ? Chest pain or pressure, or a strange feeling in the chest.  ? Sweating.  ? Shortness of breath.  ? Nausea or vomiting.  ? Pain, pressure, or a strange feeling in the back, neck, jaw, or upper belly or in one or both shoulders or arms.  ? Lightheadedness or sudden weakness.  ? A fast or irregular heartbeat.   ?? ?? You have symptoms of a stroke. These may include:  ? Sudden numbness, tingling, weakness, or loss of movement in your face, arm, or leg, especially on only one side of your body.  ? Sudden vision changes.  ? Sudden trouble speaking.  ? Sudden confusion or trouble understanding simple statements.  ? Sudden problems with walking or balance.  ? A sudden, severe headache that is different from past headaches.   ?? ?? You have severe back or belly pain.   ??Do not wait until your blood pressure comes down on its own. Get help right away.  ??Call your doctor now or seek immediate care if:  ?? ?? Your blood pressure is much higher than normal (such as 180/120 or higher), but you don't have symptoms.   ?? ?? You think high blood pressure is causing symptoms, such as:  ? Severe headache.  ? Blurry vision.    ??Watch closely for changes in your health, and be sure to contact your doctor if:  ?? ?? Your blood pressure measures higher than your doctor recommends at least 2 times. That means the top number is higher  or the bottom number is higher, or both.   ?? ?? You think you may be having side effects from your blood pressure medicine.   Where can you learn more?  Go to [employer].  Enter [phone] in the search box to learn more about "High Blood Pressure: Care Instructions."  Current as of: July 19, 2016  Content Version: 11.8  ?? 2006-2018 [employer]. Care instructions adapted under license by [employer] (which disclaims liability or warranty for this information). If you have questions about a medical condition or this instruction, always ask your healthcare professional. [employer] disclaims any warranty or liability for your use of this information.

## 2017-08-27 NOTE — Progress Notes (Signed)
Chief Complaint   Patient presents with   ??? Transitions Of Care   ??? Hospital Follow Up     1. Have you been to the ER, urgent care clinic since your last visit?  Hospitalized since your last visit?Yes; fall with injury    2. Have you [facility] or [facility] any other [facility] since your [facility] visit?  Include any pap smears or colon screening. No   Fall Risk Assessment, last 12 mths 08/27/2017   Able to walk? Yes   Fall in past 12 months? Yes   Fall with injury? Yes   Number of falls in past 12 months 3   Fall Risk Score 4
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
## 2017-08-28 ENCOUNTER — Encounter: Primary: Internal Medicine

## 2017-08-28 ENCOUNTER — Telehealth

## 2017-08-28 NOTE — Telephone Encounter (Signed)
Doctor Lamar BenesEdward Peck Neuropsych Doctor's office Scheduling dept returned fax (I submitted for Jeanett SchleinBarbara Garinger) per Cristal FordErica Davis' request. It is stating it needs referr al changed from doctor khawaja to Dr. Lamar BenesEdward Peck PhD [address] [place] [place] [id number]

## 2017-08-28 NOTE — Addendum Note (Signed)
Addended by: Fletcher AnonAVIS, Deldrick Linch J on: 08/28/2017 03:32 PM     Modules accepted: Orders

## 2017-08-29 ENCOUNTER — Encounter: Primary: Internal Medicine

## 2017-08-29 ENCOUNTER — Encounter: Admit: 2017-08-29 | Discharge: 2017-08-29 | Payer: MEDICARE | Primary: Internal Medicine

## 2017-08-30 ENCOUNTER — Encounter: Admit: 2017-08-30 | Discharge: 2017-08-30 | Payer: MEDICARE | Primary: Internal Medicine

## 2017-08-31 ENCOUNTER — Encounter: Admit: 2017-08-31 | Discharge: 2017-08-31 | Payer: MEDICARE | Primary: Internal Medicine

## 2017-09-01 ENCOUNTER — Encounter: Primary: Internal Medicine

## 2017-09-03 ENCOUNTER — Encounter: Primary: Internal Medicine

## 2017-09-04 ENCOUNTER — Encounter: Admit: 2017-09-04 | Discharge: 2017-09-04 | Payer: MEDICARE | Primary: Internal Medicine

## 2017-09-06 ENCOUNTER — Encounter

## 2017-09-06 ENCOUNTER — Encounter: Admit: 2017-09-06 | Discharge: 2017-09-06 | Payer: MEDICARE | Primary: Internal Medicine

## 2017-09-06 MED ORDER — DIVALPROEX 250 MG TAB, DELAYED RELEASE
250 mg | ORAL_TABLET | ORAL | 0 refills | Status: DC
Start: 2017-09-06 — End: 2017-09-10

## 2017-09-06 NOTE — Telephone Encounter (Signed)
Called patient back. Sister answered the phone and stated her other sister called me and she will have to have her call me because she doesn't know what medication this is.    Dr. BLeonard Schwartz

## 2017-09-10 ENCOUNTER — Ambulatory Visit
Admit: 2017-09-10 | Discharge: 2017-09-10 | Payer: PRIVATE HEALTH INSURANCE | Attending: Nurse Practitioner | Primary: Internal Medicine

## 2017-09-10 ENCOUNTER — Encounter: Primary: Internal Medicine

## 2017-09-10 DIAGNOSIS — E559 Vitamin D deficiency, unspecified: Secondary | ICD-10-CM

## 2017-09-10 MED ORDER — TOPIRAMATE 50 MG TAB
50 mg | ORAL_TABLET | Freq: Two times a day (BID) | ORAL | 2 refills | Status: DC
Start: 2017-09-10 — End: 2017-10-12

## 2017-09-10 MED ORDER — LISINOPRIL 10 MG TAB
10 mg | ORAL_TABLET | Freq: Every day | ORAL | 6 refills | Status: DC
Start: 2017-09-10 — End: 2018-02-16

## 2017-09-10 MED ORDER — CHOLECALCIFEROL (VITAMIN D3) 1,000 UNIT CAPSULE
25 mcg (1,000 unit) | ORAL_CAPSULE | ORAL | 3 refills | Status: AC
Start: 2017-09-10 — End: ?

## 2017-09-10 MED ORDER — GABAPENTIN 100 MG CAP
100 mg | ORAL_CAPSULE | ORAL | 0 refills | Status: DC
Start: 2017-09-10 — End: 2017-11-09

## 2017-09-10 MED ORDER — LACOSAMIDE 150 MG TAB
150 mg | ORAL_TABLET | Freq: Two times a day (BID) | ORAL | 0 refills | Status: DC
Start: 2017-09-10 — End: 2017-09-16

## 2017-09-10 MED ORDER — FENOFIBRATE 54 MG TAB
54 mg | ORAL_TABLET | Freq: Every day | ORAL | 3 refills | Status: DC
Start: 2017-09-10 — End: 2017-09-12

## 2017-09-10 MED ORDER — CITALOPRAM 20 MG TAB
20 mg | ORAL_TABLET | Freq: Every day | ORAL | 3 refills | Status: DC
Start: 2017-09-10 — End: 2018-02-01

## 2017-09-10 MED ORDER — FLUTICASONE 50 MCG/ACTUATION NASAL SPRAY, SUSP
50 mcg/actuation | Freq: Every day | NASAL | 1 refills | Status: AC
Start: 2017-09-10 — End: ?

## 2017-09-10 MED ORDER — DIVALPROEX 250 MG TAB, DELAYED RELEASE
250 mg | ORAL_TABLET | ORAL | 3 refills | Status: DC
Start: 2017-09-10 — End: 2017-09-16
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
## 2017-09-10 NOTE — Progress Notes (Signed)
Chief Complaint   Patient presents with   ??? Hypertension   ??? Blood Pressure Check   ??? Seizure     1. Have you been to the ER, urgent care clinic since your last visit?  Hospitalized since your last visit?No    2. Have you seen or consulted any other health care providers [facility] since your last visit?  Include any pap smears or colon screening. No

## 2017-09-10 NOTE — Patient Instructions (Addendum)
Learning About Epilepsy  What is epilepsy?    Epilepsy is a common condition that causes repeated seizures. Seizures may cause problems with muscle control, movement, speech, vision, or awareness. They usually don't last very long, but they can be scary. Treatment usually works to control and reduce seizures.  What causes it?  Often doctors don't know what causes epilepsy. It's possible that it may run in families. But you don't have to have a family history to develop it.  What are the symptoms?  The main symptom of epilepsy is repeated seizures that happen without warning. There are different kinds of seizures. You may notice strange smells or sounds. You may lose control of your muscles. Or your body may twitch or jerk. Your symptoms will depend on the type of seizure you have.  How is it diagnosed?  Diagnosing epilepsy can be hard. Your doctor will ask questions to find out what happened just before, during, and right after a seizure. Your doctor will examine you. You'll have some tests, such as an electroencephalogram. This information can help your doctor decide what kind of seizures you have and if you have epilepsy.  How is it treated?  You can take medicines to control and reduce seizures. Which type you use depends on the type of seizure. You and your doctor will need to find the right combination, schedule, and dose of medicine. If medicine alone doesn't help, your doctor may suggest a special diet or surgery to help reduce seizures.  How can you care for yourself at home?  To control your seizures, you need to follow your treatment plan. If you take medicine to control seizures, you must take it exactly as prescribed.  The medicine works only if you take the right amount on the schedule your doctor sets up. Following this schedule keeps the right level of medicine in your body. Even missing just a few doses can allow seizures to happen.   You might be on a special ketogenic diet. If so, you'll need to follow the diet exactly for it to help prevent seizures.  As you follow your treatment plan, also try to figure out and avoid things that may make you more likely to have a seizure. These may include:  ?? Not getting enough sleep.  ?? Using drugs or alcohol.  ?? Being stressed.  ?? Skipping meals.  If you keep having seizures despite treatment, keep a record of them. Note the date, time of day, and any details about the seizure that you can remember. Your doctor can use this information to plan or adjust your medicine or other treatment. The record can also help your doctor find out what kinds of seizures you are having.  If you have epilepsy:  ?? Be sure that any doctor who treats you knows that you have epilepsy. And let the doctor know what medicines you take, if any.  ?? Wear a medical ID bracelet. If you have a seizure or accident that leaves you unconscious or unable to speak for yourself, the bracelet will let those who are treating you know that you have epilepsy. It will also list any medicines you take to control your seizures. That way, you won't be given any medicines that will react badly with those already in your body.  ?? Create a seizure first-aid plan with your friends and family. The plan will help them know how to help you. The kind of plan you need can depend on the kind of seizures   you have. Your doctor can tell you more about this.  Follow-up care is a key part of your treatment and safety. Be sure to make and go to all appointments, and call your doctor if you are having problems. It's also a good idea to know your test results and keep a list of the medicines you take.  Where can you learn more?  Go to http://www.healthwise.net/[employer].  Enter E175 in the search box to learn more about "Learning About Epilepsy."  Current as of: January 14, 2017  Content Version: 11.9   ?? 2006-2018 [employer]. Care instructions adapted under license by [employer] (which disclaims liability or warranty for this information). If you have questions about a medical condition or this instruction, always ask your healthcare professional. [employer] disclaims any warranty or liability for your use of this information.

## 2017-09-12 ENCOUNTER — Emergency Department: Admit: 2017-09-12 | Payer: MEDICARE | Primary: Internal Medicine

## 2017-09-12 ENCOUNTER — Encounter: Attending: Nurse Practitioner | Primary: Internal Medicine

## 2017-09-12 ENCOUNTER — Inpatient Hospital Stay
Admit: 2017-09-12 | Discharge: 2017-09-16 | Disposition: A | Discharge status: Home / Self Care | Payer: MEDICARE | Attending: Internal Medicine | Admitting: Internal Medicine

## 2017-09-12 ENCOUNTER — Inpatient Hospital Stay

## 2017-09-12 DIAGNOSIS — G9349 Other encephalopathy: Secondary | ICD-10-CM

## 2017-09-12 LAB — URINALYSIS W/ RFLX MICROSCOPIC
Bacteria: NEGATIVE /hpf
Bilirubin: NEGATIVE
Blood: NEGATIVE
Glucose: NEGATIVE mg/dL
Ketone: NEGATIVE mg/dL
Nitrites: NEGATIVE
Protein: NEGATIVE mg/dL
Specific gravity: >1.03 — ABNORMAL HIGH (ref 1.003–1.030)
Urobilinogen: 1 EU/dL (ref 0.2–1.0)
pH (UA): 8 (ref 5.0–8.0)

## 2017-09-12 LAB — LACTIC ACID: Lactic acid: 1.3 MMOL/L (ref 0.4–2.0)

## 2017-09-12 LAB — METABOLIC PANEL, COMPREHENSIVE
A-G Ratio: 0.9 — ABNORMAL LOW (ref 1.1–2.2)
ALT (SGPT): 16 U/L (ref 12–78)
AST (SGOT): 19 U/L (ref 15–37)
Albumin: 3.5 g/dL (ref 3.5–5.0)
Alk. phosphatase: 45 U/L (ref 45–117)
Anion gap: 8 mmol/L (ref 5–15)
BUN/Creatinine ratio: 9 — ABNORMAL LOW (ref 12–20)
BUN: 10 MG/DL (ref 6–20)
Bilirubin, total: 0.4 MG/DL (ref 0.2–1.0)
CO2: 26 mmol/L (ref 21–32)
Calcium: 9.1 MG/DL (ref 8.5–10.1)
Chloride: 106 mmol/L (ref 97–108)
Creatinine: 1.06 MG/DL — ABNORMAL HIGH (ref 0.55–1.02)
GFR est AA: >60 mL/min/{1.73_m2} (ref >60)
GFR est non-AA: 51 mL/min/{1.73_m2} — ABNORMAL LOW (ref >60)
Globulin: 4 g/dL (ref 2.0–4.0)
Glucose: 154 mg/dL — ABNORMAL HIGH (ref 65–100)
Potassium: 3.7 mmol/L (ref 3.5–5.1)
Protein, total: 7.5 g/dL (ref 6.4–8.2)
Sodium: 140 mmol/L (ref 136–145)

## 2017-09-12 LAB — CBC WITH AUTOMATED DIFF
ABS. BASOPHILS: 0 10*3/uL (ref 0.0–0.1)
ABS. EOSINOPHILS: 0 10*3/uL (ref 0.0–0.4)
ABS. IMM. GRANS.: 0 10*3/uL (ref 0.00–0.04)
ABS. LYMPHOCYTES: 2.1 10*3/uL (ref 0.8–3.5)
ABS. MONOCYTES: 0.6 10*3/uL (ref 0.0–1.0)
ABS. NEUTROPHILS: 3.5 10*3/uL (ref 1.8–8.0)
ABSOLUTE NRBC: 0 10*3/uL (ref 0.00–0.01)
BASOPHILS: 1 % (ref 0–1)
EOSINOPHILS: 1 % (ref 0–7)
HCT: 37.7 % (ref 35.0–47.0)
HGB: 12.1 g/dL (ref 11.5–16.0)
IMMATURE GRANULOCYTES: 0 % (ref 0.0–0.5)
LYMPHOCYTES: 33 % (ref 12–49)
MCH: 31.2 PG (ref 26.0–34.0)
MCHC: 32.1 g/dL (ref 30.0–36.5)
MCV: 97.2 FL (ref 80.0–99.0)
MONOCYTES: 9 % (ref 5–13)
MPV: 10.3 FL (ref 8.9–12.9)
NEUTROPHILS: 56 % (ref 32–75)
NRBC: 0 PER 100 WBC
PLATELET: 437 10*3/uL — ABNORMAL HIGH (ref 150–400)
RBC: 3.88 M/uL (ref 3.80–5.20)
RDW: 14.2 % (ref 11.5–14.5)
WBC: 6.2 10*3/uL (ref 3.6–11.0)

## 2017-09-12 LAB — DRUG SCREEN, URINE
AMPHETAMINES: NEGATIVE
BARBITURATES: NEGATIVE
BENZODIAZEPINES: NEGATIVE
COCAINE: NEGATIVE
METHADONE: NEGATIVE
OPIATES: NEGATIVE
PCP(PHENCYCLIDINE): NEGATIVE
THC (TH-CANNABINOL): NEGATIVE

## 2017-09-12 LAB — PROTHROMBIN TIME + INR
INR: 1.1 (ref 0.9–1.1)
Prothrombin time: 11.4 s — ABNORMAL HIGH (ref 9.0–11.1)

## 2017-09-12 LAB — GLUCOSE, POC: Glucose (POC): 137 mg/dL — ABNORMAL HIGH (ref 65–100)

## 2017-09-12 LAB — TROPONIN I: Troponin-I, Qt.: <0.05 ng/mL (ref <0.05)

## 2017-09-12 LAB — PTT: aPTT: 27.3 s (ref 22.1–32.0)

## 2017-09-12 LAB — VALPROIC ACID: Valproic acid: 39 ug/ml — ABNORMAL LOW (ref 50–100)

## 2017-09-12 LAB — AMMONIA: Ammonia, plasma: <10 umol/L (ref <32)

## 2017-09-12 MED ORDER — POLYETHYLENE GLYCOL 3350 17 GRAM (100 %) ORAL POWDER PACKET
17 gram | Freq: Every day | ORAL | Status: DC | PRN
Start: 2017-09-12 — End: 2017-09-16

## 2017-09-12 MED ORDER — LACTULOSE 10 GRAM/15 ML ORAL SOLUTION
10 gram/15 mL | Freq: Three times a day (TID) | ORAL | Status: DC
Start: 2017-09-12 — End: 2017-09-16
  Administered 2017-09-12 – 2017-09-16 (×13): via ORAL

## 2017-09-12 MED ORDER — LORAZEPAM 2 MG/ML IJ SOLN
2 mg/mL | INTRAMUSCULAR | Status: DC | PRN
Start: 2017-09-12 — End: 2017-09-16

## 2017-09-12 MED ORDER — ASPIRIN 81 MG CHEWABLE TAB
81 mg | Freq: Every day | ORAL | Status: DC
Start: 2017-09-12 — End: 2017-09-16
  Administered 2017-09-13 – 2017-09-16 (×4): via ORAL

## 2017-09-12 MED ORDER — LACOSAMIDE 50 MG TAB
50 mg | Freq: Two times a day (BID) | ORAL | Status: DC
Start: 2017-09-12 — End: 2017-09-13
  Administered 2017-09-12 – 2017-09-13 (×2): via ORAL

## 2017-09-12 MED ORDER — GABAPENTIN 100 MG CAP
100 mg | Freq: Three times a day (TID) | ORAL | Status: DC
Start: 2017-09-12 — End: 2017-09-16
  Administered 2017-09-12 – 2017-09-16 (×13): via ORAL

## 2017-09-12 MED ORDER — CITALOPRAM 20 MG TAB
20 mg | Freq: Every day | ORAL | Status: DC
Start: 2017-09-12 — End: 2017-09-16
  Administered 2017-09-13 – 2017-09-16 (×4): via ORAL

## 2017-09-12 MED ORDER — ACETAMINOPHEN (TYLENOL) SOLUTION 32MG/ML
ORAL | Status: DC | PRN
Start: 2017-09-12 — End: 2017-09-16

## 2017-09-12 MED ORDER — LISINOPRIL 5 MG TAB
5 mg | Freq: Every day | ORAL | Status: DC
Start: 2017-09-12 — End: 2017-09-16
  Administered 2017-09-13 – 2017-09-16 (×3): via ORAL

## 2017-09-12 MED ORDER — CEFTRIAXONE 1 GRAM SOLUTION FOR INJECTION
1 gram | INTRAMUSCULAR | Status: AC
Start: 2017-09-12 — End: 2017-09-16
  Administered 2017-09-13 – 2017-09-16 (×4): via INTRAVENOUS

## 2017-09-12 MED ORDER — SODIUM CHLORIDE 0.9 % IJ SYRG
INTRAMUSCULAR | Status: DC | PRN
Start: 2017-09-12 — End: 2017-09-16

## 2017-09-12 MED ORDER — SODIUM CHLORIDE 0.9% BOLUS IV
0.9 % | Freq: Once | INTRAVENOUS | Status: AC
Start: 2017-09-12 — End: 2017-09-12
  Administered 2017-09-12: 16:00:00 via INTRAVENOUS

## 2017-09-12 MED ORDER — DIVALPROEX 250 MG TAB, DELAYED RELEASE
250 mg | Freq: Every evening | ORAL | Status: DC
Start: 2017-09-12 — End: 2017-09-13
  Administered 2017-09-13: 03:00:00 via ORAL

## 2017-09-12 MED ORDER — SODIUM CHLORIDE 0.9 % IJ SYRG
Freq: Once | INTRAMUSCULAR | Status: AC
Start: 2017-09-12 — End: 2017-09-12
  Administered 2017-09-12: 15:00:00 via INTRAVENOUS

## 2017-09-12 MED ORDER — SODIUM CHLORIDE 0.9 % IJ SYRG
Freq: Three times a day (TID) | INTRAMUSCULAR | Status: DC
Start: 2017-09-12 — End: 2017-09-16
  Administered 2017-09-12 – 2017-09-16 (×13): via INTRAVENOUS

## 2017-09-12 MED ORDER — ACETAMINOPHEN 650 MG RECTAL SUPPOSITORY
650 mg | RECTAL | Status: DC | PRN
Start: 2017-09-12 — End: 2017-09-16

## 2017-09-12 MED ORDER — ATORVASTATIN 40 MG TAB
40 mg | Freq: Every evening | ORAL | Status: DC
Start: 2017-09-12 — End: 2017-09-16
  Administered 2017-09-13 – 2017-09-16 (×4): via ORAL

## 2017-09-12 MED ORDER — TOPIRAMATE 25 MG TAB
25 mg | Freq: Two times a day (BID) | ORAL | Status: DC
Start: 2017-09-12 — End: 2017-09-16
  Administered 2017-09-12 – 2017-09-16 (×10): via ORAL

## 2017-09-12 MED ORDER — HYDRALAZINE 20 MG/ML IJ SOLN
20 mg/mL | Freq: Four times a day (QID) | INTRAMUSCULAR | Status: DC | PRN
Start: 2017-09-12 — End: 2017-09-16

## 2017-09-12 MED ORDER — ACETAMINOPHEN 325 MG TABLET
325 mg | ORAL | Status: DC | PRN
Start: 2017-09-12 — End: 2017-09-16

## 2017-09-12 MED ORDER — SODIUM CHLORIDE 0.9 % IJ SYRG
Freq: Three times a day (TID) | INTRAMUSCULAR | Status: DC
Start: 2017-09-12 — End: 2017-09-16
  Administered 2017-09-12 – 2017-09-16 (×14): via INTRAVENOUS

## 2017-09-12 MED ORDER — IOPAMIDOL 76 % IV SOLN
370 mg iodine /mL (76 %) | Freq: Once | INTRAVENOUS | Status: AC
Start: 2017-09-12 — End: 2017-09-12
  Administered 2017-09-12: 15:00:00 via INTRAVENOUS

## 2017-09-12 MED ORDER — ENOXAPARIN 40 MG/0.4 ML SUB-Q SYRINGE
40 mg/0.4 mL | SUBCUTANEOUS | Status: DC
Start: 2017-09-12 — End: 2017-09-16
  Administered 2017-09-13 – 2017-09-16 (×4): via SUBCUTANEOUS

## 2017-09-12 MED ORDER — CEFTRIAXONE 1 GRAM SOLUTION FOR INJECTION
1 gram | INTRAMUSCULAR | Status: DC
Start: 2017-09-12 — End: 2017-09-12

## 2017-09-12 MED ORDER — SODIUM CHLORIDE 0.9 % IV PIGGY BACK
1 gram | INTRAVENOUS | Status: AC
Start: 2017-09-12 — End: 2017-09-12
  Administered 2017-09-12: 19:00:00 via INTRAVENOUS

## 2017-09-12 MED ORDER — HYDRALAZINE 20 MG/ML IJ SOLN
20 mg/mL | Freq: Four times a day (QID) | INTRAMUSCULAR | Status: DC | PRN
Start: 2017-09-12 — End: 2017-09-12

## 2017-09-12 MED FILL — TOPIRAMATE 25 MG TAB: 25 mg | ORAL | Qty: 2

## 2017-09-12 MED FILL — CEFTRIAXONE 1 GRAM SOLUTION FOR INJECTION: 1 gram | INTRAMUSCULAR | Qty: 1

## 2017-09-12 MED FILL — VIMPAT 50 MG TABLET: 50 mg | ORAL | Qty: 1

## 2017-09-12 MED FILL — GABAPENTIN 100 MG CAP: 100 mg | ORAL | Qty: 1

## 2017-09-12 MED FILL — LACTULOSE 20 GRAM/30 ML ORAL SOLUTION: 20 gram/30 mL | ORAL | Qty: 30

## 2017-09-12 NOTE — Progress Notes (Signed)
Pharmacy Clarification of Prior to Admission Medication Regimen     The patient was not interviewed regarding clarification of the prior to admission medication regimen due to AMS.     Patient stated her sisters manage her medications. Patient also had her prescription bottles present in the ED room.    MHT called the patient's sister, Rosalee Kaufmanrnestine (Ernie), [phone], who was able to verify the patient's medications, and stated she fills the patient's pill organizer. This sister stated the patient lives with her other sister, Eber JonesCarolyn. MHT called Eber JonesCarolyn, [phone], who was able to verify last doses administered to the patient.    Information Obtained From: Patient's sisters, prescription bottles, RX Query    Pertinent Pharmacy Findings:  ? Patient is a poor historian, and lives with her sister, Eber JonesCarolyn.     PTA medication list was corrected to the following:     Prior to Admission Medications   Prescriptions Last Dose Informant Patient Reported? Taking?   SUMAtriptan (IMITREX) 100 mg tablet 09/11/2017 at Unknown time Other Yes Yes   Sig: Take 100 mg by mouth once as needed for Migraine. May repeat 1 tab in 2 hours, limit: 2 tabs in 24 hours, not more than 2 days a week.   aspirin 81 mg chewable tablet 09/12/2017 at Unknown time Family Member Yes Yes   Sig: Take 81 mg by mouth daily.   cholecalciferol (VITAMIN D3) 1,000 unit cap 09/12/2017 at Unknown time Other No Yes   Sig: take 1 capsule by mouth once daily   citalopram (CELEXA) 20 mg tablet 09/12/2017 at Unknown time Other No Yes   Sig: Take 1 Tab by mouth daily.   divalproex DR (DEPAKOTE) 250 mg tablet 09/11/2017 at Unknown time Other No Yes   Sig: take 1 tablet by mouth QHS   donepezil (ARICEPT) 10 mg tablet 09/11/2017 at Unknown time Other No Yes   Sig: Take 1 Tab by mouth nightly.   fenofibrate (LOFIBRA) 54 mg tablet 09/11/2017 at Unknown time Other Yes Yes   Sig: Take 54 mg by mouth nightly.    fluticasone (FLONASE) 50 mcg/actuation nasal spray 09/12/2017 at Unknown time Other No Yes   Sig: 1 Spray by Both Nostrils route daily.   gabapentin (NEURONTIN) 100 mg capsule 09/12/2017 at Unknown time Other No Yes   Sig: take 1 capsule by mouth three times a day   lacosamide (VIMPAT) 150 mg tab tablet 09/12/2017 at Unknown time Other No Yes   Sig: Take 1 Tab by mouth two (2) times a day. Max Daily Amount: 300 mg.   lactulose (ENULOSE) 10 gram/15 mL solution 09/12/2017 at Unknown time Family Member Yes Yes   Sig: Take 1 tsp by mouth three (3) times daily.   lisinopril (PRINIVIL, ZESTRIL) 10 mg tablet 09/12/2017 at Unknown time Other No Yes   Sig: Take 1 Tab by mouth daily.   methylphenidate HCl (RITALIN) 5 mg tablet 09/12/2017 at Unknown time Other No Yes   Sig: Take 1 Tab (5 mg total) by mouth dailyEarliest Fill Date: 05/11/17.  Max Daily Amount: 5 mg   topiramate (TOPAMAX) 50 mg tablet 09/12/2017 at Unknown time Other No Yes   Sig: Take 1 Tab by mouth two (2) times a day.      Facility-Administered Medications: None          Thank you,  Hendricks MiloJacquline Indy Kuck, CPhT  Medication History Pharmacologistharmacy Technician

## 2017-09-12 NOTE — ED Notes (Signed)
[facility], pt remains confused, difficult to redirect.

## 2017-09-12 NOTE — Progress Notes (Signed)
This writer looking to resolve current episode with pt's sister. Chart review reveals pt is [facility] for potential stroke vs seizure. This Clinical research associatewriter will continue to monitor pt's progress and reach out to in patient case manager should pt be admitted. This episode will be resolved if pt's admitted, opening another as indicated.

## 2017-09-12 NOTE — ED Notes (Signed)
[facility] approx 100 [facility]. Pt cleansed of urine and returned to bed, 1:1 Brandi ED tech at bedside as 1on 1 for safety

## 2017-09-12 NOTE — Progress Notes (Signed)
Spiritual Care Assessment/Progress Note  [facility]      NAME: Christiana FuchsBarbara J Kolodny      MRN: [id number]  AGE: 72 y.o. SEX: female  Religious Affiliation: Baptist   Language: English     09/12/2017     Total Time (in minutes): 10     Spiritual Assessment begun in [facility] EMERGENCY DEPT through conversation with:         [x] Patient        [x]  Family    []  Friend(s)        Reason for Consult: Initial visit, Initial/Spiritual assessment, patient floor     Spiritual beliefs: (Please include comment if needed)     []  Identifies with a faith tradition:         []  Supported by a faith community:            []  Claims no spiritual orientation:           []  Seeking spiritual identity:                []  Adheres to an individual form of spirituality:           [x]  Not able to assess:                           Identified resources for coping:      []  Prayer                               []  Music                  []  Guided Imagery     [x]  Family/friends                 []  Pet visits     []  Devotional reading                         []  Unknown     []  Other:                                             Interventions offered during this visit: (See comments for more details)          Family/Friend(s): Prayer (assurance of), Affirmation of faith     Plan of Care:     [x]  Support spiritual and/or cultural needs    []  Support AMD and/or advance care planning process      []  Support grieving process   []  Coordinate Rites and/or Rituals    []  Coordination with community clergy   []  No spiritual needs identified at this time   []  Detailed Plan of Care below (See Comments)  []  Make referral to Music Therapy  []  Make referral to Pet Therapy     []  Make referral to Addiction services  []  [facility]  []  Make referral to Spiritual Care Partner  []  No future visits requested        [x]  Follow up visits as needed     Comments:   Attempted Initial Spiritual Assessment in __ED-23. Unable to assess  patient???s needs due to patients condition. Daughter family present at this time. Chaplains will follow as able and/or as needed.  Chaplain Jenel Lucks, MDiv  Pager: [phone]
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
## 2017-09-12 NOTE — ED Notes (Signed)
Assisted pt with bedside commode. Pt is impulsive, while trying to get pt on bedpan she was pulling leads off and BP cuff. Advised pt's sister who is at the bedside to notify staff when she leaves.
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
## 2017-09-12 NOTE — ED Notes (Signed)
Verbal shift change report given to Trish  by Cinquella  RN  Report included the following information SBAR, Kardex, MAR and Recent Results.

## 2017-09-12 NOTE — ED Notes (Signed)
Pt pulled her IV out, she removed all cardiac leads and bp cuff off. Pt attempted to get out of bed. Charge nurse notified of patient needing a 1:1 sitter;  ED tech is currently sitting with patient.

## 2017-09-12 NOTE — ED Triage Notes (Signed)
Code S level 2 called
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
## 2017-09-13 ENCOUNTER — Inpatient Hospital Stay: Admit: 2017-09-14 | Payer: MEDICARE | Primary: Internal Medicine

## 2017-09-13 LAB — METABOLIC PANEL, BASIC
Anion gap: 8 mmol/L (ref 5–15)
BUN/Creatinine ratio: 9 — ABNORMAL LOW (ref 12–20)
BUN: 8 MG/DL (ref 6–20)
CO2: 25 mmol/L (ref 21–32)
Calcium: 9.1 MG/DL (ref 8.5–10.1)
Chloride: 107 mmol/L (ref 97–108)
Creatinine: 0.91 MG/DL (ref 0.55–1.02)
GFR est AA: >60 mL/min/{1.73_m2} (ref >60)
GFR est non-AA: >60 mL/min/{1.73_m2} (ref >60)
Glucose: 62 mg/dL — ABNORMAL LOW (ref 65–100)
Potassium: 3.5 mmol/L (ref 3.5–5.1)
Sodium: 140 mmol/L (ref 136–145)

## 2017-09-13 LAB — HEMOGLOBIN A1C WITH EAG
Est. average glucose: 103 mg/dL
Hemoglobin A1c: 5.2 % (ref 4.2–6.3)

## 2017-09-13 LAB — CBC W/O DIFF
ABSOLUTE NRBC: 0 10*3/uL (ref 0.00–0.01)
HCT: 36.9 % (ref 35.0–47.0)
HGB: 11.8 g/dL (ref 11.5–16.0)
MCH: 31.1 PG (ref 26.0–34.0)
MCHC: 32 g/dL (ref 30.0–36.5)
MCV: 97.1 FL (ref 80.0–99.0)
MPV: 10.2 FL (ref 8.9–12.9)
NRBC: 0 PER 100 WBC
PLATELET: 392 10*3/uL (ref 150–400)
RBC: 3.8 M/uL (ref 3.80–5.20)
RDW: 14.2 % (ref 11.5–14.5)
WBC: 6.6 10*3/uL (ref 3.6–11.0)

## 2017-09-13 LAB — LIPID PANEL
CHOL/HDL Ratio: 3.8 (ref 0–5.0)
Cholesterol, total: 192 MG/DL (ref <200)
HDL Cholesterol: 50 MG/DL
LDL, calculated: 125.2 MG/DL — ABNORMAL HIGH (ref 0–100)
Triglyceride: 84 MG/DL (ref <150)
VLDL, calculated: 16.8 MG/DL

## 2017-09-13 LAB — TSH 3RD GENERATION: TSH: 0.96 u[IU]/mL (ref 0.36–3.74)

## 2017-09-13 MED ORDER — DIVALPROEX 250 MG TAB, DELAYED RELEASE
250 mg | Freq: Two times a day (BID) | ORAL | Status: DC
Start: 2017-09-13 — End: 2017-09-16
  Administered 2017-09-13 – 2017-09-16 (×8): via ORAL

## 2017-09-13 MED ORDER — LACOSAMIDE 100 MG TAB
100 mg | Freq: Two times a day (BID) | ORAL | Status: DC
Start: 2017-09-13 — End: 2017-09-16
  Administered 2017-09-13 – 2017-09-16 (×8): via ORAL

## 2017-09-13 MED FILL — ENOXAPARIN 40 MG/0.4 ML SUB-Q SYRINGE: 40 mg/0.4 mL | SUBCUTANEOUS | Qty: 0.4

## 2017-09-13 MED FILL — BD POSIFLUSH NORMAL SALINE 0.9 % INJECTION SYRINGE: INTRAMUSCULAR | Qty: 10

## 2017-09-13 MED FILL — ATORVASTATIN 40 MG TAB: 40 mg | ORAL | Qty: 1

## 2017-09-13 MED FILL — VIMPAT 100 MG TABLET: 100 mg | ORAL | Qty: 1

## 2017-09-13 MED FILL — GABAPENTIN 100 MG CAP: 100 mg | ORAL | Qty: 1

## 2017-09-13 MED FILL — LACTULOSE 20 GRAM/30 ML ORAL SOLUTION: 20 gram/30 mL | ORAL | Qty: 30

## 2017-09-13 MED FILL — CEFTRIAXONE 1 GRAM SOLUTION FOR INJECTION: 1 gram | INTRAMUSCULAR | Qty: 1

## 2017-09-13 MED FILL — DEPAKOTE 250 MG TABLET,DELAYED RELEASE: 250 mg | ORAL | Qty: 1

## 2017-09-13 MED FILL — CHILDREN'S ASPIRIN 81 MG CHEWABLE TABLET: 81 mg | ORAL | Qty: 1

## 2017-09-13 MED FILL — TOPIRAMATE 25 MG TAB: 25 mg | ORAL | Qty: 2

## 2017-09-13 MED FILL — CITALOPRAM 20 MG TAB: 20 mg | ORAL | Qty: 1

## 2017-09-13 MED FILL — DEPAKOTE 250 MG TABLET,DELAYED RELEASE: 250 mg | ORAL | Qty: 2

## 2017-09-13 MED FILL — LISINOPRIL 5 MG TAB: 5 mg | ORAL | Qty: 2

## 2017-09-13 MED FILL — VIMPAT 50 MG TABLET: 50 mg | ORAL | Qty: 1

## 2017-09-13 NOTE — Other (Signed)
Bedside, Verbal and Written shift change report given to Jackie, RN (oncoming nurse) by Julie, RN (offgoing nurse). Report included the following information SBAR, Kardex, MAR and Recent Results.
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
## 2017-09-13 NOTE — Procedures (Signed)
Patient Name: Meghan [name]  DOB: 01/06/1946  Age: 71 y.o.    Ordering physician: No ref. provider found  Date of EEG: 09/13/2017  EEG procedure number: [id number]  Diagnosis: alt mental status  Interpreting physician: Deyonte Cadden, MD    ELECTROENCEPHALOGRAM REPORT     PROCEDURE: EEG.     CLINICAL INDICATION: The patient is a 71 y.o. female with a history of possible seizures. EEG to rule out seizures, rule out stroke, rule out cortical abnormality.     EEG CLASSIFICATION: Abnormal     DESCRIPTION OF THE RECORD:   The background of this recording contains a posteriorly-located occipital alpha rhythm of 6 Hz that attenuates with eye opening.     Throughout the recording, there were no clear areas of focal slowing nor spike or spike-and-wave discharges seen.     Hyperventilation was not performed. Photic stimulation produced no response.     During the recording the patient did not achieve stage II sleep    INTERPRETATION:   Abnormal. Moderate diffuse cerebral dysfunction which is non specific for etiology but can be seen in toxic/metabolic states. No seizures. Clinical correlation recommended.      Aubert Choyce, MD  Neurologist
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
## 2017-09-13 NOTE — Progress Notes (Signed)
Hospitalist Progress Note  NAME: Meghan [name]   DOB:  Jul 19, 1946   MRN:  [id number]       Assessment / Plan:  Acute encephalopathy in the setting of possible seizure vs acute CVA  Hx of post traumatic epilepsy  -EEG obtained -- abnormal with moderate diffuse cerebral dysfunction but no active seizures at time of EEG  -MRI brain pending; echocardiogram pending  -TSH within normal limits, lipids with LDL above goal (on statin), A1c in normal range, ammonia negative, UDS negative  -allow permissive HTN with PRN hydralazine  -seizure precaution  -cont antiepileptics -- doses amended per neurology  -Recommended for return to home on hospital discharge  ??  Abnormal UA  -Urine growing GNR; will follow culture and continue antibiotics  ??  HTN  -allow permissive HTN for 24 hrs, resume meds in the AM (meds already ordered for the AM)  -prn hydralazine   ??  Hx psychiatric illness and TBI??  -hold aricept for now  ??  Code Status: Full  Surrogate Decision Maker: sister Othelia Pulling  DVT Prophylaxis: lovenox  GI Prophylaxis: not indicated  Baseline: lives with pt's sister Rayfield Citizen.  Pt's daughter can longer takes care of pt.  Able to ambulate at baseline.    Disposition: await MRI result, plan for home on discharge     Subjective:     Chief Complaint / Reason for Physician Visit: follow up mental status change    Patient denies complaints but had difficulty participating in interview. Discussed with RN events overnight.     Review of Systems:  Symptom Y/N Comments  Symptom Y/N Comments   Fever/Chills    Chest Pain     Poor Appetite    Edema     Cough    Abdominal Pain     Sputum    Joint Pain     SOB/DOE    Pruritis/Rash     Nausea/vomit    Tolerating PT/OT     Diarrhea    Tolerating Diet     Constipation    Other       Could NOT obtain due to: Unreliable responses, mostly repeats after interviewer     Objective:     VITALS:   Last 24hrs VS reviewed since prior progress note. Most recent are:   Patient Vitals for the past 24 hrs:   Temp Pulse Resp BP SpO2   09/13/17 1512 99.2 ??F (37.3 ??C) 78 16 121/71 98 %   09/13/17 1109 98.7 ??F (37.1 ??C) 72 18 122/75 100 %   09/13/17 0749 98.4 ??F (36.9 ??C) 67 18 116/73 98 %   09/13/17 0308 98.1 ??F (36.7 ??C) 65 18 107/67 100 %   09/12/17 2347 98.2 ??F (36.8 ??C) 88 18 103/76 98 %   09/12/17 1934 98 ??F (36.7 ??C) 83 18 139/84 99 %       Intake/Output Summary (Last 24 hours) at 09/13/2017 1741  Last data filed at 09/13/2017 1323  Gross per 24 hour   Intake 260 ml   Output 250 ml   Net 10 ml        PHYSICAL EXAM:  General: WD, WN. Alert, cooperative, no acute distress????  EENT:  EOMI. Anicteric sclerae. MMM  Resp:  CTA bilaterally, no wheezing or rales.  No accessory muscle use  CV:  Regular  rhythm,?? No edema  GI:  Soft, Non distended, Non tender. ??+Bowel sounds  Neurologic:?? Alert, does not reliably answer orientation questions or follow commands appropriately.  Strength intact. Hard of hearing.  Psych:???? Good insight.??Not anxious nor agitated  Skin:  No rashes.  No jaundice    Reviewed most current lab test results and cultures  YES  Reviewed most current radiology test results   YES  Review and summation of old records today    NO  Reviewed patient's current orders and MAR    YES  PMH/SH reviewed - no change compared to H&P  ________________________________________________________________________  Care Plan discussed with:    Comments   Patient y    [facility]      RN     Care Manager     Consultant                       y Multidiciplinary team rounds were held today with case manager, nursing, pharmacist and Higher education careers adviserclinical coordinator.  Patient's plan of care was discussed; medications were reviewed and discharge planning was addressed.     ________________________________________________________________________  Total NON critical care TIME:  15   Minutes    Total CRITICAL CARE TIME Spent:   Minutes non procedure based      Comments    >50% of visit spent in counseling and coordination of care     ________________________________________________________________________  Margie EgeKerry C Adalia Pettis, MD     Procedures: see electronic medical records for all procedures/Xrays and details which were not copied into this note but were reviewed prior to creation of Plan.      LABS:  I reviewed today's most current labs and imaging studies.  Pertinent labs include:  Recent Labs     09/13/17  0309 09/12/17  0954   WBC 6.6 6.2   HGB 11.8 12.1   HCT 36.9 37.7   PLT 392 437*     Recent Labs     09/13/17  0309 09/12/17  0954   NA 140 140   K 3.5 3.7   CL 107 106   CO2 25 26   GLU 62* 154*   BUN 8 10   CREA 0.91 1.06*   CA 9.1 9.1   ALB  --  3.5   TBILI  --  0.4   SGOT  --  19   ALT  --  16   INR  --  1.1       Signed: Margie EgeKerry C Twila Rappa, MD
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
## 2017-09-13 NOTE — Progress Notes (Signed)
MRI NOTE      MRI PENDING   MRI screening sheet needs to completed and   Signed    Call [phone] when this is done    Fax [phone]

## 2017-09-13 NOTE — Progress Notes (Signed)
Speech Pathology  Orders received. Chart reviewed. Spoke with RN. RN reports no difficulty with regular diet/thin liquids and meds. Patient has a history of psychiatric illness and TBI. RN reports that cognitive status appears to be at baseline. She is admitted with encephalopathy vs CVA. Head CT negative for acute process. Awaiting MRI. Will hold eval until after MRI results and then determine if SLP evaluation is appropriate.
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
## 2017-09-13 NOTE — Progress Notes (Signed)
Problem: Falls - Risk of  Goal: *Absence of Falls  Document [name] Fall Risk and appropriate interventions in the flowsheet.  Outcome: Progressing Towards Goal  Fall Risk Interventions:  Mobility Interventions: Bed/chair exit alarm, Communicate number of staff needed for ambulation/transfer, OT consult for ADLs, Patient to call before getting OOB, PT Consult for mobility concerns, PT Consult for assist device competence, Utilize walker, cane, or other assistive device    Mentation Interventions: Adequate sleep, hydration, pain control, Bed/chair exit alarm, Door open when patient unattended, Evaluate medications/consider consulting pharmacy, Family/sitter at bedside, More frequent rounding, Toileting rounds    Medication Interventions: Bed/chair exit alarm, Evaluate medications/consider consulting pharmacy, Patient to call before getting OOB, Teach patient to arise slowly    Elimination Interventions: Bed/chair exit alarm, Call light in reach, Patient to call for help with toileting needs, Toileting schedule/hourly rounds    History of Falls Interventions: Bed/chair exit alarm, Consult care management for discharge planning, Door open when patient unattended, Evaluate medications/consider consulting pharmacy

## 2017-09-13 NOTE — Progress Notes (Signed)
Problem: Falls - Risk of  Goal: *Absence of Falls  Document [name] Fall Risk and appropriate interventions in the flowsheet.  Outcome: Progressing Towards Goal  Fall Risk Interventions:  Mobility Interventions: Bed/chair exit alarm, Communicate number of staff needed for ambulation/transfer, Patient to call before getting OOB    Mentation Interventions: Adequate sleep, hydration, pain control, Family/sitter at bedside    Medication Interventions: Bed/chair exit alarm, Evaluate medications/consider consulting pharmacy    Elimination Interventions: Bed/chair exit alarm, Toileting schedule/hourly rounds    History of Falls Interventions: Bed/chair exit alarm, Evaluate medications/consider consulting pharmacy

## 2017-09-13 NOTE — Procedures (Signed)
Patient Name: Christiana FuchsBarbara J Cansler  DOB: 01-14-1946  Age: 72 y.o.    Ordering physician: No ref. provider found  Date of EEG: 09/13/2017  EEG procedure number: [id number]  Diagnosis: alt mental status  Interpreting physician: Doroteo BradfordSiddhartha Jahaira Earnhart, MD    ELECTROENCEPHALOGRAM REPORT     PROCEDURE: EEG.     CLINICAL INDICATION: The patient is a 72 y.o. female with a history of possible seizures. EEG to rule out seizures, rule out stroke, rule out cortical abnormality.     EEG CLASSIFICATION: Abnormal     DESCRIPTION OF THE RECORD:   The background of this recording contains a posteriorly-located occipital alpha rhythm of 6 Hz that attenuates with eye opening.     Throughout the recording, there were no clear areas of focal slowing nor spike or spike-and-wave discharges seen.     Hyperventilation was not performed. Photic stimulation produced no response.     During the recording the patient did not achieve stage II sleep    INTERPRETATION:   Abnormal. Moderate diffuse cerebral dysfunction which is non specific for etiology but can be seen in toxic/metabolic states. No seizures. Clinical correlation recommended.      Doroteo BradfordSiddhartha Chanson Teems, MD  Neurologist
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
## 2017-09-14 LAB — CULTURE, URINE
Colonies Counted: 1000
Colony Count: 1000

## 2017-09-14 LAB — TOPIRAMATE: Topiramate: NOT DETECTED ug/mL (ref 2.0–25.0)

## 2017-09-14 MED FILL — ATORVASTATIN 40 MG TAB: 40 mg | ORAL | Qty: 1

## 2017-09-14 MED FILL — LACTULOSE 20 GRAM/30 ML ORAL SOLUTION: 20 gram/30 mL | ORAL | Qty: 30

## 2017-09-14 MED FILL — BD POSIFLUSH NORMAL SALINE 0.9 % INJECTION SYRINGE: INTRAMUSCULAR | Qty: 10

## 2017-09-14 MED FILL — DEPAKOTE 250 MG TABLET,DELAYED RELEASE: 250 mg | ORAL | Qty: 2

## 2017-09-14 MED FILL — VIMPAT 100 MG TABLET: 100 mg | ORAL | Qty: 1

## 2017-09-14 MED FILL — TOPIRAMATE 25 MG TAB: 25 mg | ORAL | Qty: 2

## 2017-09-14 MED FILL — GABAPENTIN 100 MG CAP: 100 mg | ORAL | Qty: 1

## 2017-09-14 MED FILL — CHILDREN'S ASPIRIN 81 MG CHEWABLE TABLET: 81 mg | ORAL | Qty: 1

## 2017-09-14 MED FILL — ENOXAPARIN 40 MG/0.4 ML SUB-Q SYRINGE: 40 mg/0.4 mL | SUBCUTANEOUS | Qty: 0.4

## 2017-09-14 MED FILL — LISINOPRIL 5 MG TAB: 5 mg | ORAL | Qty: 2

## 2017-09-14 MED FILL — CEFTRIAXONE 1 GRAM SOLUTION FOR INJECTION: 1 gram | INTRAMUSCULAR | Qty: 1

## 2017-09-14 MED FILL — CITALOPRAM 20 MG TAB: 20 mg | ORAL | Qty: 1
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
## 2017-09-14 NOTE — Progress Notes (Signed)
Spiritual Care Partner Volunteer visited patient in Ortho on 09/14/17.  Documented by:  Chaplain David McIlvaine, MDiv  Pager: [phone]

## 2017-09-14 NOTE — Progress Notes (Signed)
Hospitalist Progress Note  NAME: Meghan [name]   DOB:  1946/08/03   MRN:  [id number]       Assessment / Plan:  Acute encephalopathy in the setting of possible seizure vs acute CVA  Hx of post traumatic epilepsy  -EEG obtained -- abnormal with moderate diffuse cerebral dysfunction but no active seizures at time of EEG  -MRI brain with chronic changes but no acute abnormalities  -Echo still pending  -TSH within normal limits, lipids with LDL above goal (on statin), A1c in normal range, ammonia negative, UDS negative  -seizure precautions  -cont antiepileptics -- doses amended per neurology  -Recommended for return to home on hospital discharge  ??  Abnormal UA  -Urine growing Proteus, pansensitive  ??  HTN  -Fairly well controlled at this time  ??  Hx psychiatric illness and TBI??  -hold aricept for now  ??  Code Status: Full  Surrogate Decision Maker: sister Othelia Pulling  DVT Prophylaxis: lovenox  GI Prophylaxis: not indicated  Baseline: lives with pt's sister Rayfield Citizen.  Pt's daughter can longer takes care of pt.  Able to ambulate at baseline.    Disposition: await echo result, plan for home on discharge     Subjective:     Chief Complaint / Reason for Physician Visit: follow up mental status change    Patient denies complaints but had difficulty participating in interview. Discussed with RN events overnight.     Review of Systems:  Symptom Y/N Comments  Symptom Y/N Comments   Fever/Chills    Chest Pain     Poor Appetite    Edema     Cough    Abdominal Pain     Sputum    Joint Pain     SOB/DOE    Pruritis/Rash     Nausea/vomit    Tolerating PT/OT     Diarrhea    Tolerating Diet     Constipation    Other       Could NOT obtain due to: Unreliable responses, mostly repeats after interviewer     Objective:     VITALS:   Last 24hrs VS reviewed since prior progress note. Most recent are:  Patient Vitals for the past 24 hrs:   Temp Pulse Resp BP SpO2   09/14/17 1146 98.4 ??F (36.9 ??C) 71 20 114/87 100 %    09/14/17 0706 98.4 ??F (36.9 ??C) 72 20 101/54 98 %   09/14/17 0332 98.3 ??F (36.8 ??C) 79 18 106/69 99 %   09/13/17 2305 98.8 ??F (37.1 ??C) 86 20 130/76 99 %   09/13/17 2045 99.1 ??F (37.3 ??C) 79 20 110/74 ???       Intake/Output Summary (Last 24 hours) at 09/14/2017 1530  Last data filed at 09/14/2017 1319  Gross per 24 hour   Intake 240 ml   Output ???   Net 240 ml        PHYSICAL EXAM:  General: WD, WN. Alert, cooperative, no acute distress????  EENT:  EOMI. Anicteric sclerae. MMM  Resp:  CTA bilaterally, no wheezing or rales.  No accessory muscle use  CV:  Regular  rhythm,?? No edema  GI:  Soft, Non distended, Non tender. ??+Bowel sounds  Neurologic:?? Alert, hard of hearing, frequently comes up with nonsensical answers to questions and then repeats those answers. Follows commands.  Psych:???? Good insight.??Not anxious nor agitated  Skin:  No rashes.  No jaundice    Reviewed most current lab test results and cultures  YES  Reviewed most current radiology test results   YES  Review and summation of old records today    NO  Reviewed patient's current orders and MAR    YES  PMH/SH reviewed - no change compared to H&P  ________________________________________________________________________  Care Plan discussed with:    Comments   Patient y    [facility] CampusFamily      RN     Care Manager     Consultant                       y Multidiciplinary team rounds were held today with case manager, nursing, pharmacist and Higher education careers adviserclinical coordinator.  Patient's plan of care was discussed; medications were reviewed and discharge planning was addressed.     ________________________________________________________________________  Total NON critical care TIME:  15   Minutes    Total CRITICAL CARE TIME Spent:   Minutes non procedure based      Comments   >50% of visit spent in counseling and coordination of care     ________________________________________________________________________  Margie EgeKerry C Mignonne Afonso, MD      Procedures: see electronic medical records for all procedures/Xrays and details which were not copied into this note but were reviewed prior to creation of Plan.      LABS:  I reviewed today's most current labs and imaging studies.  Pertinent labs include:  Recent Labs     09/13/17  0309 09/12/17  0954   WBC 6.6 6.2   HGB 11.8 12.1   HCT 36.9 37.7   PLT 392 437*     Recent Labs     09/13/17  0309 09/12/17  0954   NA 140 140   K 3.5 3.7   CL 107 106   CO2 25 26   GLU 62* 154*   BUN 8 10   CREA 0.91 1.06*   CA 9.1 9.1   ALB  --  3.5   TBILI  --  0.4   SGOT  --  19   ALT  --  16   INR  --  1.1       Signed: Margie EgeKerry C Jessee Mezera, MD

## 2017-09-15 MED FILL — LISINOPRIL 5 MG TAB: 5 mg | ORAL | Qty: 2

## 2017-09-15 MED FILL — LACTULOSE 20 GRAM/30 ML ORAL SOLUTION: 20 gram/30 mL | ORAL | Qty: 30

## 2017-09-15 MED FILL — TOPIRAMATE 25 MG TAB: 25 mg | ORAL | Qty: 2

## 2017-09-15 MED FILL — BD POSIFLUSH NORMAL SALINE 0.9 % INJECTION SYRINGE: INTRAMUSCULAR | Qty: 10

## 2017-09-15 MED FILL — CEFTRIAXONE 1 GRAM SOLUTION FOR INJECTION: 1 gram | INTRAMUSCULAR | Qty: 1

## 2017-09-15 MED FILL — GABAPENTIN 100 MG CAP: 100 mg | ORAL | Qty: 1

## 2017-09-15 MED FILL — VIMPAT 100 MG TABLET: 100 mg | ORAL | Qty: 1

## 2017-09-15 MED FILL — ATORVASTATIN 40 MG TAB: 40 mg | ORAL | Qty: 1

## 2017-09-15 MED FILL — DEPAKOTE 250 MG TABLET,DELAYED RELEASE: 250 mg | ORAL | Qty: 2

## 2017-09-15 MED FILL — CHILDREN'S ASPIRIN 81 MG CHEWABLE TABLET: 81 mg | ORAL | Qty: 1

## 2017-09-15 MED FILL — CITALOPRAM 20 MG TAB: 20 mg | ORAL | Qty: 1

## 2017-09-15 MED FILL — ENOXAPARIN 40 MG/0.4 ML SUB-Q SYRINGE: 40 mg/0.4 mL | SUBCUTANEOUS | Qty: 0.4

## 2017-09-15 NOTE — Progress Notes (Addendum)
1900--bedside report received from Rockvalemarcella, rn. Pt resting in bed oriented x 2, denies pain, nausea, sitter at bedside, call bell in reach assessment as noted    2200--pt resting comfortably in bed, sitter at bedside. Call bell in reach    0400--pt assisted to bathroom, back to bed, no complaints, sitter at bedside    0700--bedside report given to maricar, rn who is assuming care of pt

## 2017-09-15 NOTE — Progress Notes (Signed)
Problem: Falls - Risk of  Goal: *Absence of Falls  Document [name] Fall Risk and appropriate interventions in the flowsheet.  Outcome: Progressing Towards Goal  Fall Risk Interventions:  Mobility Interventions: Patient to call before getting OOB    Mentation Interventions: Bed/chair exit alarm, Family/sitter at bedside    Medication Interventions: Patient to call before getting OOB    Elimination Interventions: Bed/chair exit alarm    History of Falls Interventions: Bed/chair exit alarm, Consult care management for discharge planning, Door open when patient unattended, Evaluate medications/consider consulting pharmacy
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
## 2017-09-15 NOTE — Progress Notes (Signed)
Problem: Falls - Risk of  Goal: *Absence of Falls  Document [name] Fall Risk and appropriate interventions in the flowsheet.  Outcome: Progressing Towards Goal  Fall Risk Interventions:  Mobility Interventions: Patient to call before getting OOB    Mentation Interventions: Bed/chair exit alarm, Family/sitter at bedside    Medication Interventions: Patient to call before getting OOB    Elimination Interventions: Bed/chair exit alarm    History of Falls Interventions: Bed/chair exit alarm, Consult care management for discharge planning, Door open when patient unattended, Evaluate medications/consider consulting pharmacy

## 2017-09-15 NOTE — Progress Notes (Signed)
Problem: Falls - Risk of  Goal: *Absence of Falls  Document [name] Fall Risk and appropriate interventions in the flowsheet.  Outcome: Progressing Towards Goal  Fall Risk Interventions:  Mobility Interventions: Bed/chair exit alarm, Patient to call before getting OOB    Mentation Interventions: Bed/chair exit alarm, More frequent rounding    Medication Interventions: Bed/chair exit alarm, Patient to call before getting OOB    Elimination Interventions: Bed/chair exit alarm, Call light in reach, Patient to call for help with toileting needs    History of Falls Interventions: Bed/chair exit alarm, Consult care management for discharge planning, Door open when patient unattended, Evaluate medications/consider consulting pharmacy

## 2017-09-15 NOTE — Progress Notes (Signed)
Hospitalist Progress Note  NAME: Meghan [name]   DOB:  09/08/1945   MRN:  [id number]       Assessment / Plan:  Acute encephalopathy in the setting of possible seizure vs acute CVA  Hx of post traumatic epilepsy  -EEG obtained -- abnormal with moderate diffuse cerebral dysfunction but no active seizures at time of EEG  -MRI brain with chronic changes but no acute abnormalities  -Echo still pending -- have been in touch with staff  -TSH within normal limits, lipids with LDL above goal (on statin), A1c in normal range, ammonia negative, UDS negative  -seizure precautions  -cont antiepileptics -- doses amended per neurology  -Recommended for return to home on hospital discharge  ??  Abnormal UA  -Urine growing Proteus, pansensitive, on ceftriaxone and afebrile.  ??  HTN  -Fairly well controlled at this time  ??  Hx psychiatric illness and TBI??  -hold aricept for now  ??  Code Status: Full  DVT Prophylaxis: lovenox  GI Prophylaxis: not indicated  Disposition: await echo result, plan for home on discharge. I made telephone calls today to documented telephone numbers of both of patient's sisters, Meghan [name] and Meghan [name], with no answer from either. Voice message left without any patient identifying or private information.     Subjective:     Chief Complaint / Reason for Physician Visit: follow up mental status change    Patient denies complaints but had difficulty participating in interview. Discussed with RN events overnight.     Review of Systems:  Symptom Y/N Comments  Symptom Y/N Comments   Fever/Chills    Chest Pain     Poor Appetite    Edema     Cough    Abdominal Pain     Sputum    Joint Pain     SOB/DOE    Pruritis/Rash     Nausea/vomit    Tolerating PT/OT     Diarrhea    Tolerating Diet     Constipation    Other       Could NOT obtain due to: Unreliable responses, mostly repeats after interviewer     Objective:     VITALS:   Last 24hrs VS reviewed since prior progress note. Most recent are:   Patient Vitals for the past 24 hrs:   Temp Pulse Resp BP SpO2   09/15/17 1152 98.8 ??F (37.1 ??C) 77 18 112/63 97 %   09/15/17 0956 ??? ??? ??? 99/56 ???   09/15/17 0741 98.4 ??F (36.9 ??C) 74 18 108/68 98 %   09/15/17 0304 96.8 ??F (36 ??C) 69 18 102/62 98 %   09/14/17 2318 98.4 ??F (36.9 ??C) ??? 18 93/56 96 %   09/14/17 1953 98.3 ??F (36.8 ??C) 80 18 104/64 98 %   09/14/17 1755 ??? ??? ??? 108/69 ???   09/14/17 1552 98.8 ??F (37.1 ??C) 77 20 90/53 99 %       Intake/Output Summary (Last 24 hours) at 09/15/2017 1524  Last data filed at 09/15/2017 1336  Gross per 24 hour   Intake 480 ml   Output ???   Net 480 ml        PHYSICAL EXAM:  General: WD, WN. Alert, cooperative, no acute distress????  EENT:  EOMI. Anicteric sclerae. MMM  Resp:  CTA bilaterally, no wheezing or rales.  No accessory muscle use  CV:  Regular  rhythm,?? No edema  GI:  Soft, Non distended, Non tender. ??+Bowel sounds  Neurologic:?? Alert,  hard of hearing, frequently comes up with nonsensical answers to questions and then repeats those answers. Follows commands.  Psych:???? Good insight.??Not anxious nor agitated  Skin:  No rashes.  No jaundice    Reviewed most current lab test results and cultures  YES  Reviewed most current radiology test results   YES  Review and summation of old records today    NO  Reviewed patient's current orders and MAR    YES  PMH/SH reviewed - no change compared to H&P  ________________________________________________________________________  Care Plan discussed with:    Comments   Patient y    [facility]                       y Multidiciplinary team rounds were held today with case manager, nursing, pharmacist and Higher education careers adviser.  Patient's plan of care was discussed; medications were reviewed and discharge planning was addressed.     ________________________________________________________________________  Total NON critical care TIME:  15   Minutes    Total CRITICAL CARE TIME Spent:   Minutes non procedure based       Comments   >50% of visit spent in counseling and coordination of care     ________________________________________________________________________  Margie Ege, MD     Procedures: see electronic medical records for all procedures/Xrays and details which were not copied into this note but were reviewed prior to creation of Plan.      LABS:  I reviewed today's most current labs and imaging studies.  Pertinent labs include:  Recent Labs     09/13/17  0309   WBC 6.6   HGB 11.8   HCT 36.9   PLT 392     Recent Labs     09/13/17  0309   NA 140   K 3.5   CL 107   CO2 25   GLU 62*   BUN 8   CREA 0.91   CA 9.1       Signed: Margie Ege, MD

## 2017-09-15 NOTE — Progress Notes (Signed)
Notified Dr. Cindee SaltLanagan of pt's low BP and that Echo cannot be done until tomorrow am. I will re- enter the order that was ordered 1/30//2019.
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
## 2017-09-16 ENCOUNTER — Inpatient Hospital Stay: Admit: 2017-09-16 | Payer: MEDICARE | Primary: Internal Medicine

## 2017-09-16 MED ORDER — DIVALPROEX 500 MG TAB, DELAYED RELEASE
500 mg | ORAL_TABLET | Freq: Two times a day (BID) | ORAL | 0 refills | Status: DC
Start: 2017-09-16 — End: 2017-11-02

## 2017-09-16 MED ORDER — DIVALPROEX 500 MG TAB, DELAYED RELEASE
500 mg | ORAL_TABLET | Freq: Two times a day (BID) | ORAL | 0 refills | Status: DC
Start: 2017-09-16 — End: 2017-09-16

## 2017-09-16 MED ORDER — LACOSAMIDE 100 MG TAB
100 mg | ORAL_TABLET | Freq: Two times a day (BID) | ORAL | 0 refills | Status: DC
Start: 2017-09-16 — End: 2017-09-16

## 2017-09-16 MED ORDER — LACOSAMIDE 100 MG TAB
100 mg | ORAL_TABLET | Freq: Two times a day (BID) | ORAL | 0 refills | Status: DC
Start: 2017-09-16 — End: 2017-09-21

## 2017-09-16 MED FILL — CHILDREN'S ASPIRIN 81 MG CHEWABLE TABLET: 81 mg | ORAL | Qty: 1

## 2017-09-16 MED FILL — TOPIRAMATE 25 MG TAB: 25 mg | ORAL | Qty: 2

## 2017-09-16 MED FILL — ATORVASTATIN 40 MG TAB: 40 mg | ORAL | Qty: 1

## 2017-09-16 MED FILL — GABAPENTIN 100 MG CAP: 100 mg | ORAL | Qty: 1

## 2017-09-16 MED FILL — DEPAKOTE 250 MG TABLET,DELAYED RELEASE: 250 mg | ORAL | Qty: 2

## 2017-09-16 MED FILL — CITALOPRAM 20 MG TAB: 20 mg | ORAL | Qty: 1

## 2017-09-16 MED FILL — VIMPAT 100 MG TABLET: 100 mg | ORAL | Qty: 1

## 2017-09-16 MED FILL — LACTULOSE 20 GRAM/30 ML ORAL SOLUTION: 20 gram/30 mL | ORAL | Qty: 30

## 2017-09-16 MED FILL — BD POSIFLUSH NORMAL SALINE 0.9 % INJECTION SYRINGE: INTRAMUSCULAR | Qty: 10

## 2017-09-16 MED FILL — CEFTRIAXONE 1 GRAM SOLUTION FOR INJECTION: 1 gram | INTRAMUSCULAR | Qty: 1

## 2017-09-16 MED FILL — LISINOPRIL 5 MG TAB: 5 mg | ORAL | Qty: 2

## 2017-09-16 MED FILL — ENOXAPARIN 40 MG/0.4 ML SUB-Q SYRINGE: 40 mg/0.4 mL | SUBCUTANEOUS | Qty: 0.4

## 2017-09-16 NOTE — Other (Signed)
Utilization Reviews        Neurology [place] - Care Day 4 (09/15/2017) by Juanito Doom, RN        Review Status Review Entered   Completed 09/17/2017 09:32      Criteria Review      Care Day: 4 Care Date: 09/15/2017 Level of Care: Telemetry   Guideline Day 3    Level Of Care   ( ) * Activity level acceptable   ( ) * Complete discharge planning      Clinical Status   ( ) * No infection, or status acceptable   ( ) * Isolation not needed, or status acceptable   (X) * Respiratory status acceptable   09/17/2017 9:29 AM EST by Patricia Pesa   ?? RR 18      ( ) * Pain and nausea absent or adequately managed   ( ) * Ventilatory status acceptable   ( ) * Neurologic problems absent or stabilized   ( ) * Muscle or nerve damage absent or stable   ( ) * General Discharge Criteria met      Interventions   ( ) * Intake acceptable   ( ) * No inpatient interventions needed      09/17/2017 9:32 AM EST by Patricia Pesa   Subject: Additional Clinical Information      MEDS:      cefTRIAXone (ROCEPHIN) 1 g in 0.9% sodium chloride (MBP/ADV) 50 mL Dose: 1 gFreq: EVERY 24 HOURS Route: IV      aspirin chewable tablet 81 mg Dose: 81 mgFreq: DAILY Route: PO      citalopram (CELEXA) tablet 20 mg Dose: 20 mgFreq: DAILY Route: PO      atorvastatin (LIPITOR) tablet 40 mg Dose: 40 mgFreq: EVERY BEDTIME Route: PO      divalproex DR (DEPAKOTE) tablet 500 mg Dose: 500 mgFreq: 2 TIMES DAILY Route: PO      gabapentin (NEURONTIN) capsule 100 mg Dose: 100 mgFreq: 3 TIMES DAILY Route: PO      lacosamide (VIMPAT) tablet 100 mg Dose: 100 mgFreq: 2 TIMES DAILY Route: PO      lactulose (CHRONULAC) solution 3.33 g Dose: 5 mLFreq: 3 TIMES DAILY Route: PO      topiramate (TOPAMAX) tablet 50 mg Dose: 50 mgFreq: 2 TIMES DAILY Route: PO         09/17/2017 9:29 AM EST by Patricia Pesa   Subject: Additional Clinical Information      VS: ?? 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      Abnormal ??UA      -Urine growing Proteus, pansensitive, on  ceftriaxone and afebrile. ??      HTN      -Fairly well controlled at this time ??      Hx psychiatric illness and TBI ??      -hold aricept for now ??      Code Status: ??Full      DVT Prophylaxis: ??lovenox      GI Prophylaxis: not indicated      Disposition: await echo result, plan for home on discharge. I made telephone calls today to documented telephone numbers of both of patient's sisters, Elvin So and Chrys Racer, with no answer from either. Voice message left without any patient identifying or private information. ??                  * Milestone         Neurology [place] - Care Day 3 (09/14/2017) by Juanito Doom, RN        Review Status Review Entered   Completed 09/17/2017 09:27      Criteria Review      Care Day: 3 Care Date: 09/14/2017 Level of Care: Telemetry   Guideline Day 3    Level Of Care   ( ) * Activity level acceptable   ( ) * Complete discharge planning      Clinical Status   ( ) * No infection, or status acceptable   ( ) * Isolation not needed, or status acceptable   (X) * Respiratory status acceptable   09/17/2017 9:27 AM EST by Patricia Pesa   ?? R 20      ( ) * Pain and nausea absent or adequately managed   ( ) * Ventilatory status acceptable   ( ) * Neurologic problems absent or stabilized   ( ) * Muscle or nerve damage absent or stable    ( ) * General Discharge Criteria met      Interventions   ( ) * Intake acceptable   ( ) * No inpatient interventions needed      09/17/2017 9:27 AM EST by Patricia Pesa   Subject: Additional Clinical Information      VS: ?? 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      Abnormal ??UA      -Urine growing Proteus, pansensitive ??      HTN      -Fairly well controlled at this time ??      Hx psychiatric illness and TBI ??      -hold aricept for now ??  09/17/2017 9:25 AM EST by Patricia Pesa   Subject: Additional Clinical Information      MEDS:      ????      divalproex DR (DEPAKOTE) tablet 500 mg 500 mgOralBID      lacosamide (VIMPAT) tablet 100 mg 100 mgOralBID      sodium chloride (NS) flush 5-40 mL 5-40 mLIntraVENousQ8H      aspirin chewable tablet 81 mg 81 mgOralDAILY      atorvastatin (LIPITOR) tablet 40 mg 40 mgOralQHS      enoxaparin (LOVENOX) injection 40 mg 40 mgSubCUTAneousQ24H      sodium chloride (NS) flush 5-40 mL 5-40 mLIntraVENousQ8H      citalopram (CELEXA) tablet 20 mg 20 mgOralDAILY      gabapentin (NEURONTIN) capsule 100 mg 100 mgOralTID      lisinopril (PRINIVIL, ZESTRIL) tablet 10 mg 10 mgOralDAILY      lactulose (CHRONULAC) solution 3.33 g 5 mLOralTID       topiramate (TOPAMAX) tablet 50 mg 50 mgOralBID      cefTRIAXone (ROCEPHIN) 1 g in 0.9% sodium chloride (MBP/ADV) 50 mL 1 gIntraVENousQ24H         09/17/2017 9:24 AM EST by Patricia Pesa   Subject: Additional Clinical Information      NEUROLOGY PROGRESS NOTE:      IMPRESSION:Meghan [name] is a 72 y.o. female who presents with alt mental status. 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 AAO X 2 on exam. Her AEDs have been adjusted. She repeats herself but otherwise has been able to follow commands. ?postictal state. Continue supportive care. No ongoing seizures on EEG.      RECOMMENDATIONS:      - Cont depakote to 500 mg po bid      - Cont vimpat to 100 mg po bid      - Cont topamax 50 mg pobid      - EEG shows slowing and no ongoing seizures      - MRI brain w/o C - Normal      - PT/OT/ST      - Abx as per primary team      - Supportive care                  * Milestone
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
## 2017-09-16 NOTE — Discharge Summary (Signed)
Hospitalist Discharge Summary     Patient ID:  Meghan [name]  [id number]  72 y.o.  1945/11/07    PCP on record: Gwynn BurlySeabrook, Berna A, NP    Admit date: 09/12/2017  Discharge date and time: 09/16/2017      DISCHARGE DIAGNOSIS:  Possible post-ictal state  Seizure disorder  Metabolic encephalopathy due to urinary tract infection  Urinary tract infection  Hypertension  Mild cognitive impairment  Diabetes mellitus  Post-traumatic brain syndrome    CONSULTATIONS:  IP CONSULT TO NEUROLOGY    Excerpted HPI from H&P of Meghan RadonUyen [name]:  Meghan [name] is a 72 y.o.  African American female with PMHx significant for seizure disorder, hx of TBI, HTN, present to the ED for evaluation of acute onset of confusion, started yesterday.  Pt was unable to provide meaningful hx due to acute illness.  I spoke to pt's sister via phone and as per pt's sister, pt [place] SN[place] and was doing well, at her baseline, able to perform ALDs, mild cognitive impairment, reports compliance with medications.  Yesterday, pt's sisters noted pt's speech was abnormal to include difficulty with speech findings, difficulty with walking and ressing herself.  Denies fever, chills at home.  During my encounter, pt was confused, pulling at her IVs, required sitter at bedside.  She was repeating my questions and answering yes and no to every questions.  Patient was able to follow some commands.  She does not appear to be in distress.  In the ER, CVA work up to include:  CT head, CTA head and neck, CXR all neg for acute process.  UA with + leukocytes esterase but neg for bacteria.  Labs reviewed.  We were asked to admit for work up and evaluation of the above problems  ______________________________________________________________________  DISCHARGE SUMMARY/HOSPITAL COURSE:  for full details see H&P, daily progress notes, labs, consult notes.   Possible post-ictal state: patient seen in consultation by Dr. [name] of  neurology. Antiepileptic medication dosages were modified to prevent further seizures.    Metabolic encephalopathy due to UTI: treated with ceftriaxone with good improvement. Unclear to what extent a possible post-ictal state was contributing to her presentation as well. EEG demonstrated moderate diffuse cerebral dysfunction but no active seizures at time of EEG. MRI brain with chronic changes but no acute abnormalities.    Proteus urinary tract infection:   Responded well to empiric ceftriaxone.  ??  HTN:  Well controlled and no modifications to home medication regimen were made.  ??  Hx psychiatric illness and TBI: no changes made during this hospitalization.  ??  _______________________________________________________________________  Patient seen and examined by me on discharge day.  Pertinent Findings:  Gen:    Not in distress, nontoxic  Chest: Clear lungs  CVS:   Regular rhythm.  No edema  Abd:  Soft, not distended, not tender  Neuro:  Alert, oriented to self. Frequently repeats herself and/or repeats questions asked by interviewer. Follows commands and moves all extremities.  _______________________________________________________________________  DISCHARGE MEDICATIONS:   Discharge Medication List as of 09/16/2017  4:29 PM      CONTINUE these medications which have CHANGED    Details   divalproex DR (DEPAKOTE) 500 mg tablet Take 1 Tab by mouth two (2) times a day., Normal, Disp-60 Tab, R-0      lacosamide (VIMPAT) 100 mg tab tablet Take 1 Tab by mouth two (2) times a day. Max Daily Amount: 200 mg., Print, Disp-60 Tab, R-0  CONTINUE these medications which have NOT CHANGED    Details   fenofibrate (LOFIBRA) 54 mg tablet Take 54 mg by mouth nightly., Historical Med      topiramate (TOPAMAX) 50 mg tablet Take 1 Tab by mouth two (2) times a day., Normal, Disp-60 Tab, R-2      lisinopril (PRINIVIL, ZESTRIL) 10 mg tablet Take 1 Tab by mouth daily., Normal, Disp-30 Tab, R-6       gabapentin (NEURONTIN) 100 mg capsule take 1 capsule by mouth three times a day, Normal, Disp-90 Cap, R-0      citalopram (CELEXA) 20 mg tablet Take 1 Tab by mouth daily., Normal, Disp-30 Tab, R-3      cholecalciferol (VITAMIN D3) 1,000 unit cap take 1 capsule by mouth once daily, Normal, Disp-30 Cap, R-3      fluticasone (FLONASE) 50 mcg/actuation nasal spray 1 Spray by Both Nostrils route daily., Normal, Disp-1 Bottle, R-1      donepezil (ARICEPT) 10 mg tablet Take 1 Tab by mouth nightly., Normal, Disp-30 Tab, R-1      lactulose (ENULOSE) 10 gram/15 mL solution Take 1 tsp by mouth three (3) times daily., Historical Med      SUMAtriptan (IMITREX) 100 mg tablet Take 100 mg by mouth once as needed for Migraine. May repeat 1 tab in 2 hours, limit: 2 tabs in 24 hours, not more than 2 days a week., Historical Med      methylphenidate HCl (RITALIN) 5 mg tablet Take 1 Tab (5 mg total) by mouth dailyEarliest Fill Date: 05/11/17.  Max Daily Amount: 5 mg, Print, Disp-30 Tab, R-0      aspirin 81 mg chewable tablet Take 81 mg by mouth daily., Historical Med             My Recommended Diet, Activity, Wound Care, and follow-up labs are listed in the patient's Discharge Insturctions which I have personally completed and reviewed.    ______________________________________________________________________    Risk of deterioration: Low    Condition at Discharge:  Stable  ______________________________________________________________________    Disposition  Home with family, no needs  ______________________________________________________________________    Care Plan discussed with:   Patient, Family, RN    ______________________________________________________________________    Code Status: Full Code  ______________________________________________________________________      Follow up with:   PCP : Gwynn Burly, NP  Follow-up Information     Follow up With Specialties Details Why Contact Info     Gwynn Burly, NP Nurse Practitioner Schedule an appointment as soon as possible for a visit in 1 week Hospitalization follow up [address] [facility] [place] [address]  [phone]                Total time in minutes spent coordinating this discharge (includes going over instructions, follow-up, prescriptions, and preparing report for sign off to her PCP) :  45 minutes    Signed:  Margie Ege, MD

## 2017-09-16 NOTE — Discharge Summary (Signed)
Discharge Summary by Margie Ege, MD at 09/16/17 1450                Author: Margie Ege, MD  Service: Internal Medicine  Author Type: Physician       Filed: 09/19/17 1917  Date of Service: 09/16/17 1450  Status: Signed          Editor: Margie Ege, MD (Physician)                                       Hospitalist Discharge Summary        Patient ID:   Meghan [name]   [id number]   72 y.o.   [birth year]      PCP on record: Gwynn Burly, NP      Admit date: 09/12/2017   Discharge date and time: 09/16/2017         DISCHARGE DIAGNOSIS:   Possible post-ictal state   Seizure disorder   Metabolic encephalopathy due to urinary tract infection   Urinary tract infection   Hypertension   Mild cognitive impairment   Diabetes mellitus   Post-traumatic brain syndrome      CONSULTATIONS:   IP CONSULT TO NEUROLOGY      Excerpted HPI from H&P of Meghan [name]:   Tacie Mccuistion is a 72 y.o.  African American female with PMHx significant for seizure disorder, hx of TBI, HTN, present to the ED for evaluation of acute onset  of confusion, started yesterday.  Pt was unable to provide meaningful hx due to acute illness.  I spoke to pt's sister via phone and as per pt's sister, pt returned to home from SNF around [place] and was doing well, at her baseline, able to perform ALDs,  mild cognitive impairment, reports compliance with medications.  Yesterday, pt's sisters noted pt's speech was abnormal to include difficulty with speech findings, difficulty with walking and ressing herself.  Denies fever, chills at home.  During  my encounter, pt was confused, pulling at her IVs, required sitter at bedside.  She was repeating my questions and answering yes and no to every questions.  Patient was able to follow some commands.  She does not appear to be in distress.   In the ER, CVA work up to include:  CT head, CTA head and neck, CXR all neg for acute process.  UA with + leukocytes esterase but neg for bacteria.   Labs  reviewed.   We were asked to admit for work up and evaluation of the above problems   ______________________________________________________________________   DISCHARGE SUMMARY/HOSPITAL COURSE:   for full details see H&P, daily progress notes, labs, consult notes.       Possible post-ictal state: patient seen in consultation by Dr. Sallee Lange of neurology. Antiepileptic medication  dosages were modified to prevent further seizures.      Metabolic encephalopathy due to UTI: treated with ceftriaxone with good improvement. Unclear to what extent a possible post-ictal state was contributing to her presentation as well.  EEG demonstrated moderate diffuse cerebral dysfunction but no active seizures at time of EEG. MRI brain with chronic changes but no acute abnormalities.      Proteus urinary tract infection:    Responded well to empiric ceftriaxone.   ??   HTN:   Well controlled and no modifications to home medication regimen were made.   ??   Hx psychiatric  illness and TBI: no changes made during this hospitalization.   ??   _______________________________________________________________________   Patient seen and examined by me on discharge day.   Pertinent Findings:   Gen:    Not in distress, nontoxic   Chest: Clear lungs   CVS:   Regular rhythm.  No edema   Abd:  Soft, not distended, not tender   Neuro:  Alert, oriented to self. Frequently repeats herself and/or repeats questions asked by interviewer. Follows commands and moves all extremities.   _______________________________________________________________________   DISCHARGE MEDICATIONS:      Discharge Medication List as of 09/16/2017  4:29 PM              CONTINUE these medications which have CHANGED          Details        divalproex DR (DEPAKOTE) 500 mg tablet  Take 1 Tab by mouth two (2) times a day., Normal, Disp-60 Tab, R-0               lacosamide (VIMPAT) 100 mg tab tablet  Take 1 Tab by mouth two (2) times a day. Max Daily Amount: 200 mg., Print, Disp-60 Tab, R-0                      CONTINUE these medications which have NOT CHANGED          Details        fenofibrate (LOFIBRA) 54 mg tablet  Take 54 mg by mouth nightly., Historical Med               topiramate (TOPAMAX) 50 mg tablet  Take 1 Tab by mouth two (2) times a day., Normal, Disp-60 Tab, R-2               lisinopril (PRINIVIL, ZESTRIL) 10 mg tablet  Take 1 Tab by mouth daily., Normal, Disp-30 Tab, R-6               gabapentin (NEURONTIN) 100 mg capsule  take 1 capsule by mouth three times a day, Normal, Disp-90 Cap, R-0               citalopram (CELEXA) 20 mg tablet  Take 1 Tab by mouth daily., Normal, Disp-30 Tab, R-3               cholecalciferol (VITAMIN D3) 1,000 unit cap  take 1 capsule by mouth once daily, Normal, Disp-30 Cap, R-3               fluticasone (FLONASE) 50 mcg/actuation nasal spray  1 Spray by Both Nostrils route daily., Normal, Disp-1 Bottle, R-1               donepezil (ARICEPT) 10 mg tablet  Take 1 Tab by mouth nightly., Normal, Disp-30 Tab, R-1               lactulose (ENULOSE) 10 gram/15 mL solution  Take 1 tsp by mouth three (3) times daily., Historical Med               SUMAtriptan (IMITREX) 100 mg tablet  Take 100 mg by mouth once as needed for Migraine. May repeat 1 tab in 2 hours, limit: 2 tabs in 24 hours, not more than 2 days a week., Historical Med               methylphenidate HCl (RITALIN) 5 mg tablet  Take 1 Tab (5 mg total) by mouth dailyEarliest  Fill Date: 05/11/17.  Max Daily Amount: 5 mg, Print, Disp-30 Tab, R-0               aspirin 81 mg chewable tablet  Take 81 mg by mouth daily., Historical Med                         My Recommended Diet, Activity, Wound Care, and follow-up labs are listed in the patient's Discharge Insturctions which I have personally completed and reviewed.      ______________________________________________________________________      Risk of deterioration:  Low      Condition at Discharge:  Stable    ______________________________________________________________________      Disposition   Home with family, no needs   ______________________________________________________________________      Care Plan discussed with:    Patient, Family, RN      ______________________________________________________________________      Code Status: Full Code   ______________________________________________________________________         Follow up with:    PCP : Gwynn BurlySeabrook, Berna A, NP     Follow-up Information               Follow up With  Specialties  Details  Why  Contact Info              Gwynn BurlySeabrook, Berna A, NP  Nurse Practitioner  Schedule an appointment as soon as possible for a visit in 1 week  [address]ospital[facility] follow [address]  [address]    [place] [id number]   [phone]                         Total time in minutes spent coordinating this discharge (includes going over instructions, follow-up, prescriptions, and preparing report for sign off to her PCP) :  45 minutes      Signed:   Margie EgeKerry C Jenasis Straley, MD

## 2017-09-17 ENCOUNTER — Encounter: Primary: Internal Medicine

## 2017-09-17 LAB — ECHO ADULT COMPLETE
Aortic Root: 3.01 cm
LV E' Lateral Velocity: 13.05 cm/s
LV E' Septal Velocity: 9.81 cm/s
PASP: 30 mmHg

## 2017-09-17 LAB — TRANSTHORACIC ECHOCARDIOGRAM (TTE) COMPLETE (CONTRAST/BUBBLE/3D PRN)
Aortic Root: 3.01 cm
LV E' Lateral Velocity: 13.05 cm/s
LV E' Septal Velocity: 9.81 cm/s
Left Ventricular Ejection Fraction: 58
PASP: 30 mmHg

## 2017-09-17 NOTE — Progress Notes (Signed)
Hospital Discharge Follow-Up      Date/Time:  09/17/2017 4:01 PM    Patient was admitted to [facility] on 1/30 and discharged on 2/3 for Encephalopathy. The physician discharge summary was available at the time of outreach.  Patient's sisters were contacted within 1 business days of discharge.      Top Challenges reviewed with the provider   Family would like to have a personal care aide- denies a need for home health PT/OT/Speech         Method of communication with provider :face to face    Inpatient RRAT score: 27  Was this a readmission? no   Patient stated reason for the readmission: n/a    Nurse Navigator (NN) contacted the family by telephone to perform post hospital discharge assessment. Verified name and DOB with family as identifiers. Provided introduction to self, and explanation of the Nurse Navigator role.     Reviewed discharge instructions and red flags with family who verbalized understanding. Family given an opportunity to ask questions and does not have any further questions or concerns at this time. The family agrees to contact the PCP office for questions related to their healthcare. NN provided contact information for future reference.    Disease Specific:   [name]    Summary of patient's top problems:  1. Encephalopathy- confusion at baseline, medications adjustments, hx of seizures, two admissions for the same in last 4 mo's   2. Fall- decreased balance, does own adl's although not stable, impulsive at times/decreased safety awareness, 03/2017 admission for fall.   3.     Home Health orders at discharge: none  Home Health company: n/a  Date of initial visit: n/a    Durable Medical Equipment ordered/company: n/a  Durable Medical Equipment received: n/a    Barriers to care? Confusion at baseline    Advance Care Planning:   Does patient have an Advance Directive:  not on file; education provided     Medication(s):   New Medications at Discharge: n/a   Changed Medications at Discharge:   Depakote 500 mg BID  Vimpat 100 mg BID  Fenofibrate 54 mg at night  Discontinued Medications at Discharge: n/a    Medication reconciliation was not performed with family, as the sister Veva Holes who fills pt's pill box is not with the pt or medication at this time and her other sister Eber Jones that she's living with isn't familiar with pt's medications. They have agreed to bring medications to TOC. There were no barriers to obtaining medications identified at this time.    Referral to Pharm D needed: no     Current Outpatient Medications   Medication Sig   ??? divalproex DR (DEPAKOTE) 500 mg tablet Take 1 Tab by mouth two (2) times a day.   ??? lacosamide (VIMPAT) 100 mg tab tablet Take 1 Tab by mouth two (2) times a day. Max Daily Amount: 200 mg.   ??? fenofibrate (LOFIBRA) 54 mg tablet Take 54 mg by mouth nightly.   ??? topiramate (TOPAMAX) 50 mg tablet Take 1 Tab by mouth two (2) times a day.   ??? lisinopril (PRINIVIL, ZESTRIL) 10 mg tablet Take 1 Tab by mouth daily.   ??? gabapentin (NEURONTIN) 100 mg capsule take 1 capsule by mouth three times a day   ??? citalopram (CELEXA) 20 mg tablet Take 1 Tab by mouth daily.   ??? cholecalciferol (VITAMIN D3) 1,000 unit cap take 1 capsule by mouth once daily   ??? fluticasone (  FLONASE) 50 mcg/actuation nasal spray 1 Spray by Both Nostrils route daily.   ??? donepezil (ARICEPT) 10 mg tablet Take 1 Tab by mouth nightly.   ??? lactulose (ENULOSE) 10 gram/15 mL solution Take 1 tsp by mouth three (3) times daily.   ??? SUMAtriptan (IMITREX) 100 mg tablet Take 100 mg by mouth once as needed for Migraine. May repeat 1 tab in 2 hours, limit: 2 tabs in 24 hours, not more than 2 days a week.   ??? methylphenidate HCl (RITALIN) 5 mg tablet Take 1 Tab (5 mg total) by mouth dailyEarliest Fill Date: 05/11/17.  Max Daily Amount: 5 mg   ??? aspirin 81 mg chewable tablet Take 81 mg by mouth daily.     No current facility-administered medications for this visit.         There are no discontinued medications.    [facility] follow up appointment(s):   Future Appointments   Date Time Provider Department Center   09/21/2017  9:30 AM [facility]wynn BurlySeabrook[facility]   10/11/2017  9:15 AM Gwynn BurlySeabrook[facility] A[facility]   10/12/2017  2:40 PM Aralu, Cletus C, MD [facility] SCHED      Non-[facility] follow up appointment(s): n/a  [facility]:  previously provided to pt's family       Goals     None

## 2017-09-18 LAB — CULTURE, BLOOD
Culture result:: NO GROWTH
Culture result:: NO GROWTH

## 2017-09-21 ENCOUNTER — Ambulatory Visit
Admit: 2017-09-21 | Discharge: 2017-09-21 | Payer: PRIVATE HEALTH INSURANCE | Attending: Nurse Practitioner | Primary: Internal Medicine

## 2017-09-21 DIAGNOSIS — G40909 Epilepsy, unspecified, not intractable, without status epilepticus: Secondary | ICD-10-CM

## 2017-09-21 MED ORDER — LACOSAMIDE 100 MG TAB
100 mg | ORAL_TABLET | Freq: Two times a day (BID) | ORAL | 0 refills | Status: DC
Start: 2017-09-21 — End: 2017-11-12
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
## 2017-09-21 NOTE — Progress Notes (Signed)
Chief Complaint   Patient presents with   ??? Hospital Follow Up   ??? Memory Loss     1. Have you been to the ER, urgent care clinic since your last visit?  Hospitalized since your last visit?No    2. Have you seen or consulted any other health care providers [facility] since your last visit?  Include any pap smears or colon screening. No

## 2017-09-21 NOTE — Patient Instructions (Addendum)
High Blood Pressure: Care Instructions  Overview    It's normal for blood pressure to go up and down throughout the day. But if it stays up, you have high blood pressure. Another name for high blood pressure is hypertension.  Despite what a lot of people think, high blood pressure usually doesn't cause headaches or make you feel dizzy or lightheaded. It usually has no symptoms. But it does increase your risk of stroke, heart attack, and other problems. You and your doctor will talk about your risks of these problems based on your blood pressure.  Your doctor will give you a goal for your blood pressure. Your goal will be based on your health and your age.  Lifestyle changes, such as eating healthy and being active, are always important to help lower blood pressure. You might also take medicine to reach your blood pressure goal.  Follow-up care is a key part of your treatment and safety. Be sure to make and go to all appointments, and call your doctor if you are having problems. It's also a good idea to know your test results and keep a list of the medicines you take.  How can you care for yourself at home?  Medical treatment  ?? If you stop taking your medicine, your blood pressure will go back up. You may take one or more types of medicine to lower your blood pressure. Be safe with medicines. Take your medicine exactly as prescribed. Call your doctor if you think you are having a problem with your medicine.  ?? Talk to your doctor before you start taking aspirin every day. Aspirin can help certain people lower their risk of a heart attack or stroke. But taking aspirin isn't right for everyone, because it can cause serious bleeding.  ?? See your doctor regularly. You may need to see the doctor more often at first or until your blood pressure comes down.  ?? If you are taking blood pressure medicine, talk to your doctor before you take decongestants or anti-inflammatory medicine, such as ibuprofen.  Some of these medicines can raise blood pressure.  ?? Learn how to check your blood pressure at home.  Lifestyle changes  ?? Stay at a healthy weight. This is especially important if you put on weight around the waist. Losing even 10 pounds can help you lower your blood pressure.  ?? If your doctor recommends it, get more exercise. Walking is a good choice. Bit by bit, increase the amount you walk every day. Try for at least 30 minutes on most days of the week. You also may want to swim, bike, or do other activities.  ?? Avoid or limit alcohol. Talk to your doctor about whether you can drink any alcohol.  ?? Try to limit how much sodium you eat to less than 2,300 milligrams (mg) a day. Your doctor may ask you to try to eat less than 1,500 mg a day.  ?? Eat plenty of fruits (such as bananas and oranges), vegetables, legumes, whole grains, and low-fat dairy products.  ?? Lower the amount of saturated fat in your diet. Saturated fat is found in animal products such as milk, cheese, and meat. Limiting these foods may help you lose weight and also lower your risk for heart disease.  ?? Do not smoke. Smoking increases your risk for heart attack and stroke. If you need help quitting, talk to your doctor about stop-smoking programs and medicines. These can increase your chances of quitting for good.  When   should you call for help?  Call 911 anytime you think you may need emergency care. This may mean having symptoms that suggest that your blood pressure is causing a serious heart or blood vessel problem. Your blood pressure may be over 180/120.  ??For example, call 911 if:  ?? ?? You have symptoms of a heart attack. These may include:  ? Chest pain or pressure, or a strange feeling in the chest.  ? Sweating.  ? Shortness of breath.  ? Nausea or vomiting.  ? Pain, pressure, or a strange feeling in the back, neck, jaw, or upper belly or in one or both shoulders or arms.  ? Lightheadedness or sudden weakness.   ? A fast or irregular heartbeat.   ?? ?? You have symptoms of a stroke. These may include:  ? Sudden numbness, tingling, weakness, or loss of movement in your face, arm, or leg, especially on only one side of your body.  ? Sudden vision changes.  ? Sudden trouble speaking.  ? Sudden confusion or trouble understanding simple statements.  ? Sudden problems with walking or balance.  ? A sudden, severe headache that is different from past headaches.   ?? ?? You have severe back or belly pain.   ??Do not wait until your blood pressure comes down on its own. Get help right away.  ??Call your doctor now or seek immediate care if:  ?? ?? Your blood pressure is much higher than normal (such as 180/120 or higher), but you don't have symptoms.   ?? ?? You think high blood pressure is causing symptoms, such as:  ? Severe headache.  ? Blurry vision.   ??Watch closely for changes in your health, and be sure to contact your doctor if:  ?? ?? Your blood pressure measures higher than your doctor recommends at least 2 times. That means the top number is higher or the bottom number is higher, or both.   ?? ?? You think you may be having side effects from your blood pressure medicine.   Where can you learn more?  Go to [employer].  Enter [phone] in the search box to learn more about "High Blood Pressure: Care Instructions."  Current as of: March 04, 2017  Content Version: 11.9  ?? 2006-2018 [employer]. Care instructions adapted under license by [employer] (which disclaims liability or warranty for this information). If you have questions about a medical condition or this instruction, always ask your healthcare professional. [employer] disclaims any warranty or liability for your use of this information.         Epilepsy: Care Instructions  Your Care Instructions    Epilepsy is a common condition that causes repeated seizures. The seizures  are caused by bursts of electrical activity in the brain that aren't normal. Seizures may cause problems with muscle control, movement, speech, vision, or awareness. They can be scary.  Epilepsy affects each person differently. Some people have only a few seizures. Others get them more often. If you know what triggers a seizure, you may be able to avoid having one.  You can take medicines to control and reduce seizures. You and your doctor will need to find the right combination, schedule, and dose of medicine. This may take time and careful changes.  Seizures may get worse and happen more often over time.  Follow-up care is a key part of your treatment and safety. Be sure to make and go to all appointments, and call your doctor if  you are having problems. It's also a good idea to know your test results and keep a list of the medicines you take.  How can you care for yourself at home?  ?? Be safe with medicines. Take your medicines exactly as prescribed. Call your doctor if you think you are having a problem with your medicine.  ?? Make a treatment plan with your doctor. Be sure to follow your plan.  ?? Try to identify and avoid things that may make you more likely to have a seizure. These may include:  ? Not getting enough sleep.  ? Using drugs or alcohol.  ? Being emotionally stressed.  ? Skipping meals.  ?? Keep a record of any seizures you have. Note the date, time of day, and any details about the seizure that you can remember. Your doctor can use this information to plan or adjust your medicine or other treatment.  ?? Be sure that any doctor treating you for another condition knows that you have epilepsy. Each doctor should know what medicines you are taking, if any.  ?? Wear a medical ID bracelet. You can buy this at most drugstores. If you have a seizure that leaves you unconscious or unable to speak for yourself, this bracelet will let those who are treating you know that you have epilepsy.   ?? Talk to your doctor about whether it is safe for you to do certain activities, such as drive or swim.  When should you call for help?  Call 911 anytime you think you may need emergency care. For example, call if:  ?? ?? A seizure does not stop as it normally does.   ?? ?? You have new symptoms such as:  ? Numbness, tingling, or weakness on one side of your body or face.  ? Vision changes.  ? Trouble speaking or thinking clearly.   ??Call your doctor now or seek immediate medical care if:  ?? ?? You have a fever.   ?? ?? You have a severe headache.   ??Watch closely for changes in your health, and be sure to contact your doctor if:  ?? ?? The normal pattern or features of your seizures change.   Where can you learn more?  Go to [employer]Stats[employer]cahttp://www.healthwise.net/[employer].  Enter (431)105-2908X141 in the search box to learn more about "Epilepsy: Care Instructions."  Current as of: January 14, 2017  Content Version: 11.9  ?? 2006-2018 [employer]. Care instructions adapted under license by [employer] (which disclaims liability or warranty for this information). If you have questions about a medical condition or this instruction, always ask your healthcare professional. [employer] disclaims any warranty or liability for your use of this information.         Seizure: Care Instructions  Your Care Instructions    Seizures are caused by abnormal patterns of electrical signals in the brain. They are different for each person.  Seizures can affect movement, speech, vision, or awareness. Some people have only slight shaking of a hand and do not pass out. Other people may pass out and have violent shaking of the whole body. Some people appear to stare into space. They are awake, but they can't respond normally. Later, they may not remember what happened.  You may need tests to identify the type and cause of the seizures.  A seizure may occur only once, or you may have them more than one time.  Taking medicines as directed and following up with your doctor may help  keep you from having more seizures.  The doctor has checked you carefully, but problems can develop later. If you notice any problems or new symptoms, get medical treatment right away.  Follow-up care is a key part of your treatment and safety. Be sure to make and go to all appointments, and call your doctor if you are having problems. It's also a good idea to know your test results and keep a list of the medicines you take.  How can you care for yourself at home?  ?? Be safe with medicines. Take your medicines exactly as prescribed. Call your doctor if you think you are having a problem with your medicine.  ?? Do not do any activity that could be dangerous to you or others until your doctor says it is safe to do so. For example, do not drive a car, operate machinery, swim, or climb ladders.  ?? Be sure that anyone treating you for any health problem knows that you have had a seizure and what medicines you are taking for it.  ?? Identify and avoid things that may make you more likely to have a seizure. These may include lack of sleep, alcohol or drug use, stress, or not eating.  ?? Make sure you go to your follow-up appointment.  When should you call for help?  Call 911 anytime you think you may need emergency care. For example, call if:  ?? ?? You have another seizure.   ?? ?? You have more than one seizure in 24 hours.   ?? ?? You have new symptoms, such as trouble walking, speaking, or thinking clearly.   ??Call your doctor now or seek immediate medical care if:  ?? ?? You are not acting normally.   ??Watch closely for changes in your health, and be sure to contact your doctor if you have any problems.  Where can you learn more?  Go to [employer].  Enter [phone] in the search box to learn more about "Seizure: Care Instructions."  Current as of: January 14, 2017  Content Version: 11.9   ?? 2006-2018 [employer]. Care instructions adapted under license by [employer] (which disclaims liability or warranty for this information). If you have questions about a medical condition or this instruction, always ask your healthcare professional. [employer] disclaims any warranty or liability for your use of this information.

## 2017-09-24 ENCOUNTER — Encounter: Primary: Internal Medicine

## 2017-09-27 NOTE — Progress Notes (Signed)
This Clinical research associatewriter reaches out to pt's sisters as a follow up to our last conversations. Today's calls go unanswered, lmom x2 with reason for the call and contact number. Pt is not at her home address thus get in touch letter not generated. This Clinical research associatewriter will attempt to reach pt's sisters in 1-3 bus days.

## 2017-10-01 ENCOUNTER — Encounter: Primary: Internal Medicine

## 2017-10-01 NOTE — Progress Notes (Signed)
This Clinical research associatewriter reaches out to Ms Vear Clockhillips sisters [name] and Eber JonesCarolyn for updates on pt's progress.     [name]: Pt's son isn't home yet, hopefully by end of the month. She has been in touch with pt's current rental agent and another across the street trying to get pt a first floor apartment in hopes of pt returning home once her son is back home. [name] has decided to take some time for herself thus not much contact with pt nor their sister Eber JonesCarolyn recently.     Carolyn: Pt is doing well. Denies any seizures and no falls. Pt is walking without any assistive device. There is no home health in place. Pt is eating well and blood glucose is controlled per Eber Jonesarolyn who is now checking pt's levels. Eber JonesCarolyn is now filling pt's pill box and ensuring she takes her medications daily. They are getting out more, just today with a neighbor they rode the bus across town to visit a brother. Pt no longer seems down/depressed she cheerful and likes to be on the go. Eber JonesCarolyn believes pt's son will be here this week.     This Clinical research associatewriter reminds both sisters of pt's next PCP ov- Eber JonesCarolyn reports will ensure Ms Vear Clockhillips attends. [facility] this writer has rescheduled pt's 3/1 neuro f/u to 10:40 so they will not have to rely on public transportation.

## 2017-10-08 ENCOUNTER — Encounter: Primary: Internal Medicine

## 2017-10-09 NOTE — Progress Notes (Signed)
This Clinical research associatewriter reaches out to pt's sisters for follow up to last conversation.   Carolyn: Pt is now staying with [name] after a disagreement post looking into a new residence for Ms Vear Clockhillips.     [name]: Pt is staying with her daughter but [name] is helping out a lot d/t daughters work hours. [name] believes she's found the pt a new ground level apartment. She will handling paper work tomorrow on Ms [employer] behalf. [name] confirms pt will attend her 2/28 w/ PCP. This Clinical research associatewriter advises a call was placed to pt's daughter introducing self. [name] advises this Clinical research associatewriter to call her d/t daughter working hours.     PCP will be updated

## 2017-10-11 ENCOUNTER — Ambulatory Visit
Admit: 2017-10-11 | Discharge: 2017-10-11 | Payer: PRIVATE HEALTH INSURANCE | Attending: Nurse Practitioner | Primary: Internal Medicine

## 2017-10-11 DIAGNOSIS — F09 Unspecified mental disorder due to known physiological condition: Secondary | ICD-10-CM
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
## 2017-10-11 NOTE — Addendum Note (Signed)
Addended by: Reece LeaderSEABROOK, Lyonel Morejon A on: 11/12/2017 07:47 AM     Modules accepted: Orders

## 2017-10-11 NOTE — Progress Notes (Signed)
Chief Complaint   Patient presents with   ??? Seizure     1. Have you been to the ER, urgent care clinic since your last visit?  Hospitalized since your last visit?No    2. Have you seen or consulted any other health care providers outside of the [facility] since your last visit?  Include any pap smears or colon screening. No

## 2017-10-11 NOTE — Patient Instructions (Addendum)
High Cholesterol: Care Instructions  Your Care Instructions    Cholesterol is a type of fat in your blood. It is needed for many body functions, such as making new cells. Cholesterol is made by your body. It also comes from food you eat. High cholesterol means that you have too much of the fat in your blood. This raises your risk of a heart attack and stroke.  LDL and HDL are part of your total cholesterol. LDL is the "bad" cholesterol. High LDL can raise your risk for heart disease, heart attack, and stroke. HDL is the "good" cholesterol. It helps clear bad cholesterol from the body. High HDL is linked with a lower risk of heart disease, heart attack, and stroke.  Your cholesterol levels help your doctor find out your risk for having a heart attack or stroke. You and your doctor can talk about whether you need to lower your risk and what treatment is best for you.  A heart-healthy lifestyle along with medicines can help lower your cholesterol and your risk. The way you choose to lower your risk will depend on how high your risk is for heart attack and stroke. It will also depend on how you feel about taking medicines.  Follow-up care is a key part of your treatment and safety. Be sure to make and go to all appointments, and call your doctor if you are having problems. It's also a good idea to know your test results and keep a list of the medicines you take.  How can you care for yourself at home?  ?? Eat a variety of foods every day. Good choices include fruits, vegetables, whole grains (like oatmeal), dried beans and peas, nuts and seeds, soy products (like tofu), and fat-free or low-fat dairy products.  ?? Replace butter, margarine, and hydrogenated or partially hydrogenated oils with olive and canola oils. (Canola oil margarine without trans fat is fine.)  ?? Replace red meat with fish, poultry, and soy protein (like tofu).  ?? Limit processed and packaged foods like chips, crackers, and cookies.   ?? Bake, broil, or steam foods. Don't fry them.  ?? Be physically active. Get at least 30 minutes of exercise on most days of the week. Walking is a good choice. You also may want to do other activities, such as running, swimming, cycling, or playing tennis or team sports.  ?? Stay at a healthy weight or lose weight by making the changes in eating and physical activity listed above. Losing just a small amount of weight, even 5 to 10 pounds, can reduce your risk for having a heart attack or stroke.  ?? Do not smoke.  When should you call for help?  Watch closely for changes in your health, and be sure to contact your doctor if:  ?? ?? You need help making lifestyle changes.   ?? ?? You have questions about your medicine.   Where can you learn more?  Go to http://www.healthwise.net/GoodHelpConnections.  Enter I865 in the search box to learn more about "High Cholesterol: Care Instructions."  Current as of: March 04, 2017  Content Version: 11.9  ?? 2006-2018 [employer]wise[employer]. Care instructions adapted under license by [employer] (which disclaims liability or warranty for this information). If you have questions about a medical condition or this instruction, always ask your healthcare professional. Healthwise, Incorporated disclaims any warranty or liability for your use of this information.         High Blood Pressure: Care Instructions  Overview      It's normal for blood pressure to go up and down throughout the day. But if it stays up, you have high blood pressure. Another name for high blood pressure is hypertension.  Despite what a lot of people think, high blood pressure usually doesn't cause headaches or make you feel dizzy or lightheaded. It usually has no symptoms. But it does increase your risk of stroke, heart attack, and other problems. You and your doctor will talk about your risks of these problems based on your blood pressure.   Your doctor will give you a goal for your blood pressure. Your goal will be based on your health and your age.  Lifestyle changes, such as eating healthy and being active, are always important to help lower blood pressure. You might also take medicine to reach your blood pressure goal.  Follow-up care is a key part of your treatment and safety. Be sure to make and go to all appointments, and call your doctor if you are having problems. It's also a good idea to know your test results and keep a list of the medicines you take.  How can you care for yourself at home?  Medical treatment  ?? If you stop taking your medicine, your blood pressure will go back up. You may take one or more types of medicine to lower your blood pressure. Be safe with medicines. Take your medicine exactly as prescribed. Call your doctor if you think you are having a problem with your medicine.  ?? Talk to your doctor before you start taking aspirin every day. Aspirin can help certain people lower their risk of a heart attack or stroke. But taking aspirin isn't right for everyone, because it can cause serious bleeding.  ?? See your doctor regularly. You may need to see the doctor more often at first or until your blood pressure comes down.  ?? If you are taking blood pressure medicine, talk to your doctor before you take decongestants or anti-inflammatory medicine, such as ibuprofen. Some of these medicines can raise blood pressure.  ?? Learn how to check your blood pressure at home.  Lifestyle changes  ?? Stay at a healthy weight. This is especially important if you put on weight around the waist. Losing even 10 pounds can help you lower your blood pressure.  ?? If your doctor recommends it, get more exercise. Walking is a good choice. Bit by bit, increase the amount you walk every day. Try for at least 30 minutes on most days of the week. You also may want to swim, bike, or do other activities.   ?? Avoid or limit alcohol. Talk to your doctor about whether you can drink any alcohol.  ?? Try to limit how much sodium you eat to less than 2,300 milligrams (mg) a day. Your doctor may ask you to try to eat less than 1,500 mg a day.  ?? Eat plenty of fruits (such as bananas and oranges), vegetables, legumes, whole grains, and low-fat dairy products.  ?? Lower the amount of saturated fat in your diet. Saturated fat is found in animal products such as milk, cheese, and meat. Limiting these foods may help you lose weight and also lower your risk for heart disease.  ?? Do not smoke. Smoking increases your risk for heart attack and stroke. If you need help quitting, talk to your doctor about stop-smoking programs and medicines. These can increase your chances of quitting for good.  When should you call for help?  Call 911 anytime   you think you may need emergency care. This may mean having symptoms that suggest that your blood pressure is causing a serious heart or blood vessel problem. Your blood pressure may be over 180/120.  ??For example, call 911 if:  ?? ?? You have symptoms of a heart attack. These may include:  ? Chest pain or pressure, or a strange feeling in the chest.  ? Sweating.  ? Shortness of breath.  ? Nausea or vomiting.  ? Pain, pressure, or a strange feeling in the back, neck, jaw, or upper belly or in one or both shoulders or arms.  ? Lightheadedness or sudden weakness.  ? A fast or irregular heartbeat.   ?? ?? You have symptoms of a stroke. These may include:  ? Sudden numbness, tingling, weakness, or loss of movement in your face, arm, or leg, especially on only one side of your body.  ? Sudden vision changes.  ? Sudden trouble speaking.  ? Sudden confusion or trouble understanding simple statements.  ? Sudden problems with walking or balance.  ? A sudden, severe headache that is different from past headaches.   ?? ?? You have severe back or belly pain.    ??Do not wait until your blood pressure comes down on its own. Get help right away.  ??Call your doctor now or seek immediate care if:  ?? ?? Your blood pressure is much higher than normal (such as 180/120 or higher), but you don't have symptoms.   ?? ?? You think high blood pressure is causing symptoms, such as:  ? Severe headache.  ? Blurry vision.   ??Watch closely for changes in your health, and be sure to contact your doctor if:  ?? ?? Your blood pressure measures higher than your doctor recommends at least 2 times. That means the top number is higher or the bottom number is higher, or both.   ?? ?? You think you may be having side effects from your blood pressure medicine.   Where can you learn more?  Go to [employer].  Enter [phone] in the search box to learn more about "High Blood Pressure: Care Instructions."  Current as of: March 04, 2017  Content Version: 11.9  ?? 2006-2018 [employer]wise, [employer]. Care instructions adapted under license by [employer] (which disclaims liability or warranty for this information). If you have questions about a medical condition or this instruction, always ask your healthcare professional. Healthwise, Incorporated disclaims any warranty or liability for your use of this information.         Altered Mental Status: Care Instructions  Your Care Instructions    Altered mental status is a change in how well your brain is working. As a result, you may be confused, be less alert than usual, or act in odd ways. This may include seeing or hearing things that aren't really there (hallucinations).  A mental status change has many possible causes. For example, it may be the result of an infection, an imbalance of chemicals in the body, or a chronic disease such as diabetes or COPD. It can also be caused by things such as a head injury, taking certain medicines, or using alcohol or drugs.   The doctor may do tests to look for the cause. These tests may include urine tests, blood tests, and imaging tests such as a CT scan. Sometimes a clear cause isn't found. But tests can help the doctor rule out a serious cause of your symptoms.  A change in mental status  can be scary. But mental status will often return to normal when the cause is treated. So it is important to get any follow-up testing or treatment the doctor has suggested.  The doctor has checked you carefully, but problems can develop later. If you notice any problems or new symptoms, get medical treatment right away.  Follow-up care is a key part of your treatment and safety. Be sure to make and go to all appointments, and call your doctor if you are having problems. It's also a good idea to know your test results and keep a list of the medicines you take.  How can you care for yourself at home?  ?? Be safe with medicines. Take your medicines exactly as prescribed. Call your doctor if you think you are having a problem with your medicine.  ?? Have another adult stay with you until you are better. This can help keep you safe. Ask that person to watch for signs that your mental status is getting worse.  When should you call for help?  Call 911 anytime you think you may need emergency care. For example, call if:  ?? ?? You passed out (lost consciousness).   ??Call your doctor now or seek immediate medical care if:  ?? ?? Your mental status is getting worse.   ?? ?? You have new symptoms, such as a fever, chills, or shortness of breath.   ?? ?? You do not feel safe.   ??Watch closely for changes in your health, and be sure to contact your doctor if:  ?? ?? You do not get better as expected.   Where can you learn more?  Go to [employer]Stats.cahttp://www.healthwise.net/GoodHelpConnections.  Enter [phone])500-4855J452 in the search box to learn more about "Altered Mental Status: Care Instructions."  Current as of: January 14, 2017  Content Version: 11.9   ?? 2006-2018 [employer], [employer]. Care instructions adapted under license by [employer] (which disclaims liability or warranty for this information). If you have questions about a medical condition or this instruction, always ask your healthcare professional. [employer]wise, Inc[employer] disclaims any warranty or liability for your use of this information.

## 2017-10-12 ENCOUNTER — Ambulatory Visit
Admit: 2017-10-12 | Discharge: 2017-10-12 | Payer: PRIVATE HEALTH INSURANCE | Attending: Neurology | Primary: Internal Medicine

## 2017-10-12 ENCOUNTER — Encounter: Attending: Neurology | Primary: Internal Medicine

## 2017-10-12 DIAGNOSIS — G40409 Other generalized epilepsy and epileptic syndromes, not intractable, without status epilepticus: Secondary | ICD-10-CM
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
## 2017-10-12 NOTE — Progress Notes (Signed)
Chief Complaint   Patient presents with   ??? Seizure   ??? Stroke   ??? Hospital Follow Up     1. Have you been to the ER, urgent care clinic since your last visit?  Hospitalized since your last visit? [facility] 09/12/17     2. Have you seen or consulted any other health care providers outside of the [facility] since your last visit?  Include any pap smears or colon screening. no

## 2017-10-15 ENCOUNTER — Encounter: Primary: Internal Medicine

## 2017-10-15 NOTE — Progress Notes (Signed)
This Clinical research associatewriter reaches out pt's sister Meghan [name] for final follow up to our last conversation. Pt's name and dob verified as pt identifiers. Per [name] Meghan [name] is doing well. She denies any further falls or seizures.  Dr. Lennart PallAralu dc'd the Depakote d/t pt's outburst- f/u in 3 weeks.    Pt's son is not home yet.   New apartment pending section 8 approval  [name] will reach out to pt's medicaid case worker to request a personal care aide.     Patient has graduated from the Transitions of Care Coordination  program on 3/4.  Patient's symptoms are stable at this time.   Patient/family has the ability to self-manage.   Care management goals have been completed at this time. No further nurse navigator follow up scheduled.    Goals Addressed     None          Pt has nurse navigator's contact information for any further questions, concerns, or needs.  Patients upcoming visits:    Future Appointments   Date Time Provider Department Center   11/02/2017  1:00 PM Aralu, Sheld[facility] Altesletus C[facility]   01/08/2018  8:00 AM Bronson IngGreen, Harold T Jr., [facility]

## 2017-11-02 ENCOUNTER — Encounter: Attending: Neurology | Primary: Internal Medicine

## 2017-11-02 ENCOUNTER — Ambulatory Visit
Admit: 2017-11-02 | Discharge: 2017-11-02 | Payer: PRIVATE HEALTH INSURANCE | Attending: Neurology | Primary: Internal Medicine

## 2017-11-02 DIAGNOSIS — G40219 Localization-related (focal) (partial) symptomatic epilepsy and epileptic syndromes with complex partial seizures, intractable, without status epilepticus: Secondary | ICD-10-CM

## 2017-11-02 MED ORDER — DIVALPROEX 500 MG TAB, DELAYED RELEASE
500 mg | ORAL_TABLET | Freq: Two times a day (BID) | ORAL | 0 refills | Status: DC
Start: 2017-11-02 — End: 2017-12-01

## 2017-11-02 MED ORDER — DONEPEZIL 10 MG TAB
10 mg | ORAL_TABLET | Freq: Every evening | ORAL | 1 refills | Status: DC
Start: 2017-11-02 — End: 2017-12-21

## 2017-11-02 NOTE — Progress Notes (Signed)
Chief Complaint   Patient presents with   ??? Seizure   ??? Medication Refill     1. Have you been to the ER, urgent care clinic since your last visit?  Hospitalized since your last visit? no    2. Have you seen or consulted any other health care providers outside of the [facility] since your last visit?  Include any pap smears or colon screening. no

## 2017-11-02 NOTE — Progress Notes (Signed)
Neurology Progress Note    NAME:  Meghan FuchsBarbara J Champeau   DOB:   02-27-1946   MRN:   [id number]     Date/Time:  11/02/2017  Subjective:      Meghan FuchsBarbara J Palazzi is a 72 y.o. female here today for follow-up for seizure disorder, memory difficulty, neuropathy, pain confusion.  Patient was accompanied by her sister to the visit.  In the last visit, I discontinue Topamax due to patient's confusion and memory problem and for the fact that patient was on multiple medication, I discontinued Topamax left Neurontin, Depakote, and Vimpat, according to patient sister no seizure was reported since the last visit and patient was more lucid even though he continues to forget.  Patient says she is experiencing some headache not as bad as before, headache is throbbing in nature mostly frontal, occasional sharp pain from the back of the head.  She says headache is associated with dizziness, nausea, blurry vision, sometimes it wakes her up from sleep.  Medication helps some.  It should be recalled that patient did not have headaches until after the accident  Again patient cannot stay alone by herself, she will need 24-hour attendant.  Patient sister was patient answering most of the questions during the review of system.  Review of Systems - General ROS: positive for  - fatigue and sleep disturbance  Psychological ROS: positive for - anxiety, concentration difficulties, memory difficulties and sleep disturbances  Ophthalmic ROS: positive for - blurry vision and photophobia  ENT ROS: positive for - headaches, tinnitus, vertigo and visual changes  Allergy and Immunology ROS: negative  Hematological and Lymphatic ROS: negative  Endocrine ROS: negative  Respiratory ROS: no cough, shortness of breath, or wheezing  Cardiovascular ROS: no chest pain or dyspnea on exertion  Gastrointestinal ROS: no abdominal pain, change in bowel habits, or black or bloody stools  Genito-Urinary ROS: no dysuria, trouble voiding, or hematuria   Musculoskeletal ROS: positive for - gait disturbance, joint pain, joint stiffness, muscle pain and muscular weakness  Neurological ROS: positive for - dizziness, gait disturbance, headaches, impaired coordination/balance, numbness/tingling, seizures, tremors, visual changes and weakness  Dermatological ROS: negative    Medications reviewed:  Current Outpatient Medications   Medication Sig Dispense Refill   ??? divalproex DR (DEPAKOTE) 500 mg tablet Take 1 Tab by mouth two (2) times a day. 60 Tab 0   ??? donepezil (ARICEPT) 10 mg tablet Take 1 Tab by mouth nightly. 30 Tab 1   ??? fenofibrate (LOFIBRA) 54 mg tablet Take 54 mg by mouth nightly.     ??? lisinopril (PRINIVIL, ZESTRIL) 10 mg tablet Take 1 Tab by mouth daily. 30 Tab 6   ??? citalopram (CELEXA) 20 mg tablet Take 1 Tab by mouth daily. 30 Tab 3   ??? cholecalciferol (VITAMIN D3) 1,000 unit cap take 1 capsule by mouth once daily 30 Cap 3   ??? fluticasone (FLONASE) 50 mcg/actuation nasal spray 1 Spray by Both Nostrils route daily. 1 Bottle 1   ??? lactulose (ENULOSE) 10 gram/15 mL solution Take 1 tsp by mouth three (3) times daily.     ??? SUMAtriptan (IMITREX) 100 mg tablet Take 100 mg by mouth once as needed for Migraine. May repeat 1 tab in 2 hours, limit: 2 tabs in 24 hours, not more than 2 days a week.     ??? methylphenidate HCl (RITALIN) 5 mg tablet Take 1 Tab (5 mg total) by mouth dailyEarliest Fill Date: 05/11/17.  Max Daily Amount: 5 mg 30  Tab 0   ??? aspirin 81 mg chewable tablet Take 81 mg by mouth daily.     ??? gabapentin (NEURONTIN) 100 mg capsule TAKE 1 CAPSULE BY MOUTH THREE TIMES DAILY 90 Cap 0   ??? lacosamide (VIMPAT) 100 mg tab tablet Take 1 Tab by mouth two (2) times a day. Max Daily Amount: 200 mg. 60 Tab 1        Objective:   Vitals:  Vitals:    11/02/17 0809   BP: 132/86   Pulse: 88   Resp: 16   Temp: 97.9 ??F (36.6 ??C)   TempSrc: Temporal   SpO2: 97%   Weight: 167 lb 9.6 oz (76 kg)   Height: 5\' 5"  (1.651 m)   PainSc:   0 - No pain     Lab Data Reviewed:   Lab Results   Component Value Date/Time    WBC 6.6 09/13/2017 03:09 AM    HCT 36.9 09/13/2017 03:09 AM    HGB 11.8 09/13/2017 03:09 AM    PLATELET 392 09/13/2017 03:09 AM       Lab Results   Component Value Date/Time    Sodium 140 09/13/2017 03:09 AM    Potassium 3.5 09/13/2017 03:09 AM    Chloride 107 09/13/2017 03:09 AM    CO2 25 09/13/2017 03:09 AM    Glucose 62 (L) 09/13/2017 03:09 AM    BUN 8 09/13/2017 03:09 AM    Creatinine 0.91 09/13/2017 03:09 AM    Calcium 9.1 09/13/2017 03:09 AM       No components found for: TROPQUANT    No results found for: ANA      Lab Results   Component Value Date/Time    Hemoglobin A1c 5.2 09/13/2017 03:10 AM    Hemoglobin A1c (POC) 5.2 07/11/2017 12:37 PM        Lab Results   Component Value Date/Time    Vitamin B12 1,157 (H) 06/20/2017 07:23 AM    Folate 14.1 06/20/2017 07:23 AM       No results found for: ANA, Everlean Alstrom, XBANA    Lab Results   Component Value Date/Time    Cholesterol, total 192 09/13/2017 03:10 AM    Cholesterol (POC) 200 07/11/2017 12:37 PM    HDL Cholesterol 50 09/13/2017 03:10 AM    HDL Cholesterol (POC) 61 07/11/2017 12:37 PM    LDL Cholesterol (POC) 119 16/05/9603 12:37 PM    LDL, calculated 125.2 (H) 54/04/8118 03:10 AM    VLDL, calculated 16.8 09/13/2017 03:10 AM    Triglyceride 84 09/13/2017 03:10 AM    Triglycerides (POC) 100 07/11/2017 12:37 PM    CHOL/HDL Ratio 3.8 09/13/2017 03:10 AM         CT Results (recent):  Results from Hospital Encounter encounter on 09/12/17   CT PERF W CBF    Narrative Preliminary report: No evidence of large vessel occlusion, perfusion  abnormality, or intracranial aneurysm. No appreciable carotid artery stenosis.    Full report to be dictated at a later time.    CLINICAL HISTORY: Word finding difficulty    EXAMINATION:  CT ANGIOGRAPHY HEAD AND NECK, CT PERFUSION    COMPARISON: MRI brain 07/19/2017    TECHNIQUE:  Following the uneventful administration of iodinated contrast   material, axial CT angiography of the head and neck was performed. Delayed axial  images through the head were also obtained. Coronal and sagittal reconstructions  were obtained. Manual postprocessing of images was performed. 3-D  Sagittal  maximal intensity projection  images were obtained.  3-D Coronal maximal  intensity projections were obtained. CT brain perfusion was performed with  generation of hemodynamic maps of multiple parameters, including cerebral blood  flow, cerebral blood volume, mean transit time. CT dose reduction was achieved  through use of a standardized protocol tailored for this examination and  automatic exposure control for dose modulation.     FINDINGS:    Delayed contrast-enhanced head CT:    The ventricles are midline without hydrocephalus.  There is no acute intra or  extra-axial hemorrhage. The basal cisterns are clear.  The paranasal sinuses are  clear.    CTA NECK:    Great vessels: Normal arch anatomy with the origins patent.    Right subclavian artery: Patent    Left subclavian artery: Patent     Right common carotid artery: Patent     Left common carotid artery: Patent     Cervical right internal carotid artery: Patent with no significant stenosis by  NASCET criteria.    Cervical left internal carotid artery: Patent with no significant stenosis by  NASCET criteria.    Right vertebral artery: Patent    Left vertebral artery: Dominant left vertebral artery, patent.    Lung apices are clear. Multiple heterogeneous nodules throughout the enlarged  left thyroid lobe. No cervical lymphadenopathy.    CTA HEAD:    Right cavernous internal carotid artery: Patent    Left cavernous internal carotid artery: Patent    Anterior cerebral arteries: Patent    Anterior communicating artery: Patent    Right middle cerebral artery: Patent    Left middle cerebral artery: Patent    Posterior communicating arteries: Patent    Posterior cerebral arteries: Patent    Basilar artery: Patent     Distal vertebral arteries: Right vertebral artery terminates as a PICA. Dominant  left vertebral artery supplying the basilar artery.    No evidence for intracranial aneurysm or hemodynamically significant stenosis.    CT Perfusion:  Normal CT perfusion.      Impression IMPRESSION:  1.  No evidence of large vessel occlusion, intracranial aneurysm, or  flow-limiting stenosis.   2.  Normal CT perfusion.       MRI Results (recent):  Results from Hospital Encounter encounter on 09/12/17   MRI BRAIN WO CONT    Narrative EXAM:  MRI BRAIN WO CONT    INDICATION:  cva work up. Patient presents with expressive and receptive aphasia  as well as an acute encephalopathy. Somnolence.    COMPARISON: 12.6.2018.    CONTRAST:  None.    TECHNIQUE: Sagittal T1, axial FLAIR, T2,T1 and gradient echo images as well as  coronal T2 weighted images and axial diffusion weighted images of the head were  obtained.    FINDINGS: The ventricular size and configuration are normal. Mild confluent  periventricular white matter FLAIR hyperintensity is seen. There are a few  random oval foci of FLAIR hyperintensity in the centrum semiovale and corona  radiata bilaterally as well as within the pons, unchanged as compared to the  prior study. Dolichocephaly is noted. No hemosiderin products are identified on  the gradient series. There is no evidence of mass, hemorrhage, acute infarct or  abnormal extra-axial fluid collection.  Normal appearing flow-voids are present  in the vertebral, basilar and carotid artery systems. The craniocervical  junction is normal. The structures at the cranial base including paranasal  sinuses and mastoid air cells are unremarkable. An erosive changes present at  the C1-2 articulation  with resultant bowing of the PLL. There are borderline low  lying cerebellar tonsils.      Impression IMPRESSION: No evidence for acute stroke       IR Results (recent):  No results found for this or any previous visit.     VAS/US Results (recent):  Results from Hospital Encounter encounter on 03/22/17   DUPLEX CAROTID BILATERAL       PHYSICAL EXAM:  General:    Alert, cooperative, no distress, appears stated age.     Head:   Normocephalic, without obvious abnormality, atraumatic.  Eyes:   Conjunctivae/corneas clear.  PERRLA  Nose:  Nares normal. No drainage or sinus tenderness.  Throat:    Lips, mucosa, and tongue normal.  No Thrush  Neck:  Supple, symmetrical,  no adenopathy, thyroid: non tender    no carotid bruit and no JVD.  Back:    Symmetric,  No CVA tenderness.  Lungs:   Clear to auscultation bilaterally.  No Wheezing or Rhonchi. No rales.  Chest wall:  No tenderness or deformity. No Accessory muscle use.  Heart:   Regular rate and rhythm,  no murmur, rub or gallop.  Abdomen:   Soft, non-tender. Not distended.  Bowel sounds normal. No masses  Extremities: Extremities normal, atraumatic, No cyanosis.  No edema. No clubbing  Skin:     Texture, turgor normal. No rashes or lesions.  Not Jaundiced  Lymph nodes: Cervical, supraclavicular normal.  Psych:  Good insight.  Not depressed.  Not anxious or agitated.      NEUROLOGICAL EXAM:  Appearance:  The patient is well developed, well nourished,   and is in no acute distress.   Mental Status: Oriented to time, place and person. Mood and affect appropriate.   Cranial Nerves:   Intact visual fields. Fundi are benign. PERLA, EOM's full, no nystagmus, no ptosis. Facial sensation is normal. Corneal reflexes are intact. Facial movement is symmetric. Hearing is normal bilaterally. Palate is midline with normal sternocleidomastoid and trapezius muscles are normal. Tongue is midline.   Motor:  5/5 strength in upper and lower proximal and distal muscles. Normal bulk and tone. No fasciculations.   Reflexes:   Deep tendon reflexes 2+/4 and symmetrical.   Sensory:   Normal to touch, pinprick and vibration.   Gait:   Slightly unsteady gait.   Tremor:   No tremor noted.    Cerebellar:  No cerebellar signs present.   Neurovascular:  Normal heart sounds and regular rhythm, peripheral pulses intact, and no carotid bruits.       Assesment  1. Partial symptomatic epilepsy with complex partial seizures, intractable, without status epilepticus (HCC)  Continue current management    2. Cognitive impairment  Continue management    3. Headache disorder  Continue management    4. Dementia without behavioral disturbance, unspecified dementia type  Stable    5. Migraine without aura and without status migrainosus, not intractable  Stable    ___________________________________________________  PLAN: Medication and plan discussed with patient and her sister      ICD-10-CM ICD-9-CM    1. Partial symptomatic epilepsy with complex partial seizures, intractable, without status epilepticus (HCC) G40.219 345.41    2. Cognitive impairment R41.89 294.9    3. Headache disorder R51 784.0    4. Dementia without behavioral disturbance, unspecified dementia type F03.90 294.20    5. Migraine without aura and without status migrainosus, not intractable G43.009 346.10      Follow-up and Dispositions    ?? Return  in about 2 months (around 01/02/2018).              ___________________________________________________    Total time spent with patient:  [] 15   [] 25   [] 35   []  __ minutes    Care Plan discussed with:    [] Patient   [] Family    [] Care Manager   [] Consultant/Specialist :    ___________________________________________________    Attending Physician: Forrestine Him, MD

## 2017-11-09 ENCOUNTER — Encounter

## 2017-11-12 ENCOUNTER — Encounter: Attending: Nurse Practitioner | Primary: Internal Medicine

## 2017-11-12 MED ORDER — LACOSAMIDE 100 MG TAB
100 mg | ORAL_TABLET | Freq: Two times a day (BID) | ORAL | 1 refills | Status: DC
Start: 2017-11-12 — End: 2018-01-11

## 2017-11-13 MED ORDER — GABAPENTIN 100 MG CAP
100 mg | ORAL_CAPSULE | ORAL | 0 refills | Status: DC
Start: 2017-11-13 — End: 2017-12-12

## 2017-12-02 MED ORDER — DIVALPROEX 500 MG TAB, DELAYED RELEASE
500 mg | ORAL_TABLET | ORAL | 0 refills | Status: DC
Start: 2017-12-02 — End: 2018-01-06

## 2017-12-12 ENCOUNTER — Encounter

## 2017-12-12 MED ORDER — FENOFIBRATE 54 MG TAB
54 mg | ORAL_TABLET | ORAL | 0 refills | Status: DC
Start: 2017-12-12 — End: 2017-12-31

## 2017-12-12 MED ORDER — GABAPENTIN 100 MG CAP
100 mg | ORAL_CAPSULE | ORAL | 0 refills | Status: DC
Start: 2017-12-12 — End: 2018-01-11

## 2017-12-22 MED ORDER — DONEPEZIL 10 MG TAB
10 mg | ORAL_TABLET | ORAL | 0 refills | Status: DC
Start: 2017-12-22 — End: 2018-01-28

## 2017-12-31 ENCOUNTER — Inpatient Hospital Stay
Admit: 2017-12-31 | Discharge: 2018-01-03 | Disposition: A | Discharge status: Home / Self Care | Payer: MEDICARE | Attending: Internal Medicine | Admitting: Internal Medicine

## 2017-12-31 ENCOUNTER — Emergency Department: Admit: 2017-12-31 | Payer: MEDICARE | Primary: Internal Medicine

## 2017-12-31 ENCOUNTER — Inpatient Hospital Stay

## 2017-12-31 DIAGNOSIS — G40509 Epileptic seizures related to external causes, not intractable, without status epilepticus: Secondary | ICD-10-CM

## 2017-12-31 LAB — CBC W/O DIFF
ABSOLUTE NRBC: 0 10*3/uL (ref 0.00–0.01)
HCT: 41 % (ref 35.0–47.0)
HGB: 13.5 g/dL (ref 11.5–16.0)
MCH: 30.7 PG (ref 26.0–34.0)
MCHC: 32.9 g/dL (ref 30.0–36.5)
MCV: 93.2 FL (ref 80.0–99.0)
MPV: 10.2 FL (ref 8.9–12.9)
NRBC: 0 PER 100 WBC
PLATELET: 456 10*3/uL — ABNORMAL HIGH (ref 150–400)
RBC: 4.4 M/uL (ref 3.80–5.20)
RDW: 14.6 % — ABNORMAL HIGH (ref 11.5–14.5)
WBC: 11 10*3/uL (ref 3.6–11.0)

## 2017-12-31 LAB — METABOLIC PANEL, COMPREHENSIVE
A-G Ratio: 0.9 — ABNORMAL LOW (ref 1.1–2.2)
ALT (SGPT): 29 U/L (ref 12–78)
AST (SGOT): 40 U/L — ABNORMAL HIGH (ref 15–37)
Albumin: 3.6 g/dL (ref 3.5–5.0)
Alk. phosphatase: 52 U/L (ref 45–117)
Anion gap: 9 mmol/L (ref 5–15)
BUN/Creatinine ratio: 14 (ref 12–20)
BUN: 15 MG/DL (ref 6–20)
Bilirubin, total: 0.4 MG/DL (ref 0.2–1.0)
CO2: 28 mmol/L (ref 21–32)
Calcium: 9.4 MG/DL (ref 8.5–10.1)
Chloride: 103 mmol/L (ref 97–108)
Creatinine: 1.08 MG/DL — ABNORMAL HIGH (ref 0.55–1.02)
GFR est AA: >60 mL/min/{1.73_m2} (ref >60)
GFR est non-AA: 50 mL/min/{1.73_m2} — ABNORMAL LOW (ref >60)
Globulin: 4.2 g/dL — ABNORMAL HIGH (ref 2.0–4.0)
Glucose: 122 mg/dL — ABNORMAL HIGH (ref 65–100)
Potassium: 4.2 mmol/L (ref 3.5–5.1)
Protein, total: 7.8 g/dL (ref 6.4–8.2)
Sodium: 140 mmol/L (ref 136–145)

## 2017-12-31 LAB — URINALYSIS W/ REFLEX CULTURE
Bacteria: NEGATIVE /hpf
Bilirubin: NEGATIVE
Blood: NEGATIVE
Glucose: NEGATIVE mg/dL
Leukocyte Esterase: NEGATIVE
Nitrites: NEGATIVE
Protein: NEGATIVE mg/dL
Specific gravity: 1.023 (ref 1.003–1.030)
Urobilinogen: 0.2 EU/dL (ref 0.2–1.0)
pH (UA): 6 (ref 5.0–8.0)

## 2017-12-31 LAB — LACTIC ACID: Lactic acid: 3.1 MMOL/L — CR (ref 0.4–2.0)

## 2017-12-31 LAB — CK: CK: 277 U/L — ABNORMAL HIGH (ref 26–192)

## 2017-12-31 MED ORDER — LORAZEPAM 2 MG/ML IJ SOLN
2 mg/mL | INTRAMUSCULAR | Status: DC | PRN
Start: 2017-12-31 — End: 2018-01-03

## 2017-12-31 MED ORDER — FENOFIBRATE NANOCRYSTALLIZED 48 MG TAB
48 mg | Freq: Every day | ORAL | Status: DC
Start: 2017-12-31 — End: 2018-01-03
  Administered 2018-01-01 – 2018-01-03 (×3): via ORAL

## 2017-12-31 MED ORDER — SODIUM CHLORIDE 0.9 % IV
500 mg/5 mL (100 mg/mL) | INTRAVENOUS | Status: AC
Start: 2017-12-31 — End: 2017-12-31
  Administered 2017-12-31: 21:00:00 via INTRAVENOUS

## 2017-12-31 MED ORDER — LACTULOSE 10 GRAM/15 ML ORAL SOLUTION
10 gram/15 mL | Freq: Three times a day (TID) | ORAL | Status: DC
Start: 2017-12-31 — End: 2018-01-03
  Administered 2018-01-02 – 2018-01-03 (×6): via ORAL

## 2017-12-31 MED ORDER — SODIUM CHLORIDE 0.9 % IJ SYRG
INTRAMUSCULAR | Status: DC | PRN
Start: 2017-12-31 — End: 2018-01-03

## 2017-12-31 MED ORDER — VALPROIC ACID (AS SODIUM SALT) 250 MG/5 ML ORAL SOLN
250 mg/5 mL (5 mL) | ORAL | Status: DC
Start: 2017-12-31 — End: 2017-12-31
  Administered 2017-12-31: 18:00:00 via ORAL

## 2017-12-31 MED ORDER — SODIUM CHLORIDE 0.9% BOLUS IV
0.9 % | INTRAVENOUS | Status: AC
Start: 2017-12-31 — End: 2018-01-01
  Administered 2017-12-31: 22:00:00 via INTRAVENOUS

## 2017-12-31 MED ORDER — LISINOPRIL 5 MG TAB
5 mg | Freq: Every day | ORAL | Status: DC
Start: 2017-12-31 — End: 2018-01-03
  Administered 2018-01-01 – 2018-01-03 (×3): via ORAL

## 2017-12-31 MED ORDER — FLUTICASONE 50 MCG/ACTUATION NASAL SPRAY, SUSP
50 mcg/actuation | Freq: Every day | NASAL | Status: DC
Start: 2017-12-31 — End: 2018-01-03
  Administered 2018-01-01 – 2018-01-03 (×2): via NASAL

## 2017-12-31 MED ORDER — LACOSAMIDE 100 MG TAB
100 mg | Freq: Two times a day (BID) | ORAL | Status: DC
Start: 2017-12-31 — End: 2017-12-31
  Administered 2018-01-01: 01:00:00 via ORAL

## 2017-12-31 MED ORDER — ENOXAPARIN 30 MG/0.3 ML SUB-Q SYRINGE
30 mg/0.3 mL | SUBCUTANEOUS | Status: DC
Start: 2017-12-31 — End: 2018-01-01
  Administered 2018-01-01: 13:00:00 via SUBCUTANEOUS

## 2017-12-31 MED ORDER — DIVALPROEX 250 MG TAB, DELAYED RELEASE
250 mg | Freq: Two times a day (BID) | ORAL | Status: DC
Start: 2017-12-31 — End: 2018-01-03
  Administered 2018-01-01 – 2018-01-03 (×5): via ORAL

## 2017-12-31 MED ORDER — SODIUM CHLORIDE 0.9 % IJ SYRG
Freq: Three times a day (TID) | INTRAMUSCULAR | Status: DC
Start: 2017-12-31 — End: 2018-01-03
  Administered 2018-01-01 – 2018-01-03 (×8): via INTRAVENOUS

## 2017-12-31 MED ORDER — SODIUM CHLORIDE 0.9 % IV
INTRAVENOUS | Status: DC
Start: 2017-12-31 — End: 2018-01-03
  Administered 2018-01-01 – 2018-01-02 (×3): via INTRAVENOUS

## 2017-12-31 MED ORDER — ASPIRIN 81 MG CHEWABLE TAB
81 mg | Freq: Every day | ORAL | Status: DC
Start: 2017-12-31 — End: 2018-01-03
  Administered 2018-01-01 – 2018-01-03 (×3): via ORAL

## 2017-12-31 MED ORDER — METHYLPHENIDATE 5 MG TAB
5 mg | Freq: Every day | ORAL | Status: DC
Start: 2017-12-31 — End: 2018-01-03
  Administered 2018-01-01 – 2018-01-03 (×3): via ORAL

## 2017-12-31 MED FILL — LACTULOSE 20 GRAM/30 ML ORAL SOLUTION: 20 gram/30 mL | ORAL | Qty: 5

## 2017-12-31 MED FILL — VALPROATE SODIUM 100 MG/ML IV: 500 mg/5 mL (100 mg/mL) | INTRAVENOUS | Qty: 5

## 2017-12-31 MED FILL — SODIUM CHLORIDE 0.9 % IV: INTRAVENOUS | Qty: 1000

## 2017-12-31 MED FILL — VALPROIC ACID (AS SODIUM SALT) 250 MG/5 ML ORAL SOLN: 250 mg/5 mL (5 mL) | ORAL | Qty: 10

## 2017-12-31 NOTE — Progress Notes (Addendum)
Pharmacy Clarification of the Prior to Admission Medication Regimen Retrospective to the Admission Medication Reconciliation - Follow Up Needed    The patient was NOT interviewed regarding clarification of the prior to admission medication regimen due to AMS.    MHT tried to call the patients daughter, Suzette Battiest, [phone]) & [phone]), however both numbers were not working.    MHT called the outpatient pharmacy on file, [facility], [phone], and spoke with, Reggie Pile, [facility], who was able to confirm the current medications in the patients profile.    PTA Med List based on information given by the outpatient pharmacy. MHT kept any PRN or OTC medications on PTA Med List until further confirmation can be made.    No family or friends were present in the room during the time of interview.    Compliance and last doses administered are unknown at this time.    Information Obtained From: Outpatient Pharmacy, RX Query, RX Bottles    Recommendations/Findings:   The following amendments were made to the patient's active medication list on file at [facility]:     1) Additions: None      2) Removals:   ? Ritalin      3) Changes: None      4) Pertinent Pharmacy Findings:    The medication history will need to be re-evaluated at a later time during admission when patient is willing/able to participate or if more information is provided.       PTA medication list was corrected to the following:     Prior to Admission Medications   Prescriptions Last Dose Informant Patient Reported? Taking?   SUMAtriptan (IMITREX) 100 mg tablet Unknown at Unknown time Other Yes No   Sig: Take 100 mg by mouth once as needed for Migraine. May repeat 1 tab in 2 hours, limit: 2 tabs in 24 hours, not more than 2 days a week.   aspirin 81 mg chewable tablet Unknown at Unknown time Other Yes No   Sig: Take 81 mg by mouth daily.   cholecalciferol (VITAMIN D3) 1,000 unit cap Unknown at Unknown time Other No No   Sig: take 1 capsule by mouth once daily    citalopram (CELEXA) 20 mg tablet Unknown at Unknown time Other No No   Sig: Take 1 Tab by mouth daily.   divalproex DR (DEPAKOTE) 500 mg tablet Unknown at Unknown time Other No No   Sig: TAKE 1 TABLET BY MOUTH TWICE DAILY   donepezil (ARICEPT) 10 mg tablet Unknown at Unknown time Other No No   Sig: TAKE 1 TABLET BY MOUTH EVERY NIGHT   fenofibrate (LOFIBRA) 54 mg tablet Unknown at Unknown time Other Yes No   Sig: Take 54 mg by mouth nightly.   fluticasone (FLONASE) 50 mcg/actuation nasal spray Unknown at Unknown time Other No No   Sig: 1 Spray by Both Nostrils route daily.   gabapentin (NEURONTIN) 100 mg capsule Unknown at Unknown time Other No No   Sig: TAKE 1 CAPSULE BY MOUTH THREE TIMES DAILY   lacosamide (VIMPAT) 100 mg tab tablet Unknown at Unknown time Other No No   Sig: Take 1 Tab by mouth two (2) times a day. Max Daily Amount: 200 mg.   lactulose (ENULOSE) 10 gram/15 mL solution Unknown at Unknown time Other Yes No   Sig: Take 1 tsp by mouth three (3) times daily.   lisinopril (PRINIVIL, ZESTRIL) 10 mg tablet Unknown at Unknown time Other No No   Sig: Take 1 Tab by  mouth daily.      Facility-Administered Medications: None          Thank you,  Aram Beecham Arline Asp) Orson Slick, CPhT  Medication History Pharmacologist

## 2017-12-31 NOTE — ED Notes (Signed)
Mini cath performed per Rn with assistance x3.  Urine specimen obtained and sent to lab. Tolerated well.

## 2017-12-31 NOTE — ED Notes (Signed)
Patient taken to CT per cart
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
## 2017-12-31 NOTE — ED Triage Notes (Signed)
Patient last seen normal on Saturday.  Daughter went over to house around 0700 but no one answered the door.  Returned at 1230 and when no one answered she got the key and then found patient on the floor, not responding to anyone,  + incontinence.

## 2017-12-31 NOTE — ED Notes (Signed)
Attempted to get labs. #2 IV site to left inner wrist but site will not draw labs.
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
## 2017-12-31 NOTE — Progress Notes (Signed)
Pharmacy Clarification of the Prior to Admission Medication Regimen Retrospective to the Admission Medication Reconciliation - Follow Up Needed    The patient was NOT interviewed regarding clarification of the prior to admission medication regimen due to AMS.    MHT tried to call the patients daughter, Suzette Battiest, [phone]) & [phone]), however both numbers were not working.    MHT called the outpatient pharmacy on file, [facility], [phone], and spoke with, Reggie Pile, [facility], who was able to confirm the current medications in the patients profile.    PTA Med List based on information given by the outpatient pharmacy. MHT kept any PRN or OTC medications on PTA Med List until further confirmation can be made.    No family or friends were present in the room during the time of interview.    Compliance and last doses administered are unknown at this time.    Information Obtained From: Outpatient Pharmacy, RX Query, RX Bottles    Recommendations/Findings:   The following amendments were made to the patient's active medication list on file at [facility]:     1) Additions: None      2) Removals:   . Ritalin      3) Changes: None      4) Pertinent Pharmacy Findings:    The medication history will need to be re-evaluated at a later time during admission when patient is willing/able to participate or if more information is provided.       PTA medication list was corrected to the following:     Prior to Admission Medications   Prescriptions Last Dose Informant Patient Reported? Taking?   SUMAtriptan (IMITREX) 100 mg tablet Unknown at Unknown time Other Yes No   Sig: Take 100 mg by mouth once as needed for Migraine. May repeat 1 tab in 2 hours, limit: 2 tabs in 24 hours, not more than 2 days a week.   aspirin 81 mg chewable tablet Unknown at Unknown time Other Yes No   Sig: Take 81 mg by mouth daily.   cholecalciferol (VITAMIN D3) 1,000 unit cap Unknown at Unknown time Other No No   Sig: take 1 capsule by mouth once daily    citalopram (CELEXA) 20 mg tablet Unknown at Unknown time Other No No   Sig: Take 1 Tab by mouth daily.   divalproex DR (DEPAKOTE) 500 mg tablet Unknown at Unknown time Other No No   Sig: TAKE 1 TABLET BY MOUTH TWICE DAILY   donepezil (ARICEPT) 10 mg tablet Unknown at Unknown time Other No No   Sig: TAKE 1 TABLET BY MOUTH EVERY NIGHT   fenofibrate (LOFIBRA) 54 mg tablet Unknown at Unknown time Other Yes No   Sig: Take 54 mg by mouth nightly.   fluticasone (FLONASE) 50 mcg/actuation nasal spray Unknown at Unknown time Other No No   Sig: 1 Spray by Both Nostrils route daily.   gabapentin (NEURONTIN) 100 mg capsule Unknown at Unknown time Other No No   Sig: TAKE 1 CAPSULE BY MOUTH THREE TIMES DAILY   lacosamide (VIMPAT) 100 mg tab tablet Unknown at Unknown time Other No No   Sig: Take 1 Tab by mouth two (2) times a day. Max Daily Amount: 200 mg.   lactulose (ENULOSE) 10 gram/15 mL solution Unknown at Unknown time Other Yes No   Sig: Take 1 tsp by mouth three (3) times daily.   lisinopril (PRINIVIL, ZESTRIL) 10 mg tablet Unknown at Unknown time Other No No   Sig: Take 1 Tab by  mouth daily.      Facility-Administered Medications: None          Thank you,  Aram Beecham Arline Asp) Orson Slick, CPhT  Medication History Pharmacologist
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
## 2018-01-01 LAB — CBC WITH AUTOMATED DIFF
ABS. BASOPHILS: 0 10*3/uL (ref 0.0–0.1)
ABS. EOSINOPHILS: 0 10*3/uL (ref 0.0–0.4)
ABS. IMM. GRANS.: 0 10*3/uL (ref 0.00–0.04)
ABS. LYMPHOCYTES: 2.2 10*3/uL (ref 0.8–3.5)
ABS. MONOCYTES: 1.4 10*3/uL — ABNORMAL HIGH (ref 0.0–1.0)
ABS. NEUTROPHILS: 8.8 10*3/uL — ABNORMAL HIGH (ref 1.8–8.0)
ABSOLUTE NRBC: 0 10*3/uL (ref 0.00–0.01)
BASOPHILS: 0 % (ref 0–1)
EOSINOPHILS: 0 % (ref 0–7)
HCT: 38.6 % (ref 35.0–47.0)
HGB: 12.9 g/dL (ref 11.5–16.0)
IMMATURE GRANULOCYTES: 0 % (ref 0.0–0.5)
LYMPHOCYTES: 18 % (ref 12–49)
MCH: 30.9 PG (ref 26.0–34.0)
MCHC: 33.4 g/dL (ref 30.0–36.5)
MCV: 92.6 FL (ref 80.0–99.0)
MONOCYTES: 11 % (ref 5–13)
MPV: 10.6 FL (ref 8.9–12.9)
NEUTROPHILS: 71 % (ref 32–75)
NRBC: 0 PER 100 WBC
PLATELET: 466 10*3/uL — ABNORMAL HIGH (ref 150–400)
RBC: 4.17 M/uL (ref 3.80–5.20)
RDW: 14.7 % — ABNORMAL HIGH (ref 11.5–14.5)
WBC: 12.5 10*3/uL — ABNORMAL HIGH (ref 3.6–11.0)

## 2018-01-01 LAB — GLUCOSE, POC
Glucose (POC): 116 mg/dL — ABNORMAL HIGH (ref 65–100)
Glucose (POC): 110 mg/dL — ABNORMAL HIGH (ref 65–100)
Glucose (POC): 107 mg/dL — ABNORMAL HIGH (ref 65–100)

## 2018-01-01 LAB — METABOLIC PANEL, COMPREHENSIVE
A-G Ratio: 0.8 — ABNORMAL LOW (ref 1.1–2.2)
ALT (SGPT): 25 U/L (ref 12–78)
AST (SGOT): 55 U/L — ABNORMAL HIGH (ref 15–37)
Albumin: 3.2 g/dL — ABNORMAL LOW (ref 3.5–5.0)
Alk. phosphatase: 46 U/L (ref 45–117)
Anion gap: 5 mmol/L (ref 5–15)
BUN/Creatinine ratio: 15 (ref 12–20)
BUN: 15 MG/DL (ref 6–20)
Bilirubin, total: 0.5 MG/DL (ref 0.2–1.0)
CO2: 26 mmol/L (ref 21–32)
Calcium: 9.1 MG/DL (ref 8.5–10.1)
Chloride: 106 mmol/L (ref 97–108)
Creatinine: 0.99 MG/DL (ref 0.55–1.02)
GFR est AA: >60 mL/min/{1.73_m2} (ref >60)
GFR est non-AA: 56 mL/min/{1.73_m2} — ABNORMAL LOW (ref >60)
Globulin: 3.8 g/dL (ref 2.0–4.0)
Glucose: 104 mg/dL — ABNORMAL HIGH (ref 65–100)
Potassium: 4.3 mmol/L (ref 3.5–5.1)
Protein, total: 7 g/dL (ref 6.4–8.2)
Sodium: 137 mmol/L (ref 136–145)

## 2018-01-01 LAB — VALPROIC ACID: Valproic acid: 79 ug/ml (ref 50–100)

## 2018-01-01 LAB — POCT GLUCOSE
POC Glucose: 116 mg/dL — ABNORMAL HIGH (ref 65–100)
POC Glucose: 110 mg/dL — ABNORMAL HIGH (ref 65–100)
POC Glucose: 107 mg/dL — ABNORMAL HIGH (ref 65–100)

## 2018-01-01 MED ORDER — PHARMACY INFORMATION NOTE
Freq: Once | Status: AC
Start: 2018-01-01 — End: 2018-01-01

## 2018-01-01 MED ORDER — ENOXAPARIN 40 MG/0.4 ML SUB-Q SYRINGE
40 mg/0.4 mL | SUBCUTANEOUS | Status: DC
Start: 2018-01-01 — End: 2018-01-03
  Administered 2018-01-02 – 2018-01-03 (×2): via SUBCUTANEOUS

## 2018-01-01 MED ORDER — GLUCOSE 4 GRAM CHEWABLE TAB
4 gram | ORAL | Status: DC | PRN
Start: 2018-01-01 — End: 2018-01-03

## 2018-01-01 MED ORDER — DEXTROSE 50% IN WATER (D50W) IV SYRG
INTRAVENOUS | Status: DC | PRN
Start: 2018-01-01 — End: 2018-01-03

## 2018-01-01 MED ORDER — SODIUM CHLORIDE 0.9 % IV
20020 mg/20 mL | Freq: Two times a day (BID) | INTRAVENOUS | Status: DC
Start: 2018-01-01 — End: 2018-01-03
  Administered 2018-01-01 – 2018-01-03 (×6): via INTRAVENOUS

## 2018-01-01 MED ORDER — GLUCAGON 1 MG INJECTION
1 mg | INTRAMUSCULAR | Status: DC | PRN
Start: 2018-01-01 — End: 2018-01-03

## 2018-01-01 MED ORDER — INSULIN LISPRO 100 UNIT/ML INJECTION
100 unit/mL | Freq: Four times a day (QID) | SUBCUTANEOUS | Status: DC
Start: 2018-01-01 — End: 2018-01-03

## 2018-01-01 MED ORDER — HYDRALAZINE 25 MG TAB
25 mg | Freq: Three times a day (TID) | ORAL | Status: DC | PRN
Start: 2018-01-01 — End: 2018-01-03

## 2018-01-01 MED FILL — DEPAKOTE 250 MG TABLET,DELAYED RELEASE: 250 mg | ORAL | Qty: 2

## 2018-01-01 MED FILL — VIMPAT 200 MG/20 ML INTRAVENOUS SOLUTION: 200 mg/20 mL | INTRAVENOUS | Qty: 20

## 2018-01-01 MED FILL — CHILDREN'S ASPIRIN 81 MG CHEWABLE TABLET: 81 mg | ORAL | Qty: 1

## 2018-01-01 MED FILL — VIMPAT 100 MG TABLET: 100 mg | ORAL | Qty: 1

## 2018-01-01 MED FILL — FENOFIBRATE NANOCRYSTALLIZED 48 MG TAB: 48 mg | ORAL | Qty: 1

## 2018-01-01 MED FILL — PHARMACY INFORMATION NOTE: Qty: 1

## 2018-01-01 MED FILL — FLUTICASONE 50 MCG/ACTUATION NASAL SPRAY, SUSP: 50 mcg/actuation | NASAL | Qty: 16

## 2018-01-01 MED FILL — VIMPAT 200 MG/20 ML INTRAVENOUS SOLUTION: 200 mg/20 mL | INTRAVENOUS | Qty: 10

## 2018-01-01 MED FILL — METHYLPHENIDATE 5 MG TAB: 5 mg | ORAL | Qty: 1

## 2018-01-01 MED FILL — LISINOPRIL 5 MG TAB: 5 mg | ORAL | Qty: 2

## 2018-01-01 NOTE — Progress Notes (Signed)
Report received from Keeley, RN

## 2018-01-01 NOTE — Progress Notes (Signed)
Chart reviewed. Spoke with patients nurse. Patient is off the floor for tests at this time.  Will check back later today.    Shamere Dilworth D Isabel Ardila, PT

## 2018-01-01 NOTE — Procedures (Signed)
[facility]   [address]   [place], [place] [address]      Electroencephalogram         Procedure Date: 01/01/2018    Procedure ID: [id number]    Procedure Type: Routine    Patient Name: Meghan [name]     Date of Birth: 1946/03/15    Medical Record No: [id number]    INDICATION: Altered mental status    Medications:  Current Facility-Administered Medications   Medication Dose Route Frequency   ??? citalopram (CELEXA) tablet 20 mg  20 mg Oral DAILY   ??? donepezil (ARICEPT) tablet 10 mg  10 mg Oral QHS   ??? enoxaparin (LOVENOX) injection 40 mg  40 mg SubCUTAneous Q24H   ??? hydrALAZINE (APRESOLINE) tablet 25 mg  25 mg Oral TID PRN   ??? glucose chewable tablet 16 g  4 Tab Oral PRN   ??? dextrose (D50W) injection syrg 12.5-25 g  25-50 mL IntraVENous PRN   ??? glucagon (GLUCAGEN) injection 1 mg  1 mg IntraMUSCular PRN   ??? insulin lispro (HUMALOG) injection   SubCUTAneous AC&HS   ??? aspirin chewable tablet 81 mg  81 mg Oral DAILY   ??? divalproex DR (DEPAKOTE) tablet 500 mg  500 mg Oral Q12H   ??? fenofibrate nanocrystallized (TRICOR) tablet 48 mg  48 mg Oral DAILY   ??? fluticasone propionate (FLONASE) 50 mcg/actuation nasal spray 1 Spray  1 Spray Both Nostrils DAILY   ??? lactulose (CHRONULAC) 10 gram/15 mL solution 5 mL  5 mL Oral TID   ??? lisinopril (PRINIVIL, ZESTRIL) tablet 10 mg  10 mg Oral DAILY   ??? methylphenidate HCl (RITALIN) tablet 5 mg  5 mg Oral DAILY   ??? LORazepam (ATIVAN) injection 1 mg  1 mg IntraVENous Q4H PRN   ??? sodium chloride (NS) flush 5-40 mL  5-40 mL IntraVENous Q8H   ??? sodium chloride (NS) flush 5-40 mL  5-40 mL IntraVENous PRN   ??? 0.9% sodium chloride infusion  75 mL/hr IntraVENous CONTINUOUS   ??? lacosamide (VIMPAT) 100 mg in 0.9% sodium chloride 100 mL IVPB  100 mg IntraVENous Q12H       DESCRIPTION OF PROCEDURE: Electrodes were applied in accordance with the international 10-20 system of electrode placement.  EEG was reviewed in both bipolar and referential montages     DESCRIPTION OF FINDINGS: Background consists of generalized theta activity.  Photic stimulation produces a poor occipital driving response.  No seizures noted.  Muscle movement artifact degraded the study.    IMPRESSION:  Readable portions of the study showed generalized slowing.  No seizures were noted.  Significant muscle artifact degraded the accuracy of the study.     Absence of seizures on this study does not exclude epilepsy. Clinical correlation is advised.
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
## 2018-01-01 NOTE — Progress Notes (Signed)
Patient's sister at bedside. Patient is being belligerent and getting up out of bed; not listening to RN or sister. Has pulled out her IV. Will call MD to get a sitter.

## 2018-01-01 NOTE — Progress Notes (Signed)
Off floor to EEG.

## 2018-01-01 NOTE — Progress Notes (Signed)
Hospitalist Progress Note  NAME: Meghan [name]   DOB:  02-02-1946   MRN:  [id number]       Assessment / Plan:  Acute encephalopathy, possibly post-ictal  --EEG obtained, read pending  --Neurology consultation requested -- concern for post-ictal state or possibly adverse effect of AEDs  --MRI and ammonia level ordered per Dr. Alcario Drought; appreciate input  --Recent TSH was within normal limits but will add to AM labs to recheck    History of seizures  --Continuing home medications at this time    Essential hypertension  --Continue lisinopril per home routine  --Hydralazine PRN    Mild cognitive impairment    Diabetes mellitus  --Continue glucose monitoring, thus far controlled but will add SSI    25.0 - 29.9 Overweight / Body mass index is 28.76 kg/m??.    Code status: Full  Prophylaxis: Lovenox  Recommended Disposition: Home w/Family     Subjective:     Chief Complaint / Reason for Physician Visit: follow up altered mental status  Unable to elicit complaints from patient. Sister at bedside states that patient is not at her mental status baseline, states that constantly repeating herself is not patient's norm. Patient also with some disruptive behaviors, removing IVs and BP cuffs at times. Sitter ordered. Discussed with RN events overnight.     Review of Systems:  Symptom Y/N Comments  Symptom Y/N Comments   Fever/Chills    Chest Pain     Poor Appetite    Edema     Cough    Abdominal Pain     Sputum    Joint Pain     SOB/DOE    Pruritis/Rash     Nausea/vomit    Tolerating PT/OT     Diarrhea    Tolerating Diet     Constipation    Other       Could NOT obtain due to: Patient not answering questions appropriately, mostly repeats questions     Objective:     VITALS:   Last 24hrs VS reviewed since prior progress note. Most recent are:  Patient Vitals for the past 24 hrs:   Temp Pulse Resp BP SpO2   01/01/18 1901 99.1 ??F (37.3 ??C) 90 18 142/72 97 %   01/01/18 1608 99.3 ??F (37.4 ??C) 93 16 144/81 95 %    01/01/18 1400 ??? 87 16 163/80 ???   01/01/18 1330 98.3 ??F (36.8 ??C) 94 21 162/88 ???   01/01/18 1300 ??? 93 20 154/80 ???   01/01/18 1250 ??? 92 16 161/73 ???   01/01/18 1156 ??? 93 27 150/71 ???   01/01/18 0704 98.3 ??F (36.8 ??C) 93 21 142/84 95 %   01/01/18 0600 ??? 84 23 108/62 94 %   01/01/18 0530 ??? 89 25 113/64 93 %   01/01/18 0500 ??? 84 20 116/53 94 %   01/01/18 0430 98.1 ??F (36.7 ??C) 87 22 108/61 94 %   01/01/18 0400 ??? 97 21 123/70 94 %   01/01/18 0300 97.8 ??F (36.6 ??C) 90 22 120/59 95 %   12/31/17 2300 98.1 ??F (36.7 ??C) 94 18 140/81 95 %     No intake or output data in the 24 hours ending 01/01/18 1905     PHYSICAL EXAM:  General: WD, WN. Alert, cooperative, no acute distress????  EENT:  EOMI. Anicteric sclerae. MMM  Resp:  CTA bilaterally, no wheezing or rales.  No accessory muscle use  CV:  Regular  rhythm,?? No edema  GI:  Soft, Non distended, Non tender. ??+Bowel sounds  Neurologic:?? Alert, oriented to self, "hospital," not date or purpose, does not follow commands but rather responds with "yes" when I ask her to do things or repeats her name/DOB, cannot assess cranial nerves and extremities as a result  Psych:???? Poor insight.??Not anxious nor agitated  Skin:  No rashes.  No jaundice    Reviewed most current lab test results and cultures  YES  Reviewed most current radiology test results   YES  Review and summation of old records today    NO  Reviewed patient's current orders and MAR    YES  PMH/SH reviewed - no change compared to H&P  ________________________________________________________________________  Care Plan discussed with:    Comments   Patient x    Family  x Sister   RN     Care Manager     Consultant                        Multidiciplinary team rounds were held today with case manager, nursing, pharmacist and Higher education careers adviser.  Patient's plan of care was discussed; medications were reviewed and discharge planning was addressed.     ________________________________________________________________________   Total NON critical care TIME:  25   Minutes    Total CRITICAL CARE TIME Spent:   Minutes non procedure based      Comments   >50% of visit spent in counseling and coordination of care     ________________________________________________________________________  Margie Ege, MD     Procedures: see electronic medical records for all procedures/Xrays and details which were not copied into this note but were reviewed prior to creation of Plan.      LABS:  I reviewed today's most current labs and imaging studies.  Pertinent labs include:  Recent Labs     01/01/18  0302 12/31/17  1530   WBC 12.5* 11.0   HGB 12.9 13.5   HCT 38.6 41.0   PLT 466* 456*     Recent Labs     01/01/18  0302 12/31/17  1530   NA 137 140   K 4.3 4.2   CL 106 103   CO2 26 28   GLU 104* 122*   BUN 15 15   CREA 0.99 1.08*   CA 9.1 9.4   ALB 3.2* 3.6   TBILI 0.5 0.4   SGOT 55* 40*   ALT 25 29       Signed: Margie Ege, MD

## 2018-01-01 NOTE — Progress Notes (Signed)
Attempted again to call report but no answer.

## 2018-01-01 NOTE — Progress Notes (Signed)
Order acknowledged. Patient to be seen later today for PT eval.    Savera Donson D Rhiana Morash, PT
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
## 2018-01-01 NOTE — Progress Notes (Signed)
Spoke with Dr. Lanigan. She is aware of patient's behavior. Orders received to have a sitter.
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
## 2018-01-01 NOTE — ED Notes (Signed)
TRANSFER - OUT REPORT:    Verbal report given to Alica,RN(name) on Meghan [name]  being transferred to ED (unit) for routine progression of care       Report consisted of patient???s Situation, Background, Assessment and   Recommendations(SBAR).     Information from the following report(s) SBAR was reviewed with the receiving nurse.    Lines:   Peripheral IV 01/01/18 Left Arm (Active)   Site Assessment Clean, dry, & intact 01/01/2018  3:59 AM   Dressing Status Clean, dry, & intact 01/01/2018  3:59 AM   Dressing Type Transparent 01/01/2018  3:59 AM        Opportunity for questions and clarification was provided.      Patient transported with:

## 2018-01-01 NOTE — Progress Notes (Signed)
Pt wt: 76kg  Crcl : 54ml/min    Per protocol, changed enoxaparin to 40mg subcutaneously every 24hr for wt > 60kg and crcl > 30ml/min.    Aric Anderson, PHARMD

## 2018-01-01 NOTE — Progress Notes (Signed)
Attempted to call report.

## 2018-01-01 NOTE — Progress Notes (Signed)
Spoke with Dr. Doy Hutching. She is aware of patient's behavior. Orders received to have a sitter.
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
## 2018-01-01 NOTE — Progress Notes (Signed)
Pt wt: 76kg  Crcl : 71ml/min    Per protocol, changed enoxaparin to  subcutaneously every 24hr for wt > 60kg and crcl > 62ml/min.    Silver Huguenin, [facility]

## 2018-01-01 NOTE — Procedures (Signed)
[facility]   [address]   [place], [place] [address]      Electroencephalogram         Procedure Date: 01/01/2018    Procedure ID: [id number]    Procedure Type: Routine    Patient Name: Meghan [name]     Date of Birth: 06/16/1946    Medical Record No: [id number]    INDICATION: Altered mental status    Medications:  Current Facility-Administered Medications   Medication Dose Route Frequency   ??? citalopram (CELEXA) tablet 20 mg  20 mg Oral DAILY   ??? donepezil (ARICEPT) tablet 10 mg  10 mg Oral QHS   ??? enoxaparin (LOVENOX) injection 40 mg  40 mg SubCUTAneous Q24H   ??? hydrALAZINE (APRESOLINE) tablet 25 mg  25 mg Oral TID PRN   ??? glucose chewable tablet 16 g  4 Tab Oral PRN   ??? dextrose (D50W) injection syrg 12.5-25 g  25-50 mL IntraVENous PRN   ??? glucagon (GLUCAGEN) injection 1 mg  1 mg IntraMUSCular PRN   ??? insulin lispro (HUMALOG) injection   SubCUTAneous AC&HS   ??? aspirin chewable tablet 81 mg  81 mg Oral DAILY   ??? divalproex DR (DEPAKOTE) tablet 500 mg  500 mg Oral Q12H   ??? fenofibrate nanocrystallized (TRICOR) tablet 48 mg  48 mg Oral DAILY   ??? fluticasone propionate (FLONASE) 50 mcg/actuation nasal spray 1 Spray  1 Spray Both Nostrils DAILY   ??? lactulose (CHRONULAC) 10 gram/15 mL solution 5 mL  5 mL Oral TID   ??? lisinopril (PRINIVIL, ZESTRIL) tablet 10 mg  10 mg Oral DAILY   ??? methylphenidate HCl (RITALIN) tablet 5 mg  5 mg Oral DAILY   ??? LORazepam (ATIVAN) injection 1 mg  1 mg IntraVENous Q4H PRN   ??? sodium chloride (NS) flush 5-40 mL  5-40 mL IntraVENous Q8H   ??? sodium chloride (NS) flush 5-40 mL  5-40 mL IntraVENous PRN   ??? 0.9% sodium chloride infusion  75 mL/hr IntraVENous CONTINUOUS   ??? lacosamide (VIMPAT) 100 mg in 0.9% sodium chloride 100 mL IVPB  100 mg IntraVENous Q12H       DESCRIPTION OF PROCEDURE: Electrodes were applied in accordance with the international 10-20 system of electrode placement.  EEG was reviewed in both bipolar and referential  montages    DESCRIPTION OF FINDINGS: Background consists of generalized theta activity.  Photic stimulation produces a poor occipital driving response.  No seizures noted.  Muscle movement artifact degraded the study.    IMPRESSION:  Readable portions of the study showed generalized slowing.  No seizures were noted.  Significant muscle artifact degraded the accuracy of the study.     Absence of seizures on this study does not exclude epilepsy. Clinical correlation is advised.

## 2018-01-01 NOTE — Progress Notes (Signed)
Off floor to EEG.

## 2018-01-01 NOTE — Progress Notes (Signed)
Progress Notes by Margie Ege, MD at 01/01/18 1300                Author: Margie Ege, MD  Service: Internal Medicine  Author Type: Physician       Filed: 01/01/18 1913  Date of Service: 01/01/18 1300  Status: Signed          Editor: Margie Ege, MD (Physician)                       Hospitalist Progress Note      NAME: Meghan [name]    DOB:  05/14/1946    MRN:  [id number]          Assessment / Plan:     Acute encephalopathy, possibly post-ictal   --EEG obtained, read pending   --Neurology consultation requested -- concern for post-ictal state or possibly adverse effect of AEDs   --MRI and ammonia level ordered per Dr. Alcario Drought; appreciate input   --Recent TSH was within normal limits but will add to AM labs to recheck      History of seizures   --Continuing home medications at this time      Essential hypertension   --Continue lisinopril per home routine   --Hydralazine PRN      Mild cognitive impairment      Diabetes mellitus   --Continue glucose monitoring, thus far controlled but will add SSI      25.0 - 29.9 Overweight  / Body mass index is 28.76 kg/m??.      Code status: Full   Prophylaxis:  Lovenox   Recommended Disposition : Home w/Family          Subjective:        Chief Complaint / Reason for Physician Visit: follow up altered mental status   Unable to elicit complaints from patient. Sister at bedside states that patient is not at her mental status baseline, states that constantly repeating  herself is not patient's norm. Patient also with some disruptive behaviors, removing IVs and BP cuffs at times. Sitter ordered. Discussed with RN events overnight.       Review of Systems:           Symptom  Y/N  Comments    Symptom  Y/N  Comments             Fever/Chills        Chest Pain                 Poor Appetite        Edema         Cough        Abdominal Pain                 Sputum        Joint Pain         SOB/DOE        Pruritis/Rash         Nausea/vomit        Tolerating PT/OT          Diarrhea        Tolerating Diet         Constipation        Other               Could NOT obtain due to:  Patient not answering questions appropriately, mostly repeats questions          Objective:  VITALS:    Last 24hrs VS reviewed since prior progress note. Most recent are:   Patient Vitals for the past 24 hrs:            Temp  Pulse  Resp  BP  SpO2            01/01/18 1901  99.1 ??F (37.3 ??C)  90  18  142/72  97 %            01/01/18 1608  99.3 ??F (37.4 ??C)  93  16  144/81  95 %     01/01/18 1400  --  87  16  163/80  --     01/01/18 1330  98.3 ??F (36.8 ??C)  94  21  162/88  --     01/01/18 1300  --  93  20  154/80  --     01/01/18 1250  --  92  16  161/73  --     01/01/18 1156  --  93  27  150/71  --     01/01/18 0704  98.3 ??F (36.8 ??C)  93  21  142/84  95 %     01/01/18 0600  --  84  23  108/62  94 %     01/01/18 0530  --  89  25  113/64  93 %     01/01/18 0500  --  84  20  116/53  94 %     01/01/18 0430  98.1 ??F (36.7 ??C)  87  22  108/61  94 %     01/01/18 0400  --  97  21  123/70  94 %     01/01/18 0300  97.8 ??F (36.6 ??C)  90  22  120/59  95 %            12/31/17 2300  98.1 ??F (36.7 ??C)  94  18  140/81  95 %        No intake or output data in the 24 hours ending 01/01/18 1905       PHYSICAL EXAM:   General: WD, WN. Alert, cooperative, no acute distress????   EENT:  EOMI. Anicteric sclerae. MMM   Resp:  CTA bilaterally, no wheezing or rales.  No accessory muscle use   CV:  Regular  rhythm,?? No edema   GI:  Soft, Non distended, Non tender. ??+Bowel sounds   Neurologic:?? Alert, oriented to self, "hospital," not date or purpose, does not follow commands but rather responds with "yes" when I ask her to do things or repeats her name/DOB,  cannot assess cranial nerves and extremities as a result   Psych:???? Poor insight.??Not anxious nor agitated   Skin:  No rashes.  No jaundice      Reviewed most current lab test results and cultures  YES   Reviewed most current radiology test results   YES   Review and summation of  old records today    NO   Reviewed patient's current orders and MAR    YES   PMH/SH reviewed - no change compared  to H&P   ________________________________________________________________________   Care Plan discussed with:           Comments         Patient  x           Family   x  Sister         RN  Care Manager             Consultant                              Multidiciplinary team rounds were held today with case manager, nursing, pharmacist and clinical coordinator.  Patient's plan of care was discussed;  medications were reviewed and discharge planning was addressed.       ________________________________________________________________________   Total NON critical care TIME:  25   Minutes      Total CRITICAL CARE TIME Spent:   Minutes non procedure based              Comments         >50% of visit spent in counseling and coordination of care         ________________________________________________________________________   Margie Ege, MD       Procedures: see electronic medical records for all procedures/Xrays and details which were not copied into this note but were reviewed prior to creation of Plan.        LABS:   I reviewed today's most current labs and imaging studies.   Pertinent labs include:     Recent Labs            01/01/18   0302  12/31/17   1530     WBC  12.5*  11.0     HGB  12.9  13.5     HCT  38.6  41.0         PLT  466*  456*          Recent Labs            01/01/18   0302  12/31/17   1530     NA  137  140     K  4.3  4.2     CL  106  103     CO2  26  28     GLU  104*  122*     BUN  15  15     CREA  0.99  1.08*     CA  9.1  9.4     ALB  3.2*  3.6     TBILI  0.5  0.4     SGOT  55*  40*         ALT  25  29           Signed: Margie Ege, MD

## 2018-01-01 NOTE — Progress Notes (Signed)
Report received from [place], [place].

## 2018-01-01 NOTE — Progress Notes (Signed)
Chart reviewed. Spoke with patients nurse. Patient is off the floor for tests at this time.  Will check back later today.    Alba Cory, PT

## 2018-01-01 NOTE — Progress Notes (Signed)
Order acknowledged. Patient to be seen later today for PT eval.    John D Shields, PT

## 2018-01-01 NOTE — Progress Notes (Signed)
Attempted again to call report but no answer.

## 2018-01-01 NOTE — ED Notes (Signed)
 TRANSFER - OUT REPORT:    Verbal report given to Alica,RN(name) on Meghan [name]  being transferred to ED (unit) for routine progression of care       Report consisted of patient's Situation, Background, Assessment and   Recommendations(SBAR).     Information from the following report(s) SBAR was reviewed with the receiving nurse.    Lines:   Peripheral IV 01/01/18 Left Arm (Active)   Site Assessment Clean, dry, & intact 01/01/2018  3:59 AM   Dressing Status Clean, dry, & intact 01/01/2018  3:59 AM   Dressing Type Transparent 01/01/2018  3:59 AM        Opportunity for questions and clarification was provided.      Patient transported with:
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
## 2018-01-01 NOTE — Progress Notes (Signed)
Patient's sister at bedside. Patient is being belligerent and getting up out of bed; not listening to RN or sister. Has pulled out her IV. Will call MD to get a sitter.

## 2018-01-02 ENCOUNTER — Encounter: Attending: Neurology | Primary: Internal Medicine

## 2018-01-02 ENCOUNTER — Inpatient Hospital Stay: Admit: 2018-01-02 | Payer: MEDICARE | Primary: Internal Medicine

## 2018-01-02 LAB — METABOLIC PANEL, COMPREHENSIVE
A-G Ratio: 0.9 — ABNORMAL LOW (ref 1.1–2.2)
ALT (SGPT): 45 U/L (ref 12–78)
AST (SGOT): 104 U/L — ABNORMAL HIGH (ref 15–37)
Albumin: 3.6 g/dL (ref 3.5–5.0)
Alk. phosphatase: 50 U/L (ref 45–117)
Anion gap: 10 mmol/L (ref 5–15)
BUN/Creatinine ratio: 12 (ref 12–20)
BUN: 12 MG/DL (ref 6–20)
Bilirubin, total: 0.8 MG/DL (ref 0.2–1.0)
CO2: 25 mmol/L (ref 21–32)
Calcium: 9.2 MG/DL (ref 8.5–10.1)
Chloride: 105 mmol/L (ref 97–108)
Creatinine: 0.98 MG/DL (ref 0.55–1.02)
GFR est AA: >60 mL/min/{1.73_m2} (ref >60)
GFR est non-AA: 56 mL/min/{1.73_m2} — ABNORMAL LOW (ref >60)
Globulin: 3.8 g/dL (ref 2.0–4.0)
Glucose: 87 mg/dL (ref 65–100)
Potassium: 4.1 mmol/L (ref 3.5–5.1)
Protein, total: 7.4 g/dL (ref 6.4–8.2)
Sodium: 140 mmol/L (ref 136–145)

## 2018-01-02 LAB — CBC WITH AUTOMATED DIFF
ABS. BASOPHILS: 0.1 10*3/uL (ref 0.0–0.1)
ABS. EOSINOPHILS: 0.1 10*3/uL (ref 0.0–0.4)
ABS. IMM. GRANS.: 0 10*3/uL (ref 0.00–0.04)
ABS. LYMPHOCYTES: 2.7 10*3/uL (ref 0.8–3.5)
ABS. MONOCYTES: 1 10*3/uL (ref 0.0–1.0)
ABS. NEUTROPHILS: 4.6 10*3/uL (ref 1.8–8.0)
ABSOLUTE NRBC: 0 10*3/uL (ref 0.00–0.01)
BASOPHILS: 1 % (ref 0–1)
EOSINOPHILS: 1 % (ref 0–7)
HCT: 42.7 % (ref 35.0–47.0)
HGB: 14.2 g/dL (ref 11.5–16.0)
IMMATURE GRANULOCYTES: 0 % (ref 0.0–0.5)
LYMPHOCYTES: 32 % (ref 12–49)
MCH: 30.9 PG (ref 26.0–34.0)
MCHC: 33.3 g/dL (ref 30.0–36.5)
MCV: 93 FL (ref 80.0–99.0)
MONOCYTES: 12 % (ref 5–13)
MPV: 10.1 FL (ref 8.9–12.9)
NEUTROPHILS: 54 % (ref 32–75)
NRBC: 0 PER 100 WBC
PLATELET: 424 10*3/uL — ABNORMAL HIGH (ref 150–400)
RBC: 4.59 M/uL (ref 3.80–5.20)
RDW: 14.6 % — ABNORMAL HIGH (ref 11.5–14.5)
WBC: 8.4 10*3/uL (ref 3.6–11.0)

## 2018-01-02 LAB — GLUCOSE, POC
Glucose (POC): 95 mg/dL (ref 65–100)
Glucose (POC): 99 mg/dL (ref 65–100)
Glucose (POC): 81 mg/dL (ref 65–100)
Glucose (POC): 99 mg/dL (ref 65–100)

## 2018-01-02 LAB — VITAMIN B12
Vitamin B-12: 1276 pg/mL — ABNORMAL HIGH (ref 193–986)
Vitamin B12: 1276 pg/mL — ABNORMAL HIGH (ref 193–986)

## 2018-01-02 LAB — TSH 3RD GENERATION
TSH: 1.16 u[IU]/mL (ref 0.36–3.74)
TSH: 1.16 u[IU]/mL (ref 0.36–3.74)

## 2018-01-02 LAB — CK
CK: 339 U/L — ABNORMAL HIGH (ref 26–192)
Total CK: 339 U/L — ABNORMAL HIGH (ref 26–192)

## 2018-01-02 LAB — FOLATE
Folate: 32.1 ng/mL — ABNORMAL HIGH (ref 5.0–21.0)
Folate: 32.1 ng/mL — ABNORMAL HIGH (ref 5.0–21.0)

## 2018-01-02 LAB — AMMONIA
Ammonia, plasma: <10 umol/L (ref <32)
Ammonia: <10 umol/L (ref <32)

## 2018-01-02 LAB — POCT GLUCOSE
POC Glucose: 95 mg/dL (ref 65–100)
POC Glucose: 99 mg/dL (ref 65–100)
POC Glucose: 81 mg/dL (ref 65–100)
POC Glucose: 99 mg/dL (ref 65–100)

## 2018-01-02 LAB — CBC WITH AUTO DIFFERENTIAL
Basophils %: 1 % (ref 0–1)
Basophils Absolute: 0.1 10*3/uL (ref 0.0–0.1)
Eosinophils %: 1 % (ref 0–7)
Eosinophils Absolute: 0.1 10*3/uL (ref 0.0–0.4)
Granulocyte Absolute Count: 0 10*3/uL (ref 0.00–0.04)
Hematocrit: 42.7 % (ref 35.0–47.0)
Hemoglobin: 14.2 g/dL (ref 11.5–16.0)
Immature Granulocytes: 0 % (ref 0.0–0.5)
Lymphocytes %: 32 % (ref 12–49)
Lymphocytes Absolute: 2.7 10*3/uL (ref 0.8–3.5)
MCH: 30.9 PG (ref 26.0–34.0)
MCHC: 33.3 g/dL (ref 30.0–36.5)
MCV: 93 FL (ref 80.0–99.0)
MPV: 10.1 FL (ref 8.9–12.9)
Monocytes %: 12 % (ref 5–13)
Monocytes Absolute: 1 10*3/uL (ref 0.0–1.0)
NRBC Absolute: 0 10*3/uL (ref 0.00–0.01)
Neutrophils %: 54 % (ref 32–75)
Neutrophils Absolute: 4.6 10*3/uL (ref 1.8–8.0)
Nucleated RBCs: 0 PER 100 WBC
Platelets: 424 10*3/uL — ABNORMAL HIGH (ref 150–400)
RBC: 4.59 M/uL (ref 3.80–5.20)
RDW: 14.6 % — ABNORMAL HIGH (ref 11.5–14.5)
WBC: 8.4 10*3/uL (ref 3.6–11.0)

## 2018-01-02 LAB — COMPREHENSIVE METABOLIC PANEL
ALT: 45 U/L (ref 12–78)
AST: 104 U/L — ABNORMAL HIGH (ref 15–37)
Albumin/Globulin Ratio: 0.9 — ABNORMAL LOW (ref 1.1–2.2)
Albumin: 3.6 g/dL (ref 3.5–5.0)
Alkaline Phosphatase: 50 U/L (ref 45–117)
Anion Gap: 10 mmol/L (ref 5–15)
BUN: 12 MG/DL (ref 6–20)
Bun/Cre Ratio: 12 (ref 12–20)
CO2: 25 mmol/L (ref 21–32)
Calcium: 9.2 MG/DL (ref 8.5–10.1)
Chloride: 105 mmol/L (ref 97–108)
Creatinine: 0.98 MG/DL (ref 0.55–1.02)
EGFR IF NonAfrican American: 56 mL/min/{1.73_m2} — ABNORMAL LOW (ref >60)
GFR African American: >60 mL/min/{1.73_m2} (ref >60)
Globulin: 3.8 g/dL (ref 2.0–4.0)
Glucose: 87 mg/dL (ref 65–100)
Potassium: 4.1 mmol/L (ref 3.5–5.1)
Sodium: 140 mmol/L (ref 136–145)
Total Bilirubin: 0.8 MG/DL (ref 0.2–1.0)
Total Protein: 7.4 g/dL (ref 6.4–8.2)

## 2018-01-02 MED ORDER — LORAZEPAM 2 MG/ML IJ SOLN
2 mg/mL | INTRAMUSCULAR | Status: AC
Start: 2018-01-02 — End: 2018-01-02
  Administered 2018-01-02: 23:00:00 via INTRAVENOUS

## 2018-01-02 MED ORDER — DONEPEZIL 5 MG TAB
5 mg | Freq: Every evening | ORAL | Status: DC
Start: 2018-01-02 — End: 2018-01-03
  Administered 2018-01-03: 02:00:00 via ORAL

## 2018-01-02 MED ORDER — CITALOPRAM 20 MG TAB
20 mg | Freq: Every day | ORAL | Status: DC
Start: 2018-01-02 — End: 2018-01-03
  Administered 2018-01-02 – 2018-01-03 (×2): via ORAL

## 2018-01-02 MED FILL — ENOXAPARIN 40 MG/0.4 ML SUB-Q SYRINGE: 40 mg/0.4 mL | SUBCUTANEOUS | Qty: 0.4

## 2018-01-02 MED FILL — METHYLPHENIDATE 5 MG TAB: 5 mg | ORAL | Qty: 1

## 2018-01-02 MED FILL — ARICEPT 5 MG TABLET: 5 mg | ORAL | Qty: 2

## 2018-01-02 MED FILL — DEPAKOTE 250 MG TABLET,DELAYED RELEASE: 250 mg | ORAL | Qty: 2

## 2018-01-02 MED FILL — LACTULOSE 20 GRAM/30 ML ORAL SOLUTION: 20 gram/30 mL | ORAL | Qty: 30

## 2018-01-02 MED FILL — CITALOPRAM 20 MG TAB: 20 mg | ORAL | Qty: 1

## 2018-01-02 MED FILL — CHILDREN'S ASPIRIN 81 MG CHEWABLE TABLET: 81 mg | ORAL | Qty: 1

## 2018-01-02 MED FILL — TRICOR 48 MG TABLET: 48 mg | ORAL | Qty: 1

## 2018-01-02 MED FILL — BD POSIFLUSH NORMAL SALINE 0.9 % INJECTION SYRINGE: INTRAMUSCULAR | Qty: 10

## 2018-01-02 MED FILL — VIMPAT 200 MG/20 ML INTRAVENOUS SOLUTION: 200 mg/20 mL | INTRAVENOUS | Qty: 20

## 2018-01-02 MED FILL — LISINOPRIL 5 MG TAB: 5 mg | ORAL | Qty: 2

## 2018-01-02 MED FILL — VIMPAT 200 MG/20 ML INTRAVENOUS SOLUTION: 200 mg/20 mL | INTRAVENOUS | Qty: 10

## 2018-01-02 MED FILL — LORAZEPAM 2 MG/ML IJ SOLN: 2 mg/mL | INTRAMUSCULAR | Qty: 1
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
## 2018-01-02 NOTE — Progress Notes (Signed)
Midline insertion procedure note:  Pt has very limited vascular access. Procedure explained to patient along with risks and benefits. Procedure teaching completed. Pre procedure assessment done. Patient denies questions or concerns at this time.  Midline information booklet given to patient.   Midline sign over the HOB.    Maximum sterile barrier precautions observed throughout procedure.   Lidocaine 1% 3ml sc injected to site prior to access the vein.  Cannulated brachial vein using ultrasound guidance.    Inserted 4 Fr. single lumen midline to Left arm.   Blood return verified and flushed with 20ml normal saline.  Sterile dressing applied with Biopatch, StatLock and occlusive dressing as per protocol. Curos caps applied to port.   Patient tolerated procedure well with minimal blood loss.   Reason for access : Reliable IV access  Complications related to insertion : None  This midline to be removed on or before 01/31/2018  See nursing message on Kardex for midline reminders.  Midline is CT and MRI compatible.  Inserted by : Kearra Calkin, RN [facility] / Vascular Access Nurse  Assisted by : Tiffany Provo,RN, [facility] / Vascular Access Nurse  Catheter Length : 10 cm   External Length : 0cm   Left arm circumference : 29 cm  Catheter occupies 14% of vein.  Type of Midline: BARD  Ref#: [id number]  Lot#:[id number]  Expiration Date: 03/14/2019  Informed primary nurse Trisha, RN midline is ready for use and to hang new infusion tubing prior to use.    Zamariya Neal, RN, [facility]  Vascular Access Nurse
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
## 2018-01-02 NOTE — Other (Signed)
MRI NOTE    MRI screening sheet needs to be completed and signed before the MRI can be performed.    Please call [phone]  When this is done

## 2018-01-02 NOTE — Progress Notes (Addendum)
Hospitalist Progress Note  NAME: Meghan [name]   DOB:  1945/11/28   MRN:  [id number]       Assessment / Plan:  Acute encephalopathy, possibly post-ictal- Improving/ but still confused  --EEG obtained, read pending  -MRI Pending  --Today 5/22, aao to person,place. Able to continue conversations.  -Neurology on Board  -will add Vitamin b12 and Folic acid    History of seizures  --Continuing home medications at this time  No more seizures  PRN meds if needed    Essential hypertension  --Continue lisinopril per home routine  --Hydralazine PRN    Mild cognitive impairment    Diabetes mellitus  --Continue glucose monitoring, thus far controlled but will add SSI    Depression  Continue Home celexa    25.0 - 29.9 Overweight / Body mass index is 28.76 kg/m??.    Code status: Full  Prophylaxis: Lovenox  Recommended Disposition: Home w/Family     Subjective:     Chief Complaint / Reason for Physician Visit: follow up altered mental status    Says she wants to go home.  Aaox2.   On asking who is president? She says she cant tell me that as she did not tell nurse earlier, as she cant remember.      Review of Systems:  Symptom Y/N Comments  Symptom Y/N Comments   Fever/Chills n   Chest Pain n    Poor Appetite n   Edema n    Cough n   Abdominal Pain n    Sputum n   Joint Pain     SOB/DOE n   Pruritis/Rash     Nausea/vomit n   Tolerating PT/OT     Diarrhea    Tolerating Diet     Constipation    Other       Could NOT obtain due to:      Objective:     VITALS:   Last 24hrs VS reviewed since prior progress note. Most recent are:  Patient Vitals for the past 24 hrs:   Temp Pulse Resp BP SpO2   01/02/18 0420 98.4 ??F (36.9 ??C) 83 18 145/73 100 %   01/01/18 2327 98.4 ??F (36.9 ??C) 84 18 141/75 99 %   01/01/18 1901 99.1 ??F (37.3 ??C) 90 18 142/72 97 %   01/01/18 1608 99.3 ??F (37.4 ??C) 93 16 144/81 95 %   01/01/18 1400 ??? 87 16 163/80 ???   01/01/18 1330 98.3 ??F (36.8 ??C) 94 21 162/88 ???   01/01/18 1300 ??? 93 20 154/80 ???    01/01/18 1250 ??? 92 16 161/73 ???   01/01/18 1156 ??? 93 27 150/71 ???       Intake/Output Summary (Last 24 hours) at 01/02/2018 0734  Last data filed at 01/02/2018 0527  Gross per 24 hour   Intake ???   Output 300 ml   Net -300 ml        PHYSICAL EXAM:  General: WD, WN. Alert, cooperative, no acute distress????  EENT:  EOMI. Anicteric sclerae. MMM  Resp:  CTA bilaterally, no wheezing or rales.  No accessory muscle use  CV:  Regular  rhythm,?? No edema  GI:  Soft, Non distended, Non tender. ??+Bowel sounds  Neurologic:?? Follows command appropiately, aaox2. No nuchal rigidity. No focal deficits.     Psych:???? Poor insight.??Not anxious nor agitated  Skin:  No rashes.  No jaundice    Reviewed most current lab test results and cultures  YES  Reviewed most current radiology test results   YES  Review and summation of old records today    NO  Reviewed patient's current orders and MAR    YES  PMH/SH reviewed - no change compared to H&P  ________________________________________________________________________  Care Plan discussed with:    Comments   Patient x    Family      RN     Care Manager     Consultant                        Multidiciplinary team rounds were held today with case manager, nursing, pharmacist and clinical coordinator.  Patient's plan of care was discussed; medications were reviewed and discharge planning was addressed.     ________________________________________________________________________  Total NON critical care TIME:  25   Minutes    Total CRITICAL CARE TIME Spent:   Minutes non procedure based      Comments   >50% of visit spent in counseling and coordination of care     ________________________________________________________________________  Leota Jacobsen, MD     Procedures: see electronic medical records for all procedures/Xrays and details which were not copied into this note but were reviewed prior to creation of Plan.      LABS:  I reviewed today's most current labs and imaging studies.   Pertinent labs include:  Recent Labs     01/01/18  0302 12/31/17  1530   WBC 12.5* 11.0   HGB 12.9 13.5   HCT 38.6 41.0   PLT 466* 456*     Recent Labs     01/01/18  0302 12/31/17  1530   NA 137 140   K 4.3 4.2   CL 106 103   CO2 26 28   GLU 104* 122*   BUN 15 15   CREA 0.99 1.08*   CA 9.1 9.4   ALB 3.2* 3.6   TBILI 0.5 0.4   SGOT 55* 40*   ALT 25 29       Signed: Leota Jacobsen, MD
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
## 2018-01-02 NOTE — Progress Notes (Signed)
Progress Notes by Leota Jacobsen, MD at 01/02/18 [phone]                Author: Leota Jacobsen, MD  Service: Internal Medicine  Author Type: Physician       Filed: 01/02/18 0739  Date of Service: 01/02/18 0734  Status: Addendum          Editor: Leota Jacobsen, MD (Physician)          Related Notes: Original Note by Leota Jacobsen, MD (Physician) filed at 01/02/18 [phone]                       Hospitalist Progress Note      NAME: Meghan [name]    DOB:  1945-09-02    MRN:  [id number]          Assessment / Plan:     Acute encephalopathy, possibly post-ictal- Improving/ but still confused   --EEG obtained, read pending   -MRI Pending   --Today 5/22, aao to person,place. Able to continue conversations.   -Neurology on Board   -will add Vitamin b12 and Folic acid      History of seizures   --Continuing home medications at this time   No more seizures   PRN meds if needed      Essential hypertension   --Continue lisinopril per home routine   --Hydralazine PRN      Mild cognitive impairment      Diabetes mellitus   --Continue glucose monitoring, thus far controlled but will add SSI      Depression   Continue Home celexa      25.0 - 29.9 Overweight  / Body mass index is 28.76 kg/m??.      Code status: Full   Prophylaxis:  Lovenox   Recommended Disposition : Home w/Family          Subjective:        Chief Complaint / Reason for Physician Visit: follow up altered mental status      Says she wants to go home.   Aaox2.    On asking who is president? She says she cant tell me that as she did not tell nurse earlier, as she cant remember.         Review of Systems:           Symptom  Y/N  Comments    Symptom  Y/N  Comments             Fever/Chills  n      Chest Pain  n               Poor Appetite  n      Edema  n       Cough  n      Abdominal Pain  n               Sputum  n      Joint Pain         SOB/DOE  n      Pruritis/Rash         Nausea/vomit  n      Tolerating PT/OT         Diarrhea        Tolerating Diet          Constipation        Other               Could  NOT obtain due to:            Objective:        VITALS:    Last 24hrs VS reviewed since prior progress note. Most recent are:   Patient Vitals for the past 24 hrs:            Temp  Pulse  Resp  BP  SpO2            01/02/18 0420  98.4 ??F (36.9 ??C)  83  18  145/73  100 %            01/01/18 2327  98.4 ??F (36.9 ??C)  84  18  141/75  99 %     01/01/18 1901  99.1 ??F (37.3 ??C)  90  18  142/72  97 %     01/01/18 1608  99.3 ??F (37.4 ??C)  93  16  144/81  95 %     01/01/18 1400  --  87  16  163/80  --     01/01/18 1330  98.3 ??F (36.8 ??C)  94  21  162/88  --     01/01/18 1300  --  93  20  154/80  --     01/01/18 1250  --  92  16  161/73  --            01/01/18 1156  --  93  27  150/71  --           Intake/Output Summary (Last 24 hours) at 01/02/2018 0734   Last data filed at 01/02/2018 0527     Gross per 24 hour        Intake  --        Output  300 ml        Net  -300 ml            PHYSICAL EXAM:   General: WD, WN. Alert, cooperative, no acute distress????   EENT:  EOMI. Anicteric sclerae. MMM   Resp:  CTA bilaterally, no wheezing or rales.  No accessory muscle use   CV:  Regular  rhythm,?? No edema   GI:  Soft, Non distended, Non tender. ??+Bowel sounds   Neurologic:?? Follows command appropiately, aaox2. No nuchal rigidity. No focal deficits.       Psych:???? Poor insight.??Not anxious nor agitated   Skin:  No rashes.  No jaundice      Reviewed most current lab test results and cultures  YES   Reviewed most current radiology test results   YES   Review and summation of old records today    NO   Reviewed patient's current orders and MAR    YES   PMH/SH reviewed - no change compared  to H&P   ________________________________________________________________________   Care Plan discussed with:           Comments         Patient  x           Family              RN         Care Manager             Consultant                              Multidiciplinary team rounds were held today with case  manager, nursing, pharmacist and  clinical coordinator.  Patient's plan of care was discussed;  medications were reviewed and discharge planning was addressed.       ________________________________________________________________________   Total NON critical care TIME:  25   Minutes      Total CRITICAL CARE TIME Spent:   Minutes non procedure based              Comments         >50% of visit spent in counseling and coordination of care         ________________________________________________________________________   Leota Jacobsen, MD       Procedures: see electronic medical records for all procedures/Xrays and details which were not copied into this note but were reviewed prior to creation of Plan.        LABS:   I reviewed today's most current labs and imaging studies.   Pertinent labs include:     Recent Labs            01/01/18   0302  12/31/17   1530     WBC  12.5*  11.0     HGB  12.9  13.5     HCT  38.6  41.0         PLT  466*  456*          Recent Labs            01/01/18   0302  12/31/17   1530     NA  137  140     K  4.3  4.2     CL  106  103     CO2  26  28     GLU  104*  122*     BUN  15  15     CREA  0.99  1.08*     CA  9.1  9.4     ALB  3.2*  3.6     TBILI  0.5  0.4     SGOT  55*  40*         ALT  25  29           Signed: Leota Jacobsen, MD
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
## 2018-01-02 NOTE — Progress Notes (Signed)
Midline insertion procedure note:  Pt has very limited vascular access. Procedure explained to patient along with risks and benefits. Procedure teaching completed. Pre procedure assessment done. Patient denies questions or concerns at this time.  Midline information booklet given to patient.   Midline sign over the [facility].    Maximum sterile barrier precautions observed throughout procedure.   Lidocaine 1% 3ml sc injected to site prior to access the vein.  Cannulated brachial vein using ultrasound guidance.    Inserted 4 Fr. single lumen midline to Left arm.   Blood return verified and flushed with 20ml normal saline.  Sterile dressing applied with Biopatch, StatLock and occlusive dressing as per protocol. Curos caps applied to port.   Patient tolerated procedure well with minimal blood loss.   Reason for access : Reliable IV access  Complications related to insertion : None  This midline to be removed on or before 01/31/2018  See nursing message on Kardex for midline reminders.  Midline is CT and MRI compatible.  Inserted by : Sherryle Lis, RN [facility] / Vascular Access Nurse  Assisted by : Tanya Nones, [facility] / Vascular Access Nurse  Catheter Length : 10 cm   External Length : 0cm   Left arm circumference : 29 cm  Catheter occupies 14% of vein.  Type of Midline: BARD  Ref#: [id number]  Lot#:[id number]  Expiration Date: 03/14/2019  Informed primary nurse Bethann Berkshire, RN midline is ready for use and to hang new infusion tubing prior to use.    Sherryle Lis, RN, [facility]  Vascular Access Nurse

## 2018-01-03 LAB — CBC WITH AUTOMATED DIFF
ABS. BASOPHILS: 0 10*3/uL (ref 0.0–0.1)
ABS. EOSINOPHILS: 0.2 10*3/uL (ref 0.0–0.4)
ABS. IMM. GRANS.: 0 10*3/uL (ref 0.00–0.04)
ABS. LYMPHOCYTES: 2.2 10*3/uL (ref 0.8–3.5)
ABS. MONOCYTES: 0.9 10*3/uL (ref 0.0–1.0)
ABS. NEUTROPHILS: 2.9 10*3/uL (ref 1.8–8.0)
ABSOLUTE NRBC: 0 10*3/uL (ref 0.00–0.01)
BASOPHILS: 1 % (ref 0–1)
EOSINOPHILS: 3 % (ref 0–7)
HCT: 35.2 % (ref 35.0–47.0)
HGB: 11.4 g/dL — ABNORMAL LOW (ref 11.5–16.0)
IMMATURE GRANULOCYTES: 0 % (ref 0.0–0.5)
LYMPHOCYTES: 35 % (ref 12–49)
MCH: 30.5 PG (ref 26.0–34.0)
MCHC: 32.4 g/dL (ref 30.0–36.5)
MCV: 94.1 FL (ref 80.0–99.0)
MONOCYTES: 15 % — ABNORMAL HIGH (ref 5–13)
MPV: 10.6 FL (ref 8.9–12.9)
NEUTROPHILS: 47 % (ref 32–75)
NRBC: 0 PER 100 WBC
PLATELET: 325 10*3/uL (ref 150–400)
RBC: 3.74 M/uL — ABNORMAL LOW (ref 3.80–5.20)
RDW: 14.6 % — ABNORMAL HIGH (ref 11.5–14.5)
WBC: 6.3 10*3/uL (ref 3.6–11.0)

## 2018-01-03 LAB — METABOLIC PANEL, BASIC
Anion gap: 8 mmol/L (ref 5–15)
BUN/Creatinine ratio: 15 (ref 12–20)
BUN: 15 MG/DL (ref 6–20)
CO2: 27 mmol/L (ref 21–32)
Calcium: 8.8 MG/DL (ref 8.5–10.1)
Chloride: 109 mmol/L — ABNORMAL HIGH (ref 97–108)
Creatinine: 1.01 MG/DL (ref 0.55–1.02)
GFR est AA: >60 mL/min/{1.73_m2} (ref >60)
GFR est non-AA: 54 mL/min/{1.73_m2} — ABNORMAL LOW (ref >60)
Glucose: 73 mg/dL (ref 65–100)
Potassium: 3.7 mmol/L (ref 3.5–5.1)
Sodium: 144 mmol/L (ref 136–145)

## 2018-01-03 LAB — GLUCOSE, POC
Glucose (POC): 77 mg/dL (ref 65–100)
Glucose (POC): 135 mg/dL — ABNORMAL HIGH (ref 65–100)
Glucose (POC): 75 mg/dL (ref 65–100)
Glucose (POC): 79 mg/dL (ref 65–100)
Glucose (POC): 88 mg/dL (ref 65–100)
Glucose (POC): 102 mg/dL — ABNORMAL HIGH (ref 65–100)
Glucose (POC): 79 mg/dL (ref 65–100)
Glucose (POC): 108 mg/dL — ABNORMAL HIGH (ref 65–100)

## 2018-01-03 LAB — LACOSAMIDE: Lacosamide: 5.4 ug/mL (ref 5.0–10.0)

## 2018-01-03 LAB — CBC WITH AUTO DIFFERENTIAL
Basophils %: 1 % (ref 0–1)
Basophils Absolute: 0 10*3/uL (ref 0.0–0.1)
Eosinophils %: 3 % (ref 0–7)
Eosinophils Absolute: 0.2 10*3/uL (ref 0.0–0.4)
Granulocyte Absolute Count: 0 10*3/uL (ref 0.00–0.04)
Hematocrit: 35.2 % (ref 35.0–47.0)
Hemoglobin: 11.4 g/dL — ABNORMAL LOW (ref 11.5–16.0)
Immature Granulocytes: 0 % (ref 0.0–0.5)
Lymphocytes %: 35 % (ref 12–49)
Lymphocytes Absolute: 2.2 10*3/uL (ref 0.8–3.5)
MCH: 30.5 PG (ref 26.0–34.0)
MCHC: 32.4 g/dL (ref 30.0–36.5)
MCV: 94.1 FL (ref 80.0–99.0)
MPV: 10.6 FL (ref 8.9–12.9)
Monocytes %: 15 % — ABNORMAL HIGH (ref 5–13)
Monocytes Absolute: 0.9 10*3/uL (ref 0.0–1.0)
NRBC Absolute: 0 10*3/uL (ref 0.00–0.01)
Neutrophils %: 47 % (ref 32–75)
Neutrophils Absolute: 2.9 10*3/uL (ref 1.8–8.0)
Nucleated RBCs: 0 PER 100 WBC
Platelets: 325 10*3/uL (ref 150–400)
RBC: 3.74 M/uL — ABNORMAL LOW (ref 3.80–5.20)
RDW: 14.6 % — ABNORMAL HIGH (ref 11.5–14.5)
WBC: 6.3 10*3/uL (ref 3.6–11.0)

## 2018-01-03 LAB — POCT GLUCOSE
POC Glucose: 77 mg/dL (ref 65–100)
POC Glucose: 135 mg/dL — ABNORMAL HIGH (ref 65–100)
POC Glucose: 75 mg/dL (ref 65–100)
POC Glucose: 79 mg/dL (ref 65–100)
POC Glucose: 88 mg/dL (ref 65–100)
POC Glucose: 102 mg/dL — ABNORMAL HIGH (ref 65–100)
POC Glucose: 79 mg/dL (ref 65–100)
POC Glucose: 108 mg/dL — ABNORMAL HIGH (ref 65–100)

## 2018-01-03 LAB — BASIC METABOLIC PANEL
Anion Gap: 8 mmol/L (ref 5–15)
BUN: 15 MG/DL (ref 6–20)
Bun/Cre Ratio: 15 (ref 12–20)
CO2: 27 mmol/L (ref 21–32)
Calcium: 8.8 MG/DL (ref 8.5–10.1)
Chloride: 109 mmol/L — ABNORMAL HIGH (ref 97–108)
Creatinine: 1.01 MG/DL (ref 0.55–1.02)
EGFR IF NonAfrican American: 54 mL/min/{1.73_m2} — ABNORMAL LOW (ref >60)
GFR African American: >60 mL/min/{1.73_m2} (ref >60)
Glucose: 73 mg/dL (ref 65–100)
Potassium: 3.7 mmol/L (ref 3.5–5.1)
Sodium: 144 mmol/L (ref 136–145)

## 2018-01-03 MED ORDER — GADOTERATE MEGLUMINE 0.5 MMOL/ML IV SOLUTION
0.5 mmol/mL (376.9 mg/mL) | Freq: Once | INTRAVENOUS | Status: AC
Start: 2018-01-03 — End: 2018-01-02
  Administered 2018-01-03: 01:00:00 via INTRAVENOUS

## 2018-01-03 MED ORDER — HYDRALAZINE 25 MG TAB
25 mg | ORAL_TABLET | Freq: Three times a day (TID) | ORAL | 0 refills | Status: DC | PRN
Start: 2018-01-03 — End: 2018-07-15

## 2018-01-03 MED FILL — DEPAKOTE 250 MG TABLET,DELAYED RELEASE: 250 mg | ORAL | Qty: 2

## 2018-01-03 MED FILL — BD POSIFLUSH NORMAL SALINE 0.9 % INJECTION SYRINGE: INTRAMUSCULAR | Qty: 10

## 2018-01-03 MED FILL — VIMPAT 200 MG/20 ML INTRAVENOUS SOLUTION: 200 mg/20 mL | INTRAVENOUS | Qty: 10

## 2018-01-03 MED FILL — VIMPAT 200 MG/20 ML INTRAVENOUS SOLUTION: 200 mg/20 mL | INTRAVENOUS | Qty: 20

## 2018-01-03 MED FILL — LISINOPRIL 5 MG TAB: 5 mg | ORAL | Qty: 2

## 2018-01-03 MED FILL — TRICOR 48 MG TABLET: 48 mg | ORAL | Qty: 1

## 2018-01-03 MED FILL — LACTULOSE 20 GRAM/30 ML ORAL SOLUTION: 20 gram/30 mL | ORAL | Qty: 30

## 2018-01-03 MED FILL — METHYLPHENIDATE 5 MG TAB: 5 mg | ORAL | Qty: 1

## 2018-01-03 MED FILL — FLUTICASONE 50 MCG/ACTUATION NASAL SPRAY, SUSP: 50 mcg/actuation | NASAL | Qty: 16

## 2018-01-03 MED FILL — CITALOPRAM 20 MG TAB: 20 mg | ORAL | Qty: 1

## 2018-01-03 MED FILL — CHILDREN'S ASPIRIN 81 MG CHEWABLE TABLET: 81 mg | ORAL | Qty: 1

## 2018-01-03 MED FILL — ENOXAPARIN 40 MG/0.4 ML SUB-Q SYRINGE: 40 mg/0.4 mL | SUBCUTANEOUS | Qty: 0.4

## 2018-01-03 NOTE — Discharge Summary (Signed)
Hospitalist Discharge Summary     Patient ID:  TOMMY MINICHIELLO  [id number]  72 y.o.  15-Mar-1946  12/31/2017    PCP on record: Gwynn Burly, NP    Admit date: 12/31/2017  Discharge date and time: 01/03/2018    DISCHARGE DIAGNOSIS:    Acute encephalopathy, Post ictal  History of seizures  Essential hypertension  Mild cognitive impairment  Diabetes mellitus??  Depression    ??    CONSULTATIONS:  IP CONSULT TO NEUROLOGY    Excerpted HPI from H&P of Derrick Ravel, MD:  Aribella Vavra is a 72 y.o.  African American female who presents with altered mental status brought in by EMT to the hospital . She was seen last by family members on Saturday and today when her sister try to call her and there was  no response and later  found her on the floor altered.   I made a phone call to her sister who is the surrogate decision maker  said that she has been doing okay after the rehab but this happened now. She does not know much on what happened she said she follows both PCP and neurology properly and and takes her medications properly   patient is still confused couldn't give much history.  ??  We were asked         ______________________________________________________________________  DISCHARGE SUMMARY/HOSPITAL COURSE:  for full details see H&P, daily progress notes, labs, consult notes.     Acute encephalopathy, possibly post-ictal- Improving/ but still confused  --EEG negative for seizures as per Neuro  -EEG was done as per report  -MRI negative for acute etiology  --resolved  -ok to discharge from Neuro stand point  ??  History of seizures  --Continuing home medications at this time  No more seizures  PRN meds if needed  ??  Essential hypertension  --Continue lisinopril per home routine  --Hydralazine PRN  ??  Mild cognitive impairment  ??  Diabetes mellitus  --Continue glucose monitoring, thus far controlled but will add SSI  ??  Depression  Continue Home celexa  ??         _______________________________________________________________________  Patient seen and examined by me on discharge day.  Pertinent Findings:  Gen:    Not in distress  Chest: Clear lungs  CVS:   Regular rhythm.  No edema  Abd:  Soft, not distended, not tender  Neuro:  Alert, orintedx3, likely has worsening cognitive impairment  _______________________________________________________________________  DISCHARGE MEDICATIONS:   Current Discharge Medication List      START taking these medications    Details   hydrALAZINE (APRESOLINE) 25 mg tablet Take 1 Tab by mouth three (3) times daily as needed (systolic blood pressure greater than 170 or diastolic blood pressure greater than 110).  Qty: 30 Tab, Refills: 0         CONTINUE these medications which have NOT CHANGED    Details   donepezil (ARICEPT) 10 mg tablet TAKE 1 TABLET BY MOUTH EVERY NIGHT  Qty: 30 Tab, Refills: 0      gabapentin (NEURONTIN) 100 mg capsule TAKE 1 CAPSULE BY MOUTH THREE TIMES DAILY  Qty: 90 Cap, Refills: 0    Associated Diagnoses: Seizure (HCC); Memory difficulty; Poor short term memory; Neuropathy      divalproex DR (DEPAKOTE) 500 mg tablet TAKE 1 TABLET BY MOUTH TWICE DAILY  Qty: 60 Tab, Refills: 0      lacosamide (VIMPAT) 100 mg tab tablet Take 1 Tab by mouth  two (2) times a day. Max Daily Amount: 200 mg.  Qty: 60 Tab, Refills: 1    Associated Diagnoses: Seizure (HCC)      fenofibrate (LOFIBRA) 54 mg tablet Take 54 mg by mouth nightly.      lisinopril (PRINIVIL, ZESTRIL) 10 mg tablet Take 1 Tab by mouth daily.  Qty: 30 Tab, Refills: 6    Associated Diagnoses: Essential hypertension      citalopram (CELEXA) 20 mg tablet Take 1 Tab by mouth daily.  Qty: 30 Tab, Refills: 3    Associated Diagnoses: Depression, unspecified depression type      cholecalciferol (VITAMIN D3) 1,000 unit cap take 1 capsule by mouth once daily  Qty: 30 Cap, Refills: 3    Associated Diagnoses: Vitamin D deficiency       fluticasone (FLONASE) 50 mcg/actuation nasal spray 1 Spray by Both Nostrils route daily.  Qty: 1 Bottle, Refills: 1    Associated Diagnoses: Allergic rhinitis, unspecified seasonality, unspecified trigger      lactulose (ENULOSE) 10 gram/15 mL solution Take 1 tsp by mouth three (3) times daily.      aspirin 81 mg chewable tablet Take 81 mg by mouth daily.         STOP taking these medications       SUMAtriptan (IMITREX) 100 mg tablet Comments:   Reason for Stopping:         methylphenidate HCl (RITALIN) 5 mg tablet Comments:   Reason for Stopping:                 Patient Follow Up Instructions:   Activity: Activity as tolerated  Diet: Diabetic Diet    Follow-up Information     Follow up With Specialties Details Why Contact Info    Gwynn Burly, NP Nurse Practitioner Go on 01/08/2018 at 8:45 [address]  [place] [address]  [phone]          ________________________________________________________________    Risk of deterioration: Low    Condition at Discharge:  Stable  __________________________________________________________________    Disposition  Home with family, no needs    ____________________________________________________________________    Code Status: Full Code  ___________________________________________________________________      Total time in minutes spent coordinating this discharge (includes going over instructions, follow-up, prescriptions, and preparing report for sign off to her PCP) :  50 minutes    Signed:  Leota Jacobsen, MD

## 2018-01-03 NOTE — Progress Notes (Signed)
Discharge instructions were given to patient and daughter. Medications and plan of care of patient were discussed. Patient and family verbalized understanding. Patient was transported down in wheelchair.

## 2018-01-03 NOTE — Progress Notes (Signed)
Problem: Patient Education: Go to Patient Education Activity  Goal: Patient/Family Education  Outcome: Progressing Towards Goal     Problem: Falls - Risk of  Goal: *Absence of Falls  Description  Document [name] Fall Risk and appropriate interventions in the flowsheet.  Outcome: Progressing Towards Goal     Problem: Patient Education: Go to Patient Education Activity  Goal: Patient/Family Education  Outcome: Progressing Towards Goal     Problem: Pressure Injury - Risk of  Goal: *Prevention of pressure injury  Description  Document Braden Scale and appropriate interventions in the flowsheet.  Outcome: Progressing Towards Goal     Problem: Patient Education: Go to Patient Education Activity  Goal: Patient/Family Education  Outcome: Progressing Towards Goal

## 2018-01-03 NOTE — Progress Notes (Signed)
Problem: Patient Education: Go to Patient Education Activity  Goal: Patient/Family Education  01/03/2018 1830 by Wynona Neat  Outcome: Resolved/Met  01/03/2018 1532 by Wynona Neat  Outcome: Progressing Towards Goal     Problem: Falls - Risk of  Goal: *Absence of Falls  Description  Document Patrcia Dolly Fall Risk and appropriate interventions in the flowsheet.  01/03/2018 1830 by Wynona Neat  Outcome: Resolved/Met  01/03/2018 1532 by Wynona Neat  Outcome: Progressing Towards Goal     Problem: Patient Education: Go to Patient Education Activity  Goal: Patient/Family Education  01/03/2018 1830 by Wynona Neat  Outcome: Resolved/Met  01/03/2018 1532 by Wynona Neat  Outcome: Progressing Towards Goal     Problem: Pressure Injury - Risk of  Goal: *Prevention of pressure injury  Description  Document Braden Scale and appropriate interventions in the flowsheet.  01/03/2018 1830 by Wynona Neat  Outcome: Resolved/Met  01/03/2018 1532 by Wynona Neat  Outcome: Progressing Towards Goal     Problem: Patient Education: Go to Patient Education Activity  Goal: Patient/Family Education  01/03/2018 1830 by Wynona Neat  Outcome: Resolved/Met  01/03/2018 1532 by Wynona Neat  Outcome: Progressing Towards Goal
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
## 2018-01-03 NOTE — Progress Notes (Signed)
Bedside and Verbal shift change report given to Sarah (oncoming nurse) by Jay (offgoing nurse). Report included the following information SBAR, Kardex, MAR and Recent Results.

## 2018-01-03 NOTE — Progress Notes (Signed)
Spiritual Care Partner Volunteer visited patient in Neuroscience Telemetry on Jan 03, 2018.  Documented by  Chaplain Susan Varner, M.Div   Chaplain Paging Service [phone])

## 2018-01-03 NOTE — Progress Notes (Signed)
Medicare pt has received, reviewed, and signed 2nd IM letter informing them of their right to appeal the discharge.  Signed copy has been placed on pt bedside chart.              Margetta Lassiter-Hughes  [phone]

## 2018-01-03 NOTE — Progress Notes (Signed)
Plan: Home with additional family support     Reason for Admission:  Altered Mental Status unspecified                RRAT Score: 35                 Resources/supports as identified by patient/family: Pt  Has the support of her daughter as well as her sister who live close by                 Top Challenges facing patient (as identified by patient/family and CM):                       Finances/Medication cost?  Pt has medicaid. Obtaining medications is not a concern                   Transportation? Pt daughter or sister provide transportation to appts. Pt also has medicaid transport available to her if necessary                Support system or lack thereof?  Pt has the support of her daughter and sister                      Living arrangements?  Pt currently resides alone. Her sister and daughter alternate between caring for her.               Self-care/ADLs/Cognition?  Pt is alert but only oriented to person.           Current Advanced Directive/Advance Care Plan:  No plan on file. Pt is very confused.                           Plan for utilizing home health: No plan to utilize [facility] at this time                          Likelihood of readmission: high based on RRAT                  Transition of Care Plan:  CM attempted to speak with pt but was unsuccessful due to pt altered mental status. CM contacted pt sister who provided information for pt daughter. CM spoke with pt daughter Suzette Battiest, to verify pt demographic, insurance, and PCP information.     Pt currently resides alone in an apartment. Pt has no reported issues with mobility. Pt receives assistance with care from her daughter and sister who take turns checking on her throughout the day.     Plan is for pt to discharge home with additional supervision. Pt daughter will pick pt up at discharge and she will stay at her daughters home. Pt sister will take over her care in the morning and family members will  alternate during the day. CM spoke with pt daughter concerning the future of pt care. CM will complete [facility] and has provided pt daughter with list private duty care aides. CM will also send referral to [facility] via [employer].     Care Management has completed the discharge planning needs of the patient at this time.   Care Management Interventions  PCP Verified by CM: Yes  Mode of Transport at Discharge: Other (see comment)  Transition of Care Consult (CM Consult): Discharge Planning  Current Support Network: Own Home, Family Lives Nearby  Confirm Follow Up  Transport: Family  Plan discussed with Pt/Family/Caregiver: Yes  Discharge Location  Discharge Placement: Home      [place], Care Manager  [phone]

## 2018-01-03 NOTE — Progress Notes (Signed)
Spiritual Care Partner Volunteer visited patient in Neuroscience Telemetry on Jan 03, 2018.  Documented by  Gerrit Friends, M.Corporate investment banker Paging Service [phone])

## 2018-01-03 NOTE — Progress Notes (Signed)
Discharge instructions were given to patient and daughter. Medications and plan of care of patient were discussed. Patient and family verbalized understanding. Patient was transported down in wheelchair.

## 2018-01-03 NOTE — Progress Notes (Signed)
Problem: Patient Education: Go to Patient Education Activity  Goal: Patient/Family Education  01/03/2018 1830 by Wynona Neat  Outcome: Resolved/Met  01/03/2018 1532 by Wynona Neat  Outcome: Progressing Towards Goal     Problem: Falls - Risk of  Goal: *Absence of Falls  Description  Document Meghan [name] Fall Risk and appropriate interventions in the flowsheet.  01/03/2018 1830 by Wynona Neat  Outcome: Resolved/Met  01/03/2018 1532 by Wynona Neat  Outcome: Progressing Towards Goal     Problem: Patient Education: Go to Patient Education Activity  Goal: Patient/Family Education  01/03/2018 1830 by Wynona Neat  Outcome: Resolved/Met  01/03/2018 1532 by Wynona Neat  Outcome: Progressing Towards Goal     Problem: Pressure Injury - Risk of  Goal: *Prevention of pressure injury  Description  Document Braden Scale and appropriate interventions in the flowsheet.  01/03/2018 1830 by Wynona Neat  Outcome: Resolved/Met  01/03/2018 1532 by Wynona Neat  Outcome: Progressing Towards Goal     Problem: Patient Education: Go to Patient Education Activity  Goal: Patient/Family Education  01/03/2018 1830 by Wynona Neat  Outcome: Resolved/Met  01/03/2018 1532 by Wynona Neat  Outcome: Progressing Towards Goal

## 2018-01-03 NOTE — Progress Notes (Signed)
Plan: Home with additional family support     Reason for Admission:  Altered Mental Status unspecified                RRAT Score: 35                 Resources/supports as identified by patient/family: Pt  Has the support of her daughter as well as her sister who live close by                 Top Challenges facing patient (as identified by patient/family and CM):                       Finances/Medication cost?  Pt has medicaid. Obtaining medications is not a concern                   Transportation? Pt daughter or sister provide transportation to appts. Pt also has medicaid transport available to her if necessary                Support system or lack thereof?  Pt has the support of her daughter and sister                      Living arrangements?  Pt currently resides alone. Her sister and daughter alternate between caring for her.               Self-care/ADLs/Cognition?  Pt is alert but only oriented to person.           Current Advanced Directive/Advance Care Plan:  No plan on file. Pt is very confused.                           Plan for utilizing home health: No plan to utilize [facility] at this time                          Likelihood of readmission: high based on RRAT                  Transition of Care Plan:  CM attempted to speak with pt but was unsuccessful due to pt altered mental status. CM contacted pt sister who provided information for pt daughter. CM spoke with pt daughter Meghan [name], to verify pt demographic, insurance, and PCP information.     Pt currently resides alone in an apartment. Pt has no reported issues with mobility. Pt receives assistance with care from her daughter and sister who take turns checking on her throughout the day.     Plan is for pt to discharge home with additional supervision. Pt daughter will pick pt up at discharge and she will stay at her daughters home. Pt sister will take over her care in the morning and family members will alternate during the day. CM spoke with pt daughter  concerning the future of pt care. CM will complete [facility] and has provided pt daughter with list private duty care aides. CM will also send referral to [facility] via [employer].     Care Management has completed the discharge planning needs of the patient at this time.   Care Management Interventions  PCP Verified by CM: Yes  Mode of Transport at Discharge: Other (see comment)  Transition of Care Consult (CM Consult): Discharge Planning  Current Support Network: Own Home, Family Lives Nearby  Confirm Follow Up  Transport: Family  Plan discussed with Pt/Family/Caregiver: Yes  Discharge Location  Discharge Placement: Home      [place], Care Manager  [phone]

## 2018-01-03 NOTE — Discharge Summary (Signed)
Discharge Summary by Meghan Jacobsen, MD at 01/03/18 1415                Author: Leota Jacobsen, MD  Service: Internal Medicine  Author Type: Physician       Filed: 01/03/18 1418  Date of Service: 01/03/18 1415  Status: Signed          Editor: Meghan Jacobsen, MD (Physician)                                       Hospitalist Discharge Summary        Patient ID:   Meghan [name]   [id number]   71 y.o.   03-13-1946   12/31/2017      PCP on record: Meghan Burly, NP      Admit date: 12/31/2017   Discharge date and time: 01/03/2018      DISCHARGE DIAGNOSIS:      Acute encephalopathy, Post ictal   History of seizures   Essential hypertension   Mild cognitive impairment   Diabetes mellitus??   Depression      ??      CONSULTATIONS:   IP CONSULT TO NEUROLOGY      Excerpted HPI from H&P of Meghan Ravel, MD:   Meghan [name] is a 72 y.o.  African American female who presents with altered mental status brought in by EMT to the hospital . She was seen last by family members on  Saturday and today when her sister try to call her and there was  no response and later  found her on the floor altered.    I made a phone call to her sister who is the surrogate decision maker   said that she has been doing okay after the rehab but this happened now. She does not know much on what happened she said she follows both PCP and neurology properly and and takes her medications properly    patient is still confused couldn't give much history.   ??   We were asked             ______________________________________________________________________   DISCHARGE SUMMARY/HOSPITAL COURSE:   for full details see H&P, daily progress notes, labs, consult notes.       Acute encephalopathy, possibly post-ictal- Improving/ but still confused   --EEG negative for seizures as per Neuro   -EEG was done as per report   -MRI negative for acute etiology   --resolved   -ok to discharge from Neuro stand point   ??   History of seizures    --Continuing home medications at this time   No more seizures   PRN meds if needed   ??   Essential hypertension   --Continue lisinopril per home routine   --Hydralazine PRN   ??   Mild cognitive impairment   ??   Diabetes mellitus   --Continue glucose monitoring, thus far controlled but will add SSI   ??   Depression   Continue Home celexa   ??            _______________________________________________________________________   Patient seen and examined by me on discharge day.   Pertinent Findings:   Gen:    Not in distress   Chest: Clear lungs   CVS:   Regular rhythm.  No edema   Abd:  Soft, not distended, not tender  Neuro:  Alert, orintedx3, likely has worsening cognitive impairment   _______________________________________________________________________   DISCHARGE MEDICATIONS:      Current Discharge Medication List              START taking these medications          Details        hydrALAZINE (APRESOLINE) 25 mg tablet  Take 1 Tab by mouth three (3) times daily as needed (systolic blood pressure greater than 170 or diastolic blood pressure greater than 110).   Qty: 30 Tab, Refills:  0                     CONTINUE these medications which have NOT CHANGED          Details        donepezil (ARICEPT) 10 mg tablet  TAKE 1 TABLET BY MOUTH EVERY NIGHT   Qty: 30 Tab, Refills:  0               gabapentin (NEURONTIN) 100 mg capsule  TAKE 1 CAPSULE BY MOUTH THREE TIMES DAILY   Qty: 90 Cap, Refills:  0          Associated Diagnoses: Seizure (HCC); Memory difficulty; Poor short term memory; Neuropathy               divalproex DR (DEPAKOTE) 500 mg tablet  TAKE 1 TABLET BY MOUTH TWICE DAILY   Qty: 60 Tab, Refills:  0               lacosamide (VIMPAT) 100 mg tab tablet  Take 1 Tab by mouth two (2) times a day. Max Daily Amount: 200 mg.   Qty: 60 Tab, Refills:  1          Associated Diagnoses: Seizure (HCC)               fenofibrate (LOFIBRA) 54 mg tablet  Take 54 mg by mouth nightly.               lisinopril (PRINIVIL, ZESTRIL)  10 mg tablet  Take 1 Tab by mouth daily.   Qty: 30 Tab, Refills:  6          Associated Diagnoses: Essential hypertension               citalopram (CELEXA) 20 mg tablet  Take 1 Tab by mouth daily.   Qty: 30 Tab, Refills:  3          Associated Diagnoses: Depression, unspecified depression type               cholecalciferol (VITAMIN D3) 1,000 unit cap  take 1 capsule by mouth once daily   Qty: 30 Cap, Refills:  3          Associated Diagnoses: Vitamin D deficiency               fluticasone (FLONASE) 50 mcg/actuation nasal spray  1 Spray by Both Nostrils route daily.   Qty: 1 Bottle, Refills:  1          Associated Diagnoses: Allergic rhinitis, unspecified seasonality, unspecified trigger               lactulose (ENULOSE) 10 gram/15 mL solution  Take 1 tsp by mouth three (3) times daily.               aspirin 81 mg chewable tablet  Take 81 mg by mouth daily.  STOP taking these medications                  SUMAtriptan (IMITREX) 100 mg tablet  Comments:    Reason for Stopping:                      methylphenidate HCl (RITALIN) 5 mg tablet  Comments:    Reason for Stopping:                                Patient Follow Up Instructions:    Activity: Activity as tolerated   Diet: Diabetic Diet        Follow-up Information               Follow up With  Specialties  Details  Why  Contact Info              Meghan Burly, NP  Nurse Practitioner  Go on 01/08/2018  at 8:45  [address]   [place] [address]   [phone]                ________________________________________________________________      Risk of deterioration: Low      Condition at Discharge:  Stable   __________________________________________________________________      Disposition   Home with family, no needs      ____________________________________________________________________      Code Status: Full Code   ___________________________________________________________________         Total time in minutes spent  coordinating this discharge (includes going over instructions, follow-up, prescriptions, and preparing report for sign off to her PCP) :  50 minutes      Signed:   Leota Jacobsen, MD

## 2018-01-03 NOTE — Progress Notes (Signed)
Medicare pt has received, reviewed, and signed 2nd IM letter informing them of their right to appeal the discharge.  Signed copy has been placed on pt bedside chart.              Meghan [name]  [phone]
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
## 2018-01-04 NOTE — Progress Notes (Signed)
Hospital Discharge Follow-Up      Date/Time:  01/09/2018 3:57 PM    Patient was admitted to [facility] on 5/20 and discharged on 5/23 for AMS. The physician discharge summary was available at the time of outreach.  Patient was contacted within 1 business days of discharge.      Top Challenges reviewed with the provider   Pt will need an order for a home BP cuff d/t indications for the Hydralazine that hasn't been filled yet.   TOC 5/28       Method of communication with provider :chart routing    Inpatient RRAT score: 35  Was this a readmission? no   Patient stated reason for the readmission: n/a    Nurse Navigator (NN) contacted the family by telephone to perform post hospital discharge assessment. Verified name and DOB with family as identifiers. Provided introduction to self, and explanation of the Nurse Navigator role.     Reviewed discharge instructions and red flags with family who verbalized understanding. Family given an opportunity to ask questions and does not have any further questions or concerns at this time. The family agrees to contact the PCP office for questions related to their healthcare. NN provided contact information for future reference.    Disease Specific:   [name]    Summary of patient's top problems:  1. AMS- acute encephalopathy, possible post-ictal, EEG neg for seizure, neg MRI, per chart resolved but still confused/alert to person only. Per PCP and Neuro pt not safe to live alone but family not interested in any form of long term care/assisted living facility.     2. Seizures- thought to be cause of AMS but EEG neg on this admission, pt unable to give details surrounding events prior to admission, followed by neuro (last ov 3/22, no future apt noted).    3. ACP- not on file. Sister Veva Holes in the past expressed hopes of pt's son obtaining POA- not done as of this call. Pt has dementia.     Home Health orders at discharge: none  Home Health company: n/a   Date of initial visit: n/a    Durable Medical Equipment ordered/company: n/a  Durable Medical Equipment received: n/a    Barriers to care? Dementia, financial, medication management, support system    Advance Care Planning:   Does patient have an Advance Directive:  not on file     Medication(s):   New Medications at Discharge:   Hydralazine 25 mg PRN systolic >170, diastolic > 110    Changed Medications at Discharge: n/a    Discontinued Medications at Discharge:   Imatrex   Ritalin     Medication reconciliation was NOT performed with family, who is not home with medications. Agrees to [facility] to [facility].  There were no barriers to obtaining medications identified at this time.    Referral to Pharm D needed: no     Current Outpatient Medications   Medication Sig   ??? divalproex DR (DEPAKOTE) 500 mg tablet TAKE 1 TABLET BY MOUTH TWICE DAILY   ??? hydrALAZINE (APRESOLINE) 25 mg tablet Take 1 Tab by mouth three (3) times daily as needed (systolic blood pressure greater than 170 or diastolic blood pressure greater than 110).   ??? donepezil (ARICEPT) 10 mg tablet TAKE 1 TABLET BY MOUTH EVERY NIGHT   ??? gabapentin (NEURONTIN) 100 mg capsule TAKE 1 CAPSULE BY MOUTH THREE TIMES DAILY   ??? lacosamide (VIMPAT) 100 mg tab tablet Take 1 Tab by mouth  two (2) times a day. Max Daily Amount: 200 mg.   ??? fenofibrate (LOFIBRA) 54 mg tablet Take 54 mg by mouth nightly.   ??? lisinopril (PRINIVIL, ZESTRIL) 10 mg tablet Take 1 Tab by mouth daily.   ??? citalopram (CELEXA) 20 mg tablet Take 1 Tab by mouth daily.   ??? cholecalciferol (VITAMIN D3) 1,000 unit cap take 1 capsule by mouth once daily   ??? fluticasone (FLONASE) 50 mcg/actuation nasal spray 1 Spray by Both Nostrils route daily.   ??? lactulose (ENULOSE) 10 gram/15 mL solution Take 1 tsp by mouth three (3) times daily.   ??? aspirin 81 mg chewable tablet Take 81 mg by mouth daily.     No current facility-administered medications for this visit.        There are no discontinued medications.     [facility] follow up appointment(s):   Future Appointments   Date Time Provider Department Center   01/23/2018  7:45 AM Seabrook, Annie Sable, NP [facility] SCHED      Non-[facility] follow up appointment(s): n/a  [facility]:  information provided as a resource       Goals     ??? Identification of barriers to adherence to a plan of care such as inability to afford medications, lack of insurance, lack of transportation, etc.      While admitted pt was prescribed PRN Hydralazine for those times Systolic > 170 or Diastolic >110. Per [name], pt does not have a home bp cuff. This Clinical research associate explains the importance of getting a cuff and how it relates to this new medication. This Clinical research associate advises will ask PCP to prescribe a BP cuff at 5/28 toc. [name] understands it's importance and will have new script filled by 6/7. Next step will involve setting a reg schedule for monitoring pt's BP, creating a log and when to report to providers. Reassess by 6/7 (ms)      ??? Knowledge and adherence of prescribed medication (ie. action, side effects, missed dose, etc.).      5/24  Ms Willig has a history of dementia, seizures, depression and diabetes. She lives alone in a new apartment that all amenities on one floor. This helps decrease pt's risk of falls. Family members check on her regularly. Her medications are placed in a pill box and administered by her sister Veva Holes. Due pt's current mental state she tends to move things/hide things including her medications from those supporting her. Veva Holes has agreed to bring pt's medication bottles to 5/28 TOC for review and pick up the new Hydralazine script. Reassess by 5/30 (ms).

## 2018-01-04 NOTE — Progress Notes (Signed)
 Hospital Discharge Follow-Up      Date/Time:  01/09/2018 3:57 PM    Patient was admitted to [facility] on 5/20 and discharged on 5/23 for AMS. The physician discharge summary was available at the time of outreach.  Patient was contacted within 1 business days of discharge.      Top Challenges reviewed with the provider   Pt will need an order for a home BP cuff d/t indications for the Hydralazine  that hasn't been filled yet.   TOC 5/28       Method of communication with provider :chart routing    Inpatient RRAT score: 35  Was this a readmission? no   Patient stated reason for the readmission: n/a    Nurse Navigator (NN) contacted the family by telephone to perform post hospital discharge assessment. Verified name and DOB with family as identifiers. Provided introduction to self, and explanation of the Nurse Navigator role.     Reviewed discharge instructions and red flags with family who verbalized understanding. Family given an opportunity to ask questions and does not have any further questions or concerns at this time. The family agrees to contact the PCP office for questions related to their healthcare. NN provided contact information for future reference.    Disease Specific:   [name]    Summary of patient's top problems:  1. AMS- acute encephalopathy, possible post-ictal, EEG neg for seizure, neg MRI, per chart resolved but still confused/alert to person only. Per PCP and Neuro pt not safe to live alone but family not interested in any form of long term care/assisted living facility.     2. Seizures- thought to be cause of AMS but EEG neg on this admission, pt unable to give details surrounding events prior to admission, followed by neuro (last ov 3/22, no future apt noted).    3. ACP- not on file. Sister Kenney in the past expressed hopes of pt's son obtaining POA- not done as of this call. Pt has dementia.     Home Health orders at discharge: none  Home Health company: n/a  Date of  initial visit: n/a    Durable Medical Equipment ordered/company: n/a  Durable Medical Equipment received: n/a    Barriers to care? Dementia, financial, medication management, support system    Advance Care Planning:   Does patient have an Advance Directive:  not on file     Medication(s):   New Medications at Discharge:   Hydralazine  25 mg PRN systolic >170, diastolic > 110    Changed Medications at Discharge: n/a    Discontinued Medications at Discharge:   Imatrex 100mg   Ritalin 5mg     Medication reconciliation was NOT performed with family, who is not home with medications. Agrees to [facility] to [facility].  There were no barriers to obtaining medications identified at this time.    Referral to Pharm D needed: no     Current Outpatient Medications   Medication Sig   . divalproex  DR (DEPAKOTE ) 500 mg tablet TAKE 1 TABLET BY MOUTH TWICE DAILY   . hydrALAZINE  (APRESOLINE ) 25 mg tablet Take 1 Tab by mouth three (3) times daily as needed (systolic blood pressure greater than 170 or diastolic blood pressure greater than 110).   . donepezil  (ARICEPT ) 10 mg tablet TAKE 1 TABLET BY MOUTH EVERY NIGHT   . gabapentin (NEURONTIN) 100 mg capsule TAKE 1 CAPSULE BY MOUTH THREE TIMES DAILY   . lacosamide  (VIMPAT ) 100 mg tab tablet Take 1 Tab by mouth  two (2) times a day. Max Daily Amount: 200 mg.   . fenofibrate (LOFIBRA) 54 mg tablet Take 54 mg by mouth nightly.   . lisinopril  (PRINIVIL , ZESTRIL ) 10 mg tablet Take 1 Tab by mouth daily.   . citalopram  (CELEXA ) 20 mg tablet Take 1 Tab by mouth daily.   . cholecalciferol (VITAMIN D3) 1,000 unit cap take 1 capsule by mouth once daily   . fluticasone (FLONASE) 50 mcg/actuation nasal spray 1 Spray by Both Nostrils route daily.   SABRA lactulose (ENULOSE) 10 gram/15 mL solution Take 1 tsp by mouth three (3) times daily.   . aspirin  81 mg chewable tablet Take 81 mg by mouth daily.     No current facility-administered medications for this visit.        There are no discontinued medications.    [facility]  follow up appointment(s):   Future Appointments   Date Time Provider Department Center   01/23/2018  7:45 AM Seabrook, Berna A, NP [facility] SCHED      Non-[facility] follow up appointment(s): n/a  [facility]:  information provided as a resource       Goals     . Identification of barriers to adherence to a plan of care such as inability to afford medications, lack of insurance, lack of transportation, etc.      While admitted pt was prescribed PRN Hydralazine  for those times Systolic > 170 or Diastolic >110. Per [name], pt does not have a home bp cuff. This Clinical research associate explains the importance of getting a cuff and how it relates to this new medication. This Clinical research associate advises will ask PCP to prescribe a BP cuff at 5/28 toc. [name] understands it's importance and will have new script filled by 6/7. Next step will involve setting a reg schedule for monitoring pt's BP, creating a log and when to report to providers. Reassess by 6/7 (ms)      . Knowledge and adherence of prescribed medication (ie. action, side effects, missed dose, etc.).      5/24  Ms Bogen has a history of dementia, seizures, depression and diabetes. She lives alone in a new apartment that all amenities on one floor. This helps decrease pt's risk of falls. Family members check on her regularly. Her medications are placed in a pill box and administered by her sister Kenney. Due pt's current mental state she tends to move things/hide things including her medications from those supporting her. Kenney has agreed to bring pt's medication bottles to 5/28 TOC for review and pick up the new Hydralazine  script. Reassess by 5/30 (ms).

## 2018-01-07 MED ORDER — DIVALPROEX 500 MG TAB, DELAYED RELEASE
500 mg | ORAL_TABLET | ORAL | 0 refills | Status: DC
Start: 2018-01-07 — End: 2018-02-01

## 2018-01-08 ENCOUNTER — Ambulatory Visit
Admit: 2018-01-08 | Discharge: 2018-01-08 | Payer: PRIVATE HEALTH INSURANCE | Attending: Nurse Practitioner | Primary: Internal Medicine

## 2018-01-08 ENCOUNTER — Encounter: Attending: Internal Medicine | Primary: Internal Medicine

## 2018-01-08 ENCOUNTER — Ambulatory Visit: Attending: Nurse Practitioner | Primary: Internal Medicine

## 2018-01-08 DIAGNOSIS — Z0000 Encounter for general adult medical examination without abnormal findings: Secondary | ICD-10-CM

## 2018-01-08 LAB — AMB POC URINE, MICROALBUMIN, SEMIQUANTITATIVE
Microalb/Creat Ratio POC: <30 MG/G (ref <30)
Microalbumin urine (POC): 30 MG/L
Microalbumin urine, POC: 30 MG/L
Microalbumin/creat ratio (POC): <30 MG/G (ref <30)

## 2018-01-08 LAB — AMB POC URINALYSIS DIP STICK AUTO W/O MICRO
Blood (UA POC): NEGATIVE
Blood (UA POC): NEGATIVE
Glucose (UA POC): NEGATIVE
Glucose, Urine, POC: NEGATIVE
Ketones (UA POC): NEGATIVE
Ketones, Urine, POC: NEGATIVE
Leukocyte Esterase, Urine, POC: NEGATIVE
Leukocyte esterase (UA POC): NEGATIVE
Nitrite, Urine, POC: NEGATIVE
Nitrites (UA POC): NEGATIVE
Specific Gravity, Urine, POC: 1.03 NA (ref 1.001–1.035)
Specific gravity (UA POC): 1.03 (ref 1.001–1.035)
Urobilinogen (UA POC): 0.2 (ref 0.2–1)
Urobilinogen, POC: 0.2 (ref 0.2–1)
pH (UA POC): 6 (ref 4.6–8.0)
pH, Urine, POC: 6 NA (ref 4.6–8.0)

## 2018-01-08 LAB — AMB POC LIPID PROFILE
Cholesterol (POC): 169
Cholesterol, POC: 169 NA
HDL Cholesterol (POC): 45
HDL Cholesterol, POC: 45 NA
LDL Cholesterol (POC): 108 MG/DL
LDL Cholesterol, POC: 108 MG/DL
Non-HDL Goal (POC): 124
Non-HDL Goal, POC: 124 NA
TChol/HDL Ratio (POC): 3.8
TChol/HDL Ratio (POC): 3.8 NA
Triglycerides (POC): 80
Triglycerides, POC: 80 NA

## 2018-01-08 LAB — AMB POC HEMOGLOBIN A1C
Hemoglobin A1C, POC: 5.4 %
Hemoglobin A1c (POC): 5.4 %

## 2018-01-08 NOTE — Progress Notes (Signed)
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 idea to know your test results and keep a list of the medicines you take.  How can you care for yourself at home?  Medical treatment  ?? If you stop taking your medicine, your blood pressure will go back up. You may take one or more types of medicine to lower your blood pressure. Be safe with medicines. Take your medicine exactly as prescribed. Call your doctor if you think you are having a problem with your medicine.  ?? Talk to your doctor before you start taking aspirin every day. Aspirin can help certain people lower their risk of a heart attack or stroke. But taking aspirin isn't right for everyone, because it can cause serious bleeding.  ?? See your doctor regularly. You may need to see the doctor more often at first or until your blood pressure comes down.  ?? If you are taking blood pressure medicine, talk to your doctor before you take decongestants or anti-inflammatory medicine, such as ibuprofen. Some of these medicines can raise blood pressure.  ?? Learn how to check your blood pressure at home.  Lifestyle changes   ?? Stay at a healthy weight. This is especially important if you put on weight around the waist. Losing even 10 pounds can help you lower your blood pressure.  ?? If your doctor recommends it, get more exercise. Walking is a good choice. Bit by bit, increase the amount you walk every day. Try for at least 30 minutes on most days of the week. You also may want to swim, bike, or do other activities.  ?? Avoid or limit alcohol. Talk to your doctor about whether you can drink any alcohol.  ?? Try to limit how much sodium you eat to less than 2,300 milligrams (mg) a day. Your doctor may ask you to try to eat less than 1,500 mg a day.  ?? Eat plenty of fruits (such as bananas and oranges), vegetables, legumes, whole grains, and low-fat dairy products.  ?? Lower the amount of saturated fat in your diet. Saturated fat is found in animal products such as milk, cheese, and meat. Limiting these foods may help you lose weight and also lower your risk for heart disease.  ?? Do not smoke. Smoking increases your risk for heart attack and stroke. If you need help quitting, talk to your doctor about stop-smoking programs and medicines. These can increase your chances of quitting for good.  When should you call for help?  Call 911 anytime you think you may need emergency care. This may mean having symptoms that suggest that your blood pressure is causing a serious heart or blood vessel problem. Your blood pressure may be over 180/120.  ??For example, call 911 if:  ?? ?? You have symptoms of a heart attack. These may include:  ? Chest pain or pressure, or a strange feeling in the chest.  ? Sweating.  ? Shortness of breath.  ? Nausea or vomiting.  ? Pain, pressure, or a strange feeling in the back, neck, jaw, or upper belly or in one or both shoulders or arms.  ? Lightheadedness or sudden weakness.  ? A fast or irregular heartbeat.   ?? ?? You have symptoms of a stroke. These may include:   ? Sudden numbness, tingling, weakness, or loss of movement in your face, arm, or leg, especially on only one side of your body.  ? Sudden vision changes.  ? Sudden trouble speaking.  ? Sudden confusion or trouble understanding  simple statements.  ? Sudden problems with walking or balance.  ? A sudden, severe headache that is different from past headaches.   ?? ?? You have severe back or belly pain.   ??Do not wait until your blood pressure comes down on its own. Get help right away.  ??Call your doctor now or seek immediate care if:  ?? ?? Your blood pressure is much higher than normal (such as 180/120 or higher), but you don't have symptoms.   ?? ?? You think high blood pressure is causing symptoms, such as:  ? Severe headache.  ? Blurry vision.   ??Watch closely for changes in your health, and be sure to contact your doctor if:  ?? ?? Your blood pressure measures higher than your doctor recommends at least 2 times. That means the top number is higher or the bottom number is higher, or both.   ?? ?? You think you may be having side effects from your blood pressure medicine.   Where can you learn more?  Go to [employer].  Enter [phone] in the search box to learn more about "High Blood Pressure: Care Instructions."  Current as of: March 04, 2017  Content Version: 11.9  ?? 2006-2018 [employer]. Care instructions adapted under license by [employer] (which disclaims liability or warranty for this information). If you have questions about a medical condition or this instruction, always ask your healthcare professional. [employer] disclaims any warranty or liability for your use of this information.         Seizure: Care Instructions  Your Care Instructions    Seizures are caused by abnormal patterns of electrical signals in the brain. They are different for each person.  Seizures can affect movement, speech, vision, or awareness. Some people  have only slight shaking of a hand and do not pass out. Other people may pass out and have violent shaking of the whole body. Some people appear to stare into space. They are awake, but they can't respond normally. Later, they may not remember what happened.  You may need tests to identify the type and cause of the seizures.  A seizure may occur only once, or you may have them more than one time. Taking medicines as directed and following up with your doctor may help keep you from having more seizures.  The doctor has checked you carefully, but problems can develop later. If you notice any problems or new symptoms, get medical treatment right away.  Follow-up care is a key part of your treatment and safety. Be sure to make and go to all appointments, and call your doctor if you are having problems. It's also a good idea to know your test results and keep a list of the medicines you take.  How can you care for yourself at home?  ?? Be safe with medicines. Take your medicines exactly as prescribed. Call your doctor if you think you are having a problem with your medicine.  ?? Do not do any activity that could be dangerous to you or others until your doctor says it is safe to do so. For example, do not drive a car, operate machinery, swim, or climb ladders.  ?? Be sure that anyone treating you for any health problem knows that you have had a seizure and what medicines you are taking for it.  ?? Identify and avoid things that may make you more likely to have a seizure. These may include lack of sleep, alcohol or drug  use, stress, or not eating.  ?? Make sure you go to your follow-up appointment.  When should you call for help?  Call 911 anytime you think you may need emergency care. For example, call if:  ?? ?? You have another seizure.   ?? ?? You have more than one seizure in 24 hours.   ?? ?? You have new symptoms, such as trouble walking, speaking, or thinking clearly.    ??Call your doctor now or seek immediate medical care if:  ?? ?? You are not acting normally.   ??Watch closely for changes in your health, and be sure to contact your doctor if you have any problems.  Where can you learn more?  Go to [employer].  Enter [phone] in the search box to learn more about "Seizure: Care Instructions."  Current as of: January 14, 2017  Content Version: 11.9  ?? 2006-2018 [employer]. Care instructions adapted under license by [employer] (which disclaims liability or warranty for this information). If you have questions about a medical condition or this instruction, always ask your healthcare professional. [employer] disclaims any warranty or liability for your use of this information.         Well Visit, Over 65: Care Instructions  Your Care Instructions    Physical exams can help you stay healthy. Your doctor has checked your overall health and may have suggested ways to take good care of yourself. He or she also may have recommended tests. At home, you can help prevent illness with healthy eating, regular exercise, and other steps.  Follow-up care is a key part of your treatment and safety. Be sure to make and go to all appointments, and call your doctor if you are having problems. It's also a good idea to know your test results and keep a list of the medicines you take.  How can you care for yourself at home?  ?? Reach and stay at a healthy weight. This will lower your risk for many problems, such as obesity, diabetes, heart disease, and high blood pressure.  ?? Get at least 30 minutes of exercise on most days of the week. Walking is a good choice. You also may want to do other activities, such as running, swimming, cycling, or playing tennis or team sports.  ?? Do not smoke. Smoking can make health problems worse. If you need help quitting, talk to your doctor about stop-smoking programs and medicines.  These can increase your chances of quitting for good.  ?? Protect your skin from too much sun. When you're outdoors from 10 a.m. to 4 p.m., stay in the shade or cover up with clothing and a hat with a wide brim. Wear sunglasses that block UV rays. Even when it's cloudy, put broad-spectrum sunscreen (SPF 30 or higher) on any exposed skin.  ?? See a dentist one or two times a year for checkups and to have your teeth cleaned.  ?? Wear a seat belt in the car.  ?? Limit alcohol to 2 drinks a day for men and 1 drink a day for women. Too much alcohol can cause health problems.  Follow your doctor's advice about when to have certain tests. These tests can spot problems early.  For men and women  ?? Cholesterol. Your doctor will tell you how often to have this done based on your overall health and other things that can increase your risk for heart attack and stroke.  ?? Blood pressure. Have your blood pressure checked  during a routine doctor visit. Your doctor will tell you how often to check your blood pressure based on your age, your blood pressure results, and other factors.  ?? Diabetes. Ask your doctor whether you should have tests for diabetes.  ?? Vision. Experts recommend that you have yearly exams for glaucoma and other age-related eye problems.  ?? Hearing. Tell your doctor if you notice any change in your hearing. You can have tests to find out how well you hear.  ?? Colon cancer tests. Keep having colon cancer tests as your doctor recommends. You can have one of several types of tests.  ?? Heart attack and stroke risk. At least every 4 to 6 years, you should have your risk for heart attack and stroke assessed. Your doctor uses factors such as your age, blood pressure, cholesterol, and whether you smoke or have diabetes to show what your risk for a heart attack or stroke is over the next 10 years.  ?? Osteoporosis. Talk to your doctor about whether you should have a bone  density test to find out whether you have thinning bones. Also ask your doctor about whether you should take calcium and vitamin D supplements.  For women  ?? Pap test and pelvic exam. You may no longer need a Pap test. Talk with your doctor about whether to stop or continue to have Pap tests.  ?? Breast exam and mammogram. Ask how often you should have a mammogram, which is an X-ray of your breasts. A mammogram can spot breast cancer before it can be felt and when it is easiest to treat.  ?? Thyroid disease. Talk to your doctor about whether to have your thyroid checked as part of a regular physical exam. Women have an increased chance of a thyroid problem.  For men  ?? Prostate exam. Talk to your doctor about whether you should have a blood test (called a PSA test) for prostate cancer. Experts disagree on whether men should have this test. Some experts recommend that you discuss the benefits and risks of the test with your doctor.  ?? Abdominal aortic aneurysm. Ask your doctor whether you should have a test to check for an aneurysm. You may need a test if you ever smoked or if your parent, brother, sister, or child has had an aneurysm.  When should you call for help?  Watch closely for changes in your health, and be sure to contact your doctor if you have any problems or symptoms that concern you.  Where can you learn more?  Go to [employer].  Enter [phone] in the search box to learn more about "Well Visit, Over 65: Care Instructions."  Current as of: November 08, 2016  Content Version: 11.9  ?? 2006-2018 [employer]. Care instructions adapted under license by [employer] (which disclaims liability or warranty for this information). If you have questions about a medical condition or this instruction, always ask your healthcare professional. [employer] disclaims any warranty or liability for your use of this information.          Follow-up and Dispositions    ?? Return in about 2 weeks (around 01/22/2018), or if symptoms worsen or fail to improve, for discuss results; seizure disorder; htn; DM; dementia.         I have reviewed with the patient details of the assessment and plan and all questions were answered. Relevent patient education was performed    I have reviewed with patient and her sister her  current medications. Sister states patient lost her pill bottles, but has prefilled medications in her pill box. She will continue to look around the house for the medications.    An After Visit Summary was printed and given to the patient.    Taline Nass A. Gweneth Fritter, DNP

## 2018-01-08 NOTE — Patient Instructions (Addendum)
Dementia: Care Instructions  Your Care Instructions    Dementia is a loss of mental skills that affects your daily life. It is different than the occasional trouble with memory that is part of aging. You may find it hard to remember things that you feel you should be able to remember. Or you may feel that your mind is just not working as well as usual.  Finding out that you have dementia is a shock. You may be afraid and worried about how the condition will change your life. Although there is no cure at this time, medicine may slow memory loss and improve thinking for a while. Other medicines may be able to help you sleep or cope with depression and behavior changes.  Dementia often gets worse slowly. But it can get worse quickly. As dementia gets worse, it may become harder to do common things that take planning, like making a list and going shopping. Over time, the disease may make it hard for you to take care of yourself. Some people with dementia need others to help care for them.  Dementia is different for everyone. You may be able to function well for a long time. In the early stage of the condition, you can do things at home to make life easier and safer. You also can keep doing your hobbies and other activities. Many people find comfort in planning now for their future needs.  Follow-up care is a key part of your treatment and safety. Be sure to make and go to all appointments, and call your doctor if you are having problems. It's also a good idea to know your test results and keep a list of the medicines you take.  How can you care for yourself at home?  ?? Take your medicines exactly as prescribed. Call your doctor if you think you are having a problem with your medicine.  ?? Eat healthy foods. Eat lots of whole grains, fruits, and vegetables every day. If you are not hungry, try snacks or nutritional drinks such as Boost, Ensure, or Sustacal.  ?? If you have problems sleeping:   ? Try not to nap too close to your bedtime.  ? Exercise regularly. Walking is a good choice.  ? Try a glass of warm milk or caffeine-free herbal tea before bed.  ?? Do tasks and activities during the time of day when you feel your best. It may help to develop a daily routine.  ?? Post labels, lists, and sticky notes to help you remember things. Write your activities on a calendar you can easily find. Put your clock where you can easily see it.  ?? Stay active. Take walks in familiar places, or with friends or loved ones. Try to stay active mentally too. Read and work crossword puzzles if you enjoy these activities.  ?? Do not drive unless you can pass an on-road driving test. If you are not sure if you are safe to drive, your state driver's license bureau can test you.  ?? Keep a cordless phone and a flashlight with new batteries by your bed. If possible, put a phone in each of the main rooms of your house, or carry a cell phone in case you fall and cannot reach a phone. Or, you can wear a device around your neck or wrist. You push a button that sends a signal for help.  Acknowledge your emotions and plan for the future  ?? Talk openly and honestly with your doctor.  ??   Let yourself grieve. It is common to feel angry, scared, frustrated, anxious, or depressed.  ?? Get emotional support from family, friends, a support group, or a counselor experienced in working with people who have dementia.  ?? Ask for help if you need it.  ?? Plan for the future.  ? Talk to your family and doctor about preparing a living will and other important papers while you can make decisions. These papers tell your doctors how to care for you at the end of your life.  ? Consider naming a person to make decisions about your care if you are not able to.  When should you call for help?  Call 911 anytime you think you may need emergency care. For example, call if:  ?? ?? You are lost and do not know whom to call.    ?? ?? You are injured and do not know whom to call.   ??Call your doctor now or seek immediate medical care if:  ?? ?? You are more confused or upset than usual.   ?? ?? You feel like you could hurt yourself because your mind is not working well.   ??Watch closely for changes in your health, and be sure to contact your doctor if you have any problems.  Where can you learn more?  Go to [employer].  Enter [phone] in the search box to learn more about "Dementia: Care Instructions."  Current as of: April 24, 2017  Content Version: 11.9  ?? 2006-2018 [employer]. Care instructions adapted under license by [employer] (which disclaims liability or warranty for this information). If you have questions about a medical condition or this instruction, always ask your healthcare professional. [employer] disclaims any warranty or liability for your use of this information.         High Cholesterol: Care Instructions  Your Care Instructions    Cholesterol is a type of fat in your blood. It is needed for many body functions, such as making new cells. Cholesterol is made by your body. It also comes from food you eat. High cholesterol means that you have too much of the fat in your blood. This raises your risk of a heart attack and stroke.  LDL and HDL are part of your total cholesterol. LDL is the "bad" cholesterol. High LDL can raise your risk for heart disease, heart attack, and stroke. HDL is the "good" cholesterol. It helps clear bad cholesterol from the body. High HDL is linked with a lower risk of heart disease, heart attack, and stroke.  Your cholesterol levels help your doctor find out your risk for having a heart attack or stroke. You and your doctor can talk about whether you need to lower your risk and what treatment is best for you.  A heart-healthy lifestyle along with medicines can help lower your  cholesterol and your risk. The way you choose to lower your risk will depend on how high your risk is for heart attack and stroke. It will also depend on how you feel about taking medicines.  Follow-up care is a key part of your treatment and safety. Be sure to make and go to all appointments, and call your doctor if you are having problems. It's also a good idea to know your test results and keep a list of the medicines you take.  How can you care for yourself at home?  ?? Eat a variety of foods every day. Good choices include fruits, vegetables, whole grains (like  oatmeal), dried beans and peas, nuts and seeds, soy products (like tofu), and fat-free or low-fat dairy products.  ?? Replace butter, margarine, and hydrogenated or partially hydrogenated oils with olive and canola oils. (Canola oil margarine without trans fat is fine.)  ?? Replace red meat with fish, poultry, and soy protein (like tofu).  ?? Limit processed and packaged foods like chips, crackers, and cookies.  ?? Bake, broil, or steam foods. Don't fry them.  ?? Be physically active. Get at least 30 minutes of exercise on most days of the week. Walking is a good choice. You also may want to do other activities, such as running, swimming, cycling, or playing tennis or team sports.  ?? Stay at a healthy weight or lose weight by making the changes in eating and physical activity listed above. Losing just a small amount of weight, even 5 to 10 pounds, can reduce your risk for having a heart attack or stroke.  ?? Do not smoke.  When should you call for help?  Watch closely for changes in your health, and be sure to contact your doctor if:  ?? ?? You need help making lifestyle changes.   ?? ?? You have questions about your medicine.   Where can you learn more?  Go to [employer].  Enter [phone] in the search box to learn more about "High Cholesterol: Care Instructions."  Current as of: March 04, 2017  Content Version: 11.9   ?? 2006-2018 [employer]. Care instructions adapted under license by [employer] (which disclaims liability or warranty for this information). If you have questions about a medical condition or this instruction, always ask your healthcare professional. [employer] disclaims any warranty or liability for your use of this information.         High Blood Pressure: Care Instructions  Overview    It's normal for blood pressure to go up and down throughout the day. But if it stays up, you have high blood pressure. Another name for high blood pressure is hypertension.  Despite what a lot of people think, high blood pressure usually doesn't cause headaches or make you feel dizzy or lightheaded. It usually has no symptoms. But it does increase your risk of stroke, heart attack, and other problems. You and your doctor will talk about your risks of these problems based on your blood pressure.  Your doctor will give you a goal for your blood pressure. Your goal will be based on your health and your age.  Lifestyle changes, such as eating healthy and being active, are always important to help lower blood pressure. You might also take medicine to reach your blood pressure goal.  Follow-up care is a key part of your treatment and safety. Be sure to make and go to all appointments, and call your doctor if you are having problems. It's also a good idea to know your test results and keep a list of the medicines you take.  How can you care for yourself at home?  Medical treatment  ?? If you stop taking your medicine, your blood pressure will go back up. You may take one or more types of medicine to lower your blood pressure. Be safe with medicines. Take your medicine exactly as prescribed. Call your doctor if you think you are having a problem with your medicine.  ?? Talk to your doctor before you start taking aspirin every day. Aspirin  can help certain people lower their risk of a heart attack or stroke. But  taking aspirin isn't right for everyone, because it can cause serious bleeding.  ?? See your doctor regularly. You may need to see the doctor more often at first or until your blood pressure comes down.  ?? If you are taking blood pressure medicine, talk to your doctor before you take decongestants or anti-inflammatory medicine, such as ibuprofen. Some of these medicines can raise blood pressure.  ?? Learn how to check your blood pressure at home.  Lifestyle changes  ?? Stay at a healthy weight. This is especially important if you put on weight around the waist. Losing even 10 pounds can help you lower your blood pressure.  ?? If your doctor recommends it, get more exercise. Walking is a good choice. Bit by bit, increase the amount you walk every day. Try for at least 30 minutes on most days of the week. You also may want to swim, bike, or do other activities.  ?? Avoid or limit alcohol. Talk to your doctor about whether you can drink any alcohol.  ?? Try to limit how much sodium you eat to less than 2,300 milligrams (mg) a day. Your doctor may ask you to try to eat less than 1,500 mg a day.  ?? Eat plenty of fruits (such as bananas and oranges), vegetables, legumes, whole grains, and low-fat dairy products.  ?? Lower the amount of saturated fat in your diet. Saturated fat is found in animal products such as milk, cheese, and meat. Limiting these foods may help you lose weight and also lower your risk for heart disease.  ?? Do not smoke. Smoking increases your risk for heart attack and stroke. If you need help quitting, talk to your doctor about stop-smoking programs and medicines. These can increase your chances of quitting for good.  When should you call for help?  Call 911 anytime you think you may need emergency care. This may mean having symptoms that suggest that your blood pressure is causing a serious  heart or blood vessel problem. Your blood pressure may be over 180/120.  ??For example, call 911 if:  ?? ?? You have symptoms of a heart attack. These may include:  ? Chest pain or pressure, or a strange feeling in the chest.  ? Sweating.  ? Shortness of breath.  ? Nausea or vomiting.  ? Pain, pressure, or a strange feeling in the back, neck, jaw, or upper belly or in one or both shoulders or arms.  ? Lightheadedness or sudden weakness.  ? A fast or irregular heartbeat.   ?? ?? You have symptoms of a stroke. These may include:  ? Sudden numbness, tingling, weakness, or loss of movement in your face, arm, or leg, especially on only one side of your body.  ? Sudden vision changes.  ? Sudden trouble speaking.  ? Sudden confusion or trouble understanding simple statements.  ? Sudden problems with walking or balance.  ? A sudden, severe headache that is different from past headaches.   ?? ?? You have severe back or belly pain.   ??Do not wait until your blood pressure comes down on its own. Get help right away.  ??Call your doctor now or seek immediate care if:  ?? ?? Your blood pressure is much higher than normal (such as 180/120 or higher), but you don't have symptoms.   ?? ?? You think high blood pressure is causing symptoms, such as:  ? Severe headache.  ? Blurry vision.   ??Watch closely for changes in your health,  and be sure to contact your doctor if:  ?? ?? Your blood pressure measures higher than your doctor recommends at least 2 times. That means the top number is higher or the bottom number is higher, or both.   ?? ?? You think you may be having side effects from your blood pressure medicine.   Where can you learn more?  Go to [employer].  Enter [phone] in the search box to learn more about "High Blood Pressure: Care Instructions."  Current as of: March 04, 2017  Content Version: 11.9  ?? 2006-2018 [employer]. Care instructions adapted under  license by [employer] (which disclaims liability or warranty for this information). If you have questions about a medical condition or this instruction, always ask your healthcare professional. [employer] disclaims any warranty or liability for your use of this information.         Seizure: Care Instructions  Your Care Instructions    Seizures are caused by abnormal patterns of electrical signals in the brain. They are different for each person.  Seizures can affect movement, speech, vision, or awareness. Some people have only slight shaking of a hand and do not pass out. Other people may pass out and have violent shaking of the whole body. Some people appear to stare into space. They are awake, but they can't respond normally. Later, they may not remember what happened.  You may need tests to identify the type and cause of the seizures.  A seizure may occur only once, or you may have them more than one time. Taking medicines as directed and following up with your doctor may help keep you from having more seizures.  The doctor has checked you carefully, but problems can develop later. If you notice any problems or new symptoms, get medical treatment right away.  Follow-up care is a key part of your treatment and safety. Be sure to make and go to all appointments, and call your doctor if you are having problems. It's also a good idea to know your test results and keep a list of the medicines you take.  How can you care for yourself at home?  ?? Be safe with medicines. Take your medicines exactly as prescribed. Call your doctor if you think you are having a problem with your medicine.  ?? Do not do any activity that could be dangerous to you or others until your doctor says it is safe to do so. For example, do not drive a car, operate machinery, swim, or climb ladders.  ?? Be sure that anyone treating you for any health problem knows that you  have had a seizure and what medicines you are taking for it.  ?? Identify and avoid things that may make you more likely to have a seizure. These may include lack of sleep, alcohol or drug use, stress, or not eating.  ?? Make sure you go to your follow-up appointment.  When should you call for help?  Call 911 anytime you think you may need emergency care. For example, call if:  ?? ?? You have another seizure.   ?? ?? You have more than one seizure in 24 hours.   ?? ?? You have new symptoms, such as trouble walking, speaking, or thinking clearly.   ??Call your doctor now or seek immediate medical care if:  ?? ?? You are not acting normally.   ??Watch closely for changes in your health, and be sure to contact your doctor if you have  any problems.  Where can you learn more?  Go to [employer].  Enter [phone] in the search box to learn more about "Seizure: Care Instructions."  Current as of: January 14, 2017  Content Version: 11.9  ?? 2006-2018 [employer]. Care instructions adapted under license by [employer] (which disclaims liability or warranty for this information). If you have questions about a medical condition or this instruction, always ask your healthcare professional. [employer] disclaims any warranty or liability for your use of this information.         Well Visit, Over 65: Care Instructions  Your Care Instructions    Physical exams can help you stay healthy. Your doctor has checked your overall health and may have suggested ways to take good care of yourself. He or she also may have recommended tests. At home, you can help prevent illness with healthy eating, regular exercise, and other steps.  Follow-up care is a key part of your treatment and safety. Be sure to make and go to all appointments, and call your doctor if you are having problems. It's also a good idea to know your test results and keep a list of the medicines you take.   How can you care for yourself at home?  ?? Reach and stay at a healthy weight. This will lower your risk for many problems, such as obesity, diabetes, heart disease, and high blood pressure.  ?? Get at least 30 minutes of exercise on most days of the week. Walking is a good choice. You also may want to do other activities, such as running, swimming, cycling, or playing tennis or team sports.  ?? Do not smoke. Smoking can make health problems worse. If you need help quitting, talk to your doctor about stop-smoking programs and medicines. These can increase your chances of quitting for good.  ?? Protect your skin from too much sun. When you're outdoors from 10 a.m. to 4 p.m., stay in the shade or cover up with clothing and a hat with a wide brim. Wear sunglasses that block UV rays. Even when it's cloudy, put broad-spectrum sunscreen (SPF 30 or higher) on any exposed skin.  ?? See a dentist one or two times a year for checkups and to have your teeth cleaned.  ?? Wear a seat belt in the car.  ?? Limit alcohol to 2 drinks a day for men and 1 drink a day for women. Too much alcohol can cause health problems.  Follow your doctor's advice about when to have certain tests. These tests can spot problems early.  For men and women  ?? Cholesterol. Your doctor will tell you how often to have this done based on your overall health and other things that can increase your risk for heart attack and stroke.  ?? Blood pressure. Have your blood pressure checked during a routine doctor visit. Your doctor will tell you how often to check your blood pressure based on your age, your blood pressure results, and other factors.  ?? Diabetes. Ask your doctor whether you should have tests for diabetes.  ?? Vision. Experts recommend that you have yearly exams for glaucoma and other age-related eye problems.  ?? Hearing. Tell your doctor if you notice any change in your hearing. You can have tests to find out how well you hear.   ?? Colon cancer tests. Keep having colon cancer tests as your doctor recommends. You can have one of several types of tests.  ?? Heart attack and stroke  risk. At least every 4 to 6 years, you should have your risk for heart attack and stroke assessed. Your doctor uses factors such as your age, blood pressure, cholesterol, and whether you smoke or have diabetes to show what your risk for a heart attack or stroke is over the next 10 years.  ?? Osteoporosis. Talk to your doctor about whether you should have a bone density test to find out whether you have thinning bones. Also ask your doctor about whether you should take calcium and vitamin D supplements.  For women  ?? Pap test and pelvic exam. You may no longer need a Pap test. Talk with your doctor about whether to stop or continue to have Pap tests.  ?? Breast exam and mammogram. Ask how often you should have a mammogram, which is an X-ray of your breasts. A mammogram can spot breast cancer before it can be felt and when it is easiest to treat.  ?? Thyroid disease. Talk to your doctor about whether to have your thyroid checked as part of a regular physical exam. Women have an increased chance of a thyroid problem.  For men  ?? Prostate exam. Talk to your doctor about whether you should have a blood test (called a PSA test) for prostate cancer. Experts disagree on whether men should have this test. Some experts recommend that you discuss the benefits and risks of the test with your doctor.  ?? Abdominal aortic aneurysm. Ask your doctor whether you should have a test to check for an aneurysm. You may need a test if you ever smoked or if your parent, brother, sister, or child has had an aneurysm.  When should you call for help?  Watch closely for changes in your health, and be sure to contact your doctor if you have any problems or symptoms that concern you.  Where can you learn more?  Go to [employer].   Enter [phone] in the search box to learn more about "Well Visit, Over 65: Care Instructions."  Current as of: November 08, 2016  Content Version: 11.9  ?? 2006-2018 [employer]. Care instructions adapted under license by [employer] (which disclaims liability or warranty for this information). If you have questions about a medical condition or this instruction, always ask your healthcare professional. [employer] disclaims any warranty or liability for your use of this information.

## 2018-01-08 NOTE — Progress Notes (Signed)
Chief Complaint   Patient presents with   ??? Hospital Follow Up     possible seizure   ??? Annual Wellness Visit   ??? Labs     1. Have you been to the ER, urgent care clinic since your last visit?  Hospitalized since your last visit?No    2. Have you seen or consulted any other health care providers outside of the [facility] since your last visit?  Include any pap smears or colon screening. No

## 2018-01-08 NOTE — Progress Notes (Signed)
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 smoke.   When should you call for help?     Watch closely for changes in your health, and be sure to contact your doctor if:      ??  ??  You need help making lifestyle changes.     ??  ??  You have questions about your medicine.     Where can you learn more?   Go to [employer].   Enter [id number] in the search box to learn more about "High Cholesterol: Care Instructions."   Current as of: March 04, 2017   Content Version: 11.9   ?? 2006-2018 [employer]. Care instructions adapted under license by [employer] (which disclaims liability or warranty for this information). If you have questions about a medical condition or this instruction, always ask your  healthcare professional. [employer] disclaims any warranty or liability for your use of this information.            High Blood Pressure: Care Instructions   Overview        It's normal for blood pressure to go up and down throughout the day. But if it stays up, you have high blood pressure. Another name for high blood pressure is hypertension.   Despite what a lot of people think, high blood pressure usually doesn't cause headaches or make you feel dizzy or lightheaded. It usually has no symptoms. But it does increase your risk of stroke, heart attack, and other  problems. You and your doctor  will talk about your risks of these problems based on your blood pressure.   Your doctor will give you a  goal for your blood pressure. Your goal will be based on your health and your age.   Lifestyle changes, such as eating healthy and being active, are always important to help lower blood pressure. You might also take medicine to reach your blood pressure goal.   Follow-up care is a key part of your treatment and safety. Be sure to make and go to all appointments, and call your doctor if you are having problems. It's also a good idea to  know your test results and keep a list of the medicines you take.   How can you care for yourself at home?   Medical treatment   ??  If you stop taking your medicine, your blood pressure will go back up. You may take one or more types of medicine to lower your blood pressure. Be safe with medicines. Take  your medicine exactly as prescribed. Call your doctor if you think you are having a problem with your medicine.   ??  Talk to your doctor before you start taking aspirin every day. Aspirin can help certain people lower their risk of a heart attack or stroke. But taking aspirin isn't right for everyone, because it can cause serious bleeding.   ??  See your doctor regularly. You may need to see the doctor more often at first or until your blood pressure comes down.   ??  If you are taking blood pressure medicine, talk to your doctor before you take decongestants or anti-inflammatory medicine, such as ibuprofen. Some of these medicines can raise blood pressure.   ??  Learn how to check your blood pressure at home.   Lifestyle changes   ??  Stay at a healthy weight. This is especially important if you put on weight around the waist. Losing even 10 pounds can help you lower your blood pressure.   ??  If your doctor recommends it, get more exercise. Walking is a good choice. Bit by bit, increase the amount you walk every day. Try for at least 30 minutes on most  days of the week. You also may want to swim, bike, or do other activities.   ??  Avoid or limit alcohol. Talk to your doctor about whether you can drink any alcohol.   ??  Try to limit how much sodium you eat to less than 2,300 milligrams (mg) a day. Your doctor may ask you to try to eat less than 1,500 mg a day.   ??  Eat plenty of fruits (such as bananas and oranges), vegetables, legumes, whole grains, and low-fat dairy products.   ??  Lower the amount of saturated fat in your diet. Saturated fat is found in animal products such as milk, cheese, and meat. Limiting these foods may help you lose weight and also lower your risk for heart disease.   ??  Do not smoke. Smoking increases your risk for heart attack and stroke. If you need help quitting, talk to your doctor about stop-smoking programs and medicines. These can increase your chances of quitting for good.   When should you call for help?     Call 911 anytime you think you may need emergency care. This may mean having symptoms that suggest that your blood pressure is causing a serious heart or blood vessel problem. Your  blood pressure may be over 180/120.   ??For example, call 911 if:      ??  ??  You have symptoms of a heart attack. These may include:   ?  Chest pain or pressure, or a strange feeling in the chest.   ?  Sweating.   ?  Shortness of breath.   ?  Nausea or vomiting.   ?  Pain, pressure, or a strange feeling in the back, neck, jaw, or upper belly or in one or both shoulders or arms.   ?  Lightheadedness or sudden weakness.   ?  A fast or irregular heartbeat.     ??  ??  You have symptoms of a stroke. These may include:   ?  Sudden numbness, tingling, weakness, or loss of movement in your face, arm, or leg, especially on only one side of your body.   ?  Sudden vision changes.   ?  Sudden trouble speaking.   ?  Sudden confusion or trouble understanding simple statements.   ?  Sudden problems with walking or balance.   ?  A sudden, severe headache that is  different from past headaches.     ??  ??  You have severe back or belly pain.     ??Do not wait until your blood pressure comes down on its own. Get help right away.   ??Call your doctor now or seek immediate care if:      ??  ??  Your blood pressure is much higher than normal (such as 180/120 or higher), but you don't have symptoms.        ??  ??  You think high blood pressure is causing symptoms, such as:   ?  Severe headache.   ?  Blurry vision.     ??Watch closely for changes in your health, and be sure to contact your doctor if:      ??  ??  Your blood pressure measures higher than your doctor recommends at least 2 times. That means the top number is higher or the bottom number is higher, or both.     ??  ??  You think you may be having side effects from your blood pressure medicine.     Where can you learn more?   Go to [employer].   Enter [phone] in the search box to learn more about "High Blood Pressure: Care Instructions."   Current as of: March 04, 2017   Content Version: 11.9   ?? 2006-2018 [employer]. Care instructions adapted under license by [employer] (which disclaims liability or warranty for this information). If you have questions about a medical condition or this instruction, always ask your  healthcare professional. [employer] disclaims any warranty or liability for your use of this information.            Seizure: Care Instructions   Your Care Instructions        Seizures are caused by abnormal patterns of electrical signals in the brain. They are different for each person.   Seizures can affect movement, speech, vision, or awareness. Some people have only slight shaking of a hand and do not pass out. Other people may pass out and have violent shaking of the whole body. Some people appear to stare into space. They are awake,  but they can't respond normally. Later, they may not remember what happened.   You may need tests to identify the  type and cause of the seizures.   A seizure may occur only once, or you may have them more than one time. Taking medicines as directed and following up with your doctor may help keep you from having more  seizures.   The doctor has checked you carefully, but problems can develop later. If you notice any problems or new symptoms, get medical treatment right away.   Follow-up care is a key part of your treatment and safety. Be sure to make and go to all appointments, and call your doctor if you are having problems. It's also a good idea to  know your test results and keep a list of the medicines you take.   How can you care for yourself at home?   ??  Be safe with medicines. Take your medicines exactly as prescribed. Call your doctor if you think you are having a problem with your medicine.   ??  Do not do any activity that could be dangerous to you or others until your doctor says it is safe to do so. For example, do not drive a car, operate machinery, swim, or climb ladders.   ??  Be sure that anyone treating you for any health problem knows that you have had a seizure and what medicines you are taking for it.   ??  Identify and avoid things that may make you more likely to have a seizure. These may include lack of sleep, alcohol or drug use, stress, or not eating.   ??  Make sure you go to your follow-up appointment.   When should you call for help?     Call 911 anytime you think you may need emergency care. For example, call if:      ??  ??  You have another seizure.     ??  ??  You have more than one seizure in 24 hours.     ??  ??  You have new symptoms, such as trouble walking, speaking, or thinking clearly.     ??Call your doctor now or seek immediate medical care if:      ??  ??  You are not acting normally.     ??Watch closely for changes in your health, and be sure to contact your doctor if you have any problems.   Where can you learn more?   Go to [employer].   Enter [phone] in the search  box to learn more about "Seizure: Care Instructions."   Current as of: January 14, 2017   Content Version: 11.9   ?? 2006-2018 [employer]. Care instructions adapted under license by [employer] (which disclaims liability or warranty for this information). If you have questions about a medical condition or this instruction, always ask your  healthcare professional. [employer] disclaims any warranty or liability for your use of this information.            Well Visit, Over 65: Care Instructions   Your Care Instructions        Physical exams can help you stay healthy. Your doctor has checked your overall health and may have suggested ways to take good care of yourself. He or she also may have recommended tests. At home, you can help prevent illness with healthy eating, regular  exercise, and other steps.   Follow-up care is a key part of your treatment and safety. Be sure to make and go to all appointments, and call your doctor if you are having problems. It's also a good idea to  know your test results and keep a list of the medicines you take.   How can you care for yourself at home?   ??  Reach and stay at a healthy weight.  This will lower your risk for many problems, such as obesity, diabetes, heart disease, and high blood pressure.   ??  Get at least 30 minutes of exercise on most days of the week. Walking is a good choice. You also may want to do other activities, such as running, swimming, cycling, or playing tennis or team sports.   ??  Do not smoke. Smoking can make health problems worse. If you need help quitting, talk to your doctor about stop-smoking programs and medicines. These can increase your chances of quitting for good.   ??  Protect your skin from too much sun. When you're outdoors from 10 a.m. to 4 p.m., stay in the shade or cover up with clothing and a hat with a wide brim. Wear sunglasses that block UV rays. Even when it's cloudy, put broad-spectrum sunscreen (SPF 30  or  higher) on any exposed skin.   ??  See a dentist one or two times a year for checkups and to have your teeth cleaned.   ??  Wear a seat belt in the car.   ??  Limit alcohol to 2 drinks a day for men and 1 drink a day for women. Too much alcohol can cause health problems.   Follow your doctor's advice about when to have certain tests. These tests can spot problems early.   For men and women   ??  Cholesterol. Your doctor will tell you how often to have this done based on your overall health and other things  that can increase your risk for heart attack and stroke.   ??  Blood pressure. Have your blood pressure checked during a routine doctor visit. Your doctor will tell you how often to check your blood pressure based on your age, your blood pressure  results, and other factors.   ??  Diabetes. Ask your doctor whether you should have tests for diabetes.   ??  Vision. Experts recommend that you have yearly exams for glaucoma and other age-related eye problems.   ??  Hearing. Tell your doctor if you notice any change in your hearing. You can have tests to find out how well you hear.   ??  Colon cancer tests. Keep having colon cancer tests as your doctor recommends. You can have one of several types of tests.   ??  Heart attack and stroke risk. At least every 4 to 6 years, you should have your risk for heart attack and stroke assessed. Your doctor uses factors such as your age, blood pressure,  cholesterol, and whether you smoke or have diabetes to show what your risk for a heart attack or stroke is over the next 10 years.   ??  Osteoporosis. Talk to your doctor about whether you should have a bone density test to find out whether you have thinning bones. Also ask your doctor about whether you should take  calcium and vitamin D supplements.   For women   ??  Pap test and pelvic exam. You may no longer need a Pap test. Talk with your doctor about whether to stop or continue  to have Pap tests.   ??  Breast exam and mammogram. Ask  how often you should have a mammogram, which is an X-ray of your breasts. A mammogram can spot breast cancer before it can be felt and when it is  easiest to treat.   ??  Thyroid disease. Talk to your doctor about whether to have your thyroid checked as part  of a regular physical exam. Women have an increased chance of a thyroid problem.   For men   ??  Prostate exam. Talk to your doctor about whether you should have a blood test (called a PSA test) for prostate  cancer. Experts disagree on whether men should have this test. Some experts recommend that you discuss the benefits and risks of the test with your doctor.   ??  Abdominal aortic aneurysm. Ask your doctor whether you should have a test to check for an aneurysm. You may need a test if you ever smoked or if your parent, brother, sister, or  child has had an aneurysm.   When should you call for help?     Watch closely for changes in your health, and be sure to contact your doctor if you have any problems or symptoms that concern you.   Where can you learn more?   Go to [employer].   Enter [phone] in the search box to learn more about "Well Visit, Over 65: Care Instructions."   Current as of: November 08, 2016   Content Version: 11.9   ?? 2006-2018 [employer]. Care instructions adapted under license by [employer] (which disclaims liability or warranty for this information). If you have questions about a medical condition or this instruction, always ask your  healthcare professional. [employer] disclaims any warranty or liability for your use of this information.               Follow-up and Dispositions      ??  Return in about 2 weeks (around 01/22/2018), or if symptoms worsen or fail to improve, for discuss results; seizure disorder; htn; DM;  dementia.                I have reviewed with the patient details of the assessment and plan and all questions were answered. Relevent patient education was  performed      I have reviewed with patient and her sister her current medications. Sister states patient lost her pill bottles, but has prefilled medications in her pill box. She will continue to look around the house for the medications.      An After Visit Summary was printed and given to the patient.      Meghan Tejada A. Gweneth Fritter, DNP

## 2018-01-08 NOTE — Progress Notes (Signed)
Chief Complaint   Patient presents with   . Hospital Follow Up     possible seizure   . Annual Wellness Visit   . Labs     1. Have you been to the ER, urgent care clinic since your last visit?  Hospitalized since your last visit?No    2. Have you seen or consulted any other health care providers outside of the [facility] since your last visit?  Include any pap smears or colon screening. No

## 2018-01-09 LAB — CBC W/O DIFF
HCT: 37.2 % (ref 34.0–46.6)
HGB: 11.8 g/dL (ref 11.1–15.9)
MCH: 30.7 pg (ref 26.6–33.0)
MCHC: 31.7 g/dL (ref 31.5–35.7)
MCV: 97 fL (ref 79–97)
PLATELET: 419 10*3/uL (ref 150–450)
RBC: 3.84 x10E6/uL (ref 3.77–5.28)
RDW: 15.2 % (ref 12.3–15.4)
WBC: 6 10*3/uL (ref 3.4–10.8)

## 2018-01-09 LAB — METABOLIC PANEL, COMPREHENSIVE
A-G Ratio: 1.5 (ref 1.2–2.2)
ALT (SGPT): 22 IU/L (ref 0–32)
AST (SGOT): 32 IU/L (ref 0–40)
Albumin: 3.9 g/dL (ref 3.6–4.8)
Alk. phosphatase: 43 IU/L (ref 39–117)
BUN/Creatinine ratio: 15 (ref 12–28)
BUN: 11 mg/dL (ref 8–27)
Bilirubin, total: 0.3 mg/dL (ref 0.0–1.2)
CO2: 25 mmol/L (ref 20–29)
Calcium: 9.8 mg/dL (ref 8.7–10.3)
Chloride: 103 mmol/L (ref 96–106)
Creatinine: 0.72 mg/dL (ref 0.57–1.00)
GFR est AA: 99 mL/min/{1.73_m2} (ref >59)
GFR est non-AA: 86 mL/min/{1.73_m2} (ref >59)
GLOBULIN, TOTAL: 2.6 g/dL (ref 1.5–4.5)
Glucose: 69 mg/dL (ref 65–99)
Potassium: 4.4 mmol/L (ref 3.5–5.2)
Protein, total: 6.5 g/dL (ref 6.0–8.5)
Sodium: 143 mmol/L (ref 134–144)

## 2018-01-09 LAB — VALPROIC ACID
Valproic Acid: 79 ug/mL (ref 50–100)
Valproic acid: 79 ug/mL (ref 50–100)

## 2018-01-09 LAB — COMPREHENSIVE METABOLIC PANEL
ALT: 22 IU/L (ref 0–32)
AST: 32 IU/L (ref 0–40)
Albumin/Globulin Ratio: 1.5 NA (ref 1.2–2.2)
Albumin: 3.9 g/dL (ref 3.6–4.8)
Alkaline Phosphatase: 43 IU/L (ref 39–117)
BUN: 11 mg/dL (ref 8–27)
Bun/Cre Ratio: 15 NA (ref 12–28)
CO2: 25 mmol/L (ref 20–29)
Calcium: 9.8 mg/dL (ref 8.7–10.3)
Chloride: 103 mmol/L (ref 96–106)
Creatinine: 0.72 mg/dL (ref 0.57–1.00)
EGFR IF NonAfrican American: 86 mL/min/{1.73_m2} (ref >59)
GFR African American: 99 mL/min/{1.73_m2} (ref >59)
Globulin, Total: 2.6 g/dL (ref 1.5–4.5)
Glucose: 69 mg/dL (ref 65–99)
Potassium: 4.4 mmol/L (ref 3.5–5.2)
Sodium: 143 mmol/L (ref 134–144)
Total Bilirubin: 0.3 mg/dL (ref 0.0–1.2)
Total Protein: 6.5 g/dL (ref 6.0–8.5)

## 2018-01-09 LAB — CBC
Hematocrit: 37.2 % (ref 34.0–46.6)
Hemoglobin: 11.8 g/dL (ref 11.1–15.9)
MCH: 30.7 pg (ref 26.6–33.0)
MCHC: 31.7 g/dL (ref 31.5–35.7)
MCV: 97 fL (ref 79–97)
Platelets: 419 10*3/uL (ref 150–450)
RBC: 3.84 x10E6/uL (ref 3.77–5.28)
RDW: 15.2 % (ref 12.3–15.4)
WBC: 6 10*3/uL (ref 3.4–10.8)

## 2018-01-09 NOTE — Telephone Encounter (Signed)
Call to pt's family.  Informed them that order for BP cuff was done and ready for pt.  Explained that I could either send or family could pick up order.  Pt's sister stated she would pick up order when she got off work.  Stated order would be at front desk for her.  Sister stated understanding of this info.

## 2018-01-11 ENCOUNTER — Encounter

## 2018-01-11 MED ORDER — VIMPAT 100 MG TABLET
100 mg | ORAL_TABLET | ORAL | 0 refills | Status: DC
Start: 2018-01-11 — End: 2018-02-20

## 2018-01-11 MED ORDER — GABAPENTIN 100 MG CAP
100 mg | ORAL_CAPSULE | ORAL | 0 refills | Status: DC
Start: 2018-01-11 — End: 2018-02-20

## 2018-01-17 NOTE — Progress Notes (Signed)
This Clinical research associatewriter reaches out to pt's sister Meghan [name] as follow up to our last conversation. Pt's name addr and dob verified as pt identifiers.   Pt is reported as doing wonderful. Seizures and wondering are denied.   Medicaid application was applied for on 5/29  This writer schedules colonoscopy: 7/2 @ 10    Goals Addressed                 This Visit's Progress    ??? Identification of barriers to adherence to a plan of care such as inability to afford medications, lack of insurance, lack of transportation, etc.        While admitted pt was prescribed PRN Hydralazine for those times Systolic > 170 or Diastolic >110. Per Meghan [name], pt does not have a home bp cuff. This Clinical research associatewriter explains the importance of getting a cuff and how it relates to this new medication. This Clinical research associatewriter advises will ask PCP to prescribe a BP cuff at 5/28 toc. Meghan [name] understands it's importance and will have new script filled by 6/7. Next step will involve setting a reg schedule for monitoring pt's BP, creating a log and when to report to providers. Reassess by 6/7 (Meghan)    6/6: Pt's sister has attempted to obtain a BP cuff from the local pharmacy but informed the document she presented wasn't correct.This Clinical research associatewriter will ask PCP to had write a prescription or reorder to reflect on prescription paper. Meghan [name] will be contacted when new script is ready. Signs/symptoms of elevated bp are denied today. Reassess by 6/13 (Meghan).       ??? COMPLETED: Knowledge and adherence of prescribed medication (ie. action, side effects, missed dose, etc.).        5/24  Meghan [name] has a history of dementia, seizures, depression and diabetes. She lives alone in a new apartment that all amenities on one floor. This helps decrease pt's risk of falls. Family members check on her regularly. Her medications are placed in a pill box and administered by her sister Meghan [name]. Due pt's current mental state she tends to move things/hide  things including her medications from those supporting her. Meghan [name] has agreed to bring pt's medication bottles to 5/28 TOC for review and pick up the new Hydralazine script. Reassess by 5/30 (Meghan).    5/29  All but [facility] Hydralazine is present. Meghan [name] reports there is enough in pt's pill box until she can locate the missing medication. She will reach out to this office within a week of pt running out of said medications or pt's pharmacy for refill. Reassess by 6/7 (Meghan).      6/6: Meghan [name] has found pt's medication bottles. She reports all dc'd medications have been removed and the pharmacy is aware of current medication list.

## 2018-01-17 NOTE — Progress Notes (Signed)
 This Clinical research associate reaches out to pt's sister Meghan [name] as follow up to our last conversation. Pt's name addr and dob verified as pt identifiers.   Pt is reported as doing wonderful. Seizures and wondering are denied.   Medicaid application was applied for on 5/29  This writer schedules colonoscopy: 7/2 @ 10    Goals Addressed                 This Visit's Progress    . Identification of barriers to adherence to a plan of care such as inability to afford medications, lack of insurance, lack of transportation, etc.        While admitted pt was prescribed PRN Hydralazine  for those times Systolic > 170 or Diastolic >110. Per Meghan [name], pt does not have a home bp cuff. This Clinical research associate explains the importance of getting a cuff and how it relates to this new medication. This Clinical research associate advises will ask PCP to prescribe a BP cuff at 5/28 toc. Meghan [name] understands it's importance and will have new script filled by 6/7. Next step will involve setting a reg schedule for monitoring pt's BP, creating a log and when to report to providers. Reassess by 6/7 (Meghan)    6/6: Pt's sister has attempted to obtain a BP cuff from the local pharmacy but informed the document she presented wasn't correct.This Clinical research associate will ask PCP to had write a prescription or reorder to reflect on prescription paper. Meghan [name] will be contacted when new script is ready. Signs/symptoms of elevated bp are denied today. Reassess by 6/13 (Meghan).       . COMPLETED: Knowledge and adherence of prescribed medication (ie. action, side effects, missed dose, etc.).        5/24  Meghan [name] has a history of dementia, seizures, depression and diabetes. She lives alone in a new apartment that all amenities on one floor. This helps decrease pt's risk of falls. Family members check on her regularly. Her medications are placed in a pill box and administered by her sister Meghan [name]. Due pt's current mental state she tends to move things/hide things including her medications from those  supporting her. Meghan [name] has agreed to bring pt's medication bottles to 5/28 TOC for review and pick up the new Hydralazine  script. Reassess by 5/30 (Meghan).    5/29  All but 3 medication bottles are not avail at [facility] visit. Hydralazine  is present. Meghan [name] reports there is enough in pt's pill box until she can locate the missing medication. She will reach out to this office within a week of pt running out of said medications or pt's pharmacy for refill. Reassess by 6/7 (Meghan).      6/6: Meghan [name] has found pt's medication bottles. She reports all dc'd medications have been removed and the pharmacy is aware of current medication list.

## 2018-01-21 NOTE — Telephone Encounter (Signed)
-----   Message from Jeanella CrazeShenequa C Mitchell sent at 01/21/2018  3:18 PM EDT -----  Regarding: DR Gweneth FritterSEABROOK / TELEPHONE  [name] sister is requesting that a referral for a colonoscopy [facility] ATTN: Ray ; fax [phone]    Best contact: [phone]

## 2018-01-23 ENCOUNTER — Ambulatory Visit
Admit: 2018-01-23 | Discharge: 2018-01-23 | Payer: PRIVATE HEALTH INSURANCE | Attending: Nurse Practitioner | Primary: Internal Medicine

## 2018-01-23 DIAGNOSIS — F0390 Unspecified dementia without behavioral disturbance: Secondary | ICD-10-CM

## 2018-01-23 NOTE — Progress Notes (Signed)
Chart audit for suspect conditions.
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
## 2018-01-23 NOTE — Patient Instructions (Addendum)
Dementia: Care Instructions  Your Care Instructions    Dementia is a loss of mental skills that affects your daily life. It is different than the occasional trouble with memory that is part of aging. You may find it hard to remember things that you feel you should be able to remember. Or you may feel that your mind is just not working as well as usual.  Finding out that you have dementia is a shock. You may be afraid and worried about how the condition will change your life. Although there is no cure at this time, medicine may slow memory loss and improve thinking for a while. Other medicines may be able to help you sleep or cope with depression and behavior changes.  Dementia often gets worse slowly. But it can get worse quickly. As dementia gets worse, it may become harder to do common things that take planning, like making a list and going shopping. Over time, the disease may make it hard for you to take care of yourself. Some people with dementia need others to help care for them.  Dementia is different for everyone. You may be able to function well for a long time. In the early stage of the condition, you can do things at home to make life easier and safer. You also can keep doing your hobbies and other activities. Many people find comfort in planning now for their future needs.  Follow-up care is a key part of your treatment and safety. Be sure to make and go to all appointments, and call your doctor if you are having problems. It's also a good idea to know your test results and keep a list of the medicines you take.  How can you care for yourself at home?  ?? Take your medicines exactly as prescribed. Call your doctor if you think you are having a problem with your medicine.  ?? Eat healthy foods. Eat lots of whole grains, fruits, and vegetables every day. If you are not hungry, try snacks or nutritional drinks such as Boost, Ensure, or Sustacal.  ?? If you have problems sleeping:   ? Try not to nap too close to your bedtime.  ? Exercise regularly. Walking is a good choice.  ? Try a glass of warm milk or caffeine-free herbal tea before bed.  ?? Do tasks and activities during the time of day when you feel your best. It may help to develop a daily routine.  ?? Post labels, lists, and sticky notes to help you remember things. Write your activities on a calendar you can easily find. Put your clock where you can easily see it.  ?? Stay active. Take walks in familiar places, or with friends or loved ones. Try to stay active mentally too. Read and work crossword puzzles if you enjoy these activities.  ?? Do not drive unless you can pass an on-road driving test. If you are not sure if you are safe to drive, your state driver's license bureau can test you.  ?? Keep a cordless phone and a flashlight with new batteries by your bed. If possible, put a phone in each of the main rooms of your house, or carry a cell phone in case you fall and cannot reach a phone. Or, you can wear a device around your neck or wrist. You push a button that sends a signal for help.  Acknowledge your emotions and plan for the future  ?? Talk openly and honestly with your doctor.  ??   Let yourself grieve. It is common to feel angry, scared, frustrated, anxious, or depressed.  ?? Get emotional support from family, friends, a support group, or a counselor experienced in working with people who have dementia.  ?? Ask for help if you need it.  ?? Plan for the future.  ? Talk to your family and doctor about preparing a living will and other important papers while you can make decisions. These papers tell your doctors how to care for you at the end of your life.  ? Consider naming a person to make decisions about your care if you are not able to.  When should you call for help?  Call 911 anytime you think you may need emergency care. For example, call if:  ?? ?? You are lost and do not know whom to call.    ?? ?? You are injured and do not know whom to call.   ??Call your doctor now or seek immediate medical care if:  ?? ?? You are more confused or upset than usual.   ?? ?? You feel like you could hurt yourself because your mind is not working well.   ??Watch closely for changes in your health, and be sure to contact your doctor if you have any problems.  Where can you learn more?  Go to [employer].  Enter [phone] in the search box to learn more about "Dementia: Care Instructions."  Current as of: April 24, 2017  Content Version: 11.9  ?? 2006-2018 [employer]. Care instructions adapted under license by [employer] (which disclaims liability or warranty for this information). If you have questions about a medical condition or this instruction, always ask your healthcare professional. [employer] disclaims any warranty or liability for your use of this information.         Colon Cancer Screening: Care Instructions  Your Care Instructions    Colorectal cancer occurs in the colon or rectum. That's the lower part of your digestive system. It is the second-leading cause of cancer deaths in the [place]. It often starts with small growths called polyps in the colon or rectum. Polyps are usually found with screening tests. Depending on the type of test, any polyps found may be removed during the tests.  Colorectal cancer usually does not cause symptoms at first. But regular tests can help find it early, before it spreads and becomes harder to treat. Experts advise routine tests for colon cancer for people starting at age 60. And they advise people with a higher risk of colon cancer to get tested sooner. Talk with your doctor about when you should start testing. Discuss which tests you need.  Follow-up care is a key part of your treatment and safety. Be sure to make and go to all appointments, and call your doctor if you are having  problems. It's also a good idea to know your test results and keep a list of the medicines you take.  What are the main screening tests for colon cancer?  ?? Stool tests. These include the fecal immunochemical test (FIT) and the fecal occult blood test (FOBT). These tests check stool samples for signs of cancer. If your test is positive, you will need to have a colonoscopy.  ?? Sigmoidoscopy. This test lets your doctor look at the lining of your rectum and the lowest part of your colon. Your doctor uses a lighted tube called a sigmoidoscope. This test can't find cancers or polyps in the upper part of your  colon. In some cases, polyps that are found can be removed. But if your doctor finds polyps, you will need to have a colonoscopy to check the upper part of your colon.  ?? Colonoscopy. This test lets your doctor look at the lining of your rectum and your entire colon. The doctor uses a thin, flexible tool called a colonoscope. It can also be used to remove polyps or get a tissue sample (biopsy).  What tests do you need?  The following guidelines are for people age 53 and over who are not at high risk for colorectal cancer. You may have at least one of these tests as directed by your doctor.  ?? Fecal immunochemical test (FIT) or fecal occult blood test (FOBT) every year  ?? Sigmoidoscopy every 5 years  ?? Colonoscopy every 10 years  If you are age 1 to 44, you can work with your doctor to decide if screening is a good option. If you are age 74 or older, your doctor will likely advise that screening is not helpful.  Talk with your doctor about when you need to be tested. And discuss which tests are right for you.  Your doctor may recommend earlier or more frequent testing if you:  ?? Have had colorectal cancer before.  ?? Have had colon polyps.  ?? Have symptoms of colorectal cancer. These include blood in your stool and changes in your bowel habits.  ?? Have a parent, brother or sister, or child with colon polyps or  colorectal cancer.  ?? Have a bowel disease. This includes ulcerative colitis and Crohn's disease.  ?? Have a rare polyp syndrome that runs in families, such as familial adenomatous polyposis (FAP).  ?? Have had radiation treatments to the belly or pelvis.  When should you call for help?  Watch closely for changes in your health, and be sure to contact your doctor if:  ?? ?? You have any changes in your bowel habits.   ?? ?? You have any problems.   Where can you learn more?  Go to [employer].  Enter M541 in the search box to learn more about "Colon Cancer Screening: Care Instructions."  Current as of: November 07, 2016  Content Version: 11.9  ?? 2006-2018 [employer]. Care instructions adapted under license by [employer] (which disclaims liability or warranty for this information). If you have questions about a medical condition or this instruction, always ask your healthcare professional. [employer] disclaims any warranty or liability for your use of this information.         High Blood Pressure: Care Instructions  Overview    It's normal for blood pressure to go up and down throughout the day. But if it stays up, you have high blood pressure. Another name for high blood pressure is hypertension.  Despite what a lot of people think, high blood pressure usually doesn't cause headaches or make you feel dizzy or lightheaded. It usually has no symptoms. But it does increase your risk of stroke, heart attack, and other problems. You and your doctor will talk about your risks of these problems based on your blood pressure.  Your doctor will give you a goal for your blood pressure. Your goal will be based on your health and your age.  Lifestyle changes, such as eating healthy and being active, are always important to help lower blood pressure. You might also take medicine to reach your blood pressure goal.   Follow-up care is a key part of  your treatment and safety. Be sure to make and go to all appointments, and call your doctor if you are having problems. It's also a good idea to know your test results and keep a list of the medicines you take.  How can you care for yourself at home?  Medical treatment  ?? If you stop taking your medicine, your blood pressure will go back up. You may take one or more types of medicine to lower your blood pressure. Be safe with medicines. Take your medicine exactly as prescribed. Call your doctor if you think you are having a problem with your medicine.  ?? Talk to your doctor before you start taking aspirin every day. Aspirin can help certain people lower their risk of a heart attack or stroke. But taking aspirin isn't right for everyone, because it can cause serious bleeding.  ?? See your doctor regularly. You may need to see the doctor more often at first or until your blood pressure comes down.  ?? If you are taking blood pressure medicine, talk to your doctor before you take decongestants or anti-inflammatory medicine, such as ibuprofen. Some of these medicines can raise blood pressure.  ?? Learn how to check your blood pressure at home.  Lifestyle changes  ?? Stay at a healthy weight. This is especially important if you put on weight around the waist. Losing even 10 pounds can help you lower your blood pressure.  ?? If your doctor recommends it, get more exercise. Walking is a good choice. Bit by bit, increase the amount you walk every day. Try for at least 30 minutes on most days of the week. You also may want to swim, bike, or do other activities.  ?? Avoid or limit alcohol. Talk to your doctor about whether you can drink any alcohol.  ?? Try to limit how much sodium you eat to less than 2,300 milligrams (mg) a day. Your doctor may ask you to try to eat less than 1,500 mg a day.  ?? Eat plenty of fruits (such as bananas and oranges), vegetables, legumes,  whole grains, and low-fat dairy products.  ?? Lower the amount of saturated fat in your diet. Saturated fat is found in animal products such as milk, cheese, and meat. Limiting these foods may help you lose weight and also lower your risk for heart disease.  ?? Do not smoke. Smoking increases your risk for heart attack and stroke. If you need help quitting, talk to your doctor about stop-smoking programs and medicines. These can increase your chances of quitting for good.  When should you call for help?  Call 911 anytime you think you may need emergency care. This may mean having symptoms that suggest that your blood pressure is causing a serious heart or blood vessel problem. Your blood pressure may be over 180/120.  ??For example, call 911 if:  ?? ?? You have symptoms of a heart attack. These may include:  ? Chest pain or pressure, or a strange feeling in the chest.  ? Sweating.  ? Shortness of breath.  ? Nausea or vomiting.  ? Pain, pressure, or a strange feeling in the back, neck, jaw, or upper belly or in one or both shoulders or arms.  ? Lightheadedness or sudden weakness.  ? A fast or irregular heartbeat.   ?? ?? You have symptoms of a stroke. These may include:  ? Sudden numbness, tingling, weakness, or loss of movement in your face, arm, or leg, especially on only one  side of your body.  ? Sudden vision changes.  ? Sudden trouble speaking.  ? Sudden confusion or trouble understanding simple statements.  ? Sudden problems with walking or balance.  ? A sudden, severe headache that is different from past headaches.   ?? ?? You have severe back or belly pain.   ??Do not wait until your blood pressure comes down on its own. Get help right away.  ??Call your doctor now or seek immediate care if:  ?? ?? Your blood pressure is much higher than normal (such as 180/120 or higher), but you don't have symptoms.   ?? ?? You think high blood pressure is causing symptoms, such as:  ? Severe headache.  ? Blurry vision.    ??Watch closely for changes in your health, and be sure to contact your doctor if:  ?? ?? Your blood pressure measures higher than your doctor recommends at least 2 times. That means the top number is higher or the bottom number is higher, or both.   ?? ?? You think you may be having side effects from your blood pressure medicine.   Where can you learn more?  Go to [employer].  Enter [phone] in the search box to learn more about "High Blood Pressure: Care Instructions."  Current as of: March 04, 2017  Content Version: 11.9  ?? 2006-2018 [employer]. Care instructions adapted under license by [employer] (which disclaims liability or warranty for this information). If you have questions about a medical condition or this instruction, always ask your healthcare professional. [employer] disclaims any warranty or liability for your use of this information.         Epilepsy: Care Instructions  Your Care Instructions    Epilepsy is a common condition that causes repeated seizures. The seizures are caused by bursts of electrical activity in the brain that aren't normal. Seizures may cause problems with muscle control, movement, speech, vision, or awareness. They can be scary.  Epilepsy affects each person differently. Some people have only a few seizures. Others get them more often. If you know what triggers a seizure, you may be able to avoid having one.  You can take medicines to control and reduce seizures. You and your doctor will need to find the right combination, schedule, and dose of medicine. This may take time and careful changes.  Seizures may get worse and happen more often over time.  Follow-up care is a key part of your treatment and safety. Be sure to make and go to all appointments, and call your doctor if you are having problems. It's also a good idea to know your test results and keep a list of the medicines you take.   How can you care for yourself at home?  ?? Be safe with medicines. Take your medicines exactly as prescribed. Call your doctor if you think you are having a problem with your medicine.  ?? Make a treatment plan with your doctor. Be sure to follow your plan.  ?? Try to identify and avoid things that may make you more likely to have a seizure. These may include:  ? Not getting enough sleep.  ? Using drugs or alcohol.  ? Being emotionally stressed.  ? Skipping meals.  ?? Keep a record of any seizures you have. Note the date, time of day, and any details about the seizure that you can remember. Your doctor can use this information to plan or adjust your medicine or other treatment.  ??  Be sure that any doctor treating you for another condition knows that you have epilepsy. Each doctor should know what medicines you are taking, if any.  ?? Wear a medical ID bracelet. You can buy this at most drugstores. If you have a seizure that leaves you unconscious or unable to speak for yourself, this bracelet will let those who are treating you know that you have epilepsy.  ?? Talk to your doctor about whether it is safe for you to do certain activities, such as drive or swim.  When should you call for help?  Call 911 anytime you think you may need emergency care. For example, call if:  ?? ?? A seizure does not stop as it normally does.   ?? ?? You have new symptoms such as:  ? Numbness, tingling, or weakness on one side of your body or face.  ? Vision changes.  ? Trouble speaking or thinking clearly.   ??Call your doctor now or seek immediate medical care if:  ?? ?? You have a fever.   ?? ?? You have a severe headache.   ??Watch closely for changes in your health, and be sure to contact your doctor if:  ?? ?? The normal pattern or features of your seizures change.   Where can you learn more?  Go to [email or web address]www.healthwise.net[employer].  Enter [id number] in the search box to learn more about "Epilepsy: Care Instructions."   Current as of: January 14, 2017  Content Version: 11.9  ?? 2006-2018 [employer]. Care instructions adapted under license by [employer] (which disclaims liability or warranty for this information). If you have questions about a medical condition or this instruction, always ask your healthcare professional. [employer] disclaims any warranty or liability for your use of this information.

## 2018-01-23 NOTE — Progress Notes (Signed)
Chief Complaint   Patient presents with   ??? Results   ??? Hypertension     1. Have you been to the ER, urgent care clinic since your last visit?  Hospitalized since your last visit? Yes Seizures    2. Have you seen or consulted any other health care providers outside of the [name] since your last visit?  Include any pap smears or colon screening. No

## 2018-01-23 NOTE — Progress Notes (Signed)
TC to patient re request for refill of Vimpat, Gabapentin and Fenofibrate. Left message on voicemail at home #. Called daughter's # and left message as well. (Both VimPat and Gabapentin cannot be e-prescribed; needs to pick-up and schedule appt.).

## 2018-01-23 NOTE — Addendum Note (Signed)
Addended by: Reece LeaderSEABROOK, Tramar Brueckner A on: 02/20/2018 05:07 PM     Modules accepted: Orders

## 2018-01-23 NOTE — Progress Notes (Signed)
TC to patient re request for refill of Vimpat, Gabapentin and Fenofibrate. Left message on voicemail at home #. Called daughter's # and left message as well. (Both VimPat and Gabapentin cannot be e-prescribed; needs to pick-up and schedule appt.).
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
## 2018-01-23 NOTE — Progress Notes (Signed)
 Chief Complaint   Patient presents with   . Results   . Hypertension     1. Have you been to the ER, urgent care clinic since your last visit?  Hospitalized since your last visit? Yes Seizures    2. Have you seen or consulted any other health care providers outside of the [facility] since your last visit?  Include any pap smears or colon screening. No

## 2018-01-23 NOTE — Addendum Note (Signed)
Addendum  Note by Reece LeaderSeabrook, Traivon Morrical A at 01/23/18 0745                Author: Reece LeaderSeabrook, Adekunle Rohrbach A  Service: --  Author Type: Nurse Practitioner       Filed: 02/20/18 1707  Encounter Date: 01/23/2018  Status: Signed          Editor: Reece LeaderSeabrook, Amit Leece A          Addended by: Reece LeaderSEABROOK, Shawonda Kerce A on: 02/20/2018 05:07 PM    Modules accepted: Orders

## 2018-01-23 NOTE — Progress Notes (Signed)
 Chart audit for suspect conditions.

## 2018-01-28 MED ORDER — DONEPEZIL 10 MG TAB
10 mg | ORAL_TABLET | ORAL | 0 refills | Status: DC
Start: 2018-01-28 — End: 2018-02-14

## 2018-01-30 NOTE — Progress Notes (Signed)
Patient has graduated from the Transitions of Care Coordination  program on 6/19.  Patient's symptoms are stable at this time.  Patient/family has the ability to self-manage.   Doing fine, no seizures/falls/wondering, taking med's daily, happier lately d/t regular talking with her son. [name] has been in touch with pt's medicaid case worker who will be pushing pt's application thru since requested doc's are not applicable. Pt is expected to have a care giver in the mornings soon.  Care management goals have been completed at this time. No further nurse navigator follow up scheduled.    Goals Addressed                 This Visit's Progress    ??? COMPLETED: Identification of barriers to adherence to a plan of care such as inability to afford medications, lack of insurance, lack of transportation, etc.        While admitted pt was prescribed PRN Hydralazine for those times Systolic > 170 or Diastolic >110. Per [name], pt does not have a home bp cuff. This Clinical research associatewriter explains the importance of getting a cuff and how it relates to this new medication. This Clinical research associatewriter advises will ask PCP to prescribe a BP cuff at 5/28 toc. [name] understands it's importance and will have new script filled by 6/7. Next step will involve setting a reg schedule for monitoring pt's BP, creating a log and when to report to providers. Reassess by 6/7 (ms)    6/6: Pt's sister has attempted to obtain a BP cuff from the local pharmacy but informed the document she presented wasn't correct.This Clinical research associatewriter will ask PCP to had write a prescription or reorder to reflect on prescription paper. [name] will be contacted when new script is ready. Signs/symptoms of elevated bp are denied today. Reassess by 6/13 (ms).     6/19: Pt's sister has not picked up the new bp cuff, s/s of elevated bp denied, as she's not been back to the pharmacy. She has spoken with pcp who reports the correct prescription/paper was given the first time. This  Clinical research associatewriter suggest calling this office tomorrow when at the pharmacy if she has additional problems.             Pt has nurse navigator's contact information for any further questions, concerns, or needs.  Patients upcoming visits:    Future Appointments   Date Time Provider Department Center   01[facility]31/2018  8[facility]   02/22/2018 11:30 AM Gwynn BurlySeabrook[facility] Berna [facility]   03/13/2018  8:40 AM Aralu, Shelda Altesletus C, [facility]

## 2018-01-30 NOTE — Progress Notes (Signed)
 Patient has graduated from the Transitions of Care Coordination  program on 6/19.  Patient's symptoms are stable at this time.  Patient/family has the ability to self-manage.   Doing fine, no seizures/falls/wondering, taking med's daily, happier lately d/t regular talking with her son. Kenney has been in touch with pt's medicaid case worker who will be pushing pt's application thru since requested doc's are not applicable. Pt is expected to have a care giver in the mornings soon.  Care management goals have been completed at this time. No further nurse navigator follow up scheduled.    Goals Addressed                 This Visit's Progress    . COMPLETED: Identification of barriers to adherence to a plan of care such as inability to afford medications, lack of insurance, lack of transportation, etc.        While admitted pt was prescribed PRN Hydralazine  for those times Systolic > 170 or Diastolic >110. Per [name], pt does not have a home bp cuff. This Clinical research associate explains the importance of getting a cuff and how it relates to this new medication. This Clinical research associate advises will ask PCP to prescribe a BP cuff at 5/28 toc. [name] understands it's importance and will have new script filled by 6/7. Next step will involve setting a reg schedule for monitoring pt's BP, creating a log and when to report to providers. Reassess by 6/7 (ms)    6/6: Pt's sister has attempted to obtain a BP cuff from the local pharmacy but informed the document she presented wasn't correct.This Clinical research associate will ask PCP to had write a prescription or reorder to reflect on prescription paper. Kenney will be contacted when new script is ready. Signs/symptoms of elevated bp are denied today. Reassess by 6/13 (ms).     6/19: Pt's sister has not picked up the new bp cuff, s/s of elevated bp denied, as she's not been back to the pharmacy. She has spoken with pcp who reports the correct prescription/paper was given the first time. This Clinical research associate suggest  calling this office tomorrow when at the pharmacy if she has additional problems.             Pt has nurse navigator's contact information for any further questions, concerns, or needs.  Patients upcoming visits:    Future Appointments   Date Time Provider Department Center   01/31/2018  8:15 AM [facility]   02/22/2018 11:30 AM Dwyane Nadene LABOR, NP [facility] SCHED   03/13/2018  8:40 AM Aralu, Arlington BROCKS, MD [facility] SCHED

## 2018-01-31 ENCOUNTER — Inpatient Hospital Stay: Admit: 2018-01-31 | Payer: MEDICARE | Attending: Nurse Practitioner | Primary: Internal Medicine

## 2018-01-31 DIAGNOSIS — Z1231 Encounter for screening mammogram for malignant neoplasm of breast: Secondary | ICD-10-CM

## 2018-02-01 ENCOUNTER — Encounter

## 2018-02-03 MED ORDER — DIVALPROEX 500 MG TAB, DELAYED RELEASE
500 mg | ORAL_TABLET | ORAL | 0 refills | Status: DC
Start: 2018-02-03 — End: 2018-02-22

## 2018-02-04 MED ORDER — CITALOPRAM 20 MG TAB
20 mg | ORAL_TABLET | ORAL | 5 refills | Status: DC
Start: 2018-02-04 — End: 2018-02-22

## 2018-02-12 ENCOUNTER — Inpatient Hospital Stay: Payer: MEDICARE

## 2018-02-12 MED ORDER — SODIUM CHLORIDE 0.9 % IJ SYRG
INTRAMUSCULAR | Status: DC | PRN
Start: 2018-02-12 — End: 2018-02-12

## 2018-02-12 MED ORDER — SIMETHICONE 40 MG/0.6 ML ORAL DROPS, SUSP
40 mg/0.6 mL | ORAL | Status: DC | PRN
Start: 2018-02-12 — End: 2018-02-12

## 2018-02-12 MED ORDER — SODIUM CHLORIDE 0.9 % IJ SYRG
Freq: Three times a day (TID) | INTRAMUSCULAR | Status: DC
Start: 2018-02-12 — End: 2018-02-12

## 2018-02-12 MED ORDER — SODIUM CHLORIDE 0.9 % IV
INTRAVENOUS | Status: DC | PRN
Start: 2018-02-12 — End: 2018-02-12
  Administered 2018-02-12: 14:00:00 via INTRAVENOUS

## 2018-02-12 MED ORDER — NALOXONE 0.4 MG/ML INJECTION
0.4 mg/mL | INTRAMUSCULAR | Status: DC | PRN
Start: 2018-02-12 — End: 2018-02-12

## 2018-02-12 MED ORDER — FENTANYL CITRATE (PF) 50 MCG/ML IJ SOLN
50 mcg/mL | INTRAMUSCULAR | Status: DC | PRN
Start: 2018-02-12 — End: 2018-02-12

## 2018-02-12 MED ORDER — PROPOFOL 10 MG/ML IV EMUL
10 mg/mL | INTRAVENOUS | Status: DC | PRN
Start: 2018-02-12 — End: 2018-02-12
  Administered 2018-02-12 (×5): via INTRAVENOUS

## 2018-02-12 MED ORDER — EPINEPHRINE 0.1 MG/ML SYRINGE
0.1 mg/mL | Freq: Once | INTRAMUSCULAR | Status: DC | PRN
Start: 2018-02-12 — End: 2018-02-12

## 2018-02-12 MED ORDER — SODIUM CHLORIDE 0.9 % IV
INTRAVENOUS | Status: DC
Start: 2018-02-12 — End: 2018-02-12

## 2018-02-12 MED ORDER — MIDAZOLAM 1 MG/ML IJ SOLN
1 mg/mL | INTRAMUSCULAR | Status: DC | PRN
Start: 2018-02-12 — End: 2018-02-12

## 2018-02-12 MED ORDER — ATROPINE 0.1 MG/ML SYRINGE
0.1 mg/mL | Freq: Once | INTRAMUSCULAR | Status: DC | PRN
Start: 2018-02-12 — End: 2018-02-12

## 2018-02-12 MED ORDER — FLUMAZENIL 0.1 MG/ML IV SOLN
0.1 mg/mL | INTRAVENOUS | Status: DC | PRN
Start: 2018-02-12 — End: 2018-02-12

## 2018-02-12 MED FILL — SODIUM CHLORIDE 0.9 % IV: INTRAVENOUS | Qty: 1000

## 2018-02-12 MED FILL — DIPRIVAN 10 MG/ML INTRAVENOUS EMULSION: 10 mg/mL | INTRAVENOUS | Qty: 16

## 2018-02-12 NOTE — Procedures (Signed)
[place], [place]      Colonoscopy Operative Report  DANAMARIE MINAMI  [id number]  July 05, 1946      Procedure Type:   Colonoscopy with polypectomy (hot biopsy)     Indications:    Screening colonoscopy       Pre-operative Diagnosis: see indication above    Post-operative Diagnosis:  See findings below    Operator:  Corinna Lines, MD    Surgical Assistant: None    Implants:  None    Referring Provider: [name], NP      Sedation:  MAC anesthesia Propofol      Procedure Details:  After informed consent was obtained with all risks and benefits of procedure explained and preoperative exam completed, the patient was taken to the endoscopy suite and placed in the left lateral decubitus position.  Upon sequential sedation as per above, a digital rectal exam was performed demonstrating internal hemorrhoids.  The Olympus videocolonoscope  was inserted in the rectum and carefully advanced to the cecum, which was identified by the ileocecal valve and appendiceal orifice.  The cecum was identified by the ileocecal valve and appendiceal orifice.  The quality of preparation was excellent.  The colonoscope was slowly withdrawn with careful evaluation between folds. Retroflexion in the rectum was completed .     Findings:   Rectum: normal    9 mm polyp removed by hot biopsy  Sigmoid: normal  Descending Colon: normal  Transverse Colon: normal  Ascending Colon: normal  Cecum: normal        Specimen Removed:  as above    Complications: None.     EBL:  None.    Impression:    see findings    Recommendations: --Await pathology.      Recommendation for next colonscopy in 5 versus 10 years      Signed By: Corinna Lines, MD     02/12/2018  10:34 AM

## 2018-02-12 NOTE — Progress Notes (Signed)
Endoscope was pre-cleaned at the bedside immediately following procedure by Joe  lpn    Received report from Anesthesia-see anesthesia record for vital signs and medications administered during procedure.  Resumed care for vital signs and medications..
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
## 2018-02-12 NOTE — Anesthesia Pre-Procedure Evaluation (Signed)
Relevant Problems   No relevant active problems       Anesthetic History   No history of anesthetic complications            Review of Systems / Medical History  Patient summary reviewed, nursing notes reviewed and pertinent labs reviewed    Pulmonary  Within defined limits                 Neuro/Psych     seizures  CVA  TIA and psychiatric history    Comments: Hard of hearing Cardiovascular    Hypertension                   GI/Hepatic/Renal                Endo/Other    Diabetes         Other Findings              Physical Exam    Airway  Mallampati: I  TM Distance: > 6 cm  Neck ROM: normal range of motion   Mouth opening: Normal     Cardiovascular    Rhythm: regular  Rate: normal         Dental  No notable dental hx       Pulmonary  Breath sounds clear to auscultation               Abdominal         Other Findings            Anesthetic Plan    ASA: 2  Anesthesia type: MAC          Induction: Intravenous  Anesthetic plan and risks discussed with: Patient

## 2018-02-12 NOTE — Anesthesia Post-Procedure Evaluation (Signed)
Post-Anesthesia Evaluation and Assessment  Patient: Meghan [name] MRN: [id number]  SSN: [id number]    Date of Birth: 1945-10-13  Age: 72 y.o.  Sex: female      I have evaluated the patient and they are stable and ready for discharge from the PACU.     Cardiovascular Function/Vital Signs  Visit Vitals  BP 92/52   Pulse 75   Temp 36.4 ??C (97.6 ??F)   Resp 16   Ht 5' 5"  (1.651 m)   Wt 75.6 kg (166 lb 9 oz)   SpO2 96%   Breastfeeding? No   BMI 27.72 kg/m??       Patient is status post MAC anesthesia for Procedure(s):  COLONOSCOPY  ENDOSCOPIC POLYPECTOMY.    Nausea/Vomiting: None    Postoperative hydration reviewed and adequate.    Pain:  Pain Scale 1: Numeric (0 - 10) (02/12/18 1043)  Pain Intensity 1: 0 (02/12/18 1043)   Managed    Neurological Status:       At baseline    Mental Status, Level of Consciousness: Alert and  oriented to person, place, and time    Pulmonary Status:   O2 Device: Room air (02/12/18 1043)   Adequate oxygenation and airway patent    Complications related to anesthesia: None    Post-anesthesia assessment completed. No concerns    Signed By: Johnnette Litter, MD     February 12, 2018              Procedure(s):  COLONOSCOPY  ENDOSCOPIC POLYPECTOMY.    MAC    <BSHSIANPOST>    Vitals Value Taken Time   BP 149/65 02/12/2018 10:47 AM   Temp     Pulse 70 02/12/2018 10:49 AM   Resp 19 02/12/2018 10:49 AM   SpO2 97 % 02/12/2018 10:49 AM   Vitals shown include unvalidated device data.

## 2018-02-12 NOTE — Other (Signed)
Meghan FuchsBarbara J [name]  06-Sep-1945  [id number]    Situation:  Verbal report received from: C. Timberlake-Torseth, RN  Procedure: Procedure(s):  COLONOSCOPY  ENDOSCOPIC POLYPECTOMY    Background:    Preoperative diagnosis: SCREENING  Postoperative diagnosis: colon polyp    Operator:  Dr. Janetta HoraAbou-Assi  Assistant(s): Endoscopy Technician-1: Lyndel Safeurham, Joseph J IV  Endoscopy RN-1: Irena Cordsimberlake-Torseth, [place]    Specimens:   ID Type Source Tests Collected by Time Destination   1 : rectal polyp ( hot bx) Preservative Rectum  Abou-Assi, Souheil, MD 02/12/2018 1030 Pathology     H. Pylori  no    Assessment:  Intra-procedure medications   Anesthesia gave intra-procedure sedation and medications, see anesthesia flow sheet yes    Intravenous fluids: NS@ KVO     Vital signs stable     Abdominal assessment: round and soft     Recommendation:  Discharge patient per MD order.    Family or Friend   Permission to share finding with family or friend yes

## 2018-02-12 NOTE — Anesthesia Post-Procedure Evaluation (Signed)
Post-Anesthesia Evaluation and Assessment    Patient: Meghan [name] MRN: [id number]  SSN: [id number]    Date of Birth: 03/30/1946  Age: 72 y.o.  Sex: female      I have evaluated the patient and they are stable and ready for discharge from the PACU.     Cardiovascular Function/Vital Signs  Visit Vitals  BP 92/52   Pulse 75   Temp 36.4 ??C (97.6 ??F)   Resp 16   Ht _0  (1.651 m)   Wt 75.6 kg (166 lb 9 oz)   SpO2 96%   Breastfeeding? No   BMI 27.72 kg/m??       Patient is status post MAC anesthesia for Procedure(s):  COLONOSCOPY  ENDOSCOPIC POLYPECTOMY.    Nausea/Vomiting: None    Postoperative hydration reviewed and adequate.    Pain:  Pain Scale 1: Numeric (0 - 10) (02/12/18 1043)  Pain Intensity 1: 0 (02/12/18 1043)   Managed    Neurological Status:       At baseline    Mental Status, Level of Consciousness: Alert and  oriented to person, place, and time    Pulmonary Status:   O2 Device: Room air (02/12/18 1043)   Adequate oxygenation and airway patent    Complications related to anesthesia: None    Post-anesthesia assessment completed. No concerns    Signed By: Johnnette Litter, MD     February 12, 2018              Procedure(s):  COLONOSCOPY  ENDOSCOPIC POLYPECTOMY.    MAC    <BSHSIANPOST>    Vitals Value Taken Time   BP 149/65 02/12/2018 10:47 AM   Temp     Pulse 70 02/12/2018 10:49 AM   Resp 19 02/12/2018 10:49 AM   SpO2 97 % 02/12/2018 10:49 AM   Vitals shown include unvalidated device data.

## 2018-02-12 NOTE — Progress Notes (Signed)
Formatting of this note might be different from the original.  Endoscope was pre-cleaned at the bedside immediately following procedure by Joe  lpn    Received report from Anesthesia-see anesthesia record for vital signs and medications administered during procedure.  Resumed care for vital signs and medications..      Electronically signed by Irena Cordsimberlake-Torseth, [place] at 02/12/2018 10:19 AM EDT

## 2018-02-12 NOTE — Progress Notes (Signed)
Endoscope was pre-cleaned at the bedside immediately following procedure by Joe  lpn    Received report from Anesthesia-see anesthesia record for vital signs and medications administered during procedure.  Resumed care for vital signs and medications..Marland Kitchen

## 2018-02-12 NOTE — Unmapped (Signed)
Formatting of this note is different from the original.  Christiana FuchsBarbara J Pollino  09-08-1945  [id number]    Situation:  Verbal report received from: C. Timberlake-Torseth, RN  Procedure: Procedure(s):  COLONOSCOPY  ENDOSCOPIC POLYPECTOMY    Background:    Preoperative diagnosis: SCREENING  Postoperative diagnosis: colon polyp    Operator:  Dr. Janetta HoraAbou-Assi  Assistant(s): Endoscopy Technician-1: Lyndel Safeurham, Joseph J IV  Endoscopy RN-1: Irena Cordsimberlake-Torseth, [place]    Specimens:   ID Type Source Tests Collected by Time Destination   1 : rectal polyp ( hot bx) Preservative Rectum  Abou-Assi, Souheil, MD 02/12/2018 1030 Pathology     H. Pylori  no    Assessment:  Intra-procedure medications   Anesthesia gave intra-procedure sedation and medications, see anesthesia flow sheet yes    Intravenous fluids: NS@ KVO     Vital signs stable     Abdominal assessment: round and soft     Recommendation:  Discharge patient per MD order.    Family or Friend   Permission to share finding with family or friend yes  Electronically signed by Marciano Sequinunningham, Anna N, RN at 02/12/2018 10:48 AM EDT

## 2018-02-12 NOTE — Anesthesia Pre-Procedure Evaluation (Signed)
Formatting of this note is different from the original.  Relevant Problems   No relevant active problems     Anesthetic History   No history of anesthetic complications        Review of Systems / Medical History  Patient summary reviewed, nursing notes reviewed and pertinent labs reviewed    Pulmonary  Within defined limits     Neuro/Psych     seizures  CVA  TIA and psychiatric history    Comments: Hard of hearing Cardiovascular    Hypertension      GI/Hepatic/Renal       Endo/Other    Diabetes     Other Findings       Physical Exam    Airway  Mallampati: I  TM Distance: > 6 cm  Neck ROM: normal range of motion   Mouth opening: Normal     Cardiovascular    Rhythm: regular  Rate: normal     Dental  No notable dental hx      Pulmonary  Breath sounds clear to auscultation     Abdominal     Other Findings       Anesthetic Plan    ASA: 2  Anesthesia type: MAC    Induction: Intravenous  Anesthetic plan and risks discussed with: Patient        Electronically signed by Johnnette Litter, MD at 02/12/2018 10:04 AM EDT

## 2018-02-12 NOTE — Procedures (Signed)
Procedure(s): COLONOSCOPY,REMV LESN,FORCEP/CAUTERY  Pre-Procedure Diagnose(s): Screening for colon cancer  Post-Procedure Diagnose(s): Benign neoplasm of rectum  Formatting of this note is different from the original.  Images from the original note were not included.  [place]  [place], [place]      Colonoscopy Operative Report    Meghan [name]  [id number]  [birth year]    Procedure Type:   Colonoscopy with polypectomy (hot biopsy)     Indications:    Screening colonoscopy     Pre-operative Diagnosis: see indication above    Post-operative Diagnosis:  See findings below    Operator:  Corinna Lines, MD    Surgical Assistant: None    Implants:  None    Referring Provider: [name], NP    Sedation:  MAC anesthesia Propofol    Procedure Details:  After informed consent was obtained with all risks and benefits of procedure explained and preoperative exam completed, the patient was taken to the endoscopy suite and placed in the left lateral decubitus position.  Upon sequential sedation as per above, a digital rectal exam was performed demonstrating internal hemorrhoids.  The Olympus videocolonoscope  was inserted in the rectum and carefully advanced to the cecum, which was identified by the ileocecal valve and appendiceal orifice.  The cecum was identified by the ileocecal valve and appendiceal orifice.  The quality of preparation was excellent.  The colonoscope was slowly withdrawn with careful evaluation between folds. Retroflexion in the rectum was completed .     Findings:   Rectum: normal    9 mm polyp removed by hot biopsy  Sigmoid: normal  Descending Colon: normal  Transverse Colon: normal  Ascending Colon: normal  Cecum: normal    Specimen Removed:  as above    Complications: None.     EBL:  None.    Impression:    see findings    Recommendations: --Await pathology.      Recommendation for next colonscopy in 5 versus 10 years    Signed By: Corinna Lines, MD     02/12/2018  10:34 AM        Electronically signed by Corinna Lines, MD at 02/12/2018 10:37 AM EDT

## 2018-02-12 NOTE — Anesthesia Post-Procedure Evaluation (Signed)
Formatting of this note is different from the original.  Post-Anesthesia Evaluation and Assessment    Patient: Meghan [name] MRN: [id number]  SSN: [id number]    Date of Birth: 27-Mar-1946  Age: 72 y.o.  Sex: female      I have evaluated the patient and they are stable and ready for discharge from the PACU.    Cardiovascular Function/Vital Signs  Visit Vitals  BP 92/52   Pulse 75   Temp 36.4 C (97.6 F)   Resp 16   Ht 5' 5"  (1.651 m)   Wt 75.6 kg (166 lb 9 oz)   SpO2 96%   Breastfeeding? No   BMI 27.72 kg/m     Patient is status post MAC anesthesia for Procedure(s):  COLONOSCOPY  ENDOSCOPIC POLYPECTOMY.    Nausea/Vomiting: None    Postoperative hydration reviewed and adequate.    Pain:  Pain Scale 1: Numeric (0 - 10) (02/12/18 1043)  Pain Intensity 1: 0 (02/12/18 1043)   Managed    Neurological Status:     At baseline    Mental Status, Level of Consciousness: Alert and  oriented to person, place, and time    Pulmonary Status:   O2 Device: Room air (02/12/18 1043)   Adequate oxygenation and airway patent    Complications related to anesthesia: None    Post-anesthesia assessment completed. No concerns    Signed By: Johnnette Litter, MD     February 12, 2018          Procedure(s):  COLONOSCOPY  ENDOSCOPIC POLYPECTOMY.    MAC    <BSHSIANPOST>    Vitals Value Taken Time   BP 149/65 02/12/2018 10:47 AM   Temp     Pulse 70 02/12/2018 10:49 AM   Resp 19 02/12/2018 10:49 AM   SpO2 97 % 02/12/2018 10:49 AM   Vitals shown include unvalidated device data.  Electronically signed by Johnnette Litter, MD at 02/12/2018 10:51 AM EDT

## 2018-02-12 NOTE — Procedures (Signed)
Procedures  by Corinna Lines, MD at 02/12/18 1033                Author: Corinna Lines, MD  Service: Gastroenterology  Author Type: Physician       Filed: 02/12/18 1037  Date of Service: 02/12/18 1033  Status: Signed          Editor: Corinna Lines, MD (Physician)            Pre-procedure Diagnoses        1. Screening for colon cancer [Z12.11]                           Post-procedure Diagnoses        1. Benign neoplasm of rectum [D12.8]                           Procedures        1. COLONOSCOPY,REMV LESN,FORCEP/CAUTERY [[id number]]                              [place]   [place], [place]         Colonoscopy Operative Report      HELANE BRICENO   [id number]   Jul 01, 1946         Procedure Type:   Colonoscopy with polypectomy (hot biopsy)       Indications:    Screening colonoscopy          Pre-operative Diagnosis: see indication above      Post-operative Diagnosis:  See findings below      Operator:  Corinna Lines, MD      Surgical Assistant: None      Implants:  None      Referring Provider: [name], NP         Sedation:  MAC anesthesia Propofol         Procedure Details:  After informed consent was obtained with all risks and benefits of procedure explained and preoperative exam completed,  the patient was taken to the endoscopy suite and placed in the left lateral decubitus position.  Upon sequential sedation as per above, a digital rectal exam was performed demonstrating internal  hemorrhoids.  The Olympus videocolonoscope  was inserted in the rectum and carefully advanced to the cecum, which was identified by the ileocecal valve and appendiceal orifice .  The cecum was identified by the ileocecal valve and appendiceal orifice.  The quality of preparation was excellent.  The colonoscope was slowly withdrawn with careful evaluation between folds. Retroflexion in the rectum was completed .       Findings:    Rectum: normal      9 mm  polyp removed by hot biopsy   Sigmoid: normal   Descending Colon: normal   Transverse Colon: normal   Ascending Colon: normal   Cecum: normal            Specimen Removed:  as above      Complications: None.       EBL:  None.      Impression:    see findings      Recommendations: --Await pathology.        Recommendation for next colonscopy in 5 versus 10 years            Signed By:  Demarkis Gheen Abou-Assi, MD           02/12/2018  10:34 AM
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
## 2018-02-14 ENCOUNTER — Encounter

## 2018-02-15 MED ORDER — DONEPEZIL 10 MG TAB
10 mg | ORAL_TABLET | ORAL | 0 refills | Status: DC
Start: 2018-02-15 — End: 2018-03-25

## 2018-02-16 ENCOUNTER — Encounter

## 2018-02-20 ENCOUNTER — Encounter

## 2018-02-20 MED ORDER — GABAPENTIN 100 MG CAP
100 mg | ORAL_CAPSULE | ORAL | 1 refills | Status: DC
Start: 2018-02-20 — End: 2018-02-27

## 2018-02-20 MED ORDER — LACOSAMIDE 100 MG TAB
100 mg | ORAL_TABLET | ORAL | 1 refills | Status: DC
Start: 2018-02-20 — End: 2018-04-10

## 2018-02-20 MED ORDER — FENOFIBRATE 54 MG TAB
54 mg | ORAL_TABLET | Freq: Every evening | ORAL | 3 refills | Status: AC
Start: 2018-02-20 — End: ?

## 2018-02-20 MED ORDER — LISINOPRIL 10 MG TAB
10 mg | ORAL_TABLET | ORAL | 6 refills | Status: DC
Start: 2018-02-20 — End: 2018-02-27

## 2018-02-20 NOTE — Telephone Encounter (Signed)
-----   Message from Shenequa C Mitchell sent atJeanella Craze 02/19/2018  4:55 PM EDT -----  Regarding: DR Gweneth FritterSEABROOK / TELEPHONE  Numerous attempts via fax.....    [facility] is requesting new Prescriptions      "VMPAP" 100 MG  "GABAPENTIN 100 MG  "FENOFIBRATE" 54 MG       Best contact: [phone]

## 2018-02-22 ENCOUNTER — Ambulatory Visit: Admit: 2018-02-22 | Payer: PRIVATE HEALTH INSURANCE | Attending: Nurse Practitioner | Primary: Internal Medicine

## 2018-02-22 ENCOUNTER — Ambulatory Visit: Attending: Nurse Practitioner | Primary: Internal Medicine

## 2018-02-22 DIAGNOSIS — F0390 Unspecified dementia without behavioral disturbance: Secondary | ICD-10-CM

## 2018-02-22 MED ORDER — CITALOPRAM 20 MG TAB
20 mg | ORAL_TABLET | Freq: Every day | ORAL | 3 refills | Status: AC
Start: 2018-02-22 — End: ?

## 2018-02-22 MED ORDER — DIVALPROEX 500 MG TAB, DELAYED RELEASE
500 mg | ORAL_TABLET | ORAL | 0 refills | Status: DC
Start: 2018-02-22 — End: 2018-02-26

## 2018-02-22 NOTE — Addendum Note (Signed)
Addended by: Reece LeaderSEABROOK, Millissa Deese A on: 04/10/2018 03:12 PM     Modules accepted: Orders

## 2018-02-22 NOTE — Patient Instructions (Addendum)
Dementia: Care Instructions  Your Care Instructions    Dementia is a loss of mental skills that affects your daily life. It is different than the occasional trouble with memory that is part of aging. You may find it hard to remember things that you feel you should be able to remember. Or you may feel that your mind is just not working as well as usual.  Finding out that you have dementia is a shock. You may be afraid and worried about how the condition will change your life. Although there is no cure at this time, medicine may slow memory loss and improve thinking for a while. Other medicines may be able to help you sleep or cope with depression and behavior changes.  Dementia often gets worse slowly. But it can get worse quickly. As dementia gets worse, it may become harder to do common things that take planning, like making a list and going shopping. Over time, the disease may make it hard for you to take care of yourself. Some people with dementia need others to help care for them.  Dementia is different for everyone. You may be able to function well for a long time. In the early stage of the condition, you can do things at home to make life easier and safer. You also can keep doing your hobbies and other activities. Many people find comfort in planning now for their future needs.  Follow-up care is a key part of your treatment and safety. Be sure to make and go to all appointments, and call your doctor if you are having problems. It's also a good idea to know your test results and keep a list of the medicines you take.  How can you care for yourself at home?  ?? Take your medicines exactly as prescribed. Call your doctor if you think you are having a problem with your medicine.  ?? Eat healthy foods. Eat lots of whole grains, fruits, and vegetables every day. If you are not hungry, try snacks or nutritional drinks such as Boost, Ensure, or Sustacal.  ?? If you have problems sleeping:   ? Try not to nap too close to your bedtime.  ? Exercise regularly. Walking is a good choice.  ? Try a glass of warm milk or caffeine-free herbal tea before bed.  ?? Do tasks and activities during the time of day when you feel your best. It may help to develop a daily routine.  ?? Post labels, lists, and sticky notes to help you remember things. Write your activities on a calendar you can easily find. Put your clock where you can easily see it.  ?? Stay active. Take walks in familiar places, or with friends or loved ones. Try to stay active mentally too. Read and work crossword puzzles if you enjoy these activities.  ?? Do not drive unless you can pass an on-road driving test. If you are not sure if you are safe to drive, your state driver's license bureau can test you.  ?? Keep a cordless phone and a flashlight with new batteries by your bed. If possible, put a phone in each of the main rooms of your house, or carry a cell phone in case you fall and cannot reach a phone. Or, you can wear a device around your neck or wrist. You push a button that sends a signal for help.  Acknowledge your emotions and plan for the future  ?? Talk openly and honestly with your doctor.  ??   Let yourself grieve. It is common to feel angry, scared, frustrated, anxious, or depressed.  ?? Get emotional support from family, friends, a support group, or a counselor experienced in working with people who have dementia.  ?? Ask for help if you need it.  ?? Plan for the future.  ? Talk to your family and doctor about preparing a living will and other important papers while you can make decisions. These papers tell your doctors how to care for you at the end of your life.  ? Consider naming a person to make decisions about your care if you are not able to.  When should you call for help?  Call 911 anytime you think you may need emergency care. For example, call if:  ?? ?? You are lost and do not know whom to call.    ?? ?? You are injured and do not know whom to call.   ??Call your doctor now or seek immediate medical care if:  ?? ?? You are more confused or upset than usual.   ?? ?? You feel like you could hurt yourself because your mind is not working well.   ??Watch closely for changes in your health, and be sure to contact your doctor if you have any problems.  Where can you learn more?  Go to [employer].  Enter [phone] in the search box to learn more about "Dementia: Care Instructions."  Current as of: April 24, 2017  Content Version: 11.9  ?? 2006-2018 [employer]. Care instructions adapted under license by [employer] (which disclaims liability or warranty for this information). If you have questions about a medical condition or this instruction, always ask your healthcare professional. [employer] disclaims any warranty or liability for your use of this information.         Seizure: Care Instructions  Your Care Instructions    Seizures are caused by abnormal patterns of electrical signals in the brain. They are different for each person.  Seizures can affect movement, speech, vision, or awareness. Some people have only slight shaking of a hand and do not pass out. Other people may pass out and have violent shaking of the whole body. Some people appear to stare into space. They are awake, but they can't respond normally. Later, they may not remember what happened.  You may need tests to identify the type and cause of the seizures.  A seizure may occur only once, or you may have them more than one time. Taking medicines as directed and following up with your doctor may help keep you from having more seizures.  The doctor has checked you carefully, but problems can develop later. If you notice any problems or new symptoms, get medical treatment right away.  Follow-up care is a key part of your treatment and safety. Be sure to make  and go to all appointments, and call your doctor if you are having problems. It's also a good idea to know your test results and keep a list of the medicines you take.  How can you care for yourself at home?  ?? Be safe with medicines. Take your medicines exactly as prescribed. Call your doctor if you think you are having a problem with your medicine.  ?? Do not do any activity that could be dangerous to you or others until your doctor says it is safe to do so. For example, do not drive a car, operate machinery, swim, or climb ladders.  ?? Be sure that  anyone treating you for any health problem knows that you have had a seizure and what medicines you are taking for it.  ?? Identify and avoid things that may make you more likely to have a seizure. These may include lack of sleep, alcohol or drug use, stress, or not eating.  ?? Make sure you go to your follow-up appointment.  When should you call for help?  Call 911 anytime you think you may need emergency care. For example, call if:  ?? ?? You have another seizure.   ?? ?? You have more than one seizure in 24 hours.   ?? ?? You have new symptoms, such as trouble walking, speaking, or thinking clearly.   ??Call your doctor now or seek immediate medical care if:  ?? ?? You are not acting normally.   ??Watch closely for changes in your health, and be sure to contact your doctor if you have any problems.  Where can you learn more?  Go to [employer]cahttp://www.healthwise.net/[employer].  Enter [phone] [phone] in the search box to learn more about "Seizure: Care Instructions."  Current as of: January 14, 2017  Content Version: 11.9  ?? 2006-2018 [employer]. Care instructions adapted under license by [employer] (which disclaims liability or warranty for this information). If you have questions about a medical condition or this instruction, always ask your healthcare professional. [employer] disclaims any warranty or liability for your use of this information.
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
## 2018-02-22 NOTE — Addendum Note (Signed)
Addendum  Note by Reece LeaderSeabrook, Shelbylynn Walczyk A at 02/22/18 1130                Author: Reece LeaderSeabrook, Jamesha Ellsworth A  Service: --  Author Type: Nurse Practitioner       Filed: 04/10/18 1512  Encounter Date: 02/22/2018  Status: Signed          Editor: Reece LeaderSeabrook, Clifton Kovacic A          Addended by: Reece LeaderSEABROOK, Anne-Marie Genson A on: 04/10/2018 03:12 PM    Modules accepted: Orders

## 2018-02-23 ENCOUNTER — Inpatient Hospital Stay
Admit: 2018-02-23 | Discharge: 2018-02-24 | Disposition: A | Discharge status: Skilled Nursing Facility | Payer: MEDICARE | Attending: Emergency Medicine

## 2018-02-23 ENCOUNTER — Emergency Department: Admit: 2018-02-23 | Payer: MEDICARE | Primary: Internal Medicine

## 2018-02-23 DIAGNOSIS — G9341 Metabolic encephalopathy: Secondary | ICD-10-CM

## 2018-02-23 LAB — GLUCOSE, POC
Glucose (POC): 47 mg/dL — CL (ref 65–100)
Glucose (POC): 48 mg/dL — CL (ref 65–100)
Glucose (POC): 64 mg/dL — ABNORMAL LOW (ref 65–100)
Glucose (POC): 91 mg/dL (ref 65–100)

## 2018-02-23 LAB — CBC WITH AUTOMATED DIFF
ABS. BASOPHILS: 0 10*3/uL (ref 0.0–0.1)
ABS. EOSINOPHILS: 0.2 10*3/uL (ref 0.0–0.4)
ABS. IMM. GRANS.: 0 10*3/uL (ref 0.00–0.04)
ABS. LYMPHOCYTES: 2.3 10*3/uL (ref 0.8–3.5)
ABS. MONOCYTES: 0.5 10*3/uL (ref 0.0–1.0)
ABS. NEUTROPHILS: 1.7 10*3/uL — ABNORMAL LOW (ref 1.8–8.0)
ABSOLUTE NRBC: 0 10*3/uL (ref 0.00–0.01)
BASOPHILS: 1 % (ref 0–1)
EOSINOPHILS: 3 % (ref 0–7)
HCT: 42.4 % (ref 35.0–47.0)
HGB: 13.6 g/dL (ref 11.5–16.0)
IMMATURE GRANULOCYTES: 0 % (ref 0.0–0.5)
LYMPHOCYTES: 49 % (ref 12–49)
MCH: 30.9 PG (ref 26.0–34.0)
MCHC: 32.1 g/dL (ref 30.0–36.5)
MCV: 96.4 FL (ref 80.0–99.0)
MONOCYTES: 11 % (ref 5–13)
MPV: 10.4 FL (ref 8.9–12.9)
NEUTROPHILS: 36 % (ref 32–75)
NRBC: 0 PER 100 WBC
PLATELET: 472 10*3/uL — ABNORMAL HIGH (ref 150–400)
RBC: 4.4 M/uL (ref 3.80–5.20)
RDW: 13.7 % (ref 11.5–14.5)
WBC: 4.7 10*3/uL (ref 3.6–11.0)

## 2018-02-23 LAB — METABOLIC PANEL, COMPREHENSIVE
A-G Ratio: 0.9 — ABNORMAL LOW (ref 1.1–2.2)
ALT (SGPT): 21 U/L (ref 12–78)
AST (SGOT): 33 U/L (ref 15–37)
Albumin: 3.4 g/dL — ABNORMAL LOW (ref 3.5–5.0)
Alk. phosphatase: 44 U/L — ABNORMAL LOW (ref 45–117)
Anion gap: 7 mmol/L (ref 5–15)
BUN/Creatinine ratio: 18 (ref 12–20)
BUN: 21 MG/DL — ABNORMAL HIGH (ref 6–20)
Bilirubin, total: 0.3 MG/DL (ref 0.2–1.0)
CO2: 26 mmol/L (ref 21–32)
Calcium: 8.9 MG/DL (ref 8.5–10.1)
Chloride: 105 mmol/L (ref 97–108)
Creatinine: 1.14 MG/DL — ABNORMAL HIGH (ref 0.55–1.02)
GFR est AA: 57 mL/min/{1.73_m2} — ABNORMAL LOW (ref >60)
GFR est non-AA: 47 mL/min/{1.73_m2} — ABNORMAL LOW (ref >60)
Globulin: 3.6 g/dL (ref 2.0–4.0)
Glucose: 63 mg/dL — ABNORMAL LOW (ref 65–100)
Potassium: 4.1 mmol/L (ref 3.5–5.1)
Protein, total: 7 g/dL (ref 6.4–8.2)
Sodium: 138 mmol/L (ref 136–145)

## 2018-02-23 LAB — AMMONIA
Ammonia, plasma: 28 umol/L (ref <32)
Ammonia: 28 umol/L (ref <32)

## 2018-02-23 LAB — CK W/ REFLX CKMB
CK: 69 U/L (ref 26–192)
CK: 69 U/L (ref 26–192)

## 2018-02-23 LAB — VALPROIC ACID
Valproic Acid: 90 ug/ml (ref 50–100)
Valproic acid: 90 ug/ml (ref 50–100)

## 2018-02-23 LAB — TROPONIN I: Troponin-I, Qt.: <0.05 ng/mL (ref <0.05)

## 2018-02-23 LAB — COMPREHENSIVE METABOLIC PANEL
ALT: 21 U/L (ref 12–78)
AST: 33 U/L (ref 15–37)
Albumin/Globulin Ratio: 0.9 — ABNORMAL LOW (ref 1.1–2.2)
Albumin: 3.4 g/dL — ABNORMAL LOW (ref 3.5–5.0)
Alkaline Phosphatase: 44 U/L — ABNORMAL LOW (ref 45–117)
Anion Gap: 7 mmol/L (ref 5–15)
BUN: 21 MG/DL — ABNORMAL HIGH (ref 6–20)
Bun/Cre Ratio: 18 (ref 12–20)
CO2: 26 mmol/L (ref 21–32)
Calcium: 8.9 MG/DL (ref 8.5–10.1)
Chloride: 105 mmol/L (ref 97–108)
Creatinine: 1.14 MG/DL — ABNORMAL HIGH (ref 0.55–1.02)
EGFR IF NonAfrican American: 47 mL/min/{1.73_m2} — ABNORMAL LOW (ref >60)
GFR African American: 57 mL/min/{1.73_m2} — ABNORMAL LOW (ref >60)
Globulin: 3.6 g/dL (ref 2.0–4.0)
Glucose: 63 mg/dL — ABNORMAL LOW (ref 65–100)
Potassium: 4.1 mmol/L (ref 3.5–5.1)
Sodium: 138 mmol/L (ref 136–145)
Total Bilirubin: 0.3 MG/DL (ref 0.2–1.0)
Total Protein: 7 g/dL (ref 6.4–8.2)

## 2018-02-23 LAB — CBC WITH AUTO DIFFERENTIAL
Basophils %: 1 % (ref 0–1)
Basophils Absolute: 0 10*3/uL (ref 0.0–0.1)
Eosinophils %: 3 % (ref 0–7)
Eosinophils Absolute: 0.2 10*3/uL (ref 0.0–0.4)
Granulocyte Absolute Count: 0 10*3/uL (ref 0.00–0.04)
Hematocrit: 42.4 % (ref 35.0–47.0)
Hemoglobin: 13.6 g/dL (ref 11.5–16.0)
Immature Granulocytes: 0 % (ref 0.0–0.5)
Lymphocytes %: 49 % (ref 12–49)
Lymphocytes Absolute: 2.3 10*3/uL (ref 0.8–3.5)
MCH: 30.9 PG (ref 26.0–34.0)
MCHC: 32.1 g/dL (ref 30.0–36.5)
MCV: 96.4 FL (ref 80.0–99.0)
MPV: 10.4 FL (ref 8.9–12.9)
Monocytes %: 11 % (ref 5–13)
Monocytes Absolute: 0.5 10*3/uL (ref 0.0–1.0)
NRBC Absolute: 0 10*3/uL (ref 0.00–0.01)
Neutrophils %: 36 % (ref 32–75)
Neutrophils Absolute: 1.7 10*3/uL — ABNORMAL LOW (ref 1.8–8.0)
Nucleated RBCs: 0 PER 100 WBC
Platelets: 472 10*3/uL — ABNORMAL HIGH (ref 150–400)
RBC: 4.4 M/uL (ref 3.80–5.20)
RDW: 13.7 % (ref 11.5–14.5)
WBC: 4.7 10*3/uL (ref 3.6–11.0)

## 2018-02-23 LAB — TROPONIN: Troponin I: <0.05 ng/mL (ref <0.05)

## 2018-02-23 LAB — POCT GLUCOSE
POC Glucose: 47 mg/dL — CL (ref 65–100)
POC Glucose: 48 mg/dL — CL (ref 65–100)
POC Glucose: 64 mg/dL — ABNORMAL LOW (ref 65–100)
POC Glucose: 91 mg/dL (ref 65–100)

## 2018-02-23 MED ORDER — GLUCAGON 1 MG INJECTION
1 mg | INTRAMUSCULAR | Status: DC | PRN
Start: 2018-02-23 — End: 2018-02-27

## 2018-02-23 MED ORDER — DEXTROSE 10% IN WATER (D10W) IV
10 % | INTRAVENOUS | Status: DC | PRN
Start: 2018-02-23 — End: 2018-02-27
  Administered 2018-02-23: 23:00:00 via INTRAVENOUS

## 2018-02-23 MED ORDER — GLUCOSE 4 GRAM CHEWABLE TAB
4 gram | Freq: Once | ORAL | Status: AC
Start: 2018-02-23 — End: 2018-02-23
  Administered 2018-02-23: 23:00:00 via ORAL

## 2018-02-23 MED ORDER — SODIUM CHLORIDE 0.9% BOLUS IV
0.9 % | Freq: Once | INTRAVENOUS | Status: AC
Start: 2018-02-23 — End: 2018-02-23
  Administered 2018-02-24: via INTRAVENOUS

## 2018-02-23 MED FILL — SODIUM CHLORIDE 0.9 % IV: INTRAVENOUS | Qty: 1000

## 2018-02-23 MED FILL — DEXTROSE 10% IN WATER (D10W) IV: 10 % | INTRAVENOUS | Qty: 250

## 2018-02-23 MED FILL — DEX4 GLUCOSE 4 GRAM CHEWABLE TABLET: 4 gram | ORAL | Qty: 10

## 2018-02-23 NOTE — ED Notes (Signed)
TRANSFER - OUT REPORT:    Verbal report given to Chodou, RN (name) on Octa J Selleck  being transferred to [facility](unit) for routine progression of care       Report consisted of patient???s Situation, Background, Assessment and   Recommendations(SBAR).     Information from the following report(s) SBAR, Kardex, ED Summary, Intake/Output, Recent Results and Cardiac Rhythm NSR was reviewed with the receiving nurse.    Lines:   Peripheral IV 02/23/18 Right Forearm (Active)   Site Assessment Clean, dry, & intact 02/23/2018  5:20 PM   Phlebitis Assessment 0 02/23/2018  5:20 PM   Infiltration Assessment 0 02/23/2018  5:20 PM   Dressing Status Clean, dry, & intact 02/23/2018  5:20 PM   Dressing Type Transparent 02/23/2018  5:20 PM   Hub Color/Line Status Capped;Flushed;Patent 02/23/2018  5:20 PM   Action Taken Blood drawn 02/23/2018  5:20 PM        Opportunity for questions and clarification was provided.      Patient transported with:   Registered Nurse

## 2018-02-23 NOTE — ED Notes (Addendum)
ED staff remains unable to place an IV - Wong MD made aware - will continue to monitor - pt remains at baseline per family members - will continue to monitor;

## 2018-02-23 NOTE — ED Notes (Signed)
Hospitalist at bedside

## 2018-02-23 NOTE — ED Notes (Signed)
Still attempting to establish PIV at this time. Patient remains alert skin warm and dry to touch. Patient drinking apple juice at this time. Bedside shift change report given to RN Pablo  (oncoming nurse) by RN Joyce Heitman E (offgoing nurse). Report included the following information SBAR, ED Summary, MAR and Cardiac Rhythm NSR.

## 2018-02-23 NOTE — ED Notes (Signed)
FTSG 47 after eating and drinking. Attempted to given D 10 as ordered. PIV infiltrated. Attempting to obtain PIV access via ultrasound at this time. Patient remains alert skin warm and dry to touch.

## 2018-02-23 NOTE — ED Notes (Signed)
Two attempts made to obtain labs ordered with out success at this time

## 2018-02-23 NOTE — ED Notes (Signed)
Patient's sister [name] can be reached at [phone] or her daughter [name] [phone] can be reached to pick-up patient once ready.
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
## 2018-02-23 NOTE — ED Notes (Signed)
Patient given 8 ounces of apple juice for glucose of 63. Patient resting on stretcher eating crackers and talking with sister at bedside. 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 No hearing aids are with patient at this time. MD Wong made aware

## 2018-02-23 NOTE — ED Notes (Signed)
Pt remains unable to provide a urine sample at this time - will continue to monitor    Family members remains at bedside for comfort and care
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
## 2018-02-23 NOTE — ED Notes (Signed)
Two attempts made to obtain labs ordered with out success at this time

## 2018-02-23 NOTE — ED Notes (Signed)
Patient given 8 ounces of apple juice for glucose of 63. Patient resting on stretcher eating crackers and talking with sister at bedside. 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 No hearing aids are with patient at this time. MD Modesto CharonWong made aware

## 2018-02-23 NOTE — ED Notes (Signed)
FTSG 47 after eating and drinking. Attempted to given D 10 as ordered. PIV infiltrated. Attempting to obtain PIV access via ultrasound at this time. Patient remains alert skin warm and dry to touch.

## 2018-02-23 NOTE — ED Notes (Signed)
Patient's sister Veva Holes can be reached at [phone] or her daughter Suzette Battiest [phone] can be reached to pick-up patient once ready.

## 2018-02-23 NOTE — ED Notes (Signed)
Hospitalist at bedside 
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
## 2018-02-23 NOTE — ED Notes (Signed)
ED staff remains unable to place an IV Modesto Charon- Wong MD made aware - will continue to monitor - pt remains at baseline per family members - will continue to monitor;

## 2018-02-23 NOTE — ED Notes (Signed)
 TRANSFER - OUT REPORT:    Verbal report given to [place], RN (name) on Meghan [name]  being transferred to [facility](unit) for routine progression of care       Report consisted of patient's Situation, Background, Assessment and   Recommendations(SBAR).     Information from the following report(s) SBAR, Kardex, ED Summary, Intake/Output, Recent Results and Cardiac Rhythm NSR was reviewed with the receiving nurse.    Lines:   Peripheral IV 02/23/18 Right Forearm (Active)   Site Assessment Clean, dry, & intact 02/23/2018  5:20 PM   Phlebitis Assessment 0 02/23/2018  5:20 PM   Infiltration Assessment 0 02/23/2018  5:20 PM   Dressing Status Clean, dry, & intact 02/23/2018  5:20 PM   Dressing Type Transparent 02/23/2018  5:20 PM   Hub Color/Line Status Capped;Flushed;Patent 02/23/2018  5:20 PM   Action Taken Blood drawn 02/23/2018  5:20 PM        Opportunity for questions and clarification was provided.      Patient transported with:   Registered Nurse

## 2018-02-23 NOTE — ED Notes (Signed)
Still attempting to establish PIV at this time. Patient remains alert skin warm and dry to touch. Patient drinking apple juice at this time. Bedside shift change report given to RN Reginia Forts  (oncoming nurse) by Lanna Poche (offgoing nurse). Report included the following information SBAR, ED Summary, MAR and Cardiac Rhythm NSR.

## 2018-02-23 NOTE — ED Notes (Signed)
Pt remains unable to provide a urine sample at this time - will continue to monitor    Family members remains at bedside for comfort and care

## 2018-02-24 ENCOUNTER — Inpatient Hospital Stay: Admit: 2018-02-24 | Payer: MEDICARE | Primary: Internal Medicine

## 2018-02-24 LAB — ECHO ADULT COMPLETE
AV Area by Peak Velocity: 1.5 cm2
AV Peak Gradient: 6 mmHg
AV Peak Velocity: 122.06 cm/s
AVA/BSA Peak Velocity: 0.8 cm2/m2
E/E' Lateral: 9.71
E/E' Ratio (Averaged): 8.26
E/E' Septal: 6.81
Est. RA Pressure: 10 mmHg
IVSd: 0.85 cm (ref 0.6–0.9)
LA Area 2C: 12.11 cm2
LA Area 4C: 11.9 cm2
LA Major Axis: 3.13 cm
LA Volume 2C: 22.89 mL (ref 22–52)
LA Volume 4C: 18.29 mL — AB (ref 22–52)
LA Volume BP: 22.4 mL (ref 22–52)
LA Volume Index 2C: 12.56 ml/m2 (ref 16–28)
LA Volume Index 4C: 10.04 ml/m2 (ref 16–28)
LA Volume Index BP: 12.29 ml/m2 (ref 16–28)
LV E' Lateral Velocity: 4.16 cm/s
LV E' Septal Velocity: 5.93 cm/s
LV Mass 2D Index: 69.3 g/m2 (ref 43–95)
LV Mass 2D: 126.2 g (ref 67–162)
LVIDd: 4.45 cm (ref 3.9–5.3)
LVIDs: 3.13 cm
LVOT Diameter: 1.77 cm
LVOT Peak Gradient: 2.2 mmHg
LVOT Peak Velocity: 74.53 cm/s
LVPWd: 0.77 cm (ref 0.6–0.9)
MR Peak Gradient: 15.2 mmHg
MR Peak Velocity: 195.09 cm/s
MV A Velocity: 48.45 cm/s
MV E Velocity: 40.39 cm/s
MV E Wave Deceleration Time: 326 ms
MV E/A: 0.83
PASP: 25.6 mmHg
PV Max Velocity: 65.36 cm/s
PV Peak Gradient: 1.7 mmHg
RA Area 4C: 12.27 cm2
RVSP: 25.6 mmHg
TR Max Velocity: 197.73 cm/s
TR Peak Gradient: 15.6 mmHg

## 2018-02-24 LAB — GLUCOSE, POC
Glucose (POC): 119 mg/dL — ABNORMAL HIGH (ref 65–100)
Glucose (POC): 112 mg/dL — ABNORMAL HIGH (ref 65–100)
Glucose (POC): 77 mg/dL (ref 65–100)
Glucose (POC): 81 mg/dL (ref 65–100)

## 2018-02-24 LAB — URINALYSIS W/ REFLEX CULTURE
BACTERIA, URINE: NEGATIVE /hpf
Bacteria: NEGATIVE /hpf
Bilirubin, Urine: NEGATIVE
Bilirubin: NEGATIVE
Blood, Urine: NEGATIVE
Blood: NEGATIVE
Glucose, Ur: NEGATIVE mg/dL
Glucose: NEGATIVE mg/dL
Ketone: NEGATIVE mg/dL
Ketones, Urine: NEGATIVE mg/dL
Nitrite, Urine: NEGATIVE
Nitrites: NEGATIVE
Protein, UA: NEGATIVE mg/dL
Protein: NEGATIVE mg/dL
Specific Gravity, UA: 1.023 (ref 1.003–1.030)
Specific gravity: 1.023 (ref 1.003–1.030)
Urobilinogen, UA, POCT: 1 EU/dL (ref 0.2–1.0)
Urobilinogen: 1 EU/dL (ref 0.2–1.0)
pH (UA): 5.5 (ref 5.0–8.0)
pH, UA: 5.5 (ref 5.0–8.0)

## 2018-02-24 LAB — EKG, 12 LEAD, INITIAL
Atrial Rate: 69 {beats}/min
Calculated P Axis: 68 degrees
Calculated R Axis: 59 degrees
Calculated T Axis: 61 degrees
Diagnosis: NORMAL
P-R Interval: 148 ms
Q-T Interval: 392 ms
QRS Duration: 64 ms
QTC Calculation (Bezet): 420 ms
Ventricular Rate: 69 {beats}/min

## 2018-02-24 LAB — DRUG SCREEN, URINE
AMPHETAMINES: NEGATIVE
Amphetamine Screen, Urine: NEGATIVE
BARBITURATES: NEGATIVE
BENZODIAZEPINES: NEGATIVE
Barbiturate Screen, Urine: NEGATIVE
Benzodiazepine Screen, Urine: NEGATIVE
COCAINE: NEGATIVE
Cocaine Screen Urine: NEGATIVE
METHADONE: NEGATIVE
Methadone Screen, Urine: NEGATIVE
OPIATES: NEGATIVE
Opiate Screen, Urine: NEGATIVE
PCP Screen, Urine: NEGATIVE
PCP(PHENCYCLIDINE): NEGATIVE
THC (TH-CANNABINOL): NEGATIVE
THC Screen, Urine: NEGATIVE

## 2018-02-24 LAB — TRANSTHORACIC ECHOCARDIOGRAM (TTE) COMPLETE (CONTRAST/BUBBLE/3D PRN)
AV Area by Peak Velocity: 1.5 cm2
AV Peak Gradient: 6 mmHg
AV Peak Velocity: 122.06 cm/s
AVA/BSA Peak Velocity: 0.8 cm2/m2
E/E' Lateral: 9.71
E/E' Ratio (Averaged): 8.26
E/E' Septal: 6.81
Est. RA Pressure: 10 mmHg
IVSd: 0.85 cm (ref 0.6–0.9)
LA Area 2C: 12.11 cm2
LA Area 4C: 11.9 cm2
LA Major Axis: 3.13 cm
LA Volume 2C: 22.89 mL (ref 22–52)
LA Volume 4C: 18.29 mL — AB (ref 22–52)
LA Volume BP: 22.4 mL (ref 22–52)
LA Volume Index 2C: 12.56 ml/m2 (ref 16–28)
LA Volume Index 4C: 10.04 ml/m2 (ref 16–28)
LA Volume Index BP: 12.29 ml/m2 (ref 16–28)
LV E' Lateral Velocity: 4.16 cm/s
LV E' Septal Velocity: 5.93 cm/s
LV Mass 2D Index: 69.3 g/m2 (ref 43–95)
LV Mass 2D: 126.2 g (ref 67–162)
LVIDd: 4.45 cm (ref 3.9–5.3)
LVIDs: 3.13 cm
LVOT Diameter: 1.77 cm
LVOT Peak Gradient: 2.2 mmHg
LVOT Peak Velocity: 74.53 cm/s
LVPWd: 0.77 cm (ref 0.6–0.9)
Left Ventricular Ejection Fraction: 53
MR Peak Gradient: 15.2 mmHg
MR Peak Velocity: 195.09 cm/s
MV A Velocity: 48.45 cm/s
MV E Velocity: 40.39 cm/s
MV E Wave Deceleration Time: 326 ms
MV E/A: 0.83
PASP: 25.6 mmHg
PV Max Velocity: 65.36 cm/s
PV Peak Gradient: 1.7 mmHg
RA Area 4C: 12.27 cm2
RVSP: 25.6 mmHg
TR Max Velocity: 197.73 cm/s
TR Peak Gradient: 15.6 mmHg

## 2018-02-24 LAB — EKG 12-LEAD
Atrial Rate: 69 {beats}/min
Diagnosis: NORMAL
P Axis: 68 degrees
P-R Interval: 148 ms
Q-T Interval: 392 ms
QRS Duration: 64 ms
QTc Calculation (Bazett): 420 ms
R Axis: 59 degrees
T Axis: 61 degrees
Ventricular Rate: 69 {beats}/min

## 2018-02-24 LAB — POCT GLUCOSE
POC Glucose: 119 mg/dL — ABNORMAL HIGH (ref 65–100)
POC Glucose: 112 mg/dL — ABNORMAL HIGH (ref 65–100)
POC Glucose: 77 mg/dL (ref 65–100)
POC Glucose: 81 mg/dL (ref 65–100)

## 2018-02-24 MED ORDER — SODIUM CHLORIDE 0.9 % IJ SYRG
INTRAMUSCULAR | Status: DC | PRN
Start: 2018-02-24 — End: 2018-02-27

## 2018-02-24 MED ORDER — HYDRALAZINE 25 MG TAB
25 mg | Freq: Three times a day (TID) | ORAL | Status: DC | PRN
Start: 2018-02-24 — End: 2018-02-27

## 2018-02-24 MED ORDER — DEXTROSE 10% IN WATER (D10W) IV
10 % | INTRAVENOUS | Status: DC | PRN
Start: 2018-02-24 — End: 2018-02-27

## 2018-02-24 MED ORDER — SODIUM CHLORIDE 0.9 % IJ SYRG
Freq: Three times a day (TID) | INTRAMUSCULAR | Status: DC
Start: 2018-02-24 — End: 2018-02-27
  Administered 2018-02-24 – 2018-02-27 (×11): via INTRAVENOUS

## 2018-02-24 MED ORDER — ACETAMINOPHEN (TYLENOL) SOLUTION 32MG/ML
ORAL | Status: DC | PRN
Start: 2018-02-24 — End: 2018-02-27

## 2018-02-24 MED ORDER — DONEPEZIL 5 MG TAB
5 mg | Freq: Every evening | ORAL | Status: DC
Start: 2018-02-24 — End: 2018-02-27
  Administered 2018-02-24 – 2018-02-27 (×4): via ORAL

## 2018-02-24 MED ORDER — SODIUM CHLORIDE 0.9 % IV
INTRAVENOUS | Status: DC
Start: 2018-02-24 — End: 2018-02-24

## 2018-02-24 MED ORDER — ASPIRIN 81 MG CHEWABLE TAB
81 mg | Freq: Every day | ORAL | Status: DC
Start: 2018-02-24 — End: 2018-02-24

## 2018-02-24 MED ORDER — ACETAMINOPHEN 325 MG TABLET
325 mg | Freq: Four times a day (QID) | ORAL | Status: DC | PRN
Start: 2018-02-24 — End: 2018-02-27

## 2018-02-24 MED ORDER — FLUTICASONE 50 MCG/ACTUATION NASAL SPRAY, SUSP
50 mcg/actuation | Freq: Every day | NASAL | Status: DC
Start: 2018-02-24 — End: 2018-02-27
  Administered 2018-02-24 – 2018-02-27 (×3): via NASAL

## 2018-02-24 MED ORDER — BISACODYL 5 MG TAB, DELAYED RELEASE
5 mg | Freq: Every day | ORAL | Status: DC | PRN
Start: 2018-02-24 — End: 2018-02-27

## 2018-02-24 MED ORDER — SODIUM CHLORIDE 0.9 % IJ SYRG
Freq: Three times a day (TID) | INTRAMUSCULAR | Status: DC
Start: 2018-02-24 — End: 2018-02-27
  Administered 2018-02-24 – 2018-02-27 (×11): via INTRAVENOUS

## 2018-02-24 MED ORDER — TOPIRAMATE 25 MG TAB
25 mg | Freq: Two times a day (BID) | ORAL | Status: DC
Start: 2018-02-24 — End: 2018-02-27
  Administered 2018-02-24 – 2018-02-27 (×7): via ORAL

## 2018-02-24 MED ORDER — CITALOPRAM 20 MG TAB
20 mg | Freq: Every day | ORAL | Status: DC
Start: 2018-02-24 — End: 2018-02-27
  Administered 2018-02-24 – 2018-02-27 (×4): via ORAL

## 2018-02-24 MED ORDER — LACOSAMIDE 100 MG TAB
100 mg | Freq: Two times a day (BID) | ORAL | Status: DC
Start: 2018-02-24 — End: 2018-02-27
  Administered 2018-02-24 – 2018-02-27 (×8): via ORAL

## 2018-02-24 MED ORDER — CHOLECALCIFEROL (VITAMIN D3) 1,000 UNIT (25 MCG) TAB
Freq: Every day | ORAL | Status: DC
Start: 2018-02-24 — End: 2018-02-27
  Administered 2018-02-24 – 2018-02-27 (×4): via ORAL

## 2018-02-24 MED ORDER — ONDANSETRON (PF) 4 MG/2 ML INJECTION
4 mg/2 mL | Freq: Four times a day (QID) | INTRAMUSCULAR | Status: DC | PRN
Start: 2018-02-24 — End: 2018-02-27

## 2018-02-24 MED ORDER — DEXTROSE 5% IN NORMAL SALINE IV
INTRAVENOUS | Status: DC
Start: 2018-02-24 — End: 2018-02-26
  Administered 2018-02-24 – 2018-02-26 (×5): via INTRAVENOUS

## 2018-02-24 MED ORDER — ASPIRIN 81 MG CHEWABLE TAB
81 mg | Freq: Every day | ORAL | Status: DC
Start: 2018-02-24 — End: 2018-02-27
  Administered 2018-02-24 – 2018-02-27 (×4): via ORAL

## 2018-02-24 MED ORDER — ENOXAPARIN 40 MG/0.4 ML SUB-Q SYRINGE
40 mg/0.4 mL | SUBCUTANEOUS | Status: DC
Start: 2018-02-24 — End: 2018-02-27
  Administered 2018-02-24 – 2018-02-27 (×4): via SUBCUTANEOUS

## 2018-02-24 MED ORDER — ACETAMINOPHEN 325 MG TABLET
325 mg | ORAL | Status: DC | PRN
Start: 2018-02-24 — End: 2018-02-27

## 2018-02-24 MED ORDER — GABAPENTIN 100 MG CAP
100 mg | Freq: Three times a day (TID) | ORAL | Status: DC
Start: 2018-02-24 — End: 2018-02-27
  Administered 2018-02-24 – 2018-02-27 (×10): via ORAL

## 2018-02-24 MED ORDER — DIVALPROEX 250 MG TAB, DELAYED RELEASE
250 mg | Freq: Two times a day (BID) | ORAL | Status: DC
Start: 2018-02-24 — End: 2018-02-27
  Administered 2018-02-24 – 2018-02-27 (×8): via ORAL

## 2018-02-24 MED ORDER — SODIUM CHLORIDE 0.9 % IJ SYRG
INTRAMUSCULAR | Status: DC | PRN
Start: 2018-02-24 — End: 2018-02-27
  Administered 2018-02-25 – 2018-02-27 (×3): via INTRAVENOUS

## 2018-02-24 MED ORDER — FENOFIBRATE NANOCRYSTALLIZED 48 MG TAB
48 mg | Freq: Every evening | ORAL | Status: DC
Start: 2018-02-24 — End: 2018-02-27
  Administered 2018-02-24 – 2018-02-27 (×4): via ORAL

## 2018-02-24 MED ORDER — GLUCOSE 4 GRAM CHEWABLE TAB
4 gram | ORAL | Status: DC | PRN
Start: 2018-02-24 — End: 2018-02-27

## 2018-02-24 MED ORDER — ACETAMINOPHEN 650 MG RECTAL SUPPOSITORY
650 mg | RECTAL | Status: DC | PRN
Start: 2018-02-24 — End: 2018-02-27

## 2018-02-24 MED FILL — GABAPENTIN 100 MG CAP: 100 mg | ORAL | Qty: 1

## 2018-02-24 MED FILL — VIMPAT 100 MG TABLET: 100 mg | ORAL | Qty: 1

## 2018-02-24 MED FILL — DONEPEZIL 5 MG TAB: 5 mg | ORAL | Qty: 2

## 2018-02-24 MED FILL — BD POSIFLUSH NORMAL SALINE 0.9 % INJECTION SYRINGE: INTRAMUSCULAR | Qty: 40

## 2018-02-24 MED FILL — CHILDREN'S ASPIRIN 81 MG CHEWABLE TABLET: 81 mg | ORAL | Qty: 1

## 2018-02-24 MED FILL — ENOXAPARIN 40 MG/0.4 ML SUB-Q SYRINGE: 40 mg/0.4 mL | SUBCUTANEOUS | Qty: 0.4

## 2018-02-24 MED FILL — TOPIRAMATE 25 MG TAB: 25 mg | ORAL | Qty: 2

## 2018-02-24 MED FILL — DEPAKOTE 250 MG TABLET,DELAYED RELEASE: 250 mg | ORAL | Qty: 2

## 2018-02-24 MED FILL — ARICEPT 5 MG TABLET: 5 mg | ORAL | Qty: 2

## 2018-02-24 MED FILL — FENOFIBRATE NANOCRYSTALLIZED 48 MG TAB: 48 mg | ORAL | Qty: 1

## 2018-02-24 MED FILL — VITAMIN D3 25 MCG (1,000 UNIT) TABLET: 25 mcg (1,000 unit) | ORAL | Qty: 1

## 2018-02-24 MED FILL — FLUTICASONE 50 MCG/ACTUATION NASAL SPRAY, SUSP: 50 mcg/actuation | NASAL | Qty: 16

## 2018-02-24 MED FILL — DEXTROSE 5% IN NORMAL SALINE IV: INTRAVENOUS | Qty: 1000

## 2018-02-24 MED FILL — CITALOPRAM 20 MG TAB: 20 mg | ORAL | Qty: 1

## 2018-02-24 NOTE — Progress Notes (Signed)
-  Please complete MRI History and Safety Screening Form for this patient using KARDEX only under Orders Requiring a Screening Form:    Example:  Orders Requiring a Screening Form     Procedure Order Status Form Status    MRI EXAM Active In Progress     - Answer all questions completely, including Weight, Surgery, Allergy, and Implant History.   - Document MUST be "eSigned" using a "Signature Pad" by the person completing form, the patient, and the RN or MD completing form with the patient.  - Patient cannot be scanned until this form is completed and reviewed in MRI to ensure patient is SAFE and eligible for MRI.  - CALL MRI when this has been successfully completed at [phone].  - This must be done under KARDEX. Do not use the Nursing Flowsheets.

## 2018-02-24 NOTE — Progress Notes (Signed)
Called received from nursing as on-call SLP.  Note dysphagia screen completed and WNL and MRI negative for acute infarct.  Recommend discuss with MD resuming baseline diet given negative neuro workup and no deficits on nursing dysphagia screen.  Will follow up tomorrow should any further needs arise.  Thank you.      Amanda Smith, M.CD. CCC-SLP

## 2018-02-24 NOTE — Other (Signed)
Bedside and Verbal shift change report given to Molly Maduroobert (Cabin crewoncoming nurse) by Jones Apparel GroupChoudou Physiological scientist(offgoing nurse). Report included the following information SBAR, Kardex, Intake/Output and MAR.

## 2018-02-24 NOTE — Progress Notes (Signed)
Hospitalist Progress Note    NAME: Meghan [name]   DOB:  1945/12/03   MRN:  [id number]       Assessment / Plan:    Acute metabolic encephalopathy w/ aphasia, POA  R/o seizure versus CVA  R/o UTI  Hx of dementia, psychosis and sz do  ???Admit patient to telemetry unit  ???Continue seizure medication  ???Seizure precautions  -Patient was evaluated by neurology, who feels that her symptoms may be related to medications  -MRI was negative  -EEG pending  -Echocardiogram pending  -Last carotid ultrasound was neg in 8/18  -Continue aspirin  -Hemoglobin A1c and lipid panel pending, TSH was normal in May  -Urine looks slightly abnormal with small leukocyte esterase few epithelial cells 10-20 white blood cells but no bacteria.  She has a normal white count, no fever although is a poor historian does not seem to have any suprapubic tenderness.  Will hold off on treatment for possible urinary tract infection and follow-up culture which is pending and consider antibiotics if she spikes a fever or develops any symptoms  -PT/OT/ST eval  -We will ask care management to see for discharge planning for home health and medication management and therapy    Hypertension  ???Continue home antihypertensive medication    History of Dementia   ???Continue Aricept  ??  ??  Code Status: Full   Surrogate Decision Maker:  Nienaber,[name] Child   ??    DVT Prophylaxis: Lovenox   GI Prophylaxis: not indicated  ??  Baseline: independent     25.0 - 29.9 Overweight / Body mass index is 27.46 kg/m??.    Recommended Disposition: Home w/Family and HH PT, OT, RN 24 hr care     Subjective:     Chief Complaint / Reason for Physician Visit  Aphasia    Patient is confused but denies any pain or shortness of breath.  She keeps repeating some of the questions that I asked her.  Her family is no longer present but according to the nurse and neurology evaluation her mentation was back to baseline.  The patient cannot provide any history related to  why she is in the hospital.  She thinks that she is here because her "blood was low."    Patient was evaluated at 2:45 PM    Discussed with RN events overnight.     Review of Systems:  Symptom Y/N Comments  Symptom Y/N Comments   Fever/Chills    Chest Pain     Poor Appetite    Edema     Cough    Abdominal Pain     Sputum    Joint Pain     SOB/DOE    Pruritis/Rash     Nausea/vomit    Tolerating PT/OT     Diarrhea    Tolerating Diet     Constipation    Other       Could NOT obtain due to: confused     Objective:     VITALS:   Last 24hrs VS reviewed since prior progress note. Most recent are:  Patient Vitals for the past 24 hrs:   Temp Pulse Resp BP SpO2   02/24/18 1204 97.6 ??F (36.4 ??C) 64 18 129/66 99 %   02/24/18 0859 ??? ??? ??? 122/67 ???   02/24/18 0857 ??? 64 ??? 110/58 100 %   02/24/18 0822 ??? ??? ??? 106/59 ???   02/24/18 0736 97.3 ??F (36.3 ??C) 65 16 106/59 96 %  02/24/18 0401 97 ??F (36.1 ??C) 60 18 105/47 100 %   02/23/18 2357 97.4 ??F (36.3 ??C) 65 20 138/65 100 %   02/23/18 2015 ??? 64 20 ??? 100 %   02/23/18 2000 ??? 69 20 126/67 100 %   02/23/18 1935 97.7 ??F (36.5 ??C) 69 18 127/73 100 %   02/23/18 1930 ??? 66 15 ??? 100 %   02/23/18 1925 ??? 64 21 ??? 100 %   02/23/18 1920 ??? 65 21 ??? 100 %   02/23/18 1915 ??? 62 22 ??? 98 %   02/23/18 1910 ??? 60 15 ??? 99 %   02/23/18 1905 ??? 62 18 ??? 100 %   02/23/18 1900 ??? 63 22 ??? 100 %   02/23/18 1538 97.4 ??F (36.3 ??C) 74 18 112/69 99 %       Intake/Output Summary (Last 24 hours) at 02/24/2018 1529  Last data filed at 02/23/2018 2143  Gross per 24 hour   Intake 1000 ml   Output ???   Net 1000 ml        PHYSICAL EXAM:    Patient is awake and alert oriented to self and hospital she had trouble with the month but ultimately was able to say July but cannot tell me the year. She keeps repeating things I ask her w/o answering the question. She does not look toxic or in any distress she is on room air.  Have a slightly pale mucous membranes tacky cardiovascular regular rate no  obvious murmurs rubs or gallops.  Lungs are clear no wheezes rhonchi or crackles.  Abdomen bowel sounds present soft nontender no CVA tenderness no suprapubic tenderness.  Extremities no clubbing cyanosis or edema.  She follows commands and moves all extremities has no obvious facial abnormalities.  She does not appear to have a pronator drift she was able to lift both of her legs without difficulty.      Reviewed most current lab test results and cultures  YES  Reviewed most current radiology test results   YES  Review and summation of old records today    NO  Reviewed patient's current orders and MAR    YES  PMH/SH reviewed - no change compared to H&P  ________________________________________________________________________  Care Plan discussed with:    Comments   Patient y    Family      RN y    Buyer, retail  y neuro                     Multidiciplinary team rounds were held today with case manager, nursing, pharmacist and Higher education careers adviser.  Patient's plan of care was discussed; medications were reviewed and discharge planning was addressed.     ________________________________________________________________________  Total NON critical care TIME:  Minutes    Total CRITICAL CARE TIME Spent:   Minutes non procedure based      Comments   >50% of visit spent in counseling and coordination of care     ________________________________________________________________________  Christiana Pellant, MD     Procedures: see electronic medical records for all procedures/Xrays and details which were not copied into this note but were reviewed prior to creation of Plan.      LABS:  I reviewed today's most current labs and imaging studies.  Pertinent labs include:  Recent Labs     02/23/18  1642   WBC 4.7   HGB 13.6   HCT 42.4  PLT 472*     Recent Labs     02/23/18  1642   NA 138   K 4.1   CL 105   CO2 26   GLU 63*   BUN 21*   CREA 1.14*   CA 8.9   ALB 3.4*   TBILI 0.3   SGOT 33   ALT 21        Signed: Christiana PellantNoemi G Sharla Tankard, MD
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
## 2018-02-24 NOTE — Progress Notes (Signed)
Pt is very confused. Attempted to contact family member at  [phone] and daughter [name] [phone], no response. Unable to complete MRI screening sheet.  AM nurse notified.

## 2018-02-24 NOTE — Progress Notes (Signed)
Problem: Falls - Risk of  Goal: *Absence of Falls  Description  Document [name] Fall Risk and appropriate interventions in the flowsheet.  Outcome: Progressing Towards Goal     Problem: Patient Education: Go to Patient Education Activity  Goal: Patient/Family Education  Outcome: Progressing Towards Goal     Problem: Pressure Injury - Risk of  Goal: *Prevention of pressure injury  Description  Document Braden Scale and appropriate interventions in the flowsheet.  Outcome: Progressing Towards Goal     Problem: Patient Education: Go to Patient Education Activity  Goal: Patient/Family Education  Outcome: Progressing Towards Goal     Problem: Patient Education: Go to Patient Education Activity  Goal: Patient/Family Education  Outcome: Progressing Towards Goal     Problem: TIA/CVA Stroke: 0-24 hours  Goal: Off Pathway (Use only if patient is Off Pathway)  Outcome: Progressing Towards Goal  Goal: Activity/Safety  Outcome: Progressing Towards Goal     Problem: Ischemic Stroke: Discharge Outcomes  Goal: *Verbalizes anxiety and depression are reduced or absent  Outcome: Progressing Towards Goal  Goal: *Verbalize understanding of risk factor modification(Stroke Metric)  Outcome: Progressing Towards Goal  Goal: *Verbalizes importance of follow-up with primary care physician(Stroke Metric)  Outcome: Progressing Towards Goal  Goal: *Smoking cessation discussed,if applicable(Stroke Metric)  Outcome: Progressing Towards Goal
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
## 2018-02-24 NOTE — Progress Notes (Signed)
Pt is very confused. Attempted to contact family member at  [phone] and daughter Meghan [name] [phone], no response. Unable to complete MRI screening sheet.  AM nurse notified.

## 2018-02-24 NOTE — Progress Notes (Signed)
Called received from nursing as on-call SLP.  Note dysphagia screen completed and WNL and MRI negative for acute infarct.  Recommend discuss with MD resuming baseline diet given negative neuro workup and no deficits on nursing dysphagia screen.  Will follow up tomorrow should any further needs arise.  Thank you.      Benn MoulderAmanda Sanjuanita Condrey, M.CD. CCC-SLP

## 2018-02-24 NOTE — Progress Notes (Signed)
Progress Notes by Christiana Pellant, MD at 02/24/18 1528                Author: Christiana Pellant, MD  Service: Internal Medicine  Author Type: Physician       Filed: 02/24/18 1536  Date of Service: 02/24/18 1528  Status: Signed          Editor: Christiana Pellant, MD (Physician)                       Hospitalist Progress Note      NAME: Meghan [name]    DOB:  [birth year]    MRN:  [id number]          Assessment / Plan:        Acute metabolic encephalopathy w/ aphasia, POA   R/o seizure versus CVA   R/o UTI   Hx of dementia, psychosis and sz do  -Admit patient to telemetry unit  -Continue seizure medication  -Seizure precautions   -Patient was evaluated by neurology, who feels that her symptoms may be related to medications   -MRI was negative   -EEG pending   -Echocardiogram pending   -Last carotid ultrasound was neg in 8/18   -Continue aspirin   -Hemoglobin A1c and lipid panel pending, TSH was normal in May   -Urine looks slightly abnormal with small leukocyte esterase few epithelial cells 10-20 white blood cells but no bacteria.  She has a normal white count, no fever although is a poor historian does not seem to have any suprapubic tenderness.  Will hold  off on treatment for possible urinary tract infection and follow-up culture which is pending and consider antibiotics if she spikes a fever or develops any symptoms   -PT/OT/ST eval   -We will ask care management to see for discharge planning for home health and medication management and therapy     Hypertension  -Continue home antihypertensive medication     History of Dementia   -Continue Aricept   ??   ??   Code Status: Full    Surrogate Decision Maker:   Favorite,[name]  Child        ??      DVT Prophylaxis: Lovenox    GI Prophylaxis: not indicated   ??   Baseline: independent       25.0 - 29.9 Overweight  / Body mass index is 27.46 kg/m??.      Recommended Disposition : Home w/Family and HH PT, OT, RN 24 hr care          Subjective:        Chief Complaint /  Reason for Physician Visit   Aphasia      Patient is confused but denies any pain or shortness of breath.  She keeps repeating some of the questions that I asked her.  Her family is no longer present but  according to the nurse and neurology evaluation her mentation was back to baseline.  The patient cannot provide any history related to why she is in the hospital.  She thinks that she is here because her "blood was low."      Patient was evaluated at 2:45 PM      Discussed with RN events overnight.       Review of Systems:           Symptom  Y/N  Comments    Symptom  Y/N  Comments  Fever/Chills        Chest Pain                 Poor Appetite        Edema         Cough        Abdominal Pain                 Sputum        Joint Pain         SOB/DOE        Pruritis/Rash         Nausea/vomit        Tolerating PT/OT         Diarrhea        Tolerating Diet         Constipation        Other               Could NOT obtain due to:  confused          Objective:        VITALS:    Last 24hrs VS reviewed since prior progress note. Most recent are:   Patient Vitals for the past 24 hrs:            Temp  Pulse  Resp  BP  SpO2            02/24/18 1204  97.6 ??F (36.4 ??C)  64  18  129/66  99 %            02/24/18 0859  --  --  --  122/67  --     02/24/18 0857  --  64  --  110/58  100 %     02/24/18 0822  --  --  --  106/59  --     02/24/18 0736  97.3 ??F (36.3 ??C)  65  16  106/59  96 %     02/24/18 0401  97 ??F (36.1 ??C)  60  18  105/47  100 %     02/23/18 2357  97.4 ??F (36.3 ??C)  65  20  138/65  100 %     02/23/18 2015  --  64  20  --  100 %     02/23/18 2000  --  69  20  126/67  100 %     02/23/18 1935  97.7 ??F (36.5 ??C)  69  18  127/73  100 %     02/23/18 1930  --  66  15  --  100 %     02/23/18 1925  --  64  21  --  100 %     02/23/18 1920  --  65  21  --  100 %     02/23/18 1915  --  62  22  --  98 %     02/23/18 1910  --  60  15  --  99 %     02/23/18 1905  --  62  18  --  100 %     02/23/18 1900  --  63  22  --  100 %             02/23/18 1538  97.4 ??F (36.3 ??C)  74  18  112/69  99 %           Intake/Output Summary (Last 24 hours) at 02/24/2018 1529   Last data filed at  02/23/2018 2143     Gross per 24 hour        Intake  1000 ml        Output  --        Net  1000 ml            PHYSICAL EXAM:      Patient is awake and alert oriented to self and hospital she had trouble with the month but ultimately was able to say July but cannot tell me the year. She keeps repeating things I ask her w/o answering the question. She does not look toxic or in any  distress she is on room air.  Have a slightly pale mucous membranes tacky cardiovascular regular rate no obvious murmurs rubs or gallops.  Lungs are clear no wheezes rhonchi or crackles.  Abdomen bowel sounds present soft nontender no CVA tenderness no  suprapubic tenderness.  Extremities no clubbing cyanosis or edema.  She follows commands and moves all extremities has no obvious facial abnormalities.  She does not appear to have a pronator drift she was able to lift both of her legs without difficulty.         Reviewed most current lab test results and cultures  YES   Reviewed most current radiology test results   YES   Review and summation of old records today    NO   Reviewed patient's current orders and MAR    YES   PMH/SH reviewed - no change compared  to H&P   ________________________________________________________________________   Care Plan discussed with:           Comments         Patient  y           Family              RN  y       Research scientist (physical sciences)   y  neuro                         Multidiciplinary team rounds were held today with case manager, nursing, pharmacist and Higher education careers adviser.  Patient's plan of care was discussed;  medications were reviewed and discharge planning was addressed.       ________________________________________________________________________   Total NON critical care TIME:  Minutes      Total CRITICAL CARE TIME Spent:   Minutes non  procedure based              Comments         >50% of visit spent in counseling and coordination of care         ________________________________________________________________________   Christiana Pellant, MD       Procedures: see electronic medical records for all procedures/Xrays and details which were not copied into this note but were reviewed prior to creation of Plan.        LABS:   I reviewed today's most current labs and imaging studies.   Pertinent labs include:     Recent Labs           02/23/18   1642     WBC  4.7     HGB  13.6     HCT  42.4        PLT  472*          Recent Labs  02/23/18   1642     NA  138     K  4.1     CL  105     CO2  26     GLU  63*     BUN  21*     CREA  1.14*     CA  8.9     ALB  3.4*     TBILI  0.3     SGOT  33        ALT  21           Signed: Christiana Pellant, MD
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
## 2018-02-24 NOTE — Progress Notes (Signed)
-
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
## 2018-02-24 NOTE — Progress Notes (Signed)
Problem: Falls - Risk of  Goal: *Absence of Falls  Description  Document Bridgette Habermann Fall Risk and appropriate interventions in the flowsheet.  Outcome: Progressing Towards Goal     Problem: Patient Education: Go to Patient Education Activity  Goal: Patient/Family Education  Outcome: Progressing Towards Goal     Problem: Pressure Injury - Risk of  Goal: *Prevention of pressure injury  Description  Document Braden Scale and appropriate interventions in the flowsheet.  Outcome: Progressing Towards Goal     Problem: Patient Education: Go to Patient Education Activity  Goal: Patient/Family Education  Outcome: Progressing Towards Goal     Problem: Patient Education: Go to Patient Education Activity  Goal: Patient/Family Education  Outcome: Progressing Towards Goal     Problem: TIA/CVA Stroke: 0-24 hours  Goal: Off Pathway (Use only if patient is Off Pathway)  Outcome: Progressing Towards Goal  Goal: Activity/Safety  Outcome: Progressing Towards Goal     Problem: Ischemic Stroke: Discharge Outcomes  Goal: *Verbalizes anxiety and depression are reduced or absent  Outcome: Progressing Towards Goal  Goal: *Verbalize understanding of risk factor modification(Stroke Metric)  Outcome: Progressing Towards Goal  Goal: *Verbalizes importance of follow-up with primary care physician(Stroke Metric)  Outcome: Progressing Towards Goal  Goal: *Smoking cessation discussed,if applicable(Stroke Metric)  Outcome: Progressing Towards Goal

## 2018-02-25 LAB — LIPID PANEL
CHOL/HDL Ratio: 4.1 (ref 0.0–5.0)
Chol/HDL Ratio: 4.1 (ref 0.0–5.0)
Cholesterol, Total: 198 MG/DL (ref <200)
Cholesterol, total: 198 MG/DL (ref <200)
HDL Cholesterol: 48 MG/DL
HDL: 48 MG/DL
LDL Calculated: 132 MG/DL — ABNORMAL HIGH (ref 0–100)
LDL, calculated: 132 MG/DL — ABNORMAL HIGH (ref 0–100)
Triglyceride: 90 MG/DL (ref <150)
Triglycerides: 90 MG/DL (ref <150)
VLDL Cholesterol Calculated: 18 MG/DL
VLDL, calculated: 18 MG/DL

## 2018-02-25 LAB — METABOLIC PANEL, BASIC
Anion gap: 6 mmol/L (ref 5–15)
BUN/Creatinine ratio: 7 — ABNORMAL LOW (ref 12–20)
BUN: 7 MG/DL (ref 6–20)
CO2: 24 mmol/L (ref 21–32)
Calcium: 8.8 MG/DL (ref 8.5–10.1)
Chloride: 110 mmol/L — ABNORMAL HIGH (ref 97–108)
Creatinine: 0.94 MG/DL (ref 0.55–1.02)
GFR est AA: >60 mL/min/{1.73_m2} (ref >60)
GFR est non-AA: 59 mL/min/{1.73_m2} — ABNORMAL LOW (ref >60)
Glucose: 70 mg/dL (ref 65–100)
Potassium: 3.8 mmol/L (ref 3.5–5.1)
Sodium: 140 mmol/L (ref 136–145)

## 2018-02-25 LAB — GLUCOSE, POC
Glucose (POC): 87 mg/dL (ref 65–100)
Glucose (POC): 66 mg/dL (ref 65–100)
Glucose (POC): 67 mg/dL (ref 65–100)
Glucose (POC): 70 mg/dL (ref 65–100)

## 2018-02-25 LAB — HEMOGLOBIN A1C WITH EAG
Est. average glucose: 108 mg/dL
Hemoglobin A1c: 5.4 % (ref 4.2–6.3)

## 2018-02-25 LAB — CULTURE, URINE
Colonies Counted: 30000
Colony Count: 30000

## 2018-02-25 LAB — MAGNESIUM
Magnesium: 2 mg/dL (ref 1.6–2.4)
Magnesium: 2 mg/dL (ref 1.6–2.4)

## 2018-02-25 LAB — PHOSPHORUS
Phosphorus: 2.9 MG/DL (ref 2.6–4.7)
Phosphorus: 2.9 MG/DL (ref 2.6–4.7)

## 2018-02-25 LAB — POCT GLUCOSE
POC Glucose: 87 mg/dL (ref 65–100)
POC Glucose: 66 mg/dL (ref 65–100)
POC Glucose: 67 mg/dL (ref 65–100)
POC Glucose: 70 mg/dL (ref 65–100)

## 2018-02-25 LAB — BASIC METABOLIC PANEL
Anion Gap: 6 mmol/L (ref 5–15)
BUN: 7 MG/DL (ref 6–20)
Bun/Cre Ratio: 7 — ABNORMAL LOW (ref 12–20)
CO2: 24 mmol/L (ref 21–32)
Calcium: 8.8 MG/DL (ref 8.5–10.1)
Chloride: 110 mmol/L — ABNORMAL HIGH (ref 97–108)
Creatinine: 0.94 MG/DL (ref 0.55–1.02)
EGFR IF NonAfrican American: 59 mL/min/{1.73_m2} — ABNORMAL LOW (ref >60)
GFR African American: >60 mL/min/{1.73_m2} (ref >60)
Glucose: 70 mg/dL (ref 65–100)
Potassium: 3.8 mmol/L (ref 3.5–5.1)
Sodium: 140 mmol/L (ref 136–145)

## 2018-02-25 LAB — HEMOGLOBIN A1C W/EAG
Hemoglobin A1C: 5.4 % (ref 4.2–6.3)
eAG: 108 mg/dL

## 2018-02-25 MED FILL — BD POSIFLUSH NORMAL SALINE 0.9 % INJECTION SYRINGE: INTRAMUSCULAR | Qty: 10

## 2018-02-25 MED FILL — CHILDREN'S ASPIRIN 81 MG CHEWABLE TABLET: 81 mg | ORAL | Qty: 1

## 2018-02-25 MED FILL — VIMPAT 100 MG TABLET: 100 mg | ORAL | Qty: 1

## 2018-02-25 MED FILL — GABAPENTIN 100 MG CAP: 100 mg | ORAL | Qty: 1

## 2018-02-25 MED FILL — ENOXAPARIN 40 MG/0.4 ML SUB-Q SYRINGE: 40 mg/0.4 mL | SUBCUTANEOUS | Qty: 0.4

## 2018-02-25 MED FILL — ARICEPT 5 MG TABLET: 5 mg | ORAL | Qty: 2

## 2018-02-25 MED FILL — DEPAKOTE 250 MG TABLET,DELAYED RELEASE: 250 mg | ORAL | Qty: 2

## 2018-02-25 MED FILL — TOPIRAMATE 25 MG TAB: 25 mg | ORAL | Qty: 2

## 2018-02-25 MED FILL — CITALOPRAM 20 MG TAB: 20 mg | ORAL | Qty: 1

## 2018-02-25 MED FILL — VITAMIN D3 25 MCG (1,000 UNIT) TABLET: 25 mcg (1,000 unit) | ORAL | Qty: 1

## 2018-02-25 MED FILL — TRICOR 48 MG TABLET: 48 mg | ORAL | Qty: 1

## 2018-02-25 NOTE — Progress Notes (Signed)
Hospitalist Progress Note    NAME: Meghan FuchsBarbara J [name]   DOB:  [birth year]   MRN:  [id number]       Assessment / Plan:    Acute metabolic encephalopathy w/ aphasia, POA  R/o seizure versus CVA  R/o UTI  Hx of dementia, psychosis and sz do  ???Admit patient to telemetry unit  ???Continue seizure medication  ???Seizure precautions  -Patient was evaluated by neurology, who feels that her symptoms may be related to medications  -MRI was negative  -EEG c/w encephalopathy,  No sz  -Echocardiogram normal  -Last carotid ultrasound was neg in 8/18  -Continue aspirin  -Hemoglobin A1c and lipid panel pending, TSH was normal in May  -Urine looks slightly abnormal with small leukocyte esterase few epithelial cells 10-20 white blood cells but no bacteria.  She has a normal white count, no fever although is a poor historian does not seem to have any suprapubic tenderness.  Will hold off on treatment for possible urinary tract infection and follow-up culture shows 30K mixed flora, likely contaminated specimen, rather than UTI, consider antibiotics if she spikes a fever or develops any symptoms  -PT/OT/ST eval  -daughter does not feels pt is "quite baseline"  -neuro feels she is baseline, based on prior admissions  -care management to see for discharge planning for home health and medication management and therapy vs SNF, daughter wants SNF...apparently lives alone with some help from daughter and sister living nearby    Hypertension  ???Continue home antihypertensive medication    History of Dementia   ???Continue Aricept  ??  ??  Code Status: Full   Surrogate Decision Maker:  [name],Meghan Child   ??    DVT Prophylaxis: Lovenox   GI Prophylaxis: not indicated  ??  Baseline: independent     25.0 - 29.9 Overweight / Body mass index is 27.46 kg/m??.    Recommended Disposition: Home w/Family and HH PT, OT, RN 24 hr care vs SNF     Subjective:     Chief Complaint / Reason for Physician Visit  Aphasia     7/14 Patient is confused but denies any pain or shortness of breath.  She keeps repeating some of the questions that I asked her.  Her family is no longer present but according to the nurse and neurology evaluation her mentation was back to baseline.  The patient cannot provide any history related to why she is in the hospital.  She thinks that she is here because her "blood was low."    7/15 pt seems confused. Continues to repeat what ever I say and occ answers a previous question after long delay or after I asked another question.     Patient was evaluated at 3 PM    Discussed with RN events overnight.     Review of Systems:  Symptom Y/N Comments  Symptom Y/N Comments   Fever/Chills    Chest Pain     Poor Appetite    Edema     Cough    Abdominal Pain     Sputum    Joint Pain     SOB/DOE    Pruritis/Rash     Nausea/vomit    Tolerating PT/OT     Diarrhea    Tolerating Diet     Constipation    Other       Could NOT obtain due to: confused     Objective:     VITALS:   Last 24hrs VS reviewed since prior progress  note. Most recent are:  Patient Vitals for the past 24 hrs:   Temp Pulse Resp BP SpO2   02/25/18 1541 97.8 ??F (36.6 ??C) 62 18 120/71 100 %   02/25/18 1135 98.1 ??F (36.7 ??C) 60 18 120/69 97 %   02/25/18 0752 97.9 ??F (36.6 ??C) 64 18 107/53 100 %   02/25/18 0342 97.9 ??F (36.6 ??C) 62 18 108/71 100 %   02/25/18 0003 98.1 ??F (36.7 ??C) 66 18 95/52 97 %   02/24/18 2023 98.5 ??F (36.9 ??C) 66 16 101/51 99 %       Intake/Output Summary (Last 24 hours) at 02/25/2018 1717  Last data filed at 02/25/2018 0043  Gross per 24 hour   Intake 24181.25 ml   Output ???   Net 24181.25 ml        PHYSICAL EXAM:    Patient is awake and alert oriented to self and hospital.  She keeps repeating things. She does not look toxic or in any distress she is on room air.  Have a slightly pale mucous membranes tacky cardiovascular regular rate no obvious murmurs rubs or gallops.  Lungs are clear no  wheezes rhonchi or crackles.  Abdomen bowel sounds present soft nontender no CVA tenderness no suprapubic tenderness.  Extremities no clubbing cyanosis or edema.  She follows commands and moves all extremities has no obvious facial abnormalities.  She does not appear to have a pronator drift she was able to lift both of her legs without difficulty.      Reviewed most current lab test results and cultures  YES  Reviewed most current radiology test results   YES  Review and summation of old records today    NO  Reviewed patient's current orders and MAR    YES  PMH/SH reviewed - no change compared to H&P  ________________________________________________________________________  Care Plan discussed with:    Comments   Patient y    Family      RN y    Futures trader y    Consultant  y neuro                     Multidiciplinary team rounds were held today with case manager, nursing, pharmacist and Higher education careers adviser.  Patient's plan of care was discussed; medications were reviewed and discharge planning was addressed.     ________________________________________________________________________  Total NON critical care TIME:  Minutes    Total CRITICAL CARE TIME Spent:   Minutes non procedure based      Comments   >50% of visit spent in counseling and coordination of care     ________________________________________________________________________  Meghan Pellant, MD     Procedures: see electronic medical records for all procedures/Xrays and details which were not copied into this note but were reviewed prior to creation of Plan.      LABS:  I reviewed today's most current labs and imaging studies.  Pertinent labs include:  Recent Labs     02/23/18  1642   WBC 4.7   HGB 13.6   HCT 42.4   PLT 472*     Recent Labs     02/25/18  0349 02/23/18  1642   NA 140 138   K 3.8 4.1   CL 110* 105   CO2 24 26   GLU 70 63*   BUN 7 21*   CREA 0.94 1.14*   CA 8.8 8.9   MG 2.0  --    PHOS  2.9  --    ALB  --  3.4*   TBILI  --  0.3    SGOT  --  33   ALT  --  21       Signed: Christiana Pellant, MD

## 2018-02-25 NOTE — Progress Notes (Signed)
Neurology Progress Note    Patient ID:  Meghan FuchsBarbara J [name]  [id number]  72 y.o.  18-Jun-1946    CC: altered mental status    Subjective:      Patient has had no recurrence of AMS. Baseline dementia.  Brain MRI without contrast revealed no acute findings.    Current Facility-Administered Medications   Medication Dose Route Frequency   ??? dextrose 10% infusion 125-250 mL  125-250 mL IntraVENous PRN   ??? glucagon (GLUCAGEN) injection 1 mg  1 mg IntraMUSCular PRN   ??? divalproex DR (DEPAKOTE) tablet 500 mg  500 mg Oral Q12H   ??? gabapentin (NEURONTIN) capsule 100 mg  100 mg Oral TID   ??? lacosamide (VIMPAT) tablet 100 mg  100 mg Oral Q12H   ??? topiramate (TOPAMAX) tablet 50 mg  50 mg Oral BID   ??? hydrALAZINE (APRESOLINE) tablet 25 mg  25 mg Oral TID PRN   ??? donepezil (ARICEPT) tablet 10 mg  10 mg Oral QHS   ??? fenofibrate nanocrystallized (TRICOR) tablet 48 mg  48 mg Oral QHS   ??? fluticasone propionate (FLONASE) 50 mcg/actuation nasal spray 1 Spray  1 Spray Both Nostrils DAILY   ??? citalopram (CELEXA) tablet 20 mg  20 mg Oral DAILY   ??? cholecalciferol (VITAMIN D3) tablet 1,000 Units  1,000 Units Oral DAILY   ??? sodium chloride (NS) flush 5-40 mL  5-40 mL IntraVENous Q8H   ??? sodium chloride (NS) flush 5-40 mL  5-40 mL IntraVENous PRN   ??? acetaminophen (TYLENOL) tablet 650 mg  650 mg Oral Q6H PRN   ??? ondansetron (ZOFRAN) injection 4 mg  4 mg IntraVENous Q6H PRN   ??? bisacodyl (DULCOLAX) tablet 5 mg  5 mg Oral DAILY PRN   ??? enoxaparin (LOVENOX) injection 40 mg  40 mg SubCUTAneous Q24H   ??? sodium chloride (NS) flush 5-40 mL  5-40 mL IntraVENous Q8H   ??? sodium chloride (NS) flush 5-40 mL  5-40 mL IntraVENous PRN   ??? acetaminophen (TYLENOL) tablet 650 mg  650 mg Oral Q4H PRN    Or   ??? acetaminophen (TYLENOL) solution 650 mg  650 mg Per NG tube Q4H PRN    Or   ??? acetaminophen (TYLENOL) suppository 650 mg  650 mg Rectal Q4H PRN   ??? aspirin chewable tablet 81 mg  81 mg Oral DAILY   ??? glucose chewable tablet 16 g  4 Tab Oral PRN    ??? dextrose 10% infusion 125-250 mL  125-250 mL IntraVENous PRN   ??? dextrose 5% and 0.9% NaCl infusion  75 mL/hr IntraVENous CONTINUOUS        Review of Systems:    Pertinent items are noted in HPI.    Objective:     Patient Vitals for the past 8 hrs:   BP Temp Pulse Resp SpO2   02/25/18 1135 120/69 98.1 ??F (36.7 ??C) 60 18 97 %   02/25/18 0752 107/53 97.9 ??F (36.6 ??C) 64 18 100 %       No intake/output data recorded.  07/13 1901 - 07/15 0700  In: 25181.3 [I.V.:25181.3]  Out: -     Lab Review   Recent Results (from the past 24 hour(s))   GLUCOSE, POC    Collection Time: 02/24/18  9:55 PM   Result Value Ref Range    Glucose (POC) 87 65 - 100 mg/dL    Performed by Luther RedoMonica King (PCT)    METABOLIC PANEL, BASIC    Collection Time:  02/25/18  3:49 AM   Result Value Ref Range    Sodium 140 136 - 145 mmol/L    Potassium 3.8 3.5 - 5.1 mmol/L    Chloride 110 (H) 97 - 108 mmol/L    CO2 24 21 - 32 mmol/L    Anion gap 6 5 - 15 mmol/L    Glucose 70 65 - 100 mg/dL    BUN 7 6 - 20 MG/DL    Creatinine 1.61 0.96 - 1.02 MG/DL    BUN/Creatinine ratio 7 (L) 12 - 20      GFR est AA >60 >60 ml/min/1.83m2    GFR est non-AA 59 (L) >60 ml/min/1.84m2    Calcium 8.8 8.5 - 10.1 MG/DL   MAGNESIUM    Collection Time: 02/25/18  3:49 AM   Result Value Ref Range    Magnesium 2.0 1.6 - 2.4 mg/dL   PHOSPHORUS    Collection Time: 02/25/18  3:49 AM   Result Value Ref Range    Phosphorus 2.9 2.6 - 4.7 MG/DL   HEMOGLOBIN E4V WITH EAG    Collection Time: 02/25/18  3:49 AM   Result Value Ref Range    Hemoglobin A1c 5.4 4.2 - 6.3 %    Est. average glucose 108 mg/dL   LIPID PANEL    Collection Time: 02/25/18  3:49 AM   Result Value Ref Range    LIPID PROFILE          Cholesterol, total 198 <200 MG/DL    Triglyceride 90 <409 MG/DL    HDL Cholesterol 48 MG/DL    LDL, calculated 811 (H) 0 - 100 MG/DL    VLDL, calculated 18 MG/DL    CHOL/HDL Ratio 4.1 0.0 - 5.0     GLUCOSE, POC    Collection Time: 02/25/18  6:43 AM   Result Value Ref Range     Glucose (POC) 66 65 - 100 mg/dL    Performed by Luther Redo (PCT)    GLUCOSE, POC    Collection Time: 02/25/18  6:56 AM   Result Value Ref Range    Glucose (POC) 67 65 - 100 mg/dL    Performed by Luther Redo (PCT)    GLUCOSE, POC    Collection Time: 02/25/18  7:12 AM   Result Value Ref Range    Glucose (POC) 70 65 - 100 mg/dL    Performed by Luther Redo (PCT)      NEUROLOGICAL EXAM:  ??  Appearance:  The patient is well developed, well nourished, unable to provide a coherent history and is in no acute distress.   Mental Status: Oriented to place and person.  Fluent, no evidence of dysarthria or aphasia.  Good naming and repetition.  Follows simple commands. Poor memory. Mood and affect appropriate.   Cranial Nerves:   II - XII were intact.     Motor:  5/5 strength. Normal bulk and tone. No pronator drift.   Reflexes:   Deep tendon reflexes were symmetrical.  Downgoing toes.   Sensory:   Normal to cold and pinprick.   Gait:  Not tested.   Tremor:   No tremor noted.   Cerebellar:  No cerebellar signs present.       Assessment:   Encephalopathy  Seizure  Dementia    Plan:   Neurological examination was nonfocal.    ??  Head CT without contrast did not reveal any acute process.  Brain MRI without contrast was unremarkable.  No acute stroke.  ??  No  evidence that issues due to breakthrough seizures.  EEG revealed no seizures. Mild to moderate generalized slowing as seen in encephalopathies or TBI or significant dementia. Continue current seizure medications.  ??  Patient with baseline dementia.  Continue Aricept.  ??  Recommend evaluation by case manager in relation to compliance at home regarding her medications.  May need to home health visit to assess compliance.  Medications positive prepared by her daughter.  Need to assure that recurrent issues with alteration of mentation is not due to medication noncompliance.  ??  No need to change patient's current medication.  Okay for discharge from a  neurological standpoint. Follow up with outpatient neurology, Dr. Lennart Pall.    Signed:  Tobi Bastos., MD  02/25/2018  2:20 PM

## 2018-02-25 NOTE — Procedures (Signed)
[facility]   [address]   [place], [place] [address]      Electroencephalogram    Procedure ID: [id number] Procedure Date: 02/25/2018   Patient Name: Meghan [name] Date of Birth: 1946-08-04   Procedure Type: Routine Medical Record No: [id number]     INDICATION: Altered mental status    Medications:  Current Facility-Administered Medications   Medication Dose Route Frequency   ??? dextrose 10% infusion 125-250 mL  125-250 mL IntraVENous PRN   ??? glucagon (GLUCAGEN) injection 1 mg  1 mg IntraMUSCular PRN   ??? divalproex DR (DEPAKOTE) tablet 500 mg  500 mg Oral Q12H   ??? gabapentin (NEURONTIN) capsule 100 mg  100 mg Oral TID   ??? lacosamide (VIMPAT) tablet 100 mg  100 mg Oral Q12H   ??? topiramate (TOPAMAX) tablet 50 mg  50 mg Oral BID   ??? hydrALAZINE (APRESOLINE) tablet 25 mg  25 mg Oral TID PRN   ??? donepezil (ARICEPT) tablet 10 mg  10 mg Oral QHS   ??? fenofibrate nanocrystallized (TRICOR) tablet 48 mg  48 mg Oral QHS   ??? fluticasone propionate (FLONASE) 50 mcg/actuation nasal spray 1 Spray  1 Spray Both Nostrils DAILY   ??? citalopram (CELEXA) tablet 20 mg  20 mg Oral DAILY   ??? cholecalciferol (VITAMIN D3) tablet 1,000 Units  1,000 Units Oral DAILY   ??? sodium chloride (NS) flush 5-40 mL  5-40 mL IntraVENous Q8H   ??? sodium chloride (NS) flush 5-40 mL  5-40 mL IntraVENous PRN   ??? acetaminophen (TYLENOL) tablet 650 mg  650 mg Oral Q6H PRN   ??? ondansetron (ZOFRAN) injection 4 mg  4 mg IntraVENous Q6H PRN   ??? bisacodyl (DULCOLAX) tablet 5 mg  5 mg Oral DAILY PRN   ??? enoxaparin (LOVENOX) injection 40 mg  40 mg SubCUTAneous Q24H   ??? sodium chloride (NS) flush 5-40 mL  5-40 mL IntraVENous Q8H   ??? sodium chloride (NS) flush 5-40 mL  5-40 mL IntraVENous PRN   ??? acetaminophen (TYLENOL) tablet 650 mg  650 mg Oral Q4H PRN    Or   ??? acetaminophen (TYLENOL) solution 650 mg  650 mg Per NG tube Q4H PRN    Or   ??? acetaminophen (TYLENOL) suppository 650 mg  650 mg Rectal Q4H PRN    ??? aspirin chewable tablet 81 mg  81 mg Oral DAILY   ??? glucose chewable tablet 16 g  4 Tab Oral PRN   ??? dextrose 10% infusion 125-250 mL  125-250 mL IntraVENous PRN   ??? dextrose 5% and 0.9% NaCl infusion  75 mL/hr IntraVENous CONTINUOUS       DESCRIPTION OF PROCEDURE: Electrodes were applied in accordance with the international 10-20 system of electrode placement.  EEG was reviewed in both bipolar and referential montages    Description of Activity:  During wakefulness, patient is only able to mount 6 to 7 hertz frequency that spreads anteriorly. Background slowing theta frequency is seen.    During drowsiness, there is attenuation of the underlying frequency and low voltage theta activity occurs bilaterally. Early stage of sleep are seen and symmetric.    Intermittent photic stimulation was performed and did not induce posterior driving responses.     No sharp or spike discharges, seizures or epileptiform discharges seen. No focal asymmetry.    Clinical Interpretation:  This EEG, performed during wakefulness and sleep is abnormal. There is mild to moderate generalized slowing. There is  no focal asymmetry, seizures or epileptiform discharges seen.

## 2018-02-25 NOTE — Other (Signed)
Letter of Determination:  Outpatient status receiving Observation Services    This patient was originally hospitalized as Inpatient Status on 02/23/2018 for acute encepalopathy.  At this time this patient does not appear to meet the medical necessity requirements to support an inpatient level of care.    It is our recommendation that this patient's hospitalization status should be changed from INPATIENT to OUTPATIENT STATUS receiving OBSERVATION services via Condition Code 44.   ??  This may change due to the medical condition of the patient and new clinical evidence as the patients care progreses.  The final decision regarding the patient's hospitalization status depends on the attending physician's judgement.    Scarlett PrestoMichael Yesli Vanderhoff MD, MBA,   Physician Advisor  [facility]

## 2018-02-25 NOTE — Progress Notes (Signed)
Speech Pathology  Orders received. Chart reviewed. Spoke with patient, RN and neurologist. RN and patient with no concerns regarding swallowing. Neurologist reported that swallow evaluation was not needed at this time given her presentation and hopeful to discharge patient.No SLP intervention is needed at this time.

## 2018-02-25 NOTE — Progress Notes (Addendum)
PLAN: Return home with 24 hour care vs SNF    CM attempted to call emergency contact to discuss TOC and complete assessment.  CM left vm with contact information.     UPDATE: Daughter [name] came to facility to visit.  Daughter's phone number is different.  New #[phone].    Patient lives alone in 1st floor apartment.  Her sister lives within a couple miles, her daughter lives 5 miles away.    Patient reportedly has mild cognitive impairment, but is otherwise functional at home and family has recently filed for Medicaid so that she can get some help in the home. We will complete a UAI if one has not been completed. They have some concern that she does not always take her medication as instructed.     Recommend SNF until she is functionally stronger with the plan to return home with some help. Send referral to [facility].  She has been to [facility] in the past and was very happy with their care and rehab.  Also spoke to daughter about the PACE program and will get information to her about PACE as an option for her to remain in her home safer, longer.    CM will assist with needs for TOC planning.          Kim Gibson Lad, RN, CHPN, [facility]

## 2018-02-25 NOTE — Other (Signed)
[facility]    DTC Progress Note    Recommendations/ Comments: chart reveiwed for hypoglycemia note D5 running at this time- please encourage po intake    Chart reviewed on Christiana FuchsBarbara J Zahradnik.    Patient is a 72 y.o. female with no  Noted history of DM    A1c:   Lab Results   Component Value Date/Time    Hemoglobin A1c 5.4 02/25/2018 03:49 AM    Hemoglobin A1c 5.2 09/13/2017 03:10 AM       Recent Glucose Results:   Lab Results   Component Value Date/Time    GLU 70 02/25/2018 03:49 AM    GLUCPOC 70 02/25/2018 07:12 AM    GLUCPOC 67 02/25/2018 06:56 AM    GLUCPOC 66 02/25/2018 06:43 AM        Lab Results   Component Value Date/Time    Creatinine 0.94 02/25/2018 03:49 AM     Estimated Creatinine Clearance: 57.2 mL/min (based on SCr of 0.94 mg/dL).    Active Orders   Diet    DIET DIABETIC CONSISTENT CARB Mechanical Soft        PO intake: No data found.    Current hospital DM medication: none    Will continue to follow as needed.    Thank you.  Donald SivaLeann Alexes Lamarque, RD, CDE  [facility]  Office:  [phone]          Time spent: 10 minutes
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
## 2018-02-25 NOTE — Progress Notes (Signed)
Speech Pathology  Orders received. Chart reviewed. Spoke with patient, RN and neurologist. RN and patient with no concerns regarding swallowing. Neurologist reported that swallow evaluation was not needed at this time given her presentation and hopeful to discharge patient.No SLP intervention is needed at this time.

## 2018-02-25 NOTE — Procedures (Signed)
[facility]   [address]   [place], [place] [id number]      Electroencephalogram    Procedure ID: [id number] Procedure Date: 02/25/2018   Patient Name: Christiana FuchsBarbara J Pereyra Date of Birth: Oct 17, 1945   Procedure Type: Routine Medical Record No: [id number]     INDICATION: Altered mental status    Medications:  Current Facility-Administered Medications   Medication Dose Route Frequency   ??? dextrose 10% infusion 125-250 mL  125-250 mL IntraVENous PRN   ??? glucagon (GLUCAGEN) injection 1 mg  1 mg IntraMUSCular PRN   ??? divalproex DR (DEPAKOTE) tablet 500 mg  500 mg Oral Q12H   ??? gabapentin (NEURONTIN) capsule 100 mg  100 mg Oral TID   ??? lacosamide (VIMPAT) tablet 100 mg  100 mg Oral Q12H   ??? topiramate (TOPAMAX) tablet 50 mg  50 mg Oral BID   ??? hydrALAZINE (APRESOLINE) tablet 25 mg  25 mg Oral TID PRN   ??? donepezil (ARICEPT) tablet 10 mg  10 mg Oral QHS   ??? fenofibrate nanocrystallized (TRICOR) tablet 48 mg  48 mg Oral QHS   ??? fluticasone propionate (FLONASE) 50 mcg/actuation nasal spray 1 Spray  1 Spray Both Nostrils DAILY   ??? citalopram (CELEXA) tablet 20 mg  20 mg Oral DAILY   ??? cholecalciferol (VITAMIN D3) tablet 1,000 Units  1,000 Units Oral DAILY   ??? sodium chloride (NS) flush 5-40 mL  5-40 mL IntraVENous Q8H   ??? sodium chloride (NS) flush 5-40 mL  5-40 mL IntraVENous PRN   ??? acetaminophen (TYLENOL) tablet 650 mg  650 mg Oral Q6H PRN   ??? ondansetron (ZOFRAN) injection 4 mg  4 mg IntraVENous Q6H PRN   ??? bisacodyl (DULCOLAX) tablet 5 mg  5 mg Oral DAILY PRN   ??? enoxaparin (LOVENOX) injection 40 mg  40 mg SubCUTAneous Q24H   ??? sodium chloride (NS) flush 5-40 mL  5-40 mL IntraVENous Q8H   ??? sodium chloride (NS) flush 5-40 mL  5-40 mL IntraVENous PRN   ??? acetaminophen (TYLENOL) tablet 650 mg  650 mg Oral Q4H PRN    Or   ??? acetaminophen (TYLENOL) solution 650 mg  650 mg Per NG tube Q4H PRN    Or   ??? acetaminophen (TYLENOL) suppository 650 mg  650 mg Rectal Q4H PRN   ??? aspirin chewable  tablet 81 mg  81 mg Oral DAILY   ??? glucose chewable tablet 16 g  4 Tab Oral PRN   ??? dextrose 10% infusion 125-250 mL  125-250 mL IntraVENous PRN   ??? dextrose 5% and 0.9% NaCl infusion  75 mL/hr IntraVENous CONTINUOUS       DESCRIPTION OF PROCEDURE: Electrodes were applied in accordance with the international 10-20 system of electrode placement.  EEG was reviewed in both bipolar and referential montages    Description of Activity:  During wakefulness, patient is only able to mount 6 to 7 hertz frequency that spreads anteriorly. Background slowing theta frequency is seen.    During drowsiness, there is attenuation of the underlying frequency and low voltage theta activity occurs bilaterally. Early stage of sleep are seen and symmetric.    Intermittent photic stimulation was performed and did not induce posterior driving responses.     No sharp or spike discharges, seizures or epileptiform discharges seen. No focal asymmetry.    Clinical Interpretation:  This EEG, performed during wakefulness and sleep is abnormal. There is mild to moderate generalized slowing. There is  no focal asymmetry, seizures or epileptiform discharges seen.

## 2018-02-25 NOTE — Progress Notes (Signed)
Progress Notes by Christiana Pellant, MD at 02/25/18 1717                Author: Christiana Pellant, MD  Service: Internal Medicine  Author Type: Physician       Filed: 02/25/18 1727  Date of Service: 02/25/18 1717  Status: Signed          Editor: Christiana Pellant, MD (Physician)                       Hospitalist Progress Note      NAME: Meghan [name]    DOB:  1946/06/22    MRN:  [id number]          Assessment / Plan:        Acute metabolic encephalopathy w/ aphasia, POA   R/o seizure versus CVA   R/o UTI   Hx of dementia, psychosis and sz do  -Admit patient to telemetry unit  -Continue seizure medication  -Seizure precautions   -Patient was evaluated by neurology, who feels that her symptoms may be related to medications   -MRI was negative   -EEG c/w encephalopathy,  No sz   -Echocardiogram normal   -Last carotid ultrasound was neg in 8/18   -Continue aspirin   -Hemoglobin A1c and lipid panel pending, TSH was normal in May   -Urine looks slightly abnormal with small leukocyte esterase few epithelial cells 10-20 white blood cells but no bacteria.  She has a normal white count, no fever although is a poor historian does not seem to have any suprapubic tenderness.  Will hold  off on treatment for possible urinary tract infection and follow-up culture shows 30K mixed flora, likely contaminated specimen, rather than UTI, consider antibiotics if she spikes a fever or develops any symptoms   -PT/OT/ST eval   -daughter does not feels pt is "quite baseline"   -neuro feels she is baseline, based on prior admissions   -care management to see for discharge planning for home health and medication management and therapy vs SNF, daughter wants SNF...apparently lives alone with some help from daughter and sister living nearby     Hypertension  -Continue home antihypertensive medication     History of Dementia   -Continue Aricept   ??   ??   Code Status: Full    Surrogate Decision Maker:   Heo,[name]  Child        ??       DVT Prophylaxis: Lovenox    GI Prophylaxis: not indicated   ??   Baseline: independent       25.0 - 29.9 Overweight  / Body mass index is 27.46 kg/m??.      Recommended Disposition : Home w/Family and HH PT, OT, RN 24 hr care vs SNF          Subjective:        Chief Complaint / Reason for Physician Visit   Aphasia      7/14 Patient is confused but denies any pain or shortness of breath.  She keeps repeating some of the questions that I asked her.  Her family is no longer present  but according to the nurse and neurology evaluation her mentation was back to baseline.  The patient cannot provide any history related to why she is in the hospital.  She thinks that she is here because her "blood was low."      7/15 pt seems confused. Continues to repeat what  ever I say and occ answers a previous question after long delay or after I asked another question.       Patient was evaluated at 3 PM      Discussed with RN events overnight.       Review of Systems:           Symptom  Y/N  Comments    Symptom  Y/N  Comments             Fever/Chills        Chest Pain                 Poor Appetite        Edema         Cough        Abdominal Pain                 Sputum        Joint Pain         SOB/DOE        Pruritis/Rash         Nausea/vomit        Tolerating PT/OT         Diarrhea        Tolerating Diet         Constipation        Other               Could NOT obtain due to:  confused          Objective:        VITALS:    Last 24hrs VS reviewed since prior progress note. Most recent are:   Patient Vitals for the past 24 hrs:            Temp  Pulse  Resp  BP  SpO2            02/25/18 1541  97.8 ??F (36.6 ??C)  62  18  120/71  100 %            02/25/18 1135  98.1 ??F (36.7 ??C)  60  18  120/69  97 %     02/25/18 0752  97.9 ??F (36.6 ??C)  64  18  107/53  100 %     02/25/18 0342  97.9 ??F (36.6 ??C)  62  18  108/71  100 %     02/25/18 0003  98.1 ??F (36.7 ??C)  66  18  95/52  97 %            02/24/18 2023  98.5 ??F (36.9 ??C)  66  16  101/51  99 %            Intake/Output Summary (Last 24 hours) at 02/25/2018 1717   Last data filed at 02/25/2018 0043     Gross per 24 hour        Intake  24181.25 ml        Output  --        Net  24181.25 ml            PHYSICAL EXAM:      Patient is awake and alert oriented to self and hospital.  She keeps repeating things. She does not look toxic or in any distress she is on room air.  Have a slightly pale mucous membranes tacky cardiovascular regular rate no obvious murmurs rubs or gallops.   Lungs are clear no wheezes rhonchi or crackles.  Abdomen  bowel sounds present soft nontender no CVA tenderness no suprapubic tenderness.  Extremities no clubbing cyanosis or edema.  She follows commands and moves all extremities has no obvious facial  abnormalities.  She does not appear to have a pronator drift she was able to lift both of her legs without difficulty.         Reviewed most current lab test results and cultures  YES   Reviewed most current radiology test results   YES   Review and summation of old records today    NO   Reviewed patient's current orders and MAR    YES   PMH/SH reviewed - no change compared  to H&P   ________________________________________________________________________   Care Plan discussed with:           Comments         Patient  y           Family              RN  y       Futures trader  y           Consultant   y  neuro                         Multidiciplinary team rounds were held today with case manager, nursing, pharmacist and Higher education careers adviser.  Patient's plan of care was discussed;  medications were reviewed and discharge planning was addressed.       ________________________________________________________________________   Total NON critical care TIME:  Minutes      Total CRITICAL CARE TIME Spent:   Minutes non procedure based              Comments         >50% of visit spent in counseling and coordination of care         ________________________________________________________________________    Christiana Pellant, MD       Procedures: see electronic medical records for all procedures/Xrays and details which were not copied into this note but were reviewed prior to creation of Plan.        LABS:   I reviewed today's most current labs and imaging studies.   Pertinent labs include:     Recent Labs           02/23/18   1642     WBC  4.7     HGB  13.6     HCT  42.4        PLT  472*          Recent Labs            02/25/18   0349  02/23/18   1642     NA  140  138     K  3.8  4.1     CL  110*  105     CO2  24  26     GLU  70  63*     BUN  7  21*     CREA  0.94  1.14*     CA  8.8  8.9     MG  2.0   --      PHOS  2.9   --      ALB   --   3.4*     TBILI   --   0.3     SGOT   --   33  ALT   --   21           Signed: Christiana Pellant, MD

## 2018-02-25 NOTE — Group Note (Signed)
Diabetes Mgmt by Flonnie HailstoneMorales, Leann W, RD at 02/25/18 1605                Author: Flonnie HailstoneMorales, Leann W, RD  Service: --  Author Type: Registered Dietitian       Filed: 02/25/18 1606  Date of Service: 02/25/18 1605  Status: Signed          Editor: Flonnie HailstoneMorales, Leann W, RD (Registered Dietitian)                       [facility]      DTC Progress Note      Recommendations/ Comments: chart reveiwed for hypoglycemia note D5 running at this time- please encourage po intake      Chart reviewed on Meghan FuchsBarbara J [name].      Patient is a 72 y.o. female with  no  Noted history of DM      A1c:      Lab Results         Component  Value  Date/Time            Hemoglobin A1c  5.4  02/25/2018 03:49 AM            Hemoglobin A1c  5.2  09/13/2017 03:10 AM           Recent Glucose Results:      Lab Results         Component  Value  Date/Time            GLU  70  02/25/2018 03:49 AM       GLUCPOC  70  02/25/2018 07:12 AM       GLUCPOC  67  02/25/2018 06:56 AM            GLUCPOC  66  02/25/2018 06:43 AM              Lab Results         Component  Value  Date/Time            Creatinine  0.94  02/25/2018 03:49 AM        Estimated Creatinine Clearance: 57.2 mL/min (based on SCr of 0.94 mg/dL).        Active Orders       Diet          DIET DIABETIC CONSISTENT CARB Mechanical Soft            PO intake: No data found.      Current hospital DM medication: none      Will continue to follow as needed.      Thank you.   Donald SivaLeann Morales, RD, CDE   [facility]   Office:  [phone]               Time spent: 10 minutes

## 2018-02-25 NOTE — Progress Notes (Signed)
PLAN: Return home with 24 hour care vs SNF    CM attempted to call emergency contact to discuss TOC and complete assessment.  CM left vm with contact information.     UPDATE: Daughter Suzette Battiest came to facility to visit.  Daughter's phone number is different.  New [phone].    Patient lives alone in 1st floor apartment.  Her sister lives within a couple miles, her daughter lives 5 miles away.    Patient reportedly has mild cognitive impairment, but is otherwise functional at home and family has recently filed for Medicaid so that she can get some help in the home. We will complete a UAI if one has not been completed. They have some concern that she does not always take her medication as instructed.     Recommend SNF until she is functionally stronger with the plan to return home with some help. Send referral to [facility].  She has been to [facility] in the past and was very happy with their care and rehab.  Also spoke to daughter about the PACE program and will get information to her about PACE as an option for her to remain in her home safer, longer.    CM will assist with needs for TOC planning.          Genia Harold, RN, CHPN, [facility]

## 2018-02-25 NOTE — Progress Notes (Signed)
Neurology Progress Note    Patient ID:  Meghan [name]  [id number]  72 y.o.  1945-11-05    CC: altered mental status    Subjective:      Patient has had no recurrence of AMS. Baseline dementia.  Brain MRI without contrast revealed no acute findings.    Current Facility-Administered Medications   Medication Dose Route Frequency   ??? dextrose 10% infusion 125-250 mL  125-250 mL IntraVENous PRN   ??? glucagon (GLUCAGEN) injection 1 mg  1 mg IntraMUSCular PRN   ??? divalproex DR (DEPAKOTE) tablet 500 mg  500 mg Oral Q12H   ??? gabapentin (NEURONTIN) capsule 100 mg  100 mg Oral TID   ??? lacosamide (VIMPAT) tablet 100 mg  100 mg Oral Q12H   ??? topiramate (TOPAMAX) tablet 50 mg  50 mg Oral BID   ??? hydrALAZINE (APRESOLINE) tablet 25 mg  25 mg Oral TID PRN   ??? donepezil (ARICEPT) tablet 10 mg  10 mg Oral QHS   ??? fenofibrate nanocrystallized (TRICOR) tablet 48 mg  48 mg Oral QHS   ??? fluticasone propionate (FLONASE) 50 mcg/actuation nasal spray 1 Spray  1 Spray Both Nostrils DAILY   ??? citalopram (CELEXA) tablet 20 mg  20 mg Oral DAILY   ??? cholecalciferol (VITAMIN D3) tablet 1,000 Units  1,000 Units Oral DAILY   ??? sodium chloride (NS) flush 5-40 mL  5-40 mL IntraVENous Q8H   ??? sodium chloride (NS) flush 5-40 mL  5-40 mL IntraVENous PRN   ??? acetaminophen (TYLENOL) tablet 650 mg  650 mg Oral Q6H PRN   ??? ondansetron (ZOFRAN) injection 4 mg  4 mg IntraVENous Q6H PRN   ??? bisacodyl (DULCOLAX) tablet 5 mg  5 mg Oral DAILY PRN   ??? enoxaparin (LOVENOX) injection 40 mg  40 mg SubCUTAneous Q24H   ??? sodium chloride (NS) flush 5-40 mL  5-40 mL IntraVENous Q8H   ??? sodium chloride (NS) flush 5-40 mL  5-40 mL IntraVENous PRN   ??? acetaminophen (TYLENOL) tablet 650 mg  650 mg Oral Q4H PRN    Or   ??? acetaminophen (TYLENOL) solution 650 mg  650 mg Per NG tube Q4H PRN    Or   ??? acetaminophen (TYLENOL) suppository 650 mg  650 mg Rectal Q4H PRN   ??? aspirin chewable tablet 81 mg  81 mg Oral DAILY   ??? glucose chewable tablet 16 g  4 Tab Oral PRN   ??? dextrose  10% infusion 125-250 mL  125-250 mL IntraVENous PRN   ??? dextrose 5% and 0.9% NaCl infusion  75 mL/hr IntraVENous CONTINUOUS        Review of Systems:    Pertinent items are noted in HPI.    Objective:     Patient Vitals for the past 8 hrs:   BP Temp Pulse Resp SpO2   02/25/18 1135 120/70 98.1 ??F (36.7 ??C) 60 18 97 %   02/25/18 0752 107/53 97.9 ??F (36.6 ??C) 64 18 100 %       No intake/output data recorded.  07/13 1901 - 07/15 0700  In: 25181.3 [I.V.:25181.3]  Out: -     Lab Review   Recent Results (from the past 24 hour(s))   GLUCOSE, POC    Collection Time: 02/24/18  9:55 PM   Result Value Ref Range    Glucose (POC) 87 65 - 100 mg/dL    Performed by Luther Redo (PCT)    METABOLIC PANEL, BASIC    Collection Time:  02/25/18  3:49 AM   Result Value Ref Range    Sodium 140 136 - 145 mmol/L    Potassium 3.8 3.5 - 5.1 mmol/L    Chloride 110 (H) 97 - 108 mmol/L    CO2 24 21 - 32 mmol/L    Anion gap 6 5 - 15 mmol/L    Glucose 70 65 - 100 mg/dL    BUN 7 6 - 20 MG/DL    Creatinine 6.04 5.40 - 1.02 MG/DL    BUN/Creatinine ratio 7 (L) 12 - 20      GFR est AA >60 >60 ml/min/1.66m2    GFR est non-AA 59 (L) >60 ml/min/1.50m2    Calcium 8.8 8.5 - 10.1 MG/DL   MAGNESIUM    Collection Time: 02/25/18  3:49 AM   Result Value Ref Range    Magnesium 2.0 1.6 - 2.4 mg/dL   PHOSPHORUS    Collection Time: 02/25/18  3:49 AM   Result Value Ref Range    Phosphorus 2.9 2.6 - 4.7 MG/DL   HEMOGLOBIN J8J WITH EAG    Collection Time: 02/25/18  3:49 AM   Result Value Ref Range    Hemoglobin A1c 5.4 4.2 - 6.3 %    Est. average glucose 108 mg/dL   LIPID PANEL    Collection Time: 02/25/18  3:49 AM   Result Value Ref Range    LIPID PROFILE          Cholesterol, total 198 <200 MG/DL    Triglyceride 90 <191 MG/DL    HDL Cholesterol 48 MG/DL    LDL, calculated 478 (H) 0 - 100 MG/DL    VLDL, calculated 18 MG/DL    CHOL/HDL Ratio 4.1 0.0 - 5.0     GLUCOSE, POC    Collection Time: 02/25/18  6:43 AM   Result Value Ref Range    Glucose (POC) 66 65 - 100 mg/dL     Performed by Luther Redo (PCT)    GLUCOSE, POC    Collection Time: 02/25/18  6:56 AM   Result Value Ref Range    Glucose (POC) 67 65 - 100 mg/dL    Performed by Luther Redo (PCT)    GLUCOSE, POC    Collection Time: 02/25/18  7:12 AM   Result Value Ref Range    Glucose (POC) 70 65 - 100 mg/dL    Performed by Luther Redo (PCT)      NEUROLOGICAL EXAM:  ??  Appearance:  The patient is well developed, well nourished, unable to provide a coherent history and is in no acute distress.   Mental Status: Oriented to place and person.  Fluent, no evidence of dysarthria or aphasia.  Good naming and repetition.  Follows simple commands. Poor memory. Mood and affect appropriate.   Cranial Nerves:   II - XII were intact.     Motor:  5/5 strength. Normal bulk and tone. No pronator drift.   Reflexes:   Deep tendon reflexes were symmetrical.  Downgoing toes.   Sensory:   Normal to cold and pinprick.   Gait:  Not tested.   Tremor:   No tremor noted.   Cerebellar:  No cerebellar signs present.       Assessment:   Encephalopathy  Seizure  Dementia    Plan:   Neurological examination was nonfocal.    ??  Head CT without contrast did not reveal any acute process.  Brain MRI without contrast was unremarkable.  No acute stroke.  ??  No  evidence that issues due to breakthrough seizures.  EEG revealed no seizures. Mild to moderate generalized slowing as seen in encephalopathies or TBI or significant dementia. Continue current seizure medications.  ??  Patient with baseline dementia.  Continue Aricept.  ??  Recommend evaluation by case manager in relation to compliance at home regarding her medications.  May need to home health visit to assess compliance.  Medications positive prepared by her daughter.  Need to assure that recurrent issues with alteration of mentation is not due to medication noncompliance.  ??  No need to change patient's current medication.  Okay for discharge from a neurological standpoint. Follow up with outpatient neurology, Dr.  Lennart PallAralu.    Signed:  Tobi BastosHernan T Gatuslao Jr., MD  02/25/2018  2:20 PM

## 2018-02-26 ENCOUNTER — Encounter

## 2018-02-26 MED ORDER — ATORVASTATIN 40 MG TAB
40 mg | Freq: Every evening | ORAL | Status: DC
Start: 2018-02-26 — End: 2018-02-27
  Administered 2018-02-27: 03:00:00 via ORAL

## 2018-02-26 MED FILL — FLUTICASONE 50 MCG/ACTUATION NASAL SPRAY, SUSP: 50 mcg/actuation | NASAL | Qty: 16

## 2018-02-26 MED FILL — DEPAKOTE 250 MG TABLET,DELAYED RELEASE: 250 mg | ORAL | Qty: 2

## 2018-02-26 MED FILL — BD POSIFLUSH NORMAL SALINE 0.9 % INJECTION SYRINGE: INTRAMUSCULAR | Qty: 10

## 2018-02-26 MED FILL — VIMPAT 100 MG TABLET: 100 mg | ORAL | Qty: 1

## 2018-02-26 MED FILL — ARICEPT 5 MG TABLET: 5 mg | ORAL | Qty: 2

## 2018-02-26 MED FILL — GABAPENTIN 100 MG CAP: 100 mg | ORAL | Qty: 1

## 2018-02-26 MED FILL — TOPIRAMATE 25 MG TAB: 25 mg | ORAL | Qty: 2

## 2018-02-26 MED FILL — VITAMIN D3 25 MCG (1,000 UNIT) TABLET: 25 mcg (1,000 unit) | ORAL | Qty: 1

## 2018-02-26 MED FILL — CHILDREN'S ASPIRIN 81 MG CHEWABLE TABLET: 81 mg | ORAL | Qty: 1

## 2018-02-26 MED FILL — CITALOPRAM 20 MG TAB: 20 mg | ORAL | Qty: 1

## 2018-02-26 MED FILL — ENOXAPARIN 40 MG/0.4 ML SUB-Q SYRINGE: 40 mg/0.4 mL | SUBCUTANEOUS | Qty: 0.4

## 2018-02-26 MED FILL — TRICOR 48 MG TABLET: 48 mg | ORAL | Qty: 1

## 2018-02-26 NOTE — Progress Notes (Addendum)
Hospitalist Progress Note    NAME: Meghan [name]   DOB:  [birth year]   MRN:  [id number]       Assessment / Plan:    Acute metabolic encephalopathy w/ aphasia, POA  R/o seizure versus CVA  R/o UTI  Hx of dementia, appears to be fairly advacned  Hx of psychosis and sz do  ???Admit patient to telemetry unit  ???Continue seizure medication  ???Seizure precautions  -Patient was evaluated by neurology, who feels that her symptoms may be related to medications  -MRI was negative  -EEG c/w encephalopathy,  No sz  -Echocardiogram normal  -Last carotid ultrasound was neg in 8/18  -Continue aspirin, add statin  -Hemoglobin A1c and lLDL 132, TSH was normal in May  -Urine looks slightly abnormal with small leukocyte esterase few epithelial cells 10-20 white blood cells but no bacteria.  She has a normal white count, no fever although is a poor historian does not seem to have any suprapubic tenderness.  Will hold off on treatment for possible urinary tract infection and follow-up culture shows 30K mixed flora, likely contaminated specimen, rather than UTI, consider antibiotics if she spikes a fever or develops any symptoms  -PT/OT/ST eval  -daughter does not feels pt is "quite baseline", sister reported she has significant "dementia"  -neuro feels she is baseline, based on prior admissions  -care management to see for discharge planning for home health and medication management and therapy vs SNF, daughter wants SNF...apparently lives alone with some help from daughter and sister living nearby  -awaiting insurance auth for dc    Hypertension  HL  ???Continue home antihypertensive medication  -add statin    History of Dementia   ???Continue Aricept  ??  ??  Code Status: Full   Surrogate Decision Maker:  Dizdarevic,[name] Child   ??    DVT Prophylaxis: Lovenox   GI Prophylaxis: not indicated  ??  Baseline: independent     25.0 - 29.9 Overweight / Body mass index is 27.46 kg/m??.     Recommended Disposition: lives alone, needs 24hr care, SNF await insurance auth     Subjective:     Chief Complaint / Reason for Physician Visit  Aphasia    7/14 Patient is confused but denies any pain or shortness of breath.  She keeps repeating some of the questions that I asked her.  Her family is no longer present but according to the nurse and neurology evaluation her mentation was back to baseline.  The patient cannot provide any history related to why she is in the hospital.  She thinks that she is here because her "blood was low."    7/15 pt seems confused. Continues to repeat what ever I say and occ answers a previous question after long delay or after I asked another question.     7/16 pt's nurse reported more interactive and conversing appropriately this morning.  When I saw her she kept repeating all my questions like she has been,     Patient was evaluated at 2 PM    Discussed with RN events overnight.     Review of Systems:  Symptom Y/N Comments  Symptom Y/N Comments   Fever/Chills    Chest Pain     Poor Appetite    Edema     Cough    Abdominal Pain     Sputum    Joint Pain     SOB/DOE    Pruritis/Rash     Nausea/vomit  Tolerating PT/OT     Diarrhea    Tolerating Diet     Constipation    Other       Could NOT obtain due to: confused     Objective:     VITALS:   Last 24hrs VS reviewed since prior progress note. Most recent are:  Patient Vitals for the past 24 hrs:   Temp Pulse Resp BP SpO2   02/26/18 1529 97.4 ??F (36.3 ??C) ??? 16 113/63 96 %   02/26/18 1143 98 ??F (36.7 ??C) 62 16 122/65 100 %   02/26/18 0745 97.9 ??F (36.6 ??C) (!) 59 16 123/75 100 %   02/26/18 0256 97.7 ??F (36.5 ??C) (!) 56 18 104/63 99 %   02/25/18 2351 98.2 ??F (36.8 ??C) 62 18 126/69 95 %   02/25/18 1914 98.5 ??F (36.9 ??C) 64 18 132/66 100 %     No intake or output data in the 24 hours ending 02/26/18 1725     PHYSICAL EXAM:    Patient is awake and alert oriented to self and hospital.  She keeps  repeating things. She does not look toxic or in any distress she is on room air.   mucous membranes moist cardiovascular regular rate no obvious murmurs rubs or gallops.  Lungs are clear no wheezes rhonchi or crackles.  Abdomen bowel sounds present soft nontender no CVA tenderness no suprapubic tenderness.  Extremities no clubbing cyanosis or edema.  She follows commands and moves all extremities has no obvious facial abnormalities.  She does not appear to have a pronator drift she was able to lift both of her legs without difficulty.      Reviewed most current lab test results and cultures  YES  Reviewed most current radiology test results   YES  Review and summation of old records today    NO  Reviewed patient's current orders and MAR    YES  PMH/SH reviewed - no change compared to H&P  ________________________________________________________________________  Care Plan discussed with:    Comments   Patient y    Family      RN y    Futures traderCare Manager y    Consultant  y neuro                    y Multidiciplinary team rounds were held today with case Production designer, theatre/television/filmmanager, nursing, pharmacist and Higher education careers adviserclinical coordinator.  Patient's plan of care was discussed; medications were reviewed and discharge planning was addressed.     ________________________________________________________________________  Total NON critical care TIME:  Minutes    Total CRITICAL CARE TIME Spent:   Minutes non procedure based      Comments   >50% of visit spent in counseling and coordination of care     ________________________________________________________________________  Christiana PellantNoemi G Shellie Rogoff, MD     Procedures: see electronic medical records for all procedures/Xrays and details which were not copied into this note but were reviewed prior to creation of Plan.      LABS:  I reviewed today's most current labs and imaging studies.  Pertinent labs include:  No results for input(s): WBC, HGB, HCT, PLT, HGBEXT, HCTEXT, PLTEXT, HGBEXT, HCTEXT, PLTEXT in the last 72 hours.   Recent Labs     02/25/18  0349   NA 140   K 3.8   CL 110*   CO2 24   GLU 70   BUN 7   CREA 0.94   CA 8.8   MG 2.0  PHOS 2.9       Signed: Christiana Pellant, MD

## 2018-02-26 NOTE — Progress Notes (Addendum)
CM provided pt with CODE 44, [name], and Observation letter. CM contacted pt daughter, [name], as pt is not alert and oriented. Copies of letters left for pt daughter in pt room. Pt daughter verbalized understanding. Copies with explanation placed in pt chart.         Jamila Goodall, Care Manager  [phone]

## 2018-02-26 NOTE — Progress Notes (Signed)
Spiritual Care Partner Volunteer visited patient in Neuroscience Telemetry on February 26, 2018.  Documented by:  Chaplain Susan Varner, M.Div   Chaplain Paging Service [phone]

## 2018-02-26 NOTE — Progress Notes (Signed)
UAI was submitted for processing to [employer] today at 11:20 AM.      Heather Hudson, CM  Ext [phone]

## 2018-02-26 NOTE — Progress Notes (Signed)
Problem: Falls - Risk of  Goal: *Absence of Falls  Description  Document Bridgette Habermann Fall Risk and appropriate interventions in the flowsheet.  Outcome: Progressing Towards Goal     Problem: Patient Education: Go to Patient Education Activity  Goal: Patient/Family Education  Outcome: Progressing Towards Goal     Problem: Pressure Injury - Risk of  Goal: *Prevention of pressure injury  Description  Document Braden Scale and appropriate interventions in the flowsheet.  Outcome: Progressing Towards Goal     Problem: Patient Education: Go to Patient Education Activity  Goal: Patient/Family Education  Outcome: Progressing Towards Goal     Problem: Patient Education: Go to Patient Education Activity  Goal: Patient/Family Education  Outcome: Progressing Towards Goal     Problem: TIA/CVA Stroke: 0-24 hours  Goal: Off Pathway (Use only if patient is Off Pathway)  Outcome: Progressing Towards Goal  Goal: Activity/Safety  Outcome: Progressing Towards Goal  Goal: Consults, if ordered  Outcome: Progressing Towards Goal  Goal: Diagnostic Test/Procedures  Outcome: Progressing Towards Goal  Goal: Nutrition/Diet  Outcome: Progressing Towards Goal  Goal: Discharge Planning  Outcome: Progressing Towards Goal  Goal: Medications  Outcome: Progressing Towards Goal  Goal: Respiratory  Outcome: Progressing Towards Goal  Goal: Treatments/Interventions/Procedures  Outcome: Progressing Towards Goal  Goal: Minimize risk of bleeding post-thrombolytic infusion  Outcome: Progressing Towards Goal  Goal: Monitor for complications post-thrombolytic infusion  Outcome: Progressing Towards Goal  Goal: Psychosocial  Outcome: Progressing Towards Goal  Goal: *Hemodynamically stable  Outcome: Progressing Towards Goal  Goal: *Neurologically stable  Description  Absence of additional neurological deficits    Outcome: Progressing Towards Goal  Goal: *Verbalizes anxiety and depression are reduced or absent  Outcome: Progressing Towards Goal   Goal: *Absence of Signs of Aspiration on Current Diet  Outcome: Progressing Towards Goal  Goal: *Absence of deep venous thrombosis signs and symptoms(Stroke Metric)  Outcome: Progressing Towards Goal  Goal: *Ability to perform ADLs and demonstrates progressive mobility and function  Outcome: Progressing Towards Goal  Goal: *Stroke education started(Stroke Metric)  Outcome: Progressing Towards Goal  Goal: *Dysphagia screen performed(Stroke Metric)  Outcome: Progressing Towards Goal  Goal: *Rehab consulted(Stroke Metric)  Outcome: Progressing Towards Goal     Problem: TIA/CVA Stroke: Day 2 Until Discharge  Goal: Diagnostic Test/Procedures  Outcome: Progressing Towards Goal  Goal: Nutrition/Diet  Outcome: Progressing Towards Goal  Goal: Discharge Planning  Outcome: Progressing Towards Goal  Goal: Medications  Outcome: Progressing Towards Goal  Goal: Respiratory  Outcome: Progressing Towards Goal  Goal: Treatments/Interventions/Procedures  Outcome: Progressing Towards Goal  Goal: Psychosocial  Outcome: Progressing Towards Goal     Problem: Ischemic Stroke: Discharge Outcomes  Goal: *Verbalizes anxiety and depression are reduced or absent  Outcome: Progressing Towards Goal  Goal: *Verbalize understanding of risk factor modification(Stroke Metric)  Outcome: Progressing Towards Goal  Goal: *Hemodynamically stable  Outcome: Progressing Towards Goal  Goal: *Absence of aspiration pneumonia  Outcome: Progressing Towards Goal  Goal: *Aware of needed dietary changes  Outcome: Progressing Towards Goal  Goal: *Verbalize understanding of prescribed medications including anti-coagulants, anti-lipid, and/or anti-platelets(Stroke Metric)  Outcome: Progressing Towards Goal  Goal: *Tolerating diet  Outcome: Progressing Towards Goal  Goal: *Aware of follow-up diagnostics related to anticoagulants  Outcome: Progressing Towards Goal  Goal: *Ability to perform ADLs and demonstrates progressive mobility and function   Outcome: Progressing Towards Goal  Goal: *Absence of DVT(Stroke Metric)  Outcome: Progressing Towards Goal  Goal: *Absence of aspiration  Outcome: Progressing Towards Goal  Goal: *Optimal pain control at patient's  stated goal  Outcome: Progressing Towards Goal  Goal: *Home safety concerns addressed  Outcome: Progressing Towards Goal  Goal: *Describes available resources and support systems  Outcome: Progressing Towards Goal  Goal: *Verbalizes understanding of activation of EMS(911) for stroke symptoms(Stroke Metric)  Outcome: Progressing Towards Goal  Goal: *Understands and describes signs and symptoms to report to providers(Stroke Metric)  Outcome: Progressing Towards Goal  Goal: *Neurolgocially stable (absence of additional neurological deficits)  Outcome: Progressing Towards Goal  Goal: *Verbalizes importance of follow-up with primary care physician(Stroke Metric)  Outcome: Progressing Towards Goal  Goal: *Smoking cessation discussed,if applicable(Stroke Metric)  Outcome: Progressing Towards Goal  Goal: *Depression screening completed(Stroke Metric)  Outcome: Progressing Towards Goal     Problem: Aspiration - Risk of  Goal: *Absence of aspiration  Outcome: Progressing Towards Goal     Problem: Patient Education: Go to Patient Education Activity  Goal: Patient/Family Education  Outcome: Progressing Towards Goal
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
## 2018-02-26 NOTE — Progress Notes (Signed)
Problem: Falls - Risk of  Goal: *Absence of Falls  Description  Document [name] Fall Risk and appropriate interventions in the flowsheet.  Outcome: Progressing Towards Goal     Problem: Patient Education: Go to Patient Education Activity  Goal: Patient/Family Education  Outcome: Progressing Towards Goal     Problem: Pressure Injury - Risk of  Goal: *Prevention of pressure injury  Description  Document Braden Scale and appropriate interventions in the flowsheet.  Outcome: Progressing Towards Goal     Problem: Patient Education: Go to Patient Education Activity  Goal: Patient/Family Education  Outcome: Progressing Towards Goal     Problem: TIA/CVA Stroke: 0-24 hours  Goal: Off Pathway (Use only if patient is Off Pathway)  Outcome: Progressing Towards Goal     Problem: Aspiration - Risk of  Goal: *Absence of aspiration  Outcome: Progressing Towards Goal     Problem: Patient Education: Go to Patient Education Activity  Goal: Patient/Family Education  Outcome: Progressing Towards Goal

## 2018-02-26 NOTE — Progress Notes (Signed)
CM provided pt with CODE 44, [name], and Observation letter. CM contacted pt daughter, Meghan [name], as pt is not alert and oriented. Copies of letters left for pt daughter in pt room. Pt daughter verbalized understanding. Copies with explanation placed in pt chart.         Estelle JuneJamila Goodall, Care Manager  [phone]

## 2018-02-26 NOTE — Progress Notes (Signed)
Progress Notes by Christiana Pellant, MD at 02/26/18 1725                Author: Christiana Pellant, MD  Service: Internal Medicine  Author Type: Physician       Filed: 02/26/18 1730  Date of Service: 02/26/18 1725  Status: Addendum          Editor: Christiana Pellant, MD (Physician)          Related Notes: Original Note by Christiana Pellant, MD (Physician) filed at 02/26/18 1728                       Hospitalist Progress Note      NAME: Meghan [name]    DOB:  [birth year]    MRN:  [id number]          Assessment / Plan:        Acute metabolic encephalopathy w/ aphasia, POA   R/o seizure versus CVA   R/o UTI   Hx of dementia, appears to be fairly advacned   Hx of psychosis and sz do  -Admit patient to telemetry unit  -Continue seizure medication  -Seizure precautions   -Patient was evaluated by neurology, who feels that her symptoms may be related to medications   -MRI was negative   -EEG c/w encephalopathy,  No sz   -Echocardiogram normal   -Last carotid ultrasound was neg in 8/18   -Continue aspirin, add statin   -Hemoglobin A1c and lLDL 132, TSH was normal in May   -Urine looks slightly abnormal with small leukocyte esterase few epithelial cells 10-20 white blood cells but no bacteria.  She has a normal white count, no fever although is a poor historian does not seem to have any suprapubic tenderness.  Will hold  off on treatment for possible urinary tract infection and follow-up culture shows 30K mixed flora, likely contaminated specimen, rather than UTI, consider antibiotics if she spikes a fever or develops any symptoms   -PT/OT/ST eval   -daughter does not feels pt is "quite baseline", sister reported she has significant "dementia"   -neuro feels she is baseline, based on prior admissions   -care management to see for discharge planning for home health and medication management and therapy vs SNF, daughter wants SNF...apparently lives alone with some help from daughter and sister living nearby   -awaiting  insurance auth for dc     Hypertension   HL  -Continue home antihypertensive medication   -add statin    History of Dementia   -Continue Aricept   ??   ??   Code Status: Full    Surrogate Decision Maker:   Dehner,[name]  Child        ??      DVT Prophylaxis: Lovenox    GI Prophylaxis: not indicated   ??   Baseline: independent       25.0 - 29.9 Overweight  / Body mass index is 27.46 kg/m??.      Recommended Disposition : lives alone, needs 24hr care, SNF await insurance auth          Subjective:        Chief Complaint / Reason for Physician Visit   Aphasia      7/14 Patient is confused but denies any pain or shortness of breath.  She keeps repeating some of the questions that I asked her.  Her family is no longer present  but according to the nurse  and neurology evaluation her mentation was back to baseline.  The patient cannot provide any history related to why she is in the hospital.  She thinks that she is here because her "blood was low."      7/15 pt seems confused. Continues to repeat what ever I say and occ answers a previous question after long delay or after I asked another question.       7/16 pt's nurse reported more interactive and conversing appropriately this morning.  When I saw her she kept repeating all my questions like she has been,       Patient was evaluated at 2 PM      Discussed with RN events overnight.       Review of Systems:           Symptom  Y/N  Comments    Symptom  Y/N  Comments             Fever/Chills        Chest Pain                 Poor Appetite        Edema         Cough        Abdominal Pain                 Sputum        Joint Pain         SOB/DOE        Pruritis/Rash         Nausea/vomit        Tolerating PT/OT         Diarrhea        Tolerating Diet         Constipation        Other               Could NOT obtain due to:  confused          Objective:        VITALS:    Last 24hrs VS reviewed since prior progress note. Most recent are:   Patient Vitals for the past 24 hrs:             Temp  Pulse  Resp  BP  SpO2            02/26/18 1529  97.4 ??F (36.3 ??C)  --  16  113/63  96 %            02/26/18 1143  98 ??F (36.7 ??C)  62  16  122/65  100 %     02/26/18 0745  97.9 ??F (36.6 ??C)  (!) 59  16  123/75  100 %     02/26/18 0256  97.7 ??F (36.5 ??C)  (!) 56  18  104/63  99 %     02/25/18 2351  98.2 ??F (36.8 ??C)  62  18  126/69  95 %            02/25/18 1914  98.5 ??F (36.9 ??C)  64  18  132/66  100 %        No intake or output data in the 24 hours ending 02/26/18 1725       PHYSICAL EXAM:      Patient is awake and alert oriented to self and hospital.  She keeps repeating things. She does not look toxic or in any distress she is on  room air.   mucous membranes moist cardiovascular regular rate no obvious murmurs rubs or gallops.  Lungs are clear  no wheezes rhonchi or crackles.  Abdomen bowel sounds present soft nontender no CVA tenderness no suprapubic tenderness.  Extremities no clubbing cyanosis or edema.  She follows commands and moves all extremities has no obvious facial abnormalities.   She does not appear to have a pronator drift she was able to lift both of her legs without difficulty.         Reviewed most current lab test results and cultures  YES   Reviewed most current radiology test results   YES   Review and summation of old records today    NO   Reviewed patient's current orders and MAR    YES   PMH/SH reviewed - no change compared  to H&P   ________________________________________________________________________   Care Plan discussed with:           Comments         Patient  y           Family              RN  y       Futures trader  y           Consultant   y  neuro                       y  Multidiciplinary team rounds were held today with case Production designer, theatre/television/film, nursing, pharmacist and Higher education careers adviser.  Patient's plan of care was discussed;  medications were reviewed and discharge planning was addressed.       ________________________________________________________________________   Total NON  critical care TIME:  Minutes      Total CRITICAL CARE TIME Spent:   Minutes non procedure based              Comments         >50% of visit spent in counseling and coordination of care         ________________________________________________________________________   Christiana Pellant, MD       Procedures: see electronic medical records for all procedures/Xrays and details which were not copied into this note but were reviewed prior to creation of Plan.        LABS:   I reviewed today's most current labs and imaging studies.   Pertinent labs include:   No results for input(s): WBC, HGB, HCT, PLT, HGBEXT, HCTEXT, PLTEXT, HGBEXT, HCTEXT, PLTEXT in the last 72 hours.     Recent Labs           02/25/18   0349     NA  140     K  3.8     CL  110*     CO2  24     GLU  70     BUN  7     CREA  0.94     CA  8.8     MG  2.0        PHOS  2.9           Signed: Christiana Pellant, MD

## 2018-02-26 NOTE — Progress Notes (Signed)
UAI was submitted for processing to [facility] today at 11:20 AM.      Leone Brand, CM  Ext [phone]

## 2018-02-26 NOTE — Progress Notes (Signed)
Problem: Falls - Risk of  Goal: *Absence of Falls  Description  Document Bridgette Habermann Fall Risk and appropriate interventions in the flowsheet.  Outcome: Progressing Towards Goal     Problem: Patient Education: Go to Patient Education Activity  Goal: Patient/Family Education  Outcome: Progressing Towards Goal     Problem: Pressure Injury - Risk of  Goal: *Prevention of pressure injury  Description  Document Braden Scale and appropriate interventions in the flowsheet.  Outcome: Progressing Towards Goal     Problem: Patient Education: Go to Patient Education Activity  Goal: Patient/Family Education  Outcome: Progressing Towards Goal     Problem: Patient Education: Go to Patient Education Activity  Goal: Patient/Family Education  Outcome: Progressing Towards Goal     Problem: TIA/CVA Stroke: 0-24 hours  Goal: Off Pathway (Use only if patient is Off Pathway)  Outcome: Progressing Towards Goal  Goal: Activity/Safety  Outcome: Progressing Towards Goal  Goal: Consults, if ordered  Outcome: Progressing Towards Goal  Goal: Diagnostic Test/Procedures  Outcome: Progressing Towards Goal  Goal: Nutrition/Diet  Outcome: Progressing Towards Goal  Goal: Discharge Planning  Outcome: Progressing Towards Goal  Goal: Medications  Outcome: Progressing Towards Goal  Goal: Respiratory  Outcome: Progressing Towards Goal  Goal: Treatments/Interventions/Procedures  Outcome: Progressing Towards Goal  Goal: Minimize risk of bleeding post-thrombolytic infusion  Outcome: Progressing Towards Goal  Goal: Monitor for complications post-thrombolytic infusion  Outcome: Progressing Towards Goal  Goal: Psychosocial  Outcome: Progressing Towards Goal  Goal: *Hemodynamically stable  Outcome: Progressing Towards Goal  Goal: *Neurologically stable  Description  Absence of additional neurological deficits    Outcome: Progressing Towards Goal  Goal: *Verbalizes anxiety and depression are reduced or absent  Outcome: Progressing Towards Goal  Goal: *Absence of  Signs of Aspiration on Current Diet  Outcome: Progressing Towards Goal  Goal: *Absence of deep venous thrombosis signs and symptoms(Stroke Metric)  Outcome: Progressing Towards Goal  Goal: *Ability to perform ADLs and demonstrates progressive mobility and function  Outcome: Progressing Towards Goal  Goal: *Stroke education started(Stroke Metric)  Outcome: Progressing Towards Goal  Goal: *Dysphagia screen performed(Stroke Metric)  Outcome: Progressing Towards Goal  Goal: *Rehab consulted(Stroke Metric)  Outcome: Progressing Towards Goal     Problem: TIA/CVA Stroke: Day 2 Until Discharge  Goal: Diagnostic Test/Procedures  Outcome: Progressing Towards Goal  Goal: Nutrition/Diet  Outcome: Progressing Towards Goal  Goal: Discharge Planning  Outcome: Progressing Towards Goal  Goal: Medications  Outcome: Progressing Towards Goal  Goal: Respiratory  Outcome: Progressing Towards Goal  Goal: Treatments/Interventions/Procedures  Outcome: Progressing Towards Goal  Goal: Psychosocial  Outcome: Progressing Towards Goal     Problem: Ischemic Stroke: Discharge Outcomes  Goal: *Verbalizes anxiety and depression are reduced or absent  Outcome: Progressing Towards Goal  Goal: *Verbalize understanding of risk factor modification(Stroke Metric)  Outcome: Progressing Towards Goal  Goal: *Hemodynamically stable  Outcome: Progressing Towards Goal  Goal: *Absence of aspiration pneumonia  Outcome: Progressing Towards Goal  Goal: *Aware of needed dietary changes  Outcome: Progressing Towards Goal  Goal: *Verbalize understanding of prescribed medications including anti-coagulants, anti-lipid, and/or anti-platelets(Stroke Metric)  Outcome: Progressing Towards Goal  Goal: *Tolerating diet  Outcome: Progressing Towards Goal  Goal: *Aware of follow-up diagnostics related to anticoagulants  Outcome: Progressing Towards Goal  Goal: *Ability to perform ADLs and demonstrates progressive mobility and function  Outcome: Progressing Towards Goal  Goal:  *Absence of DVT(Stroke Metric)  Outcome: Progressing Towards Goal  Goal: *Absence of aspiration  Outcome: Progressing Towards Goal  Goal: *Optimal pain control at patient's  stated goal  Outcome: Progressing Towards Goal  Goal: *Home safety concerns addressed  Outcome: Progressing Towards Goal  Goal: *Describes available resources and support systems  Outcome: Progressing Towards Goal  Goal: *Verbalizes understanding of activation of EMS(911) for stroke symptoms(Stroke Metric)  Outcome: Progressing Towards Goal  Goal: *Understands and describes signs and symptoms to report to providers(Stroke Metric)  Outcome: Progressing Towards Goal  Goal: *Neurolgocially stable (absence of additional neurological deficits)  Outcome: Progressing Towards Goal  Goal: *Verbalizes importance of follow-up with primary care physician(Stroke Metric)  Outcome: Progressing Towards Goal  Goal: *Smoking cessation discussed,if applicable(Stroke Metric)  Outcome: Progressing Towards Goal  Goal: *Depression screening completed(Stroke Metric)  Outcome: Progressing Towards Goal     Problem: Aspiration - Risk of  Goal: *Absence of aspiration  Outcome: Progressing Towards Goal     Problem: Patient Education: Go to Patient Education Activity  Goal: Patient/Family Education  Outcome: Progressing Towards Goal

## 2018-02-26 NOTE — Progress Notes (Signed)
Problem: Falls - Risk of  Goal: *Absence of Falls  Description  Document Meghan [name] Fall Risk and appropriate interventions in the flowsheet.  Outcome: Progressing Towards Goal     Problem: Patient Education: Go to Patient Education Activity  Goal: Patient/Family Education  Outcome: Progressing Towards Goal     Problem: Pressure Injury - Risk of  Goal: *Prevention of pressure injury  Description  Document Braden Scale and appropriate interventions in the flowsheet.  Outcome: Progressing Towards Goal     Problem: Patient Education: Go to Patient Education Activity  Goal: Patient/Family Education  Outcome: Progressing Towards Goal     Problem: TIA/CVA Stroke: 0-24 hours  Goal: Off Pathway (Use only if patient is Off Pathway)  Outcome: Progressing Towards Goal     Problem: Aspiration - Risk of  Goal: *Absence of aspiration  Outcome: Progressing Towards Goal     Problem: Patient Education: Go to Patient Education Activity  Goal: Patient/Family Education  Outcome: Progressing Towards Goal

## 2018-02-27 LAB — METABOLIC PANEL, BASIC
Anion gap: 8 mmol/L (ref 5–15)
BUN/Creatinine ratio: 9 — ABNORMAL LOW (ref 12–20)
BUN: 8 MG/DL (ref 6–20)
CO2: 23 mmol/L (ref 21–32)
Calcium: 8.9 MG/DL (ref 8.5–10.1)
Chloride: 107 mmol/L (ref 97–108)
Creatinine: 0.92 MG/DL (ref 0.55–1.02)
GFR est AA: >60 mL/min/{1.73_m2} (ref >60)
GFR est non-AA: >60 mL/min/{1.73_m2} (ref >60)
Glucose: 63 mg/dL — ABNORMAL LOW (ref 65–100)
Potassium: 3.8 mmol/L (ref 3.5–5.1)
Sodium: 138 mmol/L (ref 136–145)

## 2018-02-27 LAB — MAGNESIUM
Magnesium: 1.8 mg/dL (ref 1.6–2.4)
Magnesium: 1.8 mg/dL (ref 1.6–2.4)

## 2018-02-27 LAB — BASIC METABOLIC PANEL
Anion Gap: 8 mmol/L (ref 5–15)
BUN: 8 MG/DL (ref 6–20)
Bun/Cre Ratio: 9 — ABNORMAL LOW (ref 12–20)
CO2: 23 mmol/L (ref 21–32)
Calcium: 8.9 MG/DL (ref 8.5–10.1)
Chloride: 107 mmol/L (ref 97–108)
Creatinine: 0.92 MG/DL (ref 0.55–1.02)
EGFR IF NonAfrican American: >60 mL/min/{1.73_m2} (ref >60)
GFR African American: >60 mL/min/{1.73_m2} (ref >60)
Glucose: 63 mg/dL — ABNORMAL LOW (ref 65–100)
Potassium: 3.8 mmol/L (ref 3.5–5.1)
Sodium: 138 mmol/L (ref 136–145)

## 2018-02-27 MED ORDER — ATORVASTATIN 40 MG TAB
40 mg | ORAL_TABLET | Freq: Every evening | ORAL | 0 refills | Status: AC
Start: 2018-02-27 — End: 2018-03-29

## 2018-02-27 MED ORDER — DIVALPROEX 500 MG TAB, DELAYED RELEASE
500 mg | ORAL_TABLET | ORAL | 0 refills | Status: AC
Start: 2018-02-27 — End: ?

## 2018-02-27 MED ORDER — GABAPENTIN 100 MG CAP
100 mg | ORAL_CAPSULE | Freq: Three times a day (TID) | ORAL | 0 refills | Status: AC
Start: 2018-02-27 — End: 2018-03-02

## 2018-02-27 MED FILL — TOPIRAMATE 25 MG TAB: 25 mg | ORAL | Qty: 2

## 2018-02-27 MED FILL — VIMPAT 100 MG TABLET: 100 mg | ORAL | Qty: 1

## 2018-02-27 MED FILL — GABAPENTIN 100 MG CAP: 100 mg | ORAL | Qty: 1

## 2018-02-27 MED FILL — TRICOR 48 MG TABLET: 48 mg | ORAL | Qty: 1

## 2018-02-27 MED FILL — ATORVASTATIN 40 MG TAB: 40 mg | ORAL | Qty: 1

## 2018-02-27 MED FILL — ARICEPT 5 MG TABLET: 5 mg | ORAL | Qty: 2

## 2018-02-27 MED FILL — DEPAKOTE 250 MG TABLET,DELAYED RELEASE: 250 mg | ORAL | Qty: 2

## 2018-02-27 MED FILL — CITALOPRAM 20 MG TAB: 20 mg | ORAL | Qty: 1

## 2018-02-27 MED FILL — VITAMIN D3 25 MCG (1,000 UNIT) TABLET: 25 mcg (1,000 unit) | ORAL | Qty: 1

## 2018-02-27 MED FILL — ENOXAPARIN 40 MG/0.4 ML SUB-Q SYRINGE: 40 mg/0.4 mL | SUBCUTANEOUS | Qty: 0.4

## 2018-02-27 MED FILL — CHILDREN'S ASPIRIN 81 MG CHEWABLE TABLET: 81 mg | ORAL | Qty: 1

## 2018-02-27 NOTE — Progress Notes (Signed)
TRANSFER - OUT REPORT:    Verbal report given to brenda (name) on Meghan [name]  being transferred to [facility](unit) for routine progression of care       Report consisted of patient???s Situation, Background, Assessment and   Recommendations(SBAR).     Information from the following report(s) SBAR, Kardex, Recent Results and Med Rec Status was reviewed with the receiving nurse.    Lines:       Opportunity for questions and clarification was provided.      Patient transported with:   Tech

## 2018-02-27 NOTE — Progress Notes (Signed)
Transition of Care Plan to SNF/Rehab    SNF/Rehab Transition:  Patient has been accepted to [facility] and meets criteria for admission.   Patient will transported by [facility] and expected to leave at 4pm.    Communication to Patient/Family:  Met with patient and daughter, Meghan [name] (identified care giver) and they are agreeable to the transition plan.    Communication to SNF/Rehab:  Bedside RN, Keane Scrape, has been notified to update the transition plan to the facility and call report (phone number [phone]).  Discharge information has been updated on the AVS.     Discharge instructions to be fax'd to facility at    SNF/Rehab Transition:  Patient to follow-up with Home Health: [facility],  PCP/Specialist: [place] or [name]  Ambulatory Care Management: [name]    Reviewed and confirmed with facility, Danae Chen they can manage the patient care needs for the following:     [place]ta [place] with (X) only those applicable:    Medication:  '[x]'   Medications will be available at the facility  '[]'   IV Antibiotics  '[x]'   Controlled Substance - hard copy to be sent with patient   '[]'   Weekly Labs   Documents:  '[x]'  Hard RX  '[]'  MAR  '[]'  Kardex  '[x]'  AVS  '[]' Transfer Summary  '[x]' Discharge   Equipment:  '[]'   CPAP/BiPAP  '[]'   Wound Vacuum  '[]'   Foley or Urinary Device  '[]'   PICC/Central Line  '[]'   Nebulizer  '[]'   Ventilator   Treatment:  '[]' Isolation (for MRSA, VRE, etc.)  '[]' Surgical Drain Management  '[]' Tracheostomy Care  '[]' Dressing Changes  '[]' Dialysis with transportation and chair time   '[]' PEG Care  '[]' Oxygen  '[]' Daily Weights for Heart Failure   Dietary:  '[]' Any diet limitations  '[]' Tube Feedings   '[]' Total Parenteral Management (TPN)   Eligible for Medicaid Long Term Services and Supports  Yes:  '[x]'  Eligible for medical assistance or will become eligible within 180 days and UAI completed.   '[]'  Provider/Patient and/or support system has requested screening.  '[x]'  UAI copy provided to patient or responsible party   '[]'  UAI unavailable at discharge will send once processed to SNF provider.  '[]'  UAI unavailable at discharged mailed to patient  No:   '[]'  Private pay and is not financially eligible for Medicaid within the next 180 days.  '[]'  Reside out-of-state.  '[]'  A residents of a state owned/operated facility that is licensed  by [employer] [place] and [employer] [employer] or [place]  '[]'  Enrollment in [place] hospice services  '[]'  Non [place] citizen  '[]'  Patient /Family declines to have screening completed or provide financial information for screening     Financial Resources:  Medicaid    '[]'  Initiated and application pending   '[x]'  Full coverage     Advanced Care Plan:  '[x]' Surrogate Decision Maker of Care  '[]' POA  '[x]' Communicated Code Status Full    Other  Pt UAI was completed.   [facility] staff informed that pt has been prescribed Gabapentin. Hard Script will be sent with pt.    Dorothea Glassman, Care Manager  [phone]

## 2018-02-27 NOTE — Progress Notes (Signed)
Attempted to call and give report> this nurse waited for 10 minutes on hold. This nurse called back and left name and number for receiving nurse to call me when available

## 2018-02-27 NOTE — Discharge Summary (Signed)
Hospitalist Discharge Summary     Patient ID:  Meghan [name]  [id number]  72 y.o.  04/14/1946  02/23/2018    PCP on record: Gwynn Burly, NP    Admit date: 02/23/2018  Discharge date and time: 02/27/2018    DISCHARGE DIAGNOSIS:      Acute metabolic encephalopathy with a aphasia, present on admission, likely 2nd to poor medication management at home  History of seizure disorder  Urinary tract infection and stroke ruled out  History of advanced dementia  History of psychosis  Hypertension  Hyperlipidemia  Deconditioning  Overweight status with a BMI of 27        CONSULTATIONS:  IP CONSULT TO HOSPITALIST  IP CONSULT TO NEUROLOGY  IP CONSULT TO NEUROLOGY    Excerpted HPI from H&P of Meghan Lajuana Matte, MD:  CHIEF COMPLAINT: aphasia   ??  HISTORY OF PRESENT ILLNESS:     72 years old female from??home with past medical history significant for hypertension, seizure, DM, psychotic disorder presented to the hospital for evaluation of change mental status associated with aphasia, patient had multiple admission for to the hospital for the same complaint last time she was admitted on May and was discharged on May 23 for acute encephalopathy, had a CT scan was done and show no significant abnormality, blood work was done and show no significant abnormality.  ??  We were asked to admit for work up and evaluation of the above problems.   ??    ______________________________________________________________________  DISCHARGE SUMMARY/HOSPITAL COURSE:  for full details see H&P, daily progress notes, labs, consult notes.   Acute metabolic encephalopathy w/ aphasia, POA  R/o seizure versus CVA  R/o UTI  Hx of dementia, appears to be fairly advacned  Hx of psychosis and sz do  ???Admited patient to telemetry unit  ???Continue seizure medication  ???Seizure precautions  -Patient was evaluated by neurology, who feels that her symptoms may be related to poor medication management   -MRI was negative   -EEG c/w encephalopathy,  No sz  -Echocardiogram normal  -Last carotid ultrasound was neg in 8/18  -Continue aspirin, add statin  -Hemoglobin A1c 5.4 and lLDL 132, TSH was normal in May  -Urine looks slightly abnormal with small leukocyte esterase few epithelial cells 10-20 white blood cells but no bacteria.  She has a normal white count, no fever although is a poor historian does not seem to have any suprapubic tenderness.  Will hold off on treatment for possible urinary tract infection and follow-up culture shows 30K mixed flora, likely contaminated specimen, rather than UTI, consider antibiotics if she spikes a fever or develops any symptoms  -PT/OT/ST eval  -daughter does not feels pt is "quite baseline", sister reported she has significant "dementia"  -neuro feels she is baseline, based on prior admissions  -care management to see for discharge planning for home health and medication management and therapy vs SNF, daughter wants SNF...apparently lives alone with some help from daughter and sister living nearby  -awaiting insurance auth for dc to SNF- obtained this afternoon, will dc to [facility]    Hypertension  HL  ???Continue home antihypertensive medication  -add statin    History of Dementia??  ???Continue Aricept    ??  Recommended Disposition:     Patient was admitted with expressive a aphasia altered mentation she has had multiple admissions for similar complaints felt to be an encephalopathy.  During this admission patient was restarted on her home medications including her seizure  meds.  She was seen by neurology.  Her MRI of the brain was negative.  EEG showed no seizure activity but consistent with encephalopathy.  Echocardiogram was normal.  Neurology recommended felt her AMS was 2nd to poor medication management and recs for continuing aspirin and adding a statin for her LDL of 132.  She had no evidence of a urinary tract infection cultures grew mixed flora she had no fevers her white count.     Per neurology patient was essentially at her baseline and has significant dementia and cleared her for discharge.    Patient apparently lives alone at home with support from family that lives nearby.  However she cannot care for herself and her state.  She was seen by case management and arrangements were made for rehab and then may require some long-term care.    Patient has been accepted to [facility] for rehab.  She will be discharged there this afternoon.  She will need to follow-up with the attending at [facility] in the next 3 days and her primary care physician after discharge from the facility.  She will need to follow-up with neurology in 2 weeks.  She will need close monitoring of her medications.          _______________________________________________________________________  Patient seen and examined by me on discharge day.  Pertinent Findings:  Patient remains confused and repeating questions that I asked her.  She does not appear toxic and is trying to eat her lunch on her own.  Mucous membranes are moist.  Cardiovascular regular rate lungs are clear abdomen is benign extremities no clubbing cyanosis or edema is able to move all extremities and is following commands.  _______________________________________________________________________  DISCHARGE MEDICATIONS:   Current Discharge Medication List      START taking these medications    Details   atorvastatin (LIPITOR) 40 mg tablet Take 1 Tab by mouth nightly for 30 days.  Qty: 30 Tab, Refills: 0         CONTINUE these medications which have CHANGED    Details   gabapentin (NEURONTIN) 100 mg capsule Take 1 Cap by mouth three (3) times daily for 3 days. Max Daily Amount: 300 mg. Take one tab by mouth 3 times a day  Qty: 10 Cap, Refills: 0    Associated Diagnoses: Seizure (HCC); Memory difficulty; Poor short term memory; Neuropathy         CONTINUE these medications which have NOT CHANGED    Details   topiramate (TOPAMAX) 50 mg tablet TK 1 T PO BID   Refills: 2      citalopram (CELEXA) 20 mg tablet Take 1 Tab by mouth daily.  Qty: 90 Tab, Refills: 3    Associated Diagnoses: Depression, unspecified depression type      divalproex DR (DEPAKOTE) 500 mg tablet TAKE 1 TABLET BY MOUTH TWICE DAILY  Qty: 60 Tab, Refills: 0    Associated Diagnoses: Seizure (HCC)      lacosamide (VIMPAT) 100 mg tab tablet TAKE 1 TABLET BY MOUTH TWICE DAILY  Qty: 60 Tab, Refills: 1    Associated Diagnoses: Seizure (HCC)      fenofibrate (LOFIBRA) 54 mg tablet Take 1 Tab by mouth nightly.  Qty: 90 Tab, Refills: 3    Associated Diagnoses: Mixed hyperlipidemia      donepezil (ARICEPT) 10 mg tablet TAKE 1 TABLET BY MOUTH EVERY NIGHT  Qty: 30 Tab, Refills: 0      hydrALAZINE (APRESOLINE) 25 mg tablet Take 1 Tab by  mouth three (3) times daily as needed (systolic blood pressure greater than 170 or diastolic blood pressure greater than 110).  Qty: 30 Tab, Refills: 0      cholecalciferol (VITAMIN D3) 1,000 unit cap take 1 capsule by mouth once daily  Qty: 30 Cap, Refills: 3    Associated Diagnoses: Vitamin D deficiency      fluticasone (FLONASE) 50 mcg/actuation nasal spray 1 Spray by Both Nostrils route daily.  Qty: 1 Bottle, Refills: 1    Associated Diagnoses: Allergic rhinitis, unspecified seasonality, unspecified trigger      aspirin 81 mg chewable tablet Take 81 mg by mouth daily.         STOP taking these medications       lisinopril (PRINIVIL, ZESTRIL) 10 mg tablet Comments:   Reason for Stopping:                 Patient Follow Up Instructions:     Follow-up Information     Follow up With Specialties Details Why Contact Info    Aralu, Cletus C, MD Neurology Go on 03/13/2018 at 8:40 as previously scheduled [address]  [address] [place]  [place] [id number]  [phone]      Gwynn BurlySeabrook, Berna A, NP Nurse Practitioner Go on 03/11/2018 Please follow up on March 11, 2018 at 10:15 [address]  [place] [id number]  [phone]       Gwynn BurlySeabrook, Berna A, NP Nurse Practitioner In 3 days  [address]  [place] [id number]  [phone]      Tobi BastosGatuslao, Hernan T Jr., MD Neurology In 2 weeks  [address]  [address]  [place] [id number]  [phone]          ________________________________________________________________    Risk of deterioration: High    Condition at Discharge:  Stable  __________________________________________________________________    Disposition  SNF/LTC    ____________________________________________________________________    Code Status: Full Code  ___________________________________________________________________      Total time in minutes spent coordinating this discharge (includes going over instructions, follow-up, prescriptions, and preparing report for sign off to her PCP) :  40 minutes    Signed:  Christiana PellantNoemi G Jaimie Pippins, MD
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
## 2018-02-27 NOTE — Progress Notes (Addendum)
TRANSITION OF CARE PLAN:     Plan A: d/c to [facility] (SNF) today at 4pm. Transport is running late and expects to arrive at 5pm.     [facility] Report: [phone]    CM to arrange for morning d/c for pt to [facility]. [facility] to transport at 9am. Pt daughter is in agreement with this plan.     UPDATE 1:42PM  CM to complete PCS form for pt transport tomorrow.     UPDATE 2:53PM  [facility] will transport pt at 4pm today. [facility] informed. [facility] is needed for pt Gabapentin. [facility] is aware and physician has been informed. Pt nurse has been informed of pt discharge and transport as well. PCS form placed in pt chart. Pt daughter informed of discharge and discharge time.     [facility] Report: [phone]    UPDATE 4:18PM    [facility] contacted CM and informed that they were running behind. They expect to arrive at 5pm.     Care Management has completed the discharge planning needs of the patient at this time.   Care Management Interventions  PCP Verified by CM: Yes  Palliative Care Criteria Met (RRAT>21 & CHF Dx)?: No  Mode of Transport at Discharge: BLS  Transition of Care Consult (CM Consult): SNF  Partner SNF: Yes  Discharge Durable Medical Equipment: No  Physical Therapy Consult: Yes  Occupational Therapy Consult: Yes  Current Support Network: Family Lives Nearby  Confirm Follow Up Transport: Other (see comment)  Plan discussed with Pt/Family/Caregiver: Yes  Freedom of Choice Offered: Yes  Discharge Location  Discharge Placement: Skilled nursing facility        Jamila Goodall, Care Manager  [phone]

## 2018-02-27 NOTE — Progress Notes (Signed)
Attempted to call and give report> this nurse waited for 10 minutes on hold. This nurse called back and left name and number for receiving nurse to call me when available

## 2018-02-27 NOTE — Discharge Summary (Signed)
Discharge Summary by Meghan Pellantomano, Isamu Trammel G, MD at 02/27/18 1507                Author: Christiana Pellantomano, Nautia Lem G, MD  Service: Internal Medicine  Author Type: Physician       Filed: 02/27/18 1523  Date of Service: 02/27/18 1507  Status: Signed          Editor: Meghan Pellantomano, Cynia Abruzzo G, MD (Physician)                                          Hospitalist Discharge Summary        Patient ID:   Meghan [name]   [id number]   72 y.o.   1945/08/31   02/23/2018      PCP on record: Gwynn BurlySeabrook, Berna A, NP      Admit date: 02/23/2018   Discharge date and time: 02/27/2018      DISCHARGE DIAGNOSIS:         Acute metabolic encephalopathy with a aphasia, present on admission, likely 2nd to poor medication management at home   History of seizure disorder   Urinary tract infection and stroke ruled out   History of advanced dementia   History of psychosis   Hypertension   Hyperlipidemia   Deconditioning   Overweight status with a BMI of 27            CONSULTATIONS:   IP CONSULT TO HOSPITALIST   IP CONSULT TO NEUROLOGY   IP CONSULT TO NEUROLOGY      Excerpted HPI from H&P of Ahmed Lajuana MatteM Hassan, MD:   CHIEF COMPLAINT: aphasia    ??   HISTORY OF PRESENT ILLNESS:      72 years old female from??home with past medical history significant for hypertension, seizure, DM, psychotic disorder presented to the hospital for evaluation of change mental status associated with aphasia, patient had multiple admission for to the  hospital for the same complaint last time she was admitted on May and was discharged on May 23 for acute encephalopathy, had a CT scan was done and show no significant abnormality, blood work was done and show no significant abnormality.   ??   We were asked to admit for work up and evaluation of the above problems.    ??      ______________________________________________________________________   DISCHARGE SUMMARY/HOSPITAL COURSE:   for full details see H&P, daily progress notes, labs, consult notes.    Acute metabolic encephalopathy w/ aphasia, POA    R/o seizure versus CVA   R/o UTI   Hx of dementia, appears to be fairly advacned   Hx of psychosis and sz do  -Admited patient to telemetry unit  -Continue seizure medication  -Seizure precautions   -Patient was evaluated by neurology, who feels that her symptoms may be related to poor medication management    -MRI was negative   -EEG c/w encephalopathy,  No sz   -Echocardiogram normal   -Last carotid ultrasound was neg in 8/18   -Continue aspirin, add statin   -Hemoglobin A1c 5.4 and lLDL 132, TSH was normal in May   -Urine looks slightly abnormal with small leukocyte esterase few epithelial cells 10-20 white blood cells but no bacteria.  She has a normal white count, no fever although is a poor historian does not seem to have any suprapubic tenderness.  Will hold  off on treatment for  possible urinary tract infection and follow-up culture shows 30K mixed flora, likely contaminated specimen, rather than UTI, consider antibiotics if she spikes a fever or develops any symptoms   -PT/OT/ST eval   -daughter does not feels pt is "quite baseline", sister reported she has significant "dementia"   -neuro feels she is baseline, based on prior admissions   -care management to see for discharge planning for home health and medication management and therapy vs SNF, daughter wants SNF...apparently lives alone with some help from daughter and sister living nearby   -awaiting insurance auth for dc to SNF- obtained this afternoon, will dc to [facility]     Hypertension   HL  -Continue home antihypertensive medication   -add statin    History of Dementia??  -Continue Aricept      ??   Recommended Disposition:       Patient was admitted with expressive a aphasia altered mentation she has had multiple admissions for similar complaints felt to be an encephalopathy.  During this admission patient was restarted on her home medications including her seizure meds.  She  was seen by neurology.  Her MRI of the brain was negative.  EEG showed no  seizure activity but consistent with encephalopathy.  Echocardiogram was normal.  Neurology recommended felt her AMS was 2nd to poor medication management and recs for continuing  aspirin and adding a statin for her LDL of 132.  She had no evidence of a urinary tract infection cultures grew mixed flora she had no fevers her white count.      Per neurology patient was essentially at her baseline and has significant dementia and cleared her for discharge.      Patient apparently lives alone at home with support from family that lives nearby.  However she cannot care for herself and her state.  She was seen by case management and arrangements were made for rehab and then may require some long-term care.      Patient has been accepted to [facility] for rehab.  She will be discharged there this afternoon.  She will need to follow-up with the attending at [facility] in the next 3 days and her primary care physician after discharge from the facility.  She will need  to follow-up with neurology in 2 weeks.  She will need close monitoring of her medications.               _______________________________________________________________________   Patient seen and examined by me on discharge day.   Pertinent Findings:   Patient remains confused and repeating questions that I asked her.  She does not appear toxic and is trying to eat her lunch on her own.  Mucous membranes are moist.  Cardiovascular  regular rate lungs are clear abdomen is benign extremities no clubbing cyanosis or edema is able to move all extremities and is following commands.   _______________________________________________________________________   DISCHARGE MEDICATIONS:      Current Discharge Medication List              START taking these medications          Details        atorvastatin (LIPITOR) 40 mg tablet  Take 1 Tab by mouth nightly for 30 days.   Qty: 30 Tab, Refills:  0                     CONTINUE these medications which have CHANGED  Details         gabapentin (NEURONTIN) 100 mg capsule  Take 1 Cap by mouth three (3) times daily for 3 days. Max Daily Amount: 300 mg. Take one tab by mouth 3 times a day   Qty: 10 Cap, Refills:  0          Associated Diagnoses: Seizure (HCC); Memory difficulty; Poor short term memory; Neuropathy                     CONTINUE these medications which have NOT CHANGED          Details        topiramate (TOPAMAX) 50 mg tablet  TK 1 T PO BID   Refills: 2               citalopram (CELEXA) 20 mg tablet  Take 1 Tab by mouth daily.   Qty: 90 Tab, Refills:  3          Associated Diagnoses: Depression, unspecified depression type               divalproex DR (DEPAKOTE) 500 mg tablet  TAKE 1 TABLET BY MOUTH TWICE DAILY   Qty: 60 Tab, Refills:  0          Associated Diagnoses: Seizure (HCC)               lacosamide (VIMPAT) 100 mg tab tablet  TAKE 1 TABLET BY MOUTH TWICE DAILY   Qty: 60 Tab, Refills:  1          Associated Diagnoses: Seizure (HCC)               fenofibrate (LOFIBRA) 54 mg tablet  Take 1 Tab by mouth nightly.   Qty: 90 Tab, Refills:  3          Associated Diagnoses: Mixed hyperlipidemia               donepezil (ARICEPT) 10 mg tablet  TAKE 1 TABLET BY MOUTH EVERY NIGHT   Qty: 30 Tab, Refills:  0               hydrALAZINE (APRESOLINE) 25 mg tablet  Take 1 Tab by mouth three (3) times daily as needed (systolic blood pressure greater than 170 or diastolic blood pressure greater than 110).   Qty: 30 Tab, Refills:  0               cholecalciferol (VITAMIN D3) 1,000 unit cap  take 1 capsule by mouth once daily   Qty: 30 Cap, Refills:  3          Associated Diagnoses: Vitamin D deficiency               fluticasone (FLONASE) 50 mcg/actuation nasal spray  1 Spray by Both Nostrils route daily.   Qty: 1 Bottle, Refills:  1          Associated Diagnoses: Allergic rhinitis, unspecified seasonality, unspecified trigger               aspirin 81 mg chewable tablet  Take 81 mg by mouth daily.                     STOP taking these medications                   lisinopril (PRINIVIL, ZESTRIL) 10 mg tablet  Comments:    Reason for Stopping:  Patient Follow Up Instructions:         Follow-up Information               Follow up With  Specialties  Details  Why  Contact Info              Aralu, Cletus C, MD  Neurology  Go on 03/13/2018  at 8:40 as previously scheduled  [address]   [address] [place] [address]   [phone]                 Gwynn Burly, NP  Nurse Practitioner  Go on 03/11/2018  Please follow up on March 11, 2018 at 10:15  [address]   [place] [address]   [phone]          Gwynn Burly, NP  Nurse Practitioner  In 3 days    [address]   [place] [address]   [phone]                 Tobi Bastos., MD  Neurology  In 2 weeks    [address]   [address]   [place] [address]   [phone]                ________________________________________________________________      Risk of deterioration: High      Condition at Discharge:  Stable   __________________________________________________________________      Disposition   SNF/LTC      ____________________________________________________________________      Code Status: Full Code   ___________________________________________________________________         Total time in minutes spent coordinating this discharge (includes going over instructions, follow-up, prescriptions, and preparing report for sign off to her PCP) :  40 minutes      Signed:   Christiana Pellant, MD

## 2018-02-27 NOTE — Progress Notes (Signed)
TRANSITION OF CARE PLAN:     Plan A: d/c to [facility] (SNF) today at [place] is running late and expects to arrive at [place] Report: [phone]    CM to arrange for morning d/c for pt to [facility]. [facility] to transport at 9am. Pt daughter is in agreement with this plan.     UPDATE 1:42PM  CM to complete PCS form for pt transport tomorrow.     UPDATE 2:53PM  [facility] will transport pt at 4pm today. [facility] informed. Hard Script is needed for pt Gabapentin. [facility] is aware and physician has been informed. Pt nurse has been informed of pt discharge and transport as well. PCS form placed in pt chart. Pt daughter informed of discharge and discharge time.     [facility] Report: [place] 4:18PM    [facility] ambulance contacted CM and informed that they were running behind. They expect to arrive at [place] Management has completed the discharge planning needs of the patient at this time.   Care Management Interventions  PCP Verified by CM: Yes  Palliative Care Criteria Met (RRAT>21 & CHF Dx)?: No  Mode of Transport at Discharge: BLS  Transition of Care Consult (CM Consult): SNF  Partner SNF: Yes  Discharge Durable Medical Equipment: No  Physical Therapy Consult: Yes  Occupational Therapy Consult: Yes  Current Support Network: Family Lives Nearby  Confirm Follow Up Transport: Other (see comment)  Plan discussed with Pt/Family/Caregiver: Yes  Freedom of Choice Offered: Yes  Discharge Location  Discharge Placement: Skilled nursing facility        [place], Care Manager  [phone]

## 2018-02-27 NOTE — Progress Notes (Signed)
Transition of Care Plan to SNF/Rehab    SNF/Rehab Transition:  Patient has been accepted to [facility] and Rehab and meets criteria for admission.   Patient will transported by [facility] and expected to leave at 4pm.    Communication to Patient/Family:  Met with patient and daughter, Verdene Lennert (identified care giver) and they are agreeable to the transition plan.    Communication to SNF/Rehab:  Bedside RN, Keane Scrape, has been notified to update the transition plan to the facility and call report (phone number [phone]).  Discharge information has been updated on the AVS.     Discharge instructions to be fax'd to facility at    SNF/Rehab Transition:  Patient to follow-up with Home Health: [facility],  PCP/Specialist: [place] or [name]  Ambulatory Care Management: [name]    Reviewed and confirmed with facility, Danae Chen they can manage the patient care needs for the following:     [place] with (X) only those applicable:    Medication:  '[x]'   Medications will be available at the facility  '[]'   IV Antibiotics  '[x]'   Controlled Substance - hard copy to be sent with patient   '[]'   Weekly Labs   Documents:  '[x]'  Hard RX  '[]'  MAR  '[]'  Kardex  '[x]'  AVS  '[]' Transfer Summary  '[x]' Discharge   Equipment:  '[]'   CPAP/BiPAP  '[]'   Wound Vacuum  '[]'   Foley or Urinary Device  '[]'   PICC/Central Line  '[]'   Nebulizer  '[]'   Ventilator   Treatment:  '[]' Isolation (for MRSA, VRE, etc.)  '[]' Surgical Drain Management  '[]' Tracheostomy Care  '[]' Dressing Changes  '[]' Dialysis with transportation and chair time   '[]' PEG Care  '[]' Oxygen  '[]' Daily Weights for Heart Failure   Dietary:  '[]' Any diet limitations  '[]' Tube Feedings   '[]' Total Parenteral Management (TPN)   Eligible for Medicaid Long Term Services and Supports  Yes:  '[x]'  Eligible for medical assistance or will become eligible within 180 days and UAI completed.   '[]'  Provider/Patient and/or support system has requested screening.  '[x]'  UAI copy provided to patient or responsible party  '[]'  UAI unavailable at  discharge will send once processed to SNF provider.  '[]'  UAI unavailable at discharged mailed to patient  No:   '[]'  Private pay and is not financially eligible for Medicaid within the next 180 days.  '[]'  Reside out-of-state.  '[]'  A residents of a state owned/operated facility that is licensed  by Department of [name] and Developmental Services or [place]  '[]'  Enrollment in [place] hospice services  '[]'  Non [place] citizen  '[]'  Patient /Family declines to have screening completed or provide financial information for screening     Financial Resources:  Medicaid    '[]'  Initiated and application pending   '[x]'  Full coverage     Advanced Care Plan:  '[x]' Surrogate Decision Maker of Care  '[]' POA  '[x]' Communicated Code Status Full    Other  Pt UAI was completed.   [facility] staff informed that pt has been prescribed Gabapentin. Hard Script will be sent with pt.    Dorothea Glassman, Care Manager  [phone]

## 2018-02-27 NOTE — Progress Notes (Signed)
 TRANSFER - OUT REPORT:    Verbal report given to brenda (name) on Meghan [name]  being transferred to [facility](unit) for routine progression of care       Report consisted of patient's Situation, Background, Assessment and   Recommendations(SBAR).     Information from the following report(s) SBAR, Kardex, Recent Results and Med Rec Status was reviewed with the receiving nurse.    Lines:       Opportunity for questions and clarification was provided.      Patient transported with:   [employer]

## 2018-03-11 ENCOUNTER — Encounter: Attending: Nurse Practitioner | Primary: Internal Medicine

## 2018-03-13 ENCOUNTER — Ambulatory Visit
Admit: 2018-03-13 | Discharge: 2018-03-13 | Payer: PRIVATE HEALTH INSURANCE | Attending: Neurology | Primary: Internal Medicine

## 2018-03-13 DIAGNOSIS — F0280 Dementia in other diseases classified elsewhere without behavioral disturbance: Secondary | ICD-10-CM

## 2018-03-13 MED ORDER — MEMANTINE 10 MG TAB
10 mg | ORAL_TABLET | ORAL | 2 refills | Status: DC
Start: 2018-03-13 — End: 2018-05-14

## 2018-03-13 MED ORDER — MEMANTINE 10 MG TAB
10 mg | ORAL_TABLET | Freq: Two times a day (BID) | ORAL | 2 refills | Status: DC
Start: 2018-03-13 — End: 2018-03-13

## 2018-03-13 MED ORDER — METHYLPHENIDATE 5 MG TAB
5 mg | ORAL_TABLET | Freq: Two times a day (BID) | ORAL | 0 refills | Status: DC
Start: 2018-03-13 — End: 2018-04-03

## 2018-03-13 NOTE — Progress Notes (Signed)
Neurology Progress Note    NAME:  Meghan [name]   DOB:   03-01-1946   MRN:   [id number]     Date/Time:  03/13/2018  Subjective:   Meghan [name] is a 72 y.o. female here today for follow-up for seizure disorder, memory difficulty, neuropathy, pain confusion.  Patient was accompanied by her sister to the visit.  Patient was recently discharged from the hospital for altered mental status, no overt seizure was reported.  Reviewed hospital discharge note, EEG obtained hospital was consistent with encephalopathy, CT scan was unremarkable for acute stroke.  She was noted to have UTI.  Since discharge from the hospital, patient had been slow, more or less mute, when patient says something she continues to repeat it.  Patient did visit, patient was not consistently following  command.  Sister says she is still complaining of headache.  Patient dementia seems to be progressing, and this is complicated with psychiatric under tone, as she appears to be mute than aphasic.  Patient's sister says patient sleeps most of the time, and appears very tired.  As stated before, this patient needs to be in a home i.e. nursing home as she needs 24-hour attendant, she cannot take care of herself anymore otherwise she will continue to go in and out of the hospital.  Her primary care physician, case manager she will be established to follow with this patient's case.  This can be done also as patient is still in the skilled nursing facility.  It is not advisable to discharge this patient to home from the skilled nursing facility.  Patient sister was patient answering most of the questions during the review of system.  Review of Systems - General ROS: positive for  - fatigue and sleep disturbance  Psychological ROS: positive for - anxiety, concentration difficulties, memory difficulties and sleep disturbances  Ophthalmic ROS: positive for - blurry vision and photophobia   ENT ROS: positive for - headaches, tinnitus, vertigo and visual changes  Allergy and Immunology ROS: negative  Hematological and Lymphatic ROS: negative  Endocrine ROS: negative  Respiratory ROS: no cough, shortness of breath, or wheezing  Cardiovascular ROS: no chest pain or dyspnea on exertion  Gastrointestinal ROS: no abdominal pain, change in bowel habits, or black or bloody stools  Genito-Urinary ROS: no dysuria, trouble voiding, or hematuria  Musculoskeletal ROS: positive for - gait disturbance, joint pain, joint stiffness, muscle pain and muscular weakness  Neurological ROS: positive for - dizziness, gait disturbance, headaches, impaired coordination/balance, numbness/tingling, seizures, tremors, visual changes and weakness  Dermatological ROS: negative    Medications reviewed:  Current Outpatient Medications   Medication Sig Dispense Refill   ??? methylphenidate HCl (RITALIN) 5 mg tablet Take 1 Tab by mouth two (2) times a day. Max Daily Amount: 10 mg. 60 Tab 0   ??? memantine (NAMENDA) 10 mg tablet Take 1 Tab by mouth two (2) times a day. 60 Tab 2   ??? divalproex DR (DEPAKOTE) 500 mg tablet TAKE 1 TABLET BY MOUTH TWICE DAILY 180 Tab 0   ??? atorvastatin (LIPITOR) 40 mg tablet Take 1 Tab by mouth nightly for 30 days. 30 Tab 0   ??? topiramate (TOPAMAX) 50 mg tablet TK 1 T PO BID  2   ??? citalopram (CELEXA) 20 mg tablet Take 1 Tab by mouth daily. 90 Tab 3   ??? lacosamide (VIMPAT) 100 mg tab tablet TAKE 1 TABLET BY MOUTH TWICE DAILY 60 Tab 1   ??? fenofibrate (LOFIBRA) 54  mg tablet Take 1 Tab by mouth nightly. 90 Tab 3   ??? donepezil (ARICEPT) 10 mg tablet TAKE 1 TABLET BY MOUTH EVERY NIGHT 30 Tab 0   ??? hydrALAZINE (APRESOLINE) 25 mg tablet Take 1 Tab by mouth three (3) times daily as needed (systolic blood pressure greater than 170 or diastolic blood pressure greater than 110). 30 Tab 0   ??? cholecalciferol (VITAMIN D3) 1,000 unit cap take 1 capsule by mouth once daily 30 Cap 3    ??? fluticasone (FLONASE) 50 mcg/actuation nasal spray 1 Spray by Both Nostrils route daily. 1 Bottle 1   ??? aspirin 81 mg chewable tablet Take 81 mg by mouth daily.          Objective:   Vitals:  Vitals:    03/13/18 0857   BP: 110/71   Pulse: 68   Resp: 16   Temp: 97.7 ??F (36.5 ??C)   TempSrc: Temporal   SpO2: 97%   Weight: 158 lb 6.4 oz (71.8 kg)   Height: 5\' 5"  (1.651 m)   PainSc:   0 - No pain       Lab Data Reviewed:  Lab Results   Component Value Date/Time    WBC 4.7 02/23/2018 04:42 PM    HCT 42.4 02/23/2018 04:42 PM    HGB 13.6 02/23/2018 04:42 PM    PLATELET 472 (H) 02/23/2018 04:42 PM       Lab Results   Component Value Date/Time    Sodium 138 02/27/2018 04:17 AM    Potassium 3.8 02/27/2018 04:17 AM    Chloride 107 02/27/2018 04:17 AM    CO2 23 02/27/2018 04:17 AM    Glucose 63 (L) 02/27/2018 04:17 AM    BUN 8 02/27/2018 04:17 AM    Creatinine 0.92 02/27/2018 04:17 AM    Calcium 8.9 02/27/2018 04:17 AM       No components found for: TROPQUANT    No results found for: ANA      Lab Results   Component Value Date/Time    Hemoglobin A1c 5.4 02/25/2018 03:49 AM    Hemoglobin A1c (POC) 5.4 01/08/2018 10:09 AM        Lab Results   Component Value Date/Time    Vitamin B12 1,276 (H) 01/02/2018 10:30 AM    Folate 32.1 (H) 01/02/2018 10:30 AM       No results found for: ANA, Everlean Alstrom, XBANA    Lab Results   Component Value Date/Time    Cholesterol, total 198 02/25/2018 03:49 AM    Cholesterol (POC) 169 01/08/2018 10:10 AM    HDL Cholesterol 48 02/25/2018 03:49 AM    HDL Cholesterol (POC) 45 01/08/2018 10:10 AM    LDL Cholesterol (POC) 108 16/05/9603 10:10 AM    LDL, calculated 132 (H) 02/25/2018 03:49 AM    VLDL, calculated 18 02/25/2018 03:49 AM    Triglyceride 90 02/25/2018 03:49 AM    Triglycerides (POC) 80 01/08/2018 10:10 AM    CHOL/HDL Ratio 4.1 02/25/2018 03:49 AM         CT Results (recent):  Results from Hospital Encounter encounter on 02/23/18   CT HEAD WO CONT    Narrative EXAM: CT HEAD WO CONT     INDICATION: AMS, expressive aphasia    COMPARISON: Dec 31, 2017    CONTRAST: None.    TECHNIQUE: Unenhanced CT of the head was performed using 5 mm images. Brain and  bone windows were generated.  CT dose reduction was achieved through use  of a  standardized protocol tailored for this examination and automatic exposure  control for dose modulation.      FINDINGS:  There is no extra-axial fluid collection hemorrhage shift or masses. Sella is  unchanged empty or partially empty.      Impression impression: No acute changes.       MRI Results (recent):  Results from Hospital Encounter encounter on 02/23/18   MRI BRAIN WO CONT    Narrative EXAM: MRI BRAIN WO CONT    INDICATION: aphasia    COMPARISON: MRI 01/02/2018, CT 02/23/2018.    CONTRAST: None.    TECHNIQUE:    Multiplanar multisequence acquisition without contrast of the brain.    FINDINGS:  The ventricles are normal in size and position. There is no acute infarct,  hemorrhage, extra-axial fluid collection, or mass effect. There is no cerebellar  tonsillar herniation. Expected arterial flow-voids are present. An empty sella  appearance is again shown.    The paranasal sinuses, mastoid air cells, and middle ears are clear. The orbital  contents are within normal limits. No significant osseous or scalp lesions are  identified.      Impression IMPRESSION:   No acute findings.       IR Results (recent):  No results found for this or any previous visit.    VAS/US Results (recent):  Results from Hospital Encounter encounter on 03/22/17   DUPLEX CAROTID BILATERAL       PHYSICAL EXAM:  General:    Alert, cooperative, no distress, appears stated age.     Head:   Normocephalic, without obvious abnormality, atraumatic.  Eyes:   Conjunctivae/corneas clear.  PERRLA  Nose:  Nares normal. No drainage or sinus tenderness.  Throat:    Lips, mucosa, and tongue normal.  No Thrush  Neck:  Supple, symmetrical,  no adenopathy, thyroid: non tender    no carotid bruit and no JVD.   Back:    Symmetric,  No CVA tenderness.  Lungs:   Clear to auscultation bilaterally.  No Wheezing or Rhonchi. No rales.  Chest wall:  No tenderness or deformity. No Accessory muscle use.  Heart:   Regular rate and rhythm,  no murmur, rub or gallop.  Abdomen:   Soft, non-tender. Not distended.  Bowel sounds normal. No masses  Extremities: Extremities normal, atraumatic, No cyanosis.  No edema. No clubbing  Skin:     Texture, turgor normal. No rashes or lesions.  Not Jaundiced  Lymph nodes: Cervical, supraclavicular normal.  Psych: Confused      NEUROLOGICAL EXAM:  Appearance:  The patient is well developed, well nourished,  and is in no acute distress.   Mental Status: Oriented to  person. Mood and affect flat.   Cranial Nerves:   Intact visual fields. Fundi are benign. PERLA, EOM's full, no nystagmus, no ptosis. Facial sensation is normal. Corneal reflexes are intact. Facial movement is symmetric. Hearing is normal bilaterally. Palate is midline with normal sternocleidomastoid and trapezius muscles are normal. Tongue is midline.   Motor:  5-/5 strength in upper and lower proximal and distal muscles. Normal bulk and tone. No fasciculations.   Reflexes:   Deep tendon reflexes 2+/4 and symmetrical.   Sensory:    Equivocal sensation to touch, pinprick and vibration.   Gait:   Unsteady gait.   Tremor:    Mild tremor noted.   Cerebellar:  No cerebellar signs present.   Neurovascular:  Normal heart sounds and regular rhythm, peripheral pulses intact, and no carotid bruits.  Assesment  1. Late onset Alzheimer's disease without behavioral disturbance    - REFERRAL TO HOME HEALTH    2. Seizure (HCC)  Stable    3. Complex partial seizure evolving to generalized seizure (HCC)  Stable    4. Encephalopathy  Stable  - REFERRAL TO HOME HEALTH    5. Hypersomnia    - methylphenidate HCl (RITALIN) 5 mg tablet; Take 1 Tab by mouth two (2) times a day. Max Daily Amount: 10 mg.  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 Max Daily Amount: 10 mg.  Dispense: 60 Tab; Refill: 0    ___________________________________________________  PLAN:      ICD-10-CM ICD-9-CM    1. Late onset Alzheimer's disease without behavioral disturbance G30.1 331.0 REFERRAL TO HOME HEALTH    F02.80 294.10    2. Seizure (HCC) R56.9 780.39    3. Complex partial seizure evolving to generalized seizure (HCC) G40.209 345.40    4. Encephalopathy G93.40 348.30 REFERRAL TO HOME HEALTH   5. 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 Return in about 2 months (around 05/13/2018).               ___________________________________________________    Attending Physician: Forrestine Himletus C Sumaya Riedesel, MD

## 2018-03-13 NOTE — Progress Notes (Signed)
Chief Complaint   Patient presents with   ??? Seizure     1. Have you been to the ER, urgent care clinic since your last visit?  Hospitalized since your last visit? [facility] 02/23/18    2. Have you seen or consulted any other health care providers outside of the [facility] since your last visit?  Include any pap smears or colon screening. No

## 2018-03-13 NOTE — Progress Notes (Signed)
Neurology Progress Note    NAME:  Meghan [name]   DOB:   [birth year]   MRN:   [id number]     Date/Time:  03/13/2018  Subjective:   Meghan [name] is a 72 y.o. female here today for follow-up for seizure disorder, memory difficulty, neuropathy, pain confusion.  Patient was accompanied by her sister to the visit.  Patient was recently discharged from the hospital for altered mental status, no overt seizure was reported.  Reviewed hospital discharge note, EEG obtained hospital was consistent with encephalopathy, CT scan was unremarkable for acute stroke.  She was noted to have UTI.  Since discharge from the hospital, patient had been slow, more or less mute, when patient says something she continues to repeat it.  Patient did visit, patient was not consistently following  command.  Sister says she is still complaining of headache.  Patient dementia seems to be progressing, and this is complicated with psychiatric under tone, as she appears to be mute than aphasic.  Patient's sister says patient sleeps most of the time, and appears very tired.  As stated before, this patient needs to be in a home i.e. nursing home as she needs 24-hour attendant, she cannot take care of herself anymore otherwise she will continue to go in and out of the hospital.  Her primary care physician, case manager she will be established to follow with this patient's case.  This can be done also as patient is still in the skilled nursing facility.  It is not advisable to discharge this patient to home from the skilled nursing facility.  Patient sister was patient answering most of the questions during the review of system.  Review of Systems - General ROS: positive for  - fatigue and sleep disturbance  Psychological ROS: positive for - anxiety, concentration difficulties, memory difficulties and sleep disturbances  Ophthalmic ROS: positive for - blurry vision and photophobia  ENT ROS: positive for - headaches, tinnitus, vertigo and visual  changes  Allergy and Immunology ROS: negative  Hematological and Lymphatic ROS: negative  Endocrine ROS: negative  Respiratory ROS: no cough, shortness of breath, or wheezing  Cardiovascular ROS: no chest pain or dyspnea on exertion  Gastrointestinal ROS: no abdominal pain, change in bowel habits, or black or bloody stools  Genito-Urinary ROS: no dysuria, trouble voiding, or hematuria  Musculoskeletal ROS: positive for - gait disturbance, joint pain, joint stiffness, muscle pain and muscular weakness  Neurological ROS: positive for - dizziness, gait disturbance, headaches, impaired coordination/balance, numbness/tingling, seizures, tremors, visual changes and weakness  Dermatological ROS: negative    Medications reviewed:  Current Outpatient Medications   Medication Sig Dispense Refill   ??? methylphenidate HCl (RITALIN) 5 mg tablet Take 1 Tab by mouth two (2) times a day. Max Daily Amount: 10 mg. 60 Tab 0   ??? memantine (NAMENDA) 10 mg tablet Take 1 Tab by mouth two (2) times a day. 60 Tab 2   ??? divalproex DR (DEPAKOTE) 500 mg tablet TAKE 1 TABLET BY MOUTH TWICE DAILY 180 Tab 0   ??? atorvastatin (LIPITOR) 40 mg tablet Take 1 Tab by mouth nightly for 30 days. 30 Tab 0   ??? topiramate (TOPAMAX) 50 mg tablet TK 1 T PO BID  2   ??? citalopram (CELEXA) 20 mg tablet Take 1 Tab by mouth daily. 90 Tab 3   ??? lacosamide (VIMPAT) 100 mg tab tablet TAKE 1 TABLET BY MOUTH TWICE DAILY 60 Tab 1   ??? fenofibrate (LOFIBRA) 54  mg tablet Take 1 Tab by mouth nightly. 90 Tab 3   ??? donepezil (ARICEPT) 10 mg tablet TAKE 1 TABLET BY MOUTH EVERY NIGHT 30 Tab 0   ??? hydrALAZINE (APRESOLINE) 25 mg tablet Take 1 Tab by mouth three (3) times daily as needed (systolic blood pressure greater than 170 or diastolic blood pressure greater than 110). 30 Tab 0   ??? cholecalciferol (VITAMIN D3) 1,000 unit cap take 1 capsule by mouth once daily 30 Cap 3   ??? fluticasone (FLONASE) 50 mcg/actuation nasal spray 1 Spray by Both Nostrils route daily. 1 Bottle 1   ???  aspirin 81 mg chewable tablet Take 81 mg by mouth daily.          Objective:   Vitals:  Vitals:    03/13/18 0857   BP: 110/71   Pulse: 68   Resp: 16   Temp: 97.7 ??F (36.5 ??C)   TempSrc: Temporal   SpO2: 97%   Weight: 158 lb 6.4 oz (71.8 kg)   Height: 5\' 5"  (1.651 m)   PainSc:   0 - No pain       Lab Data Reviewed:  Lab Results   Component Value Date/Time    WBC 4.7 02/23/2018 04:42 PM    HCT 42.4 02/23/2018 04:42 PM    HGB 13.6 02/23/2018 04:42 PM    PLATELET 472 (H) 02/23/2018 04:42 PM       Lab Results   Component Value Date/Time    Sodium 138 02/27/2018 04:17 AM    Potassium 3.8 02/27/2018 04:17 AM    Chloride 107 02/27/2018 04:17 AM    CO2 23 02/27/2018 04:17 AM    Glucose 63 (L) 02/27/2018 04:17 AM    BUN 8 02/27/2018 04:17 AM    Creatinine 0.92 02/27/2018 04:17 AM    Calcium 8.9 02/27/2018 04:17 AM       No components found for: TROPQUANT    No results found for: ANA      Lab Results   Component Value Date/Time    Hemoglobin A1c 5.4 02/25/2018 03:49 AM    Hemoglobin A1c (POC) 5.4 01/08/2018 10:09 AM        Lab Results   Component Value Date/Time    Vitamin B12 1,276 (H) 01/02/2018 10:30 AM    Folate 32.1 (H) 01/02/2018 10:30 AM       No results found for: ANA, Everlean AlstromANARX, ANAIGG, XBANA    Lab Results   Component Value Date/Time    Cholesterol, total 198 02/25/2018 03:49 AM    Cholesterol (POC) 169 01/08/2018 10:10 AM    HDL Cholesterol 48 02/25/2018 03:49 AM    HDL Cholesterol (POC) 45 01/08/2018 10:10 AM    LDL Cholesterol (POC) 108 96/04/540905/28/2019 10:10 AM    LDL, calculated 132 (H) 02/25/2018 03:49 AM    VLDL, calculated 18 02/25/2018 03:49 AM    Triglyceride 90 02/25/2018 03:49 AM    Triglycerides (POC) 80 01/08/2018 10:10 AM    CHOL/HDL Ratio 4.1 02/25/2018 03:49 AM         CT Results (recent):  Results from Hospital Encounter encounter on 02/23/18   CT HEAD WO CONT    Narrative EXAM: CT HEAD WO CONT    INDICATION: AMS, expressive aphasia    COMPARISON: Dec 31, 2017    CONTRAST: None.    TECHNIQUE: Unenhanced CT of  the head was performed using 5 mm images. Brain and  bone windows were generated.  CT dose reduction was achieved through use  of a  standardized protocol tailored for this examination and automatic exposure  control for dose modulation.      FINDINGS:  There is no extra-axial fluid collection hemorrhage shift or masses. Sella is  unchanged empty or partially empty.      Impression impression: No acute changes.       MRI Results (recent):  Results from Hospital Encounter encounter on 02/23/18   MRI BRAIN WO CONT    Narrative EXAM: MRI BRAIN WO CONT    INDICATION: aphasia    COMPARISON: MRI 01/02/2018, CT 02/23/2018.    CONTRAST: None.    TECHNIQUE:    Multiplanar multisequence acquisition without contrast of the brain.    FINDINGS:  The ventricles are normal in size and position. There is no acute infarct,  hemorrhage, extra-axial fluid collection, or mass effect. There is no cerebellar  tonsillar herniation. Expected arterial flow-voids are present. An empty sella  appearance is again shown.    The paranasal sinuses, mastoid air cells, and middle ears are clear. The orbital  contents are within normal limits. No significant osseous or scalp lesions are  identified.      Impression IMPRESSION:   No acute findings.       IR Results (recent):  No results found for this or any previous visit.    VAS/US Results (recent):  Results from Hospital Encounter encounter on 03/22/17   DUPLEX CAROTID BILATERAL       PHYSICAL EXAM:  General:    Alert, cooperative, no distress, appears stated age.     Head:   Normocephalic, without obvious abnormality, atraumatic.  Eyes:   Conjunctivae/corneas clear.  PERRLA  Nose:  Nares normal. No drainage or sinus tenderness.  Throat:    Lips, mucosa, and tongue normal.  No Thrush  Neck:  Supple, symmetrical,  no adenopathy, thyroid: non tender    no carotid bruit and no JVD.  Back:    Symmetric,  No CVA tenderness.  Lungs:   Clear to auscultation bilaterally.  No Wheezing or Rhonchi. No  rales.  Chest wall:  No tenderness or deformity. No Accessory muscle use.  Heart:   Regular rate and rhythm,  no murmur, rub or gallop.  Abdomen:   Soft, non-tender. Not distended.  Bowel sounds normal. No masses  Extremities: Extremities normal, atraumatic, No cyanosis.  No edema. No clubbing  Skin:     Texture, turgor normal. No rashes or lesions.  Not Jaundiced  Lymph nodes: Cervical, supraclavicular normal.  Psych: Confused      NEUROLOGICAL EXAM:  Appearance:  The patient is well developed, well nourished,  and is in no acute distress.   Mental Status: Oriented to  person. Mood and affect flat.   Cranial Nerves:   Intact visual fields. Fundi are benign. PERLA, EOM's full, no nystagmus, no ptosis. Facial sensation is normal. Corneal reflexes are intact. Facial movement is symmetric. Hearing is normal bilaterally. Palate is midline with normal sternocleidomastoid and trapezius muscles are normal. Tongue is midline.   Motor:  5-/5 strength in upper and lower proximal and distal muscles. Normal bulk and tone. No fasciculations.   Reflexes:   Deep tendon reflexes 2+/4 and symmetrical.   Sensory:    Equivocal sensation to touch, pinprick and vibration.   Gait:   Unsteady gait.   Tremor:    Mild tremor noted.   Cerebellar:  No cerebellar signs present.   Neurovascular:  Normal heart sounds and regular rhythm, peripheral pulses intact, and no carotid bruits.  Assesment  1. Late onset Alzheimer's disease without behavioral disturbance    - REFERRAL TO HOME HEALTH    2. Seizure (HCC)  Stable    3. Complex partial seizure evolving to generalized seizure (HCC)  Stable    4. Encephalopathy  Stable  - REFERRAL TO HOME HEALTH    5. Hypersomnia    - methylphenidate HCl (RITALIN) 5 mg tablet; Take 1 Tab by mouth two (2) times a day. Max Daily Amount: 10 mg.  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 Max Daily Amount: 10 mg.  Dispense: 60 Tab; Refill: 0    ___________________________________________________  PLAN:      ICD-10-CM ICD-9-CM    1. Late onset Alzheimer's disease without behavioral disturbance G30.1 331.0 REFERRAL TO HOME HEALTH    F02.80 294.10    2. Seizure (HCC) R56.9 780.39    3. Complex partial seizure evolving to generalized seizure (HCC) G40.209 345.40    4. Encephalopathy G93.40 348.30 REFERRAL TO HOME HEALTH   5. 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 Return in about 2 months (around 05/13/2018).               ___________________________________________________    Attending Physician: Forrestine Him, MD

## 2018-03-13 NOTE — Progress Notes (Signed)
 Chief Complaint   Patient presents with   . Seizure     1. Have you been to the ER, urgent care clinic since your last visit?  Hospitalized since your last visit? [facility] 02/23/18    2. Have you seen or consulted any other health care providers outside of the [facility] since your last visit?  Include any pap smears or colon screening. No

## 2018-03-14 ENCOUNTER — Encounter: Attending: Nurse Practitioner | Primary: Internal Medicine

## 2018-03-15 ENCOUNTER — Inpatient Hospital Stay
Admit: 2018-03-15 | Discharge: 2018-03-15 | Disposition: A | Discharge status: Home / Self Care | Payer: MEDICARE | Attending: Emergency Medicine

## 2018-03-15 ENCOUNTER — Emergency Department: Admit: 2018-03-15 | Payer: MEDICARE | Primary: Internal Medicine

## 2018-03-15 DIAGNOSIS — S0990XA Unspecified injury of head, initial encounter: Secondary | ICD-10-CM

## 2018-03-15 LAB — GLUCOSE, POC
Glucose (POC): 61 mg/dL — ABNORMAL LOW (ref 65–100)
Glucose (POC): 74 mg/dL (ref 65–100)
Glucose (POC): 93 mg/dL (ref 65–100)
Glucose (POC): 100 mg/dL (ref 65–100)

## 2018-03-15 LAB — METABOLIC PANEL, COMPREHENSIVE
A-G Ratio: 0.9 — ABNORMAL LOW (ref 1.1–2.2)
ALT (SGPT): 33 U/L (ref 12–78)
AST (SGOT): 59 U/L — ABNORMAL HIGH (ref 15–37)
Albumin: 3.7 g/dL (ref 3.5–5.0)
Alk. phosphatase: 51 U/L (ref 45–117)
Anion gap: 6 mmol/L (ref 5–15)
BUN/Creatinine ratio: 12 (ref 12–20)
BUN: 14 MG/DL (ref 6–20)
Bilirubin, total: 0.4 MG/DL (ref 0.2–1.0)
CO2: 27 mmol/L (ref 21–32)
Calcium: 9.6 MG/DL (ref 8.5–10.1)
Chloride: 105 mmol/L (ref 97–108)
Creatinine: 1.19 MG/DL — ABNORMAL HIGH (ref 0.55–1.02)
GFR est AA: 55 mL/min/{1.73_m2} — ABNORMAL LOW (ref >60)
GFR est non-AA: 45 mL/min/{1.73_m2} — ABNORMAL LOW (ref >60)
Globulin: 4.2 g/dL — ABNORMAL HIGH (ref 2.0–4.0)
Glucose: 120 mg/dL — ABNORMAL HIGH (ref 65–100)
Potassium: 3.8 mmol/L (ref 3.5–5.1)
Protein, total: 7.9 g/dL (ref 6.4–8.2)
Sodium: 138 mmol/L (ref 136–145)

## 2018-03-15 LAB — URINALYSIS W/ REFLEX CULTURE
BACTERIA, URINE: NEGATIVE /hpf
Bacteria: NEGATIVE /hpf
Blood, Urine: NEGATIVE
Blood: NEGATIVE
Glucose, Ur: NEGATIVE mg/dL
Glucose: NEGATIVE mg/dL
Nitrite, Urine: NEGATIVE
Nitrites: NEGATIVE
Protein, UA: NEGATIVE mg/dL
Protein: NEGATIVE mg/dL
Specific Gravity, UA: 1.027 (ref 1.003–1.030)
Specific gravity: 1.027 (ref 1.003–1.030)
Urobilinogen, UA, POCT: 2 EU/dL — ABNORMAL HIGH (ref 0.2–1.0)
Urobilinogen: 2 EU/dL — ABNORMAL HIGH (ref 0.2–1.0)
pH (UA): 6 (ref 5.0–8.0)
pH, UA: 6 (ref 5.0–8.0)

## 2018-03-15 LAB — CBC WITH AUTOMATED DIFF
ABS. BASOPHILS: 0 10*3/uL (ref 0.0–0.1)
ABS. EOSINOPHILS: 0.1 10*3/uL (ref 0.0–0.4)
ABS. IMM. GRANS.: 0 10*3/uL (ref 0.00–0.04)
ABS. LYMPHOCYTES: 1.8 10*3/uL (ref 0.8–3.5)
ABS. MONOCYTES: 0.6 10*3/uL (ref 0.0–1.0)
ABS. NEUTROPHILS: 4.2 10*3/uL (ref 1.8–8.0)
ABSOLUTE NRBC: 0 10*3/uL (ref 0.00–0.01)
BASOPHILS: 1 % (ref 0–1)
EOSINOPHILS: 1 % (ref 0–7)
HCT: 43.4 % (ref 35.0–47.0)
HGB: 14.1 g/dL (ref 11.5–16.0)
IMMATURE GRANULOCYTES: 0 % (ref 0.0–0.5)
LYMPHOCYTES: 26 % (ref 12–49)
MCH: 31.3 PG (ref 26.0–34.0)
MCHC: 32.5 g/dL (ref 30.0–36.5)
MCV: 96.4 FL (ref 80.0–99.0)
MONOCYTES: 9 % (ref 5–13)
MPV: 10.6 FL (ref 8.9–12.9)
NEUTROPHILS: 63 % (ref 32–75)
NRBC: 0 PER 100 WBC
PLATELET: 467 10*3/uL — ABNORMAL HIGH (ref 150–400)
RBC: 4.5 M/uL (ref 3.80–5.20)
RDW: 13.9 % (ref 11.5–14.5)
WBC: 6.8 10*3/uL (ref 3.6–11.0)

## 2018-03-15 LAB — BILIRUBIN, CONFIRM: Bilirubin UA, confirm: NEGATIVE

## 2018-03-15 LAB — 9+OXYCO+ALC-UNBUND, ORAL FLUID
Amphetamines, OF: NEGATIVE ng/mL
Barbiturates, OF: NEGATIVE ng/mL
Benzodiazepines, OF: NEGATIVE ng/mL
Cocaine and Metabolites, OF: NEGATIVE ng/mL
Ethanol, Oral Fluid: NEGATIVE %
Marijuana (THC), OF: NEGATIVE ng/mL
Methadone, OF: NEGATIVE ng/mL
Opiates, OF: NEGATIVE ng/mL
Oxycod/Oxymor, OF: NEGATIVE ng/mL
Phencyclidine, OF: NEGATIVE ng/mL
Propoxyphene, OF: NEGATIVE ng/mL

## 2018-03-15 LAB — VALPROIC ACID
Valproic Acid: 90 ug/ml (ref 50–100)
Valproic acid: 90 ug/ml (ref 50–100)

## 2018-03-15 LAB — COMPREHENSIVE METABOLIC PANEL
ALT: 33 U/L (ref 12–78)
AST: 59 U/L — ABNORMAL HIGH (ref 15–37)
Albumin/Globulin Ratio: 0.9 — ABNORMAL LOW (ref 1.1–2.2)
Albumin: 3.7 g/dL (ref 3.5–5.0)
Alkaline Phosphatase: 51 U/L (ref 45–117)
Anion Gap: 6 mmol/L (ref 5–15)
BUN: 14 MG/DL (ref 6–20)
Bun/Cre Ratio: 12 (ref 12–20)
CO2: 27 mmol/L (ref 21–32)
Calcium: 9.6 MG/DL (ref 8.5–10.1)
Chloride: 105 mmol/L (ref 97–108)
Creatinine: 1.19 MG/DL — ABNORMAL HIGH (ref 0.55–1.02)
EGFR IF NonAfrican American: 45 mL/min/{1.73_m2} — ABNORMAL LOW (ref >60)
GFR African American: 55 mL/min/{1.73_m2} — ABNORMAL LOW (ref >60)
Globulin: 4.2 g/dL — ABNORMAL HIGH (ref 2.0–4.0)
Glucose: 120 mg/dL — ABNORMAL HIGH (ref 65–100)
Potassium: 3.8 mmol/L (ref 3.5–5.1)
Sodium: 138 mmol/L (ref 136–145)
Total Bilirubin: 0.4 MG/DL (ref 0.2–1.0)
Total Protein: 7.9 g/dL (ref 6.4–8.2)

## 2018-03-15 LAB — CBC WITH AUTO DIFFERENTIAL
Basophils %: 1 % (ref 0–1)
Basophils Absolute: 0 10*3/uL (ref 0.0–0.1)
Eosinophils %: 1 % (ref 0–7)
Eosinophils Absolute: 0.1 10*3/uL (ref 0.0–0.4)
Granulocyte Absolute Count: 0 10*3/uL (ref 0.00–0.04)
Hematocrit: 43.4 % (ref 35.0–47.0)
Hemoglobin: 14.1 g/dL (ref 11.5–16.0)
Immature Granulocytes: 0 % (ref 0.0–0.5)
Lymphocytes %: 26 % (ref 12–49)
Lymphocytes Absolute: 1.8 10*3/uL (ref 0.8–3.5)
MCH: 31.3 PG (ref 26.0–34.0)
MCHC: 32.5 g/dL (ref 30.0–36.5)
MCV: 96.4 FL (ref 80.0–99.0)
MPV: 10.6 FL (ref 8.9–12.9)
Monocytes %: 9 % (ref 5–13)
Monocytes Absolute: 0.6 10*3/uL (ref 0.0–1.0)
NRBC Absolute: 0 10*3/uL (ref 0.00–0.01)
Neutrophils %: 63 % (ref 32–75)
Neutrophils Absolute: 4.2 10*3/uL (ref 1.8–8.0)
Nucleated RBCs: 0 PER 100 WBC
Platelets: 467 10*3/uL — ABNORMAL HIGH (ref 150–400)
RBC: 4.5 M/uL (ref 3.80–5.20)
RDW: 13.9 % (ref 11.5–14.5)
WBC: 6.8 10*3/uL (ref 3.6–11.0)

## 2018-03-15 LAB — POCT GLUCOSE
POC Glucose: 61 mg/dL — ABNORMAL LOW (ref 65–100)
POC Glucose: 74 mg/dL (ref 65–100)
POC Glucose: 93 mg/dL (ref 65–100)
POC Glucose: 100 mg/dL (ref 65–100)

## 2018-03-15 LAB — BILIRUBIN, CONFIRMATORY: Bilirubin Urine: NEGATIVE
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
## 2018-03-15 NOTE — ED Notes (Signed)
Gave 4oz of orange juice after every BG check. Patient given peanut butter and crackers after most recent check for BG of 93.
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
## 2018-03-15 NOTE — ED Notes (Signed)
Gave 4oz of orange juice after every BG check. Patient given peanut butter and crackers after most recent check for BG of 93.

## 2018-03-26 MED ORDER — DONEPEZIL 10 MG TAB
10 mg | ORAL_TABLET | ORAL | 0 refills | Status: DC
Start: 2018-03-26 — End: 2018-04-20

## 2018-04-03 ENCOUNTER — Encounter

## 2018-04-03 MED ORDER — METHYLPHENIDATE 5 MG TAB
5 mg | ORAL_TABLET | Freq: Two times a day (BID) | ORAL | 0 refills | Status: DC
Start: 2018-04-03 — End: 2018-05-23

## 2018-04-03 NOTE — Telephone Encounter (Signed)
Last office visit 03/13/18 next appointment 05/14/18

## 2018-04-04 NOTE — Telephone Encounter (Signed)
Called patient twice regarding refill is ready for pick up. The phone is busy.

## 2018-04-09 ENCOUNTER — Encounter

## 2018-04-09 NOTE — Telephone Encounter (Signed)
Meghan [name],    She needs an appt or needs to pick up rx for Vimpat at office.    DrGweneth Fritter.. Meghan Johnson, DNP

## 2018-04-10 MED ORDER — LACOSAMIDE 100 MG TAB
100 mg | ORAL_TABLET | ORAL | 1 refills | Status: DC
Start: 2018-04-10 — End: 2018-05-14

## 2018-04-10 NOTE — Telephone Encounter (Signed)
Pt's dgt came to pick up Rx on 04/10/18

## 2018-04-11 NOTE — Telephone Encounter (Signed)
Called dgt back at [phone].  Explained to pt's dgt that the only issue with Rx was that it could not be filled until 05/02/18 for insurance to cover costs.  If VimPat were filled before this the cost would be out of pocket.    Asked dgt about Rx p icked up on 04/04/18 for VimPat.  dgt explained that the Rx could not be located.  That pt may have thrown Rx away.  dgt expressed understanding of situation.

## 2018-04-11 NOTE — Telephone Encounter (Signed)
Call from pt's dgt who stated pharmacy would not fill pt's Rx for VimPat.  Stated "the Dr has denied RX".  Explained that we gave pt RX so had not denied Rx.  Told pt's dgt that I would call pharmacy to see if I could find reason for pharmacy beiing able to fill Rx

## 2018-04-11 NOTE — Telephone Encounter (Signed)
Call to pharmacy and was told it was too early to fill Rx.  That Rx VimPat could be filled on Sept 19th and insurance would pay for Rx.

## 2018-04-16 MED ORDER — TOPIRAMATE 50 MG TAB
50 mg | ORAL_TABLET | ORAL | 2 refills | Status: DC
Start: 2018-04-16 — End: 2018-05-14

## 2018-04-20 ENCOUNTER — Encounter

## 2018-04-22 ENCOUNTER — Ambulatory Visit
Admit: 2018-04-22 | Discharge: 2018-04-22 | Payer: PRIVATE HEALTH INSURANCE | Attending: Nurse Practitioner | Primary: Internal Medicine

## 2018-04-22 ENCOUNTER — Ambulatory Visit: Attending: Nurse Practitioner | Primary: Internal Medicine

## 2018-04-22 DIAGNOSIS — H9193 Unspecified hearing loss, bilateral: Secondary | ICD-10-CM

## 2018-04-22 MED ORDER — DONEPEZIL 10 MG TAB
10 mg | ORAL_TABLET | ORAL | 0 refills | Status: DC
Start: 2018-04-22 — End: 2018-05-17

## 2018-04-22 MED ORDER — TOPIRAMATE 50 MG TAB
50 mg | ORAL_TABLET | ORAL | 3 refills | Status: DC
Start: 2018-04-22 — End: 2018-05-14

## 2018-04-22 MED ORDER — LISINOPRIL 10 MG TAB
10 mg | ORAL_TABLET | ORAL | 2 refills | Status: DC
Start: 2018-04-22 — End: 2018-05-23

## 2018-04-22 NOTE — Progress Notes (Signed)
Chief Complaint   Patient presents with   ??? Depression   ??? Dementia   ??? Arm Problem     right arm is shaking     1. Have you been to the ER, urgent care clinic since your last visit?  Hospitalized since your last visit?No    2. Have you seen or consulted any other health care providers outside of the [facility] since your last visit?  Include any pap smears or colon screening. No
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
## 2018-04-22 NOTE — Patient Instructions (Addendum)
Hearing Loss: Care Instructions  Your Care Instructions    Hearing loss is a sudden or slow decrease in how well you hear. It can range from mild to severe. Permanent hearing loss can occur with aging. It also can happen when you are exposed long-term to loud noise. Examples include listening to loud music, riding motorcycles, or being around other loud machines.  Hearing loss can affect your work and home life. It can make you feel lonely or depressed. You may feel that you have lost your independence. But hearing aids and other devices can help you hear better and feel connected to others.  Follow-up care is a key part of your treatment and safety. Be sure to make and go to all appointments, and call your doctor if you are having problems. It's also a good idea to know your test results and keep a list of the medicines you take.  How can you care for yourself at home?  ?? Avoid loud noises whenever possible. This helps keep your hearing from getting worse.  ?? Always wear hearing protection around loud noises.  ?? Wear a hearing aid as directed. See a person who can help you pick a hearing aid that fits you.  ?? Have hearing tests as your doctor suggests. They can show whether your hearing has changed. Your hearing aid may need to be adjusted.  ?? Use other devices as needed. These may include:  ? Telephone amplifiers and hearing aids that can connect to a television, stereo, radio, or microphone.  ? Devices that use lights or vibrations. These alert you to the doorbell, a ringing telephone, or a baby monitor.  ? Television closed-captioning. This shows the words at the bottom of the screen. Most new TVs can do this.  ? TTY (text telephone). This lets you type messages back and forth on the telephone instead of talking or listening. These devices are also called TDD. When messages are typed on the keyboard, they are sent over the phone line to a receiving TTY. The message is shown on a monitor.   ?? Use pagers, fax machines, and email if it is hard for you to communicate by telephone.  ?? Try to learn a listening technique called speech-reading. It is not lip-reading. You pay attention to people's gestures, expressions, posture, and tone of voice. These clues can help you understand what a person is saying. Face the person you are talking to, and have him or her face you. Make sure the lighting is good. You need to see the other person's face clearly.  ?? Think about counseling if you need help to adjust to your hearing loss.  When should you call for help?  Watch closely for changes in your health, and be sure to contact your doctor if:  ?? ?? You think your hearing is getting worse.   ?? ?? You have new symptoms, such as dizziness or nausea.   Where can you learn more?  Go to [employer].  Enter [id number] in the search box to learn more about "Hearing Loss: Care Instructions."  Current as of: June 03, 2017  Content Version: 12.1  ?? 2006-2019 [employer]. Care instructions adapted under license by [employer] (which disclaims liability or warranty for this information). If you have questions about a medical condition or this instruction, always ask your healthcare professional. [employer] disclaims any warranty or liability for your use of this information.         Alzheimer's Disease:  Care Instructions  Your Care Instructions    Alzheimer's disease is a type of dementia. It causes memory loss and affects judgment, language, and behavior. You may have trouble making decisions or may get lost in places that you used to know well. Alzheimer's disease is different than mild memory loss that occurs with aging. It is not clear what causes Alzheimer's disease, but it is the most common form of dementia in older adults.  Finding out that you have this disease is a shock. You may be afraid and  worried about how the condition will change your life. Although there is no cure at this time, medicine in some cases may slow memory loss for a while. Other medicines may be able to help you sleep or cope with depression and behavior changes.  Alzheimer's disease is different for everyone. It may take many years to develop. In some cases, people can function well for a long time. In the early stage of the disease, you can do things at home to make life easier and safer. You also can keep doing your hobbies and other activities. Many people find comfort in planning now for their future needs.  Follow-up care is a key part of your treatment and safety. Be sure to make and go to all appointments, and call your doctor if you are having problems. It's also a good idea to know your test results and keep a list of the medicines you take.  How can you care for yourself at home?  Taking care of yourself  ?? If your doctor gives you medicines, take them exactly as prescribed. Call your doctor if you think you are having a problem with your medicine. You will get more details on the medicines your doctor prescribes.  ?? Eat a balanced diet. Get plenty of whole grains, fruits, and vegetables every day. If you are not hungry at mealtimes, eat snacks at midmorning and in the afternoon. Try drinks such as Boost, Ensure, or Sustacal if you are having trouble keeping your weight up.  ?? Stay active. Exercise such as walking may slow the decline of your mental abilities. Try to stay active mentally too. Read and work crossword puzzles if you enjoy these activities.  ?? If you have trouble sleeping, do not nap during the day. Get regular exercise (but not within several hours of bedtime). Drink a glass of warm milk or caffeine-free herbal tea before going to bed.  ?? Ask your doctor about support groups and other resources in your area. They can help people who have Alzheimer's disease and their families.   ?? Be patient. You may find that a task takes you longer than it used to.  ?? If you have not already done so, make a list of advance directives. Advance directives are instructions to your doctor and family members about what kind of care you want if you become unable to speak or express yourself. Talk to a lawyer about making a will, if you do not already have one.  Keeping schedules  ?? Develop a routine. You will feel less frustrated or confused if you have a clear, simple plan of what to do every day.  ? Make lists of your medicines and when to take them.  ? Write down appointments and other tasks in a calendar.  ? Put sticky notes around the house to help you remember events and other things you have to do.  ? Schedule activities and tasks for times of the  day when you are best able to handle them.  Staying safe  ?? Tell someone when you are going out and where you are going. Let the person know when you will be back. Before you go out alone, write down where you are going, how to get there, and how to get back home. Do this even if you have gone there many times before. Take someone along with you when possible.  ?? Make your home safe. Tack down rugs, put no-slip tape in the tub, use handrails, and put safety switches on stoves and appliances.  ?? Have a family member or other caregiver tell you whether you are driving badly. Deciding to stop driving is very hard for many people. Driving helps you feel independent. Your state driver's license bureau can do a driving test if there is any question. Plan for other means of getting around when you are no longer able to drive.  ?? Use strong lighting, especially at night. Put night-lights in bedrooms, hallways, and bathrooms.  ?? Lower the hot water temperature setting to 120??F or lower to avoid burns.  When should you call for help?  Call 911 anytime you think you may need emergency care. For example, call if:  ?? ?? You are lost and do not know whom to call.    ?? ?? You are injured and do not know whom to call.   ??Call your doctor now or seek immediate medical care if:  ?? ?? Your symptoms suddenly get much worse.   ??Watch closely for changes in your health, and be sure to contact your doctor if:  ?? ?? You want more information about how you can take care of yourself.   Where can you learn more?  Go to [employer].  Enter Y179 in the search box to learn more about "Alzheimer's Disease: Care Instructions."  Current as of: April 24, 2017  Content Version: 12.1  ?? 2006-2019 [employer]. Care instructions adapted under license by [employer] (which disclaims liability or warranty for this information). If you have questions about a medical condition or this instruction, always ask your healthcare professional. [employer] disclaims any warranty or liability for your use of this information.

## 2018-04-22 NOTE — Progress Notes (Signed)
 Chief Complaint   Patient presents with   . Depression   . Dementia   . Arm Problem     right arm is shaking     1. Have you been to the ER, urgent care clinic since your last visit?  Hospitalized since your last visit?No    2. Have you seen or consulted any other health care providers outside of the [facility] since your last visit?  Include any pap smears or colon screening. No
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
## 2018-04-24 ENCOUNTER — Encounter: Attending: Nurse Practitioner | Primary: Internal Medicine

## 2018-05-01 NOTE — Telephone Encounter (Signed)
Meghan [name],    I sent this back because she is not currently taking this medication as far as I can tell. I believe it was discontinued by neurology. They will need to contact Dr. Lennart PallAralu.  Please contact the patient.    Thanks,  Dr. BLeonard Schwartz

## 2018-05-03 NOTE — Telephone Encounter (Signed)
Patient was prescribed  Gabapentin by another provider on 02/27/18. Patient's daughter is requesting a refill but not sure if the medication was discontinued. Please advise.

## 2018-05-06 NOTE — Telephone Encounter (Signed)
Patient's daughter made aware Gabapentin was discontinued.

## 2018-05-14 ENCOUNTER — Ambulatory Visit
Admit: 2018-05-14 | Discharge: 2018-05-14 | Payer: PRIVATE HEALTH INSURANCE | Attending: Neurology | Primary: Internal Medicine

## 2018-05-14 ENCOUNTER — Ambulatory Visit: Attending: Neurology | Primary: Internal Medicine

## 2018-05-14 DIAGNOSIS — G40209 Localization-related (focal) (partial) symptomatic epilepsy and epileptic syndromes with complex partial seizures, not intractable, without status epilepticus: Secondary | ICD-10-CM

## 2018-05-14 MED ORDER — MEMANTINE 10 MG TAB
10 mg | ORAL_TABLET | Freq: Two times a day (BID) | ORAL | 2 refills | Status: DC
Start: 2018-05-14 — End: 2018-07-11

## 2018-05-14 MED ORDER — PRAMIPEXOLE 1 MG TAB
1 mg | ORAL_TABLET | Freq: Three times a day (TID) | ORAL | 1 refills | Status: DC
Start: 2018-05-14 — End: 2018-05-15

## 2018-05-14 MED ORDER — METHYLPHENIDATE 5 MG TAB
5 mg | ORAL_TABLET | Freq: Two times a day (BID) | ORAL | 0 refills | Status: DC
Start: 2018-05-14 — End: 2018-05-23

## 2018-05-14 MED ORDER — PRAMIPEXOLE ER 4.5 MG TABLET,EXTENDED RELEASE 24 HR
4.5 mg | ORAL_TABLET | Freq: Every day | ORAL | 5 refills | Status: DC
Start: 2018-05-14 — End: 2018-05-14

## 2018-05-14 MED ORDER — LACOSAMIDE 100 MG TAB
100 mg | ORAL_TABLET | ORAL | 1 refills | Status: DC
Start: 2018-05-14 — End: 2018-05-23

## 2018-05-14 MED ORDER — TOPIRAMATE 100 MG TAB
100 mg | ORAL_TABLET | Freq: Every evening | ORAL | 2 refills | Status: DC
Start: 2018-05-14 — End: 2018-05-14

## 2018-05-14 MED ORDER — TOPIRAMATE 100 MG TAB
100 mg | ORAL_TABLET | ORAL | 2 refills | Status: DC
Start: 2018-05-14 — End: 2018-08-28

## 2018-05-14 NOTE — Progress Notes (Signed)
Neurology Progress Note    NAME:  Meghan [name]   DOB:   04/24/1946   MRN:   [id number]     Date/Time:  05/14/2018  Subjective:     Meghan [name] is a 72 y.o. female here today for follow-up for seizure disorder, memory difficulty, neuropathy, pain confusion.  Patient was accompanied by her daughter to the visit.  She is doing much better, according to her daughter who was with her at today's visit.  Patient was more alert today, communicated more than before.  According to daughter, no seizure since the last visit.  Patient today was consistently following command unlike in the last visit when patient was not consistently following command.  In the previous visit, nursing home placement was entertained as sister felt that it may be too much for them taking care of her in the near future, however, daughter says today she is going to take time off and be with her mother for a while and see how much it can be rehabilitated  I supported the decision taken by patient's daughter.  Review of Systems - General ROS: positive for  - fatigue and sleep disturbance  Psychological ROS: positive for - anxiety, concentration difficulties, memory difficulties and sleep disturbances  Ophthalmic ROS: positive for - blurry vision and photophobia  ENT ROS: positive for - headaches, tinnitus, vertigo and visual changes  Allergy and Immunology ROS: negative  Hematological and Lymphatic ROS: negative  Endocrine ROS: negative  Respiratory ROS: no cough, shortness of breath, or wheezing  Cardiovascular ROS: no chest pain or dyspnea on exertion  Gastrointestinal ROS: no abdominal pain, change in bowel habits, or black or bloody stools  Genito-Urinary ROS: no dysuria, trouble voiding, or hematuria  Musculoskeletal ROS: positive for - gait disturbance, joint pain, joint stiffness, muscle pain and muscular weakness  Neurological ROS: positive for - dizziness, gait disturbance, headaches,  impaired coordination/balance, numbness/tingling, seizures, tremors, visual changes and weakness  Dermatological ROS: negative    Medications reviewed:  Current Outpatient Medications   Medication Sig Dispense Refill   ??? donepezil (ARICEPT) 10 mg tablet TAKE 1 TABLET BY MOUTH EVERY NIGHT 30 Tab 0   ??? lisinopril (PRINIVIL, ZESTRIL) 10 mg tablet TAKE 1 TABLET BY MOUTH DAILY 90 Tab 2   ??? topiramate (TOPAMAX) 50 mg tablet TAKE 1 TABLET BY MOUTH TWICE DAILY 60 Tab 2   ??? lacosamide (VIMPAT) 100 mg tab tablet TAKE 1 TABLET BY MOUTH TWICE DAILY 60 Tab 1   ??? methylphenidate HCl (RITALIN) 5 mg tablet Take 1 Tab by mouth two (2) times a day. Max Daily Amount: 10 mg. 60 Tab 0   ??? memantine (NAMENDA) 10 mg tablet TAKE 1 TABLET BY MOUTH TWICE DAILY 180 Tab 2   ??? divalproex DR (DEPAKOTE) 500 mg tablet TAKE 1 TABLET BY MOUTH TWICE DAILY 180 Tab 0   ??? citalopram (CELEXA) 20 mg tablet Take 1 Tab by mouth daily. 90 Tab 3   ??? fenofibrate (LOFIBRA) 54 mg tablet Take 1 Tab by mouth nightly. 90 Tab 3   ??? hydrALAZINE (APRESOLINE) 25 mg tablet Take 1 Tab by mouth three (3) times daily as needed (systolic blood pressure greater than 170 or diastolic blood pressure greater than 110). 30 Tab 0   ??? cholecalciferol (VITAMIN D3) 1,000 unit cap take 1 capsule by mouth once daily 30 Cap 3   ??? fluticasone (FLONASE) 50 mcg/actuation nasal spray 1 Spray by Both Nostrils route daily. 1 Bottle 1   ???  aspirin 81 mg chewable tablet Take 81 mg by mouth daily.     ??? gabapentin (NEURONTIN) 100 mg capsule TK ONE C PO  TID  1        Objective:   Vitals:  Vitals:    05/14/18 1142   BP: 100/66   Pulse: 91   Resp: 16   Temp: 98 ??F (36.7 ??C)   TempSrc: Temporal   SpO2: 96%   Weight: 165 lb 3.2 oz (74.9 kg)   Height: 5\' 5"  (1.651 m)   PainSc:   0 - No pain     Lab Data Reviewed:  Lab Results   Component Value Date/Time    WBC 6.8 03/15/2018 01:58 PM    HCT 43.4 03/15/2018 01:58 PM    HGB 14.1 03/15/2018 01:58 PM    PLATELET 467 (H) 03/15/2018 01:58 PM        Lab Results   Component Value Date/Time    Sodium 138 03/15/2018 01:58 PM    Potassium 3.8 03/15/2018 01:58 PM    Chloride 105 03/15/2018 01:58 PM    CO2 27 03/15/2018 01:58 PM    Glucose 120 (H) 03/15/2018 01:58 PM    BUN 14 03/15/2018 01:58 PM    Creatinine 1.19 (H) 03/15/2018 01:58 PM    Calcium 9.6 03/15/2018 01:58 PM       No components found for: TROPQUANT    No results found for: ANA      Lab Results   Component Value Date/Time    Hemoglobin A1c 5.4 02/25/2018 03:49 AM    Hemoglobin A1c (POC) 5.4 01/08/2018 10:09 AM        Lab Results   Component Value Date/Time    Vitamin B12 1,276 (H) 01/02/2018 10:30 AM    Folate 32.1 (H) 01/02/2018 10:30 AM       No results found for: ANA, Everlean Alstrom, XBANA    Lab Results   Component Value Date/Time    Cholesterol, total 198 02/25/2018 03:49 AM    Cholesterol (POC) 169 01/08/2018 10:10 AM    HDL Cholesterol 48 02/25/2018 03:49 AM    HDL Cholesterol (POC) 45 01/08/2018 10:10 AM    LDL Cholesterol (POC) 108 16/05/9603 10:10 AM    LDL, calculated 132 (H) 02/25/2018 03:49 AM    VLDL, calculated 18 02/25/2018 03:49 AM    Triglyceride 90 02/25/2018 03:49 AM    Triglycerides (POC) 80 01/08/2018 10:10 AM    CHOL/HDL Ratio 4.1 02/25/2018 03:49 AM         CT Results (recent):  Results from Hospital Encounter encounter on 03/15/18   CT HEAD WO CONT    Narrative HEAD CT WITHOUT CONTRAST: 03/15/2018 2:32 PM    INDICATION: Seizure, new, acute, >18yo, hx of trauma    COMPARISON: 02/23/2018, 12/31/2017.    PROCEDURE: Axial images of the head were obtained without contrast. Coronal and  sagittal reformats were performed. CT dose reduction was achieved through use of  a standardized protocol tailored for this examination and automatic exposure  control for dose modulation.    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  Incidental note is made of empty sella. The ventricles and sulci are appropriate  in size and configuration for age. No loss of gray-white differentiation to  suggest late acute or early subacute infarction. No mass effect or intracranial  hemorrhage.      Impression IMPRESSION: No acute intracranial abnormality. Scaphocephaly.       MRI Results (recent):  Results from Hospital Encounter encounter on 02/23/18   MRI BRAIN WO CONT    Narrative EXAM: MRI BRAIN WO CONT    INDICATION: aphasia    COMPARISON: MRI 01/02/2018, CT 02/23/2018.    CONTRAST: None.    TECHNIQUE:    Multiplanar multisequence acquisition without contrast of the brain.    FINDINGS:  The ventricles are normal in size and position. There is no acute infarct,  hemorrhage, extra-axial fluid collection, or mass effect. There is no cerebellar  tonsillar herniation. Expected arterial flow-voids are present. An empty sella  appearance is again shown.    The paranasal sinuses, mastoid air cells, and middle ears are clear. The orbital  contents are within normal limits. No significant osseous or scalp lesions are  identified.      Impression IMPRESSION:   No acute findings.       IR Results (recent):  No results found for this or any previous visit.    VAS/US Results (recent):  Results from Hospital Encounter encounter on 03/22/17   DUPLEX CAROTID BILATERAL       PHYSICAL EXAM:  General:    Alert, cooperative, no distress, appears stated age.     Head:   Normocephalic, without obvious abnormality, atraumatic.  Eyes:   Conjunctivae/corneas clear.  PERRLA  Nose:  Nares normal. No drainage or sinus tenderness.  Throat:    Lips, mucosa, and tongue normal.  No Thrush  Neck:  Supple, symmetrical,  no adenopathy, thyroid: non tender    no carotid bruit and no JVD.  Back:    Symmetric,  No CVA tenderness.  Lungs:   Clear to auscultation bilaterally.  No Wheezing or Rhonchi. No rales.   Chest wall:  No tenderness or deformity. No Accessory muscle use.  Heart:   Regular rate and rhythm,  no murmur, rub or gallop.  Abdomen:   Soft, non-tender. Not distended.  Bowel sounds normal. No masses  Extremities: Extremities normal, atraumatic, No cyanosis.  No edema. No clubbing  Skin:     Texture, turgor normal. No rashes or lesions.  Not Jaundiced  Lymph nodes: Cervical, supraclavicular normal.  Psych:  Good insight.  Not depressed.  Not anxious or agitated.      NEUROLOGICAL EXAM:  Appearance:  The patient is well developed, well nourished, provides  and is in no acute distress.   Mental Status: Oriented to time, place and person. Mood and affect appropriate.   Cranial Nerves:   Intact visual fields. Fundi are benign. PERLA, EOM's full, no nystagmus, no ptosis. Facial sensation is normal. Corneal reflexes are intact. Facial movement is symmetric. Hearing is normal bilaterally. Palate is midline with normal sternocleidomastoid and trapezius muscles are normal. Tongue is midline.   Motor:  5-/5 strength in upper and lower proximal and distal muscles. Normal bulk and tone. No fasciculations.   Reflexes:   Deep tendon reflexes 3+/4 and symmetrical.   Sensory:    Dysesthesia to touch, pinprick and vibration.   Gait:   Slightly unsteady l gait.   Tremor:    Mild tremor noted.   Cerebellar:  No cerebellar signs present.   Neurovascular:  Normal heart sounds and regular rhythm, peripheral pulses intact, and no carotid bruits.       Assesment  1. Seizure (HCC)    - lacosamide (VIMPAT) 100 mg tab tablet; TAKE 1 TABLET BY  MOUTH TWICE DAILY  Dispense: 60 Tab; Refill: 1    2. Hypersomnia    - methylphenidate HCl (RITALIN) 5 mg tablet; Take 1 Tab by mouth two (2) times a day. Max Daily Amount: 10 mg.  Dispense: 60 Tab; Refill: 0    3. Attention deficit disorder (ADD) without hyperactivity    - methylphenidate HCl (RITALIN) 5 mg tablet; Take 1 Tab by mouth two (2)  times a day. Max Daily Amount: 10 mg.  Dispense: 60 Tab; Refill: 0    4. Partial symptomatic epilepsy with complex partial seizures, not intractable, without status epilepticus (HCC)  Continue current management    5. Tremor  Continue management    ___________________________________________________  PLAN: Medication and plan discussed with patient's daughter      ICD-10-CM ICD-9-CM    1. Partial symptomatic epilepsy with complex partial seizures, not intractable, without status epilepticus (HCC) G40.209 345.40    2. Seizure (HCC) R56.9 780.39 lacosamide (VIMPAT) 100 mg tab tablet   3. Hypersomnia G47.10 780.54 methylphenidate HCl (RITALIN) 5 mg tablet   4. Attention deficit disorder (ADD) without hyperactivity F98.8 314.00 methylphenidate HCl (RITALIN) 5 mg tablet   5. Tremor R25.1 781.0      Follow-up and Dispositions    ?? Return in about 3 months (around 08/14/2018).               ___________________________________________________    Attending Physician: Forrestine Himletus C Huy Majid, MD

## 2018-05-14 NOTE — Progress Notes (Signed)
Chief Complaint   Patient presents with   ??? Seizure     1. Have you been to the ER, urgent care clinic since your last visit?  Hospitalized since your last visit? no    2. Have you seen or consulted any other health care providers outside of the [facility] since your last visit?  Include any pap smears or colon screening. No          Pill count not performed patient did not bring medications        Pill count not verified with patient  Patient reports taking medication      Buccal Swab obtained and sent 03/13/18  Pain contract on file  PMP made available for Dr. Aralu review  Script given for Vimpat and ritalin    Patient signed control substance contract

## 2018-05-14 NOTE — Telephone Encounter (Signed)
Pramipexole ER 4.5 mg tablet is not covered per insurance plan. Alternative is Pramipexole IR or Ropinirole. Fax over a new prescription per [facility].

## 2018-05-14 NOTE — Progress Notes (Signed)
 Chief Complaint   Patient presents with   . Seizure     1. Have you been to the ER, urgent care clinic since your last visit?  Hospitalized since your last visit? no    2. Have you seen or consulted any other health care providers outside of the [facility] since your last visit?  Include any pap smears or colon screening. No          Pill count not performed patient did not bring medications        Pill count not verified with patient  Patient reports taking medication      Buccal Swab obtained and sent 03/13/18  Pain contract on file  PMP made available for Dr. Girard review  Script given for Vimpat  and ritalin    Patient signed control substance contract

## 2018-05-14 NOTE — Progress Notes (Signed)
Neurology Progress Note    NAME:  Meghan [name]   DOB:   01/06/1946   MRN:   [id number]     Date/Time:  05/14/2018  Subjective:     Meghan [name] is a 72 y.o. female here today for follow-up for seizure disorder, memory difficulty, neuropathy, pain confusion.  Patient was accompanied by her daughter to the visit.  She is doing much better, according to her daughter who was with her at today's visit.  Patient was more alert today, communicated more than before.  According to daughter, no seizure since the last visit.  Patient today was consistently following command unlike in the last visit when patient was not consistently following command.  In the previous visit, nursing home placement was entertained as sister felt that it may be too much for them taking care of her in the near future, however, daughter says today she is going to take time off and be with her mother for a while and see how much it can be rehabilitated  I supported the decision taken by patient's daughter.  Review of Systems - General ROS: positive for  - fatigue and sleep disturbance  Psychological ROS: positive for - anxiety, concentration difficulties, memory difficulties and sleep disturbances  Ophthalmic ROS: positive for - blurry vision and photophobia  ENT ROS: positive for - headaches, tinnitus, vertigo and visual changes  Allergy and Immunology ROS: negative  Hematological and Lymphatic ROS: negative  Endocrine ROS: negative  Respiratory ROS: no cough, shortness of breath, or wheezing  Cardiovascular ROS: no chest pain or dyspnea on exertion  Gastrointestinal ROS: no abdominal pain, change in bowel habits, or black or bloody stools  Genito-Urinary ROS: no dysuria, trouble voiding, or hematuria  Musculoskeletal ROS: positive for - gait disturbance, joint pain, joint stiffness, muscle pain and muscular weakness  Neurological ROS: positive for - dizziness, gait disturbance, headaches, impaired coordination/balance, numbness/tingling,  seizures, tremors, visual changes and weakness  Dermatological ROS: negative    Medications reviewed:  Current Outpatient Medications   Medication Sig Dispense Refill   ??? donepezil (ARICEPT) 10 mg tablet TAKE 1 TABLET BY MOUTH EVERY NIGHT 30 Tab 0   ??? lisinopril (PRINIVIL, ZESTRIL) 10 mg tablet TAKE 1 TABLET BY MOUTH DAILY 90 Tab 2   ??? topiramate (TOPAMAX) 50 mg tablet TAKE 1 TABLET BY MOUTH TWICE DAILY 60 Tab 2   ??? lacosamide (VIMPAT) 100 mg tab tablet TAKE 1 TABLET BY MOUTH TWICE DAILY 60 Tab 1   ??? methylphenidate HCl (RITALIN) 5 mg tablet Take 1 Tab by mouth two (2) times a day. Max Daily Amount: 10 mg. 60 Tab 0   ??? memantine (NAMENDA) 10 mg tablet TAKE 1 TABLET BY MOUTH TWICE DAILY 180 Tab 2   ??? divalproex DR (DEPAKOTE) 500 mg tablet TAKE 1 TABLET BY MOUTH TWICE DAILY 180 Tab 0   ??? citalopram (CELEXA) 20 mg tablet Take 1 Tab by mouth daily. 90 Tab 3   ??? fenofibrate (LOFIBRA) 54 mg tablet Take 1 Tab by mouth nightly. 90 Tab 3   ??? hydrALAZINE (APRESOLINE) 25 mg tablet Take 1 Tab by mouth three (3) times daily as needed (systolic blood pressure greater than 170 or diastolic blood pressure greater than 110). 30 Tab 0   ??? cholecalciferol (VITAMIN D3) 1,000 unit cap take 1 capsule by mouth once daily 30 Cap 3   ??? fluticasone (FLONASE) 50 mcg/actuation nasal spray 1 Spray by Both Nostrils route daily. 1 Bottle 1   ???  aspirin 81 mg chewable tablet Take 81 mg by mouth daily.     ??? gabapentin (NEURONTIN) 100 mg capsule TK ONE C PO  TID  1        Objective:   Vitals:  Vitals:    05/14/18 1142   BP: 100/66   Pulse: 91   Resp: 16   Temp: 98 ??F (36.7 ??C)   TempSrc: Temporal   SpO2: 96%   Weight: 165 lb 3.2 oz (74.9 kg)   Height: 5\' 5"  (1.651 m)   PainSc:   0 - No pain     Lab Data Reviewed:  Lab Results   Component Value Date/Time    WBC 6.8 03/15/2018 01:58 PM    HCT 43.4 03/15/2018 01:58 PM    HGB 14.1 03/15/2018 01:58 PM    PLATELET 467 (H) 03/15/2018 01:58 PM       Lab Results   Component Value Date/Time    Sodium 138  03/15/2018 01:58 PM    Potassium 3.8 03/15/2018 01:58 PM    Chloride 105 03/15/2018 01:58 PM    CO2 27 03/15/2018 01:58 PM    Glucose 120 (H) 03/15/2018 01:58 PM    BUN 14 03/15/2018 01:58 PM    Creatinine 1.19 (H) 03/15/2018 01:58 PM    Calcium 9.6 03/15/2018 01:58 PM       No components found for: TROPQUANT    No results found for: ANA      Lab Results   Component Value Date/Time    Hemoglobin A1c 5.4 02/25/2018 03:49 AM    Hemoglobin A1c (POC) 5.4 01/08/2018 10:09 AM        Lab Results   Component Value Date/Time    Vitamin B12 1,276 (H) 01/02/2018 10:30 AM    Folate 32.1 (H) 01/02/2018 10:30 AM       No results found for: ANA, Everlean Alstrom, XBANA    Lab Results   Component Value Date/Time    Cholesterol, total 198 02/25/2018 03:49 AM    Cholesterol (POC) 169 01/08/2018 10:10 AM    HDL Cholesterol 48 02/25/2018 03:49 AM    HDL Cholesterol (POC) 45 01/08/2018 10:10 AM    LDL Cholesterol (POC) 108 16/05/9603 10:10 AM    LDL, calculated 132 (H) 02/25/2018 03:49 AM    VLDL, calculated 18 02/25/2018 03:49 AM    Triglyceride 90 02/25/2018 03:49 AM    Triglycerides (POC) 80 01/08/2018 10:10 AM    CHOL/HDL Ratio 4.1 02/25/2018 03:49 AM         CT Results (recent):  Results from Hospital Encounter encounter on 03/15/18   CT HEAD WO CONT    Narrative HEAD CT WITHOUT CONTRAST: 03/15/2018 2:32 PM    INDICATION: Seizure, new, acute, >18yo, hx of trauma    COMPARISON: 02/23/2018, 12/31/2017.    PROCEDURE: Axial images of the head were obtained without contrast. Coronal and  sagittal reformats were performed. CT dose reduction was achieved through use of  a standardized protocol tailored for this examination and automatic exposure  control for dose modulation.    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  Incidental note is made of  empty sella. The ventricles and sulci are appropriate  in size and configuration for age. No loss of gray-white differentiation to  suggest late acute or early subacute infarction. No mass effect or intracranial  hemorrhage.      Impression IMPRESSION: No acute intracranial abnormality. Scaphocephaly.       MRI Results (recent):  Results from Hospital Encounter encounter on 02/23/18   MRI BRAIN WO CONT    Narrative EXAM: MRI BRAIN WO CONT    INDICATION: aphasia    COMPARISON: MRI 01/02/2018, CT 02/23/2018.    CONTRAST: None.    TECHNIQUE:    Multiplanar multisequence acquisition without contrast of the brain.    FINDINGS:  The ventricles are normal in size and position. There is no acute infarct,  hemorrhage, extra-axial fluid collection, or mass effect. There is no cerebellar  tonsillar herniation. Expected arterial flow-voids are present. An empty sella  appearance is again shown.    The paranasal sinuses, mastoid air cells, and middle ears are clear. The orbital  contents are within normal limits. No significant osseous or scalp lesions are  identified.      Impression IMPRESSION:   No acute findings.       IR Results (recent):  No results found for this or any previous visit.    VAS/US Results (recent):  Results from Hospital Encounter encounter on 03/22/17   DUPLEX CAROTID BILATERAL       PHYSICAL EXAM:  General:    Alert, cooperative, no distress, appears stated age.     Head:   Normocephalic, without obvious abnormality, atraumatic.  Eyes:   Conjunctivae/corneas clear.  PERRLA  Nose:  Nares normal. No drainage or sinus tenderness.  Throat:    Lips, mucosa, and tongue normal.  No Thrush  Neck:  Supple, symmetrical,  no adenopathy, thyroid: non tender    no carotid bruit and no JVD.  Back:    Symmetric,  No CVA tenderness.  Lungs:   Clear to auscultation bilaterally.  No Wheezing or Rhonchi. No rales.  Chest wall:  No tenderness or deformity. No Accessory muscle use.  Heart:   Regular rate and rhythm,  no murmur,  rub or gallop.  Abdomen:   Soft, non-tender. Not distended.  Bowel sounds normal. No masses  Extremities: Extremities normal, atraumatic, No cyanosis.  No edema. No clubbing  Skin:     Texture, turgor normal. No rashes or lesions.  Not Jaundiced  Lymph nodes: Cervical, supraclavicular normal.  Psych:  Good insight.  Not depressed.  Not anxious or agitated.      NEUROLOGICAL EXAM:  Appearance:  The patient is well developed, well nourished, provides  and is in no acute distress.   Mental Status: Oriented to time, place and person. Mood and affect appropriate.   Cranial Nerves:   Intact visual fields. Fundi are benign. PERLA, EOM's full, no nystagmus, no ptosis. Facial sensation is normal. Corneal reflexes are intact. Facial movement is symmetric. Hearing is normal bilaterally. Palate is midline with normal sternocleidomastoid and trapezius muscles are normal. Tongue is midline.   Motor:  5-/5 strength in upper and lower proximal and distal muscles. Normal bulk and tone. No fasciculations.   Reflexes:   Deep tendon reflexes 3+/4 and symmetrical.   Sensory:    Dysesthesia to touch, pinprick and vibration.   Gait:   Slightly unsteady l gait.   Tremor:    Mild tremor noted.   Cerebellar:  No cerebellar signs present.   Neurovascular:  Normal heart sounds and regular rhythm, peripheral pulses intact, and no carotid bruits.       Assesment  1. Seizure (HCC)    - lacosamide (VIMPAT) 100 mg tab tablet; TAKE 1 TABLET BY  MOUTH TWICE DAILY  Dispense: 60 Tab; Refill: 1    2. Hypersomnia    - methylphenidate HCl (RITALIN) 5 mg tablet; Take 1 Tab by mouth two (2) times a day. Max Daily Amount: 10 mg.  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 Max Daily Amount: 10 mg.  Dispense: 60 Tab; Refill: 0    4. Partial symptomatic epilepsy with complex partial seizures, not intractable, without status  epilepticus (HCC)  Continue current management    5. Tremor  Continue management    ___________________________________________________  PLAN: Medication and plan discussed with patient's daughter      ICD-10-CM ICD-9-CM    1. Partial symptomatic epilepsy with complex partial seizures, not intractable, without status epilepticus (HCC) G40.209 345.40    2. Seizure (HCC) R56.9 780.39 lacosamide (VIMPAT) 100 mg tab tablet   3. Hypersomnia G47.10 780.54 methylphenidate HCl (RITALIN) 5 mg tablet   4. Attention deficit disorder (ADD) without hyperactivity F98.8 314.00 methylphenidate HCl (RITALIN) 5 mg tablet   5. Tremor R25.1 781.0      Follow-up and Dispositions    ?? Return in about 3 months (around 08/14/2018).               ___________________________________________________    Attending Physician: Forrestine Him, MD

## 2018-05-15 ENCOUNTER — Inpatient Hospital Stay
Admit: 2018-05-15 | Discharge: 2018-05-16 | Disposition: A | Discharge status: Home / Self Care | Payer: MEDICARE | Attending: Emergency Medicine

## 2018-05-15 ENCOUNTER — Emergency Department: Admit: 2018-05-16 | Payer: MEDICARE | Primary: Internal Medicine

## 2018-05-15 ENCOUNTER — Emergency Department: Admit: 2018-05-15 | Payer: MEDICARE | Primary: Internal Medicine

## 2018-05-15 ENCOUNTER — Encounter: Payer: Self-pay | Admitting: Neurology

## 2018-05-15 DIAGNOSIS — G40909 Epilepsy, unspecified, not intractable, without status epilepticus: Secondary | ICD-10-CM

## 2018-05-15 DIAGNOSIS — R059 Cough, unspecified: Secondary | ICD-10-CM

## 2018-05-15 MED ORDER — PRAMIPEXOLE 1 MG TAB
1 mg | ORAL_TABLET | ORAL | 1 refills | Status: DC
Start: 2018-05-15 — End: 2018-05-16

## 2018-05-15 NOTE — Telephone Encounter (Signed)
Completed per Dr.Aralu

## 2018-05-15 NOTE — ED Notes (Addendum)
Assumed care of patient from Molly, RN at this time.     Pt to CT at this time.

## 2018-05-15 NOTE — ED Notes (Deleted)
Pt to CT at this time

## 2018-05-15 NOTE — ED Notes (Signed)
Assumed care of patient from [place], RN at this time.     Pt to CT at this time.

## 2018-05-16 ENCOUNTER — Inpatient Hospital Stay
Admit: 2018-05-16 | Discharge: 2018-05-16 | Disposition: A | Discharge status: Home / Self Care | Payer: MEDICARE | Attending: Emergency Medicine

## 2018-05-16 DIAGNOSIS — R112 Nausea with vomiting, unspecified: Secondary | ICD-10-CM

## 2018-05-16 LAB — CBC WITH AUTOMATED DIFF
ABS. BASOPHILS: 0.1 10*3/uL (ref 0.0–0.1)
ABS. BASOPHILS: 0.1 10*3/uL (ref 0.0–0.1)
ABS. EOSINOPHILS: 0.1 10*3/uL (ref 0.0–0.4)
ABS. EOSINOPHILS: 0.1 10*3/uL (ref 0.0–0.4)
ABS. IMM. GRANS.: 0 10*3/uL (ref 0.00–0.04)
ABS. IMM. GRANS.: 0 10*3/uL (ref 0.00–0.04)
ABS. LYMPHOCYTES: 1.6 10*3/uL (ref 0.8–3.5)
ABS. LYMPHOCYTES: 1.3 10*3/uL (ref 0.8–3.5)
ABS. MONOCYTES: 0.6 10*3/uL (ref 0.0–1.0)
ABS. MONOCYTES: 0.7 10*3/uL (ref 0.0–1.0)
ABS. NEUTROPHILS: 4.9 10*3/uL (ref 1.8–8.0)
ABS. NEUTROPHILS: 4.3 10*3/uL (ref 1.8–8.0)
ABSOLUTE NRBC: 0 10*3/uL (ref 0.00–0.01)
ABSOLUTE NRBC: 0 10*3/uL (ref 0.00–0.01)
BASOPHILS: 1 % (ref 0–1)
BASOPHILS: 1 % (ref 0–1)
EOSINOPHILS: 2 % (ref 0–7)
EOSINOPHILS: 2 % (ref 0–7)
HCT: 41.9 % (ref 35.0–47.0)
HCT: 38.9 % (ref 35.0–47.0)
HGB: 13.5 g/dL (ref 11.5–16.0)
HGB: 12.1 g/dL (ref 11.5–16.0)
IMMATURE GRANULOCYTES: 0 % (ref 0.0–0.5)
IMMATURE GRANULOCYTES: 0 % (ref 0.0–0.5)
LYMPHOCYTES: 21 % (ref 12–49)
LYMPHOCYTES: 21 % (ref 12–49)
MCH: 31.1 PG (ref 26.0–34.0)
MCH: 30.9 PG (ref 26.0–34.0)
MCHC: 32.2 g/dL (ref 30.0–36.5)
MCHC: 31.1 g/dL (ref 30.0–36.5)
MCV: 96.5 FL (ref 80.0–99.0)
MCV: 99.5 FL — ABNORMAL HIGH (ref 80.0–99.0)
MONOCYTES: 9 % (ref 5–13)
MONOCYTES: 10 % (ref 5–13)
MPV: 10.4 FL (ref 8.9–12.9)
MPV: 11 FL (ref 8.9–12.9)
NEUTROPHILS: 67 % (ref 32–75)
NEUTROPHILS: 66 % (ref 32–75)
NRBC: 0 PER 100 WBC
NRBC: 0 PER 100 WBC
PLATELET: 484 10*3/uL — ABNORMAL HIGH (ref 150–400)
PLATELET: 365 10*3/uL (ref 150–400)
RBC: 4.34 M/uL (ref 3.80–5.20)
RBC: 3.91 M/uL (ref 3.80–5.20)
RDW: 14.1 % (ref 11.5–14.5)
RDW: 14.1 % (ref 11.5–14.5)
WBC: 7.3 10*3/uL (ref 3.6–11.0)
WBC: 6.4 10*3/uL (ref 3.6–11.0)

## 2018-05-16 LAB — METABOLIC PANEL, COMPREHENSIVE
A-G Ratio: 1.1 (ref 1.1–2.2)
A-G Ratio: 1.1 (ref 1.1–2.2)
ALT (SGPT): 17 U/L (ref 12–78)
ALT (SGPT): 15 U/L (ref 12–78)
AST (SGOT): 22 U/L (ref 15–37)
AST (SGOT): 19 U/L (ref 15–37)
Albumin: 4 g/dL (ref 3.5–5.0)
Albumin: 3.9 g/dL (ref 3.5–5.0)
Alk. phosphatase: 53 U/L (ref 45–117)
Alk. phosphatase: 50 U/L (ref 45–117)
Anion gap: 4 mmol/L — ABNORMAL LOW (ref 5–15)
Anion gap: 7 mmol/L (ref 5–15)
BUN/Creatinine ratio: 10 — ABNORMAL LOW (ref 12–20)
BUN/Creatinine ratio: 7 — ABNORMAL LOW (ref 12–20)
BUN: 12 MG/DL (ref 6–20)
BUN: 8 MG/DL (ref 6–20)
Bilirubin, total: 0.3 MG/DL (ref 0.2–1.0)
Bilirubin, total: 0.4 MG/DL (ref 0.2–1.0)
CO2: 30 mmol/L (ref 21–32)
CO2: 27 mmol/L (ref 21–32)
Calcium: 9 MG/DL (ref 8.5–10.1)
Calcium: 9.6 MG/DL (ref 8.5–10.1)
Chloride: 107 mmol/L (ref 97–108)
Chloride: 106 mmol/L (ref 97–108)
Creatinine: 1.26 MG/DL — ABNORMAL HIGH (ref 0.55–1.02)
Creatinine: 1.09 MG/DL — ABNORMAL HIGH (ref 0.55–1.02)
GFR est AA: 51 mL/min/{1.73_m2} — ABNORMAL LOW (ref >60)
GFR est AA: >60 mL/min/{1.73_m2} (ref >60)
GFR est non-AA: 42 mL/min/{1.73_m2} — ABNORMAL LOW (ref >60)
GFR est non-AA: 50 mL/min/{1.73_m2} — ABNORMAL LOW (ref >60)
Globulin: 3.7 g/dL (ref 2.0–4.0)
Globulin: 3.6 g/dL (ref 2.0–4.0)
Glucose: 103 mg/dL — ABNORMAL HIGH (ref 65–100)
Glucose: 97 mg/dL (ref 65–100)
Potassium: 4.1 mmol/L (ref 3.5–5.1)
Potassium: 4.4 mmol/L (ref 3.5–5.1)
Protein, total: 7.7 g/dL (ref 6.4–8.2)
Protein, total: 7.5 g/dL (ref 6.4–8.2)
Sodium: 141 mmol/L (ref 136–145)
Sodium: 140 mmol/L (ref 136–145)

## 2018-05-16 LAB — URINALYSIS W/ REFLEX CULTURE
BACTERIA, URINE: NEGATIVE /hpf
Bacteria: NEGATIVE /hpf
Bilirubin, Urine: NEGATIVE
Bilirubin: NEGATIVE
Blood, Urine: NEGATIVE
Blood: NEGATIVE
Glucose, Ur: NEGATIVE mg/dL
Glucose: NEGATIVE mg/dL
Ketone: NEGATIVE mg/dL
Ketones, Urine: NEGATIVE mg/dL
Nitrite, Urine: NEGATIVE
Nitrites: NEGATIVE
Protein, UA: NEGATIVE mg/dL
Protein: NEGATIVE mg/dL
Specific Gravity, UA: 1.01 (ref 1.003–1.030)
Specific gravity: 1.01 (ref 1.003–1.030)
Urobilinogen, UA, POCT: 1 EU/dL (ref 0.2–1.0)
Urobilinogen: 1 EU/dL (ref 0.2–1.0)
pH (UA): 7.5 (ref 5.0–8.0)
pH, UA: 7.5 (ref 5.0–8.0)

## 2018-05-16 LAB — EKG, 12 LEAD, INITIAL
Atrial Rate: 69 {beats}/min
Calculated P Axis: 69 degrees
Calculated R Axis: 41 degrees
Calculated T Axis: 60 degrees
Diagnosis: NORMAL
P-R Interval: 158 ms
Q-T Interval: 412 ms
QRS Duration: 78 ms
QTC Calculation (Bezet): 441 ms
Ventricular Rate: 69 {beats}/min

## 2018-05-16 LAB — VALPROIC ACID
Valproic Acid: 8 ug/ml — ABNORMAL LOW (ref 50–100)
Valproic acid: 8 ug/ml — ABNORMAL LOW (ref 50–100)

## 2018-05-16 LAB — TROPONIN I: Troponin-I, Qt.: <0.05 ng/mL (ref <0.05)

## 2018-05-16 LAB — INFLUENZA A & B AG (RAPID TEST)
Influenza A Antigen: NEGATIVE
Influenza B Antigen: NEGATIVE

## 2018-05-16 LAB — LIPASE
Lipase: 109 U/L (ref 73–393)
Lipase: 109 U/L (ref 73–393)

## 2018-05-16 LAB — MAGNESIUM
Magnesium: 2 mg/dL (ref 1.6–2.4)
Magnesium: 2 mg/dL (ref 1.6–2.4)

## 2018-05-16 LAB — COMPREHENSIVE METABOLIC PANEL
ALT: 17 U/L (ref 12–78)
ALT: 15 U/L (ref 12–78)
AST: 22 U/L (ref 15–37)
AST: 19 U/L (ref 15–37)
Albumin/Globulin Ratio: 1.1 (ref 1.1–2.2)
Albumin/Globulin Ratio: 1.1 (ref 1.1–2.2)
Albumin: 4 g/dL (ref 3.5–5.0)
Albumin: 3.9 g/dL (ref 3.5–5.0)
Alkaline Phosphatase: 53 U/L (ref 45–117)
Alkaline Phosphatase: 50 U/L (ref 45–117)
Anion Gap: 4 mmol/L — ABNORMAL LOW (ref 5–15)
Anion Gap: 7 mmol/L (ref 5–15)
BUN: 12 MG/DL (ref 6–20)
BUN: 8 MG/DL (ref 6–20)
Bun/Cre Ratio: 10 — ABNORMAL LOW (ref 12–20)
Bun/Cre Ratio: 7 — ABNORMAL LOW (ref 12–20)
CO2: 30 mmol/L (ref 21–32)
CO2: 27 mmol/L (ref 21–32)
Calcium: 9 MG/DL (ref 8.5–10.1)
Calcium: 9.6 MG/DL (ref 8.5–10.1)
Chloride: 107 mmol/L (ref 97–108)
Chloride: 106 mmol/L (ref 97–108)
Creatinine: 1.26 MG/DL — ABNORMAL HIGH (ref 0.55–1.02)
Creatinine: 1.09 MG/DL — ABNORMAL HIGH (ref 0.55–1.02)
EGFR IF NonAfrican American: 42 mL/min/{1.73_m2} — ABNORMAL LOW (ref >60)
EGFR IF NonAfrican American: 50 mL/min/{1.73_m2} — ABNORMAL LOW (ref >60)
GFR African American: 51 mL/min/{1.73_m2} — ABNORMAL LOW (ref >60)
GFR African American: >60 mL/min/{1.73_m2} (ref >60)
Globulin: 3.7 g/dL (ref 2.0–4.0)
Globulin: 3.6 g/dL (ref 2.0–4.0)
Glucose: 103 mg/dL — ABNORMAL HIGH (ref 65–100)
Glucose: 97 mg/dL (ref 65–100)
Potassium: 4.1 mmol/L (ref 3.5–5.1)
Potassium: 4.4 mmol/L (ref 3.5–5.1)
Sodium: 141 mmol/L (ref 136–145)
Sodium: 140 mmol/L (ref 136–145)
Total Bilirubin: 0.3 MG/DL (ref 0.2–1.0)
Total Bilirubin: 0.4 MG/DL (ref 0.2–1.0)
Total Protein: 7.7 g/dL (ref 6.4–8.2)
Total Protein: 7.5 g/dL (ref 6.4–8.2)

## 2018-05-16 LAB — EKG 12-LEAD
Atrial Rate: 69 {beats}/min
Diagnosis: NORMAL
P Axis: 69 degrees
P-R Interval: 158 ms
Q-T Interval: 412 ms
QRS Duration: 78 ms
QTc Calculation (Bazett): 441 ms
R Axis: 41 degrees
T Axis: 60 degrees
Ventricular Rate: 69 {beats}/min

## 2018-05-16 LAB — CBC WITH AUTO DIFFERENTIAL
Basophils %: 1 % (ref 0–1)
Basophils %: 1 % (ref 0–1)
Basophils Absolute: 0.1 10*3/uL (ref 0.0–0.1)
Basophils Absolute: 0.1 10*3/uL (ref 0.0–0.1)
Eosinophils %: 2 % (ref 0–7)
Eosinophils %: 2 % (ref 0–7)
Eosinophils Absolute: 0.1 10*3/uL (ref 0.0–0.4)
Eosinophils Absolute: 0.1 10*3/uL (ref 0.0–0.4)
Granulocyte Absolute Count: 0 10*3/uL (ref 0.00–0.04)
Granulocyte Absolute Count: 0 10*3/uL (ref 0.00–0.04)
Hematocrit: 41.9 % (ref 35.0–47.0)
Hematocrit: 38.9 % (ref 35.0–47.0)
Hemoglobin: 13.5 g/dL (ref 11.5–16.0)
Hemoglobin: 12.1 g/dL (ref 11.5–16.0)
Immature Granulocytes: 0 % (ref 0.0–0.5)
Immature Granulocytes: 0 % (ref 0.0–0.5)
Lymphocytes %: 21 % (ref 12–49)
Lymphocytes %: 21 % (ref 12–49)
Lymphocytes Absolute: 1.6 10*3/uL (ref 0.8–3.5)
Lymphocytes Absolute: 1.3 10*3/uL (ref 0.8–3.5)
MCH: 31.1 PG (ref 26.0–34.0)
MCH: 30.9 PG (ref 26.0–34.0)
MCHC: 32.2 g/dL (ref 30.0–36.5)
MCHC: 31.1 g/dL (ref 30.0–36.5)
MCV: 96.5 FL (ref 80.0–99.0)
MCV: 99.5 FL — ABNORMAL HIGH (ref 80.0–99.0)
MPV: 10.4 FL (ref 8.9–12.9)
MPV: 11 FL (ref 8.9–12.9)
Monocytes %: 9 % (ref 5–13)
Monocytes %: 10 % (ref 5–13)
Monocytes Absolute: 0.6 10*3/uL (ref 0.0–1.0)
Monocytes Absolute: 0.7 10*3/uL (ref 0.0–1.0)
NRBC Absolute: 0 10*3/uL (ref 0.00–0.01)
NRBC Absolute: 0 10*3/uL (ref 0.00–0.01)
Neutrophils %: 67 % (ref 32–75)
Neutrophils %: 66 % (ref 32–75)
Neutrophils Absolute: 4.9 10*3/uL (ref 1.8–8.0)
Neutrophils Absolute: 4.3 10*3/uL (ref 1.8–8.0)
Nucleated RBCs: 0 PER 100 WBC
Nucleated RBCs: 0 PER 100 WBC
Platelets: 484 10*3/uL — ABNORMAL HIGH (ref 150–400)
Platelets: 365 10*3/uL (ref 150–400)
RBC: 4.34 M/uL (ref 3.80–5.20)
RBC: 3.91 M/uL (ref 3.80–5.20)
RDW: 14.1 % (ref 11.5–14.5)
RDW: 14.1 % (ref 11.5–14.5)
WBC: 7.3 10*3/uL (ref 3.6–11.0)
WBC: 6.4 10*3/uL (ref 3.6–11.0)

## 2018-05-16 LAB — RAPID INFLUENZA A/B ANTIGENS
Flu A Antigen: NEGATIVE
Influenza B Antigen: NEGATIVE

## 2018-05-16 LAB — TROPONIN: Troponin I: <0.05 ng/mL (ref <0.05)

## 2018-05-16 MED ORDER — IOPAMIDOL 76 % IV SOLN
370 mg iodine /mL (76 %) | Freq: Once | INTRAVENOUS | Status: AC
Start: 2018-05-16 — End: 2018-05-15
  Administered 2018-05-16: 04:00:00 via INTRAVENOUS

## 2018-05-16 MED ORDER — ONDANSETRON 4 MG TAB, RAPID DISSOLVE
4 mg | ORAL | Status: AC
Start: 2018-05-16 — End: 2018-05-15
  Administered 2018-05-16: 01:00:00 via ORAL

## 2018-05-16 MED ORDER — SODIUM CHLORIDE 0.9% BOLUS IV
0.9 % | INTRAVENOUS | Status: AC
Start: 2018-05-16 — End: 2018-05-16
  Administered 2018-05-16: 01:00:00 via INTRAVENOUS

## 2018-05-16 MED ORDER — BENZONATATE 100 MG CAP
100 mg | ORAL_CAPSULE | Freq: Three times a day (TID) | ORAL | 0 refills | Status: DC | PRN
Start: 2018-05-16 — End: 2018-05-23

## 2018-05-16 MED ORDER — SODIUM CHLORIDE 0.9 % IJ SYRG
Freq: Once | INTRAMUSCULAR | Status: AC
Start: 2018-05-16 — End: 2018-05-15
  Administered 2018-05-16: 04:00:00 via INTRAVENOUS

## 2018-05-16 MED FILL — ISOVUE-370  76 % INTRAVENOUS SOLUTION: 370 mg iodine /mL (76 %) | INTRAVENOUS | Qty: 100

## 2018-05-16 MED FILL — SODIUM CHLORIDE 0.9 % IV: INTRAVENOUS | Qty: 500

## 2018-05-16 MED FILL — ONDANSETRON 4 MG TAB, RAPID DISSOLVE: 4 mg | ORAL | Qty: 1
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
## 2018-05-16 NOTE — ED Notes (Addendum)
Dr Knorr has reviewed discharge instructions with the patient.  The patient verbalized understanding.    Pt wheeled out of ED with papers in the care of family.

## 2018-05-16 NOTE — ED Notes (Signed)
Pt tolerated PO challenge well.
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
## 2018-05-16 NOTE — ED Triage Notes (Signed)
Pt arrived via EMS. Per family pt had an episode of vomiting and then went unresponsive for about 5 minutes. Per EMS pt had multiple syncopal episodes on the way here. Pt vomited on arrival.

## 2018-05-16 NOTE — ED Notes (Signed)
Provider reviewed discharge instructions with the patient.  The patient verbalized understanding. Pt ambulated out of ED.

## 2018-05-16 NOTE — ED Notes (Signed)
Dr Lourdes Sledge has reviewed discharge instructions with the patient.  The patient verbalized understanding.    Pt wheeled out of ED with papers in the care of family.
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
## 2018-05-16 NOTE — ED Notes (Signed)
Pt arrived via EMS. Per family pt had an episode of vomiting and then went unresponsive for about 5 minutes. Per EMS pt had multiple syncopal episodes on the way here. Pt vomited on arrival.

## 2018-05-17 LAB — CULTURE, URINE
Colonies Counted: >100000
Colony Count: >100000

## 2018-05-18 ENCOUNTER — Emergency Department: Admit: 2018-05-18 | Payer: MEDICARE | Primary: Internal Medicine

## 2018-05-18 ENCOUNTER — Inpatient Hospital Stay
Admit: 2018-05-18 | Discharge: 2018-05-23 | Disposition: A | Discharge status: Skilled Nursing Facility | Payer: MEDICARE | Attending: Internal Medicine | Admitting: Internal Medicine

## 2018-05-18 LAB — CBC WITH AUTOMATED DIFF
ABS. BASOPHILS: 0 10*3/uL (ref 0.0–0.1)
ABS. EOSINOPHILS: 0 10*3/uL (ref 0.0–0.4)
ABS. IMM. GRANS.: 0 10*3/uL (ref 0.00–0.04)
ABS. LYMPHOCYTES: 1.3 10*3/uL (ref 0.8–3.5)
ABS. MONOCYTES: 0.4 10*3/uL (ref 0.0–1.0)
ABS. NEUTROPHILS: 3.3 10*3/uL (ref 1.8–8.0)
ABSOLUTE NRBC: 0 10*3/uL (ref 0.00–0.01)
BASOPHILS: 1 % (ref 0–1)
EOSINOPHILS: 0 % (ref 0–7)
HCT: 40.5 % (ref 35.0–47.0)
HGB: 12.9 g/dL (ref 11.5–16.0)
IMMATURE GRANULOCYTES: 0 % (ref 0.0–0.5)
LYMPHOCYTES: 26 % (ref 12–49)
MCH: 31.2 PG (ref 26.0–34.0)
MCHC: 31.9 g/dL (ref 30.0–36.5)
MCV: 97.8 FL (ref 80.0–99.0)
MONOCYTES: 8 % (ref 5–13)
MPV: 10.4 FL (ref 8.9–12.9)
NEUTROPHILS: 65 % (ref 32–75)
NRBC: 0 PER 100 WBC
PLATELET: 501 10*3/uL — ABNORMAL HIGH (ref 150–400)
RBC: 4.14 M/uL (ref 3.80–5.20)
RDW: 14 % (ref 11.5–14.5)
WBC: 5.1 10*3/uL (ref 3.6–11.0)

## 2018-05-18 LAB — METABOLIC PANEL, COMPREHENSIVE
A-G Ratio: 1 — ABNORMAL LOW (ref 1.1–2.2)
ALT (SGPT): 18 U/L (ref 12–78)
AST (SGOT): 26 U/L (ref 15–37)
Albumin: 4 g/dL (ref 3.5–5.0)
Alk. phosphatase: 57 U/L (ref 45–117)
Anion gap: 4 mmol/L — ABNORMAL LOW (ref 5–15)
BUN/Creatinine ratio: 7 — ABNORMAL LOW (ref 12–20)
BUN: 9 MG/DL (ref 6–20)
Bilirubin, total: 0.5 MG/DL (ref 0.2–1.0)
CO2: 28 mmol/L (ref 21–32)
Calcium: 9.3 MG/DL (ref 8.5–10.1)
Chloride: 107 mmol/L (ref 97–108)
Creatinine: 1.32 MG/DL — ABNORMAL HIGH (ref 0.55–1.02)
GFR est AA: 48 mL/min/{1.73_m2} — ABNORMAL LOW (ref >60)
GFR est non-AA: 40 mL/min/{1.73_m2} — ABNORMAL LOW (ref >60)
Globulin: 4.2 g/dL — ABNORMAL HIGH (ref 2.0–4.0)
Glucose: 99 mg/dL (ref 65–100)
Potassium: 4.7 mmol/L (ref 3.5–5.1)
Protein, total: 8.2 g/dL (ref 6.4–8.2)
Sodium: 139 mmol/L (ref 136–145)

## 2018-05-18 LAB — URINALYSIS W/ REFLEX CULTURE
BACTERIA, URINE: NEGATIVE /hpf
Bacteria: NEGATIVE /hpf
Bilirubin, Urine: NEGATIVE
Bilirubin: NEGATIVE
Blood, Urine: NEGATIVE
Blood: NEGATIVE
Glucose, Ur: NEGATIVE mg/dL
Glucose: NEGATIVE mg/dL
Ketone: NEGATIVE mg/dL
Ketones, Urine: NEGATIVE mg/dL
Leukocyte Esterase, Urine: NEGATIVE
Leukocyte Esterase: NEGATIVE
Nitrite, Urine: NEGATIVE
Nitrites: NEGATIVE
Protein, UA: NEGATIVE mg/dL
Protein: NEGATIVE mg/dL
Specific Gravity, UA: 1.012 (ref 1.003–1.030)
Specific gravity: 1.012 (ref 1.003–1.030)
Urobilinogen, UA, POCT: 1 EU/dL (ref 0.2–1.0)
Urobilinogen: 1 EU/dL (ref 0.2–1.0)
pH (UA): 7 (ref 5.0–8.0)
pH, UA: 7 (ref 5.0–8.0)

## 2018-05-18 LAB — EKG, 12 LEAD, INITIAL
Atrial Rate: 80 {beats}/min
Calculated P Axis: 57 degrees
Calculated R Axis: 24 degrees
Calculated T Axis: 42 degrees
Diagnosis: NORMAL
P-R Interval: 150 ms
Q-T Interval: 378 ms
QRS Duration: 70 ms
QTC Calculation (Bezet): 435 ms
Ventricular Rate: 80 {beats}/min

## 2018-05-18 LAB — GLUCOSE, POC
Glucose (POC): 70 mg/dL (ref 65–100)
Glucose (POC): 89 mg/dL (ref 65–100)

## 2018-05-18 LAB — VALPROIC ACID
Valproic Acid: 30 ug/ml — ABNORMAL LOW (ref 50–100)
Valproic acid: 30 ug/ml — ABNORMAL LOW (ref 50–100)

## 2018-05-18 LAB — AMMONIA
Ammonia, plasma: <10 umol/L (ref <32)
Ammonia: <10 umol/L (ref <32)

## 2018-05-18 LAB — POCT GLUCOSE
POC Glucose: 70 mg/dL (ref 65–100)
POC Glucose: 89 mg/dL (ref 65–100)

## 2018-05-18 LAB — CBC WITH AUTO DIFFERENTIAL
Basophils %: 1 % (ref 0–1)
Basophils Absolute: 0 10*3/uL (ref 0.0–0.1)
Eosinophils %: 0 % (ref 0–7)
Eosinophils Absolute: 0 10*3/uL (ref 0.0–0.4)
Granulocyte Absolute Count: 0 10*3/uL (ref 0.00–0.04)
Hematocrit: 40.5 % (ref 35.0–47.0)
Hemoglobin: 12.9 g/dL (ref 11.5–16.0)
Immature Granulocytes: 0 % (ref 0.0–0.5)
Lymphocytes %: 26 % (ref 12–49)
Lymphocytes Absolute: 1.3 10*3/uL (ref 0.8–3.5)
MCH: 31.2 PG (ref 26.0–34.0)
MCHC: 31.9 g/dL (ref 30.0–36.5)
MCV: 97.8 FL (ref 80.0–99.0)
MPV: 10.4 FL (ref 8.9–12.9)
Monocytes %: 8 % (ref 5–13)
Monocytes Absolute: 0.4 10*3/uL (ref 0.0–1.0)
NRBC Absolute: 0 10*3/uL (ref 0.00–0.01)
Neutrophils %: 65 % (ref 32–75)
Neutrophils Absolute: 3.3 10*3/uL (ref 1.8–8.0)
Nucleated RBCs: 0 PER 100 WBC
Platelets: 501 10*3/uL — ABNORMAL HIGH (ref 150–400)
RBC: 4.14 M/uL (ref 3.80–5.20)
RDW: 14 % (ref 11.5–14.5)
WBC: 5.1 10*3/uL (ref 3.6–11.0)

## 2018-05-18 LAB — EKG 12-LEAD
Atrial Rate: 80 {beats}/min
Diagnosis: NORMAL
P Axis: 57 degrees
P-R Interval: 150 ms
Q-T Interval: 378 ms
QRS Duration: 70 ms
QTc Calculation (Bazett): 435 ms
R Axis: 24 degrees
T Axis: 42 degrees
Ventricular Rate: 80 {beats}/min

## 2018-05-18 LAB — COMPREHENSIVE METABOLIC PANEL
ALT: 18 U/L (ref 12–78)
AST: 26 U/L (ref 15–37)
Albumin/Globulin Ratio: 1 — ABNORMAL LOW (ref 1.1–2.2)
Albumin: 4 g/dL (ref 3.5–5.0)
Alkaline Phosphatase: 57 U/L (ref 45–117)
Anion Gap: 4 mmol/L — ABNORMAL LOW (ref 5–15)
BUN: 9 MG/DL (ref 6–20)
Bun/Cre Ratio: 7 — ABNORMAL LOW (ref 12–20)
CO2: 28 mmol/L (ref 21–32)
Calcium: 9.3 MG/DL (ref 8.5–10.1)
Chloride: 107 mmol/L (ref 97–108)
Creatinine: 1.32 MG/DL — ABNORMAL HIGH (ref 0.55–1.02)
EGFR IF NonAfrican American: 40 mL/min/{1.73_m2} — ABNORMAL LOW (ref >60)
GFR African American: 48 mL/min/{1.73_m2} — ABNORMAL LOW (ref >60)
Globulin: 4.2 g/dL — ABNORMAL HIGH (ref 2.0–4.0)
Glucose: 99 mg/dL (ref 65–100)
Potassium: 4.7 mmol/L (ref 3.5–5.1)
Sodium: 139 mmol/L (ref 136–145)
Total Bilirubin: 0.5 MG/DL (ref 0.2–1.0)
Total Protein: 8.2 g/dL (ref 6.4–8.2)

## 2018-05-18 MED ORDER — ENOXAPARIN 40 MG/0.4 ML SUB-Q SYRINGE
40 mg/0.4 mL | SUBCUTANEOUS | Status: DC
Start: 2018-05-18 — End: 2018-05-23
  Administered 2018-05-18 – 2018-05-22 (×5): via SUBCUTANEOUS

## 2018-05-18 MED ORDER — DIVALPROEX 250 MG TAB, DELAYED RELEASE
250 mg | Freq: Two times a day (BID) | ORAL | Status: DC
Start: 2018-05-18 — End: 2018-05-23
  Administered 2018-05-18 – 2018-05-23 (×11): via ORAL

## 2018-05-18 MED ORDER — NITROGLYCERIN 2 % TRANSDERMAL OINTMENT
2 % | Freq: Four times a day (QID) | TRANSDERMAL | Status: DC | PRN
Start: 2018-05-18 — End: 2018-05-23

## 2018-05-18 MED ORDER — ASPIRIN 81 MG CHEWABLE TAB
81 mg | Freq: Every day | ORAL | Status: DC
Start: 2018-05-18 — End: 2018-05-23
  Administered 2018-05-19 – 2018-05-23 (×5): via ORAL

## 2018-05-18 MED ORDER — DONEPEZIL 5 MG TAB
5 mg | Freq: Every day | ORAL | Status: DC
Start: 2018-05-18 — End: 2018-05-23
  Administered 2018-05-19 – 2018-05-23 (×5): via ORAL

## 2018-05-18 MED ORDER — CITALOPRAM 20 MG TAB
20 mg | Freq: Every day | ORAL | Status: DC
Start: 2018-05-18 — End: 2018-05-23
  Administered 2018-05-19 – 2018-05-23 (×5): via ORAL

## 2018-05-18 MED ORDER — FLUTICASONE 50 MCG/ACTUATION NASAL SPRAY, SUSP
50 mcg/actuation | Freq: Every day | NASAL | Status: DC
Start: 2018-05-18 — End: 2018-05-23
  Administered 2018-05-19 – 2018-05-22 (×4): via NASAL

## 2018-05-18 MED ORDER — LORAZEPAM 2 MG/ML IJ SOLN
2 mg/mL | INTRAMUSCULAR | Status: AC
Start: 2018-05-18 — End: 2018-05-18
  Administered 2018-05-18: 13:00:00 via INTRAVENOUS

## 2018-05-18 MED ORDER — ONDANSETRON (PF) 4 MG/2 ML INJECTION
4 mg/2 mL | Freq: Four times a day (QID) | INTRAMUSCULAR | Status: DC | PRN
Start: 2018-05-18 — End: 2018-05-23

## 2018-05-18 MED ORDER — LACOSAMIDE 50 MG TAB
50 mg | Freq: Two times a day (BID) | ORAL | Status: DC
Start: 2018-05-18 — End: 2018-05-23
  Administered 2018-05-18 – 2018-05-23 (×11): via ORAL

## 2018-05-18 MED ORDER — SODIUM CHLORIDE 0.9 % IJ SYRG
INTRAMUSCULAR | Status: DC | PRN
Start: 2018-05-18 — End: 2018-05-23
  Administered 2018-05-19 – 2018-05-20 (×2): via INTRAVENOUS

## 2018-05-18 MED ORDER — MEMANTINE 10 MG TAB
10 mg | Freq: Two times a day (BID) | ORAL | Status: DC
Start: 2018-05-18 — End: 2018-05-23
  Administered 2018-05-19 – 2018-05-23 (×10): via ORAL

## 2018-05-18 MED ORDER — SODIUM CHLORIDE 0.9 % IV
INTRAVENOUS | Status: AC
Start: 2018-05-18 — End: 2018-05-19
  Administered 2018-05-18 – 2018-05-19 (×3): via INTRAVENOUS

## 2018-05-18 MED ORDER — VALPROATE SODIUM 100 MG/ML IV
500 mg/5 mL (100 mg/mL) | Freq: Once | INTRAVENOUS | Status: AC
Start: 2018-05-18 — End: 2018-05-18
  Administered 2018-05-18: 20:00:00 via INTRAVENOUS

## 2018-05-18 MED ORDER — TOPIRAMATE 100 MG TAB
100 mg | Freq: Every day | ORAL | Status: DC
Start: 2018-05-18 — End: 2018-05-23
  Administered 2018-05-19 – 2018-05-23 (×5): via ORAL

## 2018-05-18 MED ORDER — DONEPEZIL 10 MG TAB
10 mg | ORAL_TABLET | ORAL | 0 refills | Status: AC
Start: 2018-05-18 — End: ?

## 2018-05-18 MED ORDER — HYDRALAZINE 25 MG TAB
25 mg | Freq: Three times a day (TID) | ORAL | Status: DC | PRN
Start: 2018-05-18 — End: 2018-05-23

## 2018-05-18 MED ORDER — LORAZEPAM 2 MG/ML IJ SOLN
2 mg/mL | INTRAMUSCULAR | Status: DC | PRN
Start: 2018-05-18 — End: 2018-05-23

## 2018-05-18 MED ORDER — FENOFIBRATE NANOCRYSTALLIZED 48 MG TAB
48 mg | Freq: Every day | ORAL | Status: DC
Start: 2018-05-18 — End: 2018-05-23
  Administered 2018-05-19 – 2018-05-23 (×5): via ORAL

## 2018-05-18 MED ORDER — VALPROATE SODIUM 100 MG/ML IV
5005100 mg/5 mL (100 mg/mL) | Freq: Once | INTRAVENOUS | Status: DC
Start: 2018-05-18 — End: 2018-05-18
  Administered 2018-05-18: 19:00:00 via INTRAVENOUS

## 2018-05-18 MED ORDER — ACETAMINOPHEN 325 MG TABLET
325 mg | Freq: Four times a day (QID) | ORAL | Status: DC | PRN
Start: 2018-05-18 — End: 2018-05-23

## 2018-05-18 MED ORDER — METHYLPHENIDATE 5 MG TAB
5 mg | Freq: Two times a day (BID) | ORAL | Status: DC
Start: 2018-05-18 — End: 2018-05-23
  Administered 2018-05-19 – 2018-05-23 (×10): via ORAL

## 2018-05-18 MED ORDER — DIVALPROEX 250 MG TAB, DELAYED RELEASE
250 mg | Freq: Every day | ORAL | Status: DC
Start: 2018-05-18 — End: 2018-05-18

## 2018-05-18 MED ORDER — LORAZEPAM 2 MG/ML IJ SOLN
2 mg/mL | INTRAMUSCULAR | Status: AC
Start: 2018-05-18 — End: 2018-05-18

## 2018-05-18 MED ORDER — SODIUM CHLORIDE 0.9 % IJ SYRG
Freq: Three times a day (TID) | INTRAMUSCULAR | Status: DC
Start: 2018-05-18 — End: 2018-05-23
  Administered 2018-05-18 – 2018-05-23 (×13): via INTRAVENOUS

## 2018-05-18 MED FILL — BD POSIFLUSH NORMAL SALINE 0.9 % INJECTION SYRINGE: INTRAMUSCULAR | Qty: 10

## 2018-05-18 MED FILL — VIMPAT 50 MG TABLET: 50 mg | ORAL | Qty: 2

## 2018-05-18 MED FILL — VALPROATE SODIUM 100 MG/ML IV: 500 mg/5 mL (100 mg/mL) | INTRAVENOUS | Qty: 5

## 2018-05-18 MED FILL — DEPAKOTE 250 MG TABLET,DELAYED RELEASE: 250 mg | ORAL | Qty: 2

## 2018-05-18 MED FILL — FLUTICASONE 50 MCG/ACTUATION NASAL SPRAY, SUSP: 50 mcg/actuation | NASAL | Qty: 16

## 2018-05-18 MED FILL — LORAZEPAM 2 MG/ML IJ SOLN: 2 mg/mL | INTRAMUSCULAR | Qty: 1

## 2018-05-18 MED FILL — ENOXAPARIN 40 MG/0.4 ML SUB-Q SYRINGE: 40 mg/0.4 mL | SUBCUTANEOUS | Qty: 0.4

## 2018-05-18 MED FILL — SODIUM CHLORIDE 0.9 % IV: INTRAVENOUS | Qty: 1000

## 2018-05-18 NOTE — Other (Signed)
TRANSFER - IN REPORT:    Verbal report received from lauren m(name) on Meghan [name]  being received from ED(unit) for routine progression of care      Report consisted of patient???s Situation, Background, Assessment and   Recommendations(SBAR).     Information from the following report(s) SBAR, Kardex, ED Summary, Intake/Output, MAR, Recent Results and Cardiac Rhythm NSR was reviewed with the receiving nurse.    Opportunity for questions and clarification was provided.      Assessment completed upon patient???s arrival to unit and care assumed.

## 2018-05-18 NOTE — Progress Notes (Signed)
Physical Therapy    Order received, chart reviewed, noted patient with active seizures with ativan this morning, spoke with Jonathan, RN who requested to hold, patient drowsy, postictal and not alert enough for medications. Will follow up tomorrow as able for evaluation.    Conya Ellinwood C. Trinika Cortese MSPT

## 2018-05-18 NOTE — ED Triage Notes (Addendum)
Assumed care of pt from EMS. Pt was found by sister this AM sitting on floor by bed. Sister reports that she is acting like she had a seizure. Pt has hx of seizures. Pt also has hx of dementia and HOH and is A&O x2 at baseline. BG per EMS 118Pt on monitor x3. Family at bedside. MD at bedside.
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
## 2018-05-18 NOTE — ED Notes (Signed)
Spoke with Case Management - they will come see the pt.

## 2018-05-18 NOTE — ED Notes (Signed)
Pt appears to be having a focal seizure. Room air SpO2 down to 80%. 2L O2 administered via NC. Verbal order received for Lorazepam. Will continue to monitor.

## 2018-05-18 NOTE — ED Notes (Signed)
Skin warm and dry. Respirations even and unlabored. In no apparent distress at this time. Alert and interacting appropriately. Transport at bedside to take pt to IP room via stretcher.

## 2018-05-18 NOTE — ED Notes (Signed)
Attempted to call report to IP RN - no answer.

## 2018-05-18 NOTE — Progress Notes (Signed)
Problem: Falls - Risk of  Goal: *Absence of Falls  Description  Document Bridgette HabermannSchmid Fall Risk and appropriate interventions in the flowsheet.  Outcome: Progressing Towards Goal  Note:   Fall Risk Interventions:       Mentation Interventions: Bed/chair exit alarm, Door open when patient unattended    Medication Interventions: Bed/chair exit alarm, Evaluate medications/consider consulting pharmacy, Patient to call before getting OOB    Elimination Interventions: Call light in reach    History of Falls Interventions: Bed/chair exit alarm         Problem: Patient Education: Go to Patient Education Activity  Goal: Patient/Family Education  Outcome: Progressing Towards Goal     Problem: Pressure Injury - Risk of  Goal: *Prevention of pressure injury  Description  Document Braden Scale and appropriate interventions in the flowsheet.  Outcome: Progressing Towards Goal  Note:   Pressure Injury Interventions:  Sensory Interventions: Assess changes in LOC    Moisture Interventions: Absorbent underpads, Check for incontinence Q2 hours and as needed    Activity Interventions: Increase time out of bed, Pressure redistribution bed/mattress(bed type), PT/OT evaluation    Mobility Interventions: Turn and reposition approx. every two hours(pillow and wedges), PT/OT evaluation    Nutrition Interventions: Document food/fluid/supplement intake    Friction and Shear Interventions: HOB 30 degrees or less, Lift sheet                Problem: Patient Education: Go to Patient Education Activity  Goal: Patient/Family Education  Outcome: Progressing Towards Goal     Problem: Seizure Disorder (Adult)  Goal: *STG: Remains free of seizure activity  Outcome: Progressing Towards Goal  Goal: *STG: Maintains lab values within therapeutic range  Outcome: Progressing Towards Goal  Goal: *STG/LTG: Complies with medication therapy  Outcome: Progressing Towards Goal  Goal: *STG: Remains free of injury during seizure activity   Outcome: Progressing Towards Goal  Goal: *STG: Remains safe in hospital  Outcome: Progressing Towards Goal  Goal: Interventions  Outcome: Progressing Towards Goal     Problem: Patient Education: Go to Patient Education Activity  Goal: Patient/Family Education  Outcome: Progressing Towards Goal
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
## 2018-05-18 NOTE — Progress Notes (Addendum)
1020 - patient arrives from ED to unit. RN has not received report. Patient disoriented, drooling at mouth.   1030 - called and received report from ED.  1037 - called neurology consult to Dr. Hegab at this time.    1100 - Primary Nurse Jonathan T Fielder, RN and sara, RN performed a dual skin assessment on this patient No impairment noted  Braden score is 12  1900 - Bedside and Verbal shift change report given to catherine (oncoming nurse) by jonathan f (offgoing nurse). Report included the following information SBAR, Kardex, Intake/Output, MAR, Recent Results and Cardiac Rhythm NSR.

## 2018-05-18 NOTE — ED Notes (Signed)
Verbal shift change report given to Lauren (oncoming nurse) by Emily (offgoing nurse). Report included the following information SBAR, ED Summary, MAR and Recent Results.
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
## 2018-05-18 NOTE — Progress Notes (Signed)
Reason for Admission:   Seizure Activity, Fall                RRAT Score:     35             Resources/supports as identified by patient/family:   Pt has very supportive and involved family, Pt has Medicaid                 Top Challenges facing patient (as identified by patient/family and CM):                       Finances/Medication cost?      Pt family denied concerns               Transportation?  Pt transported to ED via EMS, Pt family assist with transportation needs, Pt eligible for Medicaid transport services               Support system or lack thereof?  Pt has adequate and supportive social support                      Living arrangements?     Prior to admission, Pt resided with her adult daughter and 37 y/o granddaughter. Pt sister would stay with Pt over the weekends. Pt was residing at [facility] for LTC over the summer, however family took Pt out and desired to care for Pt home. Pt family acknowledge Pt requires higher level of care with 24/7 supervision and care giving.            Self-care/ADLs/Cognition?  Pt ambulatory, however requires supervision due to cognitive deficits. Pt did not require DME at home.           Current Advanced Directive/Advance Care Plan:  Pt is full code, no adv care plan on file.                           Plan for utilizing home health:    At this time there are no recommendations for [facility]                 Transition of Care Plan:      1) Referrals pending with LTC: [facility] and [facility].  2) CM will assist with [facility] prior to D/C to assist with LTC placement.    Pt is a 72 y/o [place] American female arrived via EMS to ED pere c/o fall. CM consulted to assist with possible placement as Pt family unable to care for at home. Pt has had three ED visits within 4 days: 10/2 - [facility], 10/3 - [facility] and 10/5 - [facility]. Upon CM arrival to Pt bedside Pt presented with seizure like activity. RN and MD made aware. CM met with Pt  daughter: Verdene Lennert [phone], 59 y/o granddaughter Bess Harvest and sister Chrys Racer [phone]. Verdene Lennert reported they took Pt out of LTC at [facility] as they were not satisfied with the care and wanted to provide care at home. Pt family understands caring for Pt at home is not adequate as Pt requires 24/7 supervision. CM discussed possible options for additional care at home to include aide services, however Pt family feel Pt would require more. PT has been consulted. FOC offered for above LTC facilities. Referrals pending. Pt will be admitted for medical diagnosis.     Wende Neighbors, LCSW  Ext #  [phone]

## 2018-05-18 NOTE — ACP (Advance Care Planning) (Signed)
Advance Care Planning Note      NAME: MORNING HALBERG   DOB:  03/03/1946   MRN:  [id number]     Date/Time:  05/18/2018 10:56 AM    Active Diagnoses:  Hospital Problems  Date Reviewed: 05-Apr-2018          Codes Class Noted POA    Recurrent seizures (HCC) ICD-10-CM: G40.909  ICD-9-CM: 345.80  05/18/2018 Unknown              These active diagnoses are of sufficient risk that focused discussion on advance care planning is indicated in order to allow the patient to thoughtfully consider personal goals of care, and if situations arise that prevent the ability to personally give input, to ensure appropriate representation of their personal desires for different levels and aggressiveness of care.     Discussion:   Code status addressed and wants to be a Full Code.  Patient wants central line and vasopressors if needed. Patient would also want a feeding tube, if needed, for nutritional support.  Patient  would like to assign [name] [phone] as the surrogate decision maker.    Persons present and participating in discussion: Drema Dallas, MD, nad phone by DTR/Sister      Time Spent:   Total time spent face-to-face in education and discussion:   20  minutes.         Hinda Lenis, MD   Hospitalist

## 2018-05-18 NOTE — Progress Notes (Signed)
Occupational Therapy    Order received, chart reviewed, noted patient with active seizures with ativan this morning, spoke with Jonathan, RN who requested to hold, patient drowsy, post-ictal and not alert enough for medications. Will follow up tomorrow as able for evaluation.    Jessica T. Hunsucker, MS OTR/L

## 2018-05-18 NOTE — Progress Notes (Signed)
1020 - patient arrives from ED to unit. RN has not received report. Patient disoriented, drooling at mouth.   1030 - called and received report from ED.  1037 - called neurology consult to Dr. Alcario DroughtHegab at this time.    1100 - Primary Nurse Lorrin MaisJonathan T Fielder, RN and sara, RN performed a dual skin assessment on this patient No impairment noted  Braden score is 12  1900 - Bedside and Verbal shift change report given to catherine (oncoming nurse) by Dorothe Peajonathan f (offgoing nurse). Report included the following information SBAR, Kardex, Intake/Output, MAR, Recent Results and Cardiac Rhythm NSR.

## 2018-05-18 NOTE — Progress Notes (Signed)
Reason for Admission:   Seizure Activity, Fall                RRAT Score:     35             Resources/supports as identified by patient/family:   Pt has very supportive and involved family, Pt has Medicaid                 Top Challenges facing patient (as identified by patient/family and CM):                       Finances/Medication cost?      Pt family denied concerns               Transportation?  Pt transported to ED via EMS, Pt family assist with transportation needs, Pt eligible for Medicaid transport services               Support system or lack thereof?  Pt has adequate and supportive social support                      Living arrangements?     Prior to admission, Pt resided with her adult daughter and 3 y/o granddaughter. Pt sister would stay with Pt over the weekends. Pt was residing at [facility] for LTC over the summer, however family took Pt out and desired to care for Pt home. Pt family acknowledge Pt requires higher level of care with 24/7 supervision and care giving.            Self-care/ADLs/Cognition?  Pt ambulatory, however requires supervision due to cognitive deficits. Pt did not require DME at home.           Current Advanced Directive/Advance Care Plan:  Pt is full code, no adv care plan on file.                           Plan for utilizing home health:    At this time there are no recommendations for [facility].                 Transition of Care Plan:      1) Referrals pending with LTC: [facility] and [facility].  2) CM will assist with [facility] prior to D/C to assist with LTC placement.    Pt is a 72 y/o [place] female arrived via EMS to ED pere c/o fall. CM consulted to assist with possible placement as Pt family unable to care for at home. Pt has had three ED visits within 4 days: 10/2 - [facility], 10/3 - [facility] and 10/5 - [facility]. Upon CM arrival to Pt bedside Pt presented with seizure like activity. RN and MD made aware. CM met with Pt daughter: Suzette Battiest [phone], 56 y/o granddaughter Dale Durham and sister Rayfield Citizen [phone]. Suzette Battiest reported they took Pt out of LTC at [facility] as they were not satisfied with the care and wanted to provide care at home. Pt family understands caring for Pt at home is not adequate as Pt requires 24/7 supervision. CM discussed possible options for additional care at home to include aide services, however Pt family feel Pt would require more. PT has been consulted. FOC offered for above LTC facilities. Referrals pending. Pt will be admitted for medical diagnosis.     Harrie Foreman, LCSW  Ext #  [phone]

## 2018-05-18 NOTE — Progress Notes (Signed)
Physical Therapy    Order received, chart reviewed, noted patient with active seizures with ativan this morning, spoke with Christiane HaJonathan, RN who requested to hold, patient drowsy, postictal and not alert enough for medications. Will follow up tomorrow as able for evaluation.    Alva Garnetomina C. Scott MSPT
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
## 2018-05-18 NOTE — ED Notes (Signed)
Skin warm and dry. Respirations even and unlabored. In no apparent distress at this time. Alert and interacting appropriately. Transport at bedside to take pt to IP room via stretcher.

## 2018-05-18 NOTE — ED Notes (Signed)
Verbal shift change report given to Lauren (oncoming nurse) by Irving Burton (offgoing nurse). Report included the following information SBAR, ED Summary, MAR and Recent Results.

## 2018-05-18 NOTE — Progress Notes (Signed)
Problem: Falls - Risk of  Goal: *Absence of Falls  Description  Document Meghan [name] Fall Risk and appropriate interventions in the flowsheet.  Outcome: Progressing Towards Goal  Note:   Fall Risk Interventions:       Mentation Interventions: Bed/chair exit alarm, Door open when patient unattended    Medication Interventions: Bed/chair exit alarm, Evaluate medications/consider consulting pharmacy, Patient to call before getting OOB    Elimination Interventions: Call light in reach    History of Falls Interventions: Bed/chair exit alarm         Problem: Patient Education: Go to Patient Education Activity  Goal: Patient/Family Education  Outcome: Progressing Towards Goal     Problem: Pressure Injury - Risk of  Goal: *Prevention of pressure injury  Description  Document Braden Scale and appropriate interventions in the flowsheet.  Outcome: Progressing Towards Goal  Note:   Pressure Injury Interventions:  Sensory Interventions: Assess changes in LOC    Moisture Interventions: Absorbent underpads, Check for incontinence Q2 hours and as needed    Activity Interventions: Increase time out of bed, Pressure redistribution bed/mattress(bed type), PT/OT evaluation    Mobility Interventions: Turn and reposition approx. every two hours(pillow and wedges), PT/OT evaluation    Nutrition Interventions: Document food/fluid/supplement intake    Friction and Shear Interventions: HOB 30 degrees or less, Lift sheet                Problem: Patient Education: Go to Patient Education Activity  Goal: Patient/Family Education  Outcome: Progressing Towards Goal     Problem: Seizure Disorder (Adult)  Goal: *STG: Remains free of seizure activity  Outcome: Progressing Towards Goal  Goal: *STG: Maintains lab values within therapeutic range  Outcome: Progressing Towards Goal  Goal: *STG/LTG: Complies with medication therapy  Outcome: Progressing Towards Goal  Goal: *STG: Remains free of injury during seizure activity  Outcome: Progressing Towards  Goal  Goal: *STG: Remains safe in hospital  Outcome: Progressing Towards Goal  Goal: Interventions  Outcome: Progressing Towards Goal     Problem: Patient Education: Go to Patient Education Activity  Goal: Patient/Family Education  Outcome: Progressing Towards Goal
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
## 2018-05-18 NOTE — Progress Notes (Signed)
Occupational Therapy    Order received, chart reviewed, noted patient with active seizures with ativan this morning, spoke with Christiane HaJonathan, RN who requested to hold, patient drowsy, post-ictal and not alert enough for medications. Will follow up tomorrow as able for evaluation.    Jessica T. Hunsucker, MS OTR/L
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
## 2018-05-18 NOTE — ED Notes (Signed)
Pt appears to be having a focal seizure. Room air SpO2 down to 80%. 2L O2 administered via NC. Verbal order received for Lorazepam. Will continue to monitor.

## 2018-05-18 NOTE — ACP (Advance Care Planning) (Signed)
ACP (Advance Care Planning) by Hinda Lenis., MD at 05/18/18 1056                Author: Hinda Lenis., MD  Service: Internal Medicine  Author Type: Physician       Filed: 05/18/18 1058  Date of Service: 05/18/18 1056  Status: Signed          Editor: Hinda Lenis., MD (Physician)                                                            Advance Care Planning Note         NAME: Meghan [name]    DOB:  02-19-1946    MRN:  [id number]       Date/Time:  05/18/2018 10:56  AM      Active Diagnoses:      Hospital Problems   Date Reviewed:  03/13/2018                         Codes  Class  Noted  POA              Recurrent seizures (HCC)  ICD-10-CM: G40.909   ICD-9-CM: 345.80    05/18/2018  Unknown                          These active diagnoses are of sufficient risk that focused discussion on advance care planning is indicated in order to allow the patient to thoughtfully consider personal goals of care, and if situations arise that prevent the ability to personally give  input, to ensure appropriate representation of their personal desires for different levels and aggressiveness of care.       Discussion:    Code status addressed and wants to be a Full Code.  Patient wants central line and vasopressors if needed. Patient would also want a feeding tube, if needed, for nutritional support.  Patient  would  like to assign [name] [phone] as the surrogate decision maker.      Persons present and participating in discussion: Drema Dallas, MD, nad phone by DTR/Sister         Time Spent:    Total time spent face-to-face in education and discussion:   20  minutes.             Hinda Lenis, MD    Hospitalist

## 2018-05-18 NOTE — ED Notes (Signed)
Assumed care of pt from EMS. Pt was found by sister this AM sitting on floor by bed. Sister reports that she is acting like she had a seizure. Pt has hx of seizures. Pt also has hx of dementia and HOH and is A&O x2 at baseline. BG per EMS 118Pt on monitor x3. Family at bedside. MD at bedside.

## 2018-05-18 NOTE — ED Notes (Signed)
Spoke with Case Management - they will come see the pt.

## 2018-05-19 LAB — METABOLIC PANEL, COMPREHENSIVE
A-G Ratio: 0.9 — ABNORMAL LOW (ref 1.1–2.2)
ALT (SGPT): 14 U/L (ref 12–78)
AST (SGOT): 21 U/L (ref 15–37)
Albumin: 3.2 g/dL — ABNORMAL LOW (ref 3.5–5.0)
Alk. phosphatase: 41 U/L — ABNORMAL LOW (ref 45–117)
Anion gap: 7 mmol/L (ref 5–15)
BUN/Creatinine ratio: 8 — ABNORMAL LOW (ref 12–20)
BUN: 9 MG/DL (ref 6–20)
Bilirubin, total: 1.3 MG/DL — ABNORMAL HIGH (ref 0.2–1.0)
CO2: 23 mmol/L (ref 21–32)
Calcium: 8.8 MG/DL (ref 8.5–10.1)
Chloride: 109 mmol/L — ABNORMAL HIGH (ref 97–108)
Creatinine: 1.09 MG/DL — ABNORMAL HIGH (ref 0.55–1.02)
GFR est AA: >60 mL/min/{1.73_m2} (ref >60)
GFR est non-AA: 50 mL/min/{1.73_m2} — ABNORMAL LOW (ref >60)
Globulin: 3.6 g/dL (ref 2.0–4.0)
Glucose: 69 mg/dL (ref 65–100)
Potassium: 4 mmol/L (ref 3.5–5.1)
Protein, total: 6.8 g/dL (ref 6.4–8.2)
Sodium: 139 mmol/L (ref 136–145)

## 2018-05-19 LAB — CBC WITH AUTOMATED DIFF
ABS. BASOPHILS: 0 10*3/uL (ref 0.0–0.1)
ABS. EOSINOPHILS: 0.1 10*3/uL (ref 0.0–0.4)
ABS. IMM. GRANS.: 0 10*3/uL (ref 0.00–0.04)
ABS. LYMPHOCYTES: 3.1 10*3/uL (ref 0.8–3.5)
ABS. MONOCYTES: 0.7 10*3/uL (ref 0.0–1.0)
ABS. NEUTROPHILS: 2.1 10*3/uL (ref 1.8–8.0)
ABSOLUTE NRBC: 0 10*3/uL (ref 0.00–0.01)
BASOPHILS: 1 % (ref 0–1)
EOSINOPHILS: 2 % (ref 0–7)
HCT: 37.6 % (ref 35.0–47.0)
HGB: 12 g/dL (ref 11.5–16.0)
IMMATURE GRANULOCYTES: 0 % (ref 0.0–0.5)
LYMPHOCYTES: 51 % — ABNORMAL HIGH (ref 12–49)
MCH: 30.9 PG (ref 26.0–34.0)
MCHC: 31.9 g/dL (ref 30.0–36.5)
MCV: 96.9 FL (ref 80.0–99.0)
MONOCYTES: 11 % (ref 5–13)
MPV: 10.3 FL (ref 8.9–12.9)
NEUTROPHILS: 35 % (ref 32–75)
NRBC: 0 PER 100 WBC
PLATELET: 478 10*3/uL — ABNORMAL HIGH (ref 150–400)
RBC: 3.88 M/uL (ref 3.80–5.20)
RDW: 13.9 % (ref 11.5–14.5)
WBC: 6 10*3/uL (ref 3.6–11.0)

## 2018-05-19 LAB — MAGNESIUM
Magnesium: 1.9 mg/dL (ref 1.6–2.4)
Magnesium: 1.9 mg/dL (ref 1.6–2.4)

## 2018-05-19 LAB — GLUCOSE, POC: Glucose (POC): 82 mg/dL (ref 65–100)

## 2018-05-19 LAB — PHOSPHORUS
Phosphorus: 3.2 MG/DL (ref 2.6–4.7)
Phosphorus: 3.2 MG/DL (ref 2.6–4.7)

## 2018-05-19 LAB — CBC WITH AUTO DIFFERENTIAL
Basophils %: 1 % (ref 0–1)
Basophils Absolute: 0 10*3/uL (ref 0.0–0.1)
Eosinophils %: 2 % (ref 0–7)
Eosinophils Absolute: 0.1 10*3/uL (ref 0.0–0.4)
Granulocyte Absolute Count: 0 10*3/uL (ref 0.00–0.04)
Hematocrit: 37.6 % (ref 35.0–47.0)
Hemoglobin: 12 g/dL (ref 11.5–16.0)
Immature Granulocytes: 0 % (ref 0.0–0.5)
Lymphocytes %: 51 % — ABNORMAL HIGH (ref 12–49)
Lymphocytes Absolute: 3.1 10*3/uL (ref 0.8–3.5)
MCH: 30.9 PG (ref 26.0–34.0)
MCHC: 31.9 g/dL (ref 30.0–36.5)
MCV: 96.9 FL (ref 80.0–99.0)
MPV: 10.3 FL (ref 8.9–12.9)
Monocytes %: 11 % (ref 5–13)
Monocytes Absolute: 0.7 10*3/uL (ref 0.0–1.0)
NRBC Absolute: 0 10*3/uL (ref 0.00–0.01)
Neutrophils %: 35 % (ref 32–75)
Neutrophils Absolute: 2.1 10*3/uL (ref 1.8–8.0)
Nucleated RBCs: 0 PER 100 WBC
Platelets: 478 10*3/uL — ABNORMAL HIGH (ref 150–400)
RBC: 3.88 M/uL (ref 3.80–5.20)
RDW: 13.9 % (ref 11.5–14.5)
WBC: 6 10*3/uL (ref 3.6–11.0)

## 2018-05-19 LAB — COMPREHENSIVE METABOLIC PANEL
ALT: 14 U/L (ref 12–78)
AST: 21 U/L (ref 15–37)
Albumin/Globulin Ratio: 0.9 — ABNORMAL LOW (ref 1.1–2.2)
Albumin: 3.2 g/dL — ABNORMAL LOW (ref 3.5–5.0)
Alkaline Phosphatase: 41 U/L — ABNORMAL LOW (ref 45–117)
Anion Gap: 7 mmol/L (ref 5–15)
BUN: 9 MG/DL (ref 6–20)
Bun/Cre Ratio: 8 — ABNORMAL LOW (ref 12–20)
CO2: 23 mmol/L (ref 21–32)
Calcium: 8.8 MG/DL (ref 8.5–10.1)
Chloride: 109 mmol/L — ABNORMAL HIGH (ref 97–108)
Creatinine: 1.09 MG/DL — ABNORMAL HIGH (ref 0.55–1.02)
EGFR IF NonAfrican American: 50 mL/min/{1.73_m2} — ABNORMAL LOW (ref >60)
GFR African American: >60 mL/min/{1.73_m2} (ref >60)
Globulin: 3.6 g/dL (ref 2.0–4.0)
Glucose: 69 mg/dL (ref 65–100)
Potassium: 4 mmol/L (ref 3.5–5.1)
Sodium: 139 mmol/L (ref 136–145)
Total Bilirubin: 1.3 MG/DL — ABNORMAL HIGH (ref 0.2–1.0)
Total Protein: 6.8 g/dL (ref 6.4–8.2)

## 2018-05-19 LAB — POCT GLUCOSE: POC Glucose: 82 mg/dL (ref 65–100)

## 2018-05-19 MED FILL — TRICOR 48 MG TABLET: 48 mg | ORAL | Qty: 1

## 2018-05-19 MED FILL — CHILDREN'S ASPIRIN 81 MG CHEWABLE TABLET: 81 mg | ORAL | Qty: 1

## 2018-05-19 MED FILL — METHYLPHENIDATE 5 MG TAB: 5 mg | ORAL | Qty: 1

## 2018-05-19 MED FILL — NAMENDA 10 MG TABLET: 10 mg | ORAL | Qty: 1

## 2018-05-19 MED FILL — TOPIRAMATE 100 MG TAB: 100 mg | ORAL | Qty: 1

## 2018-05-19 MED FILL — ENOXAPARIN 40 MG/0.4 ML SUB-Q SYRINGE: 40 mg/0.4 mL | SUBCUTANEOUS | Qty: 0.4

## 2018-05-19 MED FILL — VIMPAT 50 MG TABLET: 50 mg | ORAL | Qty: 2

## 2018-05-19 MED FILL — BD POSIFLUSH NORMAL SALINE 0.9 % INJECTION SYRINGE: INTRAMUSCULAR | Qty: 10

## 2018-05-19 MED FILL — ARICEPT 5 MG TABLET: 5 mg | ORAL | Qty: 2

## 2018-05-19 MED FILL — CITALOPRAM 20 MG TAB: 20 mg | ORAL | Qty: 1

## 2018-05-19 MED FILL — DEPAKOTE 250 MG TABLET,DELAYED RELEASE: 250 mg | ORAL | Qty: 2

## 2018-05-19 NOTE — Progress Notes (Signed)
Problem: Falls - Risk of  Goal: *Absence of Falls  Description  Document Meghan [name] Fall Risk and appropriate interventions in the flowsheet.  Outcome: Progressing Towards Goal  Note:   Fall Risk Interventions:       Mentation Interventions: Adequate sleep, hydration, pain control, Bed/chair exit alarm, Door open when patient unattended, Evaluate medications/consider consulting pharmacy, Increase mobility, More frequent rounding, Reorient patient, Toileting rounds, Update white board    Medication Interventions: Assess postural VS orthostatic hypotension, Bed/chair exit alarm, Evaluate medications/consider consulting pharmacy, Patient to call before getting OOB, Teach patient to arise slowly, Utilize gait belt for transfers/ambulation    Elimination Interventions: Bed/chair exit alarm, Call light in reach, Patient to call for help with toileting needs, Stay With Me (per policy), Toilet paper/wipes in reach, Toileting schedule/hourly rounds    History of Falls Interventions: Bed/chair exit alarm         Problem: Patient Education: Go to Patient Education Activity  Goal: Patient/Family Education  Outcome: Progressing Towards Goal     Problem: Pressure Injury - Risk of  Goal: *Prevention of pressure injury  Description  Document Braden Scale and appropriate interventions in the flowsheet.  Outcome: Progressing Towards Goal  Note:   Pressure Injury Interventions:  Sensory Interventions: Assess changes in LOC, Assess need for specialty bed, Chair cushion, Check visual cues for pain, Discuss PT/OT consult with provider, Float heels, Keep linens dry and wrinkle-free, Maintain/enhance activity level, Minimize linen layers, Turn and reposition approx. every two hours (pillows and wedges if needed)    Moisture Interventions: Absorbent underpads, Apply protective barrier, creams and emollients, Assess need for specialty bed, Check for incontinence Q2 hours and as needed, Limit adult briefs, Maintain skin  hydration (lotion/cream), Minimize layers, Moisture barrier    Activity Interventions: Increase time out of bed, PT/OT evaluation    Mobility Interventions: Assess need for specialty bed, PT/OT evaluation, Turn and reposition approx. every two hours(pillow and wedges)    Nutrition Interventions: Document food/fluid/supplement intake    Friction and Shear Interventions: Lift sheet, Lift team/patient mobility team, Minimize layers                Problem: Patient Education: Go to Patient Education Activity  Goal: Patient/Family Education  Outcome: Progressing Towards Goal     Problem: Seizure Disorder (Adult)  Goal: *STG: Remains free of seizure activity  Outcome: Progressing Towards Goal  Goal: *STG: Maintains lab values within therapeutic range  Outcome: Progressing Towards Goal  Goal: *STG/LTG: Complies with medication therapy  Outcome: Progressing Towards Goal  Goal: *STG: Remains free of injury during seizure activity  Outcome: Progressing Towards Goal  Goal: *STG: Remains safe in hospital  Outcome: Progressing Towards Goal  Goal: Interventions  Outcome: Progressing Towards Goal     Problem: Patient Education: Go to Patient Education Activity  Goal: Patient/Family Education  Outcome: Progressing Towards Goal     Problem: Patient Education: Go to Patient Education Activity  Goal: Patient/Family Education  Outcome: Progressing Towards Goal     Problem: Patient Education: Go to Patient Education Activity  Goal: Patient/Family Education  Outcome: Progressing Towards Goal     Problem: Falls - Risk of  Goal: *Absence of Falls  Description  Document [name] Fall Risk and appropriate interventions in the flowsheet.  Outcome: Progressing Towards Goal  Note:   Fall Risk Interventions:       Mentation Interventions: Adequate sleep, hydration, pain control, Bed/chair exit alarm, Door open when patient unattended, Evaluate medications/consider consulting pharmacy, Increase mobility, More frequent  rounding, Reorient patient, Toileting rounds, Update white board    Medication Interventions: Assess postural VS orthostatic hypotension, Bed/chair exit alarm, Evaluate medications/consider consulting pharmacy, Patient to call before getting OOB, Teach patient to arise slowly, Utilize gait belt for transfers/ambulation    Elimination Interventions: Bed/chair exit alarm, Call light in reach, Patient to call for help with toileting needs, Stay With Me (per policy), Toilet paper/wipes in reach, Toileting schedule/hourly rounds    History of Falls Interventions: Bed/chair exit alarm         Problem: Patient Education: Go to Patient Education Activity  Goal: Patient/Family Education  Outcome: Progressing Towards Goal     Problem: Pressure Injury - Risk of  Goal: *Prevention of pressure injury  Description  Document Braden Scale and appropriate interventions in the flowsheet.  Outcome: Progressing Towards Goal  Note:   Pressure Injury Interventions:  Sensory Interventions: Assess changes in LOC, Assess need for specialty bed, Chair cushion, Check visual cues for pain, Discuss PT/OT consult with provider, Float heels, Keep linens dry and wrinkle-free, Maintain/enhance activity level, Minimize linen layers, Turn and reposition approx. every two hours (pillows and wedges if needed)    Moisture Interventions: Absorbent underpads, Apply protective barrier, creams and emollients, Assess need for specialty bed, Check for incontinence Q2 hours and as needed, Limit adult briefs, Maintain skin hydration (lotion/cream), Minimize layers, Moisture barrier    Activity Interventions: Increase time out of bed, PT/OT evaluation    Mobility Interventions: Assess need for specialty bed, PT/OT evaluation, Turn and reposition approx. every two hours(pillow and wedges)    Nutrition Interventions: Document food/fluid/supplement intake    Friction and Shear Interventions: Lift sheet, Lift team/patient mobility team, Minimize layers                 Problem: Patient Education: Go to Patient Education Activity  Goal: Patient/Family Education  Outcome: Progressing Towards Goal     Problem: Seizure Disorder (Adult)  Goal: *STG: Remains free of seizure activity  Outcome: Progressing Towards Goal  Goal: *STG: Maintains lab values within therapeutic range  Outcome: Progressing Towards Goal  Goal: *STG/LTG: Complies with medication therapy  Outcome: Progressing Towards Goal  Goal: *STG: Remains free of injury during seizure activity  Outcome: Progressing Towards Goal  Goal: *STG: Remains safe in hospital  Outcome: Progressing Towards Goal  Goal: Interventions  Outcome: Progressing Towards Goal     Problem: Patient Education: Go to Patient Education Activity  Goal: Patient/Family Education  Outcome: Progressing Towards Goal     Problem: Patient Education: Go to Patient Education Activity  Goal: Patient/Family Education  Outcome: Progressing Towards Goal     Problem: Patient Education: Go to Patient Education Activity  Goal: Patient/Family Education  Outcome: Progressing Towards Goal

## 2018-05-19 NOTE — Progress Notes (Signed)
Hospitalist Progress Note    NAME: Meghan [name]   DOB:  1945-08-20   MRN:  [id number]       Assessment / Plan:    Acute encephalopathy  likely secondary to  witnessed  seizure   ???Admit patient to telemetry unit  ???Neurology consultation pending   ???Continue seizure medication  ???EEG   ???Continue aspirin  ???PT OT evaluation  ???Seizure precaution  - more awake , will start a diet   No signs of infection ua/xray/ngt. Low valproic acid level   Ct head ngt for acute pathology  Hold Brain MRI till neuro sees pt  Loaded with depakote in er  ??  S/P Fall ngt w/u per ER  Fall preacutions  ??  ??  Mild renal insu likely poor po intake  IVF, creat trending down   Hold ace  Hypertension  Hold lisinopril , bp ok for now  - PRN nitro patch     History of psych??and Dementia??  ???Continue Aricept  ??  ??  Code Status:??Full??  Surrogate Decision Maker:  Kosiba,[name] Child   ??  ??  ??  DVT Prophylaxis:??Lovenox??  GI Prophylaxis: not indicated  ??  Baseline:??independent??      25.0 - 29.9 Overweight / Body mass index is 29.13 kg/m??.    Code status: Full       Subjective:     Chief Complaint / Reason for Physician Visit  FU seizure .  Discussed with RN events overnight.     Review of Systems:  Symptom Y/N Comments  Symptom Y/N Comments   Fever/Chills    Chest Pain     Poor Appetite    Edema     Cough    Abdominal Pain     Sputum    Joint Pain     SOB/DOE    Pruritis/Rash     Nausea/vomit    Tolerating PT/OT     Diarrhea    Tolerating Diet     Constipation    Other       Could NOT obtain due to: Dementia /confusion     Objective:     VITALS:   Last 24hrs VS reviewed since prior progress note. Most recent are:  Patient Vitals for the past 24 hrs:   Temp Pulse Resp BP SpO2   05/19/18 0814 ??? (!) 106 ??? ??? 97 %   05/19/18 0803 98.1 ??F (36.7 ??C) 70 18 107/63 99 %   05/19/18 0330 98.4 ??F (36.9 ??C) 67 18 107/58 100 %   05/18/18 2336 98.1 ??F (36.7 ??C) 74 18 112/60 100 %   05/18/18 1959 98.3 ??F (36.8 ??C) 78 18 120/69 100 %    05/18/18 1614 99.3 ??F (37.4 ??C) 84 18 117/64 98 %   05/18/18 1126 98.3 ??F (36.8 ??C) 82 18 139/85 ???   05/18/18 1024 98 ??F (36.7 ??C) 81 18 129/69 100 %     No intake or output data in the 24 hours ending 05/19/18 0930     PHYSICAL EXAM:  General: WD, WN. Alert, cooperative, no acute distress????  EENT:  EOMI. Anicteric sclerae. MMM  Resp:  CTA bilaterally, no wheezing or rales.  No accessory muscle use  CV:  Regular  rhythm,?? No edema  GI:  Soft, Non distended, Non tender. ??+Bowel sounds  Neurologic:?? Alert and oriented X 2, normal speech,   Psych:???? Good insight.??Not anxious nor agitated  Skin:  No rashes.  No jaundice    Reviewed most current  lab test results and cultures  YES  Reviewed most current radiology test results   YES  Review and summation of old records today    NO  Reviewed patient's current orders and MAR    YES  PMH/SH reviewed - no change compared to H&P  ________________________________________________________________________  Care Plan discussed with:    Comments   Patient x    Family      RN x    Care Manager     Consultant                        Multidiciplinary team rounds were held today with case manager, nursing, pharmacist and Higher education careers adviser.  Patient's plan of care was discussed; medications were reviewed and discharge planning was addressed.     ________________________________________________________________________  Total NON critical care TIME:  25  Minutes    Total CRITICAL CARE TIME Spent:   Minutes non procedure based      Comments   >50% of visit spent in counseling and coordination of care     ________________________________________________________________________  Minette Brine, MD     Procedures: see electronic medical records for all procedures/Xrays and details which were not copied into this note but were reviewed prior to creation of Plan.      LABS:  I reviewed today's most current labs and imaging studies.  Pertinent labs include:  Recent Labs     05/19/18   0328 05/18/18  0624 05/16/18  1558   WBC 6.0 5.1 6.4   HGB 12.0 12.9 12.1   HCT 37.6 40.5 38.9   PLT 478* 501* 365     Recent Labs     05/19/18  0328 05/18/18  0624 05/16/18  1558   NA 139 139 140   K 4.0 4.7 4.4   CL 109* 107 106   CO2 23 28 27    GLU 69 99 97   BUN 9 9 8    CREA 1.09* 1.32* 1.09*   CA 8.8 9.3 9.6   MG 1.9  --   --    PHOS 3.2  --   --    ALB 3.2* 4.0 3.9   TBILI 1.3* 0.5 0.4   SGOT 21 26 19    ALT 14 18 15        Signed: Minette Brine, MD
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
## 2018-05-19 NOTE — Progress Notes (Signed)
Pt's IV infiltrated. Pt is a difficult stick with poor venous access. New IV 24 gauge started in right forearm and writer able to get some morning labs. IV fluid rate reduced to 50ml/hr in an effort to preserve pt's IV site. Pt can benefit from mid line should she need continuous IV fluids
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
## 2018-05-19 NOTE — Progress Notes (Signed)
Pt's IV infiltrated. Pt is a difficult stick with poor venous access. New IV 24 gauge started in right forearm and writer able to get some morning labs. IV fluid rate reduced to 34ml/hr in an effort to preserve pt's IV site. Pt can benefit from mid line should she need continuous IV fluids
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
## 2018-05-19 NOTE — Progress Notes (Signed)
Progress Notes by Minette BrineBarry, Aiven Kampe K, MD at 05/19/18 0930                Author: Minette BrineBarry, Aeneas Longsworth K, MD  Service: Internal Medicine  Author Type: Physician       Filed: 05/19/18 1258  Date of Service: 05/19/18 0930  Status: Signed          Editor: Minette BrineBarry, Henessy Rohrer K, MD (Physician)                       Hospitalist Progress Note      NAME: Meghan FuchsBarbara J Murad    DOB:  1946/01/26    MRN:  [id number]          Assessment / Plan:        Acute encephalopathy  likely secondary to  witnessed  seizure   -Admit patient to telemetry unit   -Neurology consultation pending   -Continue seizure medication  -EEG   -Continue aspirin  -PT OT evaluation  -Seizure precaution   - more awake , will start a diet    No signs of infection ua/xray/ngt. Low valproic acid level    Ct head ngt for acute pathology   Hold Brain MRI till neuro sees pt   Loaded with depakote in er   ??   S/P Fall ngt w/u per ER   Fall preacutions  ??   ??   Mild renal insu likely poor po intake   IVF, creat trending down    Hold ace  Hypertension  Hold lisinopril , bp ok for now  - PRN nitro patch      History of psych??and Dementia??  -Continue Aricept   ??   ??   Code Status:??Full??   Surrogate Decision Maker:   Rider,[name]  Child        ??   ??   ??   DVT Prophylaxis:??Lovenox??   GI Prophylaxis: not indicated   ??   Baseline:??independent??         25.0 - 29.9 Overweight  / Body mass index is 29.13 kg/m??.      Code status: Full             Subjective:        Chief Complaint / Reason for Physician Visit   FU seizure .  Discussed with RN events overnight.       Review of Systems:           Symptom  Y/N  Comments    Symptom  Y/N  Comments             Fever/Chills        Chest Pain                 Poor Appetite        Edema         Cough        Abdominal Pain                 Sputum        Joint Pain         SOB/DOE        Pruritis/Rash         Nausea/vomit        Tolerating PT/OT         Diarrhea        Tolerating Diet         Constipation        Other  Could NOT  obtain due to:  Dementia /confusion          Objective:        VITALS:    Last 24hrs VS reviewed since prior progress note. Most recent are:   Patient Vitals for the past 24 hrs:            Temp  Pulse  Resp  BP  SpO2            05/19/18 0814  --  (!) 106  --  --  97 %            05/19/18 0803  98.1 ??F (36.7 ??C)  70  18  107/63  99 %     05/19/18 0330  98.4 ??F (36.9 ??C)  67  18  107/58  100 %     05/18/18 2336  98.1 ??F (36.7 ??C)  74  18  112/60  100 %     05/18/18 1959  98.3 ??F (36.8 ??C)  78  18  120/69  100 %     05/18/18 1614  99.3 ??F (37.4 ??C)  84  18  117/64  98 %     05/18/18 1126  98.3 ??F (36.8 ??C)  82  18  139/85  --            05/18/18 1024  98 ??F (36.7 ??C)  81  18  129/69  100 %        No intake or output data in the 24 hours ending 05/19/18 0930       PHYSICAL EXAM:   General: WD, WN. Alert, cooperative, no acute distress????   EENT:  EOMI. Anicteric sclerae. MMM   Resp:  CTA bilaterally, no wheezing or rales.  No accessory muscle use   CV:  Regular  rhythm,?? No edema   GI:  Soft, Non distended, Non tender. ??+Bowel sounds   Neurologic:?? Alert and oriented X 2, normal speech,    Psych:???? Good insight.??Not anxious nor agitated   Skin:  No rashes.  No jaundice      Reviewed most current lab test results and cultures  YES   Reviewed most current radiology test results   YES   Review and summation of old records today    NO   Reviewed patient's current orders and MAR    YES   PMH/SH reviewed - no change compared  to H&P   ________________________________________________________________________   Care Plan discussed with:           Comments         Patient  x           Family              RN  x       Care Manager             Consultant                              Multidiciplinary team rounds were held today with case manager, nursing, pharmacist and Higher education careers adviser.  Patient's plan of care was discussed;  medications were reviewed and discharge planning was addressed.        ________________________________________________________________________   Total NON critical care TIME:  25  Minutes      Total CRITICAL CARE TIME Spent:   Minutes non procedure based  Comments         >50% of visit spent in counseling and coordination of care         ________________________________________________________________________   Minette Brine, MD       Procedures: see electronic medical records for all procedures/Xrays and details which were not copied into this note but were reviewed prior to creation of Plan.        LABS:   I reviewed today's most current labs and imaging studies.   Pertinent labs include:     Recent Labs             05/19/18   0328  05/18/18   0624  05/16/18   1558     WBC  6.0  5.1  6.4     HGB  12.0  12.9  12.1     HCT  37.6  40.5  38.9          PLT  478*  501*  365          Recent Labs             05/19/18   0328  05/18/18   0624  05/16/18   1558     NA  139  139  140     K  4.0  4.7  4.4     CL  109*  107  106     CO2  23  28  27      GLU  69  99  97     BUN  9  9  8      CREA  1.09*  1.32*  1.09*     CA  8.8  9.3  9.6     MG  1.9   --    --      PHOS  3.2   --    --      ALB  3.2*  4.0  3.9     TBILI  1.3*  0.5  0.4     SGOT  21  26  19           ALT  14  18  15            Signed: Minette Brine, MD

## 2018-05-19 NOTE — Progress Notes (Signed)
Problem: Falls - Risk of  Goal: *Absence of Falls  Description  Document Meghan [name] Fall Risk and appropriate interventions in the flowsheet.  Outcome: Progressing Towards Goal  Note:   Fall Risk Interventions:       Mentation Interventions: Adequate sleep, hydration, pain control, Bed/chair exit alarm, Door open when patient unattended, Evaluate medications/consider consulting pharmacy, Increase mobility, More frequent rounding, Reorient patient, Toileting rounds, Update white board    Medication Interventions: Assess postural VS orthostatic hypotension, Bed/chair exit alarm, Evaluate medications/consider consulting pharmacy, Patient to call before getting OOB, Teach patient to arise slowly, Utilize gait belt for transfers/ambulation    Elimination Interventions: Bed/chair exit alarm, Call light in reach, Patient to call for help with toileting needs, Stay With Me (per policy), Toilet paper/wipes in reach, Toileting schedule/hourly rounds    History of Falls Interventions: Bed/chair exit alarm         Problem: Patient Education: Go to Patient Education Activity  Goal: Patient/Family Education  Outcome: Progressing Towards Goal     Problem: Pressure Injury - Risk of  Goal: *Prevention of pressure injury  Description  Document Braden Scale and appropriate interventions in the flowsheet.  Outcome: Progressing Towards Goal  Note:   Pressure Injury Interventions:  Sensory Interventions: Assess changes in LOC, Assess need for specialty bed, Chair cushion, Check visual cues for pain, Discuss PT/OT consult with provider, Float heels, Keep linens dry and wrinkle-free, Maintain/enhance activity level, Minimize linen layers, Turn and reposition approx. every two hours (pillows and wedges if needed)    Moisture Interventions: Absorbent underpads, Apply protective barrier, creams and emollients, Assess need for specialty bed, Check for incontinence Q2 hours and as needed, Limit adult briefs, Maintain skin hydration  (lotion/cream), Minimize layers, Moisture barrier    Activity Interventions: Increase time out of bed, PT/OT evaluation    Mobility Interventions: Assess need for specialty bed, PT/OT evaluation, Turn and reposition approx. every two hours(pillow and wedges)    Nutrition Interventions: Document food/fluid/supplement intake    Friction and Shear Interventions: Lift sheet, Lift team/patient mobility team, Minimize layers                Problem: Patient Education: Go to Patient Education Activity  Goal: Patient/Family Education  Outcome: Progressing Towards Goal     Problem: Seizure Disorder (Adult)  Goal: *STG: Remains free of seizure activity  Outcome: Progressing Towards Goal  Goal: *STG: Maintains lab values within therapeutic range  Outcome: Progressing Towards Goal  Goal: *STG/LTG: Complies with medication therapy  Outcome: Progressing Towards Goal  Goal: *STG: Remains free of injury during seizure activity  Outcome: Progressing Towards Goal  Goal: *STG: Remains safe in hospital  Outcome: Progressing Towards Goal  Goal: Interventions  Outcome: Progressing Towards Goal     Problem: Patient Education: Go to Patient Education Activity  Goal: Patient/Family Education  Outcome: Progressing Towards Goal     Problem: Patient Education: Go to Patient Education Activity  Goal: Patient/Family Education  Outcome: Progressing Towards Goal     Problem: Patient Education: Go to Patient Education Activity  Goal: Patient/Family Education  Outcome: Progressing Towards Goal     Problem: Falls - Risk of  Goal: *Absence of Falls  Description  Document [name] Fall Risk and appropriate interventions in the flowsheet.  Outcome: Progressing Towards Goal  Note:   Fall Risk Interventions:       Mentation Interventions: Adequate sleep, hydration, pain control, Bed/chair exit alarm, Door open when patient unattended, Evaluate medications/consider consulting pharmacy, Increase mobility, More frequent  rounding, Reorient patient, Toileting  rounds, Update white board    Medication Interventions: Assess postural VS orthostatic hypotension, Bed/chair exit alarm, Evaluate medications/consider consulting pharmacy, Patient to call before getting OOB, Teach patient to arise slowly, Utilize gait belt for transfers/ambulation    Elimination Interventions: Bed/chair exit alarm, Call light in reach, Patient to call for help with toileting needs, Stay With Me (per policy), Toilet paper/wipes in reach, Toileting schedule/hourly rounds    History of Falls Interventions: Bed/chair exit alarm         Problem: Patient Education: Go to Patient Education Activity  Goal: Patient/Family Education  Outcome: Progressing Towards Goal     Problem: Pressure Injury - Risk of  Goal: *Prevention of pressure injury  Description  Document Braden Scale and appropriate interventions in the flowsheet.  Outcome: Progressing Towards Goal  Note:   Pressure Injury Interventions:  Sensory Interventions: Assess changes in LOC, Assess need for specialty bed, Chair cushion, Check visual cues for pain, Discuss PT/OT consult with provider, Float heels, Keep linens dry and wrinkle-free, Maintain/enhance activity level, Minimize linen layers, Turn and reposition approx. every two hours (pillows and wedges if needed)    Moisture Interventions: Absorbent underpads, Apply protective barrier, creams and emollients, Assess need for specialty bed, Check for incontinence Q2 hours and as needed, Limit adult briefs, Maintain skin hydration (lotion/cream), Minimize layers, Moisture barrier    Activity Interventions: Increase time out of bed, PT/OT evaluation    Mobility Interventions: Assess need for specialty bed, PT/OT evaluation, Turn and reposition approx. every two hours(pillow and wedges)    Nutrition Interventions: Document food/fluid/supplement intake    Friction and Shear Interventions: Lift sheet, Lift team/patient mobility team, Minimize layers                Problem: Patient Education: Go to  Patient Education Activity  Goal: Patient/Family Education  Outcome: Progressing Towards Goal     Problem: Seizure Disorder (Adult)  Goal: *STG: Remains free of seizure activity  Outcome: Progressing Towards Goal  Goal: *STG: Maintains lab values within therapeutic range  Outcome: Progressing Towards Goal  Goal: *STG/LTG: Complies with medication therapy  Outcome: Progressing Towards Goal  Goal: *STG: Remains free of injury during seizure activity  Outcome: Progressing Towards Goal  Goal: *STG: Remains safe in hospital  Outcome: Progressing Towards Goal  Goal: Interventions  Outcome: Progressing Towards Goal     Problem: Patient Education: Go to Patient Education Activity  Goal: Patient/Family Education  Outcome: Progressing Towards Goal     Problem: Patient Education: Go to Patient Education Activity  Goal: Patient/Family Education  Outcome: Progressing Towards Goal     Problem: Patient Education: Go to Patient Education Activity  Goal: Patient/Family Education  Outcome: Progressing Towards Goal

## 2018-05-20 ENCOUNTER — Encounter: Attending: Nurse Practitioner | Primary: Internal Medicine

## 2018-05-20 LAB — METABOLIC PANEL, BASIC
Anion gap: 8 mmol/L (ref 5–15)
BUN/Creatinine ratio: 13 (ref 12–20)
BUN: 13 MG/DL (ref 6–20)
CO2: 23 mmol/L (ref 21–32)
Calcium: 8.7 MG/DL (ref 8.5–10.1)
Chloride: 108 mmol/L (ref 97–108)
Creatinine: 1.03 MG/DL — ABNORMAL HIGH (ref 0.55–1.02)
GFR est AA: >60 mL/min/{1.73_m2} (ref >60)
GFR est non-AA: 53 mL/min/{1.73_m2} — ABNORMAL LOW (ref >60)
Glucose: 80 mg/dL (ref 65–100)
Potassium: 3.5 mmol/L (ref 3.5–5.1)
Sodium: 139 mmol/L (ref 136–145)

## 2018-05-20 LAB — VALPROIC ACID
Valproic Acid: 97 ug/ml (ref 50–100)
Valproic acid: 97 ug/ml (ref 50–100)

## 2018-05-20 LAB — BASIC METABOLIC PANEL
Anion Gap: 8 mmol/L (ref 5–15)
BUN: 13 MG/DL (ref 6–20)
Bun/Cre Ratio: 13 (ref 12–20)
CO2: 23 mmol/L (ref 21–32)
Calcium: 8.7 MG/DL (ref 8.5–10.1)
Chloride: 108 mmol/L (ref 97–108)
Creatinine: 1.03 MG/DL — ABNORMAL HIGH (ref 0.55–1.02)
EGFR IF NonAfrican American: 53 mL/min/{1.73_m2} — ABNORMAL LOW (ref >60)
GFR African American: >60 mL/min/{1.73_m2} (ref >60)
Glucose: 80 mg/dL (ref 65–100)
Potassium: 3.5 mmol/L (ref 3.5–5.1)
Sodium: 139 mmol/L (ref 136–145)

## 2018-05-20 MED ORDER — FLU VACCINE QV 2019-20 (6 MOS+)(PF) 60 MCG (15 MCG X 4)/0.5 ML IM SYRINGE
60 mcg (15 mcg x 4)/0.5 mL | INTRAMUSCULAR | Status: AC
Start: 2018-05-20 — End: 2018-05-23
  Administered 2018-05-23: 17:00:00 via INTRAMUSCULAR

## 2018-05-20 MED FILL — BD POSIFLUSH NORMAL SALINE 0.9 % INJECTION SYRINGE: INTRAMUSCULAR | Qty: 10

## 2018-05-20 MED FILL — TRICOR 48 MG TABLET: 48 mg | ORAL | Qty: 1

## 2018-05-20 MED FILL — DEPAKOTE 250 MG TABLET,DELAYED RELEASE: 250 mg | ORAL | Qty: 2

## 2018-05-20 MED FILL — METHYLPHENIDATE 5 MG TAB: 5 mg | ORAL | Qty: 1

## 2018-05-20 MED FILL — NAMENDA 10 MG TABLET: 10 mg | ORAL | Qty: 1

## 2018-05-20 MED FILL — CITALOPRAM 20 MG TAB: 20 mg | ORAL | Qty: 1

## 2018-05-20 MED FILL — TOPIRAMATE 100 MG TAB: 100 mg | ORAL | Qty: 1

## 2018-05-20 MED FILL — CHILDREN'S ASPIRIN 81 MG CHEWABLE TABLET: 81 mg | ORAL | Qty: 1

## 2018-05-20 MED FILL — VIMPAT 50 MG TABLET: 50 mg | ORAL | Qty: 2

## 2018-05-20 MED FILL — ARICEPT 5 MG TABLET: 5 mg | ORAL | Qty: 2

## 2018-05-20 MED FILL — ENOXAPARIN 40 MG/0.4 ML SUB-Q SYRINGE: 40 mg/0.4 mL | SUBCUTANEOUS | Qty: 0.4

## 2018-05-20 NOTE — Progress Notes (Addendum)
Problem: Mobility Impaired (Adult and Pediatric)  Goal: *Acute Goals and Plan of Care (Insert Text)  Description  FUNCTIONAL STATUS PRIOR TO ADMISSION: The patient was ambulatory without AD, but did need supervision and assistance from family members    HOME SUPPORT PRIOR TO ADMISSION: Per chart review, the patient lived with her daughter and granddaughter and her sister was coming by on the weekends. Per CM notes, pt's family having difficulty providing the level of care the patient needs at home.  Pt was at LTC over the summer, but family brought her home to care for her.    Physical Therapy Goals  Initiated 05/19/2018  1.  Patient will move from supine to sit and sit to supine  in bed with supervision/set-up within 7 day(s).    2.  Patient will transfer from bed to chair and chair to bed with supervision/set-up using the least restrictive device within 7 day(s).  3.  Patient will perform sit to stand with supervision/set-up within 7 day(s).  4.  Patient will ambulate with supervision/set-up for 120 feet with the least restrictive device within 7 day(s).   5.  Patient will ascend/descend 1 stairs with no handrail(s) with supervision/set-up within 7 day(s).     Outcome: Progressing Towards Goal   PHYSICAL THERAPY TREATMENT  Patient: Meghan [name] (72 y.o. female)  Date: 05/20/2018  Diagnosis: Recurrent seizures (HCC) [G40.909] <principal problem not specified>       Precautions: Fall, Seizure  Chart, physical therapy assessment, plan of care and goals were reviewed.    ASSESSMENT  Patient continues with skilled PT services and is progressing towards goals. Patient demonstrates the need for SBA for functional mobility in order to ensure patient safety. She is SBA for bed mobility and tranfers. Patient demonstrates good immediate standing balance this session and no overt LOB with gait. Attempted gait training on stairs for higher level balance tasks but patient declines stating "I can't do the stairs, my legs  will give out." Considering patient gait quality it is more likely that patient does not want to ambulate on stairs.     Current Level of Function Impacting Discharge (mobility/balance): CGA-Min A    Other factors to consider for discharge: cognitively unsafe to live independently          PLAN :  Patient continues to benefit from skilled intervention to address the above impairments.  Continue treatment per established plan of care.  to address goals.    Recommendation for discharge: (in order for the patient to meet his/her long term goals)  To be determined: patient would benefit from LTC      This discharge recommendation:  Has been made in collaboration with the attending provider and/or case management    Equipment recommendations for successful discharge (if) home: none       SUBJECTIVE:   Patient stated ???O No I can't do the stairs .???    OBJECTIVE DATA SUMMARY:   Critical Behavior:  Neurologic State: Alert  Orientation Level: Oriented to person, Oriented to place, Disoriented to time, Disoriented to situation  Cognition: Follows Neurosurgeon:  Bed Mobility:     Supine to Sit: Stand-by assistance  Sit to Supine: Stand-by assistance  Scooting: Stand-by assistance        Transfers:  Sit to Stand: Stand-by assistance  Stand to Sit: Stand-by assistance  Balance:  Sitting: Intact  Standing: Impaired  Standing - Static: Good  Standing - Dynamic : Fair  Ambulation/Gait Training:  Distance (ft): 150 Feet (ft)  Assistive Device: Gait belt  Ambulation - Level of Assistance: Minimal assistance        Gait Abnormalities: Decreased step clearance        Base of Support: Narrowed;Center of gravity altered     Speed/Cadence: Pace decreased (<100 feet/min)  Step Length: Right shortened;Left shortened                    Stairs:              Therapeutic Exercises:     Pain Rating:      Activity Tolerance:   good   Please refer to the flowsheet for vital signs taken during this treatment.    After treatment patient left in no apparent distress:   Bed / chair alarm activated, Caregiver / family present, Side rails x 3 and seated upright in bed     COMMUNICATION/COLLABORATION:   The patient???s plan of care was discussed with: Registered Nurse    Coral Ceo, PT   Time Calculation: 18 mins
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
## 2018-05-20 NOTE — Progress Notes (Signed)
Hospitalist Progress Note    NAME: Meghan [name]   DOB:  10-31-1945   MRN:  [id number]       Assessment / Plan:    Acute encephalopathy  likely secondary to  witnessed  seizure   ???Neurology on board ???Continue seizure medication  ???EEG reading pending   ???Continue aspirin  ???PT OT evaluation: recommended LTC   ???Seizure precaution  - more awake , will start a diet   No signs of infection ua/xray/ngt. Low valproic acid level   Ct head ngt for acute pathology  No indication for MRI brain   Loaded with depakote in er  ??  S/P Fall ngt w/u per ER  Fall preacutions  ??  ??  Mild renal insu likely poor po intake  IVF, creat trending down   Hold ace    Hypertension  Hold lisinopril , bp ok for now  - PRN nitro patch     History of psych??and Dementia??  ???Continue Aricept  ??  ??  Code Status:??Full??  Surrogate Decision Maker:  [name],[name] Child   ??  ??  ??  DVT Prophylaxis:??Lovenox??  GI Prophylaxis: not indicated  ??  Baseline:  dispo : LTC       25.0 - 29.9 Overweight / Body mass index is 29.13 kg/m??.    Code status: Full       Subjective:     Chief Complaint / Reason for Physician Visit  FU seizure .  Discussed with RN events overnight.     Review of Systems:  Symptom Y/N Comments  Symptom Y/N Comments   Fever/Chills    Chest Pain     Poor Appetite    Edema     Cough    Abdominal Pain     Sputum    Joint Pain     SOB/DOE    Pruritis/Rash     Nausea/vomit    Tolerating PT/OT     Diarrhea    Tolerating Diet     Constipation    Other       Could NOT obtain due to: Dementia /confusion     Objective:     VITALS:   Last 24hrs VS reviewed since prior progress note. Most recent are:  Patient Vitals for the past 24 hrs:   Temp Pulse Resp BP SpO2   05/20/18 0812 97.9 ??F (36.6 ??C) 87 20 146/79 100 %   05/20/18 0409 98.2 ??F (36.8 ??C) 85 20 141/77 100 %   05/19/18 2249 98.5 ??F (36.9 ??C) 86 18 132/73 97 %   05/19/18 1927 98.5 ??F (36.9 ??C) 96 18 126/66 99 %   05/19/18 1545 99.1 ??F (37.3 ??C) (!) 103 16 131/71 100 %    05/19/18 1207 97.6 ??F (36.4 ??C) 75 16 133/69 99 %       Intake/Output Summary (Last 24 hours) at 05/20/2018 1052  Last data filed at 05/19/2018 1123  Gross per 24 hour   Intake 200 ml   Output ???   Net 200 ml        PHYSICAL EXAM:  General: WD, WN. Alert, cooperative, no acute distress????  EENT:  EOMI. Anicteric sclerae. MMM  Resp:  CTA bilaterally, no wheezing or rales.  No accessory muscle use  CV:  Regular  rhythm,?? No edema  GI:  Soft, Non distended, Non tender. ??+Bowel sounds  Neurologic:?? Alert and oriented X 2, normal speech,   Psych:???? Good insight.??Not anxious nor agitated  Skin:  No rashes.  No jaundice    Reviewed most current lab test results and cultures  YES  Reviewed most current radiology test results   YES  Review and summation of old records today    NO  Reviewed patient's current orders and MAR    YES  PMH/SH reviewed - no change compared to H&P  ________________________________________________________________________  Care Plan discussed with:    Comments   Patient x    Family      RN x    Care Manager     Consultant                       x Multidiciplinary team rounds were held today with case manager, nursing, pharmacist and clinical coordinator.  Patient's plan of care was discussed; medications were reviewed and discharge planning was addressed.     ________________________________________________________________________  Total NON critical care TIME:  25  Minutes    Total CRITICAL CARE TIME Spent:   Minutes non procedure based      Comments   >50% of visit spent in counseling and coordination of care     ________________________________________________________________________  Minette Brine, MD     Procedures: see electronic medical records for all procedures/Xrays and details which were not copied into this note but were reviewed prior to creation of Plan.      LABS:  I reviewed today's most current labs and imaging studies.  Pertinent labs include:  Recent Labs     05/19/18  0328 05/18/18   0624   WBC 6.0 5.1   HGB 12.0 12.9   HCT 37.6 40.5   PLT 478* 501*     Recent Labs     05/20/18  0306 05/19/18  0328 05/18/18  0624   NA 139 139 139   K 3.5 4.0 4.7   CL 108 109* 107   CO2 23 23 28    GLU 80 69 99   BUN 13 9 9    CREA 1.03* 1.09* 1.32*   CA 8.7 8.8 9.3   MG  --  1.9  --    PHOS  --  3.2  --    ALB  --  3.2* 4.0   TBILI  --  1.3* 0.5   SGOT  --  21 26   ALT  --  14 18       Signed: Minette Brine, MD

## 2018-05-20 NOTE — Progress Notes (Signed)
Patient pulled out IV, MD stated IV access is not necessary at this time.

## 2018-05-20 NOTE — Progress Notes (Signed)
Problem: Falls - Risk of  Goal: *Absence of Falls  Description  Document Meghan [name] Fall Risk and appropriate interventions in the flowsheet.  Outcome: Progressing Towards Goal  Note:   Fall Risk Interventions:  Mobility Interventions: Bed/chair exit alarm, Patient to call before getting OOB, OT consult for ADLs, PT Consult for mobility concerns, PT Consult for assist device competence    Mentation Interventions: Adequate sleep, hydration, pain control, Bed/chair exit alarm, Door open when patient unattended, Increase mobility, More frequent rounding, Reorient patient, Toileting rounds, Update white board    Medication Interventions: Assess postural VS orthostatic hypotension, Bed/chair exit alarm, Evaluate medications/consider consulting pharmacy, Patient to call before getting OOB, Utilize gait belt for transfers/ambulation, Teach patient to arise slowly    Elimination Interventions: Bed/chair exit alarm    History of Falls Interventions: Bed/chair exit alarm
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
## 2018-05-20 NOTE — Progress Notes (Signed)
Problem: Falls - Risk of  Goal: *Absence of Falls  Description  Document Bridgette Habermann Fall Risk and appropriate interventions in the flowsheet.  Outcome: Progressing Towards Goal  Note:   Fall Risk Interventions:  Mobility Interventions: Bed/chair exit alarm, Patient to call before getting OOB, OT consult for ADLs, PT Consult for mobility concerns, PT Consult for assist device competence    Mentation Interventions: Adequate sleep, hydration, pain control, Bed/chair exit alarm, Door open when patient unattended, Increase mobility, More frequent rounding, Reorient patient, Toileting rounds, Update white board    Medication Interventions: Assess postural VS orthostatic hypotension, Bed/chair exit alarm, Evaluate medications/consider consulting pharmacy, Patient to call before getting OOB, Utilize gait belt for transfers/ambulation, Teach patient to arise slowly    Elimination Interventions: Bed/chair exit alarm, Call light in reach, Patient to call for help with toileting needs, Stay With Me (per policy), Toilet paper/wipes in reach, Toileting schedule/hourly rounds    History of Falls Interventions: Bed/chair exit alarm         Problem: Patient Education: Go to Patient Education Activity  Goal: Patient/Family Education  Outcome: Progressing Towards Goal     Problem: Pressure Injury - Risk of  Goal: *Prevention of pressure injury  Description  Document Braden Scale and appropriate interventions in the flowsheet.  Outcome: Progressing Towards Goal  Note:   Pressure Injury Interventions:  Sensory Interventions: Assess changes in LOC, Assess need for specialty bed, Chair cushion, Check visual cues for pain, Discuss PT/OT consult with provider, Float heels, Keep linens dry and wrinkle-free, Maintain/enhance activity level, Minimize linen layers, Turn and reposition approx. every two hours (pillows and wedges if needed)    Moisture Interventions: Absorbent underpads, Apply protective barrier,  creams and emollients, Assess need for specialty bed, Check for incontinence Q2 hours and as needed, Limit adult briefs, Minimize layers, Maintain skin hydration (lotion/cream), Moisture barrier    Activity Interventions: Assess need for specialty bed, Increase time out of bed, PT/OT evaluation    Mobility Interventions: Assess need for specialty bed, PT/OT evaluation, Turn and reposition approx. every two hours(pillow and wedges)    Nutrition Interventions: Document food/fluid/supplement intake    Friction and Shear Interventions: Lift sheet, Lift team/patient mobility team, Minimize layers                Problem: Patient Education: Go to Patient Education Activity  Goal: Patient/Family Education  Outcome: Progressing Towards Goal     Problem: Seizure Disorder (Adult)  Goal: *STG: Remains free of seizure activity  Outcome: Progressing Towards Goal  Goal: *STG: Maintains lab values within therapeutic range  Outcome: Progressing Towards Goal  Goal: *STG/LTG: Complies with medication therapy  Outcome: Progressing Towards Goal  Goal: *STG: Remains free of injury during seizure activity  Outcome: Progressing Towards Goal  Goal: *STG: Remains safe in hospital  Outcome: Progressing Towards Goal  Goal: Interventions  Outcome: Progressing Towards Goal     Problem: Patient Education: Go to Patient Education Activity  Goal: Patient/Family Education  Outcome: Progressing Towards Goal     Problem: Patient Education: Go to Patient Education Activity  Goal: Patient/Family Education  Outcome: Progressing Towards Goal     Problem: Patient Education: Go to Patient Education Activity  Goal: Patient/Family Education  Outcome: Progressing Towards Goal

## 2018-05-20 NOTE — Progress Notes (Addendum)
Problem: Self Care Deficits Care Plan (Adult)  Goal: *Acute Goals and Plan of Care (Insert Text)  Description    FUNCTIONAL STATUS PRIOR TO ADMISSION: Patient was modified independent using a Single point cane per char and nothing per patient for functional mobility. Needs 24/7 supervision due to impaired cognition baseline. Lived in LTC over summer and was brought home, however family considering return to LTC due to need for 24/7 supervision.    HOME SUPPORT: The patient lived with daughter and granddaughter and does not do IADLS.    Occupational Therapy Goals  Initiated 05/19/2018  1.  Patient will perform bathing with supervision/set-up within 7 day(s).  2.  Patient will perform lower body dressing with supervision/set-up within 7 day(s).  3.  Patient will perform toilet transfers with supervision/set-up within 7 day(s).  4.  Patient will perform all aspects of toileting with supervision/set-up within 7 day(s).           Outcome: Progressing Towards Goal    OCCUPATIONAL THERAPY TREATMENT/DISCHARGE  Patient: Meghan [name] (72 y.o. female)  Date: 05/20/2018  Diagnosis: Recurrent seizures (HCC) [G40.909] <principal problem not specified>       Precautions: Fall, Seizure  Chart, occupational therapy assessment, plan of care, and goals were reviewed.    ASSESSMENT  Patient continues with skilled OT services and has progressed towards goals. She is now demonstrating the ability to perform ADLs at a supervision level, including performance of Item retrieval/setup. Patient confused at times in regards to following instructions given during ADL treatment, due to the dressing and toileting ADLs being performed out of context. Patient performing ADLs in proper sequence and demonstrating adequate problem solving PRN. No LOB during standing ADL or ambulation t/o this session. At this time the patient is at her supervision baseline based on today performance and is without further OT needs. No OT needs  after discharge as well, but 24 hour supervision recommended 2/2 her baseline cognitive deficits.     Current Level of Function (ADLs/self-care): Patient is supervision for ADLs and is independent to supervision for functional mobility.           PLAN :  Rationale for discharge: Goals achieved  Recommendation for discharge: (in order for the patient to meet his/her long term goals)  No skilled occupational therapy/ follow up rehabilitation needs identified at this time. Home with 24 hour supervision VS LTC    Equipment recommendations for successful discharge: none       OBJECTIVE DATA SUMMARY:   Cognitive/Behavioral Status:  Neurologic State: Alert;Confused  Orientation Level: Oriented to person;Oriented to place  Cognition: Follows commands;Impaired decision making;Memory loss     Perseveration: No perseveration noted  Safety/Judgement: Awareness of environment    Functional Mobility and Transfers for ADLs:  Bed Mobility:  Supine to Sit: Independent  Sit to Supine: Stand-by assistance  Scooting: Independent    Transfers:  Sit to Stand: Independent  Functional Transfers  Bathroom Mobility: Supervision/set up(ambulting without an AD)  Toilet Transfer : Independent  Bed to Chair: Supervision    Balance:  Sitting: Intact  Standing: Impaired  Standing - Static: Good  Standing - Dynamic : Good(for ambulation to/from the bathroom and for standing ADLs)    ADL Intervention:  Grooming  Washing Hands: Supervision(standing at sink)    Upper Body Dressing Assistance  Dressing Assistance: Supervision(doffed and donned robe in standing )  Cues: Verbal cues provided    Lower Body Dressing Assistance  Pants With Elastic Waist: Supervision  Socks: Supervision  Leg Crossed Method Used: Yes  Position Performed: Standing;Bending forward method;Seated edge of bed    Toileting  Clothing Management: Independent    Cognitive Retraining  Safety/Judgement: Awareness of environment    Activity Tolerance:   Good   Please refer to the flowsheet for vital signs taken during this treatment.    After treatment patient left in no apparent distress:   Sitting in chair, Call bell within reach and Bed / chair alarm activated    COMMUNICATION/COLLABORATION:   The patient???s plan of care was discussed with: Registered Nurse    Michel Harrow, OTR/L  Time Calculation: 19 mins

## 2018-05-20 NOTE — Progress Notes (Signed)
Problem: Falls - Risk of  Goal: *Absence of Falls  Description  Document [name] Fall Risk and appropriate interventions in the flowsheet.  Outcome: Progressing Towards Goal  Note:   Fall Risk Interventions:       Mentation Interventions: Bed/chair exit alarm    Medication Interventions: Bed/chair exit alarm, Evaluate medications/consider consulting pharmacy, Patient to call before getting OOB    Elimination Interventions: Call light in reach, Bed/chair exit alarm, Patient to call for help with toileting needs    History of Falls Interventions: Bed/chair exit alarm         Problem: Patient Education: Go to Patient Education Activity  Goal: Patient/Family Education  Outcome: Progressing Towards Goal     Problem: Pressure Injury - Risk of  Goal: *Prevention of pressure injury  Description  Document Braden Scale and appropriate interventions in the flowsheet.  Outcome: Progressing Towards Goal  Note:   Pressure Injury Interventions:  Sensory Interventions: Assess changes in LOC    Moisture Interventions: Absorbent underpads, Check for incontinence Q2 hours and as needed    Activity Interventions: Increase time out of bed, PT/OT evaluation    Mobility Interventions: PT/OT evaluation    Nutrition Interventions: Document food/fluid/supplement intake    Friction and Shear Interventions: Lift sheet, Lift team/patient mobility team, Minimize layers                Problem: Seizure Disorder (Adult)  Goal: *STG: Remains free of seizure activity  Outcome: Progressing Towards Goal  Goal: *STG: Maintains lab values within therapeutic range  Outcome: Progressing Towards Goal  Goal: *STG/LTG: Complies with medication therapy  Outcome: Progressing Towards Goal  Goal: *STG: Remains free of injury during seizure activity  Outcome: Progressing Towards Goal  Goal: *STG: Remains safe in hospital  Outcome: Progressing Towards Goal  Goal: Interventions  Outcome: Progressing Towards Goal

## 2018-05-20 NOTE — Progress Notes (Signed)
TOC Plan--LTC.    Spoke with Steven from [facility] which was the families first choice however they do not have long term care beds available. . The daughter's second choice is [facility] and they have ltc beds. Spoke with liason and she will confirm patient's medicaid and get back with me tomorrow.        Vernice C.Stowers-Yerby  RN BSN CRM    Case Manager    [phone]
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
## 2018-05-20 NOTE — Progress Notes (Signed)
TOC Plan--LTC.    Spoke with Viviann Spare from [facility] which was the families first choice however they do not have long term care beds available. . The daughter's second choice is [facility] and they have ltc beds. Spoke with liason and she will confirm patient's medicaid and get back with me tomorrow.        Vernice C.Stowers-Yerby  RN BSN CRM    Case Manager    [phone]
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
## 2018-05-20 NOTE — Progress Notes (Signed)
 Problem: Mobility Impaired (Adult and Pediatric)  Goal: *Acute Goals and Plan of Care (Insert Text)  Description  FUNCTIONAL STATUS PRIOR TO ADMISSION: The patient was ambulatory without AD, but did need supervision and assistance from family members    HOME SUPPORT PRIOR TO ADMISSION: Per chart review, the patient lived with her daughter and granddaughter and her sister was coming by on the weekends. Per CM notes, pt's family having difficulty providing the level of care the patient needs at home.  Pt was at LTC over the summer, but family brought her home to care for her.    Physical Therapy Goals  Initiated 05/19/2018  1.  Patient will move from supine to sit and sit to supine  in bed with supervision/set-up within 7 day(s).    2.  Patient will transfer from bed to chair and chair to bed with supervision/set-up using the least restrictive device within 7 day(s).  3.  Patient will perform sit to stand with supervision/set-up within 7 day(s).  4.  Patient will ambulate with supervision/set-up for 120 feet with the least restrictive device within 7 day(s).   5.  Patient will ascend/descend 1 stairs with no handrail(s) with supervision/set-up within 7 day(s).     Outcome: Progressing Towards Goal   PHYSICAL THERAPY TREATMENT  Patient: Meghan [name] (72 y.o. female)  Date: 05/20/2018  Diagnosis: Recurrent seizures (HCC) [G40.909] <principal problem not specified>       Precautions: Fall, Seizure  Chart, physical therapy assessment, plan of care and goals were reviewed.    ASSESSMENT  Patient continues with skilled PT services and is progressing towards goals. Patient demonstrates the need for SBA for functional mobility in order to ensure patient safety. She is SBA for bed mobility and tranfers. Patient demonstrates good immediate standing balance this session and no overt LOB with gait. Attempted gait training on stairs for higher level balance tasks but patient declines stating I can't do the stairs, my legs  will give out. Considering patient gait quality it is more likely that patient does not want to ambulate on stairs.     Current Level of Function Impacting Discharge (mobility/balance): CGA-Min A    Other factors to consider for discharge: cognitively unsafe to live independently          PLAN :  Patient continues to benefit from skilled intervention to address the above impairments.  Continue treatment per established plan of care.  to address goals.    Recommendation for discharge: (in order for the patient to meet his/her long term goals)  To be determined: patient would benefit from LTC      This discharge recommendation:  Has been made in collaboration with the attending provider and/or case management    Equipment recommendations for successful discharge (if) home: none       SUBJECTIVE:   Patient stated "O No I can't do the stairs ."    OBJECTIVE DATA SUMMARY:   Critical Behavior:  Neurologic State: Alert  Orientation Level: Oriented to person, Oriented to place, Disoriented to time, Disoriented to situation  Cognition: Follows Neurosurgeon:  Bed Mobility:     Supine to Sit: Stand-by assistance  Sit to Supine: Stand-by assistance  Scooting: Stand-by assistance        Transfers:  Sit to Stand: Stand-by assistance  Stand to Sit: Stand-by assistance  Balance:  Sitting: Intact  Standing: Impaired  Standing - Static: Good  Standing - Dynamic : Fair  Ambulation/Gait Training:  Distance (ft): 150 Feet (ft)  Assistive Device: Gait belt  Ambulation - Level of Assistance: Minimal assistance        Gait Abnormalities: Decreased step clearance        Base of Support: Narrowed;Center of gravity altered     Speed/Cadence: Pace decreased (<100 feet/min)  Step Length: Right shortened;Left shortened                    Stairs:              Therapeutic Exercises:     Pain Rating:      Activity Tolerance:   good  Please refer to the flowsheet for vital signs taken during  this treatment.    After treatment patient left in no apparent distress:   Bed / chair alarm activated, Caregiver / family present, Side rails x 3 and seated upright in bed     COMMUNICATION/COLLABORATION:   The patient's plan of care was discussed with: Registered Nurse    Harlene Pesa, PT   Time Calculation: 18 mins
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
## 2018-05-20 NOTE — Progress Notes (Signed)
Problem: Self Care Deficits Care Plan (Adult)  Goal: *Acute Goals and Plan of Care (Insert Text)  Description    FUNCTIONAL STATUS PRIOR TO ADMISSION: Patient was modified independent using a Single point cane per char and nothing per patient for functional mobility. Needs 24/7 supervision due to impaired cognition baseline. Lived in LTC over summer and was brought home, however family considering return to LTC due to need for 24/7 supervision.    HOME SUPPORT: The patient lived with daughter and granddaughter and does not do IADLS.    Occupational Therapy Goals  Initiated 05/19/2018  1.  Patient will perform bathing with supervision/set-up within 7 day(s).  2.  Patient will perform lower body dressing with supervision/set-up within 7 day(s).  3.  Patient will perform toilet transfers with supervision/set-up within 7 day(s).  4.  Patient will perform all aspects of toileting with supervision/set-up within 7 day(s).           Outcome: Progressing Towards Goal    OCCUPATIONAL THERAPY TREATMENT/DISCHARGE  Patient: Meghan [name] (72 y.o. female)  Date: 05/20/2018  Diagnosis: Recurrent seizures (HCC) [G40.909] <principal problem not specified>       Precautions: Fall, Seizure  Chart, occupational therapy assessment, plan of care, and goals were reviewed.    ASSESSMENT  Patient continues with skilled OT services and has progressed towards goals. She is now demonstrating the ability to perform ADLs at a supervision level, including performance of Item retrieval/setup. Patient confused at times in regards to following instructions given during ADL treatment, due to the dressing and toileting ADLs being performed out of context. Patient performing ADLs in proper sequence and demonstrating adequate problem solving PRN. No LOB during standing ADL or ambulation t/o this session. At this time the patient is at her supervision baseline based on today performance and is without further OT needs. No OT needs after discharge as  well, but 24 hour supervision recommended 2/2 her baseline cognitive deficits.     Current Level of Function (ADLs/self-care): Patient is supervision for ADLs and is independent to supervision for functional mobility.           PLAN :  Rationale for discharge: Goals achieved  Recommendation for discharge: (in order for the patient to meet his/her long term goals)  No skilled occupational therapy/ follow up rehabilitation needs identified at this time. Home with 24 hour supervision VS LTC    Equipment recommendations for successful discharge: none       OBJECTIVE DATA SUMMARY:   Cognitive/Behavioral Status:  Neurologic State: Alert;Confused  Orientation Level: Oriented to person;Oriented to place  Cognition: Follows commands;Impaired decision making;Memory loss     Perseveration: No perseveration noted  Safety/Judgement: Awareness of environment    Functional Mobility and Transfers for ADLs:  Bed Mobility:  Supine to Sit: Independent  Sit to Supine: Stand-by assistance  Scooting: Independent    Transfers:  Sit to Stand: Independent  Functional Transfers  Bathroom Mobility: Supervision/set up(ambulting without an AD)  Toilet Transfer : Independent  Bed to Chair: Supervision    Balance:  Sitting: Intact  Standing: Impaired  Standing - Static: Good  Standing - Dynamic : Good(for ambulation to/from the bathroom and for standing ADLs)    ADL Intervention:  Grooming  Washing Hands: Supervision(standing at sink)    Upper Body Dressing Assistance  Dressing Assistance: Supervision(doffed and donned robe in standing )  Cues: Verbal cues provided    Lower Body Dressing Assistance  Pants With Elastic Waist: Supervision  Socks: Supervision  Leg Crossed Method Used: Yes  Position Performed: Standing;Bending forward method;Seated edge of bed    Toileting  Clothing Management: Independent    Cognitive Retraining  Safety/Judgement: Awareness of environment    Activity Tolerance:   Good  Please refer to the flowsheet for vital signs  taken during this treatment.    After treatment patient left in no apparent distress:   Sitting in chair, Call bell within reach and Bed / chair alarm activated    COMMUNICATION/COLLABORATION:   The patient???s plan of care was discussed with: Registered Nurse    Michel Harrow, OTR/L  Time Calculation: 19 mins

## 2018-05-20 NOTE — Progress Notes (Signed)
Patient pulled out IV, MD stated IV access is not necessary at this time.

## 2018-05-20 NOTE — Progress Notes (Signed)
Problem: Falls - Risk of  Goal: *Absence of Falls  Description  Document Bridgette HabermannSchmid Fall Risk and appropriate interventions in the flowsheet.  Outcome: Progressing Towards Goal  Note:   Fall Risk Interventions:       Mentation Interventions: Bed/chair exit alarm    Medication Interventions: Bed/chair exit alarm, Evaluate medications/consider consulting pharmacy, Patient to call before getting OOB    Elimination Interventions: Call light in reach, Bed/chair exit alarm, Patient to call for help with toileting needs    History of Falls Interventions: Bed/chair exit alarm         Problem: Patient Education: Go to Patient Education Activity  Goal: Patient/Family Education  Outcome: Progressing Towards Goal     Problem: Pressure Injury - Risk of  Goal: *Prevention of pressure injury  Description  Document Braden Scale and appropriate interventions in the flowsheet.  Outcome: Progressing Towards Goal  Note:   Pressure Injury Interventions:  Sensory Interventions: Assess changes in LOC    Moisture Interventions: Absorbent underpads, Check for incontinence Q2 hours and as needed    Activity Interventions: Increase time out of bed, PT/OT evaluation    Mobility Interventions: PT/OT evaluation    Nutrition Interventions: Document food/fluid/supplement intake    Friction and Shear Interventions: Lift sheet, Lift team/patient mobility team, Minimize layers                Problem: Seizure Disorder (Adult)  Goal: *STG: Remains free of seizure activity  Outcome: Progressing Towards Goal  Goal: *STG: Maintains lab values within therapeutic range  Outcome: Progressing Towards Goal  Goal: *STG/LTG: Complies with medication therapy  Outcome: Progressing Towards Goal  Goal: *STG: Remains free of injury during seizure activity  Outcome: Progressing Towards Goal  Goal: *STG: Remains safe in hospital  Outcome: Progressing Towards Goal  Goal: Interventions  Outcome: Progressing Towards Goal

## 2018-05-20 NOTE — Progress Notes (Signed)
Progress Notes by Minette Brine, MD at 05/20/18 1052                Author: Minette Brine, MD  Service: Internal Medicine  Author Type: Physician       Filed: 05/20/18 1305  Date of Service: 05/20/18 1052  Status: Signed          Editor: Minette Brine, MD (Physician)                       Hospitalist Progress Note      NAME: Meghan [name]    DOB:  1946-06-20    MRN:  [id number]          Assessment / Plan:        Acute encephalopathy  likely secondary to  witnessed  seizure   -Neurology on board -Continue  seizure medication  -EEG reading pending   -Continue aspirin  -PT OT evaluation: recommended LTC   -Seizure precaution   - more awake , will start a diet    No signs of infection ua/xray/ngt. Low valproic acid level    Ct head ngt for acute pathology   No indication for MRI brain    Loaded with depakote in er   ??   S/P Fall ngt w/u per ER   Fall preacutions  ??   ??   Mild renal insu likely poor po intake   IVF, creat trending down    Hold ace     Hypertension  Hold lisinopril , bp ok for now  - PRN nitro patch      History of psych??and Dementia??  -Continue Aricept   ??   ??   Code Status:??Full??   Surrogate Decision Maker:   Lantz,[name]  Child        ??   ??   ??   DVT Prophylaxis:??Lovenox??   GI Prophylaxis: not indicated   ??   Baseline:   dispo : LTC          25.0 - 29.9 Overweight  / Body mass index is 29.13 kg/m??.      Code status: Full             Subjective:        Chief Complaint / Reason for Physician Visit   FU seizure .  Discussed with RN events overnight.       Review of Systems:           Symptom  Y/N  Comments    Symptom  Y/N  Comments             Fever/Chills        Chest Pain                 Poor Appetite        Edema         Cough        Abdominal Pain                 Sputum        Joint Pain         SOB/DOE        Pruritis/Rash         Nausea/vomit        Tolerating PT/OT         Diarrhea        Tolerating Diet         Constipation  Other               Could NOT obtain due to:   Dementia /confusion          Objective:        VITALS:    Last 24hrs VS reviewed since prior progress note. Most recent are:   Patient Vitals for the past 24 hrs:            Temp  Pulse  Resp  BP  SpO2            05/20/18 0812  97.9 ??F (36.6 ??C)  87  20  146/79  100 %            05/20/18 0409  98.2 ??F (36.8 ??C)  85  20  141/77  100 %     05/19/18 2249  98.5 ??F (36.9 ??C)  86  18  132/73  97 %     05/19/18 1927  98.5 ??F (36.9 ??C)  96  18  126/66  99 %     05/19/18 1545  99.1 ??F (37.3 ??C)  (!) 103  16  131/71  100 %            05/19/18 1207  97.6 ??F (36.4 ??C)  75  16  133/69  99 %           Intake/Output Summary (Last 24 hours) at 05/20/2018 1052   Last data filed at 05/19/2018 1123     Gross per 24 hour        Intake  200 ml        Output  --        Net  200 ml            PHYSICAL EXAM:   General: WD, WN. Alert, cooperative, no acute distress????   EENT:  EOMI. Anicteric sclerae. MMM   Resp:  CTA bilaterally, no wheezing or rales.  No accessory muscle use   CV:  Regular  rhythm,?? No edema   GI:  Soft, Non distended, Non tender. ??+Bowel sounds   Neurologic:?? Alert and oriented X 2, normal speech,    Psych:???? Good insight.??Not anxious nor agitated   Skin:  No rashes.  No jaundice      Reviewed most current lab test results and cultures  YES   Reviewed most current radiology test results   YES   Review and summation of old records today    NO   Reviewed patient's current orders and MAR    YES   PMH/SH reviewed - no change compared  to H&P   ________________________________________________________________________   Care Plan discussed with:           Comments         Patient  x           Family              RN  x       Care Manager             Consultant                            x  Multidiciplinary team rounds were held today with case manager, nursing, pharmacist and clinical coordinator.  Patient's plan of care was discussed;  medications were reviewed and discharge planning was addressed.        ________________________________________________________________________   Total NON critical  care TIME:  25  Minutes      Total CRITICAL CARE TIME Spent:   Minutes non procedure based              Comments         >50% of visit spent in counseling and coordination of care         ________________________________________________________________________   Minette Brine, MD       Procedures: see electronic medical records for all procedures/Xrays and details which were not copied into this note but were reviewed prior to creation of Plan.        LABS:   I reviewed today's most current labs and imaging studies.   Pertinent labs include:     Recent Labs            05/19/18   0328  05/18/18   0624     WBC  6.0  5.1     HGB  12.0  12.9     HCT  37.6  40.5         PLT  478*  501*          Recent Labs             05/20/18   0306  05/19/18   0328  05/18/18   0624     NA  139  139  139     K  3.5  4.0  4.7     CL  108  109*  107     CO2  23  23  28      GLU  80  69  99     BUN  13  9  9      CREA  1.03*  1.09*  1.32*     CA  8.7  8.8  9.3     MG   --   1.9   --      PHOS   --   3.2   --      ALB   --   3.2*  4.0     TBILI   --   1.3*  0.5     SGOT   --   21  26          ALT   --   14  18           Signed: Minette Brine, MD

## 2018-05-20 NOTE — Progress Notes (Signed)
Problem: Falls - Risk of  Goal: *Absence of Falls  Description  Document Meghan [name] Fall Risk and appropriate interventions in the flowsheet.  Outcome: Progressing Towards Goal  Note:   Fall Risk Interventions:  Mobility Interventions: Bed/chair exit alarm, Patient to call before getting OOB, OT consult for ADLs, PT Consult for mobility concerns, PT Consult for assist device competence    Mentation Interventions: Adequate sleep, hydration, pain control, Bed/chair exit alarm, Door open when patient unattended, Increase mobility, More frequent rounding, Reorient patient, Toileting rounds, Update white board    Medication Interventions: Assess postural VS orthostatic hypotension, Bed/chair exit alarm, Evaluate medications/consider consulting pharmacy, Patient to call before getting OOB, Utilize gait belt for transfers/ambulation, Teach patient to arise slowly    Elimination Interventions: Bed/chair exit alarm, Call light in reach, Patient to call for help with toileting needs, Stay With Me (per policy), Toilet paper/wipes in reach, Toileting schedule/hourly rounds    History of Falls Interventions: Bed/chair exit alarm         Problem: Patient Education: Go to Patient Education Activity  Goal: Patient/Family Education  Outcome: Progressing Towards Goal     Problem: Pressure Injury - Risk of  Goal: *Prevention of pressure injury  Description  Document Braden Scale and appropriate interventions in the flowsheet.  Outcome: Progressing Towards Goal  Note:   Pressure Injury Interventions:  Sensory Interventions: Assess changes in LOC, Assess need for specialty bed, Chair cushion, Check visual cues for pain, Discuss PT/OT consult with provider, Float heels, Keep linens dry and wrinkle-free, Maintain/enhance activity level, Minimize linen layers, Turn and reposition approx. every two hours (pillows and wedges if needed)    Moisture Interventions: Absorbent underpads, Apply protective barrier, creams and emollients, Assess need  for specialty bed, Check for incontinence Q2 hours and as needed, Limit adult briefs, Minimize layers, Maintain skin hydration (lotion/cream), Moisture barrier    Activity Interventions: Assess need for specialty bed, Increase time out of bed, PT/OT evaluation    Mobility Interventions: Assess need for specialty bed, PT/OT evaluation, Turn and reposition approx. every two hours(pillow and wedges)    Nutrition Interventions: Document food/fluid/supplement intake    Friction and Shear Interventions: Lift sheet, Lift team/patient mobility team, Minimize layers                Problem: Patient Education: Go to Patient Education Activity  Goal: Patient/Family Education  Outcome: Progressing Towards Goal     Problem: Seizure Disorder (Adult)  Goal: *STG: Remains free of seizure activity  Outcome: Progressing Towards Goal  Goal: *STG: Maintains lab values within therapeutic range  Outcome: Progressing Towards Goal  Goal: *STG/LTG: Complies with medication therapy  Outcome: Progressing Towards Goal  Goal: *STG: Remains free of injury during seizure activity  Outcome: Progressing Towards Goal  Goal: *STG: Remains safe in hospital  Outcome: Progressing Towards Goal  Goal: Interventions  Outcome: Progressing Towards Goal     Problem: Patient Education: Go to Patient Education Activity  Goal: Patient/Family Education  Outcome: Progressing Towards Goal     Problem: Patient Education: Go to Patient Education Activity  Goal: Patient/Family Education  Outcome: Progressing Towards Goal     Problem: Patient Education: Go to Patient Education Activity  Goal: Patient/Family Education  Outcome: Progressing Towards Goal
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
## 2018-05-21 LAB — TSH 3RD GENERATION
TSH: 0.62 u[IU]/mL (ref 0.36–3.74)
TSH: 0.62 u[IU]/mL (ref 0.36–3.74)

## 2018-05-21 LAB — TOPIRAMATE: Topiramate: 3.4 ug/mL (ref 2.0–25.0)

## 2018-05-21 MED FILL — METHYLPHENIDATE 5 MG TAB: 5 mg | ORAL | Qty: 1

## 2018-05-21 MED FILL — DEPAKOTE 250 MG TABLET,DELAYED RELEASE: 250 mg | ORAL | Qty: 2

## 2018-05-21 MED FILL — VIMPAT 50 MG TABLET: 50 mg | ORAL | Qty: 2

## 2018-05-21 MED FILL — CITALOPRAM 20 MG TAB: 20 mg | ORAL | Qty: 1

## 2018-05-21 MED FILL — TOPIRAMATE 100 MG TAB: 100 mg | ORAL | Qty: 1

## 2018-05-21 MED FILL — ARICEPT 5 MG TABLET: 5 mg | ORAL | Qty: 2

## 2018-05-21 MED FILL — ENOXAPARIN 40 MG/0.4 ML SUB-Q SYRINGE: 40 mg/0.4 mL | SUBCUTANEOUS | Qty: 0.4

## 2018-05-21 MED FILL — TRICOR 48 MG TABLET: 48 mg | ORAL | Qty: 1

## 2018-05-21 MED FILL — BD POSIFLUSH NORMAL SALINE 0.9 % INJECTION SYRINGE: INTRAMUSCULAR | Qty: 10

## 2018-05-21 MED FILL — NAMENDA 10 MG TABLET: 10 mg | ORAL | Qty: 1

## 2018-05-21 MED FILL — CHILDREN'S ASPIRIN 81 MG CHEWABLE TABLET: 81 mg | ORAL | Qty: 1
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
## 2018-05-21 NOTE — Progress Notes (Signed)
I have reviewed pertinent labs and imaging, discussed/agreed on the plan of care with Dr. Riley Kill.         Hospitalist Progress Note    NAME: Meghan [name]   DOB:  [birth year]   MRN:  [id number]       Interim Hospital Summary: 72 y.o. female whom presented on 05/18/2018 with unwitnessed fall prior to arrival.     Assessment / Plan: CM looking for LTC placement  Acute encephalopathy ??likely secondary to ??witnessed????seizure   ???Neurology following  ???Continue Depakote and Vimpat   ???05/20/18 EEG    IMPRESSION: Abnormal for generalized slowing. No seizures noted.    Absence of seizures on this study does not exclude epilepsy. Clinical correlation is advised.  ???Continue aspirin  ???PT OT evaluation  -LTC placement pending  ???Seizure precaution  -Awake, nods occassionally to yes/no  questioning   -05/16/18 Urine Cx >100,000 MIXED UROGENITAL FLORA ISOLATED  COLONIES/mL  -05/20/18 VPA 97  -05/18/18 Head CT IMPRESSION: No acute intracranial abnormality;No indication for MRI brain     ??  S/P Fall PTA  -Fall preacutions  ??  ??  Mild renal insufficiency, resolving  -IVF completed  -Creatinine 05/20/18 1.03   -Continue to Hold ACEI      Hypertension  -ACEI on hold   -PRN??nitro patch??and hydralazine   -B/P 108/67      History of psych??and Dementia??  ???Continue Aricept and Namenda        25.0 - 29.9 Overweight / Body mass index is 29.13 kg/m??.    Code status: Full  Prophylaxis: Lovenox  Recommended Disposition: SNF/LTC; CM looking for LTC Placement     Subjective:     Chief Complaint / Reason for Physician Visit  F/U on seizures. Pt not verbal, She did not to yes/no questioning, Unsure of how much she understands. Dementia at baseline.  Discussed with RN events overnight.     Review of Systems:  Symptom Y/N Comments  Symptom Y/N Comments   Fever/Chills    Chest Pain     Poor Appetite    Edema     Cough    Abdominal Pain     Sputum    Joint Pain     SOB/DOE    Pruritis/Rash     Nausea/vomit    Tolerating PT/OT      Diarrhea    Tolerating Diet     Constipation    Other       Could NOT obtain due to: Dementia     Objective:     VITALS:   Last 24hrs VS reviewed since prior progress note. Most recent are:  Patient Vitals for the past 24 hrs:   Temp Pulse Resp BP SpO2   05/21/18 1138 98.5 ??F (36.9 ??C) 84 18 108/67 100 %   05/21/18 0740 97.8 ??F (36.6 ??C) 72 18 123/65 100 %   05/21/18 0400 ??? 77 ??? ??? ???   05/21/18 0312 98.8 ??F (37.1 ??C) 77 16 127/78 98 %   05/21/18 0000 ??? 84 ??? ??? ???   05/20/18 2359 98.9 ??F (37.2 ??C) 83 16 (!) 153/95 98 %   05/20/18 1558 97.8 ??F (36.6 ??C) 85 18 138/71 100 %     No intake or output data in the 24 hours ending 05/21/18 1312     PHYSICAL EXAM:  General: Alert, cooperative, no acute distress    EENT:  EOMI. Anicteric sclerae. MMM  Resp:  CTA bilaterally, no wheezing or rales.  No accessory  muscle use  CV:  Regular  rhythm,  No edema  GI:  Soft, Non distended, Non tender.  +Bowel sounds  Neurologic:  Alert, UTA mental status due to Dementia   Psych:   Good insight. Not anxious nor agitated  Skin:  No rashes.  No jaundice    Reviewed most current lab test results and cultures  YES  Reviewed most current radiology test results   YES  Review and summation of old records today    NO  Reviewed patient's current orders and MAR    YES  PMH/SH reviewed - no change compared to H&P  ________________________________________________________________________  Care Plan discussed with:    Comments   Patient Y    Family      RN Y    Care Manager     Consultant                        Multidiciplinary team rounds were held today with case manager, nursing, pharmacist and clinical coordinator.  Patient's plan of care was discussed; medications were reviewed and discharge planning was addressed.     ________________________________________________________________________        Comments   >50% of visit spent in counseling and coordination of care     ________________________________________________________________________   Meghan KetKeisha Q Maggie Dworkin, NP     Procedures: see electronic medical records for all procedures/Xrays and details which were not copied into this note but were reviewed prior to creation of Plan.      LABS:  I reviewed today's most current labs and imaging studies.  Pertinent labs include:  Recent Labs     05/19/18  0328   WBC 6.0   HGB 12.0   HCT 37.6   PLT 478*     Recent Labs     05/20/18  0306 05/19/18  0328   NA 139 139   K 3.5 4.0   CL 108 109*   CO2 23 23   GLU 80 69   BUN 13 9   CREA 1.03* 1.09*   CA 8.7 8.8   MG  --  1.9   PHOS  --  3.2   ALB  --  3.2*   TBILI  --  1.3*   SGOT  --  21   ALT  --  14       Signed: )Meghan KetKeisha Q Reilly Blades, NP
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
## 2018-05-21 NOTE — Procedures (Signed)
[facility]   [address]   [place], [place] [address]      Electroencephalogram    Procedure ID: [id number] Procedure Date:05/18/2018   Patient Name: Meghan [name] Date of Birth: 02-Feb-1946   Procedure Type: Routine Medical Record No: [id number]     INDICATION: Seizure    Medications:  Current Facility-Administered Medications   Medication Dose Route Frequency   ??? influenza vaccine 2019-20 (6 mos+)(PF) (FLUARIX/FLULAVAL/FLUZONE QUAD) injection 0.5 mL  0.5 mL IntraMUSCular PRIOR TO DISCHARGE   ??? aspirin chewable tablet 81 mg  81 mg Oral DAILY   ??? citalopram (CELEXA) tablet 20 mg  20 mg Oral DAILY   ??? donepezil (ARICEPT) tablet 10 mg  10 mg Oral DAILY   ??? fenofibrate nanocrystallized (TRICOR) tablet 48 mg  48 mg Oral DAILY   ??? fluticasone propionate (FLONASE) 50 mcg/actuation nasal spray 1 Spray  1 Spray Both Nostrils DAILY   ??? hydrALAZINE (APRESOLINE) tablet 25 mg  25 mg Oral TID PRN   ??? lacosamide (VIMPAT) tablet 100 mg  100 mg Oral Q12H   ??? memantine (NAMENDA) tablet 10 mg  10 mg Oral BID   ??? methylphenidate HCl (RITALIN) tablet 5 mg  5 mg Oral BID   ??? topiramate (TOPAMAX) tablet 100 mg  100 mg Oral DAILY   ??? ondansetron (ZOFRAN) injection 4 mg  4 mg IntraVENous Q6H PRN   ??? nitroglycerin (NITROBID) 2 % ointment 1 Inch  1 Inch Topical Q6H PRN   ??? sodium chloride (NS) flush 5-40 mL  5-40 mL IntraVENous Q8H   ??? sodium chloride (NS) flush 5-40 mL  5-40 mL IntraVENous PRN   ??? LORazepam (ATIVAN) injection 2 mg  2 mg IntraVENous Q5MIN PRN   ??? acetaminophen (TYLENOL) tablet 650 mg  650 mg Oral Q6H PRN   ??? enoxaparin (LOVENOX) injection 40 mg  40 mg SubCUTAneous Q24H   ??? divalproex DR (DEPAKOTE) tablet 500 mg  500 mg Oral Q12H       DESCRIPTION OF PROCEDURE: Electrodes were applied in accordance with the international 10-20 system of electrode placement.  EEG was reviewed in both bipolar and referential montages     DESCRIPTION OF FINDINGS: Background consists of symmetric  theta activity. This activity attenuates with eye opening. With photic stimulation a symmetric occipital driving response is noted. No seizures or interictal hallmarks of epilepsy were noted.     IMPRESSION:  Abnormal for generalized slowing. No seizures noted.      Absence of seizures on this study does not exclude epilepsy. Clinical correlation is advised.

## 2018-05-21 NOTE — Progress Notes (Signed)
Problem: Mobility Impaired (Adult and Pediatric)  Goal: *Acute Goals and Plan of Care (Insert Text)  Description  FUNCTIONAL STATUS PRIOR TO ADMISSION: The patient was ambulatory without AD, but did need supervision and assistance from family members    HOME SUPPORT PRIOR TO ADMISSION: Per chart review, the patient lived with her daughter and granddaughter and her sister was coming by on the weekends. Per CM notes, pt's family having difficulty providing the level of care the patient needs at home.  Pt was at LTC over the summer, but family brought her home to care for her.    Physical Therapy Goals  Initiated 05/19/2018  1.  Patient will move from supine to sit and sit to supine  in bed with supervision/set-up within 7 day(s).    2.  Patient will transfer from bed to chair and chair to bed with supervision/set-up using the least restrictive device within 7 day(s).  3.  Patient will perform sit to stand with supervision/set-up within 7 day(s).  4.  Patient will ambulate with supervision/set-up for 120 feet with the least restrictive device within 7 day(s).   5.  Patient will ascend/descend 1 stairs with no handrail(s) with supervision/set-up within 7 day(s).     Outcome: Progressing Towards Goal   PHYSICAL THERAPY TREATMENT  Patient: Meghan [name] (72 y.o. female)  Date: 05/21/2018  Diagnosis: Recurrent seizures (HCC) [G40.909] <principal problem not specified>       Precautions: Fall, Seizure  Chart, physical therapy assessment, plan of care and goals were reviewed.    ASSESSMENT  Patient continues with skilled PT services and is progressing towards goals. Patient is supine in bed and asleep when PT arrives. She demonstrates increased lethargy and difficulty following directions secondary to fatigue. Patient repeatedly declines the need to go in to the restroom. Patient is supervision for bed mobility supine to sit. She ambulates to bedside chair with CGA. Patient is too lethargic for  increased ambulation and has too much difficulty following directions this AM to work on her balance.    Current Level of Function Impacting Discharge (mobility/balance): CGA-Min A for gait without an assistive device    Other factors to consider for discharge: balance and gait instability, balance on stairs, cognition          PLAN :  Patient continues to benefit from skilled intervention to address the above impairments.  Continue treatment per established plan of care.  to address goals.    Recommendation for discharge: (in order for the patient to meet his/her long term goals)  LTC    This discharge recommendation:  Has been made in collaboration with the attending provider and/or case management    Equipment recommendations for successful discharge (if) home: none       SUBJECTIVE:   Patient stated ???I can't eat this its hard.???    OBJECTIVE DATA SUMMARY:   Critical Behavior:  Neurologic State: Drowsy, Alert  Orientation Level: Oriented to person, Oriented to place, Disoriented to time, Oriented to situation  Cognition: Follows commands, Impaired decision making, Decreased attention/concentration, Poor safety awareness  Safety/Judgement: Awareness of environment  Functional Mobility Training:  Bed Mobility:     Supine to Sit: Supervision;Independent     Scooting: Independent        Transfers:  Sit to Stand: Supervision  Stand to Sit: Supervision        Bed to Chair: Supervision  Balance:  Sitting: Intact  Standing: Impaired  Standing - Static: Good  Standing - Dynamic : Good;Fair  Ambulation/Gait Training:  Distance (ft): 5 Feet (ft)  Assistive Device: Gait belt  Ambulation - Level of Assistance: Contact guard assistance        Gait Abnormalities: Decreased step clearance;Path deviations        Base of Support: Narrowed;Center of gravity altered     Speed/Cadence: Pace decreased (<100 feet/min)  Step Length: Right shortened;Left shortened                    Stairs:               Therapeutic Exercises:     Pain Rating:      Activity Tolerance:   Fair  Please refer to the flowsheet for vital signs taken during this treatment.    After treatment patient left in no apparent distress:   Sitting in chair, Call bell within reach and Bed / chair alarm activated    COMMUNICATION/COLLABORATION:   The patient???s plan of care was discussed with: Registered Nurse and Rehabilitation Attendant    Coral Ceo, PT   Time Calculation: 9 mins

## 2018-05-21 NOTE — Progress Notes (Signed)
TOC Plan--LTC    Sound physician liason informed me that [facility] wants to re look at the medicals. They had denied it at first. Called daughter and she states that is fine.. Referral sent to [facility] again attention Adam Harrison via cc link.     Vernice C.Stowers-Yerby  RN BSN CRM    Case Manager    [phone]

## 2018-05-21 NOTE — Progress Notes (Signed)
Problem: Falls - Risk of  Goal: *Absence of Falls  Description  Document Meghan [name] Fall Risk and appropriate interventions in the flowsheet.  Outcome: Progressing Towards Goal  Note:   Fall Risk Interventions:  Mobility Interventions: Bed/chair exit alarm, Patient to call before getting OOB, PT Consult for mobility concerns, PT Consult for assist device competence, Strengthening exercises (ROM-active/passive), Utilize walker, cane, or other assistive device    Mentation Interventions: Adequate sleep, hydration, pain control, Bed/chair exit alarm, More frequent rounding, Reorient patient    Medication Interventions: Patient to call before getting OOB, Teach patient to arise slowly, Bed/chair exit alarm    Elimination Interventions: Bed/chair exit alarm, Call light in reach, Patient to call for help with toileting needs    History of Falls Interventions: Bed/chair exit alarm, Door open when patient unattended         Problem: Patient Education: Go to Patient Education Activity  Goal: Patient/Family Education  Outcome: Progressing Towards Goal     Problem: Pressure Injury - Risk of  Goal: *Prevention of pressure injury  Description  Document Braden Scale and appropriate interventions in the flowsheet.  Outcome: Progressing Towards Goal  Note:   Pressure Injury Interventions:  Sensory Interventions: Assess changes in LOC, Discuss PT/OT consult with provider, Keep linens dry and wrinkle-free, Maintain/enhance activity level, Minimize linen layers, Monitor skin under medical devices    Moisture Interventions: Absorbent underpads    Activity Interventions: PT/OT evaluation, Increase time out of bed    Mobility Interventions: PT/OT evaluation    Nutrition Interventions: Document food/fluid/supplement intake, Offer support with meals,snacks and hydration    Friction and Shear Interventions: Minimize layers                Problem: Patient Education: Go to Patient Education Activity  Goal: Patient/Family Education   Outcome: Progressing Towards Goal     Problem: Seizure Disorder (Adult)  Goal: *STG: Remains free of seizure activity  Outcome: Progressing Towards Goal  Goal: *STG: Maintains lab values within therapeutic range  Outcome: Progressing Towards Goal  Goal: *STG/LTG: Complies with medication therapy  Outcome: Progressing Towards Goal  Goal: *STG: Remains free of injury during seizure activity  Outcome: Progressing Towards Goal  Goal: *STG: Remains safe in hospital  Outcome: Progressing Towards Goal  Goal: Interventions  Outcome: Progressing Towards Goal     Problem: Patient Education: Go to Patient Education Activity  Goal: Patient/Family Education  Outcome: Progressing Towards Goal     Problem: Patient Education: Go to Patient Education Activity  Goal: Patient/Family Education  Outcome: Progressing Towards Goal     Problem: Patient Education: Go to Patient Education Activity  Goal: Patient/Family Education  Outcome: Progressing Towards Goal

## 2018-05-21 NOTE — Progress Notes (Signed)
Problem: Falls - Risk of  Goal: *Absence of Falls  Description  Document Meghan [name] Fall Risk and appropriate interventions in the flowsheet.  Outcome: Progressing Towards Goal  Note:   Fall Risk Interventions:  Mobility Interventions: Bed/chair exit alarm, Patient to call before getting OOB, PT Consult for mobility concerns, PT Consult for assist device competence, Strengthening exercises (ROM-active/passive), Utilize walker, cane, or other assistive device    Mentation Interventions: Adequate sleep, hydration, pain control, Bed/chair exit alarm, More frequent rounding, Reorient patient    Medication Interventions: Patient to call before getting OOB, Teach patient to arise slowly, Bed/chair exit alarm    Elimination Interventions: Bed/chair exit alarm, Call light in reach, Patient to call for help with toileting needs    History of Falls Interventions: Bed/chair exit alarm, Door open when patient unattended         Problem: Patient Education: Go to Patient Education Activity  Goal: Patient/Family Education  Outcome: Progressing Towards Goal     Problem: Pressure Injury - Risk of  Goal: *Prevention of pressure injury  Description  Document Braden Scale and appropriate interventions in the flowsheet.  Outcome: Progressing Towards Goal  Note:   Pressure Injury Interventions:  Sensory Interventions: Assess changes in LOC, Discuss PT/OT consult with provider, Keep linens dry and wrinkle-free, Maintain/enhance activity level, Minimize linen layers, Monitor skin under medical devices    Moisture Interventions: Absorbent underpads    Activity Interventions: PT/OT evaluation, Increase time out of bed    Mobility Interventions: PT/OT evaluation    Nutrition Interventions: Document food/fluid/supplement intake, Offer support with meals,snacks and hydration    Friction and Shear Interventions: Minimize layers                Problem: Patient Education: Go to Patient Education Activity  Goal: Patient/Family Education  Outcome:  Progressing Towards Goal     Problem: Seizure Disorder (Adult)  Goal: *STG: Remains free of seizure activity  Outcome: Progressing Towards Goal  Goal: *STG: Maintains lab values within therapeutic range  Outcome: Progressing Towards Goal  Goal: *STG/LTG: Complies with medication therapy  Outcome: Progressing Towards Goal  Goal: *STG: Remains free of injury during seizure activity  Outcome: Progressing Towards Goal  Goal: *STG: Remains safe in hospital  Outcome: Progressing Towards Goal  Goal: Interventions  Outcome: Progressing Towards Goal     Problem: Patient Education: Go to Patient Education Activity  Goal: Patient/Family Education  Outcome: Progressing Towards Goal     Problem: Patient Education: Go to Patient Education Activity  Goal: Patient/Family Education  Outcome: Progressing Towards Goal     Problem: Patient Education: Go to Patient Education Activity  Goal: Patient/Family Education  Outcome: Progressing Towards Goal

## 2018-05-21 NOTE — Progress Notes (Signed)
 Problem: Mobility Impaired (Adult and Pediatric)  Goal: *Acute Goals and Plan of Care (Insert Text)  Description  FUNCTIONAL STATUS PRIOR TO ADMISSION: The patient was ambulatory without AD, but did need supervision and assistance from family members    HOME SUPPORT PRIOR TO ADMISSION: Per chart review, the patient lived with her daughter and granddaughter and her sister was coming by on the weekends. Per CM notes, pt's family having difficulty providing the level of care the patient needs at home.  Pt was at LTC over the summer, but family brought her home to care for her.    Physical Therapy Goals  Initiated 05/19/2018  1.  Patient will move from supine to sit and sit to supine  in bed with supervision/set-up within 7 day(s).    2.  Patient will transfer from bed to chair and chair to bed with supervision/set-up using the least restrictive device within 7 day(s).  3.  Patient will perform sit to stand with supervision/set-up within 7 day(s).  4.  Patient will ambulate with supervision/set-up for 120 feet with the least restrictive device within 7 day(s).   5.  Patient will ascend/descend 1 stairs with no handrail(s) with supervision/set-up within 7 day(s).     Outcome: Progressing Towards Goal   PHYSICAL THERAPY TREATMENT  Patient: Meghan [name] (72 y.o. female)  Date: 05/21/2018  Diagnosis: Recurrent seizures (HCC) [G40.909] <principal problem not specified>       Precautions: Fall, Seizure  Chart, physical therapy assessment, plan of care and goals were reviewed.    ASSESSMENT  Patient continues with skilled PT services and is progressing towards goals. Patient is supine in bed and asleep when PT arrives. She demonstrates increased lethargy and difficulty following directions secondary to fatigue. Patient repeatedly declines the need to go in to the restroom. Patient is supervision for bed mobility supine to sit. She ambulates to bedside chair with CGA. Patient is too lethargic for increased ambulation and  has too much difficulty following directions this AM to work on her balance.    Current Level of Function Impacting Discharge (mobility/balance): CGA-Min A for gait without an assistive device    Other factors to consider for discharge: balance and gait instability, balance on stairs, cognition          PLAN :  Patient continues to benefit from skilled intervention to address the above impairments.  Continue treatment per established plan of care.  to address goals.    Recommendation for discharge: (in order for the patient to meet his/her long term goals)  LTC    This discharge recommendation:  Has been made in collaboration with the attending provider and/or case management    Equipment recommendations for successful discharge (if) home: none       SUBJECTIVE:   Patient stated "I can't eat this its hard."    OBJECTIVE DATA SUMMARY:   Critical Behavior:  Neurologic State: Drowsy, Alert  Orientation Level: Oriented to person, Oriented to place, Disoriented to time, Oriented to situation  Cognition: Follows commands, Impaired decision making, Decreased attention/concentration, Poor safety awareness  Safety/Judgement: Awareness of environment  Functional Mobility Training:  Bed Mobility:     Supine to Sit: Supervision;Independent     Scooting: Independent        Transfers:  Sit to Stand: Supervision  Stand to Sit: Supervision        Bed to Chair: Supervision  Balance:  Sitting: Intact  Standing: Impaired  Standing - Static: Good  Standing - Dynamic : Good;Fair  Ambulation/Gait Training:  Distance (ft): 5 Feet (ft)  Assistive Device: Gait belt  Ambulation - Level of Assistance: Contact guard assistance        Gait Abnormalities: Decreased step clearance;Path deviations        Base of Support: Narrowed;Center of gravity altered     Speed/Cadence: Pace decreased (<100 feet/min)  Step Length: Right shortened;Left shortened                    Stairs:              Therapeutic Exercises:     Pain  Rating:      Activity Tolerance:   Fair  Please refer to the flowsheet for vital signs taken during this treatment.    After treatment patient left in no apparent distress:   Sitting in chair, Call bell within reach and Bed / chair alarm activated    COMMUNICATION/COLLABORATION:   The patient's plan of care was discussed with: Registered Nurse and Rehabilitation Attendant    Harlene Pesa, PT   Time Calculation: 9 mins

## 2018-05-21 NOTE — Progress Notes (Signed)
TOC Plan--LTC    Sound physician liason informed me that [facility] wants to re look at the medicals. They had denied it at first. Called daughter and she states that is fine.. Referral sent to [facility] again attention Fransisca Connors via cc link.     Vernice C.Stowers-Yerby  RN BSN CRM    Case Manager    [phone]

## 2018-05-21 NOTE — Progress Notes (Signed)
Progress Notes by Toniann Ket, NP at 05/21/18 1312                Author: Toniann Ket, NP  Service: Internal Medicine  Author Type: Nurse Practitioner       Filed: 05/21/18 1334  Date of Service: 05/21/18 1312  Status: Signed          Editor: Toniann Ket, NP (Nurse Practitioner)               I have reviewed pertinent labs and imaging, discussed/agreed on the plan of care with Dr. Riley Kill.               Hospitalist Progress Note      NAME: Meghan [name]    DOB:  [birth year]    MRN:  [id number]            Interim Hospital Summary: 72 y.o.  female whom presented on 05/18/2018 with  unwitnessed fall prior to arrival.        Assessment / Plan: CM looking for LTC placement     Acute encephalopathy ??likely secondary to ??witnessed????seizure    -Neurology following   -Continue Depakote and Vimpat   -05/20/18  EEG     IMPRESSION: Abnormal for generalized slowing. No seizures noted.         Absence of seizures on this study does not exclude epilepsy. Clinical correlation is advised.   -Continue aspirin  - PT OT evaluation   -LTC placement pending  -Seizure precaution   -Awake, nods occassionally to yes/no  questioning    -05/16/18 Urine Cx >100,000 MIXED UROGENITAL FLORA ISOLATED  COLONIES/mL   -05/20/18 VPA 97   -05/18/18 Head CT IMPRESSION: No acute intracranial abnormality;No indication for MRI brain       ??   S/P Fall PTA   -Fall preacutions  ??   ??   Mild renal insufficiency, resolving   -IVF completed   -Creatinine 05/20/18 1.03    -Continue to Hold ACEI        Hypertension  -ACEI on hold   -PRN??nitro patch??and hydralazine    -B/P 108/67        History of psych??and Dementia??  -Continue Aricept and Namenda            25.0 - 29.9 Overweight / Body mass index is 29.13 kg/m??.      Code status: Full   Prophylaxis: Lovenox   Recommended Disposition: SNF/LTC; CM looking for LTC Placement          Subjective:        Chief Complaint / Reason for Physician Visit   F/U on seizures. Pt not verbal, She did not to yes/no  questioning, Unsure of how much she understands. Dementia at baseline.  Discussed with RN events overnight.       Review of Systems:           Symptom  Y/N  Comments    Symptom  Y/N  Comments             Fever/Chills        Chest Pain                 Poor Appetite        Edema                 Cough        Abdominal Pain         Sputum  Joint Pain         SOB/DOE        Pruritis/Rash         Nausea/vomit        Tolerating PT/OT         Diarrhea        Tolerating Diet                 Constipation        Other               Could NOT obtain due to:  Dementia          Objective:        VITALS:    Last 24hrs VS reviewed since prior progress note. Most recent are:   Patient Vitals for the past 24 hrs:            Temp  Pulse  Resp  BP  SpO2            05/21/18 1138  98.5 ??F (36.9 ??C)  84  18  108/67  100 %            05/21/18 0740  97.8 ??F (36.6 ??C)  72  18  123/65  100 %     05/21/18 0400  --  77  --  --  --     05/21/18 0312  98.8 ??F (37.1 ??C)  77  16  127/78  98 %     05/21/18 0000  --  84  --  --  --     05/20/18 2359  98.9 ??F (37.2 ??C)  83  16  (!) 153/95  98 %            05/20/18 1558  97.8 ??F (36.6 ??C)  85  18  138/71  100 %        No intake or output data in the 24 hours ending 05/21/18 1312       PHYSICAL EXAM:   General: Alert, cooperative, no acute distress     EENT:  EOMI. Anicteric sclerae. MMM   Resp:  CTA bilaterally, no wheezing or rales.  No accessory muscle use   CV:  Regular  rhythm,  No edema   GI:  Soft, Non distended, Non tender.  +Bowel sounds   Neurologic:  Alert, UTA mental status due to Dementia    Psych:   Good insight. Not anxious nor agitated   Skin:  No rashes.  No jaundice      Reviewed most current lab test results and cultures  YES   Reviewed most current radiology test results   YES   Review and summation of old records today    NO   Reviewed patient's current orders and MAR    YES   PMH/SH reviewed - no change compared to H&P    ________________________________________________________________________   Care Plan discussed with:           Comments         Patient  Y           Family              RN  Y       Care Manager             Consultant  Multidiciplinary team rounds were held today with case manager, nursing, pharmacist and clinical coordinator.  Patient's plan of care was discussed; medications  were reviewed and discharge planning was addressed.       ________________________________________________________________________                 Comments         >50% of visit spent in counseling and coordination of care         ________________________________________________________________________   Toniann Ket, NP       Procedures: see electronic medical records for all procedures/Xrays and details which were not copied into this note but were reviewed prior to creation of Plan.        LABS:   I reviewed today's most current labs and imaging studies.   Pertinent labs include:     Recent Labs           05/19/18   0328     WBC  6.0     HGB  12.0     HCT  37.6        PLT  478*          Recent Labs            05/20/18   0306  05/19/18   0328     NA  139  139     K  3.5  4.0     CL  108  109*     CO2  23  23     GLU  80  69     BUN  13  9     CREA  1.03*  1.09*     CA  8.7  8.8     MG   --   1.9     PHOS   --   3.2     ALB   --   3.2*     TBILI   --   1.3*     SGOT   --   21         ALT   --   14           Signed: )Toniann Ket, NP

## 2018-05-21 NOTE — Procedures (Signed)
[facility]   [address]   [place], [place] [address]      Electroencephalogram    Procedure ID: [id number] Procedure Date:05/18/2018   Patient Name: Meghan [name] Date of Birth: [birth year]   Procedure Type: Routine Medical Record No: [id number]     INDICATION: Seizure    Medications:  Current Facility-Administered Medications   Medication Dose Route Frequency   ??? influenza vaccine 2019-20 (6 mos+)(PF) (FLUARIX/FLULAVAL/FLUZONE QUAD) injection 0.5 mL  0.5 mL IntraMUSCular PRIOR TO DISCHARGE   ??? aspirin chewable tablet 81 mg  81 mg Oral DAILY   ??? citalopram (CELEXA) tablet 20 mg  20 mg Oral DAILY   ??? donepezil (ARICEPT) tablet 10 mg  10 mg Oral DAILY   ??? fenofibrate nanocrystallized (TRICOR) tablet 48 mg  48 mg Oral DAILY   ??? fluticasone propionate (FLONASE) 50 mcg/actuation nasal spray 1 Spray  1 Spray Both Nostrils DAILY   ??? hydrALAZINE (APRESOLINE) tablet 25 mg  25 mg Oral TID PRN   ??? lacosamide (VIMPAT) tablet 100 mg  100 mg Oral Q12H   ??? memantine (NAMENDA) tablet 10 mg  10 mg Oral BID   ??? methylphenidate HCl (RITALIN) tablet 5 mg  5 mg Oral BID   ??? topiramate (TOPAMAX) tablet 100 mg  100 mg Oral DAILY   ??? ondansetron (ZOFRAN) injection 4 mg  4 mg IntraVENous Q6H PRN   ??? nitroglycerin (NITROBID) 2 % ointment 1 Inch  1 Inch Topical Q6H PRN   ??? sodium chloride (NS) flush 5-40 mL  5-40 mL IntraVENous Q8H   ??? sodium chloride (NS) flush 5-40 mL  5-40 mL IntraVENous PRN   ??? LORazepam (ATIVAN) injection 2 mg  2 mg IntraVENous Q5MIN PRN   ??? acetaminophen (TYLENOL) tablet 650 mg  650 mg Oral Q6H PRN   ??? enoxaparin (LOVENOX) injection 40 mg  40 mg SubCUTAneous Q24H   ??? divalproex DR (DEPAKOTE) tablet 500 mg  500 mg Oral Q12H       DESCRIPTION OF PROCEDURE: Electrodes were applied in accordance with the international 10-20 system of electrode placement.  EEG was reviewed in both bipolar and referential montages    DESCRIPTION OF FINDINGS: Background consists of symmetric  theta  activity. This activity attenuates with eye opening. With photic stimulation a symmetric occipital driving response is noted. No seizures or interictal hallmarks of epilepsy were noted.     IMPRESSION:  Abnormal for generalized slowing. No seizures noted.      Absence of seizures on this study does not exclude epilepsy. Clinical correlation is advised.
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
## 2018-05-22 LAB — MAGNESIUM
Magnesium: 1.9 mg/dL (ref 1.6–2.4)
Magnesium: 1.9 mg/dL (ref 1.6–2.4)

## 2018-05-22 LAB — GLUCOSE, POC
Glucose (POC): 68 mg/dL (ref 65–100)
Glucose (POC): 77 mg/dL (ref 65–100)
Glucose (POC): 80 mg/dL (ref 65–100)

## 2018-05-22 LAB — METABOLIC PANEL, BASIC
Anion gap: 5 mmol/L (ref 5–15)
BUN/Creatinine ratio: 13 (ref 12–20)
BUN: 15 MG/DL (ref 6–20)
CO2: 27 mmol/L (ref 21–32)
Calcium: 8.9 MG/DL (ref 8.5–10.1)
Chloride: 109 mmol/L — ABNORMAL HIGH (ref 97–108)
Creatinine: 1.16 MG/DL — ABNORMAL HIGH (ref 0.55–1.02)
GFR est AA: 56 mL/min/{1.73_m2} — ABNORMAL LOW (ref >60)
GFR est non-AA: 46 mL/min/{1.73_m2} — ABNORMAL LOW (ref >60)
Glucose: 61 mg/dL — ABNORMAL LOW (ref 65–100)
Potassium: 3.8 mmol/L (ref 3.5–5.1)
Sodium: 141 mmol/L (ref 136–145)

## 2018-05-22 LAB — LACOSAMIDE: Lacosamide: 3.6 ug/mL — ABNORMAL LOW (ref 5.0–10.0)

## 2018-05-22 LAB — POCT GLUCOSE
POC Glucose: 68 mg/dL (ref 65–100)
POC Glucose: 77 mg/dL (ref 65–100)
POC Glucose: 80 mg/dL (ref 65–100)

## 2018-05-22 LAB — BASIC METABOLIC PANEL
Anion Gap: 5 mmol/L (ref 5–15)
BUN: 15 MG/DL (ref 6–20)
Bun/Cre Ratio: 13 (ref 12–20)
CO2: 27 mmol/L (ref 21–32)
Calcium: 8.9 MG/DL (ref 8.5–10.1)
Chloride: 109 mmol/L — ABNORMAL HIGH (ref 97–108)
Creatinine: 1.16 MG/DL — ABNORMAL HIGH (ref 0.55–1.02)
EGFR IF NonAfrican American: 46 mL/min/{1.73_m2} — ABNORMAL LOW (ref >60)
GFR African American: 56 mL/min/{1.73_m2} — ABNORMAL LOW (ref >60)
Glucose: 61 mg/dL — ABNORMAL LOW (ref 65–100)
Potassium: 3.8 mmol/L (ref 3.5–5.1)
Sodium: 141 mmol/L (ref 136–145)

## 2018-05-22 MED FILL — NAMENDA 10 MG TABLET: 10 mg | ORAL | Qty: 1

## 2018-05-22 MED FILL — VIMPAT 50 MG TABLET: 50 mg | ORAL | Qty: 2

## 2018-05-22 MED FILL — METHYLPHENIDATE 5 MG TAB: 5 mg | ORAL | Qty: 1

## 2018-05-22 MED FILL — CHILDREN'S ASPIRIN 81 MG CHEWABLE TABLET: 81 mg | ORAL | Qty: 1

## 2018-05-22 MED FILL — CITALOPRAM 20 MG TAB: 20 mg | ORAL | Qty: 1

## 2018-05-22 MED FILL — DEPAKOTE 250 MG TABLET,DELAYED RELEASE: 250 mg | ORAL | Qty: 2

## 2018-05-22 MED FILL — TOPIRAMATE 100 MG TAB: 100 mg | ORAL | Qty: 1

## 2018-05-22 MED FILL — BD POSIFLUSH NORMAL SALINE 0.9 % INJECTION SYRINGE: INTRAMUSCULAR | Qty: 10

## 2018-05-22 MED FILL — ENOXAPARIN 40 MG/0.4 ML SUB-Q SYRINGE: 40 mg/0.4 mL | SUBCUTANEOUS | Qty: 0.4

## 2018-05-22 MED FILL — TRICOR 48 MG TABLET: 48 mg | ORAL | Qty: 1

## 2018-05-22 MED FILL — ARICEPT 5 MG TABLET: 5 mg | ORAL | Qty: 2
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
## 2018-05-22 NOTE — Telephone Encounter (Signed)
Prior Auth submitted for Pamipexole 4.5mg  ER to [employer] Part D via Cover My Meds. Status Pending. Allow 24-72 hours for response.     CMM Key: [id number]  Case #: [id number]

## 2018-05-22 NOTE — Progress Notes (Signed)
Problem: Mobility Impaired (Adult and Pediatric)  Goal: *Acute Goals and Plan of Care (Insert Text)  Description  FUNCTIONAL STATUS PRIOR TO ADMISSION: The patient was ambulatory without AD, but did need supervision and assistance from family members    HOME SUPPORT PRIOR TO ADMISSION: Per chart review, the patient lived with her daughter and granddaughter and her sister was coming by on the weekends. Per CM notes, pt's family having difficulty providing the level of care the patient needs at home.  Pt was at LTC over the summer, but family brought her home to care for her.    Physical Therapy Goals  Initiated 05/19/2018  1.  Patient will move from supine to sit and sit to supine  in bed with supervision/set-up within 7 day(s).    2.  Patient will transfer from bed to chair and chair to bed with supervision/set-up using the least restrictive device within 7 day(s).  3.  Patient will perform sit to stand with supervision/set-up within 7 day(s).  4.  Patient will ambulate with supervision/set-up for 120 feet with the least restrictive device within 7 day(s).   5.  Patient will ascend/descend 1 stairs with no handrail(s) with supervision/set-up within 7 day(s).     Outcome: Progressing Towards Goal   PHYSICAL THERAPY TREATMENT  Patient: Meghan [name] (72 y.o. female)  Date: 05/22/2018  Diagnosis: Recurrent seizures (HCC) [G40.909] <principal problem not specified>       Precautions: Fall, Seizure  Chart, physical therapy assessment, plan of care and goals were reviewed.    ASSESSMENT  Patient continues with skilled PT services and is progressing towards goals. Patient is received supine in bed and awake. She requires VC for all functional mobility. Patient is assisted in to restroom with verbal cues for safe transfers. Soiled brief is changed and patient requires constant verbal cues for hygiene tasks. Patient declines farther mobility at this time requesting to eat her breakfast.       Current Level of Function Impacting Discharge (mobility/balance): supervision with constant verbal cues for task at hand     Other factors to consider for discharge: safety/cognition, increased risk for falls          PLAN :  Patient continues to benefit from skilled intervention to address the above impairments.  Continue treatment per established plan of care.  to address goals.    Recommendation for discharge: (in order for the patient to meet his/her long term goals)  To be determined: LTC     This discharge recommendation:  Has been made in collaboration with the attending provider and/or case management    Equipment recommendations for successful discharge (if) home: none       SUBJECTIVE:   Patient stated ???i'll sit down and eat.???    OBJECTIVE DATA SUMMARY:   Critical Behavior:  Neurologic State: Alert  Orientation Level: Oriented X4  Cognition: Follows commands, Appropriate safety awareness  Safety/Judgement: Awareness of environment  Functional Mobility Training:  Bed Mobility:     Supine to Sit: Supervision     Scooting: Independent        Transfers:  Sit to Stand: Supervision  Stand to Sit: Supervision        Bed to Chair: Supervision                    Balance:  Sitting: Intact  Standing: Impaired  Standing - Static: Good  Standing - Dynamic : Good;Fair  Ambulation/Gait Training:  Distance (ft): 20 Feet (ft)  Assistive Device: Gait belt  Ambulation - Level of Assistance: Contact guard assistance        Gait Abnormalities: Decreased step clearance        Base of Support: Narrowed;Center of gravity altered     Speed/Cadence: Slow;Shuffled  Step Length: Right shortened;Left shortened                    Stairs:              Therapeutic Exercises:     Pain Rating:      Activity Tolerance:   Fair  Please refer to the flowsheet for vital signs taken during this treatment.    After treatment patient left in no apparent distress:   Sitting in chair, Call bell within reach and Bed / chair alarm activated     COMMUNICATION/COLLABORATION:   The patient???s plan of care was discussed with: Registered Nurse    Coral Ceo, PT   Time Calculation: 23 mins

## 2018-05-22 NOTE — Progress Notes (Addendum)
I have reviewed pertinent labs and imaging, discussed/agreed on the plan of care with Dr. Riley Kill.         Hospitalist Progress Note    NAME: Meghan [name]   DOB:  Feb 18, 1946   MRN:  [id number]       Interim Hospital Summary: 72 y.o. female whom presented on 05/18/2018 with unwitnessed fall prior to arrival.     Assessment / Plan: CM looking for LTC placement; Awaiting decision from [facility] for placement   Acute encephalopathy ??secondary to ??witnessed????seizure   ???Neurology following  ???Continue Depakote and Vimpat   ???05/20/18 EEG    IMPRESSION: Abnormal for generalized slowing. No seizures noted.    Absence of seizures on this study does not exclude epilepsy. Clinical correlation is advised.  ???Continue aspirin  ???PT OT evaluation  -LTC placement pending  ???Seizure precaution  -Awake and Alert; slow to respond  -05/16/18 Urine Cx >100,000 MIXED UROGENITAL FLORA ISOLATED  COLONIES/mL  -05/20/18 VPA 97  -05/18/18 Head CT IMPRESSION: No acute intracranial abnormality;No indication for MRI brain     ??  S/P Fall PTA  -Fall preacutions  ??  ??  Mild renal insufficiency, resolving  -IVF completed  -Creatinine 05/21/18 1.16   -Continue to Hold ACEI  -BMP daily       Hypertension  -ACEI on hold   -PRN??nitro patch??and hydralazine   -B/P 113/69      Hyperipidemia, stable  -Continue tricor        Dementia??  ???Continue Aricept and Namenda  -More alert today and responding verbally      Depression, recurrent stable  -Continue Celexa  -Continue Ritalin       25.0 - 29.9 Overweight / Body mass index is 29.13 kg/m??.    Code status: Full  Prophylaxis: Lovenox  Recommended Disposition: SNF/LTC; CM looking for LTC Placement     Subjective:     Chief Complaint / Reason for Physician Visit  F/U on LTC placement . She is more alert today, " I want to get out of here"l, . Dementia at baseline.  Discussed with RN events overnight.     Review of Systems:  Symptom Y/N Comments  Symptom Y/N Comments   Fever/Chills N   Chest Pain N     Poor Appetite N   Edema N    Cough N   Abdominal Pain N    Sputum    Joint Pain     SOB/DOE    Pruritis/Rash     Nausea/vomit    Tolerating PT/OT Y    Diarrhea    Tolerating Diet Y    Constipation    Other       Could NOT obtain due to: Dementia     Objective:     VITALS:   Last 24hrs VS reviewed since prior progress note. Most recent are:  Patient Vitals for the past 24 hrs:   Temp Pulse Resp BP SpO2   05/22/18 1118 97.5 ??F (36.4 ??C) 70 16 113/69 97 %   05/22/18 0723 97.7 ??F (36.5 ??C) 66 16 120/66 100 %   05/22/18 0435 97.8 ??F (36.6 ??C) 72 16 101/57 100 %   05/22/18 0021 98.2 ??F (36.8 ??C) 65 16 115/71 99 %   05/21/18 1957 98.3 ??F (36.8 ??C) 83 16 122/66 100 %   05/21/18 1533 98.7 ??F (37.1 ??C) 67 ??? 122/71 100 %     No intake or output data in the 24 hours ending 05/22/18  1238     PHYSICAL EXAM:  General: Awake and Alert, cooperative, NAD  EENT:  EOMI. Anicteric sclerae. MMM  Resp:  CTA bilaterally, no wheezing or rales.  No accessory muscle use  CV:  Regular  rhythm,  No edema  GI:  Soft, Non distended, Non tender.  +Bowel sounds  Neurologic:  Alert, UTA mental status due to Dementia   Psych:   Not anxious nor agitated  Skin:  No rashes.  No jaundice    Reviewed most current lab test results and cultures  YES  Reviewed most current radiology test results   YES  Review and summation of old records today    NO  Reviewed patient's current orders and MAR    YES  PMH/SH reviewed - no change compared to H&P  ________________________________________________________________________  Care Plan discussed with:    Comments   Patient Y    Family      RN Y    Care Manager     Consultant                        Multidiciplinary team rounds were held today with case manager, nursing, pharmacist and clinical coordinator.  Patient's plan of care was discussed; medications were reviewed and discharge planning was addressed.     ________________________________________________________________________        Comments    >50% of visit spent in counseling and coordination of care     ________________________________________________________________________  Toniann Ket, NP     Procedures: see electronic medical records for all procedures/Xrays and details which were not copied into this note but were reviewed prior to creation of Plan.      LABS:  I reviewed today's most current labs and imaging studies.  Pertinent labs include:  No results for input(s): WBC, HGB, HCT, PLT, HGBEXT, HCTEXT, PLTEXT, HGBEXT, HCTEXT, PLTEXT in the last 72 hours.  Recent Labs     05/22/18  0435 05/20/18  0306   NA 141 139   K 3.8 3.5   CL 109* 108   CO2 27 23   GLU 61* 80   BUN 15 13   CREA 1.16* 1.03*   CA 8.9 8.7   MG 1.9  --        Signed: )Toniann Ket, NP

## 2018-05-22 NOTE — Progress Notes (Signed)
Problem: Falls - Risk of  Goal: *Absence of Falls  Description  Document [name] Fall Risk and appropriate interventions in the flowsheet.  Outcome: Progressing Towards Goal  Note:   Fall Risk Interventions:  Mobility Interventions: Bed/chair exit alarm, Patient to call before getting OOB    Mentation Interventions: Adequate sleep, hydration, pain control, Bed/chair exit alarm    Medication Interventions: Bed/chair exit alarm, Evaluate medications/consider consulting pharmacy, Patient to call before getting OOB    Elimination Interventions: Bed/chair exit alarm, Call light in reach, Patient to call for help with toileting needs    History of Falls Interventions: Bed/chair exit alarm         Problem: Patient Education: Go to Patient Education Activity  Goal: Patient/Family Education  Outcome: Progressing Towards Goal     Problem: Pressure Injury - Risk of  Goal: *Prevention of pressure injury  Description  Document Braden Scale and appropriate interventions in the flowsheet.  Outcome: Progressing Towards Goal  Note:   Pressure Injury Interventions:  Sensory Interventions: Assess changes in LOC    Moisture Interventions: Absorbent underpads    Activity Interventions: Increase time out of bed, PT/OT evaluation    Mobility Interventions: HOB 30 degrees or less, PT/OT evaluation    Nutrition Interventions: Document food/fluid/supplement intake    Friction and Shear Interventions: Minimize layers                Problem: Seizure Disorder (Adult)  Goal: *STG: Remains free of seizure activity  Outcome: Progressing Towards Goal  Goal: *STG: Maintains lab values within therapeutic range  Outcome: Progressing Towards Goal  Goal: *STG/LTG: Complies with medication therapy  Outcome: Progressing Towards Goal  Goal: *STG: Remains free of injury during seizure activity  Outcome: Progressing Towards Goal  Goal: *STG: Remains safe in hospital  Outcome: Progressing Towards Goal  Goal: Interventions  Outcome: Progressing Towards Goal

## 2018-05-22 NOTE — Progress Notes (Signed)
Morning blood sugar level 61. Rechecked with bedside glucometer. Orange juice given.
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
## 2018-05-22 NOTE — Progress Notes (Signed)
Problem: Falls - Risk of  Goal: *Absence of Falls  Description  Document Meghan [name] Fall Risk and appropriate interventions in the flowsheet.  Outcome: Progressing Towards Goal  Note:   Fall Risk Interventions:  Mobility Interventions: Bed/chair exit alarm    Mentation Interventions: Adequate sleep, hydration, pain control    Medication Interventions: Bed/chair exit alarm    Elimination Interventions: Bed/chair exit alarm    History of Falls Interventions: Bed/chair exit alarm         Problem: Patient Education: Go to Patient Education Activity  Goal: Patient/Family Education  Outcome: Progressing Towards Goal     Problem: Pressure Injury - Risk of  Goal: *Prevention of pressure injury  Description  Document Braden Scale and appropriate interventions in the flowsheet.  Outcome: Progressing Towards Goal  Note:   Pressure Injury Interventions:  Sensory Interventions: Assess changes in LOC    Moisture Interventions: Absorbent underpads    Activity Interventions: PT/OT evaluation    Mobility Interventions: PT/OT evaluation    Nutrition Interventions: Document food/fluid/supplement intake    Friction and Shear Interventions: Minimize layers                Problem: Patient Education: Go to Patient Education Activity  Goal: Patient/Family Education  Outcome: Progressing Towards Goal     Problem: Seizure Disorder (Adult)  Goal: *STG: Remains free of seizure activity  Outcome: Progressing Towards Goal  Goal: *STG: Maintains lab values within therapeutic range  Outcome: Progressing Towards Goal  Goal: *STG/LTG: Complies with medication therapy  Outcome: Progressing Towards Goal  Goal: *STG: Remains free of injury during seizure activity  Outcome: Progressing Towards Goal  Goal: *STG: Remains safe in hospital  Outcome: Progressing Towards Goal  Goal: Interventions  Outcome: Progressing Towards Goal     Problem: Patient Education: Go to Patient Education Activity  Goal: Patient/Family Education  Outcome: Progressing Towards Goal      Problem: Patient Education: Go to Patient Education Activity  Goal: Patient/Family Education  Outcome: Progressing Towards Goal     Problem: Patient Education: Go to Patient Education Activity  Goal: Patient/Family Education  Outcome: Progressing Towards Goal

## 2018-05-22 NOTE — Progress Notes (Signed)
TOC Plan--LTC    [facility] has accepted patient for long term care. They will start insurance authorization today. Called patient's daughter and made her aware and she was in agreement.    Vernice C.Stowers-Yerby  RN BSN CRM    Case Manager    [phone]

## 2018-05-22 NOTE — Progress Notes (Signed)
Morning blood sugar level 61. Rechecked with bedside glucometer. Orange juice given.

## 2018-05-22 NOTE — Progress Notes (Signed)
Progress Notes by Toniann Ket, NP at 05/22/18 1238                Author: Toniann Ket, NP  Service: Internal Medicine  Author Type: Nurse Practitioner       Filed: 05/23/18 1610  Date of Service: 05/22/18 1238  Status: Addendum          Editor: Toniann Ket, NP (Nurse Practitioner)          Related Notes: Original Note by Toniann Ket, NP (Nurse Practitioner) filed at 05/22/18 1244               I have reviewed pertinent labs and imaging, discussed/agreed on the plan of care with Dr. Riley Kill.               Hospitalist Progress Note      NAME: Meghan [name]    DOB:  22-Jan-1946    MRN:  [id number]            Interim Hospital Summary: 72 y.o.  female whom presented on 05/18/2018 with unwitnessed fall prior to arrival.        Assessment / Plan: CM looking for LTC placement; Awaiting decision from [facility] for placement      Acute encephalopathy ??secondary to ??witnessed????seizure   -Neurology following   -Continue Depakote and Vimpat   -05/20/18 EEG     IMPRESSION: Abnormal for generalized slowing. No seizures noted.         Absence of seizures on this study does not exclude epilepsy. Clinical correlation is advised.   -Continue aspirin  -PT OT evaluation   -LTC placement pending  -Seizure precaution   -Awake and Alert; slow to respond   -05/16/18 Urine Cx >100,000 MIXED UROGENITAL FLORA ISOLATED  COLONIES/mL   -05/20/18 VPA 97   -05/18/18 Head CT IMPRESSION: No acute intracranial abnormality;No indication for MRI brain       ??   S/P Fall PTA   -Fall preacutions  ??   ??   Mild renal insufficiency, resolving   -IVF completed   -Creatinine 05/21/18 1.16    -Continue to Hold ACEI   -BMP daily         Hypertension  -ACEI on hold   -PRN??nitro patch??and hydralazine    -B/P 113/69         Hyperipidemia, stable   -Continue tricor          Dementia??  -Continue Aricept and Namenda   -More alert today and responding verbally         Depression, recurrent stable   -Continue Celexa   -Continue Ritalin           25.0 - 29.9 Overweight / Body mass index is 29.13 kg/m??.      Code status: Full   Prophylaxis: Lovenox   Recommended Disposition: SNF/LTC; CM looking for LTC Placement          Subjective:        Chief Complaint / Reason for Physician Visit   F/U on LTC placement . She is more alert today, " I want to get out of here"l, . Dementia at baseline.  Discussed with RN events overnight.       Review of Systems:           Symptom  Y/N  Comments    Symptom  Y/N  Comments             Fever/Chills  N      Chest Pain  N               Poor Appetite  N      Edema  N               Cough  N      Abdominal Pain  N       Sputum        Joint Pain         SOB/DOE        Pruritis/Rash         Nausea/vomit        Tolerating PT/OT  Y       Diarrhea        Tolerating Diet  Y               Constipation        Other               Could NOT obtain due to:  Dementia          Objective:        VITALS:    Last 24hrs VS reviewed since prior progress note. Most recent are:   Patient Vitals for the past 24 hrs:            Temp  Pulse  Resp  BP  SpO2            05/22/18 1118  97.5 ??F (36.4 ??C)  70  16  113/69  97 %            05/22/18 0723  97.7 ??F (36.5 ??C)  66  16  120/66  100 %     05/22/18 0435  97.8 ??F (36.6 ??C)  72  16  101/57  100 %     05/22/18 0021  98.2 ??F (36.8 ??C)  65  16  115/71  99 %     05/21/18 1957  98.3 ??F (36.8 ??C)  83  16  122/66  100 %            05/21/18 1533  98.7 ??F (37.1 ??C)  67  --  122/71  100 %        No intake or output data in the 24 hours ending 05/22/18 1238       PHYSICAL EXAM:   General: Awake and Alert, cooperative, NAD   EENT:  EOMI. Anicteric sclerae. MMM   Resp:  CTA bilaterally, no wheezing or rales.  No accessory muscle use   CV:  Regular  rhythm,  No edema   GI:  Soft, Non distended, Non tender.  +Bowel sounds   Neurologic:  Alert, UTA mental status due to Dementia    Psych:   Not anxious nor agitated   Skin:  No rashes.  No jaundice      Reviewed most current lab test results and cultures  YES    Reviewed most current radiology test results   YES   Review and summation of old records today    NO   Reviewed patient's current orders and MAR    YES   PMH/SH reviewed - no change compared to H&P   ________________________________________________________________________   Care Plan discussed with:           Comments         Patient  Y           Family  RN  Y       Care Manager             Consultant                                  Multidiciplinary team rounds were held today with case manager, nursing, pharmacist and clinical coordinator.  Patient's plan of care was discussed; medications  were reviewed and discharge planning was addressed.       ________________________________________________________________________                 Comments         >50% of visit spent in counseling and coordination of care         ________________________________________________________________________   Toniann Ket, NP       Procedures: see electronic medical records for all procedures/Xrays and details which were not copied into this note but were reviewed prior to creation of Plan.        LABS:   I reviewed today's most current labs and imaging studies.   Pertinent labs include:   No results for input(s): WBC, HGB, HCT, PLT, HGBEXT, HCTEXT, PLTEXT, HGBEXT, HCTEXT, PLTEXT in the last 72 hours.     Recent Labs            05/22/18   0435  05/20/18   0306     NA  141  139     K  3.8  3.5     CL  109*  108     CO2  27  23     GLU  61*  80     BUN  15  13     CREA  1.16*  1.03*     CA  8.9  8.7         MG  1.9   --            Signed: )Toniann Ket, NP

## 2018-05-22 NOTE — Progress Notes (Signed)
 Problem: Mobility Impaired (Adult and Pediatric)  Goal: *Acute Goals and Plan of Care (Insert Text)  Description  FUNCTIONAL STATUS PRIOR TO ADMISSION: The patient was ambulatory without AD, but did need supervision and assistance from family members    HOME SUPPORT PRIOR TO ADMISSION: Per chart review, the patient lived with her daughter and granddaughter and her sister was coming by on the weekends. Per CM notes, pt's family having difficulty providing the level of care the patient needs at home.  Pt was at LTC over the summer, but family brought her home to care for her.    Physical Therapy Goals  Initiated 05/19/2018  1.  Patient will move from supine to sit and sit to supine  in bed with supervision/set-up within 7 day(s).    2.  Patient will transfer from bed to chair and chair to bed with supervision/set-up using the least restrictive device within 7 day(s).  3.  Patient will perform sit to stand with supervision/set-up within 7 day(s).  4.  Patient will ambulate with supervision/set-up for 120 feet with the least restrictive device within 7 day(s).   5.  Patient will ascend/descend 1 stairs with no handrail(s) with supervision/set-up within 7 day(s).     Outcome: Progressing Towards Goal   PHYSICAL THERAPY TREATMENT  Patient: Meghan [name] (72 y.o. female)  Date: 05/22/2018  Diagnosis: Recurrent seizures (HCC) [G40.909] <principal problem not specified>       Precautions: Fall, Seizure  Chart, physical therapy assessment, plan of care and goals were reviewed.    ASSESSMENT  Patient continues with skilled PT services and is progressing towards goals. Patient is received supine in bed and awake. She requires VC for all functional mobility. Patient is assisted in to restroom with verbal cues for safe transfers. Soiled brief is changed and patient requires constant verbal cues for hygiene tasks. Patient declines farther mobility at this time requesting to eat her breakfast.      Current Level of Function  Impacting Discharge (mobility/balance): supervision with constant verbal cues for task at hand     Other factors to consider for discharge: safety/cognition, increased risk for falls          PLAN :  Patient continues to benefit from skilled intervention to address the above impairments.  Continue treatment per established plan of care.  to address goals.    Recommendation for discharge: (in order for the patient to meet his/her long term goals)  To be determined: LTC     This discharge recommendation:  Has been made in collaboration with the attending provider and/or case management    Equipment recommendations for successful discharge (if) home: none       SUBJECTIVE:   Patient stated "i'll sit down and eat."    OBJECTIVE DATA SUMMARY:   Critical Behavior:  Neurologic State: Alert  Orientation Level: Oriented X4  Cognition: Follows commands, Appropriate safety awareness  Safety/Judgement: Awareness of environment  Functional Mobility Training:  Bed Mobility:     Supine to Sit: Supervision     Scooting: Independent        Transfers:  Sit to Stand: Supervision  Stand to Sit: Supervision        Bed to Chair: Supervision                    Balance:  Sitting: Intact  Standing: Impaired  Standing - Static: Good  Standing - Dynamic : Good;Fair  Ambulation/Gait Training:  Distance (ft): 20 Feet (ft)  Assistive Device: Gait belt  Ambulation - Level of Assistance: Contact guard assistance        Gait Abnormalities: Decreased step clearance        Base of Support: Narrowed;Center of gravity altered     Speed/Cadence: Slow;Shuffled  Step Length: Right shortened;Left shortened                    Stairs:              Therapeutic Exercises:     Pain Rating:      Activity Tolerance:   Fair  Please refer to the flowsheet for vital signs taken during this treatment.    After treatment patient left in no apparent distress:   Sitting in chair, Call bell within reach and Bed / chair alarm activated    COMMUNICATION/COLLABORATION:    The patient's plan of care was discussed with: Registered Nurse    Harlene Pesa, PT   Time Calculation: 23 mins
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
## 2018-05-22 NOTE — Progress Notes (Signed)
TOC Plan--LTC    [facility] has accepted patient for long term care. They will start insurance authorization today. Called patient's daughter and made her aware and she was in agreement.    Vernice C.Stowers-Yerby  RN BSN CRM    Case Manager    [phone]

## 2018-05-22 NOTE — Progress Notes (Signed)
Problem: Falls - Risk of  Goal: *Absence of Falls  Description  Document Meghan [name] Fall Risk and appropriate interventions in the flowsheet.  Outcome: Progressing Towards Goal  Note:   Fall Risk Interventions:  Mobility Interventions: Bed/chair exit alarm    Mentation Interventions: Adequate sleep, hydration, pain control    Medication Interventions: Bed/chair exit alarm    Elimination Interventions: Bed/chair exit alarm    History of Falls Interventions: Bed/chair exit alarm         Problem: Patient Education: Go to Patient Education Activity  Goal: Patient/Family Education  Outcome: Progressing Towards Goal     Problem: Pressure Injury - Risk of  Goal: *Prevention of pressure injury  Description  Document Braden Scale and appropriate interventions in the flowsheet.  Outcome: Progressing Towards Goal  Note:   Pressure Injury Interventions:  Sensory Interventions: Assess changes in LOC    Moisture Interventions: Absorbent underpads    Activity Interventions: PT/OT evaluation    Mobility Interventions: PT/OT evaluation    Nutrition Interventions: Document food/fluid/supplement intake    Friction and Shear Interventions: Minimize layers                Problem: Patient Education: Go to Patient Education Activity  Goal: Patient/Family Education  Outcome: Progressing Towards Goal     Problem: Seizure Disorder (Adult)  Goal: *STG: Remains free of seizure activity  Outcome: Progressing Towards Goal  Goal: *STG: Maintains lab values within therapeutic range  Outcome: Progressing Towards Goal  Goal: *STG/LTG: Complies with medication therapy  Outcome: Progressing Towards Goal  Goal: *STG: Remains free of injury during seizure activity  Outcome: Progressing Towards Goal  Goal: *STG: Remains safe in hospital  Outcome: Progressing Towards Goal  Goal: Interventions  Outcome: Progressing Towards Goal     Problem: Patient Education: Go to Patient Education Activity  Goal: Patient/Family Education  Outcome: Progressing Towards  Goal     Problem: Patient Education: Go to Patient Education Activity  Goal: Patient/Family Education  Outcome: Progressing Towards Goal     Problem: Patient Education: Go to Patient Education Activity  Goal: Patient/Family Education  Outcome: Progressing Towards Goal

## 2018-05-22 NOTE — Progress Notes (Signed)
Problem: Falls - Risk of  Goal: *Absence of Falls  Description  Document Meghan [name] Fall Risk and appropriate interventions in the flowsheet.  Outcome: Progressing Towards Goal  Note:   Fall Risk Interventions:  Mobility Interventions: Bed/chair exit alarm, Patient to call before getting OOB    Mentation Interventions: Adequate sleep, hydration, pain control, Bed/chair exit alarm    Medication Interventions: Bed/chair exit alarm, Evaluate medications/consider consulting pharmacy, Patient to call before getting OOB    Elimination Interventions: Bed/chair exit alarm, Call light in reach, Patient to call for help with toileting needs    History of Falls Interventions: Bed/chair exit alarm         Problem: Patient Education: Go to Patient Education Activity  Goal: Patient/Family Education  Outcome: Progressing Towards Goal     Problem: Pressure Injury - Risk of  Goal: *Prevention of pressure injury  Description  Document Braden Scale and appropriate interventions in the flowsheet.  Outcome: Progressing Towards Goal  Note:   Pressure Injury Interventions:  Sensory Interventions: Assess changes in LOC    Moisture Interventions: Absorbent underpads    Activity Interventions: Increase time out of bed, PT/OT evaluation    Mobility Interventions: HOB 30 degrees or less, PT/OT evaluation    Nutrition Interventions: Document food/fluid/supplement intake    Friction and Shear Interventions: Minimize layers                Problem: Seizure Disorder (Adult)  Goal: *STG: Remains free of seizure activity  Outcome: Progressing Towards Goal  Goal: *STG: Maintains lab values within therapeutic range  Outcome: Progressing Towards Goal  Goal: *STG/LTG: Complies with medication therapy  Outcome: Progressing Towards Goal  Goal: *STG: Remains free of injury during seizure activity  Outcome: Progressing Towards Goal  Goal: *STG: Remains safe in hospital  Outcome: Progressing Towards Goal  Goal: Interventions  Outcome: Progressing Towards Goal

## 2018-05-23 MED ORDER — LACOSAMIDE 100 MG TAB
100 mg | ORAL_TABLET | ORAL | 0 refills | Status: AC
Start: 2018-05-23 — End: ?

## 2018-05-23 MED FILL — BD POSIFLUSH NORMAL SALINE 0.9 % INJECTION SYRINGE: INTRAMUSCULAR | Qty: 40

## 2018-05-23 MED FILL — DEPAKOTE 250 MG TABLET,DELAYED RELEASE: 250 mg | ORAL | Qty: 2

## 2018-05-23 MED FILL — NAMENDA 10 MG TABLET: 10 mg | ORAL | Qty: 1

## 2018-05-23 MED FILL — ARICEPT 5 MG TABLET: 5 mg | ORAL | Qty: 2

## 2018-05-23 MED FILL — FLUARIX QUAD 2019-2020 (PF) 60 MCG (15 MCG X 4)/0.5 ML IM SYRINGE: 60 mcg (15 mcg x 4)/0.5 mL | INTRAMUSCULAR | Qty: 0.5

## 2018-05-23 MED FILL — CITALOPRAM 20 MG TAB: 20 mg | ORAL | Qty: 1

## 2018-05-23 MED FILL — VIMPAT 50 MG TABLET: 50 mg | ORAL | Qty: 2

## 2018-05-23 MED FILL — METHYLPHENIDATE 5 MG TAB: 5 mg | ORAL | Qty: 1

## 2018-05-23 MED FILL — TRICOR 48 MG TABLET: 48 mg | ORAL | Qty: 1

## 2018-05-23 MED FILL — TOPIRAMATE 100 MG TAB: 100 mg | ORAL | Qty: 1

## 2018-05-23 MED FILL — CHILDREN'S ASPIRIN 81 MG CHEWABLE TABLET: 81 mg | ORAL | Qty: 1

## 2018-05-23 NOTE — Discharge Summary (Addendum)
Hospitalist Discharge Summary     Patient ID:  Meghan [name]  [id number]  72 y.o.  1945/12/26  05/18/2018    PCP on record: Gwynn Burly, NP    Admit date: 05/18/2018  Discharge date and time: 05/23/2018    DISCHARGE DIAGNOSIS:    Acute encephalopathy ??secondary to ??witnessed????seizure   S/P Fall  Mild renal insufficiency  HTN  Dementia   HLD  Depression     CONSULTATIONS:  IP CONSULT TO NEUROLOGY  IP CONSULT TO NEUROLOGY  IP CONSULT TO HOSPITALIST    Excerpted HPI from H&P of Meghan Lenis, MD:    Meghan [name] is a 72 y.o. with past medical history as documented below presents to the ED with c/o of unwitnessed fall prior to arrival. ??Patient was found by patient's sister on the floor trying to get up to use the bathroom. ??She denies hitting her head. ??denied neck pain no sings of trauma per er-  Patient in the er multiple times for multiple reasons.unsure on compliance with meds; records indicate that she takesDepakote/Topmax/vimpat for her seizure disorder. No reports fevers, headaches, blurry vision,headache, nausea, vomiting, abdominal pain, CP, SOB, lightheadedness, dizziness, numbness, weakness, BLE swelling, heart palpitations, urinary sxs, diarrhea, constipation, melena, hematochezia, cough, or congestion.   While in the er pt had 2 witnessed seizures controlled with Ativan 2 mg ,pt now is post ictal.  No family in the room.     ______________________________________________________________________  DISCHARGE SUMMARY/HOSPITAL COURSE:  for full details see H&P, daily progress notes, labs, consult notes.       [name] was admitted on 05/18/18 after a witnessed fall. While in the ER she had 2 witnessed seizures. She was given Ativan 2 mg, which stopped seizures. She was admitted with seizures and encephalopathy. Encephalopathy attributed to recent seizure activity. Head CT 05/18/18 IMPRESSION: No acute intracranial abnormality. EKG 10/5/19Normal sinus  rhythm Normal ECG . Topiramate level 3.4 on admission.  VPA on 05/18/18 30, repeat level drawn on 05/20/18 was 97. Ammonia Level <10. Neuro consulted. EEG results IMPRESSION: Abnormal for generalized slowing. No seizures noted. No further seizures noted during this hospital admission. She has been clinically stable for discharge since 05/20/18. It was unclear if she had been taking her seizure meds PTA. Current meds for seizures prevention are VPA and  Vimpat, She has a history of Dementia being managed with Aricept and Namenda. She  She will be discharged to [facility].       Acute encephalopathy ??secondary to ??witnessed????seizure, Improved   -Continue Vimpat and VPA  -F/U with neurology  -Continue seizure precautions      S/P Fall  -Fall Precautions continued  -Will be discharged to a LTC facilty      Mild renal insufficiency, improving   -Monitor renal function per LTC facility  -Stable at discharge  -Avoiding Nephrotoxins      HTN, stable  -Currently on no meds  -B/P stable    Hyperlipidemia, Stable  -Continue Tricor      Dementia   -Continue Aricept and Namenda  -Will be discharged to a LTC facilty      Depression  -Continue Celexa    _______________________________________________________________________  Patient seen and examined by me on discharge day.  Pertinent Findings:  Gen:    Not in distress  Chest: Clear lungs  CVS:   Regular rhythm.  No edema  Abd:  Soft, not distended, not tender  Neuro:  Alert, Oriented to self only  _______________________________________________________________________  DISCHARGE MEDICATIONS:   Current Discharge Medication List            Patient Follow Up Instructions:   Activity: Activity as tolerated  Diet: Regular Diet  Wound Care: None needed    Follow-up with Facility Provider in 2 days. Also f/u with Neurology for seizure monitoring   Follow-up tests/labs per facility   Follow-up Information    None        ________________________________________________________________    Risk of deterioration: Low    Condition at Discharge:  Stable  __________________________________________________________________    Disposition  SNF/LTC    ____________________________________________________________________    Code Status: Full Code  ___________________________________________________________________      Total time in minutes spent coordinating this discharge (includes going over instructions, follow-up, prescriptions, and preparing report for sign off to her PCP) :  45 minutes    Signed:  Toniann KetKeisha Q Tamirra Sienkiewicz, NP

## 2018-05-23 NOTE — Progress Notes (Signed)
TOC Plan--Long term Care.    [facility] has received insurance authorization per liason and can accept patient today if medically stable. Called and made NP aware and patient will be discharging today. Called patient's daughter and made her aware and she is in agreement. Verbal consent for FOC and second medicare im letter explained to daughter with right to appeal and she agreement for discharge to [facility].  Copy of second medicare letter placed on chart and copy left in patient's room. FOC verbal consent signed and placed on chart.  PCS completed with medicals and given to nursing. Ref[facility] sent to [facility] and will await their response.  Nursing to call report to [phone].    Vernice C.Stowers-Yerby  RN BSN CRM    Case Manager    [phone]

## 2018-05-23 NOTE — Other (Signed)
TRANSFER - OUT REPORT:    Verbal report given to GrenadaBrittany (name) on Christiana FuchsBarbara J Staiger  being transferred to [facility] for routine progression of care       Report consisted of patient???s Situation, Background, Assessment and   Recommendations(SBAR).     Information from the following report(s) SBAR was reviewed with the receiving nurse.    Lines:       Opportunity for questions and clarification was provided.      Patient transported via [facility] by [facility]

## 2018-05-23 NOTE — Progress Notes (Signed)
TOC Plan--Long Term Care    [facility] transport patient at 1200 noon today to [place]. Called facility and made them aware. Called daughter, Meghan [name] and informed her.    Vernice C.Stowers-Yerby  RN BSN CRM    Case Manager    [phone]    Transition of Care Plan to SNF/Rehab    SNF/Rehab Transition:  Patient has been accepted to [facility] and Rehab and meets criteria for admission.   [facility] will transported by [facility] and expected to leave at [place] to Patient/Family:  Met with patient and spoke with daughter on the telephone and they are agreeable to the transition plan.    Communication to SNF/Rehab:  Bedside RN, Herbie Ford, has been notified to update the transition plan to the facility and call report (phone number [phone]  Discharge information has been updated on the AVS.     Discharge instructions to be fax'd to facility at [facility] # [phone]     SNF/Rehab Transition:  Patient to follow-up with Home Health: [facility],   PCP/Specialist: Dr. Emeline Gins  Ambulatory Care Management:     Reviewed and confirmed with facility, nursing  they can manage the patient care needs for the following:     Elta [place] with (X) only those applicable:    Medication:  _0   Medications will be available at the facility  _1   IV Antibiotics   _2   Controlled Substance - hard copy to be sent with patient   _3   Weekly Labs   Documents:  _4  Hard RX  _5  MAR  _6  Kardex  _7  AVS  _8 Transfer Summary  _9 Discharge   Equipment:  _10   CPAP/BiPAP  _11   Wound Vacuum  _12   Foley or Urinary Device  _13   PICC/Central Line  _14   Nebulizer  _15   Ventilator   Treatment:  _16 Isolation (for MRSA, VRE, etc.)  _17 Surgical Drain Management  _18 Tracheostomy Care  _19 Dressing Changes  _20 Dialysis with transportation and chair time   _21 PEG Care  _22 Oxygen  _23 Daily Weights for Heart Failure   Dietary:  _24 Any diet limitations  _25 Tube Feedings   _26 Total Parenteral Management (TPN)    Eligible for Medicaid Long Term Services and Supports  Yes:  _27  Eligible for medical assistance or will become eligible within 180 days and UAI completed.   _28  Provider/Patient and/or support system has requested screening.  _29  UAI copy provided to patient or responsible party,  _30  UAI unavailable at discharge will send once processed to SNF provider.  _31  UAI unavailable at discharged mailed to patient  No:   _32  Private pay and is not financially eligible for Medicaid within the next 180 days.  _33  Reside out-of-state.  _34  A residents of a state owned/operated facility that is licensed  by Department of [place] and Developmental Services or [place]  _35  Enrollment in [place] hospice services  _36  Non [place] citizen  _37  Patient /Family declines to have screening completed or provide financial information for screening     Financial Resources:  Medicaid    _38  Initiated and application pending   <[id number]>_0<\/[id number]>_02  Full coverage     Advanced Care Plan:  _40 Surrogate Decision Maker of Care  _41 POA  _42 Communicated Code Status  "Full")    Other

## 2018-05-23 NOTE — Telephone Encounter (Signed)
Noted pre Dr. Lennart Pall

## 2018-05-23 NOTE — Progress Notes (Signed)
TOC Plan--Long Term Care    [facility] at [facility] noon today to [facility]. Called facility and made them aware. Called daughter, Suzette Battiest and informed her.    Vernice C.Stowers-Yerby  RN BSN CRM    Case Manager    [phone]    Transition of Care Plan to SNF/Rehab    SNF/Rehab Transition:  Patient has been accepted to [facility] and Rehab and meets criteria for admission.   [facility] will [facility] and expected to leave at 1200 NOON    Communication to Patient/Family:  Met with patient and spoke with daughter on the telephone and they are agreeable to the transition plan.    Communication to SNF/Rehab:  Bedside RN, Molly Maduro, has been notified to update the transition plan to the facility and call report (phone number [phone]  Discharge information has been updated on the AVS.     Discharge instructions to be fax'd to facility at [facility] # [phone]     SNF/Rehab Transition:  Patient to follow-up with Home Health: [facility],   PCP/Specialist: Dr. Myra Rude  Ambulatory Care Management:     Reviewed and confirmed with facility, nursing  they can manage the patient care needs for the following:     Loraine Leriche with (X) only those applicable:    Medication:  [x]   Medications will be available at the facility  []   IV Antibiotics   []   Controlled Substance - hard copy to be sent with patient   []   Weekly Labs   Documents:  [x]  Hard RX  [x]  MAR  [x]  Kardex  [x]  AVS  [x] Transfer Summary  [x] Discharge   Equipment:  []   CPAP/BiPAP  []   Wound Vacuum  []   Foley or Urinary Device  []   PICC/Central Line  []   Nebulizer  []   Ventilator   Treatment:  [] Isolation (for MRSA, VRE, etc.)  [] Surgical Drain Management  [] Tracheostomy Care  [] Dressing Changes  [] Dialysis with transportation and chair time   [] PEG Care  [] Oxygen  [] Daily Weights for Heart Failure   Dietary:  [] Any diet limitations  [] Tube Feedings   [] Total Parenteral Management (TPN)   Eligible for Medicaid Long Term  Services and Supports  Yes:  []  Eligible for medical assistance or will become eligible within 180 days and UAI completed.   []  Provider/Patient and/or support system has requested screening.  []  UAI copy provided to patient or responsible party,  [x]  UAI unavailable at discharge will send once processed to SNF provider.  []  UAI unavailable at discharged mailed to patient  No:   []  Private pay and is not financially eligible for Medicaid within the next 180 days.  []  Reside out-of-state.  []  A residents of a state owned/operated facility that is licensed  by Department of Behavioral Health and Developmental Services or [facility]  []  Enrollment in [place] hospice services  []  Non [place] citizen  []  Patient /Family declines to have screening completed or provide financial information for screening     Financial Resources:  Medicaid    []  Initiated and application pending   []  Full coverage     Advanced Care Plan:  [] Surrogate Decision Maker of Care  [] POA  [] Communicated Code Status  "Full")    Other

## 2018-05-23 NOTE — Progress Notes (Signed)
TOC Plan--Long term Care.    [facility] has received insurance authorization per [employer] and can accept patient today if medically stable. Called and made NP aware and patient will be discharging today. Called patient's daughter and made her aware and she is in agreement. [facility] and second medicare im letter explained to daughter with right to appeal and she agreement for [facility].  Copy of second medicare letter placed on chart and copy left in patient's room. FOC verbal consent signed and placed on chart.  PCS completed with medicals and given to nursing. [facility] and will await their response.  Nursing to call report to [phone].    Vernice C.Stowers-Yerby  RN BSN CRM    Case Manager    [phone]

## 2018-05-23 NOTE — Discharge Summary (Signed)
Discharge Summary by Toniann KetJames, Ermalinda Joubert Q, NP at 05/23/18 0715                Author: Toniann KetJames, Leighton Brickley Q, NP  Service: Internal Medicine  Author Type: Nurse Practitioner       Filed: 05/23/18 0941  Date of Service: 05/23/18 0715  Status: Addendum          Editor: Toniann KetJames, Aswad Wandrey Q, NP (Nurse Practitioner)       Related Notes: Original Note by Toniann KetJames, Cimone Fahey Q, NP (Nurse Practitioner) filed at 05/23/18 [phone]          Cosigner: Meghan ShowersHardi, Umar M, MD at 05/23/18 1019                                       Hospitalist Discharge Summary        Patient ID:   Meghan FuchsBarbara J [name]   [id number]   72 y.o.   Feb 10, 1946   05/18/2018      PCP on record: Gwynn BurlySeabrook, Berna A, NP      Admit date: 05/18/2018   Discharge date and time: 05/23/2018      DISCHARGE DIAGNOSIS:      Acute encephalopathy ??secondary to ??witnessed????seizure    S/P Fall   Mild renal insufficiency   HTN   Dementia    HLD   Depression       CONSULTATIONS:   IP CONSULT TO NEUROLOGY   IP CONSULT TO NEUROLOGY   IP CONSULT TO HOSPITALIST      Excerpted HPI from H&P of Meghan LenisEsteban A. Marquez, MD:      Meghan SchleinBarbara [name] is a 72 y.o. with past medical history as documented below presents to the ED with c/o of unwitnessed fall prior to arrival. ??Patient was found by patient's  sister on the floor trying to get up to use the bathroom. ??She denies hitting her head. ??denied neck pain no sings of trauma per er-   Patient in the er multiple times for multiple reasons.unsure on compliance with meds; records indicate that she takesDepakote/Topmax/vimpat for her seizure disorder. No reports fevers, headaches, blurry vision,headache, nausea, vomiting, abdominal  pain, CP, SOB, lightheadedness, dizziness, numbness, weakness, BLE swelling, heart palpitations, urinary sxs, diarrhea, constipation, melena, hematochezia, cough, or congestion.    While in the er pt had 2 witnessed seizures controlled with Ativan 2 mg ,pt now is post ictal.   No family in the room.        ______________________________________________________________________   DISCHARGE SUMMARY/HOSPITAL COURSE:   for full details see H&P, daily progress notes, labs, consult notes.          [name] was admitted on 05/18/18 after a witnessed fall. While in the ER she had 2 witnessed seizures. She was given Ativan 2 mg, which stopped seizures. She was admitted with seizures and encephalopathy. Encephalopathy attributed to recent seizure  activity. Head CT 05/18/18 IMPRESSION: No acute intracranial abnormality. EKG 10/5/19Normal sinus rhythm Normal ECG . Topiramate level 3.4 on admission.  VPA on 05/18/18 30, repeat level drawn on 05/20/18 was 97. Ammonia Level <10. Neuro consulted.  EEG results IMPRESSION: Abnormal for generalized slowing. No seizures noted. No further seizures noted during this hospital admission. She  has been clinically stable for discharge since 05/20/18. It was unclear if she had been taking her seizure meds PTA. Current meds for seizures prevention are VPA and  Vimpat, She has a history of  Dementia being managed with Aricept and Namenda. She  She  will be discharged to [facility] for LTC.          Acute encephalopathy ??secondary to ??witnessed????seizure, Improved    -Continue Vimpat and VPA   -F/U with neurology   -Continue seizure precautions         S/P Fall   -Fall Precautions continued   -Will be discharged to a LTC facilty         Mild renal insufficiency, improving    -Monitor renal function per LTC facility   -Stable at discharge   -Avoiding Nephrotoxins         HTN, stable   -Currently on no meds   -B/P stable      Hyperlipidemia, Stable   -Continue Tricor         Dementia    -Continue Aricept and Namenda   -Will be discharged to a LTC facilty         Depression   -Continue Celexa      _______________________________________________________________________   Patient seen and examined by me on discharge day.   Pertinent Findings:   Gen:    Not in distress   Chest: Clear lungs   CVS:    Regular rhythm.  No edema   Abd:  Soft, not distended, not tender   Neuro:  Alert, Oriented to self only   _______________________________________________________________________   DISCHARGE MEDICATIONS:      Current Discharge Medication List                     Patient Follow Up Instructions:    Activity: Activity as tolerated   Diet: Regular Diet   Wound Care: None needed      Follow-up with Facility Provider in 2 days. Also f/u with Neurology for seizure monitoring    Follow-up tests/labs per facility      Follow-up Information      None             ________________________________________________________________      Risk of deterioration: Low      Condition at Discharge:  Stable   __________________________________________________________________      Disposition   SNF/LTC      ____________________________________________________________________      Code Status: Full Code   ___________________________________________________________________         Total time in minutes spent coordinating this discharge (includes going over instructions, follow-up, prescriptions, and preparing report for sign off to her PCP) :  45 minutes      Signed:   Toniann Ket, NP

## 2018-05-24 NOTE — Progress Notes (Signed)
Patient discharged to a SNF [facility] facility, [facility]. Patient will be included in weekly care coordination calls.

## 2018-05-27 NOTE — Telephone Encounter (Signed)
Prior Auth DENIED for Pramipexole 4.5mg  by [employer]. Denial reason states:     -Patient must have "medically accepted indication" that is approved by FDA for use of Pramipexole. 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 Denial scanned into media for review.

## 2018-05-28 ENCOUNTER — Encounter: Attending: Nurse Practitioner | Primary: Internal Medicine

## 2018-05-28 NOTE — Telephone Encounter (Signed)
Will review per Dr. Aralu.

## 2018-05-30 MED FILL — ISOVUE-370  76 % INTRAVENOUS SOLUTION: 370 mg iodine /mL (76 %) | INTRAVENOUS | Qty: 100

## 2018-06-06 ENCOUNTER — Ambulatory Visit: Payer: Medicare Other | Admitting: Neurology

## 2018-06-07 MED ORDER — MEMANTINE 10 MG TAB
10 mg | ORAL_TABLET | ORAL | 0 refills | Status: AC
Start: 2018-06-07 — End: ?

## 2018-07-11 ENCOUNTER — Emergency Department: Admit: 2018-07-11 | Payer: MEDICARE | Primary: Internal Medicine

## 2018-07-11 ENCOUNTER — Inpatient Hospital Stay
Admit: 2018-07-11 | Discharge: 2018-07-15 | Disposition: A | Discharge status: Skilled Nursing Facility | Payer: MEDICARE | Attending: Internal Medicine | Admitting: Internal Medicine

## 2018-07-11 DIAGNOSIS — E11649 Type 2 diabetes mellitus with hypoglycemia without coma: Principal | ICD-10-CM

## 2018-07-11 LAB — METABOLIC PANEL, COMPREHENSIVE
A-G Ratio: 0.8 — ABNORMAL LOW (ref 1.1–2.2)
ALT (SGPT): 24 U/L (ref 12–78)
AST (SGOT): 55 U/L — ABNORMAL HIGH (ref 15–37)
Albumin: 3.2 g/dL — ABNORMAL LOW (ref 3.5–5.0)
Alk. phosphatase: 43 U/L — ABNORMAL LOW (ref 45–117)
Anion gap: 7 mmol/L (ref 5–15)
BUN/Creatinine ratio: 17 (ref 12–20)
BUN: 17 MG/DL (ref 6–20)
Bilirubin, total: 0.5 MG/DL (ref 0.2–1.0)
CO2: 24 mmol/L (ref 21–32)
Calcium: 9.2 MG/DL (ref 8.5–10.1)
Chloride: 110 mmol/L — ABNORMAL HIGH (ref 97–108)
Creatinine: 1.01 MG/DL (ref 0.55–1.02)
GFR est AA: >60 mL/min/{1.73_m2} (ref >60)
GFR est non-AA: 54 mL/min/{1.73_m2} — ABNORMAL LOW (ref >60)
Globulin: 4 g/dL (ref 2.0–4.0)
Glucose: 57 mg/dL — ABNORMAL LOW (ref 65–100)
Potassium: 4.8 mmol/L (ref 3.5–5.1)
Protein, total: 7.2 g/dL (ref 6.4–8.2)
Sodium: 141 mmol/L (ref 136–145)

## 2018-07-11 LAB — CBC WITH AUTOMATED DIFF
ABS. BASOPHILS: 0.1 10*3/uL (ref 0.0–0.1)
ABS. EOSINOPHILS: 0.1 10*3/uL (ref 0.0–0.4)
ABS. IMM. GRANS.: 0 10*3/uL (ref 0.00–0.04)
ABS. LYMPHOCYTES: 1.9 10*3/uL (ref 0.8–3.5)
ABS. MONOCYTES: 0.8 10*3/uL (ref 0.0–1.0)
ABS. NEUTROPHILS: 3.1 10*3/uL (ref 1.8–8.0)
ABSOLUTE NRBC: 0 10*3/uL (ref 0.00–0.01)
BASOPHILS: 1 % (ref 0–1)
EOSINOPHILS: 2 % (ref 0–7)
HCT: 43.1 % (ref 35.0–47.0)
HGB: 13.5 g/dL (ref 11.5–16.0)
IMMATURE GRANULOCYTES: 0 % (ref 0.0–0.5)
LYMPHOCYTES: 31 % (ref 12–49)
MCH: 30.7 PG (ref 26.0–34.0)
MCHC: 31.3 g/dL (ref 30.0–36.5)
MCV: 98 FL (ref 80.0–99.0)
MONOCYTES: 13 % (ref 5–13)
MPV: 10.9 FL (ref 8.9–12.9)
NEUTROPHILS: 53 % (ref 32–75)
NRBC: 0 PER 100 WBC
PLATELET: 524 10*3/uL — ABNORMAL HIGH (ref 150–400)
RBC: 4.4 M/uL (ref 3.80–5.20)
RDW: 14.3 % (ref 11.5–14.5)
WBC: 5.9 10*3/uL (ref 3.6–11.0)

## 2018-07-11 LAB — GLUCOSE, POC
Glucose (POC): 131 mg/dL — ABNORMAL HIGH (ref 65–100)
Glucose (POC): 62 mg/dL — ABNORMAL LOW (ref 65–100)
Glucose (POC): 70 mg/dL (ref 65–100)
Glucose (POC): 61 mg/dL — ABNORMAL LOW (ref 65–100)
Glucose (POC): 63 mg/dL — ABNORMAL LOW (ref 65–100)
Glucose (POC): 74 mg/dL (ref 65–100)
Glucose (POC): 65 mg/dL (ref 65–100)
Glucose (POC): 67 mg/dL (ref 65–100)
Glucose (POC): 67 mg/dL (ref 65–100)
Glucose (POC): 176 mg/dL — ABNORMAL HIGH (ref 65–100)

## 2018-07-11 LAB — HEMOGLOBIN A1C WITH EAG
Est. average glucose: 100 mg/dL
Hemoglobin A1c: 5.1 % (ref 4.0–5.6)

## 2018-07-11 LAB — URINALYSIS W/ REFLEX CULTURE
BACTERIA, URINE: NEGATIVE /hpf
Bacteria: NEGATIVE /hpf
Glucose, Ur: 250 mg/dL — AB
Glucose: 250 mg/dL — AB
Leukocyte Esterase, Urine: NEGATIVE
Leukocyte Esterase: NEGATIVE
Nitrite, Urine: NEGATIVE
Nitrites: NEGATIVE
Protein, UA: NEGATIVE mg/dL
Protein: NEGATIVE mg/dL
Specific Gravity, UA: 1.027 (ref 1.003–1.030)
Specific gravity: 1.027 (ref 1.003–1.030)
Urobilinogen, UA, POCT: 2 EU/dL — ABNORMAL HIGH (ref 0.2–1.0)
Urobilinogen: 2 EU/dL — ABNORMAL HIGH (ref 0.2–1.0)
pH (UA): 7.5 (ref 5.0–8.0)
pH, UA: 7.5 (ref 5.0–8.0)

## 2018-07-11 LAB — VALPROIC ACID
Valproic Acid: 96 ug/ml (ref 50–100)
Valproic acid: 96 ug/ml (ref 50–100)

## 2018-07-11 LAB — TROPONIN I: Troponin-I, Qt.: <0.05 ng/mL (ref <0.05)

## 2018-07-11 LAB — POC LACTIC ACID: Lactic Acid (POC): 1.77 mmol/L (ref 0.40–2.00)

## 2018-07-11 LAB — BILIRUBIN, CONFIRM: Bilirubin UA, confirm: NEGATIVE

## 2018-07-11 LAB — POCT GLUCOSE
POC Glucose: 131 mg/dL — ABNORMAL HIGH (ref 65–100)
POC Glucose: 62 mg/dL — ABNORMAL LOW (ref 65–100)
POC Glucose: 70 mg/dL (ref 65–100)
POC Glucose: 61 mg/dL — ABNORMAL LOW (ref 65–100)
POC Glucose: 63 mg/dL — ABNORMAL LOW (ref 65–100)
POC Glucose: 74 mg/dL (ref 65–100)
POC Glucose: 65 mg/dL (ref 65–100)
POC Glucose: 67 mg/dL (ref 65–100)
POC Glucose: 67 mg/dL (ref 65–100)
POC Glucose: 176 mg/dL — ABNORMAL HIGH (ref 65–100)

## 2018-07-11 LAB — COMPREHENSIVE METABOLIC PANEL
ALT: 24 U/L (ref 12–78)
AST: 55 U/L — ABNORMAL HIGH (ref 15–37)
Albumin/Globulin Ratio: 0.8 — ABNORMAL LOW (ref 1.1–2.2)
Albumin: 3.2 g/dL — ABNORMAL LOW (ref 3.5–5.0)
Alkaline Phosphatase: 43 U/L — ABNORMAL LOW (ref 45–117)
Anion Gap: 7 mmol/L (ref 5–15)
BUN: 17 MG/DL (ref 6–20)
Bun/Cre Ratio: 17 (ref 12–20)
CO2: 24 mmol/L (ref 21–32)
Calcium: 9.2 MG/DL (ref 8.5–10.1)
Chloride: 110 mmol/L — ABNORMAL HIGH (ref 97–108)
Creatinine: 1.01 MG/DL (ref 0.55–1.02)
EGFR IF NonAfrican American: 54 mL/min/{1.73_m2} — ABNORMAL LOW (ref >60)
GFR African American: >60 mL/min/{1.73_m2} (ref >60)
Globulin: 4 g/dL (ref 2.0–4.0)
Glucose: 57 mg/dL — ABNORMAL LOW (ref 65–100)
Potassium: 4.8 mmol/L (ref 3.5–5.1)
Sodium: 141 mmol/L (ref 136–145)
Total Bilirubin: 0.5 MG/DL (ref 0.2–1.0)
Total Protein: 7.2 g/dL (ref 6.4–8.2)

## 2018-07-11 LAB — CBC WITH AUTO DIFFERENTIAL
Basophils %: 1 % (ref 0–1)
Basophils Absolute: 0.1 10*3/uL (ref 0.0–0.1)
Eosinophils %: 2 % (ref 0–7)
Eosinophils Absolute: 0.1 10*3/uL (ref 0.0–0.4)
Granulocyte Absolute Count: 0 10*3/uL (ref 0.00–0.04)
Hematocrit: 43.1 % (ref 35.0–47.0)
Hemoglobin: 13.5 g/dL (ref 11.5–16.0)
Immature Granulocytes: 0 % (ref 0.0–0.5)
Lymphocytes %: 31 % (ref 12–49)
Lymphocytes Absolute: 1.9 10*3/uL (ref 0.8–3.5)
MCH: 30.7 PG (ref 26.0–34.0)
MCHC: 31.3 g/dL (ref 30.0–36.5)
MCV: 98 FL (ref 80.0–99.0)
MPV: 10.9 FL (ref 8.9–12.9)
Monocytes %: 13 % (ref 5–13)
Monocytes Absolute: 0.8 10*3/uL (ref 0.0–1.0)
NRBC Absolute: 0 10*3/uL (ref 0.00–0.01)
Neutrophils %: 53 % (ref 32–75)
Neutrophils Absolute: 3.1 10*3/uL (ref 1.8–8.0)
Nucleated RBCs: 0 PER 100 WBC
Platelets: 524 10*3/uL — ABNORMAL HIGH (ref 150–400)
RBC: 4.4 M/uL (ref 3.80–5.20)
RDW: 14.3 % (ref 11.5–14.5)
WBC: 5.9 10*3/uL (ref 3.6–11.0)

## 2018-07-11 LAB — HEMOGLOBIN A1C W/EAG
Hemoglobin A1C: 5.1 % (ref 4.0–5.6)
eAG: 100 mg/dL

## 2018-07-11 LAB — POCT LACTIC ACID: POC Lactic Acid: 1.77 mmol/L (ref 0.40–2.00)

## 2018-07-11 LAB — BILIRUBIN, CONFIRMATORY: Bilirubin Urine: NEGATIVE

## 2018-07-11 LAB — TROPONIN: Troponin I: <0.05 ng/mL (ref <0.05)

## 2018-07-11 MED ORDER — DEXTROSE 50% IN WATER (D50W) IV SYRG
INTRAVENOUS | Status: DC
Start: 2018-07-11 — End: 2018-07-11

## 2018-07-11 MED ORDER — DEXTROSE 5% IN NORMAL SALINE IV
INTRAVENOUS | Status: DC
Start: 2018-07-11 — End: 2018-07-14
  Administered 2018-07-11 – 2018-07-14 (×8): via INTRAVENOUS

## 2018-07-11 MED ORDER — DEXTROSE 50% IN WATER (D50W) IV
Freq: Once | INTRAVENOUS | Status: AC
Start: 2018-07-11 — End: 2018-07-12

## 2018-07-11 MED ORDER — ONDANSETRON (PF) 4 MG/2 ML INJECTION
4 mg/2 mL | INTRAMUSCULAR | Status: DC | PRN
Start: 2018-07-11 — End: 2018-07-15

## 2018-07-11 MED ORDER — ASPIRIN 81 MG CHEWABLE TAB
81 mg | Freq: Every day | ORAL | Status: DC
Start: 2018-07-11 — End: 2018-07-15
  Administered 2018-07-12 – 2018-07-15 (×4): via ORAL

## 2018-07-11 MED ORDER — FLUTICASONE 50 MCG/ACTUATION NASAL SPRAY, SUSP
50 mcg/actuation | Freq: Every day | NASAL | Status: DC
Start: 2018-07-11 — End: 2018-07-15
  Administered 2018-07-12 – 2018-07-15 (×4): via NASAL

## 2018-07-11 MED ORDER — TOPIRAMATE 100 MG TAB
100 mg | Freq: Every evening | ORAL | Status: DC
Start: 2018-07-11 — End: 2018-07-15
  Administered 2018-07-12 – 2018-07-15 (×4): via ORAL

## 2018-07-11 MED ORDER — SODIUM CHLORIDE 0.9% BOLUS IV
0.9 % | Freq: Once | INTRAVENOUS | Status: AC
Start: 2018-07-11 — End: 2018-07-11
  Administered 2018-07-11: 18:00:00 via INTRAVENOUS

## 2018-07-11 MED ORDER — CITALOPRAM 20 MG TAB
20 mg | Freq: Every day | ORAL | Status: DC
Start: 2018-07-11 — End: 2018-07-15
  Administered 2018-07-12 – 2018-07-15 (×4): via ORAL

## 2018-07-11 MED ORDER — DONEPEZIL 5 MG TAB
5 mg | Freq: Every evening | ORAL | Status: DC
Start: 2018-07-11 — End: 2018-07-15
  Administered 2018-07-12 – 2018-07-15 (×4): via ORAL

## 2018-07-11 MED ORDER — MEMANTINE 10 MG TAB
10 mg | Freq: Two times a day (BID) | ORAL | Status: DC
Start: 2018-07-11 — End: 2018-07-15
  Administered 2018-07-12 – 2018-07-15 (×8): via ORAL

## 2018-07-11 MED ORDER — ENOXAPARIN 40 MG/0.4 ML SUB-Q SYRINGE
40 mg/0.4 mL | SUBCUTANEOUS | Status: DC
Start: 2018-07-11 — End: 2018-07-15
  Administered 2018-07-12 – 2018-07-14 (×4): via SUBCUTANEOUS

## 2018-07-11 MED ORDER — PHARMACY INFORMATION NOTE
Freq: Once | Status: AC
Start: 2018-07-11 — End: 2018-07-11
  Administered 2018-07-11: 22:00:00

## 2018-07-11 MED ORDER — SODIUM CHLORIDE 0.9 % IJ SYRG
Freq: Three times a day (TID) | INTRAMUSCULAR | Status: DC
Start: 2018-07-11 — End: 2018-07-15
  Administered 2018-07-12 – 2018-07-15 (×13): via INTRAVENOUS

## 2018-07-11 MED ORDER — DEXTROSE 50% IN WATER (D50W) IV
INTRAVENOUS | Status: AC
Start: 2018-07-11 — End: 2018-07-11
  Administered 2018-07-11: 17:00:00 via INTRAVENOUS

## 2018-07-11 MED ORDER — DEXTROSE 50% IN WATER (D50W) IV
INTRAVENOUS | Status: AC
Start: 2018-07-11 — End: 2018-07-11

## 2018-07-11 MED ORDER — LORAZEPAM 2 MG/ML IJ SOLN
2 mg/mL | INTRAMUSCULAR | Status: DC | PRN
Start: 2018-07-11 — End: 2018-07-15

## 2018-07-11 MED ORDER — DIVALPROEX 500 MG TAB, DELAYED RELEASE
500 mg | Freq: Two times a day (BID) | ORAL | Status: DC
Start: 2018-07-11 — End: 2018-07-14
  Administered 2018-07-12: 02:00:00 via ORAL

## 2018-07-11 MED ORDER — HALOPERIDOL LACTATE 5 MG/ML IJ SOLN
5 mg/mL | Freq: Three times a day (TID) | INTRAMUSCULAR | Status: DC | PRN
Start: 2018-07-11 — End: 2018-07-15

## 2018-07-11 MED ORDER — SODIUM CHLORIDE 0.9 % IJ SYRG
INTRAMUSCULAR | Status: DC | PRN
Start: 2018-07-11 — End: 2018-07-15
  Administered 2018-07-13 – 2018-07-15 (×3): via INTRAVENOUS

## 2018-07-11 MED ORDER — DEXTROSE 50% IN WATER (D50W) IV
INTRAVENOUS | Status: AC
Start: 2018-07-11 — End: 2018-07-12

## 2018-07-11 MED ORDER — FENOFIBRATE NANOCRYSTALLIZED 48 MG TAB
48 mg | Freq: Every evening | ORAL | Status: DC
Start: 2018-07-11 — End: 2018-07-15
  Administered 2018-07-12 – 2018-07-15 (×4): via ORAL

## 2018-07-11 MED ORDER — LACOSAMIDE 100 MG TAB
100 mg | Freq: Two times a day (BID) | ORAL | Status: DC
Start: 2018-07-11 — End: 2018-07-14
  Administered 2018-07-12: 02:00:00 via ORAL

## 2018-07-11 MED FILL — SODIUM CHLORIDE 0.9 % IV: INTRAVENOUS | Qty: 1000

## 2018-07-11 MED FILL — DEXTROSE 5% IN NORMAL SALINE IV: INTRAVENOUS | Qty: 1000

## 2018-07-11 MED FILL — PHARMACY INFORMATION NOTE: Qty: 1

## 2018-07-11 MED FILL — DEXTROSE 50% IN WATER (D50W) IV: INTRAVENOUS | Qty: 50

## 2018-07-11 NOTE — ED Notes (Signed)
Dr. Catlet made aware of bs. Will continue to monitor.

## 2018-07-11 NOTE — Progress Notes (Signed)
Pharmacy  Dosing Consult    Pharmacy consulted to change PO meds to IV    Recent Labs     07/11/18  1212   CREA 1.01   BUN 17   HGB 13.5   PLT 524*   ALB 3.2*   SGOT 55*   ALT 24   TBILI 0.5     Temp (24hrs), Avg:98.4 ??F (36.9 ??C), Min:97.8 ??F (36.6 ??C), Max:99.3 ??F (37.4 ??C)    Impression/Plan:   No IV formulations available for citalopram, donepezil, fenofibrate, memantine or topiramate.   Divalproex DR 500 mg PO BID changed to valproate 250 mg IV every 6 hours.  Lacosamide 100 mg PO BID changed to lacosamide 100 mg IV every 12 hours.     Thanks for the consult,  Ellwood DenseLillie F Townsend, [facility] HospitalHARMD

## 2018-07-11 NOTE — ED Notes (Signed)
Dr. Catlet made aware of continue low blood sugar. Verbal order for dextrose 5% and 0.9% NSS at 125ml/hr and administered. Will continue to monitor.

## 2018-07-11 NOTE — ED Notes (Signed)
Dr. Catlet made aware of blood sugar. Order for Dextrose 5% and 0.9% NSS at 75ml/hr and administered.
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
## 2018-07-11 NOTE — Progress Notes (Signed)
Pharmacy Clarification of Prior to Admission Medication Regimen     The patient was not interviewed regarding clarification of the prior to admission medication regimen.     [facility], with a current med list.        Information Obtained From: Transfer Papers    Pertinent Pharmacy Findings:  Updated patient???s preferred outpatient pharmacy to: [facility] did not update the outpatient pharmacy due to the patient living at a SNF     PTA medication list was corrected to the following:     Prior to Admission Medications   Prescriptions Last Dose Informant Taking?   aspirin 81 mg chewable tablet 07/04/2018 at Unknown time Transfer Papers Yes   Sig: Take 81 mg by mouth daily.   cholecalciferol (VITAMIN D3) 1,000 unit cap 07/04/2018 at Unknown time Transfer Papers Yes   Sig: take 1 capsule by mouth once daily   citalopram (CELEXA) 20 mg tablet 07/04/2018 at Unknown time Transfer Papers Yes   Sig: Take 1 Tab by mouth daily.   divalproex DR (DEPAKOTE) 500 mg tablet 07/04/2018 at Unknown time Transfer Papers Yes   Sig: TAKE 1 TABLET BY MOUTH TWICE DAILY   donepezil (ARICEPT) 10 mg tablet 07/04/2018 at Unknown time Transfer Papers Yes   Sig: TAKE 1 TABLET BY MOUTH EVERY NIGHT   fenofibrate (LOFIBRA) 54 mg tablet 07/04/2018 at Unknown time Transfer Papers Yes   Sig: Take 1 Tab by mouth nightly.   fluticasone (FLONASE) 50 mcg/actuation nasal spray 07/04/2018 at Unknown time Transfer Papers Yes   Sig: 1 Spray by Both Nostrils route daily.   hydrALAZINE (APRESOLINE) 25 mg tablet 07/04/2018 at Unknown time Transfer Papers Yes   Sig: Take 1 Tab by mouth three (3) times daily as needed (systolic blood pressure greater than 170 or diastolic blood pressure greater than 110).   lacosamide (VIMPAT) 100 mg tab tablet 07/04/2018 at Unknown time Transfer Papers Yes   Sig: TAKE 1 TABLET BY MOUTH TWICE DAILY   memantine (NAMENDA) 10 mg tablet 07/04/2018 at Unknown time Transfer Papers Yes    Sig: TAKE 1 TABLET BY MOUTH TWICE DAILY   topiramate (TOPAMAX) 100 mg tablet 07/04/2018 at Unknown time Transfer Papers Yes   Sig: TAKE 1 TABLET BY MOUTH EVERY NIGHT      Facility-Administered Medications: None          Thank you,  Ronney LionJessica Lafferty,CPhT  Medication History Pharmacologistharmacy Technician
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
## 2018-07-11 NOTE — Progress Notes (Signed)
Problem: Seizure Disorder (Adult)  Goal: *STG: Remains free of seizure activity  Outcome: Progressing Towards Goal  Goal: *STG: Maintains lab values within therapeutic range  Outcome: Progressing Towards Goal  Goal: *STG/LTG: Complies with medication therapy  Outcome: Progressing Towards Goal  Goal: *STG: Remains free of injury during seizure activity  Outcome: Progressing Towards Goal  Goal: *STG: Remains safe in hospital  Outcome: Progressing Towards Goal  Goal: Interventions  Outcome: Progressing Towards Goal     Problem: Falls - Risk of  Goal: *Absence of Falls  Description  Document [name] Fall Risk and appropriate interventions in the flowsheet.  Outcome: Progressing Towards Goal  Note: Fall Risk Interventions:                                Problem: Patient Education: Go to Patient Education Activity  Goal: Patient/Family Education  Outcome: Progressing Towards Goal

## 2018-07-11 NOTE — ED Notes (Signed)
Dr. Catlet made aware of blood sugar. Per Dr. Catlet, continue to monitor blood sugar.

## 2018-07-11 NOTE — Progress Notes (Signed)
Admission data base not completed because pt. Is confuse and no family at bedside.
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
## 2018-07-11 NOTE — ED Notes (Signed)
Dr. Reatha Armour made aware of continue low blood sugar. Verbal order for dextrose 5% and 0.9% NSS at 162ml/hr and administered. Will continue to monitor.

## 2018-07-11 NOTE — Progress Notes (Signed)
 Pharmacy Clarification of Prior to Admission Medication Regimen     The patient was not interviewed regarding clarification of the prior to admission medication regimen.     Patient was transferred from [facility], to [facility], with a current med list.        Information Obtained From: Transfer Papers    Pertinent Pharmacy Findings:  Updated patient's preferred outpatient pharmacy to: [facility] did not update the outpatient pharmacy due to the patient living at a SNF     PTA medication list was corrected to the following:     Prior to Admission Medications   Prescriptions Last Dose Informant Taking?   aspirin  81 mg chewable tablet 07/04/2018 at Unknown time Transfer Papers Yes   Sig: Take 81 mg by mouth daily.   cholecalciferol (VITAMIN D3) 1,000 unit cap 07/04/2018 at Unknown time Transfer Papers Yes   Sig: take 1 capsule by mouth once daily   citalopram  (CELEXA ) 20 mg tablet 07/04/2018 at Unknown time Transfer Papers Yes   Sig: Take 1 Tab by mouth daily.   divalproex  DR (DEPAKOTE ) 500 mg tablet 07/04/2018 at Unknown time Transfer Papers Yes   Sig: TAKE 1 TABLET BY MOUTH TWICE DAILY   donepezil  (ARICEPT ) 10 mg tablet 07/04/2018 at Unknown time Transfer Papers Yes   Sig: TAKE 1 TABLET BY MOUTH EVERY NIGHT   fenofibrate (LOFIBRA) 54 mg tablet 07/04/2018 at Unknown time Transfer Papers Yes   Sig: Take 1 Tab by mouth nightly.   fluticasone (FLONASE) 50 mcg/actuation nasal spray 07/04/2018 at Unknown time Transfer Papers Yes   Sig: 1 Spray by Both Nostrils route daily.   hydrALAZINE  (APRESOLINE ) 25 mg tablet 07/04/2018 at Unknown time Transfer Papers Yes   Sig: Take 1 Tab by mouth three (3) times daily as needed (systolic blood pressure greater than 170 or diastolic blood pressure greater than 110).   lacosamide  (VIMPAT ) 100 mg tab tablet 07/04/2018 at Unknown time Transfer Papers Yes   Sig: TAKE 1 TABLET BY MOUTH TWICE DAILY   memantine  (NAMENDA ) 10 mg tablet 07/04/2018 at Unknown time Transfer Papers Yes   Sig: TAKE 1  TABLET BY MOUTH TWICE DAILY   topiramate (TOPAMAX) 100 mg tablet 07/04/2018 at Unknown time Transfer Papers Yes   Sig: TAKE 1 TABLET BY MOUTH EVERY NIGHT      Facility-Administered Medications: None          Thank you,  Harlene Deberah Sola  Medication History Pharmacologist

## 2018-07-11 NOTE — Progress Notes (Signed)
Pharmacy - Dosing Consult    Pharmacy consulted to change PO meds to IV    Recent Labs     07/11/18  1212   CREA 1.01   BUN 17   HGB 13.5   PLT 524*   ALB 3.2*   SGOT 55*   ALT 24   TBILI 0.5     Temp (24hrs), Avg:98.4 F (36.9 C), Min:97.8 F (36.6 C), Max:99.3 F (37.4 C)    Impression/Plan:   No IV formulations available for citalopram, donepezil, fenofibrate, memantine or topiramate.   Divalproex DR 500 mg PO BID changed to valproate 250 mg IV every 6 hours.  Lacosamide 100 mg PO BID changed to lacosamide 100 mg IV every 12 hours.     Thanks for the consult,  Ellwood Dense, [facility]

## 2018-07-11 NOTE — ED Notes (Signed)
Dr. Reatha Armouratlet made aware of bs. Will continue to monitor.

## 2018-07-11 NOTE — Progress Notes (Signed)
Problem: Seizure Disorder (Adult)  Goal: *STG: Remains free of seizure activity  Outcome: Progressing Towards Goal  Goal: *STG: Maintains lab values within therapeutic range  Outcome: Progressing Towards Goal  Goal: *STG/LTG: Complies with medication therapy  Outcome: Progressing Towards Goal  Goal: *STG: Remains free of injury during seizure activity  Outcome: Progressing Towards Goal  Goal: *STG: Remains safe in hospital  Outcome: Progressing Towards Goal  Goal: Interventions  Outcome: Progressing Towards Goal     Problem: Falls - Risk of  Goal: *Absence of Falls  Description  Document Bridgette HabermannSchmid Fall Risk and appropriate interventions in the flowsheet.  Outcome: Progressing Towards Goal  Note: Fall Risk Interventions:                                Problem: Patient Education: Go to Patient Education Activity  Goal: Patient/Family Education  Outcome: Progressing Towards Goal

## 2018-07-11 NOTE — ED Notes (Signed)
Dr. Reatha Armouratlet made aware of blood sugar. Order for Dextrose 5% and 0.9% NSS at 8375ml/hr and administered.
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
## 2018-07-11 NOTE — ED Notes (Signed)
Dr. Reatha Armour made aware of blood sugar. Per Dr. Reatha Armour, continue to monitor blood sugar.
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
## 2018-07-11 NOTE — Progress Notes (Signed)
Admission data base not completed because pt. Is confuse and no family at bedside.

## 2018-07-12 ENCOUNTER — Inpatient Hospital Stay: Admit: 2018-07-12 | Payer: MEDICARE | Primary: Internal Medicine

## 2018-07-12 LAB — VITAMIN B12 & FOLATE
Folate: 18 ng/mL (ref 5.0–21.0)
Folate: 18 ng/mL (ref 5.0–21.0)
Vitamin B-12: 1250 pg/mL — ABNORMAL HIGH (ref 193–986)
Vitamin B12: 1250 pg/mL — ABNORMAL HIGH (ref 193–986)

## 2018-07-12 LAB — EKG, 12 LEAD, INITIAL
Atrial Rate: 69 {beats}/min
Calculated P Axis: 73 degrees
Calculated R Axis: 32 degrees
Calculated T Axis: 61 degrees
Diagnosis: NORMAL
P-R Interval: 154 ms
Q-T Interval: 434 ms
QRS Duration: 84 ms
QTC Calculation (Bezet): 465 ms
Ventricular Rate: 69 {beats}/min

## 2018-07-12 LAB — GLUCOSE, POC
Glucose (POC): 98 mg/dL (ref 65–100)
Glucose (POC): 101 mg/dL — ABNORMAL HIGH (ref 65–100)
Glucose (POC): 114 mg/dL — ABNORMAL HIGH (ref 65–100)
Glucose (POC): 127 mg/dL — ABNORMAL HIGH (ref 65–100)

## 2018-07-12 LAB — AMMONIA
Ammonia, plasma: 16 umol/L (ref <32)
Ammonia: 16 umol/L (ref <32)

## 2018-07-12 LAB — EKG 12-LEAD
Atrial Rate: 69 {beats}/min
Diagnosis: NORMAL
P Axis: 73 degrees
P-R Interval: 154 ms
Q-T Interval: 434 ms
QRS Duration: 84 ms
QTc Calculation (Bazett): 465 ms
R Axis: 32 degrees
T Axis: 61 degrees
Ventricular Rate: 69 {beats}/min

## 2018-07-12 LAB — POCT GLUCOSE
POC Glucose: 98 mg/dL (ref 65–100)
POC Glucose: 101 mg/dL — ABNORMAL HIGH (ref 65–100)
POC Glucose: 114 mg/dL — ABNORMAL HIGH (ref 65–100)
POC Glucose: 127 mg/dL — ABNORMAL HIGH (ref 65–100)

## 2018-07-12 MED ORDER — LACOSAMIDE 200 MG/20 ML IV SOLN
200 mg/20 mL | Freq: Two times a day (BID) | INTRAVENOUS | Status: DC
Start: 2018-07-12 — End: 2018-07-14
  Administered 2018-07-12 – 2018-07-14 (×5): via INTRAVENOUS

## 2018-07-12 MED ORDER — VALPROATE SODIUM 100 MG/ML IV
5005100 mg/5 mL (100 mg/mL) | Freq: Four times a day (QID) | INTRAVENOUS | Status: DC
Start: 2018-07-12 — End: 2018-07-14
  Administered 2018-07-12 – 2018-07-14 (×10): via INTRAVENOUS

## 2018-07-12 MED ORDER — GADOTERATE MEGLUMINE 0.5 MMOL/ML IV SOLUTION
0.5 mmol/mL (376.9 mg/mL) | Freq: Once | INTRAVENOUS | Status: AC
Start: 2018-07-12 — End: 2018-07-12
  Administered 2018-07-12: 22:00:00 via INTRAVENOUS

## 2018-07-12 MED FILL — VALPROATE SODIUM 100 MG/ML IV: 500 mg/5 mL (100 mg/mL) | INTRAVENOUS | Qty: 2.5

## 2018-07-12 MED FILL — VIMPAT 200 MG/20 ML INTRAVENOUS SOLUTION: 200 mg/20 mL | INTRAVENOUS | Qty: 10

## 2018-07-12 MED FILL — FLUTICASONE 50 MCG/ACTUATION NASAL SPRAY, SUSP: 50 mcg/actuation | NASAL | Qty: 16

## 2018-07-12 MED FILL — TOPIRAMATE 100 MG TAB: 100 mg | ORAL | Qty: 1

## 2018-07-12 MED FILL — CITALOPRAM 20 MG TAB: 20 mg | ORAL | Qty: 1

## 2018-07-12 MED FILL — BD POSIFLUSH NORMAL SALINE 0.9 % INJECTION SYRINGE: INTRAMUSCULAR | Qty: 10

## 2018-07-12 MED FILL — CHILDREN'S ASPIRIN 81 MG CHEWABLE TABLET: 81 mg | ORAL | Qty: 1

## 2018-07-12 MED FILL — ENOXAPARIN 40 MG/0.4 ML SUB-Q SYRINGE: 40 mg/0.4 mL | SUBCUTANEOUS | Qty: 0.4

## 2018-07-12 MED FILL — VIMPAT 200 MG/20 ML INTRAVENOUS SOLUTION: 200 mg/20 mL | INTRAVENOUS | Qty: 20

## 2018-07-12 MED FILL — DONEPEZIL 5 MG TAB: 5 mg | ORAL | Qty: 2

## 2018-07-12 MED FILL — FENOFIBRATE NANOCRYSTALLIZED 48 MG TAB: 48 mg | ORAL | Qty: 1

## 2018-07-12 MED FILL — MEMANTINE 10 MG TAB: 10 mg | ORAL | Qty: 1

## 2018-07-12 MED FILL — VALPROATE SODIUM 100 MG/ML IV: 500 mg/5 mL (100 mg/mL) | INTRAVENOUS | Qty: 5

## 2018-07-12 MED FILL — NAMENDA 10 MG TABLET: 10 mg | ORAL | Qty: 1

## 2018-07-12 NOTE — Other (Signed)
MRI NOTE    MRI screening sheet needs to be completed and signed before the MRI can be performed.    Please call [phone]  When this is done
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
## 2018-07-12 NOTE — Other (Signed)
Waiting on family to do MRI screening sheet per Nurse
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
## 2018-07-12 NOTE — Procedures (Signed)
Patient Name: Meghan [name]  DOB: 05/08/1946  Age: 72 y.o.    Ordering physician: No ref. provider found  Date of EEG: 07/12/2018  EEG procedure number: [id number]  Diagnosis: alt mental status  Interpreting physician: Willadeen Colantuono, MD    ELECTROENCEPHALOGRAM REPORT     PROCEDURE: EEG.     CLINICAL INDICATION: The patient is a 72 y.o. female with a history of possible seizures. EEG to rule out seizures, rule out stroke, rule out cortical abnormality.     EEG CLASSIFICATION: Abnormal     DESCRIPTION OF THE RECORD:   The background of this recording contains a posteriorly-located occipital alpha rhythm of 5 Hz that attenuates with eye opening.     Throughout the recording, there were no clear areas of focal slowing nor spike or spike-and-wave discharges seen.     Hyperventilation was not performed. Photic stimulation produced no response.     During the recording the patient did not achieve stage II sleep    INTERPRETATION:   Abnormal. Moderate diffuse cerebral dysfunction which is non specific for etiology but can be seen in toxic/metabolic states. No seizures. Clinical correlation recommended.      Van Seymore, MD  Neurologist
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
## 2018-07-12 NOTE — Procedures (Signed)
Patient Name: Meghan FuchsBarbara J [name]  DOB: 11-29-1945  Age: 72 y.o.    Ordering physician: No ref. provider found  Date of EEG: 07/12/2018  EEG procedure number: [id number]  Diagnosis: alt mental status  Interpreting physician: Doroteo BradfordSiddhartha Zamarian Scarano, MD    ELECTROENCEPHALOGRAM REPORT     PROCEDURE: EEG.     CLINICAL INDICATION: The patient is a 72 y.o. female with a history of possible seizures. EEG to rule out seizures, rule out stroke, rule out cortical abnormality.     EEG CLASSIFICATION: Abnormal     DESCRIPTION OF THE RECORD:   The background of this recording contains a posteriorly-located occipital alpha rhythm of 5 Hz that attenuates with eye opening.     Throughout the recording, there were no clear areas of focal slowing nor spike or spike-and-wave discharges seen.     Hyperventilation was not performed. Photic stimulation produced no response.     During the recording the patient did not achieve stage II sleep    INTERPRETATION:   Abnormal. Moderate diffuse cerebral dysfunction which is non specific for etiology but can be seen in toxic/metabolic states. No seizures. Clinical correlation recommended.      Doroteo BradfordSiddhartha Anuja Manka, MD  Neurologist
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
## 2018-07-13 LAB — GLUCOSE, POC
Glucose (POC): 131 mg/dL — ABNORMAL HIGH (ref 65–100)
Glucose (POC): 80 mg/dL (ref 65–100)
Glucose (POC): 70 mg/dL (ref 65–100)
Glucose (POC): 78 mg/dL (ref 65–100)
Glucose (POC): 92 mg/dL (ref 65–100)
Glucose (POC): 88 mg/dL (ref 65–100)

## 2018-07-13 LAB — INSULIN
Insulin: 11.7 u[IU]/mL (ref 2.6–24.9)
Insulin: 11.7 u[IU]/mL (ref 2.6–24.9)

## 2018-07-13 LAB — C-PEPTIDE
C-Peptide: 3.1 ng/mL (ref 1.1–4.4)
C-Peptide: 3.1 ng/mL (ref 1.1–4.4)

## 2018-07-13 LAB — POCT GLUCOSE
POC Glucose: 131 mg/dL — ABNORMAL HIGH (ref 65–100)
POC Glucose: 80 mg/dL (ref 65–100)
POC Glucose: 70 mg/dL (ref 65–100)
POC Glucose: 78 mg/dL (ref 65–100)
POC Glucose: 92 mg/dL (ref 65–100)
POC Glucose: 88 mg/dL (ref 65–100)

## 2018-07-13 MED FILL — BD POSIFLUSH NORMAL SALINE 0.9 % INJECTION SYRINGE: INTRAMUSCULAR | Qty: 10

## 2018-07-13 MED FILL — VALPROATE SODIUM 100 MG/ML IV: 500 mg/5 mL (100 mg/mL) | INTRAVENOUS | Qty: 2.5

## 2018-07-13 MED FILL — NAMENDA 10 MG TABLET: 10 mg | ORAL | Qty: 1

## 2018-07-13 MED FILL — ARICEPT 5 MG TABLET: 5 mg | ORAL | Qty: 2

## 2018-07-13 MED FILL — ENOXAPARIN 40 MG/0.4 ML SUB-Q SYRINGE: 40 mg/0.4 mL | SUBCUTANEOUS | Qty: 0.4

## 2018-07-13 MED FILL — TRICOR 48 MG TABLET: 48 mg | ORAL | Qty: 1

## 2018-07-13 MED FILL — TOPIRAMATE 100 MG TAB: 100 mg | ORAL | Qty: 1

## 2018-07-13 MED FILL — CITALOPRAM 20 MG TAB: 20 mg | ORAL | Qty: 1

## 2018-07-13 MED FILL — CHILDREN'S ASPIRIN 81 MG CHEWABLE TABLET: 81 mg | ORAL | Qty: 1

## 2018-07-13 MED FILL — VIMPAT 200 MG/20 ML INTRAVENOUS SOLUTION: 200 mg/20 mL | INTRAVENOUS | Qty: 20

## 2018-07-13 MED FILL — BD POSIFLUSH NORMAL SALINE 0.9 % INJECTION SYRINGE: INTRAMUSCULAR | Qty: 50

## 2018-07-13 MED FILL — VALPROATE SODIUM 100 MG/ML IV: 500 mg/5 mL (100 mg/mL) | INTRAVENOUS | Qty: 5
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
## 2018-07-13 NOTE — Progress Notes (Signed)
HYPOGLYCEMIC EPISODE DOCUMENTATION    Patient with hypoglycemic episode(s) at 2126(time) on 11/30(date).    BG value(s) pre-treatment 79    Was patient symptomatic? [] yes, [x] no  Patient was treated with the following rescue medications/treatments: [] D50                [] Glucose tablets                [] Glucagon                [x] 4oz juice                [] 6oz reg soda                [] 8oz low fat milk  2139BG value post-treatment:70  2206 recheck:89        Once BG treated and value greater than 80mg/dl, pt was provided with the following:  [x] snack  [] meal  Name of MD notified:Dr Bazzi  The following orders were received: increase rate of D5NS to 125/hour, added hypoglycemia protocol treatment to MAR

## 2018-07-13 NOTE — Progress Notes (Signed)
HYPOGLYCEMIC EPISODE DOCUMENTATION    Patient with hypoglycemic episode(s) at 1104(time) on 07/13/18  (date).    BG value(s) pre-treatment 70    Was patient symptomatic? [] yes, [x] no  Patient was treated with the following rescue medications/treatments: [] D50                [] Glucose tablets                [] Glucagon                [x] 4oz juice                [] 6oz reg soda                [] 8oz low fat milk  BG value post-treatment: 78/ 4oz juice given. 92

## 2018-07-13 NOTE — Progress Notes (Signed)
Problem: Seizure Disorder (Adult)  Goal: *STG: Remains free of seizure activity  Outcome: Progressing Towards Goal  Goal: *STG: Maintains lab values within therapeutic range  Outcome: Progressing Towards Goal  Goal: *STG/LTG: Complies with medication therapy  Outcome: Progressing Towards Goal  Goal: *STG: Remains free of injury during seizure activity  Outcome: Progressing Towards Goal  Goal: *STG: Remains safe in hospital  Outcome: Progressing Towards Goal  Goal: Interventions  Outcome: Progressing Towards Goal     Problem: Falls - Risk of  Goal: *Absence of Falls  Description  Document [name] Fall Risk and appropriate interventions in the flowsheet.  Outcome: Progressing Towards Goal  Note: Fall Risk Interventions:  Mobility Interventions: Assess mobility with egress test, Bed/chair exit alarm    Mentation Interventions: Bed/chair exit alarm    Medication Interventions: Bed/chair exit alarm, Evaluate medications/consider consulting pharmacy, Patient to call before getting OOB    Elimination Interventions: Bed/chair exit alarm              Problem: Patient Education: Go to Patient Education Activity  Goal: Patient/Family Education  Outcome: Progressing Towards Goal     Problem: Patient Education: Go to Patient Education Activity  Goal: Patient/Family Education  Outcome: Progressing Towards Goal     Problem: Patient Education: Go to Patient Education Activity  Goal: Patient/Family Education  Outcome: Progressing Towards Goal
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
## 2018-07-13 NOTE — Progress Notes (Signed)
**  Consult Information**  Member Facility: [facility]  Facility MRN: [id number]  Consult ID: [id number]  Facility Time Zone: ET  Date and Time of Consult: 07/13/2018 10:36:07 PM  Requesting Clinician: Jared Eskins  Patient Name: Meghan [name] , Meghan [name]  Date of Birth: 12/12/1945  Gender: Female    **Clinical Note**  Clinical Note: Patient's HS blood sugar 79. treated with juice- rechecked 70. Retreated with juice, rechecked: 89. Patient is on D5NS at 75    Patient has normal C-peptide and insulin level.  Increase D5 normal saline to 125 oh per hour.  Start on hypoglycemia.  Check BMP stat as it was not checked this morning    **Attestation**  Interaction Mode: Electronic Interaction  Interaction Attestation: Interprofessional internet consultation was delivered through telephonic and/ or electronic communication upon the request of the patient???s treating physician, while the requesting and the rendering provider were not in the same physical location. Written report was provided to the requesting provider.  Evaluation Duration (mins): 2

## 2018-07-13 NOTE — Progress Notes (Signed)
Reason for Admission:  Acute Encephalotpathy                RRAT Score: 50                 Resources/supports as identified by patient/family: Pt has the support of her daughter and her sister, both live nearby.                   Top Challenges facing patient (as identified by patient/family and CM):                       Finances/Medication cost?  Pt has medicaid and is a LTC resident at a SNF. She has no issues obtaining medications.                  Transportation?  Pt is a LTC resident but transport if needed is provided by the patients daughter.               Support system or lack thereof?  Pt has a supportive daughter, [name], and a sister that assists with her care planning.                      Living arrangements?  Pt is a [facility] at [facility] and has lived there since October 2019.               Self-care/ADLs/Cognition?  Pt requires assistance with all ADLs. And requires 24/7 supervision.           Current Advanced Directive/Advance Care Plan: Pt is currently a full code. An ACP is not on file. Pt has mild cognitive impairment and a memory disorder which prevents the completion of an ACP. Pt daughter [name] is her NOK.                            Plan for utilizing home health:  [facility].                     Transition of Care Plan:    CM contacted pt daughter [name] to discuss d/c planning. CM verified pt demographic, insurance, and PCP information. [facility]. Plan is for pt to return to SNF at discharge. Pt daughter indicates that she is able to transport her at discharge.         CM will continue to follow patient for discharge planning needs and arrange for services as deemed necessary.  Care Management Interventions  PCP Verified by CM: Yes  Mode of Transport at Discharge: Other (see comment)  Transition of Care Consult (CM Consult): Discharge Planning   Current Support Network: Nursing Facility([facility])  Confirm Follow Up Transport: Family  Discharge Location  Discharge Placement: Long Term Care      Estelle JuneJamila Goodall, Care Manager  [phone]

## 2018-07-13 NOTE — Progress Notes (Signed)
Problem: Seizure Disorder (Adult)  Goal: *STG: Remains free of seizure activity  Outcome: Progressing Towards Goal  Goal: *STG: Maintains lab values within therapeutic range  Outcome: Progressing Towards Goal  Goal: *STG/LTG: Complies with medication therapy  Outcome: Progressing Towards Goal  Goal: *STG: Remains free of injury during seizure activity  Outcome: Progressing Towards Goal  Goal: *STG: Remains safe in hospital  Outcome: Progressing Towards Goal  Goal: Interventions  Outcome: Progressing Towards Goal     Problem: Falls - Risk of  Goal: *Absence of Falls  Description  Document [name] Fall Risk and appropriate interventions in the flowsheet.  Outcome: Progressing Towards Goal  Note: Fall Risk Interventions:  Mobility Interventions: Bed/chair exit alarm    Mentation Interventions: Bed/chair exit alarm    Medication Interventions: Bed/chair exit alarm, Patient to call before getting OOB    Elimination Interventions: Bed/chair exit alarm              Problem: Patient Education: Go to Patient Education Activity  Goal: Patient/Family Education  Outcome: Progressing Towards Goal     Problem: Patient Education: Go to Patient Education Activity  Goal: Patient/Family Education  Outcome: Progressing Towards Goal     Problem: Pressure Injury - Risk of  Goal: *Prevention of pressure injury  Description  Document Braden Scale and appropriate interventions in the flowsheet.  Outcome: Progressing Towards Goal  Note: Pressure Injury Interventions:  Sensory Interventions: Assess changes in LOC    Moisture Interventions: Absorbent underpads    Activity Interventions: Pressure redistribution bed/mattress(bed type)    Mobility Interventions: Pressure redistribution bed/mattress (bed type)    Nutrition Interventions: Document food/fluid/supplement intake                     Problem: Patient Education: Go to Patient Education Activity  Goal: Patient/Family Education  Outcome: Progressing Towards Goal

## 2018-07-13 NOTE — Progress Notes (Signed)
HYPOGLYCEMIC EPISODE DOCUMENTATION    Patient with hypoglycemic episode(s) at 2126(time) on 11/30(date).    BG value(s) pre-treatment 79    Was patient symptomatic? []  yes, [x]  no  Patient was treated with the following rescue medications/treatments: []  D50                []  Glucose tablets                []  Glucagon                [x]  4oz juice                []  6oz reg soda                []  8oz low fat milk  2139BG value post-treatment:70  2206 recheck:89        Once BG treated and value greater than 80mg /dl, pt was provided with the following:  [x]  snack  []  meal  Name of MD notified:Dr Asencion GowdaBazzi  The following orders were received: increase rate of D5NS to 125/hour, [facility]

## 2018-07-13 NOTE — Progress Notes (Signed)
HYPOGLYCEMIC EPISODE DOCUMENTATION    Patient with hypoglycemic episode(s) at 1104(time) on 07/13/18  (date).    BG value(s) pre-treatment 70    Was patient symptomatic? []  yes, [x]  no  Patient was treated with the following rescue medications/treatments: []  D50                []  Glucose tablets                []  Glucagon                [x]  4oz juice                []  6oz reg soda                []  8oz low fat milk  BG value post-treatment: 78/ 4oz juice given. 92
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
## 2018-07-13 NOTE — Progress Notes (Signed)
Reason for Admission:  Acute Encephalotpathy                RRAT Score: 50                 Resources/supports as identified by patient/family: Pt has the support of her daughter and her sister, both live nearby.                   Top Challenges facing patient (as identified by patient/family and CM):                       Finances/Medication cost?  Pt has medicaid and is a LTC resident at a SNF. She has no issues obtaining medications.                  Transportation?  Pt is a LTC resident but transport if needed is provided by the patients daughter.               Support system or lack thereof?  Pt has a supportive daughter, [name], and a sister that assists with her care planning.                      Living arrangements?  [facility] is a [facility] and has lived there since October 2019.               Self-care/ADLs/Cognition?  Pt requires assistance with all ADLs. And requires 24/7 supervision.           Current Advanced Directive/Advance Care Plan: Pt is currently a full code. An ACP is not on file. Pt has mild cognitive impairment and a memory disorder which prevents the completion of an ACP. Pt daughter [name] is her NOK.                            Plan for utilizing home health:  Pt is a [facility] at [facility].                     Transition of Care Plan:    CM contacted pt daughter [name] to discuss d/c planning. CM verified pt demographic, insurance, and PCP information. [facility]. Plan is for pt to return to SNF at discharge. Pt daughter indicates that she is able to transport her at discharge.         CM will continue to follow patient for discharge planning needs and arrange for services as deemed necessary.  Care Management Interventions  PCP Verified by CM: Yes  Mode of Transport at Discharge: Other (see comment)  Transition of Care Consult (CM Consult): Discharge Planning  Current Support Network: Nursing  Facility([facility])  Confirm Follow Up Transport: Family  Discharge Location  Discharge Placement: Long Term Care      Estelle JuneJamila Goodall, Care Manager  [phone]

## 2018-07-13 NOTE — Progress Notes (Signed)
Problem: Seizure Disorder (Adult)  Goal: *STG: Remains free of seizure activity  Outcome: Progressing Towards Goal  Goal: *STG: Maintains lab values within therapeutic range  Outcome: Progressing Towards Goal  Goal: *STG/LTG: Complies with medication therapy  Outcome: Progressing Towards Goal  Goal: *STG: Remains free of injury during seizure activity  Outcome: Progressing Towards Goal  Goal: *STG: Remains safe in hospital  Outcome: Progressing Towards Goal  Goal: Interventions  Outcome: Progressing Towards Goal     Problem: Falls - Risk of  Goal: *Absence of Falls  Description  Document Meghan [name] Fall Risk and appropriate interventions in the flowsheet.  Outcome: Progressing Towards Goal  Note: Fall Risk Interventions:  Mobility Interventions: Assess mobility with egress test, Bed/chair exit alarm    Mentation Interventions: Bed/chair exit alarm    Medication Interventions: Bed/chair exit alarm, Evaluate medications/consider consulting pharmacy, Patient to call before getting OOB    Elimination Interventions: Bed/chair exit alarm              Problem: Patient Education: Go to Patient Education Activity  Goal: Patient/Family Education  Outcome: Progressing Towards Goal     Problem: Patient Education: Go to Patient Education Activity  Goal: Patient/Family Education  Outcome: Progressing Towards Goal     Problem: Patient Education: Go to Patient Education Activity  Goal: Patient/Family Education  Outcome: Progressing Towards Goal
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
## 2018-07-13 NOTE — Progress Notes (Signed)
**  Consult Information**  Member Facility[facility]  Facility MRN: [id number]  Consult ID: [id number]  Facility Time Zone: ET  Date and Time of Consult: 07/13/2018 10:36:07 PM  Requesting Clinician: Lonna DuvalJared Eskins  Patient Name: Meghan [name] , Meghan [name]  Date of Birth: [birth year]  Gender: Female    **Clinical Note**  Clinical Note: Patient's HS blood sugar 79. treated with juice- rechecked 70. Retreated with juice, rechecked: 89. Patient is on D5NS at 2375    Patient has normal C-peptide and insulin level.  Increase D5 normal saline to 125 oh per hour.  Start on hypoglycemia.  Check BMP stat as it was not checked this morning    **Attestation**  Interaction Mode: Electronic Interaction  Interaction Attestation: Interprofessional internet consultation was delivered through telephonic and/ or electronic communication upon the request of the patient???s treating physician, while the requesting and the rendering provider were not in the same physical location. Written report was provided to the requesting provider.  Evaluation Duration (mins): 2

## 2018-07-13 NOTE — Progress Notes (Signed)
Problem: Seizure Disorder (Adult)  Goal: *STG: Remains free of seizure activity  Outcome: Progressing Towards Goal  Goal: *STG: Maintains lab values within therapeutic range  Outcome: Progressing Towards Goal  Goal: *STG/LTG: Complies with medication therapy  Outcome: Progressing Towards Goal  Goal: *STG: Remains free of injury during seizure activity  Outcome: Progressing Towards Goal  Goal: *STG: Remains safe in hospital  Outcome: Progressing Towards Goal  Goal: Interventions  Outcome: Progressing Towards Goal     Problem: Falls - Risk of  Goal: *Absence of Falls  Description  Document Bridgette HabermannSchmid Fall Risk and appropriate interventions in the flowsheet.  Outcome: Progressing Towards Goal  Note: Fall Risk Interventions:  Mobility Interventions: Bed/chair exit alarm    Mentation Interventions: Bed/chair exit alarm    Medication Interventions: Bed/chair exit alarm, Patient to call before getting OOB    Elimination Interventions: Bed/chair exit alarm              Problem: Patient Education: Go to Patient Education Activity  Goal: Patient/Family Education  Outcome: Progressing Towards Goal     Problem: Patient Education: Go to Patient Education Activity  Goal: Patient/Family Education  Outcome: Progressing Towards Goal     Problem: Pressure Injury - Risk of  Goal: *Prevention of pressure injury  Description  Document Braden Scale and appropriate interventions in the flowsheet.  Outcome: Progressing Towards Goal  Note: Pressure Injury Interventions:  Sensory Interventions: Assess changes in LOC    Moisture Interventions: Absorbent underpads    Activity Interventions: Pressure redistribution bed/mattress(bed type)    Mobility Interventions: Pressure redistribution bed/mattress (bed type)    Nutrition Interventions: Document food/fluid/supplement intake                     Problem: Patient Education: Go to Patient Education Activity  Goal: Patient/Family Education  Outcome: Progressing Towards Goal

## 2018-07-14 LAB — CBC WITH AUTOMATED DIFF
ABS. BASOPHILS: 0.1 10*3/uL (ref 0.0–0.1)
ABS. EOSINOPHILS: 0.2 10*3/uL (ref 0.0–0.4)
ABS. IMM. GRANS.: 0 10*3/uL (ref 0.00–0.04)
ABS. LYMPHOCYTES: 2.4 10*3/uL (ref 0.8–3.5)
ABS. MONOCYTES: 0.8 10*3/uL (ref 0.0–1.0)
ABS. NEUTROPHILS: 2.3 10*3/uL (ref 1.8–8.0)
ABSOLUTE NRBC: 0 10*3/uL (ref 0.00–0.01)
BASOPHILS: 1 % (ref 0–1)
EOSINOPHILS: 4 % (ref 0–7)
HCT: 39 % (ref 35.0–47.0)
HGB: 12.6 g/dL (ref 11.5–16.0)
IMMATURE GRANULOCYTES: 0 % (ref 0.0–0.5)
LYMPHOCYTES: 41 % (ref 12–49)
MCH: 30.9 PG (ref 26.0–34.0)
MCHC: 32.3 g/dL (ref 30.0–36.5)
MCV: 95.6 FL (ref 80.0–99.0)
MONOCYTES: 14 % — ABNORMAL HIGH (ref 5–13)
MPV: 10.4 FL (ref 8.9–12.9)
NEUTROPHILS: 40 % (ref 32–75)
NRBC: 0 PER 100 WBC
PLATELET: 545 10*3/uL — ABNORMAL HIGH (ref 150–400)
RBC: 4.08 M/uL (ref 3.80–5.20)
RDW: 13.8 % (ref 11.5–14.5)
WBC: 5.9 10*3/uL (ref 3.6–11.0)

## 2018-07-14 LAB — METABOLIC PANEL, COMPREHENSIVE
A-G Ratio: 0.7 — ABNORMAL LOW (ref 1.1–2.2)
ALT (SGPT): 18 U/L (ref 12–78)
AST (SGOT): 23 U/L (ref 15–37)
Albumin: 2.9 g/dL — ABNORMAL LOW (ref 3.5–5.0)
Alk. phosphatase: 46 U/L (ref 45–117)
Anion gap: 10 mmol/L (ref 5–15)
BUN/Creatinine ratio: 6 — ABNORMAL LOW (ref 12–20)
BUN: 5 MG/DL — ABNORMAL LOW (ref 6–20)
Bilirubin, total: 0.6 MG/DL (ref 0.2–1.0)
CO2: 24 mmol/L (ref 21–32)
Calcium: 8.9 MG/DL (ref 8.5–10.1)
Chloride: 111 mmol/L — ABNORMAL HIGH (ref 97–108)
Creatinine: 0.83 MG/DL (ref 0.55–1.02)
GFR est AA: >60 mL/min/{1.73_m2} (ref >60)
GFR est non-AA: >60 mL/min/{1.73_m2} (ref >60)
Globulin: 3.9 g/dL (ref 2.0–4.0)
Glucose: 94 mg/dL (ref 65–100)
Potassium: 3.2 mmol/L — ABNORMAL LOW (ref 3.5–5.1)
Protein, total: 6.8 g/dL (ref 6.4–8.2)
Sodium: 145 mmol/L (ref 136–145)

## 2018-07-14 LAB — VALPROIC ACID
Valproic Acid: 93 ug/ml (ref 50–100)
Valproic acid: 93 ug/ml (ref 50–100)

## 2018-07-14 LAB — MAGNESIUM
Magnesium: 1.8 mg/dL (ref 1.6–2.4)
Magnesium: 1.8 mg/dL (ref 1.6–2.4)

## 2018-07-14 LAB — GLUCOSE, POC
Glucose (POC): 79 mg/dL (ref 65–100)
Glucose (POC): 70 mg/dL (ref 65–100)
Glucose (POC): 89 mg/dL (ref 65–100)
Glucose (POC): 85 mg/dL (ref 65–100)
Glucose (POC): 86 mg/dL (ref 65–100)
Glucose (POC): 89 mg/dL (ref 65–100)

## 2018-07-14 LAB — PHOSPHORUS
Phosphorus: 2.1 MG/DL — ABNORMAL LOW (ref 2.6–4.7)
Phosphorus: 2.1 MG/DL — ABNORMAL LOW (ref 2.6–4.7)

## 2018-07-14 LAB — CBC WITH AUTO DIFFERENTIAL
Basophils %: 1 % (ref 0–1)
Basophils Absolute: 0.1 10*3/uL (ref 0.0–0.1)
Eosinophils %: 4 % (ref 0–7)
Eosinophils Absolute: 0.2 10*3/uL (ref 0.0–0.4)
Granulocyte Absolute Count: 0 10*3/uL (ref 0.00–0.04)
Hematocrit: 39 % (ref 35.0–47.0)
Hemoglobin: 12.6 g/dL (ref 11.5–16.0)
Immature Granulocytes: 0 % (ref 0.0–0.5)
Lymphocytes %: 41 % (ref 12–49)
Lymphocytes Absolute: 2.4 10*3/uL (ref 0.8–3.5)
MCH: 30.9 PG (ref 26.0–34.0)
MCHC: 32.3 g/dL (ref 30.0–36.5)
MCV: 95.6 FL (ref 80.0–99.0)
MPV: 10.4 FL (ref 8.9–12.9)
Monocytes %: 14 % — ABNORMAL HIGH (ref 5–13)
Monocytes Absolute: 0.8 10*3/uL (ref 0.0–1.0)
NRBC Absolute: 0 10*3/uL (ref 0.00–0.01)
Neutrophils %: 40 % (ref 32–75)
Neutrophils Absolute: 2.3 10*3/uL (ref 1.8–8.0)
Nucleated RBCs: 0 PER 100 WBC
Platelets: 545 10*3/uL — ABNORMAL HIGH (ref 150–400)
RBC: 4.08 M/uL (ref 3.80–5.20)
RDW: 13.8 % (ref 11.5–14.5)
WBC: 5.9 10*3/uL (ref 3.6–11.0)

## 2018-07-14 LAB — POCT GLUCOSE
POC Glucose: 79 mg/dL (ref 65–100)
POC Glucose: 70 mg/dL (ref 65–100)
POC Glucose: 89 mg/dL (ref 65–100)
POC Glucose: 85 mg/dL (ref 65–100)
POC Glucose: 86 mg/dL (ref 65–100)
POC Glucose: 89 mg/dL (ref 65–100)

## 2018-07-14 LAB — COMPREHENSIVE METABOLIC PANEL
ALT: 18 U/L (ref 12–78)
AST: 23 U/L (ref 15–37)
Albumin/Globulin Ratio: 0.7 — ABNORMAL LOW (ref 1.1–2.2)
Albumin: 2.9 g/dL — ABNORMAL LOW (ref 3.5–5.0)
Alkaline Phosphatase: 46 U/L (ref 45–117)
Anion Gap: 10 mmol/L (ref 5–15)
BUN: 5 MG/DL — ABNORMAL LOW (ref 6–20)
Bun/Cre Ratio: 6 — ABNORMAL LOW (ref 12–20)
CO2: 24 mmol/L (ref 21–32)
Calcium: 8.9 MG/DL (ref 8.5–10.1)
Chloride: 111 mmol/L — ABNORMAL HIGH (ref 97–108)
Creatinine: 0.83 MG/DL (ref 0.55–1.02)
EGFR IF NonAfrican American: >60 mL/min/{1.73_m2} (ref >60)
GFR African American: >60 mL/min/{1.73_m2} (ref >60)
Globulin: 3.9 g/dL (ref 2.0–4.0)
Glucose: 94 mg/dL (ref 65–100)
Potassium: 3.2 mmol/L — ABNORMAL LOW (ref 3.5–5.1)
Sodium: 145 mmol/L (ref 136–145)
Total Bilirubin: 0.6 MG/DL (ref 0.2–1.0)
Total Protein: 6.8 g/dL (ref 6.4–8.2)

## 2018-07-14 MED ORDER — DIVALPROEX 500 MG TAB, DELAYED RELEASE
500 mg | Freq: Two times a day (BID) | ORAL | Status: DC
Start: 2018-07-14 — End: 2018-07-15
  Administered 2018-07-14 – 2018-07-15 (×3): via ORAL

## 2018-07-14 MED ORDER — POTASSIUM CHLORIDE SR 10 MEQ TAB
10 mEq | ORAL | Status: DC
Start: 2018-07-14 — End: 2018-07-14

## 2018-07-14 MED ORDER — POTASSIUM CHLORIDE 20 MEQ ORAL PACKET FOR SOLUTION
20 mEq | ORAL | Status: AC
Start: 2018-07-14 — End: 2018-07-14
  Administered 2018-07-14: 15:00:00 via ORAL

## 2018-07-14 MED ORDER — GLUCAGON 1 MG INJECTION
1 mg | INTRAMUSCULAR | Status: DC | PRN
Start: 2018-07-14 — End: 2018-07-15

## 2018-07-14 MED ORDER — DEXTROSE 50% IN WATER (D50W) IV SYRG
INTRAVENOUS | Status: DC | PRN
Start: 2018-07-14 — End: 2018-07-15

## 2018-07-14 MED ORDER — GLUCOSE 4 GRAM CHEWABLE TAB
4 gram | ORAL | Status: DC | PRN
Start: 2018-07-14 — End: 2018-07-15

## 2018-07-14 MED ORDER — LACOSAMIDE 100 MG TAB
100 mg | Freq: Two times a day (BID) | ORAL | Status: DC
Start: 2018-07-14 — End: 2018-07-15
  Administered 2018-07-14 – 2018-07-15 (×3): via ORAL

## 2018-07-14 MED FILL — BD POSIFLUSH NORMAL SALINE 0.9 % INJECTION SYRINGE: INTRAMUSCULAR | Qty: 10

## 2018-07-14 MED FILL — CHILDREN'S ASPIRIN 81 MG CHEWABLE TABLET: 81 mg | ORAL | Qty: 1

## 2018-07-14 MED FILL — VIMPAT 200 MG/20 ML INTRAVENOUS SOLUTION: 200 mg/20 mL | INTRAVENOUS | Qty: 20

## 2018-07-14 MED FILL — VIMPAT 200 MG/20 ML INTRAVENOUS SOLUTION: 200 mg/20 mL | INTRAVENOUS | Qty: 10

## 2018-07-14 MED FILL — VALPROATE SODIUM 100 MG/ML IV: 500 mg/5 mL (100 mg/mL) | INTRAVENOUS | Qty: 5

## 2018-07-14 MED FILL — KLOR-CON 20 MEQ ORAL PACKET: 20 mEq | ORAL | Qty: 2

## 2018-07-14 MED FILL — VIMPAT 100 MG TABLET: 100 mg | ORAL | Qty: 1

## 2018-07-14 MED FILL — TOPIRAMATE 100 MG TAB: 100 mg | ORAL | Qty: 1

## 2018-07-14 MED FILL — DIVALPROEX 500 MG TAB, DELAYED RELEASE: 500 mg | ORAL | Qty: 1

## 2018-07-14 MED FILL — TRICOR 48 MG TABLET: 48 mg | ORAL | Qty: 1

## 2018-07-14 MED FILL — ARICEPT 5 MG TABLET: 5 mg | ORAL | Qty: 2

## 2018-07-14 MED FILL — NAMENDA 10 MG TABLET: 10 mg | ORAL | Qty: 1

## 2018-07-14 MED FILL — VALPROATE SODIUM 100 MG/ML IV: 500 mg/5 mL (100 mg/mL) | INTRAVENOUS | Qty: 2.5

## 2018-07-14 MED FILL — CITALOPRAM 20 MG TAB: 20 mg | ORAL | Qty: 1

## 2018-07-14 MED FILL — ENOXAPARIN 40 MG/0.4 ML SUB-Q SYRINGE: 40 mg/0.4 mL | SUBCUTANEOUS | Qty: 0.4
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
## 2018-07-14 NOTE — Progress Notes (Signed)
Report received from Meg , RN. SBAR were discussed.    English H Hosay, RN

## 2018-07-14 NOTE — Progress Notes (Signed)
Orders received, chart reviewed and patient evaluated by physical therapy. Full evaluation to follow.     Brandon K Smith, PT, DPT   Board-Certified Geriatric Clinical Specialist   Certified Exercise Expert for Aging Adults

## 2018-07-14 NOTE — Progress Notes (Signed)
Patient quiet all day. She responds and is oriented but does not initiate conversation. She does not feed herself much but when I fed her dinner she ate 95%. She says they feed her where she lives. Up to bathroom to void.
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
## 2018-07-14 NOTE — Progress Notes (Signed)
Patient quiet all day. She responds and is oriented but does not initiate conversation. She does not feed herself much but when I fed her dinner she ate 95%. She says they feed her where she lives. Up to bathroom to void.
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
## 2018-07-14 NOTE — Progress Notes (Signed)
Orders received, chart reviewed and patient evaluated by physical therapy. Full evaluation to follow.     Horton Chin, PT, DPT   Board-Certified Geriatric Clinical Specialist   Certified Exercise Expert for Aging Adults

## 2018-07-15 ENCOUNTER — Inpatient Hospital Stay: Admit: 2018-07-15 | Payer: MEDICARE | Primary: Internal Medicine

## 2018-07-15 LAB — GLUCOSE, POC
Glucose (POC): 102 mg/dL — ABNORMAL HIGH (ref 65–100)
Glucose (POC): 79 mg/dL (ref 65–100)
Glucose (POC): 92 mg/dL (ref 65–100)

## 2018-07-15 LAB — CBC WITH AUTOMATED DIFF
ABS. BASOPHILS: 0 10*3/uL (ref 0.0–0.1)
ABS. EOSINOPHILS: 0.2 10*3/uL (ref 0.0–0.4)
ABS. IMM. GRANS.: 0 10*3/uL (ref 0.00–0.04)
ABS. LYMPHOCYTES: 2.1 10*3/uL (ref 0.8–3.5)
ABS. MONOCYTES: 0.7 10*3/uL (ref 0.0–1.0)
ABS. NEUTROPHILS: 1.6 10*3/uL — ABNORMAL LOW (ref 1.8–8.0)
ABSOLUTE NRBC: 0 10*3/uL (ref 0.00–0.01)
BASOPHILS: 1 % (ref 0–1)
EOSINOPHILS: 5 % (ref 0–7)
HCT: 38.7 % (ref 35.0–47.0)
HGB: 12.2 g/dL (ref 11.5–16.0)
IMMATURE GRANULOCYTES: 0 % (ref 0.0–0.5)
LYMPHOCYTES: 45 % (ref 12–49)
MCH: 31.1 PG (ref 26.0–34.0)
MCHC: 31.5 g/dL (ref 30.0–36.5)
MCV: 98.7 FL (ref 80.0–99.0)
MONOCYTES: 14 % — ABNORMAL HIGH (ref 5–13)
MPV: 10.5 FL (ref 8.9–12.9)
NEUTROPHILS: 35 % (ref 32–75)
NRBC: 0 PER 100 WBC
PLATELET: 511 10*3/uL — ABNORMAL HIGH (ref 150–400)
RBC: 3.92 M/uL (ref 3.80–5.20)
RDW: 14.5 % (ref 11.5–14.5)
WBC: 4.7 10*3/uL (ref 3.6–11.0)

## 2018-07-15 LAB — METABOLIC PANEL, COMPREHENSIVE
A-G Ratio: 0.8 — ABNORMAL LOW (ref 1.1–2.2)
ALT (SGPT): 15 U/L (ref 12–78)
AST (SGOT): 21 U/L (ref 15–37)
Albumin: 2.7 g/dL — ABNORMAL LOW (ref 3.5–5.0)
Alk. phosphatase: 45 U/L (ref 45–117)
Anion gap: 6 mmol/L (ref 5–15)
BUN/Creatinine ratio: 12 (ref 12–20)
BUN: 10 MG/DL (ref 6–20)
Bilirubin, total: 0.6 MG/DL (ref 0.2–1.0)
CO2: 25 mmol/L (ref 21–32)
Calcium: 8.8 MG/DL (ref 8.5–10.1)
Chloride: 111 mmol/L — ABNORMAL HIGH (ref 97–108)
Creatinine: 0.85 MG/DL (ref 0.55–1.02)
GFR est AA: >60 mL/min/{1.73_m2} (ref >60)
GFR est non-AA: >60 mL/min/{1.73_m2} (ref >60)
Globulin: 3.6 g/dL (ref 2.0–4.0)
Glucose: 62 mg/dL — ABNORMAL LOW (ref 65–100)
Potassium: 3.8 mmol/L (ref 3.5–5.1)
Protein, total: 6.3 g/dL — ABNORMAL LOW (ref 6.4–8.2)
Sodium: 142 mmol/L (ref 136–145)

## 2018-07-15 LAB — VALPROIC ACID
Valproic Acid: 79 ug/ml (ref 50–100)
Valproic acid: 79 ug/ml (ref 50–100)

## 2018-07-15 LAB — COMPREHENSIVE METABOLIC PANEL
ALT: 15 U/L (ref 12–78)
AST: 21 U/L (ref 15–37)
Albumin/Globulin Ratio: 0.8 — ABNORMAL LOW (ref 1.1–2.2)
Albumin: 2.7 g/dL — ABNORMAL LOW (ref 3.5–5.0)
Alkaline Phosphatase: 45 U/L (ref 45–117)
Anion Gap: 6 mmol/L (ref 5–15)
BUN: 10 MG/DL (ref 6–20)
Bun/Cre Ratio: 12 (ref 12–20)
CO2: 25 mmol/L (ref 21–32)
Calcium: 8.8 MG/DL (ref 8.5–10.1)
Chloride: 111 mmol/L — ABNORMAL HIGH (ref 97–108)
Creatinine: 0.85 MG/DL (ref 0.55–1.02)
EGFR IF NonAfrican American: >60 mL/min/{1.73_m2} (ref >60)
GFR African American: >60 mL/min/{1.73_m2} (ref >60)
Globulin: 3.6 g/dL (ref 2.0–4.0)
Glucose: 62 mg/dL — ABNORMAL LOW (ref 65–100)
Potassium: 3.8 mmol/L (ref 3.5–5.1)
Sodium: 142 mmol/L (ref 136–145)
Total Bilirubin: 0.6 MG/DL (ref 0.2–1.0)
Total Protein: 6.3 g/dL — ABNORMAL LOW (ref 6.4–8.2)

## 2018-07-15 LAB — CBC WITH AUTO DIFFERENTIAL
Basophils %: 1 % (ref 0–1)
Basophils Absolute: 0 10*3/uL (ref 0.0–0.1)
Eosinophils %: 5 % (ref 0–7)
Eosinophils Absolute: 0.2 10*3/uL (ref 0.0–0.4)
Granulocyte Absolute Count: 0 10*3/uL (ref 0.00–0.04)
Hematocrit: 38.7 % (ref 35.0–47.0)
Hemoglobin: 12.2 g/dL (ref 11.5–16.0)
Immature Granulocytes: 0 % (ref 0.0–0.5)
Lymphocytes %: 45 % (ref 12–49)
Lymphocytes Absolute: 2.1 10*3/uL (ref 0.8–3.5)
MCH: 31.1 PG (ref 26.0–34.0)
MCHC: 31.5 g/dL (ref 30.0–36.5)
MCV: 98.7 FL (ref 80.0–99.0)
MPV: 10.5 FL (ref 8.9–12.9)
Monocytes %: 14 % — ABNORMAL HIGH (ref 5–13)
Monocytes Absolute: 0.7 10*3/uL (ref 0.0–1.0)
NRBC Absolute: 0 10*3/uL (ref 0.00–0.01)
Neutrophils %: 35 % (ref 32–75)
Neutrophils Absolute: 1.6 10*3/uL — ABNORMAL LOW (ref 1.8–8.0)
Nucleated RBCs: 0 PER 100 WBC
Platelets: 511 10*3/uL — ABNORMAL HIGH (ref 150–400)
RBC: 3.92 M/uL (ref 3.80–5.20)
RDW: 14.5 % (ref 11.5–14.5)
WBC: 4.7 10*3/uL (ref 3.6–11.0)

## 2018-07-15 LAB — POCT GLUCOSE
POC Glucose: 102 mg/dL — ABNORMAL HIGH (ref 65–100)
POC Glucose: 79 mg/dL (ref 65–100)
POC Glucose: 92 mg/dL (ref 65–100)

## 2018-07-15 MED FILL — TRICOR 48 MG TABLET: 48 mg | ORAL | Qty: 1

## 2018-07-15 MED FILL — DIVALPROEX 500 MG TAB, DELAYED RELEASE: 500 mg | ORAL | Qty: 1

## 2018-07-15 MED FILL — TOPIRAMATE 100 MG TAB: 100 mg | ORAL | Qty: 1

## 2018-07-15 MED FILL — CHILDREN'S ASPIRIN 81 MG CHEWABLE TABLET: 81 mg | ORAL | Qty: 1

## 2018-07-15 MED FILL — NAMENDA 10 MG TABLET: 10 mg | ORAL | Qty: 1

## 2018-07-15 MED FILL — ARICEPT 5 MG TABLET: 5 mg | ORAL | Qty: 2

## 2018-07-15 MED FILL — VIMPAT 100 MG TABLET: 100 mg | ORAL | Qty: 1

## 2018-07-15 MED FILL — CITALOPRAM 20 MG TAB: 20 mg | ORAL | Qty: 1
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
## 2018-07-15 NOTE — Other (Signed)
[facility]. Discharge instructions given to daughter. [facility]     [facility]. All questions answered.     IV taken out and tele box off.     Patient is going to get dressed and daughter will transport patient.     Patient is dressed and tech to patient downstairs in wheelchair.

## 2018-07-15 NOTE — Progress Notes (Signed)
TOC:    LTC  Family to Transport  2nd IM Medicare Letter      CM: Kia Stanfield is currently working with pt in the Neuro Unit.  CM informed that pt will be d/c today and will transition back to [facility].  CM contact facility and informed them that pt will return back to [facility].    Pt's daughter informed of the following and will transport pt back to facility.  Pt's nurse aware of call report info.    CM completed the needs of the pt at this time.    Kia Stanfield, MSW, CM  [phone]
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
## 2018-07-15 NOTE — Progress Notes (Signed)
TOC:    LTC  Family to Transport  2nd IM Medicare Letter      CM: Meghan [name] is currently working with pt in the Neuro Unit.  CM informed that pt will be d/c today and will transition back to [facility].  CM contact facility and informed them that pt will return back to [facility] and rehab.    Pt's daughter informed of the following and will transport pt back to facility.  Pt's nurse aware of call report info.    CM completed the needs of the pt at this time.    Meghan [name], MSW, [place]  [id number]
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
## 2018-07-16 LAB — DUPLEX CAROTID BILATERAL
LEFT EXTERNAL CAROTID ARTERY D: 4.75 cm/s
LEFT SUBCLAVIAN ARTERY D: 0 cm/s
LEFT VERTEBRAL ARTERY D: 11.25 cm/s
Left CCA dist dias: 14.6 cm/s
Left CCA dist sys: 63.7 cm/s
Left CCA prox dias: 14.2 cm/s
Left CCA prox sys: 72.4 cm/s
Left ECA sys: 40.1 cm/s
Left ICA dist dias: 18 cm/s
Left ICA dist sys: 59.4 cm/s
Left ICA mid dias: 13.6 cm/s
Left ICA mid sys: 46.4 cm/s
Left ICA prox dias: 15.3 cm/s
Left ICA prox sys: 54.8 cm/s
Left ICA/CCA sys: 0.93
Left subclavian sys: 53.4 cm/s
Left vertebral sys: 41.8 cm/s
RIGHT EXTERNAL CAROTID ARTERY D: 5.21 cm/s
RIGHT SUBCLAVIAN ARTERY D: 0 cm/s
RIGHT VERTEBRAL ARTERY D: 11.52 cm/s
Right CCA dist dias: 15.1 cm/s
Right CCA prox dias: 15.1 cm/s
Right CCA prox sys: 78.3 cm/s
Right ICA dist dias: 12.7 cm/s
Right ICA dist sys: 38.4 cm/s
Right ICA mid dias: 10.5 cm/s
Right ICA mid sys: 34.4 cm/s
Right ICA prox dias: 8.7 cm/s
Right ICA prox sys: 31.8 cm/s
Right ICA/CCA sys: 0.7
Right cca dist sys: 57.2 cm/s
Right eca sys: 31.8 cm/s
Right subclavian sys: 66.5 cm/s
Right vertebral sys: 46.5 cm/s

## 2018-07-16 LAB — TOPIRAMATE
Topiramate: 2.3 ug/mL (ref 2.0–25.0)
Topiramate: 3.9 ug/mL (ref 2.0–25.0)
Topiramate: 4 ug/mL (ref 2.0–25.0)

## 2018-07-16 LAB — LACOSAMIDE
Lacosamide: 8.4 ug/mL (ref 5.0–10.0)
Lacosamide: 8.4 ug/mL (ref 5.0–10.0)
Lacosamide: 8.9 ug/mL (ref 5.0–10.0)

## 2018-07-16 LAB — VAS DUP CAROTID BILATERAL
Left CCA dist EDV: 14.6 cm/s
Left CCA dist PSV: 63.7 cm/s
Left CCA prox EDV: 14.2 cm/s
Left CCA prox PSV: 72.4 cm/s
Left ECA EDV: 4.75 cm/s
Left ECA PSV: 40.1 cm/s
Left ICA dist EDV: 18 cm/s
Left ICA dist PSV: 59.4 cm/s
Left ICA mid EDV: 13.6 cm/s
Left ICA mid PSV: 46.4 cm/s
Left ICA prox EDV: 15.3 cm/s
Left ICA prox PSV: 54.8 cm/s
Left ICA/CCA PSV: 0.93
Left subclavian EDV: 0 cm/s
Left subclavian PSV: 53.4 cm/s
Left vertebral EDV: 11.25 cm/s
Left vertebral PSV: 41.8 cm/s
Right CCA dist EDV: 15.1 cm/s
Right CCA prox EDV: 15.1 cm/s
Right CCA prox PSV: 78.3 cm/s
Right ECA EDV: 5.21 cm/s
Right ECA PSV: 31.8 cm/s
Right ICA dist EDV: 12.7 cm/s
Right ICA dist PSV: 38.4 cm/s
Right ICA mid EDV: 10.5 cm/s
Right ICA mid PSV: 34.4 cm/s
Right ICA prox EDV: 8.7 cm/s
Right ICA prox PSV: 31.8 cm/s
Right ICA/CCA PSV: 0.7
Right cca dist PSV: 57.2 cm/s
Right subclavian EDV: 0 cm/s
Right subclavian PSV: 66.5 cm/s
Right vertebral EDV: 11.52 cm/s
Right vertebral PSV: 46.5 cm/s

## 2018-07-16 NOTE — Progress Notes (Signed)
Patient discharged to a SNF Preferred Provider Network facility, [facility]. ([facility].  Patient discharged to [facility] 07/15/18

## 2018-07-16 NOTE — Progress Notes (Signed)
Patient [facility].  [facility] 07[facility]15/18

## 2018-07-17 ENCOUNTER — Ambulatory Visit
Admit: 2018-07-17 | Discharge: 2018-07-17 | Payer: PRIVATE HEALTH INSURANCE | Attending: Nurse Practitioner | Primary: Internal Medicine

## 2018-07-17 ENCOUNTER — Ambulatory Visit: Attending: Nurse Practitioner | Primary: Internal Medicine

## 2018-07-17 DIAGNOSIS — Z09 Encounter for follow-up examination after completed treatment for conditions other than malignant neoplasm: Secondary | ICD-10-CM

## 2018-07-17 MED ORDER — GLUCOSE 4 GRAM CHEWABLE TAB
4 gram | ORAL_TABLET | ORAL | 0 refills | Status: AC | PRN
Start: 2018-07-17 — End: ?

## 2018-07-17 NOTE — Progress Notes (Signed)
Chief Complaint   Patient presents with   ??? Hospital Follow Up     hyper glycemia     1. Have you been to the ER, urgent care clinic since your last visit?  Hospitalized since your last visit? Yes Blood sugar issues    2. Have you seen or consulted any other health care providers outside of the [facility] since your last visit?  Include any pap smears or colon screening. No
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
## 2018-07-17 NOTE — Progress Notes (Signed)
 Chief Complaint   Patient presents with   . Hospital Follow Up     hyper glycemia     1. Have you been to the ER, urgent care clinic since your last visit?  Hospitalized since your last visit? Yes Blood sugar issues    2. Have you seen or consulted any other health care providers outside of the [facility] since your last visit?  Include any pap smears or colon screening. No
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
## 2018-07-24 ENCOUNTER — Inpatient Hospital Stay
Admit: 2018-07-24 | Discharge: 2018-07-25 | Disposition: A | Discharge status: Home / Self Care | Payer: MEDICARE | Attending: Emergency Medicine

## 2018-07-24 ENCOUNTER — Emergency Department: Admit: 2018-07-25 | Payer: MEDICARE | Primary: Internal Medicine

## 2018-07-24 DIAGNOSIS — S098XXA Other specified injuries of head, initial encounter: Secondary | ICD-10-CM

## 2018-07-24 NOTE — ED Notes (Signed)
Patient resting on stretcher.  Denies pain.  Awaiting radiology results.

## 2018-07-24 NOTE — ED Notes (Signed)
Patient with approx. 3cm left lateral forehead laceration, bleeding controlled.  Cleaned with normal saline.  Bandaid attached.  Clean, dry, intact.

## 2018-07-24 NOTE — ED Notes (Signed)
Report given to Devin, RN.  All comments and concerns addressed.  Provider at bedside with suture tray suturing laceration.

## 2018-07-24 NOTE — ED Triage Notes (Signed)
EMS reports Pt fell at long term care facility. Reports small lac to L aspect of forehead. Pt denies any pain.
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
## 2018-07-24 NOTE — ED Triage Notes (Signed)
Assumed care of patient.  Long term care facility reports patient had mechanical fall at facility.  Hitting head.  Patient denies LOC.  Approx. 3cm laceration noted to lateral left forehead.  Denies pain or complaints on assessment.

## 2018-07-24 NOTE — ED Notes (Signed)
Report given to SalemDevin, Charity fundraiserN.  All comments and concerns addressed.  Provider at bedside with suture tray suturing laceration.

## 2018-07-24 NOTE — ED Notes (Signed)
EMS reports Pt fell at long term care facility. Reports small lac to L aspect of forehead. Pt denies any pain.

## 2018-07-24 NOTE — ED Notes (Signed)
Patient resting on stretcher.  Denies pain.  Awaiting radiology results.
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
## 2018-07-24 NOTE — ED Notes (Signed)
Assumed care of patient.  Long term care facility reports patient had mechanical fall at facility.  Hitting head.  Patient denies LOC.  Approx. 3cm laceration noted to lateral left forehead.  Denies pain or complaints on assessment.

## 2018-07-24 NOTE — ED Notes (Signed)
Patient with approx. 3cm left lateral forehead laceration, bleeding controlled.  Cleaned with normal saline.  Bandaid attached.  Clean, dry, intact.

## 2018-07-25 LAB — GLUCOSE, POC: Glucose (POC): 88 mg/dL (ref 65–100)

## 2018-07-25 LAB — POCT GLUCOSE: POC Glucose: 88 mg/dL (ref 65–100)

## 2018-07-25 MED ORDER — LIDOCAINE (PF) 10 MG/ML (1 %) IJ SOLN
10 mg/mL (1 %) | Freq: Once | INTRAMUSCULAR | Status: DC
Start: 2018-07-25 — End: 2018-07-25

## 2018-07-25 MED ORDER — DIPHTH,PERTUS(AC)TETANUS VAC(PF) 2.5 LF UNIT-8 MCG-5 LF/0.5 ML INJ
Freq: Once | INTRAMUSCULAR | Status: AC
Start: 2018-07-25 — End: 2018-07-24
  Administered 2018-07-25: 03:00:00 via INTRAMUSCULAR

## 2018-07-25 MED FILL — BOOSTRIX TDAP 2.5 LF UNIT-8 MCG-5 LF/0.5 ML INTRAMUSCULAR SYRINGE: INTRAMUSCULAR | Qty: 0.5

## 2018-07-25 MED FILL — XYLOCAINE-MPF 10 MG/ML (1 %) INJECTION SOLUTION: 10 mg/mL (1 %) | INTRAMUSCULAR | Qty: 10

## 2018-07-25 NOTE — ED Notes (Signed)
Pt d/c back to long term care facility. NAD, no c/o pain or discomfort at this time.

## 2018-07-25 NOTE — ED Notes (Signed)
Pt d/c back to long term care facility. NAD, no c/o pain or discomfort at this time.

## 2018-08-01 ENCOUNTER — Emergency Department: Admit: 2018-08-01 | Payer: MEDICARE | Primary: Internal Medicine

## 2018-08-01 ENCOUNTER — Inpatient Hospital Stay
Admit: 2018-08-01 | Discharge: 2018-08-02 | Disposition: A | Discharge status: Skilled Nursing Facility | Payer: MEDICARE | Attending: Emergency Medicine

## 2018-08-01 DIAGNOSIS — R4182 Altered mental status, unspecified: Secondary | ICD-10-CM

## 2018-08-01 MED ORDER — IOPAMIDOL 76 % IV SOLN
370 mg iodine /mL (76 %) | Freq: Once | INTRAVENOUS | Status: AC
Start: 2018-08-01 — End: 2018-08-01
  Administered 2018-08-01: via INTRAVENOUS

## 2018-08-01 MED ORDER — SODIUM CHLORIDE 0.9 % IJ SYRG
Freq: Once | INTRAMUSCULAR | Status: AC
Start: 2018-08-01 — End: 2018-08-01
  Administered 2018-08-01: via INTRAVENOUS

## 2018-08-01 MED FILL — ISOVUE-370  76 % INTRAVENOUS SOLUTION: 370 mg iodine /mL (76 %) | INTRAVENOUS | Qty: 100

## 2018-08-01 NOTE — ED Notes (Signed)
Dr. Mills at bedside to evaluate patient.
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
## 2018-08-01 NOTE — ED Notes (Signed)
Pt care handed over to Kory RN at this time. No further questions from oncoming rn.

## 2018-08-01 NOTE — ED Notes (Signed)
Code S level 2 called.

## 2018-08-01 NOTE — ED Notes (Signed)
Patient was provided with discharge instructions. Instructions and any medications were reviewed with the patient &/or family by the provider. Questions and concerns addressed by the provider. Patient was taken my ambulance out of the ED and was escorted by EMS.  Pateint going back to [facility] and [place].  Report called to [name].
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
## 2018-08-01 NOTE — ED Notes (Signed)
 Code S level 2 called

## 2018-08-01 NOTE — ED Notes (Signed)
Pt presents from [facility] after daughter found her to be more altered than normal with bilateral upper extremity weakness and slurred speech.  Hx of dementia.   [phone]

## 2018-08-02 LAB — URINALYSIS W/ RFLX MICROSCOPIC
Bilirubin, Urine: NEGATIVE
Bilirubin: NEGATIVE
Blood, Urine: NEGATIVE
Blood: NEGATIVE
Glucose, Ur: NEGATIVE mg/dL
Glucose: NEGATIVE mg/dL
Ketone: NEGATIVE mg/dL
Ketones, Urine: NEGATIVE mg/dL
Leukocyte Esterase, Urine: NEGATIVE
Leukocyte Esterase: NEGATIVE
Nitrite, Urine: NEGATIVE
Nitrites: NEGATIVE
Protein, UA: NEGATIVE mg/dL
Protein: NEGATIVE mg/dL
Specific Gravity, UA: 1.01 (ref 1.003–1.030)
Specific gravity: 1.01 (ref 1.003–1.030)
Urobilinogen, UA, POCT: 1 EU/dL (ref 0.2–1.0)
Urobilinogen: 1 EU/dL (ref 0.2–1.0)
pH (UA): 6.5 (ref 5.0–8.0)
pH, UA: 6.5 (ref 5.0–8.0)

## 2018-08-02 LAB — EKG, 12 LEAD, INITIAL
Atrial Rate: 95 {beats}/min
Calculated P Axis: 48 degrees
Calculated R Axis: 11 degrees
Calculated T Axis: 47 degrees
Diagnosis: NORMAL
P-R Interval: 148 ms
Q-T Interval: 546 ms
QRS Duration: 70 ms
QTC Calculation (Bezet): 686 ms
Ventricular Rate: 95 {beats}/min

## 2018-08-02 LAB — METABOLIC PANEL, COMPREHENSIVE
A-G Ratio: 0.7 — ABNORMAL LOW (ref 1.1–2.2)
ALT (SGPT): 20 U/L (ref 12–78)
AST (SGOT): 22 U/L (ref 15–37)
Albumin: 2.7 g/dL — ABNORMAL LOW (ref 3.5–5.0)
Alk. phosphatase: 43 U/L — ABNORMAL LOW (ref 45–117)
Anion gap: 6 mmol/L (ref 5–15)
BUN/Creatinine ratio: 13 (ref 12–20)
BUN: 14 MG/DL (ref 6–20)
Bilirubin, total: 0.3 MG/DL (ref 0.2–1.0)
CO2: 27 mmol/L (ref 21–32)
Calcium: 8.6 MG/DL (ref 8.5–10.1)
Chloride: 104 mmol/L (ref 97–108)
Creatinine: 1.1 MG/DL — ABNORMAL HIGH (ref 0.55–1.02)
GFR est AA: 60 mL/min/{1.73_m2} — ABNORMAL LOW (ref >60)
GFR est non-AA: 49 mL/min/{1.73_m2} — ABNORMAL LOW (ref >60)
Globulin: 3.7 g/dL (ref 2.0–4.0)
Glucose: 103 mg/dL — ABNORMAL HIGH (ref 65–100)
Potassium: 4.1 mmol/L (ref 3.5–5.1)
Protein, total: 6.4 g/dL (ref 6.4–8.2)
Sodium: 137 mmol/L (ref 136–145)

## 2018-08-02 LAB — CBC WITH AUTOMATED DIFF
ABS. BASOPHILS: 0 10*3/uL (ref 0.0–0.1)
ABS. EOSINOPHILS: 0 10*3/uL (ref 0.0–0.4)
ABS. IMM. GRANS.: 0.1 10*3/uL — ABNORMAL HIGH (ref 0.00–0.04)
ABS. LYMPHOCYTES: 1.7 10*3/uL (ref 0.8–3.5)
ABS. MONOCYTES: 1.3 10*3/uL — ABNORMAL HIGH (ref 0.0–1.0)
ABS. NEUTROPHILS: 6.6 10*3/uL (ref 1.8–8.0)
ABSOLUTE NRBC: 0 10*3/uL (ref 0.00–0.01)
BASOPHILS: 0 % (ref 0–1)
EOSINOPHILS: 0 % (ref 0–7)
HCT: 38.2 % (ref 35.0–47.0)
HGB: 12.3 g/dL (ref 11.5–16.0)
IMMATURE GRANULOCYTES: 1 % — ABNORMAL HIGH (ref 0.0–0.5)
LYMPHOCYTES: 18 % (ref 12–49)
MCH: 30.7 PG (ref 26.0–34.0)
MCHC: 32.2 g/dL (ref 30.0–36.5)
MCV: 95.3 FL (ref 80.0–99.0)
MONOCYTES: 13 % (ref 5–13)
MPV: 10.2 FL (ref 8.9–12.9)
NEUTROPHILS: 68 % (ref 32–75)
NRBC: 0 PER 100 WBC
PLATELET: 508 10*3/uL — ABNORMAL HIGH (ref 150–400)
RBC: 4.01 M/uL (ref 3.80–5.20)
RDW: 14.7 % — ABNORMAL HIGH (ref 11.5–14.5)
WBC: 9.7 10*3/uL (ref 3.6–11.0)

## 2018-08-02 LAB — PROTHROMBIN TIME + INR
INR: 1.2 — ABNORMAL HIGH (ref 0.9–1.1)
Prothrombin time: 12.2 s — ABNORMAL HIGH (ref 9.0–11.1)

## 2018-08-02 LAB — CBC WITH AUTO DIFFERENTIAL
Basophils %: 0 % (ref 0–1)
Basophils Absolute: 0 10*3/uL (ref 0.0–0.1)
Eosinophils %: 0 % (ref 0–7)
Eosinophils Absolute: 0 10*3/uL (ref 0.0–0.4)
Granulocyte Absolute Count: 0.1 10*3/uL — ABNORMAL HIGH (ref 0.00–0.04)
Hematocrit: 38.2 % (ref 35.0–47.0)
Hemoglobin: 12.3 g/dL (ref 11.5–16.0)
Immature Granulocytes: 1 % — ABNORMAL HIGH (ref 0.0–0.5)
Lymphocytes %: 18 % (ref 12–49)
Lymphocytes Absolute: 1.7 10*3/uL (ref 0.8–3.5)
MCH: 30.7 PG (ref 26.0–34.0)
MCHC: 32.2 g/dL (ref 30.0–36.5)
MCV: 95.3 FL (ref 80.0–99.0)
MPV: 10.2 FL (ref 8.9–12.9)
Monocytes %: 13 % (ref 5–13)
Monocytes Absolute: 1.3 10*3/uL — ABNORMAL HIGH (ref 0.0–1.0)
NRBC Absolute: 0 10*3/uL (ref 0.00–0.01)
Neutrophils %: 68 % (ref 32–75)
Neutrophils Absolute: 6.6 10*3/uL (ref 1.8–8.0)
Nucleated RBCs: 0 PER 100 WBC
Platelets: 508 10*3/uL — ABNORMAL HIGH (ref 150–400)
RBC: 4.01 M/uL (ref 3.80–5.20)
RDW: 14.7 % — ABNORMAL HIGH (ref 11.5–14.5)
WBC: 9.7 10*3/uL (ref 3.6–11.0)

## 2018-08-02 LAB — COMPREHENSIVE METABOLIC PANEL
ALT: 20 U/L (ref 12–78)
AST: 22 U/L (ref 15–37)
Albumin/Globulin Ratio: 0.7 — ABNORMAL LOW (ref 1.1–2.2)
Albumin: 2.7 g/dL — ABNORMAL LOW (ref 3.5–5.0)
Alkaline Phosphatase: 43 U/L — ABNORMAL LOW (ref 45–117)
Anion Gap: 6 mmol/L (ref 5–15)
BUN: 14 MG/DL (ref 6–20)
Bun/Cre Ratio: 13 (ref 12–20)
CO2: 27 mmol/L (ref 21–32)
Calcium: 8.6 MG/DL (ref 8.5–10.1)
Chloride: 104 mmol/L (ref 97–108)
Creatinine: 1.1 MG/DL — ABNORMAL HIGH (ref 0.55–1.02)
EGFR IF NonAfrican American: 49 mL/min/{1.73_m2} — ABNORMAL LOW (ref >60)
GFR African American: 60 mL/min/{1.73_m2} — ABNORMAL LOW (ref >60)
Globulin: 3.7 g/dL (ref 2.0–4.0)
Glucose: 103 mg/dL — ABNORMAL HIGH (ref 65–100)
Potassium: 4.1 mmol/L (ref 3.5–5.1)
Sodium: 137 mmol/L (ref 136–145)
Total Bilirubin: 0.3 MG/DL (ref 0.2–1.0)
Total Protein: 6.4 g/dL (ref 6.4–8.2)

## 2018-08-02 LAB — EKG 12-LEAD
Atrial Rate: 95 {beats}/min
Diagnosis: NORMAL
P Axis: 48 degrees
P-R Interval: 148 ms
Q-T Interval: 546 ms
QRS Duration: 70 ms
QTc Calculation (Bazett): 686 ms
R Axis: 11 degrees
T Axis: 47 degrees
Ventricular Rate: 95 {beats}/min

## 2018-08-02 LAB — PROTIME-INR
INR: 1.2 — ABNORMAL HIGH (ref 0.9–1.1)
Protime: 12.2 s — ABNORMAL HIGH (ref 9.0–11.1)

## 2018-08-06 NOTE — Progress Notes (Signed)
D/c to [facility] 07/15/18 still admitted. F/u 9d if still admitted close episode.

## 2018-08-06 NOTE — Progress Notes (Signed)
D/c to [facility] 07/15/18 still admitted. F/u 9d if still admitted close episode.

## 2018-08-15 NOTE — Progress Notes (Signed)
D/c to [facility] 07/15/18 still admitted. Episode resolved.

## 2018-08-15 NOTE — Progress Notes (Signed)
D/c to [facility] 07/15/18 still admitted. Episode resolved.

## 2018-08-20 ENCOUNTER — Ambulatory Visit
Admit: 2018-08-20 | Discharge: 2018-08-20 | Payer: PRIVATE HEALTH INSURANCE | Attending: Neurology | Primary: Internal Medicine

## 2018-08-20 ENCOUNTER — Ambulatory Visit: Attending: Neurology | Primary: Internal Medicine

## 2018-08-20 DIAGNOSIS — G40209 Localization-related (focal) (partial) symptomatic epilepsy and epileptic syndromes with complex partial seizures, not intractable, without status epilepticus: Secondary | ICD-10-CM

## 2018-08-20 MED ORDER — FOLIC ACID 1 MG TAB
1 mg | ORAL_TABLET | Freq: Every day | ORAL | 3 refills | Status: AC
Start: 2018-08-20 — End: ?

## 2018-08-20 MED ORDER — ROPINIROLE 2 MG TAB
2 mg | ORAL_TABLET | Freq: Every evening | ORAL | 2 refills | Status: DC
Start: 2018-08-20 — End: 2018-08-28

## 2018-08-20 NOTE — Progress Notes (Signed)
Neurology Progress Note    NAME:  Meghan FuchsBarbara J Kittrell   DOB:   07-Apr-1946   MRN:   [id number]     Date/Time:  08/20/2018  Subjective:     Meghan FuchsBarbara J Etzler is a 73 y.o. female here today for follow-up for seizure disorder, memory difficulty, neuropathy, pain confusion.  Patient was accompanied by her daughter-[name] to the visit.  She has been on admissions since the last visit currently in a long-term care-[facility]  She is doing much better, according to her daughter who was with her at today's visit.  Patient was more alert today, communicated more than before, according to patient's daughter she will be in the long-term facility for a while and probably move in with her when she gets a place.  According to daughter, no overt seizure since the last visit.  Patient repeats herself according to daughter and fatigue confused at times.  She also has some tremors and has had frequent falls.  At this time, I will discontinue Topamax as that sometimes creases confusional state and also speech difficulty.  Review of Systems - General ROS: positive for  - fatigue and sleep disturbance  Psychological ROS: positive for - anxiety, concentration difficulties, memory difficulties and sleep disturbances  Ophthalmic ROS: positive for - blurry vision and photophobia  ENT ROS: positive for - headaches, tinnitus, vertigo and visual changes  Allergy and Immunology ROS: negative  Hematological and Lymphatic ROS: negative  Endocrine ROS: negative  Respiratory ROS: no cough, shortness of breath, or wheezing  Cardiovascular ROS: no chest pain or dyspnea on exertion  Gastrointestinal ROS: no abdominal pain, change in bowel habits, or black or bloody stools  Genito-Urinary ROS: no dysuria, trouble voiding, or hematuria  Musculoskeletal ROS: positive for - gait disturbance, joint pain, joint stiffness, muscle pain and muscular weakness  Neurological ROS: positive for - dizziness, gait disturbance, headaches,  impaired coordination/balance, numbness/tingling, seizures, tremors, visual changes and weakness  Dermatological ROS: negative        Medications reviewed:  Current Outpatient Medications   Medication Sig Dispense Refill   ??? glucose (DEX4 GLUCOSE) 4 gram chewable tablet Take 4 Tabs by mouth as needed (as needed for hypoglycemia). 8 Tab 0   ??? memantine (NAMENDA) 10 mg tablet TAKE 1 TABLET BY MOUTH TWICE DAILY 60 Tab 0   ??? lacosamide (VIMPAT) 100 mg tab tablet TAKE 1 TABLET BY MOUTH TWICE DAILY 20 Tab 0   ??? donepezil (ARICEPT) 10 mg tablet TAKE 1 TABLET BY MOUTH EVERY NIGHT 30 Tab 0   ??? topiramate (TOPAMAX) 100 mg tablet TAKE 1 TABLET BY MOUTH EVERY NIGHT 90 Tab 2   ??? divalproex DR (DEPAKOTE) 500 mg tablet TAKE 1 TABLET BY MOUTH TWICE DAILY 180 Tab 0   ??? citalopram (CELEXA) 20 mg tablet Take 1 Tab by mouth daily. 90 Tab 3   ??? fenofibrate (LOFIBRA) 54 mg tablet Take 1 Tab by mouth nightly. 90 Tab 3   ??? cholecalciferol (VITAMIN D3) 1,000 unit cap take 1 capsule by mouth once daily 30 Cap 3   ??? fluticasone (FLONASE) 50 mcg/actuation nasal spray 1 Spray by Both Nostrils route daily. 1 Bottle 1   ??? aspirin 81 mg chewable tablet Take 81 mg by mouth daily.          Objective:   Vitals:  Vitals:    08/20/18 1124   BP: 109/74   Pulse: 68   Resp: 16   Temp: 97.5 ??F (36.4 ??C)  TempSrc: Temporal   SpO2: 95%   Weight: 150 lb 6.4 oz (68.2 kg)   Height: 5\' 5"  (1.651 m)   PainSc:   0 - No pain       Lab Data Reviewed:  Lab Results   Component Value Date/Time    WBC 9.7 08/01/2018 06:54 PM    HCT 38.2 08/01/2018 06:54 PM    HGB 12.3 08/01/2018 06:54 PM    PLATELET 508 (H) 08/01/2018 06:54 PM       Lab Results   Component Value Date/Time    Sodium 137 08/01/2018 06:54 PM    Potassium 4.1 08/01/2018 06:54 PM    Chloride 104 08/01/2018 06:54 PM    CO2 27 08/01/2018 06:54 PM    Glucose 103 (H) 08/01/2018 06:54 PM    BUN 14 08/01/2018 06:54 PM    Creatinine 1.10 (H) 08/01/2018 06:54 PM    Calcium 8.6 08/01/2018 06:54 PM        No components found for: TROPQUANT    No results found for: ANA      Lab Results   Component Value Date/Time    Hemoglobin A1c 5.1 07/11/2018 12:12 PM    Hemoglobin A1c (POC) 5.4 01/08/2018 10:09 AM        Lab Results   Component Value Date/Time    Vitamin B12 1,250 (H) 07/12/2018 01:43 PM    Folate 18.0 07/12/2018 01:43 PM       No results found for: ANA, Everlean Alstrom, XBANA    Lab Results   Component Value Date/Time    Cholesterol, total 198 02/25/2018 03:49 AM    Cholesterol (POC) 169 01/08/2018 10:10 AM    HDL Cholesterol 48 02/25/2018 03:49 AM    HDL Cholesterol (POC) 45 01/08/2018 10:10 AM    LDL Cholesterol (POC) 108 03/54/6568 10:10 AM    LDL, calculated 132 (H) 02/25/2018 03:49 AM    VLDL, calculated 18 02/25/2018 03:49 AM    Triglyceride 90 02/25/2018 03:49 AM    Triglycerides (POC) 80 01/08/2018 10:10 AM    CHOL/HDL Ratio 4.1 02/25/2018 03:49 AM         CT Results (recent):      MRI Results (recent):  Results from Hospital Encounter encounter on 07/11/18   MRI BRAIN W WO CONT    Narrative EXAM:  MRI BRAIN W WO CONT    INDICATION:    altered mental status    COMPARISON:  February 24, 2018.    CONTRAST: 15 ml Dotarem.    TECHNIQUE:    Multiplanar multisequence acquisition without and with contrast of the brain.    FINDINGS:  Diffusion imaging does not show acute ischemic changes. There is no extra-axial  fluid collection hemorrhage or shift. Minimal nonspecific white changes.  Flow-voids in major vessels at the base of the brain are present. There is no  mass.  Partially empty sella.  No enhancing lesion or masses.      Impression IMPRESSION: No acute findings no mass. Minimal nonspecific white matter changes.       IR Results (recent):  No results found for this or any previous visit.    VAS/US Results (recent):  Results from Hospital Encounter encounter on 03/22/17   DUPLEX CAROTID BILATERAL       PHYSICAL EXAM:  General:    Alert, cooperative, no distress, appears stated age.      Head:   Normocephalic, without obvious abnormality, atraumatic.  Eyes:   Conjunctivae/corneas clear.  PERRLA  Nose:  Nares normal. No drainage or sinus tenderness.  Throat:    Lips, mucosa, and tongue normal.  No Thrush  Neck:  Supple, symmetrical,  no adenopathy, thyroid: non tender    no carotid bruit and no JVD.  Back:    Symmetric,  No CVA tenderness.  Lungs:   Clear to auscultation bilaterally.  No Wheezing or Rhonchi. No rales.  Chest wall:  No tenderness or deformity. No Accessory muscle use.  Heart:   Regular rate and rhythm,  no murmur, rub or gallop.  Abdomen:   Soft, non-tender. Not distended.  Bowel sounds normal. No masses  Extremities: Extremities normal, atraumatic, No cyanosis.  No edema. No clubbing  Skin:     Texture, turgor normal. No rashes or lesions.  Not Jaundiced  Lymph nodes: Cervical, supraclavicular normal.  Psych:  .  Depressed.  Patient's.      NEUROLOGICAL EXAM:  Appearance:  The patient is well developed, well nourished,  and is in no acute distress.   Mental Status: Oriented to time, place and person. Mood and affect appropriate.   Cranial Nerves:   Intact visual fields. Fundi are benign. PERLA, EOM's full, no nystagmus, no ptosis. Facial sensation is normal.  Facial twitching.  Corneal reflexes are intact. Facial movement is symmetric. Hearing is normal bilaterally. Palate is midline with normal sternocleidomastoid and trapezius muscles are normal. Tongue is midline.   Motor:   Moves all extremities  . Normal bulk and tone. No fasciculations.   Reflexes:   Deep tendon reflexes 2+/4 and symmetrical.   Sensory:    Dysesthesia to touch, pinprick and vibration.   Gait:   Unsteady gait.  Wheelchair-bound   Tremor:   Tremor noted.   Cerebellar:  No cerebellar signs present.   Neurovascular:  Normal heart sounds and regular rhythm, peripheral pulses intact, and no carotid bruits.       Assesment  1. Partial symptomatic epilepsy with complex partial seizures, not  intractable, without status epilepticus (HCC)  Continue management    2. 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 Gait disorder  Physical therapy    5. Parkinsonism, unspecified Parkinsonism type (HCC)  We will monitor    ___________________________________________________  PLAN: Medication and plan discussed with patient and daughter.  Note sent to the healthcare facility.      ICD-10-CM ICD-9-CM    1. Partial symptomatic epilepsy with complex partial seizures, not intractable, without status epilepticus (HCC) G40.209 345.40    2. 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 Gait disorder R26.9 781.2    5. Parkinsonism, unspecified Parkinsonism type (HCC) G20 332.0      Follow-up and Dispositions    ?? Return in about 2 months (around 10/19/2018).         :    ___________________________________________________    Attending Physician: Forrestine Him, MD

## 2018-08-20 NOTE — Progress Notes (Signed)
Neurology Progress Note    NAME:  Meghan [name]   DOB:   1946-05-16   MRN:   [id number]     Date/Time:  08/20/2018  Subjective:     Meghan [name] is a 73 y.o. female here today for follow-up for seizure disorder, memory difficulty, neuropathy, pain confusion.  Patient was accompanied by her daughter-Meghan [name] to the visit.  She has been on admissions since the last visit currently in a long-term care-[facility]  She is doing much better, according to her daughter who was with her at today's visit.  Patient was more alert today, communicated more than before, according to patient's daughter she will be in the long-term facility for a while and probably move in with her when she gets a place.  According to daughter, no overt seizure since the last visit.  Patient repeats herself according to daughter and fatigue confused at times.  She also has some tremors and has had frequent falls.  At this time, I will discontinue Topamax as that sometimes creases confusional state and also speech difficulty.  Review of Systems - General ROS: positive for  - fatigue and sleep disturbance  Psychological ROS: positive for - anxiety, concentration difficulties, memory difficulties and sleep disturbances  Ophthalmic ROS: positive for - blurry vision and photophobia  ENT ROS: positive for - headaches, tinnitus, vertigo and visual changes  Allergy and Immunology ROS: negative  Hematological and Lymphatic ROS: negative  Endocrine ROS: negative  Respiratory ROS: no cough, shortness of breath, or wheezing  Cardiovascular ROS: no chest pain or dyspnea on exertion  Gastrointestinal ROS: no abdominal pain, change in bowel habits, or black or bloody stools  Genito-Urinary ROS: no dysuria, trouble voiding, or hematuria  Musculoskeletal ROS: positive for - gait disturbance, joint pain, joint stiffness, muscle pain and muscular weakness  Neurological ROS: positive for - dizziness, gait disturbance, headaches, impaired  coordination/balance, numbness/tingling, seizures, tremors, visual changes and weakness  Dermatological ROS: negative        Medications reviewed:  Current Outpatient Medications   Medication Sig Dispense Refill   ??? glucose (DEX4 GLUCOSE) 4 gram chewable tablet Take 4 Tabs by mouth as needed (as needed for hypoglycemia). 8 Tab 0   ??? memantine (NAMENDA) 10 mg tablet TAKE 1 TABLET BY MOUTH TWICE DAILY 60 Tab 0   ??? lacosamide (VIMPAT) 100 mg tab tablet TAKE 1 TABLET BY MOUTH TWICE DAILY 20 Tab 0   ??? donepezil (ARICEPT) 10 mg tablet TAKE 1 TABLET BY MOUTH EVERY NIGHT 30 Tab 0   ??? topiramate (TOPAMAX) 100 mg tablet TAKE 1 TABLET BY MOUTH EVERY NIGHT 90 Tab 2   ??? divalproex DR (DEPAKOTE) 500 mg tablet TAKE 1 TABLET BY MOUTH TWICE DAILY 180 Tab 0   ??? citalopram (CELEXA) 20 mg tablet Take 1 Tab by mouth daily. 90 Tab 3   ??? fenofibrate (LOFIBRA) 54 mg tablet Take 1 Tab by mouth nightly. 90 Tab 3   ??? cholecalciferol (VITAMIN D3) 1,000 unit cap take 1 capsule by mouth once daily 30 Cap 3   ??? fluticasone (FLONASE) 50 mcg/actuation nasal spray 1 Spray by Both Nostrils route daily. 1 Bottle 1   ??? aspirin 81 mg chewable tablet Take 81 mg by mouth daily.          Objective:   Vitals:  Vitals:    08/20/18 1124   BP: 109/74   Pulse: 68   Resp: 16   Temp: 97.5 ??F (36.4 ??C)  TempSrc: Temporal   SpO2: 95%   Weight: 150 lb 6.4 oz (68.2 kg)   Height: 5\' 5"  (1.651 m)   PainSc:   0 - No pain       Lab Data Reviewed:  Lab Results   Component Value Date/Time    WBC 9.7 08/01/2018 06:54 PM    HCT 38.2 08/01/2018 06:54 PM    HGB 12.3 08/01/2018 06:54 PM    PLATELET 508 (H) 08/01/2018 06:54 PM       Lab Results   Component Value Date/Time    Sodium 137 08/01/2018 06:54 PM    Potassium 4.1 08/01/2018 06:54 PM    Chloride 104 08/01/2018 06:54 PM    CO2 27 08/01/2018 06:54 PM    Glucose 103 (H) 08/01/2018 06:54 PM    BUN 14 08/01/2018 06:54 PM    Creatinine 1.10 (H) 08/01/2018 06:54 PM    Calcium 8.6 08/01/2018 06:54 PM       No components found  for: TROPQUANT    No results found for: ANA      Lab Results   Component Value Date/Time    Hemoglobin A1c 5.1 07/11/2018 12:12 PM    Hemoglobin A1c (POC) 5.4 01/08/2018 10:09 AM        Lab Results   Component Value Date/Time    Vitamin B12 1,250 (H) 07/12/2018 01:43 PM    Folate 18.0 07/12/2018 01:43 PM       No results found for: ANA, Everlean Alstrom, XBANA    Lab Results   Component Value Date/Time    Cholesterol, total 198 02/25/2018 03:49 AM    Cholesterol (POC) 169 01/08/2018 10:10 AM    HDL Cholesterol 48 02/25/2018 03:49 AM    HDL Cholesterol (POC) 45 01/08/2018 10:10 AM    LDL Cholesterol (POC) 108 63/81/7711 10:10 AM    LDL, calculated 132 (H) 02/25/2018 03:49 AM    VLDL, calculated 18 02/25/2018 03:49 AM    Triglyceride 90 02/25/2018 03:49 AM    Triglycerides (POC) 80 01/08/2018 10:10 AM    CHOL/HDL Ratio 4.1 02/25/2018 03:49 AM         CT Results (recent):      MRI Results (recent):  Results from Hospital Encounter encounter on 07/11/18   MRI BRAIN W WO CONT    Narrative EXAM:  MRI BRAIN W WO CONT    INDICATION:    altered mental status    COMPARISON:  February 24, 2018.    CONTRAST: 15 ml Dotarem.    TECHNIQUE:    Multiplanar multisequence acquisition without and with contrast of the brain.    FINDINGS:  Diffusion imaging does not show acute ischemic changes. There is no extra-axial  fluid collection hemorrhage or shift. Minimal nonspecific white changes.  Flow-voids in major vessels at the base of the brain are present. There is no  mass.  Partially empty sella.  No enhancing lesion or masses.      Impression IMPRESSION: No acute findings no mass. Minimal nonspecific white matter changes.       IR Results (recent):  No results found for this or any previous visit.    VAS/US Results (recent):  Results from Hospital Encounter encounter on 03/22/17   DUPLEX CAROTID BILATERAL       PHYSICAL EXAM:  General:    Alert, cooperative, no distress, appears stated age.     Head:   Normocephalic, without obvious  abnormality, atraumatic.  Eyes:   Conjunctivae/corneas clear.  PERRLA  Nose:  Nares normal. No drainage or sinus tenderness.  Throat:    Lips, mucosa, and tongue normal.  No Thrush  Neck:  Supple, symmetrical,  no adenopathy, thyroid: non tender    no carotid bruit and no JVD.  Back:    Symmetric,  No CVA tenderness.  Lungs:   Clear to auscultation bilaterally.  No Wheezing or Rhonchi. No rales.  Chest wall:  No tenderness or deformity. No Accessory muscle use.  Heart:   Regular rate and rhythm,  no murmur, rub or gallop.  Abdomen:   Soft, non-tender. Not distended.  Bowel sounds normal. No masses  Extremities: Extremities normal, atraumatic, No cyanosis.  No edema. No clubbing  Skin:     Texture, turgor normal. No rashes or lesions.  Not Jaundiced  Lymph nodes: Cervical, supraclavicular normal.  Psych:  .  Depressed.  Patient's.      NEUROLOGICAL EXAM:  Appearance:  The patient is well developed, well nourished,  and is in no acute distress.   Mental Status: Oriented to time, place and person. Mood and affect appropriate.   Cranial Nerves:   Intact visual fields. Fundi are benign. PERLA, EOM's full, no nystagmus, no ptosis. Facial sensation is normal.  Facial twitching.  Corneal reflexes are intact. Facial movement is symmetric. Hearing is normal bilaterally. Palate is midline with normal sternocleidomastoid and trapezius muscles are normal. Tongue is midline.   Motor:   Moves all extremities  . Normal bulk and tone. No fasciculations.   Reflexes:   Deep tendon reflexes 2+/4 and symmetrical.   Sensory:    Dysesthesia to touch, pinprick and vibration.   Gait:   Unsteady gait.  Wheelchair-bound   Tremor:   Tremor noted.   Cerebellar:  No cerebellar signs present.   Neurovascular:  Normal heart sounds and regular rhythm, peripheral pulses intact, and no carotid bruits.       Assesment  1. Partial symptomatic epilepsy with complex partial seizures, not intractable, without status epilepticus (HCC)  Continue  management    2. 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 Gait disorder  Physical therapy    5. Parkinsonism, unspecified Parkinsonism type (HCC)  We will monitor    ___________________________________________________  PLAN: Medication and plan discussed with patient and daughter.  Note sent to the healthcare facility.      ICD-10-CM ICD-9-CM    1. Partial symptomatic epilepsy with complex partial seizures, not intractable, without status epilepticus (HCC) G40.209 345.40    2. 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 Gait disorder R26.9 781.2    5. Parkinsonism, unspecified Parkinsonism type (HCC) G20 332.0      Follow-up and Dispositions    ?? Return in about 2 months (around 10/19/2018).         :    ___________________________________________________    Attending Physician: Forrestine Himletus C Neema Barreira, MD

## 2018-08-28 ENCOUNTER — Ambulatory Visit
Admit: 2018-08-28 | Discharge: 2018-08-29 | Payer: PRIVATE HEALTH INSURANCE | Attending: Nurse Practitioner | Primary: Internal Medicine

## 2018-08-28 ENCOUNTER — Encounter: Attending: Nurse Practitioner | Primary: Internal Medicine

## 2018-08-28 ENCOUNTER — Ambulatory Visit: Attending: Nurse Practitioner | Primary: Internal Medicine

## 2018-08-28 DIAGNOSIS — F0150 Vascular dementia without behavioral disturbance: Secondary | ICD-10-CM

## 2018-08-28 MED ORDER — ROPINIROLE 1 MG TAB
1 mg | ORAL_TABLET | Freq: Every evening | ORAL | 3 refills | Status: DC
Start: 2018-08-28 — End: 2018-10-18

## 2018-08-28 NOTE — Progress Notes (Signed)
Chief Complaint   Patient presents with   ??? Hospital Follow Up   ??? Blood sugar problem     1. Have you been to the ER, urgent care clinic since your last visit?  Hospitalized since your last visit?    2. Have you seen or consulted any other health care providers outside of the [facility] since your last visit?  Include any pap smears or colon screening.
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
## 2018-08-28 NOTE — Patient Instructions (Addendum)
Dementia: Care Instructions  Your Care Instructions    Dementia is a loss of mental skills that affects your daily life. It is different than the occasional trouble with memory that is part of aging. You may find it hard to remember things that you feel you should be able to remember. Or you may feel that your mind is just not working as well as usual.  Finding out that you have dementia is a shock. You may be afraid and worried about how the condition will change your life. Although there is no cure at this time, medicine may slow memory loss and improve thinking for a while. Other medicines may be able to help you sleep or cope with depression and behavior changes.  Dementia often gets worse slowly. But it can get worse quickly. As dementia gets worse, it may become harder to do common things that take planning, like making a list and going shopping. Over time, the disease may make it hard for you to take care of yourself. Some people with dementia need others to help care for them.  Dementia is different for everyone. You may be able to function well for a long time. In the early stage of the condition, you can do things at home to make life easier and safer. You also can keep doing your hobbies and other activities. Many people find comfort in planning now for their future needs.  Follow-up care is a key part of your treatment and safety. Be sure to make and go to all appointments, and call your doctor if you are having problems. It's also a good idea to know your test results and keep a list of the medicines you take.  How can you care for yourself at home?  ?? Take your medicines exactly as prescribed. Call your doctor if you think you are having a problem with your medicine.  ?? Eat healthy foods. Eat lots of whole grains, fruits, and vegetables every day. If you are not hungry, try snacks or nutritional drinks such as Boost, Ensure, or Sustacal.  ?? If you have problems sleeping:   ? Try not to nap too close to your bedtime.  ? Exercise regularly. Walking is a good choice.  ? Try a glass of warm milk or caffeine-free herbal tea before bed.  ?? Do tasks and activities during the time of day when you feel your best. It may help to develop a daily routine.  ?? Post labels, lists, and sticky notes to help you remember things. Write your activities on a calendar you can easily find. Put your clock where you can easily see it.  ?? Stay active. Take walks in familiar places, or with friends or loved ones. Try to stay active mentally too. Read and work crossword puzzles if you enjoy these activities.  ?? Do not drive unless you can pass an on-road driving test. If you are not sure if you are safe to drive, your state driver's license bureau can test you.  ?? Keep a cordless phone and a flashlight with new batteries by your bed. If possible, put a phone in each of the main rooms of your house, or carry a cell phone in case you fall and cannot reach a phone. Or, you can wear a device around your neck or wrist. You push a button that sends a signal for help.  Acknowledge your emotions and plan for the future  ?? Talk openly and honestly with your doctor.  ??   Let yourself grieve. It is common to feel angry, scared, frustrated, anxious, or depressed.  ?? Get emotional support from family, friends, a support group, or a counselor experienced in working with people who have dementia.  ?? Ask for help if you need it.  ?? Tell your doctor how you feel. You may feel upset, angry, or worried at times. Many things can cause this, including poor sleep, medicine side effects, confusion, and pain. Your doctor may be able to help you.  ?? Plan for the future.  ? Talk to your family and doctor about preparing a living will and other important papers while you can make decisions. These papers tell your doctors how to care for you at the end of your life.  ? Consider naming a person to make decisions about your care if you are  not able to.  When should you call for help?  Call 911 anytime you think you may need emergency care. For example, call if:  ?? ?? You are lost and do not know whom to call.   ?? ?? You are injured and do not know whom to call.   ??Call your doctor now or seek immediate medical care if:  ?? ?? You are more confused or upset than usual.   ?? ?? You feel like you could hurt yourself because your mind is not working well.   ??Watch closely for changes in your health, and be sure to contact your doctor if you have any problems.  Where can you learn more?  Go to InsuranceStats.cahttp://www.healthwise.net/GoodHelpConnections.  Enter 870-193-2234I615 in the search box to learn more about "Dementia: Care Instructions."  Current as of: Jan 08, 2018  Content Version: 12.2  ?? 2006-2019 [employer]. Care instructions adapted under license by [employer] (which disclaims liability or warranty for this information). If you have questions about a medical condition or this instruction, always ask your healthcare professional. [employer] disclaims any warranty or liability for your use of this information.         Epilepsy: Care Instructions  Your Care Instructions    Epilepsy is a common condition that causes repeated seizures. The seizures are caused by bursts of electrical activity in the brain that aren't normal. Seizures may cause problems with muscle control, movement, speech, vision, or awareness. They can be scary.  Epilepsy affects each person differently. Some people have only a few seizures. Others get them more often. If you know what triggers a seizure, you may be able to avoid having one.  You can take medicines to control and reduce seizures. You and your doctor will need to find the right combination, schedule, and dose of medicine. This may take time and careful changes.  Seizures may get worse and happen more often over time.  Follow-up care is a key part of your treatment and safety. Be sure to make  and go to all appointments, and call your doctor if you are having problems. It's also a good idea to know your test results and keep a list of the medicines you take.  How can you care for yourself at home?  ?? Be safe with medicines. Take your medicines exactly as prescribed. Call your doctor if you think you are having a problem with your medicine.  ?? Make a treatment plan with your doctor. Be sure to follow your plan.  ?? Try to identify and avoid things that may make you more likely to have a seizure. These may include:  ?  Not getting enough sleep.  ? Using drugs or alcohol.  ? Being emotionally stressed.  ? Skipping meals.  ?? Keep a record of any seizures you have. Note the date, time of day, and any details about the seizure that you can remember. Your doctor can use this information to plan or adjust your medicine or other treatment.  ?? Be sure that any doctor treating you for another condition knows that you have epilepsy. Each doctor should know what medicines you are taking, if any.  ?? Wear a medical ID bracelet. You can buy this at most drugstores. If you have a seizure that leaves you unconscious or unable to speak for yourself, this bracelet will let those who are treating you know that you have epilepsy.  ?? Talk to your doctor about whether it is safe for you to do certain activities, such as drive or swim.  When should you call for help?  Call 911 anytime you think you may need emergency care. For example, call if:  ?? ?? A seizure does not stop as it normally does.   ?? ?? You have new symptoms such as:  ? Numbness, tingling, or weakness on one side of your body or face.  ? Vision changes.  ? Trouble speaking or thinking clearly.   ??Call your doctor now or seek immediate medical care if:  ?? ?? You have a fever.   ?? ?? You have a severe headache.   ??Watch closely for changes in your health, and be sure to contact your doctor if:  ?? ?? The normal pattern or features of your seizures change.    Where can you learn more?  Go to [employer]Stats.ca.  Enter [phone] in the search box to learn more about "Epilepsy: Care Instructions."  Current as of: November 08, 2017  Content Version: 12.2  ?? 2006-2019 [employer]. Care instructions adapted under license by [employer] (which disclaims liability or warranty for this information). If you have questions about a medical condition or this instruction, always ask your healthcare professional. [employer]wise, Inc[employer] disclaims any warranty or liability for your use of this information.         High Cholesterol: Care Instructions  Your Care Instructions    Cholesterol is a type of fat in your blood. It is needed for many body functions, such as making new cells. Cholesterol is made by your body. It also comes from food you eat. High cholesterol means that you have too much of the fat in your blood. This raises your risk of a heart attack and stroke.  LDL and HDL are part of your total cholesterol. LDL is the "bad" cholesterol. High LDL can raise your risk for heart disease, heart attack, and stroke. HDL is the "good" cholesterol. It helps clear bad cholesterol from the body. High HDL is linked with a lower risk of heart disease, heart attack, and stroke.  Your cholesterol levels help your doctor find out your risk for having a heart attack or stroke. You and your doctor can talk about whether you need to lower your risk and what treatment is best for you.  A heart-healthy lifestyle along with medicines can help lower your cholesterol and your risk. The way you choose to lower your risk will depend on how high your risk is for heart attack and stroke. It will also depend on how you feel about taking medicines.  Follow-up care is a key part of your treatment and safety. Be sure  to make and go to all appointments, and call your doctor if you are having problems. It's also a good idea to know your test results and keep a list  of the medicines you take.  How can you care for yourself at home?  ?? Eat a variety of foods every day. Good choices include fruits, vegetables, whole grains (like oatmeal), dried beans and peas, nuts and seeds, soy products (like tofu), and fat-free or low-fat dairy products.  ?? Replace butter, margarine, and hydrogenated or partially hydrogenated oils with olive and canola oils. (Canola oil margarine without trans fat is fine.)  ?? Replace red meat with fish, poultry, and soy protein (like tofu).  ?? Limit processed and packaged foods like chips, crackers, and cookies.  ?? Bake, broil, or steam foods. Don't fry them.  ?? Be physically active. Get at least 30 minutes of exercise on most days of the week. Walking is a good choice. You also may want to do other activities, such as running, swimming, cycling, or playing tennis or team sports.  ?? Stay at a healthy weight or lose weight by making the changes in eating and physical activity listed above. Losing just a small amount of weight, even 5 to 10 pounds, can reduce your risk for having a heart attack or stroke.  ?? Do not smoke.  When should you call for help?  Watch closely for changes in your health, and be sure to contact your doctor if:  ?? ?? You need help making lifestyle changes.   ?? ?? You have questions about your medicine.   Where can you learn more?  Go to InsuranceStats.ca.  Enter [phone] in the search box to learn more about "High Cholesterol: Care Instructions."  Current as of: November 20, 2017  Content Version: 12.2  ?? 2006-2019 [employer]. Care instructions adapted under license by [employer] (which disclaims liability or warranty for this information). If you have questions about a medical condition or this instruction, always ask your healthcare professional. [employer] disclaims any warranty or liability for your use of this information.
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
## 2018-08-28 NOTE — Progress Notes (Signed)
 Chief Complaint   Patient presents with   . Hospital Follow Up   . Blood sugar problem     1. Have you been to the ER, urgent care clinic since your last visit?  Hospitalized since your last visit?    2. Have you seen or consulted any other health care providers outside of the [facility] since your last visit?  Include any pap smears or colon screening.

## 2018-10-09 ENCOUNTER — Encounter: Attending: Internal Medicine | Primary: Internal Medicine

## 2018-10-09 NOTE — Progress Notes (Deleted)
HISTORY OF PRESENT ILLNESS  Meghan [name] is a 73 y.o. female.  HPI   Pt is here to establish care.    PMHx:    FMHx:    PSHx:    SHx:    PREVENTIVE:  Colonoscopy:  Pap:  Mammogram:  Dexa:   Tdap:  Pneumovax:  Shingrix:  Flu shot:  Eye exam:  Lipids:      Patient Active Problem List    Diagnosis Date Noted   ??? Hypoglycemia 07/11/2018   ??? HLD (hyperlipidemia) 05/23/2018   ??? 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 Dementia without behavioral disturbance (HCC) 05/22/2018   ??? Acute renal insufficiency 05/22/2018   ??? Recurrent seizures (HCC) 05/18/2018   ??? Aphasia 02/23/2018   ??? Type 2 diabetes mellitus with diabetic neuropathy (HCC) 01/08/2018   ??? 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 Encephalopathy acute 07/15/2017   ??? Post-ictal coma (HCC) 07/14/2017   ??? Acute encephalopathy 07/14/2017   ??? AKI (acute kidney injury) (HCC) 06/19/2017   ??? 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 Altered mental state 06/18/2017   ??? HTN (hypertension) 06/18/2017   ??? 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 DM w/o complication type II (HCC) 03/26/2017   ??? Acute cystitis 03/26/2017   ??? Syncope 03/23/2017   ??? 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 Convulsive syncope 03/23/2017   ??? Bilateral carotid artery stenosis 03/23/2017   ??? Rhabdomyolysis 03/23/2017   ??? Cellulitis of arm 06/03/2014   ??? Seizure (HCC) 05/30/2014     Current Outpatient Medications   Medication Sig Dispense Refill   ??? rOPINIRole (REQUIP) 1 mg tablet Take 1 Tab by mouth nightly. 30 Tab 3   ??? folic acid (FOLVITE) 1 mg tablet Take 1 Tab by mouth daily. 30 Tab 3   ??? glucose (DEX4 GLUCOSE) 4 gram chewable tablet Take 4 Tabs by mouth as needed (as needed for hypoglycemia). 8 Tab 0   ??? memantine (NAMENDA) 10 mg tablet TAKE 1 TABLET BY MOUTH TWICE DAILY 60 Tab 0   ??? lacosamide (VIMPAT) 100 mg tab tablet TAKE 1 TABLET BY MOUTH TWICE DAILY 20 Tab 0    ??? donepezil (ARICEPT) 10 mg tablet TAKE 1 TABLET BY MOUTH EVERY NIGHT 30 Tab 0   ??? divalproex DR (DEPAKOTE) 500 mg tablet TAKE 1 TABLET BY MOUTH TWICE DAILY 180 Tab 0   ??? citalopram (CELEXA) 20 mg tablet Take 1 Tab by mouth daily. 90 Tab 3   ??? fenofibrate (LOFIBRA) 54 mg tablet Take 1 Tab by mouth nightly. 90 Tab 3   ??? cholecalciferol (VITAMIN D3) 1,000 unit cap take 1 capsule by mouth once daily 30 Cap 3   ??? fluticasone (FLONASE) 50 mcg/actuation nasal spray 1 Spray by Both Nostrils route daily. 1 Bottle 1   ??? aspirin 81 mg chewable tablet Take 81 mg by mouth daily.       Past Surgical History:   Procedure Laterality Date   ??? COLONOSCOPY [name] 02/12/2018    COLONOSCOPY performed by Wilfred Curtis, MD at [facility] ENDOSCOPY   ??? HX GYN      hysterectomy      Lab Results   Component Value Date/Time    WBC 9.7 08/01/2018 06:54 PM    HGB 12.3 08/01/2018 06:54 PM    HCT 38.2 08/01/2018 06:54 PM    PLATELET 508 (H) 08/01/2018 06:54 PM    MCV 95.3  08/01/2018 06:54 PM     Lab Results   Component Value Date/Time    Cholesterol, total 198 02/25/2018 03:49 AM    Cholesterol (POC) 169 01/08/2018 10:10 AM    HDL Cholesterol 48 02/25/2018 03:49 AM    LDL, calculated 132 (H) 02/25/2018 03:49 AM    LDL Cholesterol (POC) 108 57/32/2025 10:10 AM    Triglyceride 90 02/25/2018 03:49 AM    Triglycerides (POC) 80 01/08/2018 10:10 AM    CHOL/HDL Ratio 4.1 02/25/2018 03:49 AM     Lab Results   Component Value Date/Time    GFR est non-AA 49 (L) 08/01/2018 06:54 PM    GFR est AA 60 (L) 08/01/2018 06:54 PM    Creatinine 1.10 (H) 08/01/2018 06:54 PM    BUN 14 08/01/2018 06:54 PM    Sodium 137 08/01/2018 06:54 PM    Potassium 4.1 08/01/2018 06:54 PM    Chloride 104 08/01/2018 06:54 PM    CO2 27 08/01/2018 06:54 PM    Magnesium 1.8 07/14/2018 01:56 AM    Phosphorus 2.1 (L) 07/14/2018 01:56 AM        ROS    Physical Exam    ASSESSMENT and PLAN  {No Diagnosis Found}         Scribed by Laurin Coder of [employer], as dictated by Dr. Roney Mans.      Current diagnosis and concerns discussed with pt at length. Pt understands risks and benefits or current treatment plan and medications, and accepts the treatment and medication with any possible risks. Pt asks appropriate questions, which were answered. Pt was instructed to call with any concerns or problems.      I have reviewed the note documented by the scribe.  The services provided are my own.  The documentation is accurate

## 2018-10-18 ENCOUNTER — Ambulatory Visit
Admit: 2018-10-18 | Discharge: 2018-10-18 | Payer: PRIVATE HEALTH INSURANCE | Attending: Neurology | Primary: Internal Medicine

## 2018-10-18 ENCOUNTER — Ambulatory Visit: Attending: Neurology | Primary: Internal Medicine

## 2018-10-18 DIAGNOSIS — G40209 Localization-related (focal) (partial) symptomatic epilepsy and epileptic syndromes with complex partial seizures, not intractable, without status epilepticus: Secondary | ICD-10-CM

## 2018-10-18 MED ORDER — ROPINIROLE 2 MG TAB
2 mg | ORAL_TABLET | Freq: Every evening | ORAL | 0 refills | Status: DC
Start: 2018-10-18 — End: 2018-10-21

## 2018-10-18 NOTE — Progress Notes (Signed)
Patient daughter states her therapist at [facility] told her that the patient eyes was shifting from left to right and happened for about 20 minutes. Patient daughter states she has not had any seizures since her last visit. Her daughter states there is no new issues or problems with the patients memory.    .  Chief Complaint   Patient presents with   ??? Follow-up     tremors/memory loss,seizures       .    .  Visit Vitals  BP 120/78 (BP 1 Location: Left arm, BP Patient Position: Sitting)   Pulse 71   Resp 18   Ht 5' 5" (1.651 m)   Wt 145 lb (65.8 kg)   SpO2 100%   BMI 24.13 kg/m??

## 2018-10-18 NOTE — Progress Notes (Signed)
Neurology Progress Note    NAME:  Meghan [name]   DOB:   May 20, 1946   MRN:   [id number]     Date/Time:  10/18/2018  Subjective:     Meghan YNIGUEZ is a 73 y.o. female here today for follow-up for seizure disorder, memory difficulty, neuropathy, pain confusion.  Patient was accompanied by her daughter-[name] to the visit.  She is still in a long-term care-[facility]  She is doing much better, according to her daughter who was with her at today's visit.  Patient does physical therapy 3 times a week but other days she goes to the gym to workout.   Patient continues to be more alert today, communicating much better, she says she has problem with her eyes constantly blurry and unable to see far away.  Patient is referred to ophthalmologist Dr. Clarnce Flock for evaluation and management.  According to daughter, no overt seizure since the last visit, had one episode of abnormal eye movement.  Patient still repeats herself according to daughter but much reduced.  She also has some tremors which improved with medication but started coming back.  I will adjust Requip to 2 mg p.o. nightly.  Patient has been doing much better since Topamax was discontinued according to daughter  Review of Systems - General ROS: positive for  - fatigue and sleep disturbance  Psychological ROS: positive for - anxiety, concentration difficulties, memory difficulties and sleep disturbances  Ophthalmic ROS: positive for - blurry vision and photophobia  ENT ROS: positive for - headaches, tinnitus, vertigo and visual changes  Allergy and Immunology ROS: negative  Hematological and Lymphatic ROS: negative  Endocrine ROS: negative  Respiratory ROS: no cough, shortness of breath, or wheezing  Cardiovascular ROS: no chest pain or dyspnea on exertion  Gastrointestinal ROS: no abdominal pain, change in bowel habits, or black or bloody stools  Genito-Urinary ROS: no dysuria, trouble voiding, or hematuria   Musculoskeletal ROS: positive for - gait disturbance, joint pain, joint stiffness, muscle pain and muscular weakness  Neurological ROS: positive for - dizziness, gait disturbance, headaches, impaired coordination/balance, numbness/tingling, seizures, tremors, visual changes and weakness  Dermatological ROS: negative    Medications reviewed:  Current Outpatient Medications   Medication Sig Dispense Refill   ??? rOPINIRole (REQUIP) 2 mg tablet Take 1 Tab by mouth nightly. 30 Tab 0   ??? folic acid (FOLVITE) 1 mg tablet Take 1 Tab by mouth daily. 30 Tab 3   ??? glucose (DEX4 GLUCOSE) 4 gram chewable tablet Take 4 Tabs by mouth as needed (as needed for hypoglycemia). 8 Tab 0   ??? memantine (NAMENDA) 10 mg tablet TAKE 1 TABLET BY MOUTH TWICE DAILY 60 Tab 0   ??? lacosamide (VIMPAT) 100 mg tab tablet TAKE 1 TABLET BY MOUTH TWICE DAILY 20 Tab 0   ??? donepezil (ARICEPT) 10 mg tablet TAKE 1 TABLET BY MOUTH EVERY NIGHT 30 Tab 0   ??? divalproex DR (DEPAKOTE) 500 mg tablet TAKE 1 TABLET BY MOUTH TWICE DAILY 180 Tab 0   ??? citalopram (CELEXA) 20 mg tablet Take 1 Tab by mouth daily. 90 Tab 3   ??? fenofibrate (LOFIBRA) 54 mg tablet Take 1 Tab by mouth nightly. 90 Tab 3   ??? cholecalciferol (VITAMIN D3) 1,000 unit cap take 1 capsule by mouth once daily 30 Cap 3   ??? fluticasone (FLONASE) 50 mcg/actuation nasal spray 1 Spray by Both Nostrils route daily. 1 Bottle 1   ??? aspirin 81 mg chewable tablet  Take 81 mg by mouth daily.          Objective:   Vitals:  Vitals:    10/18/18 0927   BP: 120/78   Pulse: 71   Resp: 18   SpO2: 100%   Weight: 145 lb (65.8 kg)   Height: 5\' 5"  (1.651 m)   PainSc:   0 - No pain               Lab Data Reviewed:  Lab Results   Component Value Date/Time    WBC 9.7 08/01/2018 06:54 PM    HCT 38.2 08/01/2018 06:54 PM    HGB 12.3 08/01/2018 06:54 PM    PLATELET 508 (H) 08/01/2018 06:54 PM       Lab Results   Component Value Date/Time    Sodium 137 08/01/2018 06:54 PM    Potassium 4.1 08/01/2018 06:54 PM     Chloride 104 08/01/2018 06:54 PM    CO2 27 08/01/2018 06:54 PM    Glucose 103 (H) 08/01/2018 06:54 PM    BUN 14 08/01/2018 06:54 PM    Creatinine 1.10 (H) 08/01/2018 06:54 PM    Calcium 8.6 08/01/2018 06:54 PM       No components found for: TROPQUANT    No results found for: ANA      Lab Results   Component Value Date/Time    Hemoglobin A1c 5.1 07/11/2018 12:12 PM    Hemoglobin A1c (POC) 5.4 01/08/2018 10:09 AM        Lab Results   Component Value Date/Time    Vitamin B12 1,250 (H) 07/12/2018 01:43 PM    Folate 18.0 07/12/2018 01:43 PM       No results found for: ANA, Everlean Alstrom, XBANA    Lab Results   Component Value Date/Time    Cholesterol, total 198 02/25/2018 03:49 AM    Cholesterol (POC) 169 01/08/2018 10:10 AM    HDL Cholesterol 48 02/25/2018 03:49 AM    HDL Cholesterol (POC) 45 01/08/2018 10:10 AM    LDL Cholesterol (POC) 108 09/32/6712 10:10 AM    LDL, calculated 132 (H) 02/25/2018 03:49 AM    VLDL, calculated 18 02/25/2018 03:49 AM    Triglyceride 90 02/25/2018 03:49 AM    Triglycerides (POC) 80 01/08/2018 10:10 AM    CHOL/HDL Ratio 4.1 02/25/2018 03:49 AM         CT Results (recent):      MRI Results (recent):  Results from Hospital Encounter encounter on 07/11/18   MRI BRAIN W WO CONT    Narrative EXAM:  MRI BRAIN W WO CONT    INDICATION:    altered mental status    COMPARISON:  February 24, 2018.    CONTRAST: 15 ml Dotarem.    TECHNIQUE:    Multiplanar multisequence acquisition without and with contrast of the brain.    FINDINGS:  Diffusion imaging does not show acute ischemic changes. There is no extra-axial  fluid collection hemorrhage or shift. Minimal nonspecific white changes.  Flow-voids in major vessels at the base of the brain are present. There is no  mass.  Partially empty sella.  No enhancing lesion or masses.      Impression IMPRESSION: No acute findings no mass. Minimal nonspecific white matter changes.       IR Results (recent):  No results found for this or any previous visit.     VAS/US Results (recent):  Results from Hospital Encounter encounter on 03/22/17   DUPLEX CAROTID BILATERAL  PHYSICAL EXAM:  General:    Alert, cooperative, no distress, appears stated age.     Head:   Normocephalic, without obvious abnormality, atraumatic.  Eyes:   Conjunctivae/corneas clear.  PERRLA  Nose:  Nares normal. No drainage or sinus tenderness.  Throat:    Lips, mucosa, and tongue normal.  No Thrush  Neck:  Supple, symmetrical,  no adenopathy, thyroid: non tender    no carotid bruit and no JVD.  Back:    Symmetric,  No CVA tenderness.  Lungs:   Clear to auscultation bilaterally.  No Wheezing or Rhonchi. No rales.  Chest wall:  No tenderness or deformity. No Accessory muscle use.  Heart:   Regular rate and rhythm,  no murmur, rub or gallop.  Abdomen:   Soft, non-tender. Not distended.  Bowel sounds normal. No masses  Extremities: Extremities normal, atraumatic, No cyanosis.  No edema. No clubbing  Skin:     Texture, turgor normal. No rashes or lesions.  Not Jaundiced  Lymph nodes: Cervical, supraclavicular normal.  Psych:  Good insight.  Not depressed.  Anxious.      NEUROLOGICAL EXAM:  Appearance:  The patient is well developed, well nourished, provides a coherent history and is in no acute distress.   Mental Status: Oriented to time, place and person. Mood and affect appropriate.   Cranial Nerves:   Intact visual fields. Fundi are benign. PERLA, EOM's full, no nystagmus, no ptosis. Facial sensation is normal. Corneal reflexes are intact. Facial movement is symmetric. Hearing is normal bilaterally. Palate is midline with normal sternocleidomastoid and trapezius muscles are normal. Tongue is midline.   Motor:  5-/5 strength in upper extremity and 4+/5 lower extremity proximal and distal muscles. Normal bulk and tone. No fasciculations.   Reflexes:   Deep tendon reflexes 2+/4 and symmetrical.   Sensory:   Normal to touch, pinprick and vibration.   Gait:   Unsteady gait.  Ambulates with a walker    Tremor:   , Tremor noted.   Cerebellar:  No cerebellar signs present.   Neurovascular:  Normal heart sounds and regular rhythm, peripheral pulses intact, and no carotid bruits.       Assesment  1. Partial symptomatic epilepsy with complex partial seizures, not intractable, without status epilepticus (HCC)  Stable  Continue management    2. 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 Gait disorder  Continue physical therapy    4. 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 Parkinsonism, unspecified Parkinsonism type (HCC)  Requip increased to 2 mg p.o. nightly    6. Blurry vision, bilateral    - REFERRAL TO OPHTHALMOLOGY    ___________________________________________________  PLAN:      ICD-10-CM ICD-9-CM    1. Partial symptomatic epilepsy with complex partial seizures, not intractable, without status epilepticus (HCC) G40.209 345.40    2. 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 Gait disorder R26.9 781.2    4. 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 Parkinsonism, unspecified Parkinsonism type (HCC) G20 332.0    6. Blurry vision, bilateral H53.8 368.8 REFERRAL TO OPHTHALMOLOGY     Follow-up and Dispositions    ?? Return in about 3 months (around 01/18/2019).             ___________________________________________________    Attending Physician:  Liv Rallis Glenford Peers, MD

## 2018-10-18 NOTE — Progress Notes (Signed)
Neurology Progress Note    NAME:  Meghan [name]   DOB:   1946-01-18   MRN:   [id number]     Date/Time:  10/18/2018  Subjective:     Meghan [name] is a 73 y.o. female here today for follow-up for seizure disorder, memory difficulty, neuropathy, pain confusion.  Patient was accompanied by her daughter-[name] to the visit.  She is still in a long-term care-[facility]  She is doing much better, according to her daughter who was with her at today's visit.  Patient does physical therapy 3 times a week but other days she goes to the gym to workout.   Patient continues to be more alert today, communicating much better, she says she has problem with her eyes constantly blurry and unable to see far away.  Patient is referred to ophthalmologist Dr. Clarnce Flock for evaluation and management.  According to daughter, no overt seizure since the last visit, had one episode of abnormal eye movement.  Patient still repeats herself according to daughter but much reduced.  She also has some tremors which improved with medication but started coming back.  I will adjust Requip to 2 mg p.o. nightly.  Patient has been doing much better since Topamax was discontinued according to daughter  Review of Systems - General ROS: positive for  - fatigue and sleep disturbance  Psychological ROS: positive for - anxiety, concentration difficulties, memory difficulties and sleep disturbances  Ophthalmic ROS: positive for - blurry vision and photophobia  ENT ROS: positive for - headaches, tinnitus, vertigo and visual changes  Allergy and Immunology ROS: negative  Hematological and Lymphatic ROS: negative  Endocrine ROS: negative  Respiratory ROS: no cough, shortness of breath, or wheezing  Cardiovascular ROS: no chest pain or dyspnea on exertion  Gastrointestinal ROS: no abdominal pain, change in bowel habits, or black or bloody stools  Genito-Urinary ROS: no dysuria, trouble voiding, or hematuria  Musculoskeletal ROS: positive for  - gait disturbance, joint pain, joint stiffness, muscle pain and muscular weakness  Neurological ROS: positive for - dizziness, gait disturbance, headaches, impaired coordination/balance, numbness/tingling, seizures, tremors, visual changes and weakness  Dermatological ROS: negative    Medications reviewed:  Current Outpatient Medications   Medication Sig Dispense Refill   ??? rOPINIRole (REQUIP) 2 mg tablet Take 1 Tab by mouth nightly. 30 Tab 0   ??? folic acid (FOLVITE) 1 mg tablet Take 1 Tab by mouth daily. 30 Tab 3   ??? glucose (DEX4 GLUCOSE) 4 gram chewable tablet Take 4 Tabs by mouth as needed (as needed for hypoglycemia). 8 Tab 0   ??? memantine (NAMENDA) 10 mg tablet TAKE 1 TABLET BY MOUTH TWICE DAILY 60 Tab 0   ??? lacosamide (VIMPAT) 100 mg tab tablet TAKE 1 TABLET BY MOUTH TWICE DAILY 20 Tab 0   ??? donepezil (ARICEPT) 10 mg tablet TAKE 1 TABLET BY MOUTH EVERY NIGHT 30 Tab 0   ??? divalproex DR (DEPAKOTE) 500 mg tablet TAKE 1 TABLET BY MOUTH TWICE DAILY 180 Tab 0   ??? citalopram (CELEXA) 20 mg tablet Take 1 Tab by mouth daily. 90 Tab 3   ??? fenofibrate (LOFIBRA) 54 mg tablet Take 1 Tab by mouth nightly. 90 Tab 3   ??? cholecalciferol (VITAMIN D3) 1,000 unit cap take 1 capsule by mouth once daily 30 Cap 3   ??? fluticasone (FLONASE) 50 mcg/actuation nasal spray 1 Spray by Both Nostrils route daily. 1 Bottle 1   ??? aspirin 81 mg chewable tablet  Take 81 mg by mouth daily.          Objective:   Vitals:  Vitals:    10/18/18 0927   BP: 120/78   Pulse: 71   Resp: 18   SpO2: 100%   Weight: 145 lb (65.8 kg)   Height: 5\' 5"  (1.651 m)   PainSc:   0 - No pain               Lab Data Reviewed:  Lab Results   Component Value Date/Time    WBC 9.7 08/01/2018 06:54 PM    HCT 38.2 08/01/2018 06:54 PM    HGB 12.3 08/01/2018 06:54 PM    PLATELET 508 (H) 08/01/2018 06:54 PM       Lab Results   Component Value Date/Time    Sodium 137 08/01/2018 06:54 PM    Potassium 4.1 08/01/2018 06:54 PM    Chloride 104 08/01/2018 06:54 PM    CO2 27 08/01/2018  06:54 PM    Glucose 103 (H) 08/01/2018 06:54 PM    BUN 14 08/01/2018 06:54 PM    Creatinine 1.10 (H) 08/01/2018 06:54 PM    Calcium 8.6 08/01/2018 06:54 PM       No components found for: TROPQUANT    No results found for: ANA      Lab Results   Component Value Date/Time    Hemoglobin A1c 5.1 07/11/2018 12:12 PM    Hemoglobin A1c (POC) 5.4 01/08/2018 10:09 AM        Lab Results   Component Value Date/Time    Vitamin B12 1,250 (H) 07/12/2018 01:43 PM    Folate 18.0 07/12/2018 01:43 PM       No results found for: ANA, Everlean Alstrom, XBANA    Lab Results   Component Value Date/Time    Cholesterol, total 198 02/25/2018 03:49 AM    Cholesterol (POC) 169 01/08/2018 10:10 AM    HDL Cholesterol 48 02/25/2018 03:49 AM    HDL Cholesterol (POC) 45 01/08/2018 10:10 AM    LDL Cholesterol (POC) 108 73/71/0626 10:10 AM    LDL, calculated 132 (H) 02/25/2018 03:49 AM    VLDL, calculated 18 02/25/2018 03:49 AM    Triglyceride 90 02/25/2018 03:49 AM    Triglycerides (POC) 80 01/08/2018 10:10 AM    CHOL/HDL Ratio 4.1 02/25/2018 03:49 AM         CT Results (recent):      MRI Results (recent):  Results from Hospital Encounter encounter on 07/11/18   MRI BRAIN W WO CONT    Narrative EXAM:  MRI BRAIN W WO CONT    INDICATION:    altered mental status    COMPARISON:  February 24, 2018.    CONTRAST: 15 ml Dotarem.    TECHNIQUE:    Multiplanar multisequence acquisition without and with contrast of the brain.    FINDINGS:  Diffusion imaging does not show acute ischemic changes. There is no extra-axial  fluid collection hemorrhage or shift. Minimal nonspecific white changes.  Flow-voids in major vessels at the base of the brain are present. There is no  mass.  Partially empty sella.  No enhancing lesion or masses.      Impression IMPRESSION: No acute findings no mass. Minimal nonspecific white matter changes.       IR Results (recent):  No results found for this or any previous visit.    VAS/US Results (recent):  Results from Hospital Encounter  encounter on 03/22/17   DUPLEX CAROTID BILATERAL  PHYSICAL EXAM:  General:    Alert, cooperative, no distress, appears stated age.     Head:   Normocephalic, without obvious abnormality, atraumatic.  Eyes:   Conjunctivae/corneas clear.  PERRLA  Nose:  Nares normal. No drainage or sinus tenderness.  Throat:    Lips, mucosa, and tongue normal.  No Thrush  Neck:  Supple, symmetrical,  no adenopathy, thyroid: non tender    no carotid bruit and no JVD.  Back:    Symmetric,  No CVA tenderness.  Lungs:   Clear to auscultation bilaterally.  No Wheezing or Rhonchi. No rales.  Chest wall:  No tenderness or deformity. No Accessory muscle use.  Heart:   Regular rate and rhythm,  no murmur, rub or gallop.  Abdomen:   Soft, non-tender. Not distended.  Bowel sounds normal. No masses  Extremities: Extremities normal, atraumatic, No cyanosis.  No edema. No clubbing  Skin:     Texture, turgor normal. No rashes or lesions.  Not Jaundiced  Lymph nodes: Cervical, supraclavicular normal.  Psych:  Good insight.  Not depressed.  Anxious.      NEUROLOGICAL EXAM:  Appearance:  The patient is well developed, well nourished, provides a coherent history and is in no acute distress.   Mental Status: Oriented to time, place and person. Mood and affect appropriate.   Cranial Nerves:   Intact visual fields. Fundi are benign. PERLA, EOM's full, no nystagmus, no ptosis. Facial sensation is normal. Corneal reflexes are intact. Facial movement is symmetric. Hearing is normal bilaterally. Palate is midline with normal sternocleidomastoid and trapezius muscles are normal. Tongue is midline.   Motor:  5-/5 strength in upper extremity and 4+/5 lower extremity proximal and distal muscles. Normal bulk and tone. No fasciculations.   Reflexes:   Deep tendon reflexes 2+/4 and symmetrical.   Sensory:   Normal to touch, pinprick and vibration.   Gait:   Unsteady gait.  Ambulates with a walker   Tremor:   , Tremor noted.   Cerebellar:  No cerebellar signs  present.   Neurovascular:  Normal heart sounds and regular rhythm, peripheral pulses intact, and no carotid bruits.       Assesment  1. Partial symptomatic epilepsy with complex partial seizures, not intractable, without status epilepticus (HCC)  Stable  Continue management    2. 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 Gait disorder  Continue physical therapy    4. 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 Parkinsonism, unspecified Parkinsonism type (HCC)  Requip increased to 2 mg p.o. nightly    6. Blurry vision, bilateral    - REFERRAL TO OPHTHALMOLOGY    ___________________________________________________  PLAN:      ICD-10-CM ICD-9-CM    1. Partial symptomatic epilepsy with complex partial seizures, not intractable, without status epilepticus (HCC) G40.209 345.40    2. 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 Gait disorder R26.9 781.2    4. 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 Parkinsonism, unspecified Parkinsonism type (HCC) G20 332.0    6. Blurry vision, bilateral H53.8 368.8 REFERRAL TO OPHTHALMOLOGY     Follow-up and Dispositions    ?? Return in about 3 months (around 01/18/2019).             ___________________________________________________    Attending Physician:  Liv Rallis Glenford Peers, MD

## 2018-10-18 NOTE — Progress Notes (Signed)
 Patient daughter states her therapist at [facility] told her that the patient eyes was shifting from left to right and happened for about 20 minutes. Patient daughter states she has not had any seizures since her last visit. Her daughter states there is no new issues or problems with the patients memory.    .  Chief Complaint   Patient presents with   . Follow-up     tremors/memory loss,seizures       .    Meghan [name]  Visit Vitals  BP 120/78 (BP 1 Location: Left arm, BP Patient Position: Sitting)   Pulse 71   Resp 18   Ht 5' 5 (1.651 m)   Wt 145 lb (65.8 kg)   SpO2 100%   BMI 24.13 kg/m

## 2018-10-21 MED ORDER — ROPINIROLE 2 MG TAB
2 mg | ORAL_TABLET | Freq: Every evening | ORAL | 3 refills | Status: AC
Start: 2018-10-21 — End: ?

## 2018-10-21 NOTE — Telephone Encounter (Signed)
[facility] will not accept the prescription that was faxed due to additional refills cannot be read. Please resend.

## 2019-01-17 ENCOUNTER — Encounter: Attending: Neurology | Primary: Internal Medicine

## 2019-02-02 ENCOUNTER — Inpatient Hospital Stay
Admit: 2019-02-02 | Discharge: 2019-02-03 | Disposition: A | Discharge status: Home / Self Care | Payer: MEDICARE | Attending: Emergency Medicine

## 2019-02-02 DIAGNOSIS — R569 Unspecified convulsions: Secondary | ICD-10-CM

## 2019-02-02 LAB — CBC WITH AUTOMATED DIFF
ABS. BASOPHILS: 0 10*3/uL (ref 0.0–0.1)
ABS. EOSINOPHILS: 0.1 10*3/uL (ref 0.0–0.4)
ABS. IMM. GRANS.: 0 10*3/uL (ref 0.00–0.04)
ABS. LYMPHOCYTES: 1.6 10*3/uL (ref 0.8–3.5)
ABS. MONOCYTES: 0.5 10*3/uL (ref 0.0–1.0)
ABS. NEUTROPHILS: 2.5 10*3/uL (ref 1.8–8.0)
ABSOLUTE NRBC: 0 10*3/uL (ref 0.00–0.01)
BASOPHILS: 1 % (ref 0–1)
EOSINOPHILS: 3 % (ref 0–7)
HCT: 37.6 % (ref 35.0–47.0)
HGB: 12.4 g/dL (ref 11.5–16.0)
IMMATURE GRANULOCYTES: 0 % (ref 0.0–0.5)
LYMPHOCYTES: 33 % (ref 12–49)
MCH: 32.1 PG (ref 26.0–34.0)
MCHC: 33 g/dL (ref 30.0–36.5)
MCV: 97.4 FL (ref 80.0–99.0)
MONOCYTES: 11 % (ref 5–13)
MPV: 10.4 FL (ref 8.9–12.9)
NEUTROPHILS: 52 % (ref 32–75)
NRBC: 0 PER 100 WBC
PLATELET: 504 10*3/uL — ABNORMAL HIGH (ref 150–400)
RBC: 3.86 M/uL (ref 3.80–5.20)
RDW: 15.3 % — ABNORMAL HIGH (ref 11.5–14.5)
WBC: 4.8 10*3/uL (ref 3.6–11.0)

## 2019-02-02 LAB — URINALYSIS W/ REFLEX CULTURE
BACTERIA, URINE: NEGATIVE /hpf
Bacteria: NEGATIVE /hpf
Bilirubin, Urine: NEGATIVE
Bilirubin: NEGATIVE
Blood, Urine: NEGATIVE
Blood: NEGATIVE
Glucose, Ur: NEGATIVE mg/dL
Glucose: NEGATIVE mg/dL
Ketone: NEGATIVE mg/dL
Ketones, Urine: NEGATIVE mg/dL
Leukocyte Esterase, Urine: NEGATIVE
Leukocyte Esterase: NEGATIVE
Nitrite, Urine: NEGATIVE
Nitrites: NEGATIVE
Protein, UA: NEGATIVE mg/dL
Protein: NEGATIVE mg/dL
Specific Gravity, UA: 1.02 (ref 1.003–1.030)
Specific gravity: 1.02 (ref 1.003–1.030)
Urobilinogen, UA, POCT: 1 EU/dL (ref 0.2–1.0)
Urobilinogen: 1 EU/dL (ref 0.2–1.0)
pH (UA): 7 (ref 5.0–8.0)
pH, UA: 7 (ref 5.0–8.0)

## 2019-02-02 LAB — METABOLIC PANEL, COMPREHENSIVE
A-G Ratio: 0.9 — ABNORMAL LOW (ref 1.1–2.2)
ALT (SGPT): 15 U/L (ref 12–78)
AST (SGOT): 24 U/L (ref 15–37)
Albumin: 3.4 g/dL — ABNORMAL LOW (ref 3.5–5.0)
Alk. phosphatase: 54 U/L (ref 45–117)
Anion gap: 2 mmol/L — ABNORMAL LOW (ref 5–15)
BUN/Creatinine ratio: 18 (ref 12–20)
BUN: 19 MG/DL (ref 6–20)
Bilirubin, total: 0.3 MG/DL (ref 0.2–1.0)
CO2: 31 mmol/L (ref 21–32)
Calcium: 9 MG/DL (ref 8.5–10.1)
Chloride: 100 mmol/L (ref 97–108)
Creatinine: 1.03 MG/DL — ABNORMAL HIGH (ref 0.55–1.02)
GFR est AA: >60 mL/min/{1.73_m2} (ref >60)
GFR est non-AA: 53 mL/min/{1.73_m2} — ABNORMAL LOW (ref >60)
Globulin: 3.9 g/dL (ref 2.0–4.0)
Glucose: 78 mg/dL (ref 65–100)
Potassium: 4.8 mmol/L (ref 3.5–5.1)
Protein, total: 7.3 g/dL (ref 6.4–8.2)
Sodium: 133 mmol/L — ABNORMAL LOW (ref 136–145)

## 2019-02-02 LAB — VALPROIC ACID
Valproic Acid: 47 ug/ml — ABNORMAL LOW (ref 50–100)
Valproic acid: 47 ug/ml — ABNORMAL LOW (ref 50–100)

## 2019-02-02 LAB — COMPREHENSIVE METABOLIC PANEL
ALT: 15 U/L (ref 12–78)
AST: 24 U/L (ref 15–37)
Albumin/Globulin Ratio: 0.9 — ABNORMAL LOW (ref 1.1–2.2)
Albumin: 3.4 g/dL — ABNORMAL LOW (ref 3.5–5.0)
Alkaline Phosphatase: 54 U/L (ref 45–117)
Anion Gap: 2 mmol/L — ABNORMAL LOW (ref 5–15)
BUN: 19 MG/DL (ref 6–20)
Bun/Cre Ratio: 18 (ref 12–20)
CO2: 31 mmol/L (ref 21–32)
Calcium: 9 MG/DL (ref 8.5–10.1)
Chloride: 100 mmol/L (ref 97–108)
Creatinine: 1.03 MG/DL — ABNORMAL HIGH (ref 0.55–1.02)
EGFR IF NonAfrican American: 53 mL/min/{1.73_m2} — ABNORMAL LOW (ref >60)
GFR African American: >60 mL/min/{1.73_m2} (ref >60)
Globulin: 3.9 g/dL (ref 2.0–4.0)
Glucose: 78 mg/dL (ref 65–100)
Potassium: 4.8 mmol/L (ref 3.5–5.1)
Sodium: 133 mmol/L — ABNORMAL LOW (ref 136–145)
Total Bilirubin: 0.3 MG/DL (ref 0.2–1.0)
Total Protein: 7.3 g/dL (ref 6.4–8.2)

## 2019-02-02 LAB — CBC WITH AUTO DIFFERENTIAL
Basophils %: 1 % (ref 0–1)
Basophils Absolute: 0 10*3/uL (ref 0.0–0.1)
Eosinophils %: 3 % (ref 0–7)
Eosinophils Absolute: 0.1 10*3/uL (ref 0.0–0.4)
Granulocyte Absolute Count: 0 10*3/uL (ref 0.00–0.04)
Hematocrit: 37.6 % (ref 35.0–47.0)
Hemoglobin: 12.4 g/dL (ref 11.5–16.0)
Immature Granulocytes: 0 % (ref 0.0–0.5)
Lymphocytes %: 33 % (ref 12–49)
Lymphocytes Absolute: 1.6 10*3/uL (ref 0.8–3.5)
MCH: 32.1 PG (ref 26.0–34.0)
MCHC: 33 g/dL (ref 30.0–36.5)
MCV: 97.4 FL (ref 80.0–99.0)
MPV: 10.4 FL (ref 8.9–12.9)
Monocytes %: 11 % (ref 5–13)
Monocytes Absolute: 0.5 10*3/uL (ref 0.0–1.0)
NRBC Absolute: 0 10*3/uL (ref 0.00–0.01)
Neutrophils %: 52 % (ref 32–75)
Neutrophils Absolute: 2.5 10*3/uL (ref 1.8–8.0)
Nucleated RBCs: 0 PER 100 WBC
Platelets: 504 10*3/uL — ABNORMAL HIGH (ref 150–400)
RBC: 3.86 M/uL (ref 3.80–5.20)
RDW: 15.3 % — ABNORMAL HIGH (ref 11.5–14.5)
WBC: 4.8 10*3/uL (ref 3.6–11.0)

## 2019-02-02 MED ORDER — SODIUM CHLORIDE 0.9 % IJ SYRG
Freq: Three times a day (TID) | INTRAMUSCULAR | Status: DC
Start: 2019-02-02 — End: 2019-02-03
  Administered 2019-02-02: 20:00:00 via INTRAVENOUS

## 2019-02-02 MED ORDER — SODIUM CHLORIDE 0.9 % IV
5005100 mg/5 mL (100 mg/mL) | INTRAVENOUS | Status: AC
Start: 2019-02-02 — End: 2019-02-02
  Administered 2019-02-02: 22:00:00 via INTRAVENOUS

## 2019-02-02 MED ORDER — SODIUM CHLORIDE 0.9 % IJ SYRG
INTRAMUSCULAR | Status: DC | PRN
Start: 2019-02-02 — End: 2019-02-03

## 2019-02-02 MED FILL — VALPROATE SODIUM 100 MG/ML IV: 500 mg/5 mL (100 mg/mL) | INTRAVENOUS | Qty: 5

## 2019-02-02 NOTE — ED Notes (Signed)
Bedside and Verbal shift change report given to Denise P, RN & Chante H., RN (oncoming nurse) by this RN (offgoing nurse). Report included the following information SBAR.

## 2019-02-02 NOTE — ED Notes (Signed)
Pt experienced what appeared to be, a 10 second seizure without a postictal period (facial twitching and arm movements). No distress noted. MD aware. Seizure medication given.
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
## 2019-02-02 NOTE — ED Notes (Signed)
TRANSFER - OUT REPORT:    Verbal report given to [place] nurse at SNF (name) on Meghan [name]  being transferred to [facility] and rehab (unit) for routine progression of care       Report consisted of patient???s Situation, Background, Assessment and   Recommendations(SBAR).     Information from the following report(s) SBAR and ED Summary was reviewed with the receiving nurse.    Lines:   Peripheral IV 02/02/19 Left Forearm (Active)   Site Assessment Clean, dry, & intact 02/02/2019  6:42 PM   Phlebitis Assessment 0 02/02/2019  6:42 PM   Infiltration Assessment 0 02/02/2019  6:42 PM   Dressing Status Clean, dry, & intact 02/02/2019  6:42 PM        Opportunity for questions and clarification was provided.      Patient transported with:   [facility]

## 2019-02-02 NOTE — ED Notes (Signed)
Pt is a difficult stick - multiple attempts for lab draw. She is currently sitting up in bed eating. Skin warm and dry. Respirations even and unlabored. In no apparent distress at this time.

## 2019-02-02 NOTE — ED Notes (Signed)
Pt experienced what appeared to be, a 10 second seizure without a postictal period (facial twitching and arm movements). No distress noted. MD aware. Seizure medication given.

## 2019-02-02 NOTE — ED Notes (Signed)
 TRANSFER - OUT REPORT:    Verbal report given to [place] nurse at [facility] (name) on Meghan [name]  being transferred to [facility] and rehab (unit) for routine progression of care       Report consisted of patient's Situation, Background, Assessment and   Recommendations(SBAR).     Information from the following report(s) SBAR and ED Summary was reviewed with the receiving nurse.    Lines:   Peripheral IV 02/02/19 Left Forearm (Active)   Site Assessment Clean, dry, & intact 02/02/2019  6:42 PM   Phlebitis Assessment 0 02/02/2019  6:42 PM   Infiltration Assessment 0 02/02/2019  6:42 PM   Dressing Status Clean, dry, & intact 02/02/2019  6:42 PM        Opportunity for questions and clarification was provided.      Patient transported with:   [facility]
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
## 2019-02-02 NOTE — ED Notes (Signed)
Pt is a difficult stick - multiple attempts for lab draw. She is currently sitting up in bed eating. Skin warm and dry. Respirations even and unlabored. In no apparent distress at this time.

## 2019-05-28 ENCOUNTER — Telehealth: Admit: 2019-05-28 | Payer: MEDICARE | Attending: Internal Medicine | Primary: Internal Medicine

## 2019-05-28 NOTE — Progress Notes (Signed)
Not seen

## 2019-05-28 NOTE — Progress Notes (Deleted)
HISTORY OF PRESENT ILLNESS  Meghan FuchsBarbara J [name] is a 73 y.o. female.  HPI     Pt is here to establish care.  This is an established visit completed with telemedicine was completed with video assist  the patient acknowledges and agrees to this method of visitation doxyme      BP is     Wt was 143 lbs lov    Ordered labs     She follows with Dr. Priscella MannArula (neuro)   Reviewed note 10/18/18: 1. Partial symptomatic epilepsy with complex partial seizures, not intractable, without status epilepticus (HCC)  Stable  Continue management  2. Vascular dementia without behavioral disturbance (HCC)  Stable  3. Gait disorder  Continue physical therapy  4. 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 Parkinsonism, unspecified Parkinsonism type (HCC)  Requip increased to 2 mg p.o. nightly    Meds: requip, namenda, aricept, depakote, celexa, fenofibrate,     PMHx: htn,  diabetes,  HLD, altered mental state, dementia    FMHx:    PSHx:    SHx:    PREVENTIVE:  Colonoscopy:  Dexa:   Mammo: 6/19 neg  Pap:  Tdap:  Pneumovax:  Shingrix:  Flu shot:  Eye exam:  Lipids: 7/19 132  A1c: 11/19 5.1      Patient Active Problem List    Diagnosis Date Noted   ??? Hypoglycemia 07/11/2018   ??? HLD (hyperlipidemia) 05/23/2018   ??? 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 Dementia without behavioral disturbance (HCC) 05/22/2018   ??? Acute renal insufficiency 05/22/2018   ??? Recurrent seizures (HCC) 05/18/2018   ??? Aphasia 02/23/2018   ??? Type 2 diabetes mellitus with diabetic neuropathy (HCC) 01/08/2018   ??? 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 Encephalopathy acute 07/15/2017   ??? Post-ictal coma (HCC) 07/14/2017   ??? Acute encephalopathy 07/14/2017   ??? AKI (acute kidney injury) (HCC) 06/19/2017   ??? 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 Altered mental state 06/18/2017   ??? HTN (hypertension) 06/18/2017    ??? 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 DM w/o complication type II (HCC) 03/26/2017   ??? Acute cystitis 03/26/2017   ??? Syncope 03/23/2017   ??? 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 Convulsive syncope 03/23/2017   ??? Bilateral carotid artery stenosis 03/23/2017   ??? Rhabdomyolysis 03/23/2017   ??? Cellulitis of arm 06/03/2014   ??? Seizure (HCC) 05/30/2014     Current Outpatient Medications   Medication Sig Dispense Refill   ??? rOPINIRole (REQUIP) 2 mg tablet Take 1 Tab by mouth nightly. 30 Tab 3   ??? folic acid (FOLVITE) 1 mg tablet Take 1 Tab by mouth daily. 30 Tab 3   ??? glucose (DEX4 GLUCOSE) 4 gram chewable tablet Take 4 Tabs by mouth as needed (as needed for hypoglycemia). 8 Tab 0   ??? memantine (NAMENDA) 10 mg tablet TAKE 1 TABLET BY MOUTH TWICE DAILY 60 Tab 0   ??? lacosamide (VIMPAT) 100 mg tab tablet TAKE 1 TABLET BY MOUTH TWICE DAILY 20 Tab 0   ??? donepezil (ARICEPT) 10 mg tablet TAKE 1 TABLET BY MOUTH EVERY NIGHT 30 Tab 0   ??? divalproex DR (DEPAKOTE) 500 mg tablet TAKE 1 TABLET BY MOUTH TWICE DAILY 180 Tab 0   ??? citalopram (CELEXA) 20 mg tablet Take 1 Tab by mouth daily. 90 Tab  3   ??? fenofibrate (LOFIBRA) 54 mg tablet Take 1 Tab by mouth nightly. 90 Tab 3   ??? cholecalciferol (VITAMIN D3) 1,000 unit cap take 1 capsule by mouth once daily 30 Cap 3   ??? fluticasone (FLONASE) 50 mcg/actuation nasal spray 1 Spray by Both Nostrils route daily. 1 Bottle 1   ??? aspirin 81 mg chewable tablet Take 81 mg by mouth daily.       Past Surgical History:   Procedure Laterality Date   ??? COLONOSCOPY [name] 02/12/2018    COLONOSCOPY performed by Wilfred Curtis, MD at [facility] ENDOSCOPY   ??? HX GYN      hysterectomy      Lab Results   Component Value Date/Time    WBC 4.8 02/02/2019 04:30 PM    HGB 12.4 02/02/2019 04:30 PM    HCT 37.6 02/02/2019 04:30 PM    PLATELET 504 (H) 02/02/2019 04:30 PM    MCV 97.4 02/02/2019 04:30 PM     Lab Results   Component Value Date/Time     Cholesterol, total 198 02/25/2018 03:49 AM    Cholesterol (POC) 169 01/08/2018 10:10 AM    HDL Cholesterol 48 02/25/2018 03:49 AM    LDL, calculated 132 (H) 02/25/2018 03:49 AM    LDL Cholesterol (POC) 108 83/66/2947 10:10 AM    Triglyceride 90 02/25/2018 03:49 AM    Triglycerides (POC) 80 01/08/2018 10:10 AM    CHOL/HDL Ratio 4.1 02/25/2018 03:49 AM     Lab Results   Component Value Date/Time    GFR est non-AA 53 (L) 02/02/2019 04:30 PM    GFR est AA >60 02/02/2019 04:30 PM    Creatinine 1.03 (H) 02/02/2019 04:30 PM    BUN 19 02/02/2019 04:30 PM    Sodium 133 (L) 02/02/2019 04:30 PM    Potassium 4.8 02/02/2019 04:30 PM    Chloride 100 02/02/2019 04:30 PM    CO2 31 02/02/2019 04:30 PM    Magnesium 1.8 07/14/2018 01:56 AM    Phosphorus 2.1 (L) 07/14/2018 01:56 AM        Review of Systems   Respiratory: Negative for shortness of breath.    Cardiovascular: Negative for chest pain.       Physical Exam  Constitutional:       General: She is not in acute distress.     Appearance: Normal appearance. She is not ill-appearing, toxic-appearing or diaphoretic.   HENT:      Head: Normocephalic and atraumatic.   Eyes:      General:         Right eye: No discharge.         Left eye: No discharge.      Conjunctiva/sclera: Conjunctivae normal.   Pulmonary:      Effort: No respiratory distress.   Neurological:      Mental Status: She is alert and oriented to person, place, and time. Mental status is at baseline.      Gait: Gait normal.   Psychiatric:         Mood and Affect: Mood normal.         Behavior: Behavior normal.         ASSESSMENT and PLAN  {No Diagnosis Found}        Scribed by Jacqulyn Bath of [employer], as dictated by Dr. Roney Mans.     Current diagnosis and concerns discussed with pt at length. Pt understands risks and benefits or current treatment plan and medications, and accepts the treatment  and medication with any possible risks. Pt asks appropriate  questions, which were answered. Pt was instructed to call with any concerns or problems.      I have reviewed the note documented by the scribe.  The services provided are my own.  The documentation is accurate     [name], who was evaluated through a synchronous (real-time) audio-video encounter, and/or her healthcare decision maker, is aware that it is a billable service, with coverage as determined by her insurance carrier. She provided verbal consent to proceed: Yes, and patient identification was verified. It was conducted pursuant to the emergency declaration under the [place] waiver authority and the [employer] Act. A caregiver was present when appropriate. Ability to conduct physical exam was limited. I was in the office. The patient was at home.

## 2019-07-15 ENCOUNTER — Telehealth: Payer: Self-pay | Admitting: Cardiology

## 2019-07-28 ENCOUNTER — Ambulatory Visit: Payer: Self-pay | Admitting: Cardiology

## 2019-08-18 ENCOUNTER — Ambulatory Visit: Payer: Self-pay | Admitting: Cardiology

## 2019-08-18 ENCOUNTER — Other Ambulatory Visit: Payer: Self-pay

## 2019-08-18 ENCOUNTER — Ambulatory Visit: Payer: Medicare PPO | Admitting: Cardiology

## 2019-08-18 ENCOUNTER — Encounter: Payer: Self-pay | Admitting: Cardiology

## 2019-08-18 VITALS — BP 150/84 | HR 86 | Temp 97.9°F | Ht 64.0 in | Wt 262.2 lb

## 2019-08-18 DIAGNOSIS — E782 Mixed hyperlipidemia: Secondary | ICD-10-CM

## 2019-08-18 DIAGNOSIS — N1832 Chronic kidney disease, stage 3b: Secondary | ICD-10-CM | POA: Diagnosis not present

## 2019-08-18 DIAGNOSIS — I129 Hypertensive chronic kidney disease with stage 1 through stage 4 chronic kidney disease, or unspecified chronic kidney disease: Secondary | ICD-10-CM | POA: Diagnosis not present

## 2019-08-18 DIAGNOSIS — R0609 Other forms of dyspnea: Secondary | ICD-10-CM

## 2019-08-18 DIAGNOSIS — E119 Type 2 diabetes mellitus without complications: Secondary | ICD-10-CM | POA: Diagnosis not present

## 2019-08-18 DIAGNOSIS — I10 Essential (primary) hypertension: Secondary | ICD-10-CM

## 2019-08-18 MED ORDER — METOPROLOL TARTRATE 25 MG PO TABS: 25.0000 mg | ORAL_TABLET | Freq: Two times a day (BID) | ORAL | 2 refills | Status: DC

## 2019-08-18 NOTE — Progress Notes (Signed)
Primary Physician/Referring:  Prince Solian, MD  Patient ID: Laqueta Carina, female    DOB: 02-28-1946, 74 y.o.   MRN: [id number]  Chief Complaint  Patient presents with  . New Patient (Initial Visit)  . Shortness of Breath   HPI:    EMILY MASSAR  is a 74 y.o. Caucasian female with hypertension, hyperlipidemia, diet-controlled diabetes mellitus with stage III chronic kidney disease, morbid obesity and obstructive sleep apnea on CPAP referred to me for evaluation of shortness of breath and also performed diaphoresis associated with post exercise recovery that she started noticing over the past 4-5 months.  Patient states that she exercises regularly and did not have these symptoms previously.  No chest pain, no palpitations, no dizziness or syncope.  Past Medical History:  Diagnosis Date  . Gout   . HTN (hypertension)   . OSA (obstructive sleep apnea) 07/15/2013  . Reflux   . Sinus infection    Past Surgical History:  Procedure Laterality Date  . COLONOSCOPY    . COLONOSCOPY [name] 08/31/2014   Procedure: COLONOSCOPY;  Surgeon: Daneil Dolin, MD;  Location: [facility];  Service: Endoscopy;  Laterality: [name];  8:30 AM  . Fybroid emoval     Social History   Socioeconomic History  . Marital status: Single    Spouse name: Not on file  . Number of children: 1  . Years of education: Not on file  . Highest education level: Not on file  Occupational History  . Not on file  Tobacco Use  . Smoking status: Former Smoker    Packs/day: 0.50    Types: Cigarettes    Quit date: 07/15/1989    Years since quitting: 30.1  . Smokeless tobacco: Never Used  Substance and Sexual Activity  . Alcohol use: No  . Drug use: No  . Sexual activity: Not on file  Other Topics Concern  . 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  Retired.     Social Determinants of Health   Financial Resource Strain:   . Difficulty of  Paying Living Expenses: Not on file  Food Insecurity:   . Worried About Charity fundraiser in the Last Year: Not on file  . Ran Out of Food in the Last Year: Not on file  Transportation Needs:   . Lack of Transportation (Medical): Not on file  . Lack of Transportation (Non-Medical): Not on file  Physical Activity:   . Days of Exercise per Week: Not on file  . Minutes of Exercise per Session: Not on file  Stress:   . Feeling of Stress : Not on file  Social Connections:   . Frequency of Communication with Friends and Family: Not on file  . Frequency of Social Gatherings with Friends and Family: Not on file  . Attends Religious Services: Not on file  . Active Member of Clubs or Organizations: Not on file  . Attends Archivist Meetings: Not on file  . Marital Status: Not on file  Intimate Partner Violence:   . Fear of Current or Ex-Partner: Not on file  . Emotionally Abused: Not on file  . Physically Abused: Not on file  . Sexually Abused: Not on file   ROS  Review of Systems  Cardiovascular: Positive for dyspnea on exertion.  Musculoskeletal: Positive for joint pain.   Objective  Blood pressure (!) 150/84, pulse 86, temperature 97.9 F (36.6  C), height '5\' 4"'  (1.626 m), weight 262 lb 3.2 oz (118.9 kg), SpO2 97 %.  Vitals with BMI 08/18/2019 07/10/2017 07/10/2017  Height '5\' 4"'  - '5\' 5"'   Weight 262 lbs 3 oz - 258 lbs  BMI 00.17 - 49.44  Systolic 967 591 -  Diastolic 84 91 -  Pulse 86 76 -     Physical Exam  Constitutional: She appears well-developed. No distress.  Morbidly obese  HENT:  Head: Atraumatic.  Eyes: Conjunctivae are normal.  Neck: No thyromegaly present.  Short neck and difficult to evaluate JVP  Cardiovascular: Normal rate, regular rhythm and normal heart sounds. Exam reveals no gallop.  No murmur heard. Pulses:      Carotid pulses are 2+ on the right side and 2+ on the left side.      Dorsalis pedis pulses are 2+ on the right side and 2+ on the  left side.       Posterior tibial pulses are 2+ on the right side and 2+ on the left side.  Femoral and popliteal pulse difficult to feel due to patient's body habitus.   Pulmonary/Chest: Effort normal and breath sounds normal.  Abdominal: Soft. Bowel sounds are normal.  Obese. Pannus present  Musculoskeletal:        General: No edema. Normal range of motion.     Cervical back: Neck supple.  Neurological: She is alert.  Skin: Skin is warm and dry.  Psychiatric: She has a normal mood and affect.   Laboratory examination:   No results for input(s): NA, K, CL, CO2, GLUCOSE, BUN, CREATININE, CALCIUM, GFRNONAA, GFRAA in the last 8760 hours. CrCl cannot be calculated (No successful lab value found.).  No flowsheet data found. No flowsheet data found. Lipid Panel  No results found for: CHOL, TRIG, HDL, CHOLHDL, VLDL, LDLCALC, LDLDIRECT HEMOGLOBIN A1C No results found for: HGBA1C, MPG TSH No results for input(s): TSH in the last 8760 hours.  Labs 05/29/2019: A1c 6.4%, serum glucose 13 mg, BUN 14, creatinine 1.1, eGFR 49 ML, potassium 4.2. Labs 10/15/2018: Total cholesterol 194, triglycerides 177, HDL 46, LDL 113.  Medications and allergies  No Known Allergies   Current Outpatient Medications  Medication Instructions  . allopurinol (ZYLOPRIM) 300 mg, Oral, Daily  . amLODipine (NORVASC) 5 mg, Oral, Daily  . aspirin EC 81 mg, Oral, Daily  . brimonidine (ALPHAGAN) 0.2 % ophthalmic solution 1 drop, Right Eye, As needed  . Calcium Carbonate-Vitamin D (CALTRATE 600+D PO) 1 tablet, Oral, Daily  . fluticasone (FLONASE) 50 MCG/ACT nasal spray 1-2 sprays, Each Nare, As needed  . ipratropium (ATROVENT) 0.06 % nasal spray SMARTSIG:2 Spray(s) Both Nares 3 Times Daily PRN  . irbesartan (AVAPRO) 300 MG tablet 1 tablet, Daily  . metoprolol tartrate (LOPRESSOR) 25 mg, Oral, 2 times daily  . omeprazole (PRILOSEC) 20 mg, Oral, Daily PRN, Takes only as needed    Radiology:  No results  found.  Cardiac Studies:   None  Assessment     ICD-10-CM   1. DOE (dyspnea on exertion)  R06.00 EKG 12-Lead    metoprolol tartrate (LOPRESSOR) 25 MG tablet    PCV ECHOCARDIOGRAM COMPLETE    PCV MYOCARDIAL PERFUSION WITH LEXISCAN  2. Primary hypertension  I10 metoprolol tartrate (LOPRESSOR) 25 MG tablet  3. Stage 3b chronic kidney disease  N18.32   4. Diet-controlled diabetes mellitus (HCC)  E11.9 PCV ECHOCARDIOGRAM COMPLETE    PCV MYOCARDIAL PERFUSION WITH LEXISCAN  5. Mixed hyperlipidemia  E78.2     EKG 08/18/2019:  Normal sinus rhythm at rate of 80 bpm, normal axis.  PVCs (2).  Interpolated.  No evidence of ischemia, normal QT interval.   Recommendations:   Meds ordered this encounter  Medications  . metoprolol tartrate (LOPRESSOR) 25 MG tablet    Sig: Take 1 tablet (25 mg total) by mouth 2 (two) times daily.    Dispense:  60 tablet    Refill:  2    RONNISHA FELBER  is a 74 y.o. Caucasian female with hypertension, hyperlipidemia, diet-controlled diabetes mellitus with stage III chronic kidney disease, morbid obesity and obstructive sleep apnea on CPAP referred to me for evaluation of shortness of breath and also performed diaphoresis associated with post exercise recovery that she started noticing over the past 4-5 months  she has multiple cardiovascular risk factors that includes diet-controlled diabetes with stage III chronic kidney disease, mixed hyperlipidemia and hypertension and morbid obesity.  Schedule for a Lexiscan Sestamibi stress test to evaluate for myocardial ischemia. Patient unable to do treadmill stress testing due to Covid 19 to reduce aerosol spread, Will schedule for an echocardiogram. I'll start her on metoprolol tartrate 25 mg p.o. b.i.d. for hypertension and also frequent PVCs that she's having during examination and also on the EKG.  I'd like to see her back in 6 weeks for follow-up. Will consider adding statin in view of her risk factors. Previously  lipids were at goal in 2017 and 2019.   Weight loss was extensively discussed with the patient.  She appears to be motivated.  Adrian Prows, MD, [facility] 08/18/2019, 11:03 PM [facility] [place] Pager: [phone] Office: [phone]

## 2019-08-19 ENCOUNTER — Other Ambulatory Visit: Payer: Self-pay

## 2019-08-19 DIAGNOSIS — I10 Essential (primary) hypertension: Secondary | ICD-10-CM

## 2019-08-19 DIAGNOSIS — R0609 Other forms of dyspnea: Secondary | ICD-10-CM

## 2019-08-19 MED ORDER — METOPROLOL TARTRATE 25 MG PO TABS: 25.0000 mg | ORAL_TABLET | Freq: Two times a day (BID) | ORAL | 2 refills | Status: AC

## 2019-08-25 ENCOUNTER — Ambulatory Visit (INDEPENDENT_AMBULATORY_CARE_PROVIDER_SITE_OTHER): Payer: Medicare PPO

## 2019-08-25 ENCOUNTER — Other Ambulatory Visit: Payer: Self-pay

## 2019-08-25 DIAGNOSIS — R0609 Other forms of dyspnea: Secondary | ICD-10-CM

## 2019-08-25 DIAGNOSIS — E119 Type 2 diabetes mellitus without complications: Secondary | ICD-10-CM

## 2019-09-09 ENCOUNTER — Ambulatory Visit: Admit: 2019-09-09 | Payer: MEDICARE | Attending: Neurology | Primary: Internal Medicine

## 2019-09-09 ENCOUNTER — Ambulatory Visit: Attending: Neurology | Primary: Internal Medicine

## 2019-09-09 DIAGNOSIS — G40209 Localization-related (focal) (partial) symptomatic epilepsy and epileptic syndromes with complex partial seizures, not intractable, without status epilepticus: Secondary | ICD-10-CM

## 2019-09-09 NOTE — Progress Notes (Signed)
Neurology Progress Note    NAME:  Meghan [name]   DOB:   08/13/1946   MRN:   [id number]     Date/Time:  09/09/2019  Subjective:     Meghan [name] is a 74 y.o. female here today for follow-up for seizure disorder, memory difficulty, neuropathy, pain confusion.  This is a telephone visit  Patient is a resident [facility] long time [facility]  Most of the conversation was with the patient's nurse because patient has decreased hearing.  Patient is said to have been doing fairly well, has had about 3 seizures for the past year.  No fall was reported recently.  Patient was said to have tested COVID-19 positive but did not have she was diagnosed with it.  Due to COVID-19 pandemic crisis patient has not been able to fully participate in physical therapy.  Patient is said to be confused at times, memory is poor and has periodic shaking of the hand .  She ambulates with walker.  I am not making any change at this time  Review of Systems - General ROS: positive for  - fatigue and sleep disturbance  Psychological ROS: positive for - anxiety, concentration difficulties, memory difficulties and sleep disturbances  Ophthalmic ROS: positive for - blurry vision and photophobia  ENT ROS: positive for - headaches, tinnitus, vertigo and visual changes  Allergy and Immunology ROS: negative  Hematological and Lymphatic ROS: negative  Endocrine ROS: negative  Respiratory ROS: no cough, shortness of breath, or wheezing  Cardiovascular ROS: no chest pain or dyspnea on exertion  Gastrointestinal ROS: no abdominal pain, change in bowel habits, or black or bloody stools  Genito-Urinary ROS: no dysuria, trouble voiding, or hematuria  Musculoskeletal ROS: positive for - gait disturbance, joint pain, joint stiffness, muscle pain and muscular weakness  Neurological ROS: positive for - dizziness, gait disturbance, headaches, impaired coordination/balance, numbness/tingling, seizures, tremors, visual changes and weakness  Dermatological ROS:  negative     Medications reviewed:  Current Outpatient Medications   Medication Sig Dispense Refill   ??? rOPINIRole (REQUIP) 2 mg tablet Take 1 Tab by mouth nightly. 30 Tab 3   ??? folic acid (FOLVITE) 1 mg tablet Take 1 Tab by mouth daily. 30 Tab 3   ??? glucose (DEX4 GLUCOSE) 4 gram chewable tablet Take 4 Tabs by mouth as needed (as needed for hypoglycemia). 8 Tab 0   ??? memantine (NAMENDA) 10 mg tablet TAKE 1 TABLET BY MOUTH TWICE DAILY 60 Tab 0   ??? lacosamide (VIMPAT) 100 mg tab tablet TAKE 1 TABLET BY MOUTH TWICE DAILY 20 Tab 0   ??? donepezil (ARICEPT) 10 mg tablet TAKE 1 TABLET BY MOUTH EVERY NIGHT 30 Tab 0   ??? divalproex DR (DEPAKOTE) 500 mg tablet TAKE 1 TABLET BY MOUTH TWICE DAILY 180 Tab 0   ??? fenofibrate (LOFIBRA) 54 mg tablet Take 1 Tab by mouth nightly. 90 Tab 3   ??? cholecalciferol (VITAMIN D3) 1,000 unit cap take 1 capsule by mouth once daily 30 Cap 3   ??? fluticasone (FLONASE) 50 mcg/actuation nasal spray 1 Spray by Both Nostrils route daily. 1 Bottle 1   ??? aspirin 81 mg chewable tablet Take 81 mg by mouth daily.     ??? citalopram (CELEXA) 20 mg tablet Take 1 Tab by mouth daily. 90 Tab 3        Objective:   Vitals:  There were no vitals filed for this visit.              Lab Data Reviewed:  Lab Results   Component Value Date/Time    WBC 4.8 02/02/2019 04:30 PM    HCT 37.6 02/02/2019 04:30 PM    HGB 12.4 02/02/2019 04:30 PM    PLATELET 504 (H) 02/02/2019 04:30 PM       Lab Results   Component Value Date/Time    Sodium 133 (L) 02/02/2019 04:30 PM    Potassium 4.8 02/02/2019 04:30 PM    Chloride 100 02/02/2019 04:30 PM    CO2 31 02/02/2019 04:30 PM    Glucose 78 02/02/2019 04:30 PM    BUN 19 02/02/2019 04:30 PM    Creatinine 1.03 (H) 02/02/2019 04:30 PM    Calcium 9.0 02/02/2019 04:30 PM       No components found for: TROPQUANT    No results found for: ANA      Lab Results   Component Value Date/Time    Hemoglobin A1c 5.1 07/11/2018 12:12 PM    Hemoglobin A1c (POC) 5.4 01/08/2018 10:09 AM        Lab Results    Component Value Date/Time    Vitamin B12 1,250 (H) 07/12/2018 01:43 PM    Folate 18.0 07/12/2018 01:43 PM       No results found for: ANA, Phillip Heal, XBANA    Lab Results   Component Value Date/Time    Cholesterol, total 198 02/25/2018 03:49 AM    Cholesterol (POC) 169 01/08/2018 10:10 AM    HDL Cholesterol 48 02/25/2018 03:49 AM    HDL Cholesterol (POC) 45 01/08/2018 10:10 AM    LDL Cholesterol (POC) 108 01/08/2018 10:10 AM    LDL, calculated 132 (H) 02/25/2018 03:49 AM    VLDL, calculated 18 02/25/2018 03:49 AM    Triglyceride 90 02/25/2018 03:49 AM    Triglycerides (POC) 80 01/08/2018 10:10 AM    CHOL/HDL Ratio 4.1 02/25/2018 03:49 AM         CT Results (recent):      MRI Results (recent):  Results from [facility] encounter on 07/11/18   MRI BRAIN W WO CONT    Narrative EXAM:  MRI BRAIN W WO CONT    INDICATION:    altered mental status    COMPARISON:  February 24, 2018.    CONTRAST: 15 ml Dotarem.    TECHNIQUE:    Multiplanar multisequence acquisition without and with contrast of the brain.    FINDINGS:  Diffusion imaging does not show acute ischemic changes. There is no extra-axial  fluid collection hemorrhage or shift. Minimal nonspecific white changes.  Flow-voids in major vessels at the base of the brain are present. There is no  mass.  Partially empty sella.  No enhancing lesion or masses.      Impression IMPRESSION: No acute findings no mass. Minimal nonspecific white matter changes.       IR Results (recent):  No results found for this or any previous visit.    VAS/US Results (recent):  Results from Hospital Encounter encounter on 03/22/17   DUPLEX CAROTID BILATERAL        PHYSICAL EXAM:  Deferred      NEUROLOGICAL EXAM:   Deferred    Assesment  1. Partial symptomatic epilepsy with complex partial seizures, not intractable, without status epilepticus ([place])  Continue management    2. 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 Cerebrovascular accident (CVA) due to  thrombosis of middle cerebral artery, unspecified blood vessel laterality (HCC)  Stable    4.  Decreased hearing of both ears  Stable    5. Frequent falls  Improved  Physical therapy  6. Gait disorder  Physical therapy    ___________________________________________________  PLAN: Medication and plan discussed with patient's caregiver      ICD-10-CM ICD-9-CM    1. Partial symptomatic epilepsy with complex partial seizures, not intractable, without status epilepticus (HCC)  G40.209 345.40    2. Vascular dementia without behavioral disturbance (HCC)  F01.50 290.40    3. Cerebrovascular accident (CVA) due to thrombosis of middle cerebral artery, unspecified blood vessel laterality (HCC)  I63.319 434.01    4. Decreased hearing of both ears  H91.93 389.9    5. Frequent falls  R29.6 V15.88    6. Gait disorder  R26.9 781.2      Follow-up and Dispositions    ?? Return in about 1 year (around 09/08/2020).       25 minutes was spent on the phone for the visit, half of which was on counseling  ___________________________________________________    Attending Physician: Forrestine Him, MD

## 2019-09-09 NOTE — Progress Notes (Signed)
Neurology Progress Note    NAME:  Meghan [name]   DOB:   1946/01/11   MRN:   [id number]     Date/Time:  09/09/2019  Subjective:     Meghan [name] is a 74 y.o. female here today for follow-up for seizure disorder, memory difficulty, neuropathy, pain confusion.  This is a telephone visit  Patient is a resident [facility] long time [facility] facility  Most of the conversation was with the patient's nurse because patient has decreased hearing.  Patient is said to have been doing fairly well, has had about 3 seizures for the past year.  No fall was reported recently.  Patient was said to have tested COVID-19 positive but did not have she was diagnosed with it.  Due to COVID-19 pandemic crisis patient has not been able to fully participate in physical therapy.  Patient is said to be confused at times, memory is poor and has periodic shaking of the hand .  She ambulates with walker.  I am not making any change at this time  Review of Systems - General ROS: positive for  - fatigue and sleep disturbance  Psychological ROS: positive for - anxiety, concentration difficulties, memory difficulties and sleep disturbances  Ophthalmic ROS: positive for - blurry vision and photophobia  ENT ROS: positive for - headaches, tinnitus, vertigo and visual changes  Allergy and Immunology ROS: negative  Hematological and Lymphatic ROS: negative  Endocrine ROS: negative  Respiratory ROS: no cough, shortness of breath, or wheezing  Cardiovascular ROS: no chest pain or dyspnea on exertion  Gastrointestinal ROS: no abdominal pain, change in bowel habits, or black or bloody stools  Genito-Urinary ROS: no dysuria, trouble voiding, or hematuria  Musculoskeletal ROS: positive for - gait disturbance, joint pain, joint stiffness, muscle pain and muscular weakness  Neurological ROS: positive for - dizziness, gait disturbance, headaches, impaired coordination/balance, numbness/tingling, seizures, tremors, visual changes and weakness  Dermatological ROS:  negative     Medications reviewed:  Current Outpatient Medications   Medication Sig Dispense Refill   ??? rOPINIRole (REQUIP) 2 mg tablet Take 1 Tab by mouth nightly. 30 Tab 3   ??? folic acid (FOLVITE) 1 mg tablet Take 1 Tab by mouth daily. 30 Tab 3   ??? glucose (DEX4 GLUCOSE) 4 gram chewable tablet Take 4 Tabs by mouth as needed (as needed for hypoglycemia). 8 Tab 0   ??? memantine (NAMENDA) 10 mg tablet TAKE 1 TABLET BY MOUTH TWICE DAILY 60 Tab 0   ??? lacosamide (VIMPAT) 100 mg tab tablet TAKE 1 TABLET BY MOUTH TWICE DAILY 20 Tab 0   ??? donepezil (ARICEPT) 10 mg tablet TAKE 1 TABLET BY MOUTH EVERY NIGHT 30 Tab 0   ??? divalproex DR (DEPAKOTE) 500 mg tablet TAKE 1 TABLET BY MOUTH TWICE DAILY 180 Tab 0   ??? fenofibrate (LOFIBRA) 54 mg tablet Take 1 Tab by mouth nightly. 90 Tab 3   ??? cholecalciferol (VITAMIN D3) 1,000 unit cap take 1 capsule by mouth once daily 30 Cap 3   ??? fluticasone (FLONASE) 50 mcg/actuation nasal spray 1 Spray by Both Nostrils route daily. 1 Bottle 1   ??? aspirin 81 mg chewable tablet Take 81 mg by mouth daily.     ??? citalopram (CELEXA) 20 mg tablet Take 1 Tab by mouth daily. 90 Tab 3        Objective:   Vitals:  There were no vitals filed for this visit.  Lab Data Reviewed:  Lab Results   Component Value Date/Time    WBC 4.8 02/02/2019 04:30 PM    HCT 37.6 02/02/2019 04:30 PM    HGB 12.4 02/02/2019 04:30 PM    PLATELET 504 (H) 02/02/2019 04:30 PM       Lab Results   Component Value Date/Time    Sodium 133 (L) 02/02/2019 04:30 PM    Potassium 4.8 02/02/2019 04:30 PM    Chloride 100 02/02/2019 04:30 PM    CO2 31 02/02/2019 04:30 PM    Glucose 78 02/02/2019 04:30 PM    BUN 19 02/02/2019 04:30 PM    Creatinine 1.03 (H) 02/02/2019 04:30 PM    Calcium 9.0 02/02/2019 04:30 PM       No components found for: TROPQUANT    No results found for: ANA      Lab Results   Component Value Date/Time    Hemoglobin A1c 5.1 07/11/2018 12:12 PM    Hemoglobin A1c (POC) 5.4 01/08/2018 10:09 AM        Lab Results    Component Value Date/Time    Vitamin B12 1,250 (H) 07/12/2018 01:43 PM    Folate 18.0 07/12/2018 01:43 PM       No results found for: ANA, Phillip Heal, XBANA    Lab Results   Component Value Date/Time    Cholesterol, total 198 02/25/2018 03:49 AM    Cholesterol (POC) 169 01/08/2018 10:10 AM    HDL Cholesterol 48 02/25/2018 03:49 AM    HDL Cholesterol (POC) 45 01/08/2018 10:10 AM    LDL Cholesterol (POC) 108 01/08/2018 10:10 AM    LDL, calculated 132 (H) 02/25/2018 03:49 AM    VLDL, calculated 18 02/25/2018 03:49 AM    Triglyceride 90 02/25/2018 03:49 AM    Triglycerides (POC) 80 01/08/2018 10:10 AM    CHOL/HDL Ratio 4.1 02/25/2018 03:49 AM         CT Results (recent):      MRI Results (recent):  Results from [facility] encounter on 07/11/18   MRI BRAIN W WO CONT    Narrative EXAM:  MRI BRAIN W WO CONT    INDICATION:    altered mental status    COMPARISON:  February 24, 2018.    CONTRAST: 15 ml Dotarem.    TECHNIQUE:    Multiplanar multisequence acquisition without and with contrast of the brain.    FINDINGS:  Diffusion imaging does not show acute ischemic changes. There is no extra-axial  fluid collection hemorrhage or shift. Minimal nonspecific white changes.  Flow-voids in major vessels at the base of the brain are present. There is no  mass.  Partially empty sella.  No enhancing lesion or masses.      Impression IMPRESSION: No acute findings no mass. Minimal nonspecific white matter changes.       IR Results (recent):  No results found for this or any previous visit.    VAS/US Results (recent):  Results from Hospital Encounter encounter on 03/22/17   DUPLEX CAROTID BILATERAL        PHYSICAL EXAM:  Deferred      NEUROLOGICAL EXAM:   Deferred    Assesment  1. Partial symptomatic epilepsy with complex partial seizures, not intractable, without status epilepticus ([place])  Continue management    2. 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 Cerebrovascular accident (CVA) due to  thrombosis of middle cerebral artery, unspecified blood vessel laterality (HCC)  Stable    4.  Decreased hearing of both ears  Stable    5. Frequent falls  Improved  Physical therapy  6. Gait disorder  Physical therapy    ___________________________________________________  PLAN: Medication and plan discussed with patient's caregiver      ICD-10-CM ICD-9-CM    1. Partial symptomatic epilepsy with complex partial seizures, not intractable, without status epilepticus (HCC)  G40.209 345.40    2. Vascular dementia without behavioral disturbance (HCC)  F01.50 290.40    3. Cerebrovascular accident (CVA) due to thrombosis of middle cerebral artery, unspecified blood vessel laterality (HCC)  I63.319 434.01    4. Decreased hearing of both ears  H91.93 389.9    5. Frequent falls  R29.6 V15.88    6. Gait disorder  R26.9 781.2      Follow-up and Dispositions    ?? Return in about 1 year (around 09/08/2020).       25 minutes was spent on the phone for the visit, half of which was on counseling  ___________________________________________________    Attending Physician: Forrestine Him, MD

## 2019-09-26 ENCOUNTER — Ambulatory Visit: Payer: Medicare PPO | Admitting: Cardiology

## 2019-09-26 ENCOUNTER — Encounter: Payer: Self-pay | Admitting: Cardiology

## 2019-09-26 ENCOUNTER — Other Ambulatory Visit: Payer: Self-pay

## 2019-09-26 VITALS — BP 165/93 | HR 63 | Temp 97.7°F | Ht 64.0 in | Wt 252.0 lb

## 2019-09-26 DIAGNOSIS — N1832 Chronic kidney disease, stage 3b: Secondary | ICD-10-CM

## 2019-09-26 DIAGNOSIS — I493 Ventricular premature depolarization: Secondary | ICD-10-CM

## 2019-09-26 DIAGNOSIS — I10 Essential (primary) hypertension: Secondary | ICD-10-CM

## 2019-09-26 DIAGNOSIS — E785 Hyperlipidemia, unspecified: Secondary | ICD-10-CM

## 2019-09-26 DIAGNOSIS — R0609 Other forms of dyspnea: Secondary | ICD-10-CM

## 2019-09-26 NOTE — Progress Notes (Signed)
Primary Physician/Referring:  Prince Solian, MD  Patient ID: Angelica Mcconnell, female    DOB: 1946/03/13, 74 y.o.   MRN: [id number]  Chief Complaint  Patient presents with  . Shortness of Breath  . Hypertension  . Follow-up    6 week   HPI:    Angelica Mcconnell  is a 74 y.o. Caucasian female with hypertension, hyperlipidemia, diet-controlled diabetes mellitus with stage III chronic kidney disease, morbid obesity and obstructive sleep apnea on CPAP presents for f/u evaluation of shortness of breath and profound diaphoresis associated with post exercise recovery.  I had started her on metoprolol for hypertension and also palpitations on her last office visit.  States that since then she has noticed improvement in symptoms of palpitations and also dyspnea.  She is also made lifestyle changes and has lost 10 pounds in weight.  Past Medical History:  Diagnosis Date  . Gout   . HTN (hypertension)   . OSA (obstructive sleep apnea) 07/15/2013  . Reflux   . Sinus infection    Past Surgical History:  Procedure Laterality Date  . COLONOSCOPY    . COLONOSCOPY [name] 08/31/2014   Procedure: COLONOSCOPY;  Surgeon: Daneil Dolin, MD;  Location: [facility];  Service: Endoscopy;  Laterality: [name];  8:30 AM  . Fybroid emoval     Social History   Tobacco Use  . Smoking status: Former Smoker    Packs/day: 0.50    Types: Cigarettes    Quit date: 07/15/1989    Years since quitting: 30.2  . Smokeless tobacco: Never Used  Substance Use Topics  . Alcohol use: No    ROS  Review of Systems  Constitution: Positive for weight loss.  Cardiovascular: Positive for dyspnea on exertion.  Musculoskeletal: Positive for joint pain.   Objective  Blood pressure (!) 165/93, pulse 63, temperature 97.7 F (36.5 C), height '5\' 4"'  (1.626 m), weight 252 lb (114.3 kg), SpO2 97 %.  Vitals with BMI 09/26/2019 09/26/2019 08/18/2019  Height - '5\' 4"'  '5\' 4"'   Weight - 252 lbs 262 lbs 3 oz  BMI - 62.83 15.17    Systolic 616 073 710  Diastolic 93 97 84  Pulse 63 74 86     Physical Exam  Constitutional: She appears well-developed. No distress.  Morbidly obese  HENT:  Head: Atraumatic.  Eyes: Conjunctivae are normal.  Neck:  Short neck and difficult to evaluate JVP  Cardiovascular: Normal rate, regular rhythm and normal heart sounds. Exam reveals no gallop.  No murmur heard. Pulses:      Carotid pulses are 2+ on the right side and 2+ on the left side.      Dorsalis pedis pulses are 2+ on the right side and 2+ on the left side.       Posterior tibial pulses are 2+ on the right side and 2+ on the left side.  Femoral and popliteal pulse difficult to feel due to patient's body habitus.   Pulmonary/Chest: Effort normal and breath sounds normal.  Abdominal: Soft. Bowel sounds are normal.  Obese. Pannus present  Neurological: She is alert.   Laboratory examination:   No results for input(s): NA, K, CL, CO2, GLUCOSE, BUN, CREATININE, CALCIUM, GFRNONAA, GFRAA in the last 8760 hours. CrCl cannot be calculated (No successful lab value found.).  No flowsheet data found. No flowsheet data found. Lipid Panel  No results found for: CHOL, TRIG, HDL, CHOLHDL, VLDL, LDLCALC, LDLDIRECT HEMOGLOBIN A1C No results found for: HGBA1C, MPG TSH No  results for input(s): TSH in the last 8760 hours.  Labs 05/29/2019: A1c 6.4%, serum glucose 13 mg, BUN 14, creatinine 1.1, eGFR 49 ML, potassium 4.2. Labs 10/15/2018: Total cholesterol 194, triglycerides 177, HDL 46, LDL 113.  Medications and allergies  No Known Allergies   Current Outpatient Medications  Medication Instructions  . allopurinol (ZYLOPRIM) 300 mg, Oral, Daily  . amLODipine (NORVASC) 5 mg, Oral, Daily  . aspirin EC 81 mg, Oral, Daily  . Calcium Carbonate-Vitamin D (CALTRATE 600+D PO) 1 tablet, Oral, Daily  . fluticasone (FLONASE) 50 MCG/ACT nasal spray 1-2 sprays, Each Nare, As needed  . ipratropium (ATROVENT) 0.06 % nasal spray SMARTSIG:2  Spray(s) Both Nares 3 Times Daily PRN  . irbesartan (AVAPRO) 300 MG tablet 1 tablet, Daily  . metoprolol tartrate (LOPRESSOR) 25 mg, Oral, 2 times daily  . Multiple Vitamin (MULTIVITAMIN) capsule 1 capsule, Oral, Daily  . omeprazole (PRILOSEC) 20 mg, Oral, Daily PRN, Takes only as needed  . Vitamin D, Cholecalciferol, 25 MCG (1000 UT) TABS Oral    Radiology:  No results found.  Cardiac Studies:   Echocardiogram 08/25/2019:  Normal LV systolic function with EF 65%. Left ventricle cavity is normal  in size. Moderate concentric hypertrophy of the left ventricle. Normal  global wall motion. Doppler evidence of grade I (impaired) diastolic  dysfunction, normal LAP. Calculated EF 65%.  Trileaflet aortic valve. Mild (Grade I) aortic regurgitation.   IVC is dilated with respiratory variation. This may suggest elevated right  heart pressure  Lexiscan (Walking with mod Bruce)Tetrofosmin Stress Test  08/25/2019: Normal ECG stress. Resting EKG/ECG demonstrated normal sinus rhythm. Peak EKG/ECG revealed no ST-T wave abnormalities. Patient exercised on the Modified Bruce protocol. The heart rate response was accelerated and achieved 95% of MPHR. The baseline blood pressure was 150/94 mmHg and increased to 164/90 mmHg at peak infusion, which is a normal response to Lexiscan with Treadmill.  Mild soft tissue attenuation noted in the inferior wall. No ischemia or scar.  Stress LV EF: 61%.  No previous exam available for comparison. Low risk study.   Assessment     ICD-10-CM   1. DOE (dyspnea on exertion)  R06.00   2. Primary hypertension  I10   3. PVC (premature ventricular contraction)  I49.3   4. Stage 3b chronic kidney disease  N18.32   5. Mild hyperlipidemia  E78.5     EKG 08/18/2019: Normal sinus rhythm at rate of 80 bpm, normal axis.  PVCs (2).  Interpolated.  No evidence of ischemia, normal QT interval.   Recommendations:   No orders of the defined types were placed in this  encounter.   Angelica Mcconnell  is a 74 y.o. Caucasian female with hypertension, hyperlipidemia, diet-controlled diabetes mellitus with stage III chronic kidney disease, morbid obesity and obstructive sleep apnea on CPAP presents for f/u evaluation of shortness of breath.    She is now tolerating metoprolol 25 mg twice daily and has noticed mild improvement in dyspnea and also palpitations, she has lost about 10 pounds in weight.  Blood pressure today was uncontrollable patient states that blood pressure has been very well controlled at home and also another place when she has checked, she would like to continue to make lifestyle changes for now and does not want to make any changes to medication.  From cardiac standpoint as she has remained stable and no clinical evidence of heart failure, low risk stress test, I will see him back on a as needed basis.  In  view of prediabetes, hypertension, she may need to be on a low-dose statins.  However she appears to be very motivated for weight loss and lifestyle modification.  She does have mild renal insufficiency but has labs pending again soon with her PCP.  Adrian Prows, MD, [facility] 09/26/2019, 12:59 PM [facility]. [place] Office: [phone]

## 2019-10-15 ENCOUNTER — Other Ambulatory Visit: Payer: Self-pay

## 2019-10-15 ENCOUNTER — Ambulatory Visit: Payer: Medicare Other

## 2019-10-15 ENCOUNTER — Ambulatory Visit: Payer: Medicare PPO | Attending: Internal Medicine

## 2019-10-15 DIAGNOSIS — Z20822 Contact with and (suspected) exposure to covid-19: Secondary | ICD-10-CM | POA: Insufficient documentation

## 2019-10-16 ENCOUNTER — Telehealth: Payer: Self-pay | Admitting: *Deleted

## 2019-10-16 LAB — NOVEL CORONAVIRUS, NAA: SARS-CoV-2, NAA: NOT DETECTED

## 2019-10-16 NOTE — Telephone Encounter (Signed)
Pt called for result of COVID test obtained 10/15/19; explained that turn around time for result is based on the number of tests to be completed; pt informed results are available first on MyChart, and she will receive a call regarding result; pt advised to answer all calls; she verbalized understanding.

## 2019-10-19 ENCOUNTER — Ambulatory Visit: Payer: Medicare PPO | Attending: Internal Medicine

## 2019-10-19 DIAGNOSIS — Z23 Encounter for immunization: Secondary | ICD-10-CM | POA: Insufficient documentation

## 2019-10-19 NOTE — Progress Notes (Signed)
   Covid-19 Vaccination Clinic  Name:  Angelica Mcconnell    MRN: [id number] DOB: 1946-08-07  10/19/2019  Angelica Mcconnell was observed post Covid-19 immunization for 15 minutes without incident. She was provided with Vaccine Information Sheet and instruction to access the V-Safe system.   Angelica Mcconnell was instructed to call 911 with any severe reactions post vaccine: Marland Kitchen Difficulty breathing  . Swelling of face and throat  . A fast heartbeat  . A bad rash all over body  . Dizziness and weakness   Immunizations Administered    Name Date Dose VIS Date Route   Pfizer COVID-19 Vaccine 10/19/2019  9:13 AM 0.3 mL 07/25/2019 Intramuscular   Manufacturer: [employer], [employer]   Lot: [id number]   NDC: [address]-6

## 2019-11-19 ENCOUNTER — Ambulatory Visit: Payer: Medicare PPO | Attending: Internal Medicine

## 2019-11-19 DIAGNOSIS — Z23 Encounter for immunization: Secondary | ICD-10-CM

## 2019-11-19 NOTE — Progress Notes (Signed)
   Covid-19 Vaccination Clinic  Name:  Angelica Mcconnell    MRN: [id number] DOB: [birth year]  11/19/2019  [name] was observed post Covid-19 immunization for 15 minutes without incident. She was provided with Vaccine Information Sheet and instruction to access the V-Safe system.   [name] was instructed to call 911 with any severe reactions post vaccine: Marland Kitchen Difficulty breathing  . Swelling of face and throat  . A fast heartbeat  . A bad rash all over body  . Dizziness and weakness   Immunizations Administered    Name Date Dose VIS Date Route   Pfizer COVID-19 Vaccine 11/19/2019  4:36 PM 0.3 mL 07/25/2019 Intramuscular   Manufacturer: [employer], [employer]   Lot: [id number]   NDC: [address]-5

## 2019-12-16 ENCOUNTER — Ambulatory Visit: Admit: 2019-12-16 | Payer: MEDICARE | Attending: Neurology | Primary: Internal Medicine

## 2019-12-16 ENCOUNTER — Ambulatory Visit: Attending: Neurology | Primary: Internal Medicine

## 2019-12-16 DIAGNOSIS — G40209 Localization-related (focal) (partial) symptomatic epilepsy and epileptic syndromes with complex partial seizures, not intractable, without status epilepticus: Secondary | ICD-10-CM

## 2019-12-16 MED ORDER — RASAGILINE 0.5 MG TAB
0.5 mg | ORAL_TABLET | Freq: Every day | ORAL | 1 refills | Status: AC
Start: 2019-12-16 — End: ?

## 2019-12-16 NOTE — Progress Notes (Signed)
[id number]  Neurology Progress Note    NAME:  Meghan [name]   DOB:   23-Jul-1946   MRN:   [id number]     Date/Time:  12/16/2019  Subjective:   Meghan [name] is a 74 y.o. female here today for follow-up for seizure disorder, memory difficulty, tremor, neuropathy gait disorder   Patient is a resident [facility]  Patient was accompanied by her daughter to the visit.  Unfortunately, they did not give patient any records from the facility so as to reconcile the patient's current medications.  I tried to contact someone from the facility but it was not possible, my office may try again for which I may have to create an addendum.  No seizure was reported according to patient's daughter  since the last year.  Patient has improved in terms of her mobility, currently she uses walker to ambulate.  Confusion is minimal however due to patient's decreased hearing it is difficult to evaluate and qualify dementia.  Because patient did not tolerate Requip, Sinemet, her tremors seem to be a little more, does tremor both on intention and at rest, more at rest.  No fall was reported.  At this time, I will start patient on Azilect 1 mg p.o. daily.  As I do not have any records from the facility I will leave the rest of   medication for now   Review of Systems - General ROS: positive for  - fatigue and sleep disturbance  Psychological ROS: positive for - anxiety, concentration difficulties, memory difficulties and sleep disturbances  Ophthalmic ROS: positive for - blurry vision and photophobia  ENT ROS: positive for - headaches, tinnitus, vertigo and visual changes  Allergy and Immunology ROS: negative  Hematological and Lymphatic ROS: negative  Endocrine ROS: negative  Respiratory ROS: no cough, shortness of breath, or wheezing  Cardiovascular ROS: no chest pain or dyspnea on exertion  Gastrointestinal ROS: no abdominal pain, change in bowel habits, or black or bloody stools  Genito-Urinary ROS: no  dysuria, trouble voiding, or hematuria  Musculoskeletal ROS: positive for - gait disturbance, joint pain, joint stiffness, muscle pain and muscular weakness  Neurological ROS: positive for - dizziness, gait disturbance, headaches, impaired coordination/balance, numbness/tingling, seizures, tremors, visual changes and weakness  Dermatological ROS: negative        Medications reviewed:  Current Outpatient Medications   Medication Sig Dispense Refill   ??? rasagiline (AZILECT) 0.5 mg tab tablet Take 2 Tabs by mouth daily. 30 Tab 1   ??? rOPINIRole (REQUIP) 2 mg tablet Take 1 Tab by mouth nightly. 30 Tab 3   ??? folic acid (FOLVITE) 1 mg tablet Take 1 Tab by mouth daily. 30 Tab 3   ??? glucose (DEX4 GLUCOSE) 4 gram chewable tablet Take 4 Tabs by mouth as needed (as needed for hypoglycemia). 8 Tab 0   ??? memantine (NAMENDA) 10 mg tablet TAKE 1 TABLET BY MOUTH TWICE DAILY 60 Tab 0   ??? lacosamide (VIMPAT) 100 mg tab tablet TAKE 1 TABLET BY MOUTH TWICE DAILY 20 Tab 0   ??? donepezil (ARICEPT) 10 mg tablet TAKE 1 TABLET BY MOUTH EVERY NIGHT 30 Tab 0   ??? divalproex DR (DEPAKOTE) 500 mg tablet TAKE 1 TABLET BY MOUTH TWICE DAILY 180 Tab 0   ??? citalopram (CELEXA) 20 mg tablet Take 1 Tab by mouth daily. 90 Tab 3   ??? fenofibrate (LOFIBRA) 54 mg tablet Take 1 Tab by mouth nightly. 90 Tab 3   ???  cholecalciferol (VITAMIN D3) 1,000 unit cap take 1 capsule by mouth once daily 30 Cap 3   ??? fluticasone (FLONASE) 50 mcg/actuation nasal spray 1 Spray by Both Nostrils route daily. 1 Bottle 1   ??? aspirin 81 mg chewable tablet Take 81 mg by mouth daily.          Objective:   Vitals:  Vitals:    12/16/19 1521   BP: 134/78   Pulse: 82   Resp: 18   SpO2: 99%   Weight: 164 lb 6.4 oz (74.6 kg)   PainSc:   0 - No pain         Lab Data Reviewed:  Lab Results   Component Value Date/Time    WBC 4.8 02/02/2019 04:30 PM    HCT 37.6 02/02/2019 04:30 PM    HGB 12.4 02/02/2019 04:30 PM    PLATELET 504 (H) 02/02/2019 04:30 PM       Lab Results   Component Value  Date/Time    Sodium 133 (L) 02/02/2019 04:30 PM    Potassium 4.8 02/02/2019 04:30 PM    Chloride 100 02/02/2019 04:30 PM    CO2 31 02/02/2019 04:30 PM    Glucose 78 02/02/2019 04:30 PM    BUN 19 02/02/2019 04:30 PM    Creatinine 1.03 (H) 02/02/2019 04:30 PM    Calcium 9.0 02/02/2019 04:30 PM       No components found for: TROPQUANT    No results found for: ANA      Lab Results   Component Value Date/Time    Hemoglobin A1c 5.1 07/11/2018 12:12 PM    Hemoglobin A1c (POC) 5.4 01/08/2018 10:09 AM        Lab Results   Component Value Date/Time    Vitamin B12 1,250 (H) 07/12/2018 01:43 PM    Folate 18.0 07/12/2018 01:43 PM       No results found for: ANA, ANARX, ANAIGG, XBANA    Lab Results   Component Value Date/Time    Cholesterol, total 198 02/25/2018 03:49 AM    Cholesterol (POC) 169 01/08/2018 10:10 AM    HDL Cholesterol 48 02/25/2018 03:49 AM    HDL Cholesterol (POC) 45 01/08/2018 10:10 AM    LDL Cholesterol (POC) 108 01/08/2018 10:10 AM    LDL, calculated 132 (H) 02/25/2018 03:49 AM    VLDL, calculated 18 02/25/2018 03:49 AM    Triglyceride 90 02/25/2018 03:49 AM    Triglycerides (POC) 80 01/08/2018 10:10 AM    CHOL/HDL Ratio 4.1 02/25/2018 03:49 AM         CT Results (recent):      MRI Results (recent):  Results from Hospital Encounter encounter on 07/11/18   MRI BRAIN W WO CONT    Narrative EXAM:  MRI BRAIN W WO CONT    INDICATION:    altered mental status    COMPARISON:  February 24, 2018.    CONTRAST: 15 ml Dotarem.    TECHNIQUE:    Multiplanar multisequence acquisition without and with contrast of the brain.    FINDINGS:  Diffusion imaging does not show acute ischemic changes. There is no extra-axial  fluid collection hemorrhage or shift. Minimal nonspecific white changes.  Flow-voids in major vessels at the base of the brain are present. There is no  mass.  Partially empty sella.  No enhancing lesion or masses.      Impression IMPRESSION: No acute findings no mass. Minimal nonspecific white matter changes.        IR   Results (recent):  No results found for this or any previous visit.    VAS/US Results extremity recent):  Results from Hospital Encounter encounter on 03/22/17   DUPLEX CAROTID BILATERAL       PHYSICAL EXAM:  General:    Alert, cooperative, no distress, appears stated age.     Head:   Normocephalic, without obvious abnormality, atraumatic.  Eyes:   Conjunctivae/corneas clear.  PERRLA  Nose:  Nares normal. No drainage or sinus tenderness.  Throat:    Lips, mucosa, and tongue normal.  No Thrush  Neck:  Supple, symmetrical,  no adenopathy, thyroid: non tender    no carotid bruit and no JVD.  Back:    Symmetric,  No CVA tenderness.  Lungs:   Clear to auscultation bilaterally.  No Wheezing or Rhonchi. No rales.  Chest wall:  No tenderness or deformity. No Accessory muscle use.  Heart:   Regular rate and rhythm,  no murmur, rub or gallop.  Abdomen:   Soft, non-tender. Not distended.  Bowel sounds normal. No masses  Extremities: Extremities normal, atraumatic, No cyanosis.  No edema. No clubbing  Skin:     Texture, turgor normal. No rashes or lesions.  Not Jaundiced  Lymph nodes: Cervical, supraclavicular normal.  Psych:  Good insight.  Flat affect.  Not anxious or agitated.      NEUROLOGICAL EXAM:  Appearance:  The patient is well developed, well nourished, provides a coherent history and is in no acute distress.   Mental Status: Oriented to t person. Mood and affect appropriate.   Cranial Nerves:   Intact visual fields. Fundi are benign. PERLA, EOM's full, no nystagmus, no ptosis. Facial sensation is normal. Corneal reflexes are intact. Facial movement is symmetric. Hearing is normal bilaterally. Palate is midline with normal sternocleidomastoid and trapezius muscles are normal. Tongue is midline.   Motor:  5-/5 strength in upper extremity and 4/+5 lower extremity proximal and distal muscles. Normal bulk and tone. No fasciculations.   Reflexes:   Deep tendon reflexes 2+/4 and symmetrical.   Sensory:    Decreased  sensation to touch, pinprick and vibration.   Gait:   Unsteady  gait.  Ambulates with walker   Tremor:   Tremor noted both at rest and on intention.   Cerebellar:  No cerebellar signs present.   Neurovascular:  Normal heart sounds and regular rhythm, peripheral pulses intact, and no carotid bruits.       Assesment  1. Partial symptomatic epilepsy with complex partial seizures, not intractable, without status epilepticus (HCC)  Continue  Vimpat    2. 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 Parkinsonism, unspecified Parkinsonism type (HCC)   Azilect    4. Vascular dementia without behavioral disturbance (HCC)  Namenda and Aricept     ___________________________________________________  PLAN:Medication and plan discussed with patient's daughter.      ICD-10-CM ICD-9-CM    1. Partial symptomatic epilepsy with complex partial seizures, not intractable, without status epilepticus (HCC)  G40.209 345.40    2. 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 Parkinsonism, unspecified Parkinsonism type (HCC)  G20 332.0    4. Vascular dementia without behavioral disturbance (HCC)  F01.50 290.40      Follow-up and Dispositions    ?? Return in about 4 months (around 04/17/2020).  ___________________________________________________    Attending Physician: Nelwyn Salisbury, MD

## 2019-12-16 NOTE — Progress Notes (Signed)
[id number]  Neurology Progress Note    NAME:  Meghan [name]   DOB:   23-Jul-1946   MRN:   [id number]     Date/Time:  12/16/2019  Subjective:   Meghan [name] is a 74 y.o. female here today for follow-up for seizure disorder, memory difficulty, tremor, neuropathy gait disorder   Patient is a resident [facility]  Patient was accompanied by her daughter to the visit.  Unfortunately, they did not give patient any records from the facility so as to reconcile the patient's current medications.  I tried to contact someone from the facility but it was not possible, my office may try again for which I may have to create an addendum.  No seizure was reported according to patient's daughter  since the last year.  Patient has improved in terms of her mobility, currently she uses walker to ambulate.  Confusion is minimal however due to patient's decreased hearing it is difficult to evaluate and qualify dementia.  Because patient did not tolerate Requip, Sinemet, her tremors seem to be a little more, does tremor both on intention and at rest, more at rest.  No fall was reported.  At this time, I will start patient on Azilect 1 mg p.o. daily.  As I do not have any records from the facility I will leave the rest of   medication for now   Review of Systems - General ROS: positive for  - fatigue and sleep disturbance  Psychological ROS: positive for - anxiety, concentration difficulties, memory difficulties and sleep disturbances  Ophthalmic ROS: positive for - blurry vision and photophobia  ENT ROS: positive for - headaches, tinnitus, vertigo and visual changes  Allergy and Immunology ROS: negative  Hematological and Lymphatic ROS: negative  Endocrine ROS: negative  Respiratory ROS: no cough, shortness of breath, or wheezing  Cardiovascular ROS: no chest pain or dyspnea on exertion  Gastrointestinal ROS: no abdominal pain, change in bowel habits, or black or bloody stools  Genito-Urinary ROS: no  dysuria, trouble voiding, or hematuria  Musculoskeletal ROS: positive for - gait disturbance, joint pain, joint stiffness, muscle pain and muscular weakness  Neurological ROS: positive for - dizziness, gait disturbance, headaches, impaired coordination/balance, numbness/tingling, seizures, tremors, visual changes and weakness  Dermatological ROS: negative        Medications reviewed:  Current Outpatient Medications   Medication Sig Dispense Refill   ??? rasagiline (AZILECT) 0.5 mg tab tablet Take 2 Tabs by mouth daily. 30 Tab 1   ??? rOPINIRole (REQUIP) 2 mg tablet Take 1 Tab by mouth nightly. 30 Tab 3   ??? folic acid (FOLVITE) 1 mg tablet Take 1 Tab by mouth daily. 30 Tab 3   ??? glucose (DEX4 GLUCOSE) 4 gram chewable tablet Take 4 Tabs by mouth as needed (as needed for hypoglycemia). 8 Tab 0   ??? memantine (NAMENDA) 10 mg tablet TAKE 1 TABLET BY MOUTH TWICE DAILY 60 Tab 0   ??? lacosamide (VIMPAT) 100 mg tab tablet TAKE 1 TABLET BY MOUTH TWICE DAILY 20 Tab 0   ??? donepezil (ARICEPT) 10 mg tablet TAKE 1 TABLET BY MOUTH EVERY NIGHT 30 Tab 0   ??? divalproex DR (DEPAKOTE) 500 mg tablet TAKE 1 TABLET BY MOUTH TWICE DAILY 180 Tab 0   ??? citalopram (CELEXA) 20 mg tablet Take 1 Tab by mouth daily. 90 Tab 3   ??? fenofibrate (LOFIBRA) 54 mg tablet Take 1 Tab by mouth nightly. 90 Tab 3   ???  cholecalciferol (VITAMIN D3) 1,000 unit cap take 1 capsule by mouth once daily 30 Cap 3   ??? fluticasone (FLONASE) 50 mcg/actuation nasal spray 1 Spray by Both Nostrils route daily. 1 Bottle 1   ??? aspirin 81 mg chewable tablet Take 81 mg by mouth daily.          Objective:   Vitals:  Vitals:    12/16/19 1521   BP: 134/78   Pulse: 82   Resp: 18   SpO2: 99%   Weight: 164 lb 6.4 oz (74.6 kg)   PainSc:   0 - No pain         Lab Data Reviewed:  Lab Results   Component Value Date/Time    WBC 4.8 02/02/2019 04:30 PM    HCT 37.6 02/02/2019 04:30 PM    HGB 12.4 02/02/2019 04:30 PM    PLATELET 504 (H) 02/02/2019 04:30 PM       Lab Results   Component Value  Date/Time    Sodium 133 (L) 02/02/2019 04:30 PM    Potassium 4.8 02/02/2019 04:30 PM    Chloride 100 02/02/2019 04:30 PM    CO2 31 02/02/2019 04:30 PM    Glucose 78 02/02/2019 04:30 PM    BUN 19 02/02/2019 04:30 PM    Creatinine 1.03 (H) 02/02/2019 04:30 PM    Calcium 9.0 02/02/2019 04:30 PM       No components found for: TROPQUANT    No results found for: ANA      Lab Results   Component Value Date/Time    Hemoglobin A1c 5.1 07/11/2018 12:12 PM    Hemoglobin A1c (POC) 5.4 01/08/2018 10:09 AM        Lab Results   Component Value Date/Time    Vitamin B12 1,250 (H) 07/12/2018 01:43 PM    Folate 18.0 07/12/2018 01:43 PM       No results found for: ANA, Everlean Alstrom, XBANA    Lab Results   Component Value Date/Time    Cholesterol, total 198 02/25/2018 03:49 AM    Cholesterol (POC) 169 01/08/2018 10:10 AM    HDL Cholesterol 48 02/25/2018 03:49 AM    HDL Cholesterol (POC) 45 01/08/2018 10:10 AM    LDL Cholesterol (POC) 108 63/87/5643 10:10 AM    LDL, calculated 132 (H) 02/25/2018 03:49 AM    VLDL, calculated 18 02/25/2018 03:49 AM    Triglyceride 90 02/25/2018 03:49 AM    Triglycerides (POC) 80 01/08/2018 10:10 AM    CHOL/HDL Ratio 4.1 02/25/2018 03:49 AM         CT Results (recent):      MRI Results (recent):  Results from Hospital Encounter encounter on 07/11/18   MRI BRAIN W WO CONT    Narrative EXAM:  MRI BRAIN W WO CONT    INDICATION:    altered mental status    COMPARISON:  February 24, 2018.    CONTRAST: 15 ml Dotarem.    TECHNIQUE:    Multiplanar multisequence acquisition without and with contrast of the brain.    FINDINGS:  Diffusion imaging does not show acute ischemic changes. There is no extra-axial  fluid collection hemorrhage or shift. Minimal nonspecific white changes.  Flow-voids in major vessels at the base of the brain are present. There is no  mass.  Partially empty sella.  No enhancing lesion or masses.      Impression IMPRESSION: No acute findings no mass. Minimal nonspecific white matter changes.        IR  Results (recent):  No results found for this or any previous visit.    VAS/US Results extremity recent):  Results from Hospital Encounter encounter on 03/22/17   DUPLEX CAROTID BILATERAL       PHYSICAL EXAM:  General:    Alert, cooperative, no distress, appears stated age.     Head:   Normocephalic, without obvious abnormality, atraumatic.  Eyes:   Conjunctivae/corneas clear.  PERRLA  Nose:  Nares normal. No drainage or sinus tenderness.  Throat:    Lips, mucosa, and tongue normal.  No Thrush  Neck:  Supple, symmetrical,  no adenopathy, thyroid: non tender    no carotid bruit and no JVD.  Back:    Symmetric,  No CVA tenderness.  Lungs:   Clear to auscultation bilaterally.  No Wheezing or Rhonchi. No rales.  Chest wall:  No tenderness or deformity. No Accessory muscle use.  Heart:   Regular rate and rhythm,  no murmur, rub or gallop.  Abdomen:   Soft, non-tender. Not distended.  Bowel sounds normal. No masses  Extremities: Extremities normal, atraumatic, No cyanosis.  No edema. No clubbing  Skin:     Texture, turgor normal. No rashes or lesions.  Not Jaundiced  Lymph nodes: Cervical, supraclavicular normal.  Psych:  Good insight.  Flat affect.  Not anxious or agitated.      NEUROLOGICAL EXAM:  Appearance:  The patient is well developed, well nourished, provides a coherent history and is in no acute distress.   Mental Status: Oriented to t person. Mood and affect appropriate.   Cranial Nerves:   Intact visual fields. Fundi are benign. PERLA, EOM's full, no nystagmus, no ptosis. Facial sensation is normal. Corneal reflexes are intact. Facial movement is symmetric. Hearing is normal bilaterally. Palate is midline with normal sternocleidomastoid and trapezius muscles are normal. Tongue is midline.   Motor:  5-/5 strength in upper extremity and 4/+5 lower extremity proximal and distal muscles. Normal bulk and tone. No fasciculations.   Reflexes:   Deep tendon reflexes 2+/4 and symmetrical.   Sensory:    Decreased  sensation to touch, pinprick and vibration.   Gait:   Unsteady  gait.  Ambulates with walker   Tremor:   Tremor noted both at rest and on intention.   Cerebellar:  No cerebellar signs present.   Neurovascular:  Normal heart sounds and regular rhythm, peripheral pulses intact, and no carotid bruits.       Assesment  1. Partial symptomatic epilepsy with complex partial seizures, not intractable, without status epilepticus (HCC)  Continue  Vimpat    2. 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 Parkinsonism, unspecified Parkinsonism type (HCC)   Azilect    4. Vascular dementia without behavioral disturbance (HCC)  Namenda and Aricept     ___________________________________________________  PLAN:Medication and plan discussed with patient's daughter.      ICD-10-CM ICD-9-CM    1. Partial symptomatic epilepsy with complex partial seizures, not intractable, without status epilepticus (HCC)  G40.209 345.40    2. 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 Parkinsonism, unspecified Parkinsonism type (HCC)  G20 332.0    4. Vascular dementia without behavioral disturbance (HCC)  F01.50 290.40      Follow-up and Dispositions    ?? Return in about 4 months (around 04/17/2020).  ___________________________________________________    Attending Physician: Nelwyn Salisbury, MD

## 2020-03-24 DIAGNOSIS — M154 Erosive (osteo)arthritis: Secondary | ICD-10-CM | POA: Diagnosis not present

## 2020-03-24 DIAGNOSIS — I129 Hypertensive chronic kidney disease with stage 1 through stage 4 chronic kidney disease, or unspecified chronic kidney disease: Secondary | ICD-10-CM | POA: Diagnosis not present

## 2020-03-24 DIAGNOSIS — R82998 Other abnormal findings in urine: Secondary | ICD-10-CM | POA: Diagnosis not present

## 2020-03-24 DIAGNOSIS — N1831 Chronic kidney disease, stage 3a: Secondary | ICD-10-CM | POA: Diagnosis not present

## 2020-03-24 DIAGNOSIS — Z1212 Encounter for screening for malignant neoplasm of rectum: Secondary | ICD-10-CM | POA: Diagnosis not present

## 2020-03-24 DIAGNOSIS — E1122 Type 2 diabetes mellitus with diabetic chronic kidney disease: Secondary | ICD-10-CM | POA: Diagnosis not present

## 2020-03-24 DIAGNOSIS — G4733 Obstructive sleep apnea (adult) (pediatric): Secondary | ICD-10-CM | POA: Diagnosis not present

## 2020-04-23 ENCOUNTER — Inpatient Hospital Stay
Admit: 2020-04-23 | Discharge: 2020-04-24 | Disposition: A | Discharge status: Skilled Nursing Facility | Payer: MEDICARE | Attending: Emergency Medicine

## 2020-04-23 ENCOUNTER — Emergency Department: Admit: 2020-04-23 | Payer: MEDICARE | Primary: Internal Medicine

## 2020-04-23 DIAGNOSIS — E860 Dehydration: Secondary | ICD-10-CM

## 2020-04-23 LAB — METABOLIC PANEL, COMPREHENSIVE
A-G Ratio: 0.6 — ABNORMAL LOW (ref 1.1–2.2)
ALT (SGPT): 22 U/L (ref 12–78)
AST (SGOT): 66 U/L — ABNORMAL HIGH (ref 15–37)
Albumin: 2.4 g/dL — ABNORMAL LOW (ref 3.5–5.0)
Alk. phosphatase: 43 U/L — ABNORMAL LOW (ref 45–117)
Anion gap: 5 mmol/L (ref 5–15)
BUN/Creatinine ratio: 31 — ABNORMAL HIGH (ref 12–20)
BUN: 43 MG/DL — ABNORMAL HIGH (ref 6–20)
Bilirubin, total: 0.4 MG/DL (ref 0.2–1.0)
CO2: 30 mmol/L (ref 21–32)
Calcium: 9.1 MG/DL (ref 8.5–10.1)
Chloride: 107 mmol/L (ref 97–108)
Creatinine: 1.37 MG/DL — ABNORMAL HIGH (ref 0.55–1.02)
GFR est AA: 46 mL/min/{1.73_m2} — ABNORMAL LOW (ref >60)
GFR est non-AA: 38 mL/min/{1.73_m2} — ABNORMAL LOW (ref >60)
Globulin: 3.8 g/dL (ref 2.0–4.0)
Glucose: 144 mg/dL — ABNORMAL HIGH (ref 65–100)
Potassium: 4.4 mmol/L (ref 3.5–5.1)
Protein, total: 6.2 g/dL — ABNORMAL LOW (ref 6.4–8.2)
Sodium: 142 mmol/L (ref 136–145)

## 2020-04-23 LAB — BLOOD GAS,CHEM8,LACTIC ACID POC
BICARBONATE: 26 mmol/L
Base deficit (POC): 0.5 mmol/L
CO2, POC: 27 MMOL/L — ABNORMAL HIGH (ref 19–24)
Calcium, ionized (POC): 1.14 mmol/L (ref 1.12–1.32)
Chloride, POC: 114 MMOL/L — ABNORMAL HIGH (ref 100–108)
Creatinine, POC: 1.1 MG/DL (ref 0.6–1.3)
Glucose, POC: 88 MG/DL (ref 74–106)
Lactic Acid (POC): 1.27 mmol/L (ref 0.40–2.00)
Potassium, POC: 3.8 MMOL/L (ref 3.5–5.5)
Sodium, POC: 147 MMOL/L — ABNORMAL HIGH (ref 136–145)
pCO2, venous (POC): 51.4 MMHG — ABNORMAL HIGH (ref 41–51)
pH, venous (POC): 7.32 (ref 7.32–7.42)
pO2, venous (POC): 17 mmHg — ABNORMAL LOW (ref 25–40)

## 2020-04-23 LAB — URINALYSIS W/ REFLEX CULTURE
BACTERIA, URINE: NEGATIVE /hpf
Bacteria: NEGATIVE /hpf
Bilirubin, Urine: NEGATIVE
Bilirubin: NEGATIVE
Glucose, Ur: NEGATIVE mg/dL
Glucose: NEGATIVE mg/dL
Ketone: NEGATIVE mg/dL
Ketones, Urine: NEGATIVE mg/dL
Nitrite, Urine: NEGATIVE
Nitrites: NEGATIVE
Protein, UA: NEGATIVE mg/dL
Protein: NEGATIVE mg/dL
Specific Gravity, UA: 1.022 (ref 1.003–1.030)
Specific gravity: 1.022 (ref 1.003–1.030)
Urobilinogen, UA, POCT: 1 EU/dL (ref 0.2–1.0)
Urobilinogen: 1 EU/dL (ref 0.2–1.0)
pH (UA): 5.5 (ref 5.0–8.0)
pH, UA: 5.5 (ref 5.0–8.0)

## 2020-04-23 LAB — CBC WITH AUTOMATED DIFF
ABS. BASOPHILS: 0.1 10*3/uL (ref 0.0–0.1)
ABS. EOSINOPHILS: 0.1 10*3/uL (ref 0.0–0.4)
ABS. IMM. GRANS.: 0.1 10*3/uL — ABNORMAL HIGH (ref 0.00–0.04)
ABS. LYMPHOCYTES: 1.1 10*3/uL (ref 0.8–3.5)
ABS. MONOCYTES: 0.7 10*3/uL (ref 0.0–1.0)
ABS. NEUTROPHILS: 4.8 10*3/uL (ref 1.8–8.0)
ABSOLUTE NRBC: 0 10*3/uL (ref 0.00–0.01)
BASOPHILS: 1 % (ref 0–1)
EOSINOPHILS: 1 % (ref 0–7)
HCT: 38.5 % (ref 35.0–47.0)
HGB: 12.2 g/dL (ref 11.5–16.0)
IMMATURE GRANULOCYTES: 1 % — ABNORMAL HIGH (ref 0.0–0.5)
LYMPHOCYTES: 16 % (ref 12–49)
MCH: 31.3 PG (ref 26.0–34.0)
MCHC: 31.7 g/dL (ref 30.0–36.5)
MCV: 98.7 FL (ref 80.0–99.0)
MONOCYTES: 10 % (ref 5–13)
MPV: 10.7 FL (ref 8.9–12.9)
NEUTROPHILS: 71 % (ref 32–75)
NRBC: 0 PER 100 WBC
PLATELET: 667 10*3/uL — ABNORMAL HIGH (ref 150–400)
RBC: 3.9 M/uL (ref 3.80–5.20)
RDW: 15.3 % — ABNORMAL HIGH (ref 11.5–14.5)
WBC: 6.9 10*3/uL (ref 3.6–11.0)

## 2020-04-23 LAB — POC LACTIC ACID: Lactic Acid (POC): 3.77 mmol/L — CR (ref 0.40–2.00)

## 2020-04-23 LAB — COMPREHENSIVE METABOLIC PANEL
ALT: 22 U/L (ref 12–78)
AST: 66 U/L — ABNORMAL HIGH (ref 15–37)
Albumin/Globulin Ratio: 0.6 — ABNORMAL LOW (ref 1.1–2.2)
Albumin: 2.4 g/dL — ABNORMAL LOW (ref 3.5–5.0)
Alkaline Phosphatase: 43 U/L — ABNORMAL LOW (ref 45–117)
Anion Gap: 5 mmol/L (ref 5–15)
BUN: 43 MG/DL — ABNORMAL HIGH (ref 6–20)
Bun/Cre Ratio: 31 — ABNORMAL HIGH (ref 12–20)
CO2: 30 mmol/L (ref 21–32)
Calcium: 9.1 MG/DL (ref 8.5–10.1)
Chloride: 107 mmol/L (ref 97–108)
Creatinine: 1.37 MG/DL — ABNORMAL HIGH (ref 0.55–1.02)
EGFR IF NonAfrican American: 38 mL/min/{1.73_m2} — ABNORMAL LOW (ref >60)
GFR African American: 46 mL/min/{1.73_m2} — ABNORMAL LOW (ref >60)
Globulin: 3.8 g/dL (ref 2.0–4.0)
Glucose: 144 mg/dL — ABNORMAL HIGH (ref 65–100)
Potassium: 4.4 mmol/L (ref 3.5–5.1)
Sodium: 142 mmol/L (ref 136–145)
Total Bilirubin: 0.4 MG/DL (ref 0.2–1.0)
Total Protein: 6.2 g/dL — ABNORMAL LOW (ref 6.4–8.2)

## 2020-04-23 LAB — CBC WITH AUTO DIFFERENTIAL
Basophils %: 1 % (ref 0–1)
Basophils Absolute: 0.1 10*3/uL (ref 0.0–0.1)
Eosinophils %: 1 % (ref 0–7)
Eosinophils Absolute: 0.1 10*3/uL (ref 0.0–0.4)
Granulocyte Absolute Count: 0.1 10*3/uL — ABNORMAL HIGH (ref 0.00–0.04)
Hematocrit: 38.5 % (ref 35.0–47.0)
Hemoglobin: 12.2 g/dL (ref 11.5–16.0)
Immature Granulocytes: 1 % — ABNORMAL HIGH (ref 0.0–0.5)
Lymphocytes %: 16 % (ref 12–49)
Lymphocytes Absolute: 1.1 10*3/uL (ref 0.8–3.5)
MCH: 31.3 PG (ref 26.0–34.0)
MCHC: 31.7 g/dL (ref 30.0–36.5)
MCV: 98.7 FL (ref 80.0–99.0)
MPV: 10.7 FL (ref 8.9–12.9)
Monocytes %: 10 % (ref 5–13)
Monocytes Absolute: 0.7 10*3/uL (ref 0.0–1.0)
NRBC Absolute: 0 10*3/uL (ref 0.00–0.01)
Neutrophils %: 71 % (ref 32–75)
Neutrophils Absolute: 4.8 10*3/uL (ref 1.8–8.0)
Nucleated RBCs: 0 PER 100 WBC
Platelets: 667 10*3/uL — ABNORMAL HIGH (ref 150–400)
RBC: 3.9 M/uL (ref 3.80–5.20)
RDW: 15.3 % — ABNORMAL HIGH (ref 11.5–14.5)
WBC: 6.9 10*3/uL (ref 3.6–11.0)

## 2020-04-23 LAB — POCT LACTIC ACID: POC Lactic Acid: 3.77 mmol/L (ref 0.40–2.00)

## 2020-04-23 MED ORDER — SODIUM CHLORIDE 0.9% BOLUS IV
0.9 % | INTRAVENOUS | Status: AC
Start: 2020-04-23 — End: 2020-04-23
  Administered 2020-04-23: 20:00:00 via INTRAVENOUS

## 2020-04-23 MED ORDER — SODIUM CHLORIDE 0.9% BOLUS IV
0.9 % | INTRAVENOUS | Status: AC
Start: 2020-04-23 — End: 2020-04-23
  Administered 2020-04-23: 21:00:00 via INTRAVENOUS

## 2020-04-23 MED FILL — SODIUM CHLORIDE 0.9 % IV: INTRAVENOUS | Qty: 1000

## 2020-04-23 NOTE — ED Notes (Deleted)
Pt still in CT. Awaiting for her return to get blood work and fluids started.
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
## 2020-04-23 NOTE — ED Notes (Signed)
[facility] at bedside to transport patient back to [facility].

## 2020-04-23 NOTE — ED Notes (Signed)
1435-attempted to straight cath for urine without success. Will reattempt after IVF; MD notified. Delay in PIV access d/t difficult stick. Patient to CT now; will ask tech to look for PIV once patient back to room.

## 2020-04-28 LAB — CULTURE, BLOOD, PAIRED
Culture result:: NO GROWTH
Culture: NO GROWTH

## 2020-05-06 ENCOUNTER — Encounter: Attending: Neurology | Primary: Internal Medicine

## 2020-05-22 DIAGNOSIS — Z23 Encounter for immunization: Secondary | ICD-10-CM | POA: Diagnosis not present

## 2020-11-16 ENCOUNTER — Encounter: Payer: Self-pay | Admitting: Neurology

## 2020-11-16 ENCOUNTER — Other Ambulatory Visit: Payer: Self-pay

## 2020-11-16 ENCOUNTER — Ambulatory Visit: Payer: Medicare PPO | Admitting: Neurology

## 2020-11-16 ENCOUNTER — Telehealth: Payer: Self-pay

## 2020-11-16 VITALS — BP 112/82 | HR 59 | Ht 65.0 in | Wt 249.0 lb

## 2020-11-16 DIAGNOSIS — Z9989 Dependence on other enabling machines and devices: Secondary | ICD-10-CM

## 2020-11-16 DIAGNOSIS — G4733 Obstructive sleep apnea (adult) (pediatric): Secondary | ICD-10-CM | POA: Diagnosis not present

## 2020-11-16 NOTE — Telephone Encounter (Signed)
Pt's CPAP order has been sent to [facility]. # [phone] [id number]. Order confirmation received.

## 2020-11-16 NOTE — Patient Instructions (Signed)
It was good to see you again today.  You have continued to do well on CPAP therapy, you are compliant with treatment.  Please continue to work on weight loss and consistently use your CPAP.  I have renewed your prescription for supplies, you should be able to get these from [place] as before.  Please follow-up routinely in this clinic to see one of our nurse practitioners in 1 year.

## 2020-11-16 NOTE — Progress Notes (Signed)
Subjective:    Patient ID: Angelica Mcconnell is a 74 y.o. female.  HPI     Star Age, MD, PhD [facility] [address], [address] [place], [place] [address]   Angelica Mcconnell is a 75 year old right-handed woman with an underlying medical history of obesity, hypertension, prior smoking, reflux disease, who presents for re-evaluation of her obstructive sleep apnea after a long gap. The patient is unaccompanied today and presents to reestablish care. She was last seen in this clinic nearly 3  years ago, on 06/05/17, at which time she was compliant with her CPAP and endorsed ongoing good results.   Today, 11/16/2020: I reviewed her CPAP compliance data from 10/13/2020 through 11/11/2020, which is a total of 30 days, during which time she used her machine 22 days with percent use days greater than 4 hours at 70%, indicating adequate compliance with an average usage of 8 hours/days on treatment, residual AHI at goal at 0.5/h, leak on the low side with a 95th percentile at 0.2 L/min on a pressure of 6 cm with EPR of 3.  She reports doing well with her CPAP.  She tried to skip every other night to see if she can go without treatment but overall feels better with treatment and has been using her machine more consistently.  She does need new supplies, DME company is [employer].  She was informed that she will need a new prescription.  She goes to bed generally between 11 PM or midnight, she lives alone, rise time varies, she will listen to her pastor between 6 and 8 on the phone and after prior she will often stay in bed and may doze off until 9 AM typically.  She had 1 new prescription for her blood pressure, she has a physical coming up later this month with blood work a week prior.  She has had some allergy issues, uses over-the-counter nasal spray and allergy medicine.  She does not drink caffeine on a day-to-day basis owing to reflux issues and also blood pressure.    The patient's allergies, current medications, family history, past medical history, past social history, past surgical history and problem list were reviewed and updated as appropriate.    Previously (copied from previous notes for reference):   I saw her on 05/31/2016, at which time she was compliant with CPAP and was doing well. Had cataract surgery on the left in August 2017.  I reviewed her CPAP compliance data from 05/05/17 through 06/03/17, which is a total of 30 days, during which time she used her machine 28 days, with percent used days greater than 4 hours at 90%, indicating excellent compliance with an average usage of 8 hours and 54 minutes, residual AHI 1.3 per hour, leak on the low side with the 95th percentile at 5.7 L/m on a pressure of 6 cm with EPR of 3.    I saw her on 06/01/2015, at which time she was fully compliant with CPAP therapy. She was doing well and had no sleep related complaints. She was working on weight loss. I suggested a one-year checkup.   I reviewed her CPAP compliance data from 04/29/2016 through 05/28/2016 which is a total of 30 days, during which time she used her CPAP every night with percent used days greater than 4 hours at 100%, indicating superb compliance with an average usage of 8 hours and 52 minutes, residual AHI of 0.8 per hour, leaked low with the 95th percentile at  6.9 L/m on a pressure of 6 cm with EPR of 3.   I saw her on 05/28/14, at which time she was doing well, with no new symptoms. She was compliant with treatment.   I reviewed her CPAP compliance data from 04/30/2015 through 05/29/2015 which is a total of 30 days during which time she used her machine every night with percent used days greater than 4 hours at 100%, indicating superb compliance with an average usage of 9 hours and 27 minutes, residual AHI low at 0.8 per hour, leak low with the 95th percentile at 4.5 L/m on a pressure of 6 cm.   I saw her on 11/25/2013, at which time she  reported sleeping better with CPAP, waking up better rested and less nocturia. I congratulated her on her great compliance and encouraged her to continue using CPAP regularly.   I reviewed her compliance data from 04/26/2014 through 05/25/2014 which is a total of 30 days during which time she used her CPAP every day. Percent used days greater than 4 hours was 97%, indicating excellent compliance, residual AHI low at 1.5 per hour, leak low at 6.9 L per minute at the 95th percentile, average usage of 7 hours and 36 minutes, pressure at 6 cm with EPR of 3.  I first met her on 07/15/2013, at which time she reported loud snoring, nonrestorative sleep, morning headaches and daytime somnolence. She also reported bruxism, for which she was using an over-the-counter bite guard. I advised her to return for sleep study. She is at baseline sleep study followed by a CPAP titration study. I went over her test results in detail with her today. Her baseline sleep study from 08/05/2013 showed a sleep efficiency of 74.9% with a latency to sleep of 68.5 minutes and wake after sleep onset of 39 minutes with mild to moderate sleep fragmentation noted. She had increased percentages of stage I and stage II sleep, I decreased percentage of slow-wave sleep and a mildly increased percentage of REM sleep with a normal REM latency. She had mild snoring with rare loud snoring noted. She had a total AHI of 5.4 per hour, rising to 17.4 per hour in REM sleep. Her baseline oxygen saturation was 92% with a nadir of 80%. Time below 88% saturation was 4 minutes and 4 seconds. She was requested to return for a CPAP titration study. She had this test on 09/11/2013. Sleep efficiency was 76.6% with a latency to sleep of 27 minutes and wake after sleep onset of 70 minutes with moderate sleep fragmentation noted. She had an increased percentage of stage II sleep, I decreased percentage of deep sleep, and a near normal percentage of REM sleep with a  mildly reduced REM latency. She had occasional PVCs and PACs on EKG. Of note, during the baseline sleep study she had similar EKG changes. Snoring was eliminated with CPAP. She was started on 5 cm and titrated to 7 cm. On 6 cm of pressure her AHI was 0 per hour. Pre-supine REM sleep was achieved. Based on the test results I prescribed CPAP for her.   I reviewed the patient's CPAP compliance data from 10/15/2013 to 11/13/2013, which is a total of 30 days, during which time the patient used CPAP every day. The average usage for all days was 9 hours and 1 minutes. The percent used days greater than 4 hours was 100%, indicating superb compliance. The residual AHI was 1.6 per hour, indicating an appropriate treatment pressure with very little leak   documented.   I reviewed her compliance data from 10/14/2013 through 11/24/2013 which is the last 42 days during which times he use CPAP every night with percent used days greater than 4 hours of 100%, indicating superb compliance. Average usage was 9 hours and 3 minutes with a residual AHI of 1.5/h. Leak was very low. Pressure is 6 cm with EPR of 3.   I reviewed the patient's CPAP compliance data from 01/14/2014 to 02/12/2014, which is a total of 30 days, during which time the patient used CPAP every day. The average usage for all days was 7 hours and 20 minutes. The percent used days greater than 4 hours was 90 %, indicating excellent compliance. The residual AHI was 1.3 per hour, indicating an appropriate treatment pressure of 6 cwp with EPR of 3. Air leak from the mask was low at 6.9 L per minute at the 95th percentile.  Her Past Medical History Is Significant For: Past Medical History:  Diagnosis Date  . Gout   . HTN (hypertension)   . OSA (obstructive sleep apnea) 07/15/2013  . Reflux   . Sinus infection     Her Past Surgical History Is Significant For: Past Surgical History:  Procedure Laterality Date  . COLONOSCOPY    . COLONOSCOPY [name] 08/31/2014    Procedure: COLONOSCOPY;  Surgeon: Robert M Rourk, MD;  Location: [facility];  Service: Endoscopy;  Laterality: [name];  8:30 AM  . Fybroid emoval      Her Family History Is Significant For: Family History  Problem Relation Age of Onset  . Diabetes Mother   . Thyroid disease Mother   . Heart failure Mother 88       Medication damage    Her Social History Is Significant For: Social History   Socioeconomic History  . Marital status: Single    Spouse name: Not on file  . Number of children: 1  . Years of education: Not on file  . Highest education level: Not on file  Occupational History  . Not on file  Tobacco Use  . Smoking status: Former Smoker    Packs/day: 0.50    Types: Cigarettes    Quit date: 07/15/1989    Years since quitting: 31.3  . Smokeless tobacco: Never Used  Vaping Use  . Vaping Use: Never used  Substance and Sexual Activity  . Alcohol use: No  . Drug use: No  . Sexual activity: Not on file  Other Topics Concern  . 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  Retired.     Social Determinants of Health   Financial Resource Strain: Not on file  Food Insecurity: Not on file  Transportation Needs: Not on file  Physical Activity: Not on file  Stress: Not on file  Social Connections: Not on file    Her Allergies Are:  No Known Allergies:   Her Current Medications Are:  Outpatient Encounter Medications as of 11/16/2020  Medication Sig  . allopurinol (ZYLOPRIM) 300 MG tablet Take 300 mg by mouth daily.  . amLODipine (NORVASC) 5 MG tablet Take 5 mg by mouth daily.  . aspirin EC 81 MG tablet Take 81 mg by mouth daily.  . Calcium Carbonate-Vitamin D (CALTRATE 600+D PO) Take 1 tablet by mouth daily.   . fluticasone (FLONASE) 50 MCG/ACT nasal spray Place 1-2 sprays into both nostrils as needed.   . ipratropium (ATROVENT) 0.06 % nasal spray SMARTSIG:2   Spray(s) Both Nares 3 Times Daily PRN  .  irbesartan (AVAPRO) 300 MG tablet Take 1 tablet by mouth daily.  . Multiple Vitamin (MULTIVITAMIN) capsule Take 1 capsule by mouth daily.  . omeprazole (PRILOSEC) 20 MG capsule Take 20 mg by mouth daily as needed. Takes only as needed  . Vitamin D, Cholecalciferol, 25 MCG (1000 UT) TABS Take by mouth.  . metoprolol tartrate (LOPRESSOR) 25 MG tablet Take 1 tablet (25 mg total) by mouth 2 (two) times daily.   No facility-administered encounter medications on file as of 11/16/2020.  :   Review of Systems:  Out of a complete 14 point review of systems, all are reviewed and negative with the exception of these symptoms as listed below:  Review of Systems  Neurological:       Here for re visit for CPAP. Reports she is still using her machine and reports no issues. Pt needed visit to get a refill on her supplies.     Objective:  Neurological Exam  Physical Exam Physical Examination:   Vitals:   11/16/20 1040  BP: 112/82  Pulse: (!) 59  SpO2: 97%    General Examination: The patient is a very pleasant 74 y.o. female in no acute distress. She appears well-developed and well-nourished and well groomed.   HEENT:Normocephalic, atraumatic, pupils are equal, round and reactive to light, corrective eyeglasses in place, status post cataract repairs.  Tracking is preserved, hearing grossly intact.  Face is symmetric with normal facial animation.  Speech is clear without dysarthria, hypophonia or voice tremor.  Airway examination reveals mild mouth dryness, tongue protrudes centrally and palate elevates symmetrically, moderate airway crowding.  No carotid bruits.  Chest:normal breath sounds, no wheezing, rhonchi or crackles noted.   Heart:S1+S2+0, regular and normal without murmurs, rubs or gallops noted.   Abdomen:Soft, non-tender and non-distended.  Extremities:There is no pitting edema in the distal lower extremities bilaterally.   Skin: Warm and dry without trophic changes noted.  There are no varicose veins.  Musculoskeletal: exam reveals no obvious joint deformities, tenderness or joint swelling.   Neurologically:  Mental status: The patient is awake, alert and oriented in all 4 spheres. Her memory, attention, language and knowledge are appropriate. There is no aphasia, agnosia, apraxia or anomia. Speech is clear with normal prosody and enunciation. Thought process is linear. Mood is congruent and affect is normal.  Cranial nerves are as described above under HEENT exam.  Motor exam: Normal bulk, strength and tone is noted. There is no drift, tremor or rebound. Romberg is negative. Fine motor skills are grossly intact in the UEs and LEs.  Cerebellar testing shows no dysmetria or intention tremor. There is no truncal or gait ataxia.  Sensory exam is intact to light touch in the upper and lower extremities.  Gait, station and balance are unremarkable. No veering to one side is noted. No leaning to one side is noted. Posture is age-appropriate and stance is narrow based.    Assessment and Plan:   In summary, Angelica Mcconnell is a very pleasant 74-year old female with an underlying medical history of obesity, hypertension, and reflux disease, who presents for reevaluation of her sleep apnea.  She has been on CPAP therapy for years.  She is working on weight loss and her weight is almost 10 pounds less compared to 3 years ago.  She is compliant with treatment and recently tried to skip every other night to see if she can go without treatment but overall   admits that she feels better with CPAP therapy.  She was found to have mild to moderate sleep apnea, sleep study testing was in December 2014.  She has been compliant with treatment and benefits from it.  She is commended for her treatment adherence, she will need new supplies and I have placed an updated prescription for supplies, we will fax this to her DME company, [facility]. I suggested a one-year checkup  routinely for sleep apnea, she can see one of our nurse practitioners routinely. I answered all her questions today and the patient was in agreement.   

## 2020-11-23 DIAGNOSIS — E785 Hyperlipidemia, unspecified: Secondary | ICD-10-CM | POA: Diagnosis not present

## 2020-11-23 DIAGNOSIS — E1122 Type 2 diabetes mellitus with diabetic chronic kidney disease: Secondary | ICD-10-CM | POA: Diagnosis not present

## 2020-11-30 DIAGNOSIS — K219 Gastro-esophageal reflux disease without esophagitis: Secondary | ICD-10-CM | POA: Diagnosis not present

## 2020-11-30 DIAGNOSIS — Z0000 Encounter for general adult medical examination without abnormal findings: Secondary | ICD-10-CM | POA: Diagnosis not present

## 2020-11-30 DIAGNOSIS — N1831 Chronic kidney disease, stage 3a: Secondary | ICD-10-CM | POA: Diagnosis not present

## 2020-11-30 DIAGNOSIS — E1122 Type 2 diabetes mellitus with diabetic chronic kidney disease: Secondary | ICD-10-CM | POA: Diagnosis not present

## 2020-11-30 DIAGNOSIS — E785 Hyperlipidemia, unspecified: Secondary | ICD-10-CM | POA: Diagnosis not present

## 2020-11-30 DIAGNOSIS — G4733 Obstructive sleep apnea (adult) (pediatric): Secondary | ICD-10-CM | POA: Diagnosis not present

## 2020-11-30 DIAGNOSIS — R82998 Other abnormal findings in urine: Secondary | ICD-10-CM | POA: Diagnosis not present

## 2020-11-30 DIAGNOSIS — I129 Hypertensive chronic kidney disease with stage 1 through stage 4 chronic kidney disease, or unspecified chronic kidney disease: Secondary | ICD-10-CM | POA: Diagnosis not present

## 2020-11-30 DIAGNOSIS — J309 Allergic rhinitis, unspecified: Secondary | ICD-10-CM | POA: Diagnosis not present

## 2020-12-07 DIAGNOSIS — G4733 Obstructive sleep apnea (adult) (pediatric): Secondary | ICD-10-CM | POA: Diagnosis not present

## 2021-02-11 DIAGNOSIS — Z20822 Contact with and (suspected) exposure to covid-19: Secondary | ICD-10-CM | POA: Diagnosis not present

## 2021-04-06 DIAGNOSIS — Z01419 Encounter for gynecological examination (general) (routine) without abnormal findings: Secondary | ICD-10-CM | POA: Diagnosis not present

## 2021-04-06 DIAGNOSIS — Z1231 Encounter for screening mammogram for malignant neoplasm of breast: Secondary | ICD-10-CM | POA: Diagnosis not present

## 2021-04-06 DIAGNOSIS — Z780 Asymptomatic menopausal state: Secondary | ICD-10-CM | POA: Diagnosis not present

## 2021-04-06 DIAGNOSIS — M85851 Other specified disorders of bone density and structure, right thigh: Secondary | ICD-10-CM | POA: Diagnosis not present

## 2021-04-12 DIAGNOSIS — N1831 Chronic kidney disease, stage 3a: Secondary | ICD-10-CM | POA: Diagnosis not present

## 2021-04-12 DIAGNOSIS — E785 Hyperlipidemia, unspecified: Secondary | ICD-10-CM | POA: Diagnosis not present

## 2021-04-12 DIAGNOSIS — M8580 Other specified disorders of bone density and structure, unspecified site: Secondary | ICD-10-CM | POA: Diagnosis not present

## 2021-04-12 DIAGNOSIS — G4733 Obstructive sleep apnea (adult) (pediatric): Secondary | ICD-10-CM | POA: Diagnosis not present

## 2021-04-12 DIAGNOSIS — J309 Allergic rhinitis, unspecified: Secondary | ICD-10-CM | POA: Diagnosis not present

## 2021-04-12 DIAGNOSIS — K219 Gastro-esophageal reflux disease without esophagitis: Secondary | ICD-10-CM | POA: Diagnosis not present

## 2021-04-12 DIAGNOSIS — I129 Hypertensive chronic kidney disease with stage 1 through stage 4 chronic kidney disease, or unspecified chronic kidney disease: Secondary | ICD-10-CM | POA: Diagnosis not present

## 2021-04-12 DIAGNOSIS — E1122 Type 2 diabetes mellitus with diabetic chronic kidney disease: Secondary | ICD-10-CM | POA: Diagnosis not present

## 2021-04-19 ENCOUNTER — Inpatient Hospital Stay
Admit: 2021-04-19 | Discharge: 2021-04-19 | Disposition: A | Discharge status: Other Acute Inpatient Hospital | Payer: MEDICARE | Attending: Emergency Medicine

## 2021-04-19 DIAGNOSIS — R569 Unspecified convulsions: Secondary | ICD-10-CM

## 2021-04-19 LAB — CBC WITH AUTOMATED DIFF
ABS. BASOPHILS: 0.1 10*3/uL (ref 0.0–0.1)
ABS. EOSINOPHILS: 0.1 10*3/uL (ref 0.0–0.4)
ABS. IMM. GRANS.: 0.1 10*3/uL — ABNORMAL HIGH (ref 0.00–0.04)
ABS. LYMPHOCYTES: 1.4 10*3/uL (ref 0.8–3.5)
ABS. MONOCYTES: 0.3 10*3/uL (ref 0.0–1.0)
ABS. NEUTROPHILS: 4.2 10*3/uL (ref 1.8–8.0)
ABSOLUTE NRBC: 0 10*3/uL (ref 0.00–0.01)
BASOPHILS: 1 % (ref 0–1)
EOSINOPHILS: 1 % (ref 0–7)
HCT: 39.8 % (ref 35.0–47.0)
HGB: 12.9 g/dL (ref 11.5–16.0)
IMMATURE GRANULOCYTES: 1 % — ABNORMAL HIGH (ref 0.0–0.5)
LYMPHOCYTES: 23 % (ref 12–49)
MCH: 29.8 PG (ref 26.0–34.0)
MCHC: 32.4 g/dL (ref 30.0–36.5)
MCV: 91.9 FL (ref 80.0–99.0)
MONOCYTES: 5 % (ref 5–13)
MPV: 10.4 FL (ref 8.9–12.9)
NEUTROPHILS: 69 % (ref 32–75)
NRBC: 0 PER 100 WBC
PLATELET COMMENTS: INCREASED
PLATELET: 811 10*3/uL — ABNORMAL HIGH (ref 150–400)
RBC: 4.33 M/uL (ref 3.80–5.20)
RDW: 14.1 % (ref 11.5–14.5)
WBC: 6.2 10*3/uL (ref 3.6–11.0)

## 2021-04-19 LAB — METABOLIC PANEL, COMPREHENSIVE
A-G Ratio: 0.8 — ABNORMAL LOW (ref 1.1–2.2)
ALT (SGPT): 16 U/L (ref 12–78)
AST (SGOT): 25 U/L (ref 15–37)
Albumin: 3.7 g/dL (ref 3.5–5.0)
Alk. phosphatase: 51 U/L (ref 45–117)
Anion gap: 5 mmol/L (ref 5–15)
BUN/Creatinine ratio: 13 (ref 12–20)
BUN: 13 MG/DL (ref 6–20)
Bilirubin, total: 0.3 MG/DL (ref 0.2–1.0)
CO2: 29 mmol/L (ref 21–32)
Calcium: 9.6 MG/DL (ref 8.5–10.1)
Chloride: 101 mmol/L (ref 97–108)
Creatinine: 1.03 MG/DL — ABNORMAL HIGH (ref 0.55–1.02)
GFR est AA: >60 mL/min/{1.73_m2} (ref >60)
GFR est non-AA: 53 mL/min/{1.73_m2} — ABNORMAL LOW (ref >60)
Globulin: 4.4 g/dL — ABNORMAL HIGH (ref 2.0–4.0)
Glucose: 94 mg/dL (ref 65–100)
Potassium: 4.3 mmol/L (ref 3.5–5.1)
Protein, total: 8.1 g/dL (ref 6.4–8.2)
Sodium: 135 mmol/L — ABNORMAL LOW (ref 136–145)

## 2021-04-19 LAB — VALPROIC ACID
Valproic Acid: 30 ug/ml — ABNORMAL LOW (ref 50–100)
Valproic acid: 30 ug/ml — ABNORMAL LOW (ref 50–100)

## 2021-04-19 LAB — COMPREHENSIVE METABOLIC PANEL
ALT: 16 U/L (ref 12–78)
AST: 25 U/L (ref 15–37)
Albumin/Globulin Ratio: 0.8 — ABNORMAL LOW (ref 1.1–2.2)
Albumin: 3.7 g/dL (ref 3.5–5.0)
Alkaline Phosphatase: 51 U/L (ref 45–117)
Anion Gap: 5 mmol/L (ref 5–15)
BUN: 13 MG/DL (ref 6–20)
Bun/Cre Ratio: 13 (ref 12–20)
CO2: 29 mmol/L (ref 21–32)
Calcium: 9.6 MG/DL (ref 8.5–10.1)
Chloride: 101 mmol/L (ref 97–108)
Creatinine: 1.03 MG/DL — ABNORMAL HIGH (ref 0.55–1.02)
EGFR IF NonAfrican American: 53 mL/min/{1.73_m2} — ABNORMAL LOW (ref >60)
GFR African American: >60 mL/min/{1.73_m2} (ref >60)
Globulin: 4.4 g/dL — ABNORMAL HIGH (ref 2.0–4.0)
Glucose: 94 mg/dL (ref 65–100)
Potassium: 4.3 mmol/L (ref 3.5–5.1)
Sodium: 135 mmol/L — ABNORMAL LOW (ref 136–145)
Total Bilirubin: 0.3 MG/DL (ref 0.2–1.0)
Total Protein: 8.1 g/dL (ref 6.4–8.2)

## 2021-04-19 LAB — CBC WITH AUTO DIFFERENTIAL
Basophils %: 1 % (ref 0–1)
Basophils Absolute: 0.1 10*3/uL (ref 0.0–0.1)
Eosinophils %: 1 % (ref 0–7)
Eosinophils Absolute: 0.1 10*3/uL (ref 0.0–0.4)
Granulocyte Absolute Count: 0.1 10*3/uL — ABNORMAL HIGH (ref 0.00–0.04)
Hematocrit: 39.8 % (ref 35.0–47.0)
Hemoglobin: 12.9 g/dL (ref 11.5–16.0)
Immature Granulocytes: 1 % — ABNORMAL HIGH (ref 0.0–0.5)
Lymphocytes %: 23 % (ref 12–49)
Lymphocytes Absolute: 1.4 10*3/uL (ref 0.8–3.5)
MCH: 29.8 PG (ref 26.0–34.0)
MCHC: 32.4 g/dL (ref 30.0–36.5)
MCV: 91.9 FL (ref 80.0–99.0)
MPV: 10.4 FL (ref 8.9–12.9)
Monocytes %: 5 % (ref 5–13)
Monocytes Absolute: 0.3 10*3/uL (ref 0.0–1.0)
NRBC Absolute: 0 10*3/uL (ref 0.00–0.01)
Neutrophils %: 69 % (ref 32–75)
Neutrophils Absolute: 4.2 10*3/uL (ref 1.8–8.0)
Nucleated RBCs: 0 PER 100 WBC
Platelet Comment: INCREASED
Platelets: 811 10*3/uL — ABNORMAL HIGH (ref 150–400)
RBC: 4.33 M/uL (ref 3.80–5.20)
RDW: 14.1 % (ref 11.5–14.5)
WBC: 6.2 10*3/uL (ref 3.6–11.0)

## 2021-04-19 MED ORDER — SODIUM CHLORIDE 0.9 % IV
500 mg/5 mL (100 mg/mL) | INTRAVENOUS | Status: AC
Start: 2021-04-19 — End: 2021-04-19
  Administered 2021-04-19: 15:00:00 via INTRAVENOUS

## 2021-04-19 MED FILL — VALPROATE SODIUM 100 MG/ML IV: 500 mg/5 mL (100 mg/mL) | INTRAVENOUS | Qty: 5

## 2021-04-19 NOTE — ED Notes (Signed)
Advised Dr. Luberta Robertson that IVs have been attempted 2 times, Meghan [name] will attempt IV.

## 2021-04-19 NOTE — ED Notes (Signed)
[facility] phone number [phone].

## 2021-04-19 NOTE — ED Notes (Signed)
I requested transportation via [facility], spoke with [place] and ETA for pick up is 1445.  I called [facility] and advised Nurse Hardie Pulley re: pt discharge and transportation.  She didn't have any questions, she thanked me for the call.

## 2021-04-19 NOTE — ED Notes (Signed)
[facility] here to transport pt to [facility]. Pt denies any complaints and is stable for transfer.
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
## 2021-04-20 LAB — EKG, 12 LEAD, INITIAL
Atrial Rate: 77 {beats}/min
Calculated P Axis: 54 degrees
Calculated R Axis: 18 degrees
Calculated T Axis: 46 degrees
Diagnosis: NORMAL
P-R Interval: 152 ms
Q-T Interval: 374 ms
QRS Duration: 78 ms
QTC Calculation (Bezet): 423 ms
Ventricular Rate: 77 {beats}/min

## 2021-04-20 LAB — EKG 12-LEAD
Atrial Rate: 77 {beats}/min
Diagnosis: NORMAL
P Axis: 54 degrees
P-R Interval: 152 ms
Q-T Interval: 374 ms
QRS Duration: 78 ms
QTc Calculation (Bazett): 423 ms
R Axis: 18 degrees
T Axis: 46 degrees
Ventricular Rate: 77 {beats}/min

## 2021-05-05 DIAGNOSIS — E113393 Type 2 diabetes mellitus with moderate nonproliferative diabetic retinopathy without macular edema, bilateral: Secondary | ICD-10-CM | POA: Diagnosis not present

## 2021-06-11 DIAGNOSIS — Z23 Encounter for immunization: Secondary | ICD-10-CM | POA: Diagnosis not present

## 2021-08-31 DIAGNOSIS — G4733 Obstructive sleep apnea (adult) (pediatric): Secondary | ICD-10-CM | POA: Diagnosis not present

## 2021-08-31 DIAGNOSIS — M109 Gout, unspecified: Secondary | ICD-10-CM | POA: Diagnosis not present

## 2021-08-31 DIAGNOSIS — I129 Hypertensive chronic kidney disease with stage 1 through stage 4 chronic kidney disease, or unspecified chronic kidney disease: Secondary | ICD-10-CM | POA: Diagnosis not present

## 2021-08-31 DIAGNOSIS — N1831 Chronic kidney disease, stage 3a: Secondary | ICD-10-CM | POA: Diagnosis not present

## 2021-08-31 DIAGNOSIS — J309 Allergic rhinitis, unspecified: Secondary | ICD-10-CM | POA: Diagnosis not present

## 2021-08-31 DIAGNOSIS — K219 Gastro-esophageal reflux disease without esophagitis: Secondary | ICD-10-CM | POA: Diagnosis not present

## 2021-08-31 DIAGNOSIS — E785 Hyperlipidemia, unspecified: Secondary | ICD-10-CM | POA: Diagnosis not present

## 2021-08-31 DIAGNOSIS — E1122 Type 2 diabetes mellitus with diabetic chronic kidney disease: Secondary | ICD-10-CM | POA: Diagnosis not present

## 2021-10-28 ENCOUNTER — Other Ambulatory Visit (HOSPITAL_COMMUNITY): Payer: Self-pay | Admitting: Internal Medicine

## 2021-10-28 DIAGNOSIS — Z1231 Encounter for screening mammogram for malignant neoplasm of breast: Secondary | ICD-10-CM

## 2021-10-31 ENCOUNTER — Other Ambulatory Visit: Payer: Self-pay

## 2021-10-31 ENCOUNTER — Ambulatory Visit (HOSPITAL_COMMUNITY)
Admission: RE | Admit: 2021-10-31 | Discharge: 2021-10-31 | Disposition: A | Discharge status: Home / Self Care | Payer: Medicare HMO | Source: Ambulatory Visit | Attending: Internal Medicine | Admitting: Internal Medicine

## 2021-10-31 DIAGNOSIS — Z1231 Encounter for screening mammogram for malignant neoplasm of breast: Secondary | ICD-10-CM | POA: Diagnosis not present

## 2021-11-09 ENCOUNTER — Ambulatory Visit: Payer: Self-pay

## 2021-11-09 ENCOUNTER — Other Ambulatory Visit: Payer: Self-pay

## 2021-11-09 ENCOUNTER — Ambulatory Visit
Admission: EM | Admit: 2021-11-09 | Discharge: 2021-11-09 | Disposition: A | Discharge status: Home / Self Care | Payer: Medicare HMO | Attending: Family Medicine | Admitting: Family Medicine

## 2021-11-09 DIAGNOSIS — J069 Acute upper respiratory infection, unspecified: Secondary | ICD-10-CM

## 2021-11-09 DIAGNOSIS — Z20828 Contact with and (suspected) exposure to other viral communicable diseases: Secondary | ICD-10-CM | POA: Diagnosis not present

## 2021-11-09 MED ORDER — DEXAMETHASONE SODIUM PHOSPHATE 10 MG/ML IJ SOLN
10.0000 mg | Freq: Once | INTRAMUSCULAR | Status: AC
  Administered 2021-11-09: 10 mg via INTRAMUSCULAR

## 2021-11-09 MED ORDER — PROMETHAZINE-DM 6.25-15 MG/5ML PO SYRP: 5.0000 mL | ORAL_SOLUTION | Freq: Four times a day (QID) | ORAL | 0 refills | Status: DC | PRN

## 2021-11-09 NOTE — ED Provider Notes (Signed)
?[facility] ? ? ? ?CSN: [id number] ?Arrival date & time: 11/09/21  1239 ? ? ?  ? ?History   ?Chief Complaint ?Chief Complaint  ?Patient presents with  ? Sore Throat  ?  Cough, sore throat and headache  ? ? ?HPI ?Angelica Mcconnell is a 76 y.o. female.  ? ?Presenting today with 2-day history of dry hacking cough, right ear pain, sore scratchy throat, nasal congestion, low-grade fever, sinus headache.  Denies chest pain, shortness of breath, abdominal pain, nausea vomiting or diarrhea.  So far taking her typical allergy regimen of Zyrtec and Flonase, try Mucinex DM with no relief.  No known history of chronic pulmonary disease.  Multiple sick contacts recently but unsure with what. ? ? ?Past Medical History:  ?Diagnosis Date  ? Gout   ? HTN (hypertension)   ? OSA (obstructive sleep apnea) 07/15/2013  ? Reflux   ? Sinus infection   ? ? ?Patient Active Problem List  ? Diagnosis Date Noted  ? Diverticulosis of colon without hemorrhage   ? OSA (obstructive sleep apnea) 07/15/2013  ? ? ?Past Surgical History:  ?Procedure Laterality Date  ? COLONOSCOPY    ? COLONOSCOPY [name] 08/31/2014  ? Procedure: COLONOSCOPY;  Surgeon: Daneil Dolin, MD;  Location: [facility];  Service: Endoscopy;  Laterality: [name];  8:30 AM  ? Fybroid emoval    ? ? ?OB History   ?No obstetric history on file. ?  ? ? ? ?Home Medications   ? ?Prior to Admission medications   ?Medication Sig Start Date End Date Taking? Authorizing Provider  ?promethazine-dextromethorphan (PROMETHAZINE-DM) 6.25-15 MG/5ML syrup Take 5 mLs by mouth 4 (four) times daily as needed. 11/09/21  Yes Volney American, PA-C  ?allopurinol (ZYLOPRIM) 300 MG tablet Take 300 mg by mouth daily. 07/07/19   [provider]  ?amLODipine (NORVASC) 5 MG tablet Take 5 mg by mouth daily.    [provider]  ?aspirin EC 81 MG tablet Take 81 mg by mouth daily.    [provider]  ?Calcium Carbonate-Vitamin D (CALTRATE 600+D PO) Take 1 tablet by mouth  daily.     [provider]  ?fluticasone (FLONASE) 50 MCG/ACT nasal spray Place 1-2 sprays into both nostrils as needed.  06/06/13   [provider]  ?ipratropium (ATROVENT) 0.06 % nasal spray SMARTSIG:2 Spray(s) Both Nares 3 Times Daily PRN 04/02/19   [provider]  ?irbesartan (AVAPRO) 300 MG tablet Take 1 tablet by mouth daily. 07/09/13   [provider]  ?metoprolol tartrate (LOPRESSOR) 25 MG tablet Take 1 tablet (25 mg total) by mouth 2 (two) times daily. 08/19/19 11/17/19  Adrian Prows, MD  ?Multiple Vitamin (MULTIVITAMIN) capsule Take 1 capsule by mouth daily.    [provider]  ?omeprazole (PRILOSEC) 20 MG capsule Take 20 mg by mouth daily as needed. Takes only as needed 05/16/13   [provider]  ?Vitamin D, Cholecalciferol, 25 MCG (1000 UT) TABS Take by mouth.    [provider]  ? ? ?Family History ?Family History  ?Problem Relation Age of Onset  ? Diabetes Mother   ? Thyroid disease Mother   ? Heart failure Mother 27  ?     Medication damage  ? ? ?Social History ?Social History  ? ?Tobacco Use  ? Smoking status: Former  ?  Packs/day: 0.50  ?  Types: Cigarettes  ?  Quit date: 07/15/1989  ?  Years since quitting: 32.3  ?  Passive exposure: Never  ?  Smokeless tobacco: Never  ?Vaping Use  ? Vaping Use: Never used  ?Substance Use Topics  ? Alcohol use: No  ? Drug use: No  ? ? ? ?Allergies   ?Patient has no known allergies. ? ? ?Review of Systems ?Review of Systems ?Per HPI ? ?Physical Exam ?Triage Vital Signs ?ED Triage Vitals  ?Enc Vitals Group  ?   BP 11/09/21 1309 113/76  ?   Pulse Rate 11/09/21 1309 80  ?   Resp 11/09/21 1309 16  ?   Temp 11/09/21 1309 98.8 ?F (37.1 ?C)  ?   Temp Source 11/09/21 1309 Oral  ?   SpO2 11/09/21 1309 96 %  ?   Weight --   ?   Height --   ?   Head Circumference --   ?   Peak Flow --   ?   Pain Score 11/09/21 1305 7  ?   Pain Loc --   ?   Pain Edu? --   ?   Excl. in [place]? --   ? ?No data found. ? ?Updated Vital Signs ?BP  113/76 (BP Location: Right Arm)   Pulse 80   Temp 98.8 ?F (37.1 ?C) (Oral)   Resp 16   SpO2 96%  ? ?Visual Acuity ?Right Eye Distance:   ?Left Eye Distance:   ?Bilateral Distance:   ? ?Right Eye Near:   ?Left Eye Near:    ?Bilateral Near:    ? ?Physical Exam ?Vitals and nursing note reviewed.  ?Constitutional:   ?   Appearance: Normal appearance.  ?HENT:  ?   Head: Atraumatic.  ?   Right Ear: Tympanic membrane and external ear normal.  ?   Left Ear: Tympanic membrane and external ear normal.  ?   Nose: Rhinorrhea present.  ?   Mouth/Throat:  ?   Mouth: Mucous membranes are moist.  ?   Pharynx: Posterior oropharyngeal erythema present.  ?Eyes:  ?   Extraocular Movements: Extraocular movements intact.  ?   Conjunctiva/sclera: Conjunctivae normal.  ?Cardiovascular:  ?   Rate and Rhythm: Normal rate and regular rhythm.  ?   Heart sounds: Normal heart sounds.  ?Pulmonary:  ?   Effort: Pulmonary effort is normal.  ?   Breath sounds: Normal breath sounds. No wheezing or rales.  ?Musculoskeletal:     ?   General: Normal range of motion.  ?   Cervical back: Normal range of motion and neck supple.  ?Skin: ?   General: Skin is warm and dry.  ?Neurological:  ?   Mental Status: She is alert and oriented to person, place, and time.  ?Psychiatric:     ?   Mood and Affect: Mood normal.     ?   Thought Content: Thought content normal.  ? ? ? ?UC Treatments / Results  ?Labs ?(all labs ordered are listed, but only abnormal results are displayed) ?Labs Reviewed  ?COVID-19, FLU A+B NAA  ? ? ?EKG ? ? ?Radiology ?No results found. ? ?Procedures ?Procedures (including critical care time) ? ?Medications Ordered in UC ?Medications  ?dexamethasone (DECADRON) injection 10 mg (10 mg Intramuscular Given 11/09/21 1355)  ? ? ?Initial Impression / Assessment and Plan / UC Course  ?I have reviewed the triage vital signs and the nursing notes. ? ?Pertinent labs & imaging results that were available during my care of the patient were reviewed by me  and considered in my medical decision making (see chart for details). ? ?  ? ?Vital  signs reassuring today, COVID and flu pending, treat with IM Decadron, Phenergan DM, continued allergy regimen and supportive medications and home care.  Return for acutely worsening symptoms. ? ?Final Clinical Impressions(s) / UC Diagnoses  ? ?Final diagnoses:  ?Exposure to the flu  ?Viral URI with cough  ? ?Discharge Instructions   ?None ?  ? ?ED Prescriptions   ? ? Medication Sig Dispense Auth. Provider  ? promethazine-dextromethorphan (PROMETHAZINE-DM) 6.25-15 MG/5ML syrup Take 5 mLs by mouth 4 (four) times daily as needed. 100 mL Volney American, [place]  ? ?  ? ?PDMP not reviewed this encounter. ?  ?Volney American, PA-C ?11/09/21 1357 ? ?

## 2021-11-09 NOTE — ED Triage Notes (Signed)
Pt states she started with a dry cough and some pain in her right ear and her throat is hurting  and a low grade fever since Monday ? ?Pt states she has pain over her eyes like its a sinus headache ? ?Pt states she has tried Mucinex DM and it was helping but she wanted to be checked because of her age ?

## 2021-11-11 LAB — COVID-19, FLU A+B NAA
Influenza A, NAA: NOT DETECTED
Influenza B, NAA: NOT DETECTED
SARS-CoV-2, NAA: DETECTED — AB

## 2021-11-17 ENCOUNTER — Ambulatory Visit: Payer: Medicare PPO | Admitting: Family Medicine

## 2021-11-24 ENCOUNTER — Ambulatory Visit: Payer: Medicare PPO | Admitting: Family Medicine

## 2021-11-24 DIAGNOSIS — H40053 Ocular hypertension, bilateral: Secondary | ICD-10-CM | POA: Diagnosis not present

## 2021-11-30 DIAGNOSIS — F331 Major depressive disorder, recurrent, moderate: Secondary | ICD-10-CM | POA: Diagnosis not present

## 2021-11-30 DIAGNOSIS — F0390 Unspecified dementia without behavioral disturbance: Secondary | ICD-10-CM | POA: Diagnosis not present

## 2021-12-14 DIAGNOSIS — E1122 Type 2 diabetes mellitus with diabetic chronic kidney disease: Secondary | ICD-10-CM | POA: Diagnosis not present

## 2021-12-14 DIAGNOSIS — E785 Hyperlipidemia, unspecified: Secondary | ICD-10-CM | POA: Diagnosis not present

## 2021-12-14 DIAGNOSIS — M109 Gout, unspecified: Secondary | ICD-10-CM | POA: Diagnosis not present

## 2021-12-21 DIAGNOSIS — R059 Cough, unspecified: Secondary | ICD-10-CM | POA: Diagnosis not present

## 2021-12-21 DIAGNOSIS — M8580 Other specified disorders of bone density and structure, unspecified site: Secondary | ICD-10-CM | POA: Diagnosis not present

## 2021-12-21 DIAGNOSIS — R82998 Other abnormal findings in urine: Secondary | ICD-10-CM | POA: Diagnosis not present

## 2021-12-21 DIAGNOSIS — M109 Gout, unspecified: Secondary | ICD-10-CM | POA: Diagnosis not present

## 2021-12-21 DIAGNOSIS — Z0000 Encounter for general adult medical examination without abnormal findings: Secondary | ICD-10-CM | POA: Diagnosis not present

## 2021-12-21 DIAGNOSIS — I129 Hypertensive chronic kidney disease with stage 1 through stage 4 chronic kidney disease, or unspecified chronic kidney disease: Secondary | ICD-10-CM | POA: Diagnosis not present

## 2021-12-21 DIAGNOSIS — Z1389 Encounter for screening for other disorder: Secondary | ICD-10-CM | POA: Diagnosis not present

## 2021-12-21 DIAGNOSIS — E1122 Type 2 diabetes mellitus with diabetic chronic kidney disease: Secondary | ICD-10-CM | POA: Diagnosis not present

## 2021-12-21 DIAGNOSIS — E785 Hyperlipidemia, unspecified: Secondary | ICD-10-CM | POA: Diagnosis not present

## 2021-12-21 DIAGNOSIS — Z1331 Encounter for screening for depression: Secondary | ICD-10-CM | POA: Diagnosis not present

## 2021-12-21 DIAGNOSIS — D72829 Elevated white blood cell count, unspecified: Secondary | ICD-10-CM | POA: Diagnosis not present

## 2021-12-28 DIAGNOSIS — F0390 Unspecified dementia without behavioral disturbance: Secondary | ICD-10-CM | POA: Diagnosis not present

## 2021-12-28 DIAGNOSIS — F321 Major depressive disorder, single episode, moderate: Secondary | ICD-10-CM | POA: Diagnosis not present

## 2022-02-01 DIAGNOSIS — F32A Depression, unspecified: Secondary | ICD-10-CM | POA: Diagnosis not present

## 2022-02-01 DIAGNOSIS — F03A0 Unspecified dementia, mild, without behavioral disturbance, psychotic disturbance, mood disturbance, and anxiety: Secondary | ICD-10-CM | POA: Diagnosis not present

## 2022-03-06 ENCOUNTER — Encounter: Payer: Self-pay | Admitting: Family Medicine

## 2022-03-06 ENCOUNTER — Ambulatory Visit: Payer: Medicare HMO | Admitting: Family Medicine

## 2022-03-06 VITALS — BP 136/80 | HR 68 | Ht 65.0 in | Wt 247.0 lb

## 2022-03-06 DIAGNOSIS — G4733 Obstructive sleep apnea (adult) (pediatric): Secondary | ICD-10-CM

## 2022-03-06 NOTE — Progress Notes (Signed)
PATIENT: Angelica Mcconnell DOB: June 21, 1946  REASON FOR VISIT: follow up HISTORY FROM: patient  Chief Complaint  Patient presents with   Follow-up    Pt alone, rm 16, DME was [place] but d/t insurance change they no longer accept her insurance. Pt was set up 10/2013 with current machine. She didn't bring card with her cause it had previously been reading wireless      HISTORY OF PRESENT ILLNESS:  03/06/22 ALL:  Angelica Mcconnell is a 76 y.o. female here today for follow up for OSA on CPAP. She reports doing well on therapy. Her machine was set up in 2015. She has not been able to get new supplies as she is no longer in network with [employer]. We do not have data to review, today. She reports using CPAP nearly every night. She denies concerns with machine and feels it helps her rest.   HISTORY: (copied from Dr Teofilo Pod previous note)  Angelica Mcconnell is a 76 year old right-handed woman with an underlying medical history of obesity, hypertension, prior smoking, reflux disease, who presents for re-evaluation of her obstructive sleep apnea after a long gap. The patient is unaccompanied today and presents to reestablish care. She was last seen in this clinic nearly 3  years ago, on 06/05/17, at which time she was compliant with her CPAP and endorsed ongoing good results.    Today, 11/16/2020: I reviewed her CPAP compliance data from 10/13/2020 through 11/11/2020, which is a total of 30 days, during which time she used her machine 22 days with percent use days greater than 4 hours at 70%, indicating adequate compliance with an average usage of 8 hours/days on treatment, residual AHI at goal at 0.5/h, leak on the low side with a 95th percentile at 0.2 L/min on a pressure of 6 cm with EPR of 3.  She reports doing well with her CPAP.  She tried to skip every other night to see if she can go without treatment but overall feels better with treatment and has been using her machine more  consistently.  She does need new supplies, DME company is [employer].  She was informed that she will need a new prescription.  She goes to bed generally between 11 PM or midnight, she lives alone, rise time varies, she will listen to her pastor between 6 and 8 on the phone and after prior she will often stay in bed and may doze off until 9 AM typically.  She had 1 new prescription for her blood pressure, she has a physical coming up later this month with blood work a week prior.  She has had some allergy issues, uses over-the-counter nasal spray and allergy medicine.  She does not drink caffeine on a day-to-day basis owing to reflux issues and also blood pressure.   REVIEW OF SYSTEMS: Out of a complete 14 system review of symptoms, the patient complains only of the following symptoms, none and all other reviewed systems are negative.  ESS: 0/24  ALLERGIES: No Known Allergies  HOME MEDICATIONS: Outpatient Medications Prior to Visit  Medication Sig Dispense Refill   allopurinol (ZYLOPRIM) 300 MG tablet Take 300 mg by mouth daily.     amLODipine (NORVASC) 5 MG tablet Take 5 mg by mouth daily.     aspirin EC 81 MG tablet Take 81 mg by mouth daily.     Calcium Carbonate-Vitamin D (CALTRATE 600+D PO) Take 1 tablet by mouth daily.      fluticasone (FLONASE)  50 MCG/ACT nasal spray Place 1-2 sprays into both nostrils as needed.      ipratropium (ATROVENT) 0.06 % nasal spray SMARTSIG:2 Spray(s) Both Nares 3 Times Daily PRN     irbesartan (AVAPRO) 300 MG tablet Take 1 tablet by mouth daily.     Multiple Vitamin (MULTIVITAMIN) capsule Take 1 capsule by mouth daily.     omeprazole (PRILOSEC) 20 MG capsule Take 20 mg by mouth daily as needed. Takes only as needed     promethazine-dextromethorphan (PROMETHAZINE-DM) 6.25-15 MG/5ML syrup Take 5 mLs by mouth 4 (four) times daily as needed. 100 mL 0   Vitamin D, Cholecalciferol, 25 MCG (1000 UT) TABS Take by mouth.     metoprolol tartrate (LOPRESSOR)  25 MG tablet Take 1 tablet (25 mg total) by mouth 2 (two) times daily. 60 tablet 2   No facility-administered medications prior to visit.    PAST MEDICAL HISTORY: Past Medical History:  Diagnosis Date   Gout    HTN (hypertension)    OSA (obstructive sleep apnea) 07/15/2013   Reflux    Sinus infection     PAST SURGICAL HISTORY: Past Surgical History:  Procedure Laterality Date   COLONOSCOPY     COLONOSCOPY [name] 08/31/2014   Procedure: COLONOSCOPY;  Surgeon: Daneil Dolin, MD;  Location: [facility];  Service: Endoscopy;  Laterality: [name];  8:30 AM   Fybroid emoval      FAMILY HISTORY: Family History  Problem Relation Age of Onset   Diabetes Mother    Thyroid disease Mother    Heart failure Mother 13       Medication damage    SOCIAL HISTORY: Social History   Socioeconomic History   Marital status: Single    Spouse name: Not on file   Number of children: 1   Years of education: Not on file   Highest education level: Not on file  Occupational History   Not on file  Tobacco Use   Smoking status: Former    Packs/day: 0.50    Types: Cigarettes    Quit date: 07/15/1989    Years since quitting: 32.6    Passive exposure: Never   Smokeless tobacco: Never  Vaping Use   Vaping Use: Never used  Substance and Sexual Activity   Alcohol use: No   Drug use: No   Sexual activity: Not Currently    Birth control/protection: None  Other Topics Concern   Not on file  Social History Narrative   Right handed, Caffeine 1-2 monthly, Single, 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  Retired.     Social Determinants of Health   Financial Resource Strain: Not on file  Food Insecurity: Not on file  Transportation Needs: Not on file  Physical Activity: Not on file  Stress: Not on file  Social Connections: Not on file  Intimate Partner Violence: Not on file     PHYSICAL EXAM  Vitals:   03/06/22 0928  BP: 136/80  Pulse: 68  Weight: 247 lb (112 kg)  Height: 5\' 5"  (1.651 m)   Body mass  index is 41.1 kg/m.  Generalized: Well developed, in no acute distress  Cardiology: normal rate and rhythm, no murmur noted Respiratory: clear to auscultation bilaterally  Neurological examination  Mentation: Alert oriented to time, place, history taking. Follows all commands speech and language fluent Cranial nerve II-XII: Pupils were equal round reactive to light. Extraocular movements were full, visual field were full  Motor: The motor testing reveals 5 over 5 strength of  all 4 extremities. Good symmetric motor tone is noted throughout.  Gait and station: Gait is normal.    DIAGNOSTIC DATA (LABS, IMAGING, TESTING) - I reviewed patient records, labs, notes, testing and imaging myself where available.      No data to display           No results found for: "WBC", "HGB", "HCT", "MCV", "PLT" No results found for: "NA", "K", "CL", "CO2", "GLUCOSE", "BUN", "CREATININE", "CALCIUM", "PROT", "ALBUMIN", "AST", "ALT", "ALKPHOS", "BILITOT", "GFRNONAA", "GFRAA" No results found for: "CHOL", "HDL", "LDLCALC", "LDLDIRECT", "TRIG", "CHOLHDL" No results found for: "HGBA1C" No results found for: "VITAMINB12" No results found for: "TSH"   ASSESSMENT AND PLAN 76 y.o. year old female  has a past medical history of Gout, HTN (hypertension), OSA (obstructive sleep apnea) (07/15/2013), Reflux, and Sinus infection. here with     ICD-10-CM   1. OSA (obstructive sleep apnea)  G47.33 Home sleep test        TRINITEE HORGAN is doing well on CPAP therapy. We do not have compliance report for review, today. We have reached out to DME and will addend notes once complaince data is received. She was encouraged to continue using CPAP nightly and for greater than 4 hours each night. We will repeat HST then order a new CPAP to DME in network. Risks of untreated sleep apnea review and education materials provided. Healthy lifestyle habits encouraged. She will follow up 31-90 days following set up of new CPAP.  She verbalizes understanding and agreement with this plan.    Orders Placed This Encounter  Procedures   Home sleep test    Standing Status:   Future    Standing Expiration Date:   03/07/2023    Order Specific Question:   Where should this test be performed:    Answer:   [facility]     No orders of the defined types were placed in this encounter.     Shawnie Dapper, FNP-C 03/06/2022, 10:00 AM [facility] [address], [address] [place], [place] [address] [phone]

## 2022-03-06 NOTE — Patient Instructions (Addendum)
Please continue using your CPAP regularly. While your insurance requires that you use CPAP at least 4 hours each night on 70% of the nights, I recommend, that you not skip any nights and use it throughout the night if you can. Getting used to CPAP and staying with the treatment long term does take time and patience and discipline. Untreated obstructive sleep apnea when it is moderate to severe can have an adverse impact on cardiovascular health and raise her risk for heart disease, arrhythmias, hypertension, congestive heart failure, stroke and diabetes. Untreated obstructive sleep apnea causes sleep disruption, nonrestorative sleep, and sleep deprivation. This can have an impact on your day to day functioning and cause daytime sleepiness and impairment of cognitive function, memory loss, mood disturbance, and problems focussing. Using CPAP regularly can improve these symptoms.  We will order a repeat home sleep study. Once completed, we will order a new CPAP machine. Continue using your machine for now. We will try to get download from [facility]. We may need you to bring your CPAP machine by [place] to download.   Follow up in 31-90 days following set up of your new CPAP.

## 2022-03-07 ENCOUNTER — Telehealth: Payer: Self-pay

## 2022-03-07 NOTE — Telephone Encounter (Signed)
I have reviewed her compliance report dated 12/07/2021-03/06/2022 shows she used CPAP 83/90 days for 92% and greater than 4 hours 82/90 hours for 91%. Average usage 7hr 48 min. Residual AHI 0.4/hr on 6cmH20. No significant leak.

## 2022-03-07 NOTE — Telephone Encounter (Addendum)
Received compliance data from [facility] for 90 day report. Report placed in NP inbox for review.

## 2022-03-21 ENCOUNTER — Telehealth: Payer: Self-pay

## 2022-03-21 NOTE — Telephone Encounter (Signed)
LVM for pt to call back to schedule sleep study.  

## 2022-03-24 ENCOUNTER — Emergency Department: Admit: 2022-03-25 | Payer: MEDICARE | Primary: Internal Medicine

## 2022-03-24 ENCOUNTER — Inpatient Hospital Stay
Admit: 2022-03-24 | Discharge: 2022-03-25 | Disposition: A | Discharge status: Home / Self Care | Payer: MEDICARE | Attending: Student in an Organized Health Care Education/Training Program

## 2022-03-24 DIAGNOSIS — S0990XA Unspecified injury of head, initial encounter: Secondary | ICD-10-CM

## 2022-03-24 LAB — CBC WITH AUTO DIFFERENTIAL
Absolute Immature Granulocyte: 0 10*3/uL (ref 0.00–0.04)
Basophils %: 1 % (ref 0–1)
Basophils Absolute: 0.1 10*3/uL (ref 0.0–0.1)
Eosinophils %: 4 % (ref 0–7)
Eosinophils Absolute: 0.3 10*3/uL (ref 0.0–0.4)
Hematocrit: 43.1 % (ref 35.0–47.0)
Hemoglobin: 13.8 g/dL (ref 11.5–16.0)
Immature Granulocytes: 0 % (ref 0–0.5)
Lymphocytes %: 18 % (ref 12–49)
Lymphocytes Absolute: 1.5 10*3/uL (ref 0.8–3.5)
MCH: 30.6 PG (ref 26.0–34.0)
MCHC: 32 g/dL (ref 30.0–36.5)
MCV: 95.6 FL (ref 80.0–99.0)
MPV: 10.3 FL (ref 8.9–12.9)
Monocytes %: 13 % (ref 5–13)
Monocytes Absolute: 1.1 10*3/uL — ABNORMAL HIGH (ref 0.0–1.0)
Neutrophils %: 64 % (ref 32–75)
Neutrophils Absolute: 5.5 10*3/uL (ref 1.8–8.0)
Nucleated RBCs: 0 PER 100 WBC
Platelets: 909 10*3/uL — ABNORMAL HIGH (ref 150–400)
RBC: 4.51 M/uL (ref 3.80–5.20)
RDW: 14.9 % — ABNORMAL HIGH (ref 11.5–14.5)
WBC: 8.5 10*3/uL (ref 3.6–11.0)
nRBC: 0 10*3/uL (ref 0.00–0.01)

## 2022-03-24 NOTE — ED Notes (Signed)
Attempted to call pt caregiver to provide an update and ask questions, went to voicemail. This RN left a message for the caregiver.     Caregiver: Lequita Halt [phone]     Raelyn Mora, RN  03/24/22 2152
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
## 2022-03-24 NOTE — ED Triage Notes (Signed)
Per ems pt was visiting a group home when the staff noticed that she was not responding and appeared to be shaking both upper extremities. Pt has HX of dementia and seizures. Pt did not take seizure medication today.

## 2022-03-25 ENCOUNTER — Inpatient Hospital Stay
Admit: 2022-03-25 | Discharge: 2022-03-25 | Disposition: A | Discharge status: Home / Self Care | Payer: MEDICARE | Attending: Emergency Medicine

## 2022-03-25 DIAGNOSIS — Z760 Encounter for issue of repeat prescription: Secondary | ICD-10-CM

## 2022-03-25 LAB — TROPONIN: Troponin, High Sensitivity: 8 ng/L (ref 0–51)

## 2022-03-25 LAB — POC BLOOD GAS AND CHEMISTRY
Base Excess: 4.8 mmol/L
HCO3, Mixed: 31.1 mmol/L — ABNORMAL HIGH (ref 19.0–28.0)
POC Chloride: 101 MMOL/L (ref 98–107)
POC Creatinine: 0.75 MG/DL (ref 0.6–1.3)
POC Glucose: 75 mg/dL (ref 65–100)
POC Ionized Calcium: 1.18 mmol/L (ref 1.12–1.32)
POC Lactic Acid: 1.96 mmol/L (ref 0.40–2.00)
POC O2 SAT: 59 %
POC Potassium: 7.4 mmol/L (ref 3.5–5.5)
POC Sodium: 137 mmol/L (ref 136–145)
POC TCO2: 31 MMOL/L
eGFR, POC: >60 mL/min/{1.73_m2} (ref >60)
pCO2, Arterial, POC: 51.7 mmHg — ABNORMAL HIGH (ref 35.0–45.0)
pH, Arterial, POC: 7.39 (ref 7.35–7.45)
pO2, Arterial, POC: 32 mmHg — CL (ref 75–100)

## 2022-03-25 LAB — COMPREHENSIVE METABOLIC PANEL
ALT: 19 U/L (ref 12–78)
AST: 25 U/L (ref 15–37)
Albumin/Globulin Ratio: 0.8 — ABNORMAL LOW (ref 1.1–2.2)
Albumin: 3.5 g/dL (ref 3.5–5.0)
Alk Phosphatase: 84 U/L (ref 45–117)
Anion Gap: 6 mmol/L (ref 5–15)
BUN: 14 mg/dL (ref 6–20)
Bun/Cre Ratio: 14 (ref 12–20)
CO2: 30 mmol/L (ref 21–32)
Calcium: 9.5 mg/dL (ref 8.5–10.1)
Chloride: 103 mmol/L (ref 97–108)
Creatinine: 0.97 mg/dL (ref 0.55–1.02)
Est, Glom Filt Rate: >60 mL/min/{1.73_m2} (ref >60)
Globulin: 4.2 g/dL — ABNORMAL HIGH (ref 2.0–4.0)
Glucose: 110 mg/dL — ABNORMAL HIGH (ref 65–100)
Potassium: 5.3 mmol/L — ABNORMAL HIGH (ref 3.5–5.1)
Sodium: 139 mmol/L (ref 136–145)
Total Bilirubin: 0.2 mg/dL (ref 0.2–1.0)
Total Protein: 7.7 g/dL (ref 6.4–8.2)

## 2022-03-25 MED ORDER — LACOSAMIDE 100 MG PO TABS
100 MG | ORAL_TABLET | Freq: Two times a day (BID) | ORAL | 0 refills | Status: DC
Start: 2022-03-25 — End: 2022-06-17

## 2022-03-25 MED ORDER — INSULIN REGULAR HUMAN 100 UNIT/ML IJ SOLN
100 UNIT/ML | Freq: Once | INTRAMUSCULAR | Status: DC
Start: 2022-03-25 — End: 2022-03-24

## 2022-03-25 MED ORDER — DEXTROSE 10% IV BOLUS (PEDS)
Freq: Once | INTRAVENOUS | Status: DC
Start: 2022-03-25 — End: 2022-03-24

## 2022-03-25 NOTE — Other (Signed)
Patient does not appear to be in any acute distress/shows no evidence of clinical instability at this time.     Provider has reviewed discharge instructions with the patient/family.  The patient/family verbalized understanding instructions as well as need for follow up for any further symptoms.     Discharge papers given, education provided, and any questions answered.

## 2022-03-25 NOTE — ED Notes (Signed)
Patient discharged at this time. Patient left via [facility] with discharge packet. Caregiver notified on patients depart.      Nichola Sizer, RN  03/25/22 [phone]

## 2022-03-25 NOTE — ED Triage Notes (Signed)
Pt presents to ED with request for medication refill for Vimpat for seizure disorder. Pt had seizure yesterday and was seen at [facility]. No further seizure activity.
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
## 2022-03-25 NOTE — ED Notes (Signed)
Spoke with Lequita Halt of the adult home and also pt daughter in law Kandie Keiper.  DC instructions were given to family and will be sent with pt. Per Lequita Halt of the facility, there is no staff available to pick pt up and  "she will have to stay in the ER until tomorrow".  Let facility rep know that we could not keep her all night after dc.  She then states "you can put her in a cab and send her home".  Informed Lequita Halt that it was inappropriate to place a pt with dementia in a cab alone and that we would try to arrange ambulance transport.  Family kept informed of all arrangements and agreed with transport via ambulance due to pt diagnosis.  Reference [phone] for transport home.       Evern Bio, RN  03/25/22 [phone]

## 2022-03-25 NOTE — Discharge Instructions (Signed)
Thank you!  Thank you for allowing me to care for you in the emergency department. It is my goal to provide you with excellent care. If you have not received excellent quality care, please ask to speak to the nurse manager. Please fill out the survey that will come to you by mail or email since we listen to your feedback!     Below you will find a list of your tests from today's visit.  Should you have any questions, please do not hesitate to call the emergency department.    Labs  Recent Results (from the past 12 hour(s))   CBC with Auto Differential    Collection Time: 03/24/22  7:31 PM   Result Value Ref Range    WBC 8.5 3.6 - 11.0 K/uL    RBC 4.51 3.80 - 5.20 M/uL    Hemoglobin 13.8 11.5 - 16.0 g/dL    Hematocrit 43.1 35.0 - 47.0 %    MCV 95.6 80.0 - 99.0 FL    MCH 30.6 26.0 - 34.0 PG    MCHC 32.0 30.0 - 36.5 g/dL    RDW 14.9 (H) 11.5 - 14.5 %    Platelets 909 (H) 150 - 400 K/uL    MPV 10.3 8.9 - 12.9 FL    Nucleated RBCs 0.0 0.0 PER 100 WBC    nRBC 0.00 0.00 - 0.01 K/uL    Neutrophils % 64 32 - 75 %    Lymphocytes % 18 12 - 49 %    Monocytes % 13 5 - 13 %    Eosinophils % 4 0 - 7 %    Basophils % 1 0 - 1 %    Immature Granulocytes 0 0 - 0.5 %    Neutrophils Absolute 5.5 1.8 - 8.0 K/UL    Lymphocytes Absolute 1.5 0.8 - 3.5 K/UL    Monocytes Absolute 1.1 (H) 0.0 - 1.0 K/UL    Eosinophils Absolute 0.3 0.0 - 0.4 K/UL    Basophils Absolute 0.1 0.0 - 0.1 K/UL    Absolute Immature Granulocyte 0.0 0.00 - 0.04 K/UL    Differential Type AUTOMATED     Comprehensive Metabolic Panel    Collection Time: 03/24/22  7:31 PM   Result Value Ref Range    Sodium 139 136 - 145 mmol/L    Potassium 5.3 (H) 3.5 - 5.1 mmol/L    Chloride 103 97 - 108 mmol/L    CO2 30 21 - 32 mmol/L    Anion Gap 6 5 - 15 mmol/L    Glucose 110 (H) 65 - 100 mg/dL    BUN 14 6 - 20 mg/dL    Creatinine 0.97 0.55 - 1.02 mg/dL    Bun/Cre Ratio 14 12 - 20      Est, Glom Filt Rate >60 >60 ml/min/1.77m    Calcium 9.5 8.5 - 10.1 mg/dL    Total Bilirubin 0.2  0.2 - 1.0 mg/dL    AST 25 15 - 37 U/L    ALT 19 12 - 78 U/L    Alk Phosphatase 84 45 - 117 U/L    Total Protein 7.7 6.4 - 8.2 g/dL    Albumin 3.5 3.5 - 5.0 g/dL    Globulin 4.2 (H) 2.0 - 4.0 g/dL    Albumin/Globulin Ratio 0.8 (L) 1.1 - 2.2     Troponin    Collection Time: 03/24/22  7:31 PM   Result Value Ref Range    Troponin, High Sensitivity 8 0 - 51  ng/L   POC Blood Gas and Chemistry    Collection Time: 03/24/22 11:19 PM   Result Value Ref Range    pH, Arterial, POC 7.39 7.35 - 7.45      pCO2, Arterial, POC 51.7 (H) 35.0 - 45.0 mmHg    pO2, Arterial, POC 32 (LL) 75 - 100 mmHg    POC Sodium 137 136 - 145 mmol/L    POC Potassium 7.4 (HH) 3.5 - 5.5 mmol/L    POC Ionized Calcium 1.18 1.12 - 1.32 mmol/L    POC Glucose 75 65 - 100 mg/dL    POC Chloride 101 98 - 107 MMOL/L    POC Creatinine 0.75 0.6 - 1.3 MG/DL    eGFR, POC >60 >60 ml/min/1.14m    Base Excess 4.8 mmol/L    HCO3, Mixed 31.1 (H) 19.0 - 28.0 mmol/L    POC TCO2 31 MMOL/L    Source Venous      Performed by: BRANCH JOVANNA     POC Lactic Acid 1.96 0.40 - 2.00 mmol/L    Critical Value Read Back ALHAZMI     POC O2 SAT 59 %       Radiologic Studies  CT HEAD WO CONTRAST   Final Result   1. No evidence of acute infarct or intracranial hemorrhage.   2. 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            ------------------------------------------------------------------------------------------------------------  The exam and treatment you received in the Emergency Department were for an urgent problem and are not intended as complete care. It is important that you follow-up with a doctor, nurse practitioner, or physician assistant to:  (1) confirm your diagnosis,  (2) re-evaluation of changes in your illness and treatment, and  (3) for ongoing care. Please take your discharge instructions with you when you go to your follow-up appointment.     If you have any problem arranging a follow-up appointment,  contact the Emergency Department.  If your symptoms become worse or you do not improve as expected and you are unable to reach your health care provider, please return to the Emergency Department. We are available 24 hours a day.

## 2022-03-25 NOTE — ED Notes (Addendum)
Call at 0859: A person called asking for a prescription of Vimpat be sent to pharmacy because pt ran out. Reports pt's caregiver is out of town and has not had the opportunity to get pt's " medications straight". Instructed this individual to either return pt to here or an ED or a [facility]( reports pt is "way out in [place]) for evaluation and prescription refill since med is a controlled substance. This individual reports they do not know who the PCP or neurologist is for the pt. Person hung up.     Salena Saner, RN  03/25/22 0913       Salena Saner, RN  03/25/22 [phone]

## 2022-03-26 LAB — EKG 12-LEAD
Atrial Rate: 85 {beats}/min
Diagnosis: NORMAL
P Axis: 60 degrees
P-R Interval: 114 ms
Q-T Interval: 358 ms
QRS Duration: 68 ms
QTc Calculation (Bazett): 426 ms
R Axis: 31 degrees
T Axis: 60 degrees
Ventricular Rate: 85 {beats}/min

## 2022-03-27 NOTE — Telephone Encounter (Signed)
Patient wanted to know what her results are to her CPAP report before making the HST appointment.

## 2022-03-27 NOTE — Telephone Encounter (Signed)
Called the patient and I reviewed the compliance data with her. Her compliance data was good and apnea is treated with her current CPAP that is set at 6 cm water pressure. Informed her that overall the sleep study was needed to determine if sleep apnea is still a concern and if so, were changes needed to be made to the pressure on the new machine when ordered. Pt is now ready to schedule and I provided her with the phone number to the sleep center to reschedule.

## 2022-03-28 LAB — EKG 12-LEAD
Atrial Rate: 94 {beats}/min
P Axis: 39 degrees
P-R Interval: 104 ms
Q-T Interval: 334 ms
QRS Duration: 60 ms
QTc Calculation (Bazett): 417 ms
R Axis: 21 degrees
T Axis: 47 degrees
Ventricular Rate: 94 {beats}/min

## 2022-03-28 NOTE — Telephone Encounter (Signed)
HST- [employer] no Berkley Harvey req spoke to [place] ref # [id number].  Patient is scheduled at [facility] for 04/25/22 at 10 AM.  Mailed packet to the patient.

## 2022-04-25 ENCOUNTER — Ambulatory Visit (INDEPENDENT_AMBULATORY_CARE_PROVIDER_SITE_OTHER): Payer: Medicare HMO | Admitting: Neurology

## 2022-04-25 DIAGNOSIS — G4733 Obstructive sleep apnea (adult) (pediatric): Secondary | ICD-10-CM

## 2022-04-26 NOTE — Progress Notes (Signed)
See procedure note.

## 2022-04-27 NOTE — Procedures (Signed)
   [facility]  HOME SLEEP TEST (Watch PAT) REPORT  STUDY DATE: 04/25/2022  DOB: 1945-08-29  MRN: [id number]  ORDERING CLINICIAN: Huston Foley, MD, PhD   REFERRING CLINICIAN: Shawnie Dapper, NP  CLINICAL INFORMATION/HISTORY: 76 year old female with a history of hypertension, gout, reflux disease, and obesity, who presents for reevaluation of her obstructive sleep apnea.  She has been compliant with her CPAP of 6 cm, she qualifies for new equipment.  BMI: 41.1 kg/m  FINDINGS:   Sleep Summary:   Total Recording Time (hours, min): 7 hours, 46 min  Total Sleep Time (hours, min):  6 hours, 37 min  Percent REM (%):    12.6%   Respiratory Indices:   Calculated pAHI (per hour):  15.5/hour         REM pAHI:    27.7/hour       NREM pAHI: 13.8/hour  Central pAHI: 0.3/hour  Oxygen Saturation Statistics:    Oxygen Saturation (%) Mean: 92%   Minimum oxygen saturation (%):                 84%   O2 Saturation Range (%): 84-97%    O2 Saturation (minutes) <=88%: 0.3 min  Pulse Rate Statistics:   Pulse Mean (bpm):    65/min    Pulse Range (48-84/min)   IMPRESSION: OSA (obstructive sleep apnea)   RECOMMENDATION:  This home sleep test demonstrates moderate obstructive sleep apnea with a total AHI of 15.5/hour and O2 nadir of 84%.  Intermittent mild to moderate snoring was detected.  Ongoing treatment with a positive airway pressure (PAP) device is recommended. The patient will be advised to proceed with treatment with new equipment, she can use a CPAP at a set pressure of 6 cm which has worked out well for her.  She can also trial AutoPap therapy with a pressure close to her current pressure of 6 cm, such as a range of 5 to 9 cm.  Alternative treatment options may include a dental device through dentistry or orthodontics in selected patients or Inspire (hypoglossal nerve stimulator) in carefully selected patients (meeting inclusion criteria).  Concomitant weight loss  is recommended (where clinically appropriate). Please note that untreated obstructive sleep apnea may carry additional perioperative morbidity. Patients with significant obstructive sleep apnea should receive perioperative PAP therapy and the surgeons and particularly the anesthesiologist should be informed of the diagnosis and the severity of the sleep disordered breathing. The patient should be cautioned not to drive, work at heights, or operate dangerous or heavy equipment when tired or sleepy. Review and reiteration of good sleep hygiene measures should be pursued with any patient. Other causes of the patient's symptoms, including circadian rhythm disturbances, an underlying mood disorder, medication effect and/or an underlying medical problem cannot be ruled out based on this test. Clinical correlation is recommended. The patient and her referring provider will be notified of the test results. The patient will be seen in follow up in sleep clinic at [facility].  I certify that I have reviewed the raw data recording prior to the issuance of this report in accordance with the standards of the American Academy of Sleep Medicine (AASM).   INTERPRETING PHYSICIAN:   Huston Foley, MD, PhD  Board Certified in Neurology and Sleep Medicine  [facility] [address] [place], [place] [address] [phone]

## 2022-05-02 ENCOUNTER — Other Ambulatory Visit: Payer: Self-pay | Admitting: Family Medicine

## 2022-05-02 DIAGNOSIS — G4733 Obstructive sleep apnea (adult) (pediatric): Secondary | ICD-10-CM

## 2022-05-03 DIAGNOSIS — I129 Hypertensive chronic kidney disease with stage 1 through stage 4 chronic kidney disease, or unspecified chronic kidney disease: Secondary | ICD-10-CM | POA: Diagnosis not present

## 2022-05-03 DIAGNOSIS — G4733 Obstructive sleep apnea (adult) (pediatric): Secondary | ICD-10-CM | POA: Diagnosis not present

## 2022-05-03 DIAGNOSIS — E785 Hyperlipidemia, unspecified: Secondary | ICD-10-CM | POA: Diagnosis not present

## 2022-05-03 DIAGNOSIS — N1831 Chronic kidney disease, stage 3a: Secondary | ICD-10-CM | POA: Diagnosis not present

## 2022-05-03 DIAGNOSIS — K219 Gastro-esophageal reflux disease without esophagitis: Secondary | ICD-10-CM | POA: Diagnosis not present

## 2022-05-03 DIAGNOSIS — J309 Allergic rhinitis, unspecified: Secondary | ICD-10-CM | POA: Diagnosis not present

## 2022-05-03 DIAGNOSIS — E1122 Type 2 diabetes mellitus with diabetic chronic kidney disease: Secondary | ICD-10-CM | POA: Diagnosis not present

## 2022-05-03 DIAGNOSIS — M109 Gout, unspecified: Secondary | ICD-10-CM | POA: Diagnosis not present

## 2022-05-03 NOTE — Progress Notes (Signed)
Faxed CPAP order to [place] at [phone]. Received fax confirmation.

## 2022-05-09 ENCOUNTER — Encounter: Payer: Self-pay | Admitting: Neurology

## 2022-05-23 ENCOUNTER — Inpatient Hospital Stay: Payer: MEDICARE | Primary: Internal Medicine

## 2022-05-23 ENCOUNTER — Emergency Department: Admit: 2022-05-23 | Payer: MEDICARE | Primary: Internal Medicine

## 2022-05-23 ENCOUNTER — Inpatient Hospital Stay
Admission: EM | Admit: 2022-05-23 | Discharge: 2022-06-16 | Disposition: A | Discharge status: Skilled Nursing Facility | Payer: Medicare Other | Source: Skilled Nursing Facility | Admitting: Internal Medicine

## 2022-05-23 ENCOUNTER — Inpatient Hospital Stay: Admit: 2022-05-23 | Payer: MEDICARE | Primary: Internal Medicine

## 2022-05-23 DIAGNOSIS — G9341 Metabolic encephalopathy: Principal | ICD-10-CM

## 2022-05-23 DIAGNOSIS — G9340 Encephalopathy, unspecified: Secondary | ICD-10-CM

## 2022-05-23 LAB — POCT BLOOD GAS & ELECTROLYTES
Anion Gap, POC: 11 (ref 10–20)
Anion Gap, POC: 7 — ABNORMAL LOW (ref 10–20)
Base Excess: 3.1 mmol/L
Base Excess: 4.6 mmol/L
HCO3, Arterial: 28 mmol/L
HCO3, Arterial: 29 mmol/L
PCO2, Venus, POC: 41.3 MMHG (ref 41–51)
PCO2, Venus, POC: 42.7 MMHG (ref 41–51)
PH, VENOUS (POC): 7.44 — ABNORMAL HIGH (ref 7.32–7.42)
PH, VENOUS (POC): 7.45 — ABNORMAL HIGH (ref 7.32–7.42)
PO2, VENOUS (POC): <27 mmHg (ref 25–40)
PO2, VENOUS (POC): 40 mmHg (ref 25–40)
POC Chloride: 104 MMOL/L (ref 100–108)
POC Chloride: 105 MMOL/L (ref 100–108)
POC Creatinine: 0.9 MG/DL (ref 0.6–1.3)
POC Creatinine: 1.1 MG/DL (ref 0.6–1.3)
POC Glucose: 75 MG/DL (ref 74–106)
POC Glucose: 68 MG/DL — ABNORMAL LOW (ref 74–106)
POC Ionized Calcium: 1.11 mmol/L — ABNORMAL LOW (ref 1.12–1.32)
POC Ionized Calcium: 1.13 mmol/L (ref 1.12–1.32)
POC Lactic Acid: 2.83 mmol/L (ref 0.40–2.00)
POC Lactic Acid: 2.59 mmol/L (ref 0.40–2.00)
POC O2 SAT: 77 %
POC Potassium: 4.4 MMOL/L (ref 3.5–5.5)
POC Potassium: 5.8 MMOL/L — ABNORMAL HIGH (ref 3.5–5.5)
POC Sodium: 143 MMOL/L (ref 136–145)
POC Sodium: 141 MMOL/L (ref 136–145)
POC TCO2: 28 MMOL/L — ABNORMAL HIGH (ref 19–24)
POC TCO2: 29 MMOL/L — ABNORMAL HIGH (ref 19–24)
eGFR, POC: >60 mL/min/{1.73_m2} (ref >60)
eGFR, POC: 53 mL/min/{1.73_m2} — ABNORMAL LOW (ref >60)

## 2022-05-23 LAB — COMPREHENSIVE METABOLIC PANEL
ALT: 24 U/L (ref 12–78)
AST: 27 U/L (ref 15–37)
Albumin/Globulin Ratio: 0.7 — ABNORMAL LOW (ref 1.1–2.2)
Albumin: 3.2 g/dL — ABNORMAL LOW (ref 3.5–5.0)
Alk Phosphatase: 74 U/L (ref 45–117)
Anion Gap: 4 mmol/L — ABNORMAL LOW (ref 5–15)
BUN: 17 MG/DL (ref 6–20)
Bun/Cre Ratio: 13 (ref 12–20)
CO2: 32 mmol/L (ref 21–32)
Calcium: 10 MG/DL (ref 8.5–10.1)
Chloride: 104 mmol/L (ref 97–108)
Creatinine: 1.36 MG/DL — ABNORMAL HIGH (ref 0.55–1.02)
Est, Glom Filt Rate: 41 mL/min/{1.73_m2} — ABNORMAL LOW (ref >60)
Globulin: 4.3 g/dL — ABNORMAL HIGH (ref 2.0–4.0)
Glucose: 132 mg/dL — ABNORMAL HIGH (ref 65–100)
Potassium: 4.6 mmol/L (ref 3.5–5.1)
Sodium: 140 mmol/L (ref 136–145)
Total Bilirubin: 0.6 MG/DL (ref 0.2–1.0)
Total Protein: 7.5 g/dL (ref 6.4–8.2)

## 2022-05-23 LAB — POCT GLUCOSE
POC Glucose: 124 mg/dL — ABNORMAL HIGH (ref 65–117)
POC Glucose: 103 mg/dL (ref 65–117)
POC Glucose: 115 mg/dL (ref 65–117)
POC Glucose: 81 mg/dL (ref 65–117)

## 2022-05-23 LAB — CBC WITH AUTO DIFFERENTIAL
Absolute Immature Granulocyte: 0 10*3/uL (ref 0.00–0.04)
Basophils %: 1 % (ref 0–1)
Basophils Absolute: 0.1 10*3/uL (ref 0.0–0.1)
Eosinophils %: 1 % (ref 0–7)
Eosinophils Absolute: 0.1 10*3/uL (ref 0.0–0.4)
Hematocrit: 46.3 % (ref 35.0–47.0)
Hemoglobin: 14.9 g/dL (ref 11.5–16.0)
Immature Granulocytes: 0 % (ref 0.0–0.5)
Lymphocytes %: 17 % (ref 12–49)
Lymphocytes Absolute: 1.7 10*3/uL (ref 0.8–3.5)
MCH: 30 PG (ref 26.0–34.0)
MCHC: 32.2 g/dL (ref 30.0–36.5)
MCV: 93.3 FL (ref 80.0–99.0)
MPV: 10.6 FL (ref 8.9–12.9)
Monocytes %: 9 % (ref 5–13)
Monocytes Absolute: 0.9 10*3/uL (ref 0.0–1.0)
Neutrophils %: 72 % (ref 32–75)
Neutrophils Absolute: 7.3 10*3/uL (ref 1.8–8.0)
Nucleated RBCs: 0 PER 100 WBC
Platelets: 904 10*3/uL — ABNORMAL HIGH (ref 150–400)
RBC: 4.96 M/uL (ref 3.80–5.20)
RDW: 15 % — ABNORMAL HIGH (ref 11.5–14.5)
WBC: 10.1 10*3/uL (ref 3.6–11.0)
nRBC: 0 10*3/uL (ref 0.00–0.01)

## 2022-05-23 LAB — URINALYSIS WITH REFLEX TO CULTURE
BACTERIA, URINE: NEGATIVE /hpf
Bilirubin Urine: NEGATIVE
Blood, Urine: NEGATIVE
Glucose, UA: NEGATIVE mg/dL
Ketones, Urine: NEGATIVE mg/dL
Leukocyte Esterase, Urine: NEGATIVE
Nitrite, Urine: NEGATIVE
Protein, UA: NEGATIVE mg/dL
Specific Gravity, UA: 1.014 (ref 1.003–1.030)
Urobilinogen, Urine: 0.2 EU/dL (ref 0.2–1.0)
pH, Urine: 7.5 (ref 5.0–8.0)

## 2022-05-23 LAB — TROPONIN: Troponin, High Sensitivity: 8 ng/L (ref 0–51)

## 2022-05-23 LAB — VALPROIC ACID LEVEL, TOTAL: Valproic Acid: 67 ug/ml (ref 50–100)

## 2022-05-23 LAB — EXTRA TUBES HOLD

## 2022-05-23 LAB — D-DIMER, QUANTITATIVE: D-Dimer, Quant: 2.14 mg/L FEU — ABNORMAL HIGH (ref 0.00–0.65)

## 2022-05-23 LAB — CK: Total CK: 342 U/L — ABNORMAL HIGH (ref 26–192)

## 2022-05-23 LAB — AMMONIA: Ammonia: 25 umol/L (ref <32)

## 2022-05-23 LAB — TSH: TSH, 3RD GENERATION: 0.94 u[IU]/mL (ref 0.36–3.74)

## 2022-05-23 MED ORDER — SODIUM CHLORIDE 0.9 % IV SOLN
0.9 % | INTRAVENOUS | Status: DC
Start: 2022-05-23 — End: 2022-05-23

## 2022-05-23 MED ORDER — POLYETHYLENE GLYCOL 3350 17 G PO PACK
17 g | Freq: Every day | ORAL | Status: DC | PRN
Start: 2022-05-23 — End: 2022-06-16
  Administered 2022-06-10 (×2): 17 g via ORAL

## 2022-05-23 MED ORDER — NORMAL SALINE FLUSH 0.9 % IV SOLN
0.9 % | INTRAVENOUS | Status: AC | PRN
Start: 2022-05-23 — End: 2022-05-29
  Administered 2022-05-29 (×2): 10 mL via INTRAVENOUS

## 2022-05-23 MED ORDER — NORMAL SALINE FLUSH 0.9 % IV SOLN
0.9 % | Freq: Two times a day (BID) | INTRAVENOUS | Status: DC
Start: 2022-05-23 — End: 2022-06-16
  Administered 2022-05-23: 5 mL via INTRAVENOUS
  Administered 2022-05-24 – 2022-06-03 (×16): 10 mL via INTRAVENOUS
  Administered 2022-06-03: 5 mL via INTRAVENOUS
  Administered 2022-06-04: 13:00:00 10 mL via INTRAVENOUS
  Administered 2022-06-04: 01:00:00 5 mL via INTRAVENOUS
  Administered 2022-06-05: 13:00:00 10 mL via INTRAVENOUS
  Administered 2022-06-05: 01:00:00 5 mL via INTRAVENOUS
  Administered 2022-06-06 – 2022-06-14 (×13): 10 mL via INTRAVENOUS

## 2022-05-23 MED ORDER — SODIUM CHLORIDE 0.9 % IV SOLN
0.9 % | INTRAVENOUS | Status: DC | PRN
Start: 2022-05-23 — End: 2022-06-16

## 2022-05-23 MED ORDER — GLUCOSE 4 G PO CHEW
4 g | ORAL | Status: DC | PRN
Start: 2022-05-23 — End: 2022-05-24

## 2022-05-23 MED ORDER — GLUCAGON (RDNA) 1 MG IJ KIT
1 MG | INTRAMUSCULAR | Status: AC | PRN
Start: 2022-05-23 — End: 2022-06-16

## 2022-05-23 MED ORDER — DEXTROSE 10 % IV BOLUS
INTRAVENOUS | Status: DC | PRN
Start: 2022-05-23 — End: 2022-06-16

## 2022-05-23 MED ORDER — ACETAMINOPHEN 325 MG PO TABS
325 MG | Freq: Four times a day (QID) | ORAL | Status: DC | PRN
Start: 2022-05-23 — End: 2022-06-16
  Administered 2022-05-29 – 2022-06-11 (×6): 650 mg via ORAL

## 2022-05-23 MED ORDER — DEXTROSE-NACL 5-0.9 % IV SOLN
INTRAVENOUS | Status: DC
Start: 2022-05-23 — End: 2022-05-23
  Administered 2022-05-23: 18:00:00 via INTRAVENOUS

## 2022-05-23 MED ORDER — SODIUM CHLORIDE 0.9 % IV BOLUS
0.9 % | Freq: Once | INTRAVENOUS | Status: AC
Start: 2022-05-23 — End: 2022-05-23
  Administered 2022-05-23: 16:00:00 1000 mL via INTRAVENOUS

## 2022-05-23 MED ORDER — ACETAMINOPHEN 650 MG RE SUPP
650 MG | Freq: Four times a day (QID) | RECTAL | Status: DC | PRN
Start: 2022-05-23 — End: 2022-06-16

## 2022-05-23 MED ORDER — DEXTROSE 10 % IV BOLUS
INTRAVENOUS | Status: DC | PRN
Start: 2022-05-23 — End: 2022-06-16
  Administered 2022-05-23 – 2022-05-24 (×2): 125 mL via INTRAVENOUS

## 2022-05-23 MED ORDER — ONDANSETRON HCL 4 MG/2ML IJ SOLN
4 MG/2ML | Freq: Four times a day (QID) | INTRAMUSCULAR | Status: AC | PRN
Start: 2022-05-23 — End: 2022-05-29

## 2022-05-23 MED ORDER — DEXTROSE 5 % IV SOLN
5 % | INTRAVENOUS | Status: DC
Start: 2022-05-23 — End: 2022-05-23
  Administered 2022-05-23: 16:00:00 via INTRAVENOUS

## 2022-05-23 MED ORDER — DEXTROSE-NACL 10-0.45 % IV SOLN
INTRAVENOUS | Status: AC
Start: 2022-05-23 — End: 2022-05-24
  Administered 2022-05-23 – 2022-05-25 (×3): via INTRAVENOUS

## 2022-05-23 MED ORDER — ONDANSETRON 4 MG PO TBDP
4 MG | Freq: Three times a day (TID) | ORAL | Status: AC | PRN
Start: 2022-05-23 — End: 2022-05-29

## 2022-05-23 MED ORDER — IOPAMIDOL 76 % IV SOLN
76 % | Freq: Once | INTRAVENOUS | Status: AC | PRN
Start: 2022-05-23 — End: 2022-05-23
  Administered 2022-05-23: 19:00:00 100 mL via INTRAVENOUS

## 2022-05-23 MED ORDER — DEXTROSE 10 % IV SOLN
10 % | INTRAVENOUS | Status: DC | PRN
Start: 2022-05-23 — End: 2022-06-16

## 2022-05-23 MED FILL — DEXTROSE 10 % IV SOLN: 10 % | INTRAVENOUS | Qty: 500

## 2022-05-23 MED FILL — DEXTROSE-NACL 10-0.45 % IV SOLN: INTRAVENOUS | Qty: 1000

## 2022-05-23 MED FILL — DEXTROSE 5 % IV SOLN: 5 % | INTRAVENOUS | Qty: 1000

## 2022-05-23 MED FILL — DEXTROSE-NACL 5-0.9 % IV SOLN: INTRAVENOUS | Qty: 1000

## 2022-05-23 MED FILL — ISOVUE-370 76 % IV SOLN: 76 % | INTRAVENOUS | Qty: 100

## 2022-05-23 MED FILL — BD POSIFLUSH 0.9 % IV SOLN: 0.9 % | INTRAVENOUS | Qty: 40

## 2022-05-23 MED FILL — SODIUM CHLORIDE 0.9 % IV SOLN: 0.9 % | INTRAVENOUS | Qty: 1000

## 2022-05-23 NOTE — ED Notes (Signed)
Med rec completed using list provided by pt's caregiver; also was reported that pt took all morning meds today PTA.      Bari Mantis, RN  05/23/22 1751

## 2022-05-23 NOTE — Progress Notes (Signed)
Ultrasound IV by Luane School, RN :  Procedure Note    Ultrasound IV education provided to patient. Opportunities for questions given.     Ultrasound used for PIV placement:  20gauge 1.88 cm   L forearm location.  1 X Attempt(s).    Flushed with ease; vigorous blood return.     Procedure tolerated well. Primary RN aware of IV placement and added to LDA.      Luane School, RN
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
## 2022-05-23 NOTE — Consults (Signed)
Please see H&P.
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
## 2022-05-23 NOTE — ED Triage Notes (Signed)
9:35 AM  I have evaluated the patient as the Provider in Rapid Medical Evaluation (RME). I have reviewed her vital signs and the triage nurse assessment. I have talked with the patient and any available family and advised that I am the provider in triage and have ordered the appropriate study to initiate their work up based on the clinical presentation during my assessment. I have advised that the patient will be accommodated in the Main ED as soon as possible. I have also requested to contact the triage nurse or myself immediately if the patient experiences any changes in their condition during this brief waiting period.  Michaele Offer, PA-C      Per EMS: fall at 2:30 am in group home, with nose bleed. Sent in this morning with concern for concussion. AO x4 at baseline ?. Sugar 59 with EMS, received 15 g glucose    Patient denies any pain. Oriented to person, place and her birthday but cannot give month or year. Pupils pinpoint, patient appears slightly lethargic.

## 2022-05-23 NOTE — ED Notes (Signed)
Pharmacy messaged at this time regarding the hard guardrail limit of 267mL/hr for D10 infusion.      Bari Mantis, RN  05/23/22 1605

## 2022-05-23 NOTE — ED Notes (Signed)
Pt back from MRI at this time. Well-appearing, NAD.      Bari Mantis, RN  05/23/22 1928

## 2022-05-23 NOTE — ED Notes (Signed)
Pt disconnected from fluids; transport taking pt to MRI at this time. Pt sleepy but arousable.      Bari Mantis, RN  05/23/22 [phone]

## 2022-05-23 NOTE — ED Notes (Signed)
Straight cath obtained, urine obtained, without inceident. Virgel Bouquet, caregiver at bedside at time of straight cath. Pt tolerated well. Urine placed in specimen cup included in straight cath kit.      Bari Mantis, RN  05/23/22 [phone]

## 2022-05-23 NOTE — ED Notes (Signed)
RN called vascular access team to request second line placement due to multiple attempts at starting second point of IV access     Meghan [name], [place]  05/23/22 1156

## 2022-05-23 NOTE — Progress Notes (Signed)
Chart reviewed in preparation for physical therapy evaluation. Spoke with RN. Patient this afternoon lethargic, not able to maintain arousal for PT interventions. Will hold and follow up tomorrow.    Thank you,  Pervis Hocking, PT, DPT

## 2022-05-23 NOTE — ED Notes (Signed)
Pt currently alert and oriented x4, NPO order in place at this time. Caregiver Margiesha at bedside.      Bari Mantis, RN  05/23/22 1530

## 2022-05-23 NOTE — ED Notes (Signed)
Spoke to [place] in the lab- lab aware of add on tests for this patient     Candis Schatz, RN  05/23/22 1036

## 2022-05-23 NOTE — ED Notes (Signed)
MRI screening form complete with Virgel Bouquet, pt's caregiver.      Bari Mantis, RN  05/23/22 [phone]

## 2022-05-23 NOTE — ED Notes (Signed)
D10 164mL going at 221mL/hr at this time; pump had hard stop and wouldn't allow for the ordered rate.     Bari Mantis, RN  05/23/22 1529

## 2022-05-23 NOTE — Progress Notes (Signed)
OT order received, chart reviewed, consulted with team- patient quite lethargic and unable to meaningfully participate in OT eval at this time. Evaluation pending  Enid Cutter, OTR/L

## 2022-05-23 NOTE — ED Triage Notes (Signed)
Pt arrives via EMS from a group home for complaints of an unwitnessed fall and nose bleed at 0230 this morning.     EMS was called for a concussion rule out. EMS states that BG was 59 on scene and gave patient glucose.     PMH of parkinsons, seizures and HTN.

## 2022-05-23 NOTE — ED Notes (Signed)
D10 bolus completed. Will reassess FSBS at 15 minutes post infusion as ordered.      Bari Mantis, RN  05/23/22 1606
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
## 2022-05-23 NOTE — ED Notes (Signed)
RN [employer] messaged provider that pt's POC glucose level was 68. Provider reports that she is going to place order for D50amp. RN awaiting order     Doreatha Massed, RN  05/23/22 1504

## 2022-05-23 NOTE — ED Notes (Signed)
VSS upon arrival from MRI; IVF resumed through [facility]; pt arousable; FSBS 81.      Bari Mantis, RN  05/23/22 1929

## 2022-05-24 ENCOUNTER — Inpatient Hospital Stay: Admit: 2022-05-24 | Payer: MEDICARE | Primary: Internal Medicine

## 2022-05-24 LAB — CBC WITH AUTO DIFFERENTIAL
Absolute Immature Granulocyte: 0 10*3/uL (ref 0.00–0.04)
Basophils %: 1 % (ref 0–1)
Basophils Absolute: 0.1 10*3/uL (ref 0.0–0.1)
Eosinophils %: 3 % (ref 0–7)
Eosinophils Absolute: 0.3 10*3/uL (ref 0.0–0.4)
Hematocrit: 47.2 % — ABNORMAL HIGH (ref 35.0–47.0)
Hemoglobin: 15 g/dL (ref 11.5–16.0)
Immature Granulocytes: 0 % (ref 0.0–0.5)
Lymphocytes %: 30 % (ref 12–49)
Lymphocytes Absolute: 2.8 10*3/uL (ref 0.8–3.5)
MCH: 29.9 PG (ref 26.0–34.0)
MCHC: 31.8 g/dL (ref 30.0–36.5)
MCV: 94 FL (ref 80.0–99.0)
MPV: 10.3 FL (ref 8.9–12.9)
Monocytes %: 13 % (ref 5–13)
Monocytes Absolute: 1.2 10*3/uL — ABNORMAL HIGH (ref 0.0–1.0)
Neutrophils %: 53 % (ref 32–75)
Neutrophils Absolute: 4.9 10*3/uL (ref 1.8–8.0)
Nucleated RBCs: 0 PER 100 WBC
Platelets: 948 10*3/uL — ABNORMAL HIGH (ref 150–400)
RBC: 5.02 M/uL (ref 3.80–5.20)
RDW: 15.2 % — ABNORMAL HIGH (ref 11.5–14.5)
WBC: 9.3 10*3/uL (ref 3.6–11.0)
nRBC: 0 10*3/uL (ref 0.00–0.01)

## 2022-05-24 LAB — LACTIC ACID: Lactic Acid, Plasma: 4.1 MMOL/L (ref 0.4–2.0)

## 2022-05-24 LAB — POCT BLOOD GAS & ELECTROLYTES
Anion Gap, POC: 7 — ABNORMAL LOW (ref 10–20)
Base Excess: 2.8 mmol/L
HCO3, Arterial: 27 mmol/L
PCO2, Venus, POC: 38.6 MMHG — ABNORMAL LOW (ref 41–51)
PH, VENOUS (POC): 7.45 — ABNORMAL HIGH (ref 7.32–7.42)
PO2, VENOUS (POC): 40 mmHg (ref 25–40)
POC Chloride: 113 MMOL/L — ABNORMAL HIGH (ref 100–108)
POC Creatinine: 1.4 MG/DL — ABNORMAL HIGH (ref 0.6–1.3)
POC Glucose: 151 MG/DL — ABNORMAL HIGH (ref 74–106)
POC Ionized Calcium: 1.19 mmol/L (ref 1.12–1.32)
POC Lactic Acid: 3.73 mmol/L (ref 0.40–2.00)
POC O2 SAT: 78 %
POC Potassium: 5.9 MMOL/L — ABNORMAL HIGH (ref 3.5–5.5)
POC Sodium: 147 MMOL/L — ABNORMAL HIGH (ref 136–145)
POC TCO2: 27 MMOL/L — ABNORMAL HIGH (ref 19–24)
eGFR, POC: 39 mL/min/{1.73_m2} — ABNORMAL LOW (ref >60)

## 2022-05-24 LAB — POCT GLUCOSE
POC Glucose: 114 mg/dL (ref 65–117)
POC Glucose: 117 mg/dL (ref 65–117)
POC Glucose: 123 mg/dL — ABNORMAL HIGH (ref 65–117)
POC Glucose: 144 mg/dL — ABNORMAL HIGH (ref 65–117)
POC Glucose: 180 mg/dL — ABNORMAL HIGH (ref 65–117)
POC Glucose: 213 mg/dL — ABNORMAL HIGH (ref 65–117)
POC Glucose: 79 mg/dL (ref 65–117)
POC Glucose: 91 mg/dL (ref 65–117)
POC Glucose: 93 mg/dL (ref 65–117)
POC Glucose: 96 mg/dL (ref 65–117)

## 2022-05-24 LAB — BASIC METABOLIC PANEL W/ REFLEX TO MG FOR LOW K
Anion Gap: 4 mmol/L — ABNORMAL LOW (ref 5–15)
BUN: 12 MG/DL (ref 6–20)
Bun/Cre Ratio: 9 — ABNORMAL LOW (ref 12–20)
CO2: 30 mmol/L (ref 21–32)
Calcium: 9.4 MG/DL (ref 8.5–10.1)
Chloride: 108 mmol/L (ref 97–108)
Creatinine: 1.27 MG/DL — ABNORMAL HIGH (ref 0.55–1.02)
Est, Glom Filt Rate: 44 mL/min/{1.73_m2} — ABNORMAL LOW (ref >60)
Glucose: 60 mg/dL — ABNORMAL LOW (ref 65–100)
Potassium: 4.4 mmol/L (ref 3.5–5.1)
Sodium: 142 mmol/L (ref 136–145)

## 2022-05-24 LAB — BASIC METABOLIC PANEL
Anion Gap: 4 mmol/L — ABNORMAL LOW (ref 5–15)
Anion Gap: 3 mmol/L — ABNORMAL LOW (ref 5–15)
BUN: 11 MG/DL (ref 6–20)
BUN: 12 MG/DL (ref 6–20)
Bun/Cre Ratio: 7 — ABNORMAL LOW (ref 12–20)
Bun/Cre Ratio: 8 — ABNORMAL LOW (ref 12–20)
CO2: 25 mmol/L (ref 21–32)
CO2: 29 mmol/L (ref 21–32)
Calcium: 8.9 MG/DL (ref 8.5–10.1)
Calcium: 8.9 MG/DL (ref 8.5–10.1)
Chloride: 113 mmol/L — ABNORMAL HIGH (ref 97–108)
Chloride: 113 mmol/L — ABNORMAL HIGH (ref 97–108)
Creatinine: 1.48 MG/DL — ABNORMAL HIGH (ref 0.55–1.02)
Creatinine: 1.55 MG/DL — ABNORMAL HIGH (ref 0.55–1.02)
Est, Glom Filt Rate: 37 mL/min/{1.73_m2} — ABNORMAL LOW (ref >60)
Est, Glom Filt Rate: 35 mL/min/{1.73_m2} — ABNORMAL LOW (ref >60)
Glucose: 126 mg/dL — ABNORMAL HIGH (ref 65–100)
Glucose: 51 mg/dL — CL (ref 65–100)
Potassium: 5.4 mmol/L — ABNORMAL HIGH (ref 3.5–5.1)
Potassium: 4.7 mmol/L (ref 3.5–5.1)
Sodium: 142 mmol/L (ref 136–145)
Sodium: 145 mmol/L (ref 136–145)

## 2022-05-24 LAB — HEMOGLOBIN A1C
Hemoglobin A1C: 5.4 % (ref 4.0–5.6)
eAG: 108 mg/dL

## 2022-05-24 LAB — PROCALCITONIN: Procalcitonin: <0.05 ng/mL

## 2022-05-24 LAB — EKG 12-LEAD
Atrial Rate: 75 {beats}/min
Diagnosis: NORMAL
P Axis: 52 degrees
P-R Interval: 138 ms
Q-T Interval: 348 ms
QRS Duration: 70 ms
QTc Calculation (Bazett): 388 ms
R Axis: 40 degrees
T Axis: 61 degrees
Ventricular Rate: 75 {beats}/min

## 2022-05-24 LAB — CBC
Hematocrit: 39.5 % (ref 35.0–47.0)
Hemoglobin: 12.5 g/dL (ref 11.5–16.0)
MCH: 30.2 PG (ref 26.0–34.0)
MCHC: 31.6 g/dL (ref 30.0–36.5)
MCV: 95.4 FL (ref 80.0–99.0)
MPV: 10.7 FL (ref 8.9–12.9)
Nucleated RBCs: 0 PER 100 WBC
Platelets: 867 10*3/uL — ABNORMAL HIGH (ref 150–400)
RBC: 4.14 M/uL (ref 3.80–5.20)
RDW: 15.5 % — ABNORMAL HIGH (ref 11.5–14.5)
WBC: 7.9 10*3/uL (ref 3.6–11.0)
nRBC: 0 10*3/uL (ref 0.00–0.01)

## 2022-05-24 MED ORDER — NALOXONE HCL 0.4 MG/ML IJ SOLN
0.4 MG/ML | INTRAMUSCULAR | Status: DC | PRN
Start: 2022-05-24 — End: 2022-06-16
  Administered 2022-05-24: 20:00:00 0.4 mg via INTRAVENOUS

## 2022-05-24 MED ORDER — SODIUM CHLORIDE 0.9 % IV SOLN
0.9 % | Freq: Three times a day (TID) | INTRAVENOUS | Status: DC
Start: 2022-05-24 — End: 2022-05-24

## 2022-05-24 MED ORDER — DEXTROSE 10 % IV BOLUS
INTRAVENOUS | Status: DC | PRN
Start: 2022-05-24 — End: 2022-06-16

## 2022-05-24 MED ORDER — GLUCOSE 4 G PO CHEW
4 g | ORAL | Status: DC | PRN
Start: 2022-05-24 — End: 2022-06-16

## 2022-05-24 MED ORDER — DEXTROSE 10 % IV SOLN
10 % | INTRAVENOUS | Status: DC | PRN
Start: 2022-05-24 — End: 2022-06-16

## 2022-05-24 MED ORDER — INSULIN LISPRO 100 UNIT/ML IJ SOLN
100 UNIT/ML | Freq: Every evening | INTRAMUSCULAR | Status: DC
Start: 2022-05-24 — End: 2022-05-24

## 2022-05-24 MED ORDER — MEMANTINE HCL 10 MG PO TABS
10 MG | Freq: Two times a day (BID) | ORAL | Status: DC
Start: 2022-05-24 — End: 2022-06-16
  Administered 2022-05-24 – 2022-06-16 (×47): 10 mg via ORAL

## 2022-05-24 MED ORDER — IOPAMIDOL 76 % IV SOLN
76 % | Freq: Once | INTRAVENOUS | Status: AC | PRN
Start: 2022-05-24 — End: 2022-05-24
  Administered 2022-05-24: 14:00:00 80 mL via INTRAVENOUS

## 2022-05-24 MED ORDER — HYDROCORTISONE SOD SUC (PF) 100 MG IJ SOLR
100 MG | Freq: Three times a day (TID) | INTRAMUSCULAR | Status: DC
Start: 2022-05-24 — End: 2022-05-27
  Administered 2022-05-24 – 2022-05-27 (×9): 50 mg via INTRAVENOUS

## 2022-05-24 MED ORDER — VALPROATE SODIUM 100 MG/ML IV SOLN
100 MG/ML | Freq: Every day | INTRAVENOUS | Status: DC
Start: 2022-05-24 — End: 2022-05-24
  Administered 2022-05-24: 13:00:00 500 mg via INTRAVENOUS

## 2022-05-24 MED ORDER — SODIUM CHLORIDE 0.9 % IV BOLUS
0.9 % | Freq: Once | INTRAVENOUS | Status: AC
Start: 2022-05-24 — End: 2022-05-24
  Administered 2022-05-24: 22:00:00 1000 mL via INTRAVENOUS

## 2022-05-24 MED ORDER — VANCOMYCIN 2000 MG IN NS 500 ML (PREMIX) IVPB
Freq: Once | Status: AC
Start: 2022-05-24 — End: 2022-05-24
  Administered 2022-05-25: 01:00:00 2000 mg via INTRAVENOUS

## 2022-05-24 MED ORDER — ROPINIROLE HCL 0.25 MG PO TABS
0.25 MG | Freq: Every evening | ORAL | Status: DC
Start: 2022-05-24 — End: 2022-06-16
  Administered 2022-05-24 – 2022-06-16 (×24): 1 mg via ORAL

## 2022-05-24 MED ORDER — VANCOMYCIN HCL 1 G IV SOLR
1 g | INTRAVENOUS | Status: DC
Start: 2022-05-24 — End: 2022-05-25

## 2022-05-24 MED ORDER — ASPIRIN 81 MG PO CHEW
81 MG | Freq: Every day | ORAL | Status: AC
Start: 2022-05-24 — End: 2022-06-16
  Administered 2022-06-05 – 2022-06-14 (×10): 81 mg via ORAL

## 2022-05-24 MED ORDER — SODIUM CHLORIDE 0.9 % IV BOLUS
0.9 % | Freq: Once | INTRAVENOUS | Status: AC
Start: 2022-05-24 — End: 2022-05-24
  Administered 2022-05-24: 23:00:00 500 mL via INTRAVENOUS

## 2022-05-24 MED ORDER — CEFEPIME HCL 2 G IV SOLR
2 g | Freq: Three times a day (TID) | INTRAVENOUS | Status: DC
Start: 2022-05-24 — End: 2022-05-24

## 2022-05-24 MED ORDER — DONEPEZIL HCL 5 MG PO TABS
5 MG | Freq: Every evening | ORAL | Status: DC
Start: 2022-05-24 — End: 2022-06-16
  Administered 2022-05-25 – 2022-06-16 (×23): 10 mg via ORAL

## 2022-05-24 MED ORDER — LACOSAMIDE 200 MG/20ML IV SOLN
200 MG/20ML | Freq: Two times a day (BID) | INTRAVENOUS | Status: AC
Start: 2022-05-24 — End: 2022-05-29
  Administered 2022-05-24 – 2022-05-29 (×11): 100 mg via INTRAVENOUS

## 2022-05-24 MED ORDER — SODIUM CHLORIDE 0.9 % IV SOLN
0.9 % | Freq: Every evening | INTRAVENOUS | Status: DC
Start: 2022-05-24 — End: 2022-05-24
  Administered 2022-05-24: 04:00:00 375 mg via INTRAVENOUS

## 2022-05-24 MED ORDER — ALBUMIN HUMAN 25 % IV SOLN
25 % | Freq: Once | INTRAVENOUS | Status: AC
Start: 2022-05-24 — End: 2022-05-24
  Administered 2022-05-25: 01:00:00 25 g via INTRAVENOUS

## 2022-05-24 MED ORDER — INSULIN LISPRO 100 UNIT/ML IJ SOLN
100 UNIT/ML | Freq: Three times a day (TID) | INTRAMUSCULAR | Status: DC
Start: 2022-05-24 — End: 2022-05-24
  Administered 2022-05-24: 18:00:00 2 [IU] via SUBCUTANEOUS

## 2022-05-24 MED ORDER — NOREPINEPHRINE-SODIUM CHLORIDE 16-0.9 MG/250ML-% IV SOLN
INTRAVENOUS | Status: DC
Start: 2022-05-24 — End: 2022-05-27
  Administered 2022-05-25: 05:00:00 5 ug/min via INTRAVENOUS

## 2022-05-24 MED ORDER — CITALOPRAM HYDROBROMIDE 20 MG PO TABS
20 MG | Freq: Every day | ORAL | Status: DC
Start: 2022-05-24 — End: 2022-06-16
  Administered 2022-05-24 – 2022-06-16 (×24): 10 mg via ORAL

## 2022-05-24 MED ORDER — STERILE WATER FOR INJECTION (MIXTURES ONLY)
2 g | Freq: Once | INTRAVENOUS | Status: DC
Start: 2022-05-24 — End: 2022-05-25

## 2022-05-24 MED ORDER — SODIUM CHLORIDE 0.9 % IV SOLN (MINI-BAG)
0.9 % | Freq: Two times a day (BID) | INTRAVENOUS | Status: DC
Start: 2022-05-24 — End: 2022-05-25

## 2022-05-24 MED ORDER — VALPROATE SODIUM 100 MG/ML IV SOLN
100 MG/ML | Freq: Two times a day (BID) | INTRAVENOUS | Status: DC
Start: 2022-05-24 — End: 2022-05-30
  Administered 2022-05-25 – 2022-05-30 (×12): 500 mg via INTRAVENOUS

## 2022-05-24 MED FILL — DEXTROSE-NACL 10-0.45 % IV SOLN: INTRAVENOUS | Qty: 1000

## 2022-05-24 MED FILL — ISOVUE-370 76 % IV SOLN: 76 % | INTRAVENOUS | Qty: 100

## 2022-05-24 MED FILL — SODIUM CHLORIDE 0.9 % IV SOLN: 0.9 % | INTRAVENOUS | Qty: 1000

## 2022-05-24 MED FILL — VALPROATE SODIUM 100 MG/ML IV SOLN: 100 MG/ML | INTRAVENOUS | Qty: 5

## 2022-05-24 MED FILL — VALPROATE SODIUM 100 MG/ML IV SOLN: 100 MG/ML | INTRAVENOUS | Qty: 3.75

## 2022-05-24 MED FILL — NALOXONE HCL 0.4 MG/ML IJ SOLN: 0.4 MG/ML | INTRAMUSCULAR | Qty: 1

## 2022-05-24 MED FILL — ROPINIROLE HCL 1 MG PO TABS: 1 MG | ORAL | Qty: 1

## 2022-05-24 MED FILL — CITALOPRAM HYDROBROMIDE 10 MG PO TABS: 10 MG | ORAL | Qty: 1

## 2022-05-24 MED FILL — MEMANTINE HCL 10 MG PO TABS: 10 MG | ORAL | Qty: 1

## 2022-05-24 MED FILL — SODIUM CHLORIDE 0.9 % IV SOLN: 0.9 % | INTRAVENOUS | Qty: 500

## 2022-05-24 MED FILL — INSULIN LISPRO 100 UNIT/ML IJ SOLN: 100 UNIT/ML | INTRAMUSCULAR | Qty: 2

## 2022-05-24 MED FILL — SOLU-CORTEF 100 MG IJ SOLR: 100 MG | INTRAMUSCULAR | Qty: 2

## 2022-05-24 MED FILL — NOREPINEPHRINE BITARTRATE 1 MG/ML IV SOLN: 1 MG/ML | INTRAVENOUS | Qty: 16

## 2022-05-24 MED FILL — LACOSAMIDE 200 MG/20ML IV SOLN: 200 MG/20ML | INTRAVENOUS | Qty: 20

## 2022-05-24 MED FILL — DEXTROSE 10 % IV SOLN: 10 % | INTRAVENOUS | Qty: 500

## 2022-05-24 MED FILL — VANCOMYCIN HCL 10 G IV SOLR: 10 g | INTRAVENOUS | Qty: 2000

## 2022-05-24 NOTE — Progress Notes (Signed)
[facility] Dosing Services: Antimicrobial Stewardship Daily Doc  Consult for antibiotic dosing of vanc by Dr. Joneen Roach  Indication: sepsis  Day of Therapy: 1    Ht Readings from Last 1 Encounters:   05/23/22 1.676 m (5' 5.98")        Wt Readings from Last 1 Encounters:   05/23/22 81.2 kg (179 lb)      Vancomycin therapy:  Loading dose: Vancomycin 2000 mg x1 dose now/given  Maintenance dose: Vancomycin 1000 mg IV every 24 hours   Dose calculated to approximate a                   b. Trough of 15-20 mcg/mL   Last level:  mcg/mL  A/Plan: Scr 1.4 (~BL 0.9-1), WBC 9.3, afebrile. Procal ordered. T1/2 of 24h. Will start 1g q24h for now. May need adj if Cr improves tom. No levels ordered yet.       Non-Kinetic Antimicrobial Dosing Regimen:   Current Regimen:  cefepime 2g q12h  Recommendation: continue    Other Antimicrobial   (not dosed by pharmacist)    Cultures 10/10 blood x2:  10/11: MRSA:    Serum Creatinine Lab Results   Component Value Date/Time    CREATININE 1.4 05/24/2022 04:33 PM    CREATININE 1.27 05/24/2022 05:24 AM          Creatinine Clearance Estimated Creatinine Clearance: 38 mL/min (A) (based on SCr of 1.4 mg/dL (H)).     Temp Temp: 98.2 F (36.8 C)       WBC Lab Results   Component Value Date/Time    WBC 9.3 05/24/2022 05:24 AM          Procalcitonin No results found for: "PCT"     For Antifungals, Metronidazole and Nafcillin: Lab Results   Component Value Date/Time    ALT 24 05/23/2022 09:43 AM    AST 27 05/23/2022 09:43 AM        Pharmacist: Chiquita Loth, PharmD, BCPS  [phone]

## 2022-05-24 NOTE — Progress Notes (Signed)
Physician Progress Note      PATIENT:               IMAGINE, NEST  CSN #:                  [id number]  DOB:                       07-22-1946  ADMIT DATE:       05/23/2022 9:42 AM  DISCH DATE:  Earnstine Regal  PROVIDER #:        Leonard Schwartz DO          QUERY TEXT:    Good afternoon  Pt admitted with encephalopathy.    If possible, please document in progress notes and discharge summary further   specificity regarding the type of encephalopathy:    The medical record reflects the following:  Risk Factors: age, encephalopathy, dementia, hypoglycemia, fall  Clinical Indicators:  presented with concern for concussion s/p fall with nose   bleed.  Oriented to person, place and her birthday but cannot give month or year.   Pupils pinpoint, patient appears slightly lethargic. At baseline patient is   oriented x4  10/10 ED note " appear confused, follows basic commands and is conversant   (brief responses to questions) but is intermittently very somnolent and is   unable to provide much history"  Treatment: daily labs, MRI, CT head, neurology consult    Thank you  Liliane Bade RN CDI  [phone]  Options provided:  -- Metabolic encephalopathy  -- Metabolic encephalopathy superimposed on dementia  -- Encephalopathy due to ##, . Please indicate cause  -- Toxic metabolic encephalopathy  -- Wernicke?s encephalopathy  -- Other - I will add my own diagnosis  -- Disagree - Not applicable / Not valid  -- Disagree - Clinically unable to determine / Unknown  -- Refer to Clinical Documentation Reviewer    PROVIDER RESPONSE TEXT:    This patient has metabolic encephalopathy.    Query created by: Liliane Bade on 05/24/2022 12:46 PM      Electronically signed by:  Leonard Schwartz DO 05/24/2022 5:29 PM

## 2022-05-24 NOTE — Progress Notes (Addendum)
0930 Report received from ED nurse Jerene Pitch RN. All questions answered. Awaiting patient arrival on the unit.    1600 Rapid response called. RRT called at this time due to change in LOC, hypotension, and pinpoint pupils. MD and RRT RN at bedside. Orders for fluid bolus, EKG, labs, and one time dose of narcan.    1630 RRT RN Vida Roller attempted to get labs but was unsuccessful. One tube sent. Other labs still needed. Primary RN attempted twice. Loss of IV access.     1800 New labs added by MD. Patient upgraded to ICU. RRT RN Vida Roller and Liane Comber RN at bedside obtaining labs. IV access obtained. Additional 500 mL bolus started.    1845 Bed available in ICU. Patient transferred over with RRT and charge nurse. Bedside shift report given to [place] RN for routine progression of care.

## 2022-05-24 NOTE — Progress Notes (Signed)
Report received pt laying in bed requested bedpan. Pt placed on bedpan     0500  Pt laying in bed no distress noted call bell within reach     0707  Report given to [facility] including [employer]
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
## 2022-05-24 NOTE — Progress Notes (Signed)
Rapid Called at 1608    Responded to RRT at 1610 for Altered mental status, hypotension    Provider at bedside: YES  Interventions ordered: Labs and EKG  Sepsis Suspected: Yes  Transfer to Higher Level of Care: no  Blood Glucose: 117     Pt with reduced responsiveness and hypotension. MD at bedside. Fluid bolus started.       EPOC labs obtained- MD notified of results.     RN attempting to obtain additional lab work.         Rapid Ended at 1640  RRT RN assisted with transport to accepting unit NA.    Angelique Blonder, RN

## 2022-05-24 NOTE — Progress Notes (Signed)
Pharmacy Note - Renal Dosing    Cefepime 2000mg  Q8h for treatment of Sepsis of unknown etiology. Per [facility] Renal Dose Adjustment Policy, cefepime will be changed to 2000mg  load followed by 2000mg  Q12h extended infusion    Estimated Creatinine Clearance: Estimated Creatinine Clearance: 42 mL/min (A) (based on SCr of 1.27 mg/dL (H)).    BMI: Body mass index is 28.91 kg/m.    Please call with any questions.    Thank you,    Cassey Hurrell K Deisy Ozbun, RPH
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
## 2022-05-24 NOTE — Progress Notes (Signed)
Hospitalist Progress Note      NAME:  Meghan [name]   DOB:  08/21/1945  MRM:  [id number]    Date/Time: 05/24/2022  4:40 PM           Assessment / Plan:     Encephalopathy/ Hx of stroke of cerebrum/ Hx of seizure POA:   ??adrenal insufficiency v sepsis v seizure   -Unclear etiology for encephalopathy.   - pending result eeg; increased depakote   - has hypotension, is fluid responsive   - cta neg for pe   - pt came in hypoglycemia and put on glu gtt.   - add on cortisol ?could this be adrenal insuff   - appreciate neuro. Continue glucose containing fluids until can ascertain if adrenal crisis.    - check tsh; follow intake output with purewick   - empiric abx for now, de-escalate as able     AKI POA: probable ivvdl incr fluids      Hypoglycemia/  Hx Type 2 diabetes mellitus with diabetic neuropathy (HCC) POA: glu containing fluids high dose.     Thrombocytosis POA: Stroke work-up as above.  Hold home aspirin for now due to nose bleed     Nosebleed POA: Due to fall.  Check facial CT.  Afrin spray as needed     Fall POA: Caretaker states they note no bruises.  CT head and neck unremarkable as above.  PT OT once able to participate.     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  Checking Vimpat level.     Hyperlipidemia: Continue fenofibrate once can tolerate orals and med rec confirmed.     Dementia without behavioral disturbance (HCC): Resume home Aricept, Namenda once confirmed.     Parkinson's disease: Resume home  rasagline when able to tolerate PO      Depression: Continue Celexa.     Risk of deterioration: high       Total time spent with patient: 45 Minutes critical care time nonoverlapping with patient in urgent diagnosis due to hypotension and encephalopathy and hypoglycemia.Marland Kitchen very acutely ill.                                                          Care Plan discussed with: Patient and Nursing Staff     Discussed:  Care Plan      Prophylaxis:  SCD's     Probable Disposition:  SNF/LTC: needs several days 3-4    Attending Physician: Kaylyn Lim, DO        Subjective:     Chief Complaint:  syncope    Encountered bedside twice today    1st time: confused, but awake and alert, doesn't remember the fall,says her friend told her to come to hospital  2nd visit: rapid response for lethargy, is hypotensive upon arrival wakes up after bolus. Pinpoint pupils, pending lab eval, moves all extrems to command    ROS:  Denies cp sob nvd          Objective:       Vitals:          Last 24hrs VS reviewed since prior progress note. Most recent are:    Vitals:    05/24/22 1619   BP: (!) 99/51  Pulse:    Resp:    Temp:    SpO2:      SpO2 Readings from Last 6 Encounters:   05/24/22 100%   03/25/22 94%   03/25/22 99%          Intake/Output Summary (Last 24 hours) at 05/24/2022 1640  Last data filed at 05/24/2022 0800  Gross per 24 hour   Intake 150 ml   Output 150 ml   Net 0 ml          Exam:     Physical Exam:    Gen:  Well-developed, well-nourished, in no acute distress; lethargic pinpoint pupils  HEENT:  Pink conjunctivae,  hearing intact to voice, moist mucous membranes  Neck:  Supple, without masses, thyroid non-tender  Resp:  No accessory muscle use, clear breath sounds without wheezes rales or rhonchi  Card:  No murmurs, normal S1, S2 without thrills, bruits or peripheral edema  Abd:  Soft, non-tender, non-distended, normoactive bowel sounds are present  Musc:  No cyanosis or clubbing  Skin:  No rashes or ulcers, skin turgor is good  Neuro:  Cranial nerves 3-12 are grossly intact, grip strength is 5/5 bilaterally and dorsi / plantarflexion is 5/5 bilaterally, follows commands appropriately; moves all extrems to command good strength  Psych:  alert, not very well oriented but hard of hearing so difficult to tell       Telemetry reviewed:   sinus rhytm with pvcs    Medications Reviewed: (see below)    Lab Data Reviewed: (see  below)    ______________________________________________________________________    Medications:     Current Facility-Administered Medications   Medication Dose Route Frequency    glucose chewable tablet 16 g  4 tablet Oral PRN    dextrose bolus 10% 125 mL  125 mL IntraVENous PRN    Or    dextrose bolus 10% 250 mL  250 mL IntraVENous PRN    dextrose 10 % infusion   IntraVENous Continuous PRN    valproate (DEPACON) 500 mg in sodium chloride 0.9 % 50 mL infusion  500 mg IntraVENous BID    naloxone (NARCAN) injection 0.4 mg  0.4 mg IntraVENous PRN    sodium chloride 0.9 % bolus 500 mL  500 mL IntraVENous Once    vancomycin (VANCOCIN) 2000 mg in 0.9% sodium chloride 500 mL IVPB  2,000 mg IntraVENous Once    sodium chloride 0.9 % bolus 1,000 mL  1,000 mL IntraVENous Once    ceFEPIme (MAXIPIME) 2,000 mg in sterile water 20 mL IV syringe  2,000 mg IntraVENous Once    Followed by    [START ON 05/25/2022] ceFEPIme (MAXIPIME) 2,000 mg in sodium chloride 0.9 % 100 mL IVPB (mini-bag)  2,000 mg IntraVENous Q12H    sodium chloride flush 0.9 % injection 5-40 mL  5-40 mL IntraVENous 2 times per day    sodium chloride flush 0.9 % injection 5-40 mL  5-40 mL IntraVENous PRN    0.9 % sodium chloride infusion   IntraVENous PRN    ondansetron (ZOFRAN-ODT) disintegrating tablet 4 mg  4 mg Oral Q8H PRN    Or    ondansetron (ZOFRAN) injection 4 mg  4 mg IntraVENous Q6H PRN    polyethylene glycol (GLYCOLAX) packet 17 g  17 g Oral Daily PRN    acetaminophen (TYLENOL) tablet 650 mg  650 mg Oral Q6H PRN    Or    acetaminophen (TYLENOL) suppository 650 mg  650 mg Rectal Q6H PRN  dextrose 10 % and 0.45 % NaCl infusion   IntraVENous Continuous    dextrose bolus 10% 125 mL  125 mL IntraVENous PRN    Or    dextrose bolus 10% 250 mL  250 mL IntraVENous PRN    glucagon injection 1 mg  1 mg SubCUTAneous PRN    dextrose 10 % infusion   IntraVENous Continuous PRN    [Held by provider] aspirin chewable tablet 81 mg  81 mg Oral Daily    citalopram  (CELEXA) tablet 10 mg  10 mg Oral Daily    donepezil (ARICEPT) tablet 10 mg  10 mg Oral Nightly    memantine (NAMENDA) tablet 10 mg  10 mg Oral BID    rOPINIRole (REQUIP) tablet 1 mg  1 mg Oral Nightly    lacosamide (VIMPAT) injection 100 mg  100 mg IntraVENous BID            Lab Review:     Recent Labs     05/23/22  0943 05/24/22  0524   WBC 10.1 9.3   HGB 14.9 15.0   HCT 46.3 47.2*   PLT 904* 948*     Recent Labs     05/23/22  0943 05/24/22  0524   NA 140 142   K 4.6 4.4   CL 104 108   CO2 32 30   BUN 17 12   ALT 24  --      No components found for: "GLPOC"
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
## 2022-05-24 NOTE — ED Notes (Signed)
Pt easily aroused upon nurse entering room. VSS at this time. NAD.      Bari Mantis, RN  05/24/22 [phone]

## 2022-05-24 NOTE — Progress Notes (Addendum)
Running diagnosis is adrenal insufficiency, give bolus fluids, increase rate D10 1/2 NS. Start hydrocortisone pushes. Follow glucoses q1H; Transfer to ICU for q1H glucoses    K is mildly up before fluids running, Give fluids to get bp up and K controlled. Follow outs. Steroids. Recheck BMP at 2000 to ensure resolving and not requiring temporizing measures.

## 2022-05-24 NOTE — Care Coordination-Inpatient (Signed)
05/24/22 1547   Service Assessment   Patient Orientation Alert and Oriented;Person  (demention)   Cognition Short Term Memory Deficit   History Provided By Other (see comment)  (care giver)   Primary Caregiver Private caregiver   Support Systems Other (Comment)  (paid caregiver)   Patient's Healthcare Decision Maker is: Legal Next of [place]   PCP Verified by CM Yes   Last Visit to PCP Within last 3 months  (New PCP is Dr. Tommas Olp appointment set for 05/25/22)   Prior Functional Level Assistance with the following:;Bathing;Dressing;Toileting;Cooking;Housework;Shopping;Mobility   Current Functional Level Assistance with the following:;Bathing;Dressing;Toileting;Cooking;Housework;Shopping;Mobility   Can patient return to prior living arrangement Yes   Ability to make needs known: Good   Family able to assist with home care needs: Yes   Would you like for me to discuss the discharge plan with any other family members/significant others, and if so, who? Yes   Financial Resources Medicare   Social/Functional History   Lives With Home care staff   Type of [place] to Live on Main level with bedroom/bathroom;Two level   Home Access Stairs to enter with rails   Entrance Stairs - Number of Steps 4   Entrance Stairs - Rails Both   Bathroom Shower/Tub Tub/Shower unit;Shower chair with back   [place] bars in shower;Grab bars around toilet;Shower Building services engineer Help From Other (comment)  (paid care givers)   ADL Assistance   (dementia patient needs assistance with all ADLs/IADLs)   Homemaking Responsibilities No   Ambulation Assistance Needs assistance  (uses Radiation protection practitioner)   Transfer Assistance Independent   Active Driver No   Patient's [place] [phone]   Mode of Transportation Car   Occupation Retired   Dentist   Type of [place] Other (Comment)  (paid Care Givers)   [place] Medications No   Patient expects to be discharged to: [facility]   Mode of Transport at Discharge Other (see comment)  (Care giver will transport)     Care Management Initial Assessment Note:     CM spoke with [place] [phone]. Patient has dementia and has private CG. Per CG patient needs assistance with all ADL/IADLs, does ambulate with a Rollator and can feed self. CG provides transportation to appointments. Patient last saw PCP about two months ago. Patient has scheduled PCP appointment with new provider Dr. Tommas Olp on Thursday 05/24/22. Patient uses [facility] for mail order meds. Per CG patient with no hx of hh has been to [facility]. CM following for d/c planning needs.     ______________________  Idelle Crouch BSN, RN  Care Management  05/24/2022  4:18 PM

## 2022-05-25 ENCOUNTER — Inpatient Hospital Stay: Admit: 2022-05-25 | Discharge: 2022-05-30 | Payer: MEDICARE | Primary: Internal Medicine

## 2022-05-25 LAB — CBC WITH AUTO DIFFERENTIAL
Absolute Immature Granulocyte: 0 10*3/uL (ref 0.00–0.04)
Basophils %: 1 % (ref 0–1)
Basophils Absolute: 0 10*3/uL (ref 0.0–0.1)
Eosinophils %: 0 % (ref 0–7)
Eosinophils Absolute: 0 10*3/uL (ref 0.0–0.4)
Hematocrit: 32.9 % — ABNORMAL LOW (ref 35.0–47.0)
Hemoglobin: 10.3 g/dL — ABNORMAL LOW (ref 11.5–16.0)
Immature Granulocytes: 0 % (ref 0.0–0.5)
Lymphocytes %: 15 % (ref 12–49)
Lymphocytes Absolute: 1.3 10*3/uL (ref 0.8–3.5)
MCH: 29.9 PG (ref 26.0–34.0)
MCHC: 31.3 g/dL (ref 30.0–36.5)
MCV: 95.4 FL (ref 80.0–99.0)
MPV: 10.4 FL (ref 8.9–12.9)
Monocytes %: 9 % (ref 5–13)
Monocytes Absolute: 0.8 10*3/uL (ref 0.0–1.0)
Neutrophils %: 75 % (ref 32–75)
Neutrophils Absolute: 6.2 10*3/uL (ref 1.8–8.0)
Nucleated RBCs: 0 PER 100 WBC
Platelets: 740 10*3/uL — ABNORMAL HIGH (ref 150–400)
RBC: 3.45 M/uL — ABNORMAL LOW (ref 3.80–5.20)
RDW: 15.2 % — ABNORMAL HIGH (ref 11.5–14.5)
WBC: 8.3 10*3/uL (ref 3.6–11.0)
nRBC: 0 10*3/uL (ref 0.00–0.01)

## 2022-05-25 LAB — POCT GLUCOSE
POC Glucose: 177 mg/dL — ABNORMAL HIGH (ref 65–117)
POC Glucose: 240 mg/dL — ABNORMAL HIGH (ref 65–117)
POC Glucose: 216 mg/dL — ABNORMAL HIGH (ref 65–117)
POC Glucose: 204 mg/dL — ABNORMAL HIGH (ref 65–117)
POC Glucose: 90 mg/dL (ref 65–117)
POC Glucose: 101 mg/dL (ref 65–117)
POC Glucose: 144 mg/dL — ABNORMAL HIGH (ref 65–117)
POC Glucose: 124 mg/dL — ABNORMAL HIGH (ref 65–117)

## 2022-05-25 LAB — LACTIC ACID
Lactic Acid, Plasma: 3.7 MMOL/L (ref 0.4–2.0)
Lactic Acid, Plasma: 3.3 MMOL/L (ref 0.4–2.0)
Lactic Acid, Plasma: 3.2 MMOL/L (ref 0.4–2.0)

## 2022-05-25 LAB — BASIC METABOLIC PANEL W/ REFLEX TO MG FOR LOW K
Anion Gap: 5 mmol/L (ref 5–15)
BUN: 10 MG/DL (ref 6–20)
Bun/Cre Ratio: 9 — ABNORMAL LOW (ref 12–20)
CO2: 23 mmol/L (ref 21–32)
Calcium: 7.7 MG/DL — ABNORMAL LOW (ref 8.5–10.1)
Chloride: 117 mmol/L — ABNORMAL HIGH (ref 97–108)
Creatinine: 1.1 MG/DL — ABNORMAL HIGH (ref 0.55–1.02)
Est, Glom Filt Rate: 53 mL/min/{1.73_m2} — ABNORMAL LOW (ref >60)
Glucose: 168 mg/dL — ABNORMAL HIGH (ref 65–100)
Potassium: 4.2 mmol/L (ref 3.5–5.1)
Sodium: 145 mmol/L (ref 136–145)

## 2022-05-25 LAB — HEPATIC FUNCTION PANEL
ALT: 13 U/L (ref 12–78)
AST: 17 U/L (ref 15–37)
Albumin/Globulin Ratio: 0.9 — ABNORMAL LOW (ref 1.1–2.2)
Albumin: 2.6 g/dL — ABNORMAL LOW (ref 3.5–5.0)
Alk Phosphatase: 56 U/L (ref 45–117)
Bilirubin, Direct: 0.1 MG/DL (ref 0.0–0.2)
Globulin: 3 g/dL (ref 2.0–4.0)
Total Bilirubin: 0.3 MG/DL (ref 0.2–1.0)
Total Protein: 5.6 g/dL — ABNORMAL LOW (ref 6.4–8.2)

## 2022-05-25 LAB — EKG 12-LEAD
Atrial Rate: 83 {beats}/min
Diagnosis: NORMAL
P Axis: 55 degrees
P-R Interval: 136 ms
Q-T Interval: 366 ms
QRS Duration: 66 ms
QTc Calculation (Bazett): 430 ms
R Axis: 58 degrees
T Axis: 73 degrees
Ventricular Rate: 83 {beats}/min

## 2022-05-25 LAB — RENAL FUNCTION PANEL
Albumin: 2.6 g/dL — ABNORMAL LOW (ref 3.5–5.0)
Anion Gap: 7 mmol/L (ref 5–15)
BUN: 10 MG/DL (ref 6–20)
Bun/Cre Ratio: 8 — ABNORMAL LOW (ref 12–20)
CO2: 23 mmol/L (ref 21–32)
Calcium: 7.3 MG/DL — ABNORMAL LOW (ref 8.5–10.1)
Chloride: 113 mmol/L — ABNORMAL HIGH (ref 97–108)
Creatinine: 1.33 MG/DL — ABNORMAL HIGH (ref 0.55–1.02)
Est, Glom Filt Rate: 42 mL/min/{1.73_m2} — ABNORMAL LOW (ref >60)
Glucose: 640 mg/dL (ref 65–100)
Phosphorus: 1.4 MG/DL — ABNORMAL LOW (ref 2.6–4.7)
Potassium: 3.8 mmol/L (ref 3.5–5.1)
Sodium: 143 mmol/L (ref 136–145)

## 2022-05-25 LAB — BLOOD GAS, ARTERIAL
Base deficit, arterial blood: 3 mmol/L
HCO3, Arterial: 23 mmol/L (ref 22–26)
POC O2 SAT: 92 % (ref 92–97)
pCO2, Arterial: 44 mmHg (ref 35–45)
pH, Arterial: 7.34 — ABNORMAL LOW (ref 7.35–7.45)
pO2, Arterial: 67 mmHg — ABNORMAL LOW (ref 80–100)

## 2022-05-25 LAB — TSH + FREE T4 PANEL
T4 Free: 0.9 NG/DL (ref 0.8–1.5)
TSH, 3RD GENERATION: 0.29 u[IU]/mL — ABNORMAL LOW (ref 0.36–3.74)

## 2022-05-25 LAB — MAGNESIUM: Magnesium: 1.4 mg/dL — ABNORMAL LOW (ref 1.6–2.4)

## 2022-05-25 LAB — CORTISOL TOTAL: Cortisol, Random: 46.9 ug/dL

## 2022-05-25 MED ORDER — SODIUM CHLORIDE 0.9 % IV BOLUS
0.9 % | Freq: Once | INTRAVENOUS | Status: AC
Start: 2022-05-25 — End: 2022-05-25
  Administered 2022-05-25: 05:00:00 1000 mL via INTRAVENOUS

## 2022-05-25 MED ORDER — SODIUM CHLORIDE 0.9 % IV SOLN (MINI-BAG)
0.9 % | Freq: Two times a day (BID) | INTRAVENOUS | Status: DC
Start: 2022-05-25 — End: 2022-05-25

## 2022-05-25 MED ORDER — ENOXAPARIN SODIUM 40 MG/0.4ML IJ SOSY
40 MG/0.4ML | Freq: Every day | INTRAMUSCULAR | Status: DC
Start: 2022-05-25 — End: 2022-05-30
  Administered 2022-05-25 – 2022-05-28 (×4): 40 mg via SUBCUTANEOUS

## 2022-05-25 MED ORDER — DEXTROSE-NACL 5-0.45 % IV SOLN
INTRAVENOUS | Status: DC
Start: 2022-05-25 — End: 2022-05-25
  Administered 2022-05-25: 03:00:00 via INTRAVENOUS

## 2022-05-25 MED ORDER — STERILE WATER FOR INJECTION (MIXTURES ONLY)
2 g | Freq: Once | INTRAVENOUS | Status: AC
Start: 2022-05-25 — End: 2022-05-25
  Administered 2022-05-25: 09:00:00 2000 mg via INTRAVENOUS

## 2022-05-25 MED ORDER — MAGNESIUM SULFATE 2 GM/50ML IV SOLN
2 GM/50ML | Freq: Once | INTRAVENOUS | Status: AC
Start: 2022-05-25 — End: 2022-05-25
  Administered 2022-05-25: 06:00:00 2000 mg via INTRAVENOUS

## 2022-05-25 MED ORDER — SODIUM CHLORIDE 0.9 % IV SOLN
0.9 % | INTRAVENOUS | Status: DC
Start: 2022-05-25 — End: 2022-05-27
  Administered 2022-05-25 – 2022-05-27 (×3): via INTRAVENOUS

## 2022-05-25 MED ORDER — MIDODRINE HCL 5 MG PO TABS
5 MG | Freq: Three times a day (TID) | ORAL | Status: DC
Start: 2022-05-25 — End: 2022-05-27
  Administered 2022-05-25: 22:00:00 5 mg via ORAL

## 2022-05-25 MED FILL — SOLU-CORTEF 100 MG IJ SOLR: 100 MG | INTRAMUSCULAR | Qty: 2

## 2022-05-25 MED FILL — CITALOPRAM HYDROBROMIDE 20 MG PO TABS: 20 MG | ORAL | Qty: 1

## 2022-05-25 MED FILL — LACOSAMIDE 200 MG/20ML IV SOLN: 200 MG/20ML | INTRAVENOUS | Qty: 20

## 2022-05-25 MED FILL — SODIUM CHLORIDE 0.9 % IV SOLN: 0.9 % | INTRAVENOUS | Qty: 1000

## 2022-05-25 MED FILL — MEMANTINE HCL 10 MG PO TABS: 10 MG | ORAL | Qty: 1

## 2022-05-25 MED FILL — VALPROATE SODIUM 100 MG/ML IV SOLN: 100 MG/ML | INTRAVENOUS | Qty: 5

## 2022-05-25 MED FILL — DONEPEZIL HCL 5 MG PO TABS: 5 MG | ORAL | Qty: 2

## 2022-05-25 MED FILL — MIDODRINE HCL 5 MG PO TABS: 5 MG | ORAL | Qty: 1

## 2022-05-25 MED FILL — ROPINIROLE HCL 1 MG PO TABS: 1 MG | ORAL | Qty: 1

## 2022-05-25 MED FILL — CEFEPIME HCL 2 G IV SOLR: 2 g | INTRAVENOUS | Qty: 2

## 2022-05-25 MED FILL — ENOXAPARIN SODIUM 40 MG/0.4ML IJ SOSY: 40 MG/0.4ML | INTRAMUSCULAR | Qty: 0.4

## 2022-05-25 MED FILL — ALBUTEIN 25 % IV SOLN: 25 % | INTRAVENOUS | Qty: 100

## 2022-05-25 MED FILL — DEXTROSE-NACL 5-0.45 % IV SOLN: INTRAVENOUS | Qty: 1000

## 2022-05-25 MED FILL — MAGNESIUM SULFATE 2 GM/50ML IV SOLN: 2 GM/50ML | INTRAVENOUS | Qty: 50
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
## 2022-05-25 NOTE — Care Coordination-Inpatient (Signed)
Case Management Assessment  Initial Evaluation    Date/Time of Evaluation: 05/25/2022 10:56 AM  Assessment Completed by: Darci Current Jamahl Lemmons,RN    If patient is discharged prior to next notation, then this note serves as note for discharge by case management.    Patient Name: Meghan [name]                   Date of Birth: Mar 15, 1946  Diagnosis: Encephalopathy [G93.40]  Acute encephalopathy [G93.40]  Closed head injury, initial encounter [Y40.34VQ]  Unwitnessed fall [R29.6]                   Date / Time: 05/23/2022  9:42 AM    Patient Admission Status: Inpatient   Readmission Risk (Low < 19, Mod (19-27), High > 27): No data recorded  Current PCP: Gwenith Spitz, MD  PCP verified by CM? Yes    Chart Reviewed: Yes      History Provided by: Other (see comment) (care giver)  Patient Orientation: Alert and Oriented, Person (demention)    Patient Cognition: Short Term Memory Deficit    Hospitalization in the last 30 days (Readmission):  No    If yes, Readmission Assessment in CM Navigator will be completed.    Advance Directives:      Code Status: Full Code   Patient's Primary Decision Maker is: Pt has a son who lives in [place] who will be pt.'s Marine scientist.The private home where pt resides will be obtaining his number to give to me shortly and I will document separately.      Discharge Planning:    Patient lives with: Other (Comment) (paid Care Givers) Type of Home: House  Primary Care Giver: Private caregiver  Patient Support Systems include: Other (Comment) (paid caregiver)   Current Financial resources: Medicare  Current community resources: private home with private duty caregivers through the long term care waiver.  Current services prior to admission:  private caregivers through LTC waiver            Current DME:  shower chair with back,grab bars,rollator            Type of Home Care services:   Pt is seen on a regular basis by the home's physician,Dr Tommas Olp.Per the home,pt will be seen by Dr  Melina Fiddler on Thursday,October 19 th.    ADLS  Prior functional level: Assistance with the following:, Bathing, Dressing, Toileting, Cooking, Housework, Shopping, Mobility  Current functional level: Assistance with the following:, Bathing, Dressing, Toileting, Cooking, Housework, Shopping, Mobility    PT AM-PAC:   /24  OT AM-PAC:   /24    Family can provide assistance at DC: Yes  Would you like Case Management to discuss the discharge plan with any other family members/significant others, and if so, who? Yes-caregivers or son  Plans to Return to Present Housing: Yes  Other Identified Issues/Barriers to RETURNING to current housing: currently weaning pt off of levophed,adding midodrine,monitoring bloos sugars every 6 hours,adjusting seizure medications  Potential Assistance needed at discharge:              Potential DME:    Patient expects to discharge to: [place] for transportation at discharge:  private caretakers will likely transport pt     Financial    Payor: [facility] / Plan: MEDICARE PART A AND B / Product Type: *No Product type* /     Does insurance require precert for SNF: No    Potential assistance Purchasing  Medications: No  Meds-to-Beds request:  no      [id number] - [place], [place] - [address] - P [phone] Carmon Ginsberg [phone]  [address]  [place] [address]  Phone: [phone] Fax: [phone]    Home prefers for scripts to be electronically sent to [facility] and they will be filled & delivered for the private home on the same day of discharge if escripted early in the day    Notes:    Factors facilitating achievement of predicted outcomes: Caregiver support    Barriers to discharge: currently pt is on a vasopressor    Additional Case Management Notes:     Pt has a private [employer] @ [phone].  Pt requires assistance @ home with all ADLS/iADLS.  Pt is capable of feeding herself.  At home,pt uses a rollator to ambulate.  In the past,pt was in SNF  for rehab @ [facility].  I am continuing to follow pt for discharge needs.      The Plan for Transition of Care is related to the following treatment goals of Encephalopathy [G93.40]  Acute encephalopathy [G93.40]  Closed head injury, initial encounter [S09.90XA]  Unwitnessed fall [R29.6]    IF APPLICABLE: The Patient and/or patient representative Ardell and her family were provided with a choice of provider and agrees with the discharge plan. Freedom of choice list with basic dialogue that supports the patient's individualized plan of care/goals and shares the quality data associated with the providers was provided to: [facility]    Patient Representative Name:  Lequita Halt spencer or son,(Home could not recall his name but his number is [phone] will be coming tomorrow from [place]    The Patient and/or Patient Representative Agree with the Discharge Plan?  No discharge plans formulated @ this time but the tentative plan is for pt to return to the private home with around the clock caregivers    Zoila Shutter  Case Management Department  Ph: [phone] Fax: [phone]

## 2022-05-25 NOTE — Plan of Care (Signed)
Problem: Physical Therapy - Adult  Goal: By Discharge: Performs mobility at highest level of function for planned discharge setting.  See evaluation for individualized goals.  Description: FUNCTIONAL STATUS PRIOR TO ADMISSION: Patient was modified independent using a rolling walker for functional mobility. The patient lives in [place] and is in town visiting her sister who lives locally.     HOME SUPPORT PRIOR TO ADMISSION: The patient lived with her significant other in [place] but did not require assistance. She reports 2 falls in the past month.     Physical Therapy Goals  Initiated 05/24/2022  1.  Patient will move from supine to sit and sit to supine, scoot up and down, and roll side to side in bed with independence within 7 day(s).    2.  Patient will perform sit to stand with contact guard assist within 7 day(s).  3.  Patient will transfer from bed to chair and chair to bed with supervision/set-up using the least restrictive device within 7 day(s).  4.  Patient will ambulate with supervision/set-up for 50 feet with the least restrictive device within 7 day(s).   5.  Patient will ascend/descend 4 stairs with 1-2 handrail(s) with contact guard assist within 7 day(s).    Outcome: Progressing     PHYSICAL THERAPY TREATMENT    Patient: Meghan [name] (76 y.o. female)  Date: 05/25/2022  Diagnosis: Encephalopathy [G93.40]  Acute encephalopathy [G93.40]  Closed head injury, initial encounter [S09.90XA]  Unwitnessed fall [R29.6] Encephalopathy      Precautions: Fall Risk                    ASSESSMENT:  Patient continues to benefit from skilled PT services and is progressing towards goals. Patient remains most limited by impaired cognition, activity tolerance, gait, balance, safety awareness. Patient alert and oriented x 4 though presents with intermittent confusion and requires frequent redirection to maintain attention on task at hand. Patient progressed well this date, performing bed mobility, transfers, and gait with  CGA. Patient transfers and ambulates with accelerated pace and mild impulsivity, requiring firm verbal cues to improve safety awareness with little carryover of education noted. Family not present to confirm patient's prior level or level of assistance provided at her group home in [place]. However, if patient is able to receive supervision at home with all out of bed activity, she will ultimately be safe to return home. If not, patient will need SNF primarily due to cognitive deficit and decreased safety.        PLAN:  Patient continues to benefit from skilled intervention to address the above impairments.  Continue treatment per established plan of care.    Recommendation for discharge: (in order for the patient to meet his/her long term goals): return to group home    Other factors to consider for discharge: poor safety awareness and impaired cognition    IF patient discharges home will need the following DME: patient owns DME required for discharge       SUBJECTIVE:   Patient stated, "is that my rolling walker? Mine has my name on it."    OBJECTIVE DATA SUMMARY:   Critical Behavior:  Orientation  Overall Orientation Status: Within Normal Limits  Orientation Level: Oriented X4  Cognition  Overall Cognitive Status: Exceptions  Arousal/Alertness: Appropriate responses to stimuli  Following Commands: Follows one step commands consistently  Attention Span: Difficulty attending to directions;Attends with cues to redirect  Memory: Decreased recall of recent events;Decreased long term memory  Safety  Judgement: Decreased awareness of need for safety;Decreased awareness of need for assistance  Problem Solving: Decreased awareness of errors  Insights: Decreased awareness of deficits  Initiation: Requires cues for some  Sequencing: Requires cues for some    Functional Mobility Training:  Bed Mobility:  Bed Mobility Training  Bed Mobility Training: Yes  Interventions: Verbal cues;Safety awareness training  Supine to Sit:  Contact-guard assistance  Scooting: Contact-guard assistance  Transfers:  Art therapist: Yes  Interventions: Verbal cues  Sit to Stand: Contact-guard assistance  Stand to Sit: Contact-guard assistance  Toilet Transfer: Contact-guard assistance  Balance:  Balance  Sitting - Static: Good (unsupported)  Sitting - Dynamic: Fair (occasional)  Standing: Impaired  Standing - Static: Fair;Unsupported;Good  Standing - Dynamic: Fair;Constant support   Ambulation/Gait Training:     Gait  Overall Level of Assistance: Contact-guard assistance  Interventions: Verbal cues;Safety awareness training  Base of Support: Narrowed  Speed/Cadence: Accelerated  Step Length: Left shortened;Right shortened  Gait Abnormalities: Path deviations;Trunk sway increased  Distance (ft): 200 Feet  Assistive Device: Gait belt;Walker, rolling              Pain Rating:  0/10   Pain Intervention(s):       Activity Tolerance:   Good and tolerates ADLS without rest breaks  Vital signs stable, highest HR up to 127 bpm  BP fluctuated between high and low with no s/s OH    After treatment:   Patient left in no apparent distress sitting up in chair, Call bell within reach, and Bed/ chair alarm activated      COMMUNICATION/EDUCATION:   The patient's plan of care was discussed with: occupational therapist, registered nurse, and case manager    Patient Education  Education Given To: Patient  Education Provided: Role of Arboriculturist  Education Provided Comments: Safety awareness with transfers and gait  Education Method: Verbal  Barriers to Learning: Cognition;Hearing  Education Outcome: Continued education needed;Unable to demonstrate understanding      Ashley Royalty, PT  Minutes: 29

## 2022-05-25 NOTE — Progress Notes (Signed)
Hospitalist Progress Note      NAME:  Meghan [name]   DOB:  March 01, 1946  MRM:  [id number]    Date/Time: 05/25/2022  2:52 PM           Assessment / Plan:     Encephalopathy/ Hx of stroke of cerebrum/ Hx of seizure POA:   Shock  Hypoglycemia  ??adrenal insufficiency v sepsis v seizure  - improving drastically with steroids  - cta neg, eeg no seiz  - adding fluids to assist coming off pressors  - stop abx as looking more like adrenal crisis and no infection noted.  - check acth and cortisol in the am, pending C peptide, insulin labs; tsh low which could point to secondary or tertiary adrenal insuff) - consult to endocrinology for assistance     AKI POA: add back fluids     Hypoglycemia/  Hx Type 2 diabetes mellitus with diabetic neuropathy (HCC) POA:   - off glucose containing gtt. Now with stable glucoses after steroids initiation.  - sugar checks q6     Thrombocytosis POA: Stroke work-up as above.  Hold home aspirin for now due to nose bleed     Nosebleed POA: Due to fall.  Check facial CT.  Afrin spray as needed     Fall POA: Caretaker states they note no bruises.  CT head and neck unremarkable as above.  PT OT once able to participate.     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  Checking Vimpat level. Neuro advancing depakote therapy, eeg no seiz but not normal either     Hyperlipidemia: Continue fenofibrate once can tolerate orals and med rec confirmed.     Dementia without behavioral disturbance (HCC): Resume home Aricept, Namenda once confirmed.     Parkinson's disease: Resume home  rasagline when able to tolerate PO      Depression: Continue Celexa.     Risk of deterioration: high       Total time spent with patient: 45 Minutes critical care time nonoverlapping with patient in urgent diagnosis due to hypotension and encephalopathy and hypoglycemia.Marland Kitchen very acutely ill.                                                          Care  Plan discussed with: Patient and Nursing Staff     Discussed:  Care Plan     Prophylaxis:  SCD's     Probable Disposition:  icu, to floor likely in 24 hours    Attending Physician: Kaylyn Lim, DO        Subjective:     Chief Complaint:  syncope    Encountered bedside  Nad, eating drinking now  Fam updated  No cp sob nvd    ROS:  Denies cp sob nvd          Objective:       Vitals:          Last 24hrs VS reviewed since prior progress note. Most recent are:    Vitals:    05/25/22 1103   BP: (!) 99/49   Pulse: 68   Resp:    Temp:    SpO2:      SpO2 Readings from Last 6 Encounters:   05/25/22  100%   03/25/22 94%   03/25/22 99%          Intake/Output Summary (Last 24 hours) at 05/25/2022 1452  Last data filed at 05/25/2022 1024  Gross per 24 hour   Intake 6484.05 ml   Output 300 ml   Net 6184.05 ml          Exam:     Physical Exam:    Gen:  Well-developed, well-nourished, in no acute distress; awake alert without issue  HEENT:  Pink conjunctivae,  hearing intact to voice, moist mucous membranes  Neck:  Supple, without masses, thyroid non-tender  Resp:  No accessory muscle use, clear breath sounds without wheezes rales or rhonchi  Card:  No murmurs, normal S1, S2 without thrills, bruits or peripheral edema  Abd:  Soft, non-tender, non-distended, normoactive bowel sounds are present  Musc:  No cyanosis or clubbing  Skin:  No rashes or ulcers, skin turgor is good  Neuro:  Cranial nerves 3-12 are grossly intact, grip strength is 5/5 bilaterally and dorsi / plantarflexion is 5/5 bilaterally, follows commands appropriately; moves all extrems to command good strength  Psych:  alert, not very well oriented but hard of hearing so difficult to tell       Telemetry reviewed:   sinus rhytm with pvcs    Medications Reviewed: (see below)    Lab Data Reviewed: (see below)    ______________________________________________________________________    Medications:     Current Facility-Administered Medications   Medication Dose Route  Frequency    ceFEPIme (MAXIPIME) 2,000 mg in sodium chloride 0.9 % 100 mL IVPB (mini-bag)  2,000 mg IntraVENous Q12H    midodrine (PROAMATINE) tablet 5 mg  5 mg Oral TID WC    glucose chewable tablet 16 g  4 tablet Oral PRN    dextrose bolus 10% 125 mL  125 mL IntraVENous PRN    Or    dextrose bolus 10% 250 mL  250 mL IntraVENous PRN    dextrose 10 % infusion   IntraVENous Continuous PRN    valproate (DEPACON) 500 mg in sodium chloride 0.9 % 50 mL infusion  500 mg IntraVENous BID    naloxone (NARCAN) injection 0.4 mg  0.4 mg IntraVENous PRN    hydrocortisone sodium succinate PF (SOLU-CORTEF) 50 mg in sterile water 2 mL injection  50 mg IntraVENous Q8H    vancomycin (VANCOCIN) 1,000 mg in sodium chloride 0.9 % 250 mL IVPB (Vial2Bag)  1,000 mg IntraVENous Q24H    norepinephrine (LEVOPHED) 16 mg in sodium chloride 0.9 % 250 mL infusion  1-100 mcg/min IntraVENous Continuous    sodium chloride flush 0.9 % injection 5-40 mL  5-40 mL IntraVENous 2 times per day    sodium chloride flush 0.9 % injection 5-40 mL  5-40 mL IntraVENous PRN    0.9 % sodium chloride infusion   IntraVENous PRN    ondansetron (ZOFRAN-ODT) disintegrating tablet 4 mg  4 mg Oral Q8H PRN    Or    ondansetron (ZOFRAN) injection 4 mg  4 mg IntraVENous Q6H PRN    polyethylene glycol (GLYCOLAX) packet 17 g  17 g Oral Daily PRN    acetaminophen (TYLENOL) tablet 650 mg  650 mg Oral Q6H PRN    Or    acetaminophen (TYLENOL) suppository 650 mg  650 mg Rectal Q6H PRN    dextrose bolus 10% 125 mL  125 mL IntraVENous PRN    Or    dextrose bolus 10% 250 mL  250  mL IntraVENous PRN    glucagon injection 1 mg  1 mg SubCUTAneous PRN    dextrose 10 % infusion   IntraVENous Continuous PRN    [Held by provider] aspirin chewable tablet 81 mg  81 mg Oral Daily    citalopram (CELEXA) tablet 10 mg  10 mg Oral Daily    donepezil (ARICEPT) tablet 10 mg  10 mg Oral Nightly    memantine (NAMENDA) tablet 10 mg  10 mg Oral BID    rOPINIRole (REQUIP) tablet 1 mg  1 mg Oral Nightly     lacosamide (VIMPAT) injection 100 mg  100 mg IntraVENous BID            Lab Review:     Recent Labs     05/24/22  0524 05/24/22  1755 05/25/22  0147   WBC 9.3 7.9 8.3   HGB 15.0 12.5 10.3*   HCT 47.2* 39.5 32.9*   PLT 948* 867* 740*     Recent Labs     05/23/22  0943 05/24/22  0524 05/24/22  1755 05/24/22  2317 05/25/22  0147   NA 140   < > 145 143 145   K 4.6   < > 4.7 3.8 4.2   CL 104   < > 113* 113* 117*   CO2 32   < > 29 23 23    BUN 17   < > 12 10 10    MG  --   --   --  1.4*  --    PHOS  --   --   --  1.4*  --    ALT 24  --   --  13  --     < > = values in this interval not displayed.     No components found for: "GLPOC"

## 2022-05-25 NOTE — Plan of Care (Signed)
Problem: Skin/Tissue Integrity  Goal: Absence of new skin breakdown  Description: 1.  Monitor for areas of redness and/or skin breakdown  2.  Assess vascular access sites hourly  3.  Every 4-6 hours minimum:  Change oxygen saturation probe site  4.  Every 4-6 hours:  If on nasal continuous positive airway pressure, respiratory therapy assess nares and determine need for appliance change or resting period.  Outcome: Progressing     Problem: Safety - Adult  Goal: Free from fall injury  Outcome: Progressing     Problem: Chronic Conditions and Co-morbidities  Goal: Patient's chronic conditions and co-morbidity symptoms are monitored and maintained or improved  Outcome: Progressing     Problem: Discharge Planning  Goal: Discharge to home or other facility with appropriate resources  Outcome: Progressing

## 2022-05-25 NOTE — Progress Notes (Signed)
Nutrition Note    Discussed during IDRs; patient on 4 carb choice diet order with WDL recent A1C check. POC glucose checks have been WDL. RD to discontinue 4 carb choice restriction.    Meal Intake:   Patient Vitals for the past 168 hrs:   PO Meals Eaten (%)   05/25/22 0800 76 - 100%       Nutr. Labs:  Lab Results   Component Value Date/Time    POCGLU 124 05/25/2022 09:58 AM    POCGLU 144 05/25/2022 08:30 AM    POCGLU 101 05/25/2022 06:29 AM    POCGLU 90 05/25/2022 04:53 AM    POCGLU 204 05/25/2022 01:03 AM    POCGLU 216 05/25/2022 12:16 AM        Hemoglobin A1C   Date Value Ref Range Status   05/24/2022 5.4 4.0 - 5.6 % Final     Comment:     (NOTE)  HbA1C Interpretive Ranges  <5.7              Normal  5.7 - 6.4         Consider Prediabetes  >6.5              Consider Diabetes       Hemoglobin A1C, POC   Date Value Ref Range Status   01/08/2018 5.4 % Final       Electronically signed by Ola Spurr, [place] RD on 16/10/96 at 12:50 PM EDT    Contact: Ext: [phone], or via [employer]
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
## 2022-05-25 NOTE — Significant Event (Addendum)
Notified by nursing of elevated lactate of 3.7, made aware of Basic drawn near IV site so concern for elevated glucose is not warranted, accu check with glucose of 216 - Normal saline bolus begun due to elevated lactate at this time. Will hold D5.45NS during bolus, will continue to monitor glucose- will follow closely    0110: Notified of acute drop in BP 80/40, pt sleeping , will arouse but not able to maintain awakeness. , blood sugar remains 204-     0120: Pt seen at bedside, appears to be resting, resp easy and non labored, sats > 95%, however pt is slow to respond to name, min response to sternal rub, brief eye opening with extensive stimulation but back to sleep quickly. BP remains low, NS bolus infusing , levophed to be started.  Mag at 1.4, bolus ordered.    Neuro. Sluggish, slow to respond, difficult to maintain awakeness  CV: HRR,, S1,S2, strong radial pulses, BP low, maps of 40's  RSP:  Lungs CTA, resp easy and non-labored, in RA  GI: Abd soft, + bowel sound, non distended  Integ: Skin warm, dry, MM mosit and pink    0140: ICU consult placed, call to MD- Dr. Oran Rein, provided brief update on pt, will eval for further needs.     0145: BP responding with maps at 63-65 with Levophed infusing at 5.

## 2022-05-25 NOTE — Care Coordination-Inpatient (Signed)
Private home states they could not recall the name of pt.'s son but his number is [phone].  Son will be arriving from [facility] tomorrow.  Caregiver @ home is requesting an update from attending when possible.        Weston Anna
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
## 2022-05-25 NOTE — Care Coordination-Inpatient (Signed)
PT /OT informed me that pt is okay to return back to the private home with 24/7 caretakers.  Pt informed PT that her caretakers had brought her RW here to the hospital but she is not sure where it is,I will contact caretakers in am.    Weston Anna

## 2022-05-25 NOTE — Plan of Care (Signed)
Problem: Occupational Therapy - Adult  Goal: By Discharge: Performs self-care activities at highest level of function for planned discharge setting.  See evaluation for individualized goals.  Description: FUNCTIONAL STATUS PRIOR TO ADMISSION:  Pt is independent with ADLs and mobility.   ,  ,  ,  ,  ,  ,  ,  ,  ,  ,       HOME SUPPORT: Patient is a poor historian, reporting she is visiting from out of town ([place]) and is staying with her sister here in [place]. Per chart she resides in a Group Home.    Occupational Therapy Goals:  Initiated 05/24/2022  1.  Patient will perform grooming, standing at sink, with Modified Independence within 7 day(s).  2.  Patient will perform lower body dressing with Modified Independence within 7 day(s).  3.  Patient will perform bathing, sitting and standing PRN, with Supervision within 7 day(s).  4.  Patient will perform toilet transfers with Modified Independence  within 7 day(s).  5.  Patient will perform all aspects of toileting with Modified Independence within 7 day(s).  6.  Patient will participate in upper extremity therapeutic exercise/activities with Independence for 10 minutes within 7 day(s).    Outcome: Progressing     OCCUPATIONAL THERAPY TREATMENT  Patient: Meghan [name] (76 y.o. female)  Date: 05/25/2022  Primary Diagnosis: Encephalopathy [G93.40]  Acute encephalopathy [G93.40]  Closed head injury, initial encounter [S09.90XA]  Unwitnessed fall [R29.6]       Precautions: Fall Risk                Chart, occupational therapy assessment, plan of care, and goals were reviewed.    ASSESSMENT  Patient continues to benefit from skilled OT services and is progressing towards goals. Patient today more oriented but continues with overall impaired cognition, is impulsive with intermittent confusion, poor safety awareness, decreased insight into deficits, and decreased attention, which is further compounded on HOH. Pt offers good efforts and is motivated to  participate with therapy. She performs serial ADL tasks, including toileting and distal LB dressing with overall CGA to occasional min A and requires frequent cues for safety and pacing of activity. Noted fair to poor carry-over of cues, especially with safety with RW, and therefore requires near constant supervision for all upright activity. Saturations stable on RA and HR up to 127 bpm with prolonged activity.     Discussed with CM - patient has 24/7 staff support at private home. Recommend return there with follow up [facility] therapy services.          PLAN :  Patient continues to benefit from skilled intervention to address the above impairments.  Continue treatment per established plan of care to address goals.    Recommend with staff: OOB to chair for all meals; mobility to bathroom for toileting; ADLs as needed    Recommendation for discharge: (in order for the patient to meet his/her long term goals): return to private group home with 24/7 care/supervision and assist for all OOB activity    Other factors to consider for discharge: poor safety awareness and impaired cognition    IF patient discharges home will need the following DME: patient owns DME required for discharge       SUBJECTIVE:   Patient stated "I don't like [place]."    OBJECTIVE DATA SUMMARY:   Cognitive/Behavioral Status:  Orientation  Overall Orientation Status: Within Normal Limits  Orientation Level: Oriented X4  Cognition  Overall Cognitive Status:  Exceptions  Arousal/Alertness: Appropriate responses to stimuli  Following Commands: Follows one step commands consistently  Attention Span: Difficulty attending to directions;Attends with cues to redirect  Memory: Decreased recall of recent events;Decreased long term memory  Safety Judgement: Decreased awareness of need for safety;Decreased awareness of need for assistance  Problem Solving: Decreased awareness of errors  Insights: Decreased awareness of deficits  Initiation: Requires cues for  some  Sequencing: Requires cues for some    Functional Mobility and Transfers for ADLs:  Bed Mobility:  Bed Mobility Training  Bed Mobility Training: Yes  Interventions: Verbal cues;Safety awareness training  Supine to Sit: Contact-guard assistance  Scooting: Contact-guard assistance     Transfers:   Art therapist: Yes  Interventions: Verbal cues  Sit to Stand: Contact-guard assistance  Stand to Sit: Contact-guard assistance  Toilet Transfer: Contact-guard assistance    Balance:  Standing: Impaired  Balance  Sitting - Static: Good (unsupported)  Sitting - Dynamic: Fair (occasional)  Standing: Impaired  Standing - Static: Fair;Unsupported;Good  Standing - Dynamic: Fair;Constant support    ADL Intervention:    Grooming: Setup   Grooming Skilled Clinical Factors: seated    LE Dressing: Minimal assistance  LE Dressing Skilled Clinical Factors: to don/doff underwear;  CGA/min A for threading/safety; CGA to pull up over hips    Toileting: Contact guard assistance;Minimal assistance  Toileting Skilled Clinical Factors: SBA for seated hygiene; CGA/min A for clothing mgmt in standing      Instructed patient to increase time sitting OOB in chair for pulmonary hygiene, skin integrity, and to increase endurance in preparation for ADLs. Instructed patient to sit OOB for all meals. Patient verbalized understanding of same.     Pain Rating:  Pt reporting minimal pain   Pain Intervention(s):   repositioning    Activity Tolerance:   Fair  and tachy HR with activity  Please refer to the flowsheet for vital signs taken during this treatment.    After treatment:   Patient left in no apparent distress sitting up in chair, Call bell within reach, and Bed/ chair alarm activated    COMMUNICATION/EDUCATION:   The patient's plan of care was discussed with: physical therapist and registered nurse    Patient Education  Education Given To: Patient  Education Provided: Role of Therapy;Plan of Care;ADL Adaptive  Strategies;Equipment;Fall Pensions consultant Method: Verbal  Barriers to Learning: Cognition;Hearing  Education Outcome: Verbalized understanding;Continued education needed    Thank you for this referral.  Jolee Ewing, OT  Minutes: 23

## 2022-05-26 ENCOUNTER — Inpatient Hospital Stay: Admit: 2022-05-26 | Payer: MEDICARE | Primary: Internal Medicine

## 2022-05-26 LAB — CBC WITH AUTO DIFFERENTIAL
Absolute Immature Granulocyte: 0.2 10*3/uL — ABNORMAL HIGH (ref 0.00–0.04)
Basophils %: 0 % (ref 0–1)
Basophils Absolute: 0 10*3/uL (ref 0.0–0.1)
Eosinophils %: 0 % (ref 0–7)
Eosinophils Absolute: 0 10*3/uL (ref 0.0–0.4)
Hematocrit: 34.1 % — ABNORMAL LOW (ref 35.0–47.0)
Hemoglobin: 10.8 g/dL — ABNORMAL LOW (ref 11.5–16.0)
Immature Granulocytes: 1 % — ABNORMAL HIGH (ref 0.0–0.5)
Lymphocytes %: 11 % — ABNORMAL LOW (ref 12–49)
Lymphocytes Absolute: 1.7 10*3/uL (ref 0.8–3.5)
MCH: 29.7 PG (ref 26.0–34.0)
MCHC: 31.7 g/dL (ref 30.0–36.5)
MCV: 93.7 FL (ref 80.0–99.0)
MPV: 10.9 FL (ref 8.9–12.9)
Monocytes %: 7 % (ref 5–13)
Monocytes Absolute: 1.1 10*3/uL — ABNORMAL HIGH (ref 0.0–1.0)
Neutrophils %: 81 % — ABNORMAL HIGH (ref 32–75)
Neutrophils Absolute: 12.4 10*3/uL — ABNORMAL HIGH (ref 1.8–8.0)
Nucleated RBCs: 0 PER 100 WBC
Platelets: 705 10*3/uL — ABNORMAL HIGH (ref 150–400)
RBC: 3.64 M/uL — ABNORMAL LOW (ref 3.80–5.20)
RDW: 15.2 % — ABNORMAL HIGH (ref 11.5–14.5)
WBC: 15.4 10*3/uL — ABNORMAL HIGH (ref 3.6–11.0)
nRBC: 0 10*3/uL (ref 0.00–0.01)

## 2022-05-26 LAB — ECHO (TTE) COMPLETE (PRN CONTRAST/BUBBLE/STRAIN/3D)
AV Area by Peak Velocity: 3.2 cm2
AV Peak Gradient: 6 mmHg
AV Peak Velocity: 1.2 m/s
AV Velocity Ratio: 1
AVA/BSA Peak Velocity: 1.7 cm2/m2
Aortic Arch: 1.8 cm
Ascending Aorta Index: 1.78 cm/m2
Ascending Aorta: 3.4 cm
Body Surface Area: 1.94 m2
E/E' Lateral: 5.7
E/E' Ratio (Averaged): 6.92
E/E' Septal: 8.14
EF BP: 72 % (ref 55–100)
Fractional Shortening 2D: 36 % (ref 28–44)
IVSd: 0.8 cm (ref 0.6–0.9)
LA Diameter: 2.8 cm
LA Size Index: 1.47 cm/m2
LA Volume A-L A4C: 24 mL (ref 22–52)
LA Volume A-L A4C: 24 mL (ref 22–52)
LA Volume A-L A4C: 27 mL (ref 22–52)
LA Volume A-L A4C: 29 mL (ref 22–52)
LA Volume A/L: 30 mL
LA Volume BP: 26 mL (ref 22–52)
LA Volume Index A/L: 16 mL/m2 (ref 16–34)
LA Volume Index BP: 14 ml/m2 — AB (ref 16–34)
LV E' Lateral Velocity: 10 cm/s
LV E' Septal Velocity: 7 cm/s
LV EDV A2C: 44 mL
LV EDV A4C: 55 mL
LV EDV BP: 50 mL — AB (ref 56–104)
LV EDV Index A2C: 23 mL/m2
LV EDV Index A4C: 29 mL/m2
LV EDV Index BP: 26 mL/m2
LV ESV A2C: 14 mL
LV ESV A4C: 14 mL
LV ESV BP: 14 mL — AB (ref 19–49)
LV ESV Index A2C: 7 mL/m2
LV ESV Index A4C: 7 mL/m2
LV ESV Index BP: 7 mL/m2
LV Ejection Fraction A2C: 68 %
LV Ejection Fraction A4C: 75 %
LV Mass 2D Index: 53 g/m2 (ref 43–95)
LV Mass 2D: 101.3 g (ref 67–162)
LV RWT Ratio: 0.38
LVIDd Index: 2.2 cm/m2
LVIDd: 4.2 cm (ref 3.9–5.3)
LVIDs Index: 1.41 cm/m2
LVIDs: 2.7 cm
LVOT Area: 3.5 cm2
LVOT Diameter: 2.1 cm
LVOT Mean Gradient: 3 mmHg
LVOT Peak Gradient: 5 mmHg
LVOT Peak Velocity: 1.2 m/s
LVOT SV: 100.4 ml
LVOT Stroke Volume Index: 52.6 mL/m2
LVOT VTI: 29 cm
LVPWd: 0.8 cm (ref 0.6–0.9)
MR Peak Gradient: 96 mmHg
MR Peak Velocity: 4.9 m/s
MV A Velocity: 0.55 m/s
MV E Velocity: 0.57 m/s
MV E Wave Deceleration Time: 211.1 ms
MV E/A: 1.04
PV Max Velocity: 1 m/s
PV Peak Gradient: 4 mmHg
RV Free Wall Peak S': 12 cm/s
RVIDd: 4.1 cm
TAPSE: 1.3 cm — AB (ref 1.7–?)
TR Max Velocity: 2.14 m/s
TR Peak Gradient: 18 mmHg

## 2022-05-26 LAB — BASIC METABOLIC PANEL W/ REFLEX TO MG FOR LOW K
Anion Gap: 5 mmol/L (ref 5–15)
BUN: 12 MG/DL (ref 6–20)
Bun/Cre Ratio: 13 (ref 12–20)
CO2: 25 mmol/L (ref 21–32)
Calcium: 8 MG/DL — ABNORMAL LOW (ref 8.5–10.1)
Chloride: 114 mmol/L — ABNORMAL HIGH (ref 97–108)
Creatinine: 0.9 MG/DL (ref 0.55–1.02)
Est, Glom Filt Rate: >60 mL/min/{1.73_m2} (ref >60)
Glucose: 88 mg/dL (ref 65–100)
Potassium: 4.1 mmol/L (ref 3.5–5.1)
Sodium: 144 mmol/L (ref 136–145)

## 2022-05-26 LAB — CULTURE, MRSA, SCREENING

## 2022-05-26 LAB — POCT GLUCOSE
POC Glucose: 76 mg/dL (ref 65–117)
POC Glucose: 131 mg/dL — ABNORMAL HIGH (ref 65–117)

## 2022-05-26 LAB — LACOSAMIDE LEVEL: Lacosamide: 9.3 ug/mL (ref 5.0–10.0)

## 2022-05-26 LAB — INSULIN, SERUM: Insulin: 15.5 u[IU]/mL (ref 2.6–24.9)

## 2022-05-26 LAB — C-PEPTIDE: C-Peptide: 3.7 ng/mL (ref 1.1–4.4)

## 2022-05-26 LAB — CORTISOL AM, TOTAL: Cortisol - AM: >150 ug/dL — ABNORMAL HIGH (ref 4.30–22.45)

## 2022-05-26 MED ORDER — IOPAMIDOL 76 % IV SOLN
76 % | Freq: Once | INTRAVENOUS | Status: DC | PRN
Start: 2022-05-26 — End: 2022-06-16

## 2022-05-26 MED FILL — VALPROATE SODIUM 100 MG/ML IV SOLN: 100 MG/ML | INTRAVENOUS | Qty: 5

## 2022-05-26 MED FILL — ENOXAPARIN SODIUM 40 MG/0.4ML IJ SOSY: 40 MG/0.4ML | INTRAMUSCULAR | Qty: 0.4

## 2022-05-26 MED FILL — ISOVUE-370 76 % IV SOLN: 76 % | INTRAVENOUS | Qty: 100

## 2022-05-26 MED FILL — SOLU-CORTEF 100 MG IJ SOLR: 100 MG | INTRAMUSCULAR | Qty: 2

## 2022-05-26 MED FILL — CITALOPRAM HYDROBROMIDE 20 MG PO TABS: 20 MG | ORAL | Qty: 1

## 2022-05-26 MED FILL — MIDODRINE HCL 5 MG PO TABS: 5 MG | ORAL | Qty: 1

## 2022-05-26 MED FILL — LACOSAMIDE 200 MG/20ML IV SOLN: 200 MG/20ML | INTRAVENOUS | Qty: 20

## 2022-05-26 MED FILL — MEMANTINE HCL 10 MG PO TABS: 10 MG | ORAL | Qty: 1

## 2022-05-26 NOTE — Care Coordination-Inpatient (Signed)
Transition of Care Plan:    RUR: 18%    Plan for discharge:  The plan is for pt to return to the private home when discharged where she has care and supervision around the clock.  Caretaker @ home will transport pt when discharged.  Caretaker's name is Corky Downs and her number is [phone].    Weston Anna

## 2022-05-26 NOTE — Plan of Care (Signed)
Problem: Skin/Tissue Integrity  Goal: Absence of new skin breakdown  Description: 1.  Monitor for areas of redness and/or skin breakdown  2.  Assess vascular access sites hourly  3.  Every 4-6 hours minimum:  Change oxygen saturation probe site  4.  Every 4-6 hours:  If on nasal continuous positive airway pressure, respiratory therapy assess nares and determine need for appliance change or resting period.  Outcome: Progressing     Problem: Safety - Adult  Goal: Free from fall injury  Outcome: Progressing     Problem: Chronic Conditions and Co-morbidities  Goal: Patient's chronic conditions and co-morbidity symptoms are monitored and maintained or improved  Outcome: Progressing     Problem: Neurosensory - Adult  Goal: Achieves stable or improved neurological status  Outcome: Progressing     Problem: Respiratory - Adult  Goal: Achieves optimal ventilation and oxygenation  Outcome: Progressing     Problem: Cardiovascular - Adult  Goal: Maintains optimal cardiac output and hemodynamic stability  Outcome: Progressing     Problem: Skin/Tissue Integrity - Adult  Goal: Skin integrity remains intact  Outcome: Progressing

## 2022-05-26 NOTE — Progress Notes (Signed)
ICU Progress Note        Subjective: Overnight events noted.      Vital Signs:    BP 134/70   Pulse (!) 103   Temp 97.9 F (36.6 C) (Oral)   Resp 17   Ht 1.676 m (5' 5.98")   Wt 81.2 kg (179 lb)   SpO2 99%   BMI 28.91 kg/m             Temp (24hrs), Avg:98.3 F (36.8 C), Min:97.9 F (36.6 C), Max:98.9 F (37.2 C)       Intake/Output:   Last shift:      No intake/output data recorded.  Last 3 shifts: 10/11 1901 - 10/13 0700  In: 7497 [I.V.:3798.9]  Out: 300 [Urine:300]    Intake/Output Summary (Last 24 hours) at 05/26/2022 0109  Last data filed at 05/26/2022 0600  Gross per 24 hour   Intake 1062.97 ml   Output --   Net 1062.97 ml       Physical Exam:    General: Not in acute distress  HEENT:  Anicteric sclerae; pink palpebral conjunctivae; mucosa moist  Resp:  Bilateral air entry +, no crackles or wheeze  CV:  S1, S2 present  GI:  Abdomen soft, non-tender; (+) active bowel sounds  Extremities:  +2 pulses on all extremities; no edema/ cyanosis/ clubbing noted  Skin:  Warm; no rashes/ lesions noted  Neurologic:  Non-focal    DATA:     Current Facility-Administered Medications   Medication Dose Route Frequency    iopamidol (ISOVUE-370) 76 % injection 100 mL  100 mL IntraVENous ONCE PRN    midodrine (PROAMATINE) tablet 5 mg  5 mg Oral TID WC    0.9 % sodium chloride infusion   IntraVENous Continuous    enoxaparin (LOVENOX) injection 40 mg  40 mg SubCUTAneous Daily    glucose chewable tablet 16 g  4 tablet Oral PRN    dextrose bolus 10% 125 mL  125 mL IntraVENous PRN    Or    dextrose bolus 10% 250 mL  250 mL IntraVENous PRN    dextrose 10 % infusion   IntraVENous Continuous PRN    valproate (DEPACON) 500 mg in sodium chloride 0.9 % 50 mL infusion  500 mg IntraVENous BID    naloxone (NARCAN) injection 0.4 mg  0.4 mg IntraVENous PRN    hydrocortisone sodium succinate PF (SOLU-CORTEF) 50 mg in sterile water 2 mL injection  50 mg IntraVENous Q8H    norepinephrine (LEVOPHED) 16 mg in sodium chloride 0.9 % 250 mL  infusion  1-100 mcg/min IntraVENous Continuous    sodium chloride flush 0.9 % injection 5-40 mL  5-40 mL IntraVENous 2 times per day    sodium chloride flush 0.9 % injection 5-40 mL  5-40 mL IntraVENous PRN    0.9 % sodium chloride infusion   IntraVENous PRN    ondansetron (ZOFRAN-ODT) disintegrating tablet 4 mg  4 mg Oral Q8H PRN    Or    ondansetron (ZOFRAN) injection 4 mg  4 mg IntraVENous Q6H PRN    polyethylene glycol (GLYCOLAX) packet 17 g  17 g Oral Daily PRN    acetaminophen (TYLENOL) tablet 650 mg  650 mg Oral Q6H PRN    Or    acetaminophen (TYLENOL) suppository 650 mg  650 mg Rectal Q6H PRN    dextrose bolus 10% 125 mL  125 mL IntraVENous PRN    Or    dextrose bolus 10% 250 mL  250  mL IntraVENous PRN    glucagon injection 1 mg  1 mg SubCUTAneous PRN    dextrose 10 % infusion   IntraVENous Continuous PRN    [Held by provider] aspirin chewable tablet 81 mg  81 mg Oral Daily    citalopram (CELEXA) tablet 10 mg  10 mg Oral Daily    donepezil (ARICEPT) tablet 10 mg  10 mg Oral Nightly    memantine (NAMENDA) tablet 10 mg  10 mg Oral BID    rOPINIRole (REQUIP) tablet 1 mg  1 mg Oral Nightly    lacosamide (VIMPAT) injection 100 mg  100 mg IntraVENous BID         Recent Results (from the past 24 hour(s))   POCT Glucose    Collection Time: 05/25/22  9:58 AM   Result Value Ref Range    POC Glucose 124 (H) 65 - 117 mg/dL    Performed by: Allie Bossier    Echo (TTE) complete (PRN contrast/bubble/strain/3D)    Collection Time: 05/25/22 11:37 AM   Result Value Ref Range    IVSd 0.8 0.6 - 0.9 cm    LVIDd 4.2 3.9 - 5.3 cm    LVIDs 2.7 cm    LVOT Diameter 2.1 cm    LVPWd 0.8 0.6 - 0.9 cm    EF BP 72 55 - 100 %    LV Ejection Fraction A2C 68 %    LV Ejection Fraction A4C 75 %    LV EDV A2C 44 mL    LV EDV A4C 55 mL    LV EDV BP 50 (A) 56 - 104 mL    LV ESV A2C 14 mL    LV ESV A4C 14 mL    LV ESV BP 14 (A) 19 - 49 mL    LVOT Peak Gradient 5 mmHg    LVOT Mean Gradient 3 mmHg    LVOT SV 100.4 ml    LVOT Peak Velocity 1.2  m/s    LVOT VTI 29.0 cm    RVIDd 4.1 cm    RV Free Wall Peak S' 12 cm/s    LA Diameter 2.8 cm    LA Volume A/L 30 mL    LA Volume 2C 27 22 - 52 mL    LA Volume 4C 29 22 - 52 mL    LA Volume 2C 24 22 - 52 mL    LA Volume 4C 24 22 - 52 mL    LA Volume BP 26 22 - 52 mL    AV Area by Peak Velocity 3.2 cm2    AV Peak Gradient 6 mmHg    AV Peak Velocity 1.2 m/s    MV A Velocity 0.55 m/s    MV E Wave Deceleration Time 211.1 ms    MV E Velocity 0.57 m/s    LV E' Lateral Velocity 10 cm/s    LV E' Septal Velocity 7 cm/s    MR Peak Gradient 96 mmHg    MR Peak Velocity 4.9 m/s    PV Peak Gradient 4 mmHg    PV Max Velocity 1.0 m/s    TAPSE 1.3 (A) 1.7 cm    TR Peak Gradient 18 mmHg    TR Max Velocity 2.14 m/s    Aortic Arch 1.8 cm    Ascending Aorta 3.4 cm    Body Surface Area 1.94 m2    Fractional Shortening 2D 36 28 - 44 %    LV ESV Index BP 7 mL/m2  LV EDV Index BP 26 mL/m2    LV ESV Index A4C 7 mL/m2    LV EDV Index A4C 29 mL/m2    LV ESV Index A2C 7 mL/m2    LV EDV Index A2C 23 mL/m2    LVIDd Index 2.20 cm/m2    LVIDs Index 1.41 cm/m2    LV RWT Ratio 0.38     LV Mass 2D 101.3 67 - 162 g    LV Mass 2D Index 53.0 43 - 95 g/m2    MV E/A 1.04     E/E' Ratio (Averaged) 6.92     E/E' Lateral 5.70     E/E' Septal 8.14     LA Volume Index BP 14 (A) 16 - 34 ml/m2    LA Volume Index A/L 16 16 - 34 mL/m2    LVOT Stroke Volume Index 52.6 mL/m2    LVOT Area 3.5 cm2    LA Size Index 1.47 cm/m2    Ascending Aorta Index 1.78 cm/m2    AV Velocity Ratio 1.00     AVA/BSA Peak Velocity 1.7 cm2/m2   Lactic Acid    Collection Time: 05/25/22  2:28 PM   Result Value Ref Range    Lactic Acid, Plasma 3.2 (HH) 0.4 - 2.0 MMOL/L   POCT Glucose    Collection Time: 05/25/22 10:11 PM   Result Value Ref Range    POC Glucose 76 65 - 117 mg/dL    Performed by: Kathryne Gin    Basic Metabolic Panel w/ Reflex to MG    Collection Time: 05/26/22  3:14 AM   Result Value Ref Range    Sodium 144 136 - 145 mmol/L    Potassium 4.1 3.5 - 5.1 mmol/L    Chloride 114  (H) 97 - 108 mmol/L    CO2 25 21 - 32 mmol/L    Anion Gap 5 5 - 15 mmol/L    Glucose 88 65 - 100 mg/dL    BUN 12 6 - 20 MG/DL    Creatinine 7.03 5.00 - 1.02 MG/DL    Bun/Cre Ratio 13 12 - 20      Est, Glom Filt Rate >60 >60 ml/min/1.25m2    Calcium 8.0 (L) 8.5 - 10.1 MG/DL   CBC with Auto Differential    Collection Time: 05/26/22  3:14 AM   Result Value Ref Range    WBC 15.4 (H) 3.6 - 11.0 K/uL    RBC 3.64 (L) 3.80 - 5.20 M/uL    Hemoglobin 10.8 (L) 11.5 - 16.0 g/dL    Hematocrit 93.8 (L) 35.0 - 47.0 %    MCV 93.7 80.0 - 99.0 FL    MCH 29.7 26.0 - 34.0 PG    MCHC 31.7 30.0 - 36.5 g/dL    RDW 18.2 (H) 99.3 - 14.5 %    Platelets 705 (H) 150 - 400 K/uL    MPV 10.9 8.9 - 12.9 FL    Nucleated RBCs 0.0 0 PER 100 WBC    nRBC 0.00 0.00 - 0.01 K/uL    Neutrophils % 81 (H) 32 - 75 %    Lymphocytes % 11 (L) 12 - 49 %    Monocytes % 7 5 - 13 %    Eosinophils % 0 0 - 7 %    Basophils % 0 0 - 1 %    Immature Granulocytes 1 (H) 0.0 - 0.5 %    Neutrophils Absolute 12.4 (H) 1.8 - 8.0 K/UL    Lymphocytes  Absolute 1.7 0.8 - 3.5 K/UL    Monocytes Absolute 1.1 (H) 0.0 - 1.0 K/UL    Eosinophils Absolute 0.0 0.0 - 0.4 K/UL    Basophils Absolute 0.0 0.0 - 0.1 K/UL    Absolute Immature Granulocyte 0.2 (H) 0.00 - 0.04 K/UL    Differential Type SMEAR SCANNED      RBC Comment NORMOCYTIC, NORMOCHROMIC     Cortisol AM, Total    Collection Time: 05/26/22  3:14 AM   Result Value Ref Range    Cortisol - AM >150.0 (H) 4.30 - 22.45 ug/dL       Imaging:    XR CHEST PORTABLE   Final Result      No acute process.         CTA CHEST W WO CONTRAST   Final Result   1. No evidence of pulmonary embolism.   2. Cardiomegaly with coronary artery atherosclerosis.   3. Mild bibasilar atelectasis. No acute airspace disease.         MRI BRAIN WO CONTRAST   Final Result   1.  Mild chronic microvascular angiopathy.   2.  No evidence of hemorrhage, infarct, or mass.      CTA HEAD NECK W CONTRAST   Final Result   1. No acute vascular abnormality, no large vessel  occlusion. No hemodynamically   significant stenosis.   2. Mildly displaced right nasal bone fracture, with overlying soft tissue   swelling.   3. Multiple left-sided thyroid nodules, recommend dedicated thyroid sonography   for further evaluation..          XR CHEST 1 VIEW   Final Result   No acute cardiopulmonary disease.         CT HEAD WO CONTRAST   Final Result   No acute process or change compared to the prior exam.            CT CERVICAL SPINE WO CONTRAST   Final Result   No acute fracture or subluxation.         CT ABDOMEN W WO CONTRAST Additional Contrast? None    (Results Pending)       Assessment and Plan:    The patient is a 76/F with medical history as below who we are seeing in consultation at the request of Dr. Levell July for evaluation of hypotension.      Hypotension - unclear etiology. Sepsis vs cardiac vs adrenal. Off vasopressors. Systolic BP >140. Will d/c midodrine. Cultures negative to date. Cont. Antibiotics empirically. Normal EF on ECHO. Random cortisol normal.     No critical care need, can transfer to telemetry.     We will sign off.     Catha Brow, MD,MBA, FCCM, FCCP, ATSF, FACP, DAABIP  Interventional Pulmonology/Critical Care Medicine  [facility]  [facility]  [place]

## 2022-05-26 NOTE — Progress Notes (Signed)
Hospitalist Progress Note      NAME:  Meghan [name]   DOB:  [birth year]  MRM:  [id number]    Date/Time: 05/26/2022  5:47 PM           Assessment / Plan:     Encephalopathy/ Hx of stroke of cerebrum/ Hx of seizure POA:   Shock  Hypoglycemia  ??adrenal insufficiency v sepsis v seizure  - improving drastically with steroids  - cta neg, eeg no seiz  - adding fluids to assist coming off pressors  - stop abx as looking more like adrenal crisis and no infection noted.  - acth and cortisol labs a wash bc started on steroids before they were drawn  - need to slowly wean steroids in prep for dc. Check ct abdomen to look for signs hemorrhage, infection, adrenal mass.     Leukocytosis - -from steroids    AKI POA: resolved now that bp stable.     Hypoglycemia/  Hx Type 2 diabetes mellitus with diabetic neuropathy (HCC) POA:   - off glucose containing gtt. Now with stable glucoses after steroids initiation.  - sugar checks q6     Thrombocytosis POA: Stroke work-up as above.  Hold home aspirin for now due to nose bleed     Nosebleed POA: Due to fall.  Check facial CT.  Afrin spray as needed     Fall POA: Caretaker states they note no bruises.  CT head and neck unremarkable as above.  PT OT once able to participate.     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  Checking Vimpat level. Neuro advancing depakote therapy, eeg no seiz but not normal either     Hyperlipidemia: Continue fenofibrate once can tolerate orals and med rec confirmed.     Dementia without behavioral disturbance (HCC): Resume home Aricept, Namenda once confirmed.     Parkinson's disease: Resume home  rasagline when able to tolerate PO      Depression: Continue Celexa.     Risk of deterioration: high       Total time spent with patient: 36 Minutes                                                          Care Plan discussed with: Patient and Nursing Staff     Discussed:  Care Plan      Prophylaxis:  SCD's     Probable Disposition: transfer out of unit. Pt/ot to see    Attending Physician: Kaylyn Lim, DO        Subjective:     Chief Complaint:  syncope    Encountered bedside  She is confused, baseline  No cp nvd  Updated as to plan      ROS:  Denies cp sob nvd          Objective:       Vitals:          Last 24hrs VS reviewed since prior progress note. Most recent are:    Vitals:    05/26/22 1616   BP: (!) 163/94   Pulse: 79   Resp: 18   Temp: 98.1 F (36.7 C)   SpO2: 93%     SpO2 Readings from Last 6 Encounters:  05/26/22 93%   03/25/22 94%   03/25/22 99%          Intake/Output Summary (Last 24 hours) at 05/26/2022 1747  Last data filed at 05/26/2022 1400  Gross per 24 hour   Intake 1741.67 ml   Output --   Net 1741.67 ml          Exam:     Physical Exam:    Gen:  Well-developed, well-nourished, in no acute distress; awake alert without issue  HEENT:  Pink conjunctivae,  hearing intact to voice, moist mucous membranes  Neck:  Supple, without masses, thyroid non-tender  Resp:  No accessory muscle use, clear breath sounds without wheezes rales or rhonchi  Card:  No murmurs, normal S1, S2 without thrills, bruits or peripheral edema  Abd:  Soft, non-tender, non-distended, normoactive bowel sounds are present  Musc:  No cyanosis or clubbing  Skin:  No rashes or ulcers, skin turgor is good  Neuro:  Cranial nerves 3-12 are grossly intact, grip strength is 5/5 bilaterally and dorsi / plantarflexion is 5/5 bilaterally, follows commands appropriately; moves all extrems to command good strength  Psych:  alert, not very well oriented but hard of hearing so difficult to tell       Telemetry reviewed:   sinus rhytm with pvcs    Medications Reviewed: (see below)    Lab Data Reviewed: (see below)    ______________________________________________________________________    Medications:     Current Facility-Administered Medications   Medication Dose Route Frequency    iopamidol (ISOVUE-370) 76 % injection 100  mL  100 mL IntraVENous ONCE PRN    midodrine (PROAMATINE) tablet 5 mg  5 mg Oral TID WC    0.9 % sodium chloride infusion   IntraVENous Continuous    enoxaparin (LOVENOX) injection 40 mg  40 mg SubCUTAneous Daily    glucose chewable tablet 16 g  4 tablet Oral PRN    dextrose bolus 10% 125 mL  125 mL IntraVENous PRN    Or    dextrose bolus 10% 250 mL  250 mL IntraVENous PRN    dextrose 10 % infusion   IntraVENous Continuous PRN    valproate (DEPACON) 500 mg in sodium chloride 0.9 % 50 mL infusion  500 mg IntraVENous BID    naloxone (NARCAN) injection 0.4 mg  0.4 mg IntraVENous PRN    hydrocortisone sodium succinate PF (SOLU-CORTEF) 50 mg in sterile water 2 mL injection  50 mg IntraVENous Q8H    norepinephrine (LEVOPHED) 16 mg in sodium chloride 0.9 % 250 mL infusion  1-100 mcg/min IntraVENous Continuous    sodium chloride flush 0.9 % injection 5-40 mL  5-40 mL IntraVENous 2 times per day    sodium chloride flush 0.9 % injection 5-40 mL  5-40 mL IntraVENous PRN    0.9 % sodium chloride infusion   IntraVENous PRN    ondansetron (ZOFRAN-ODT) disintegrating tablet 4 mg  4 mg Oral Q8H PRN    Or    ondansetron (ZOFRAN) injection 4 mg  4 mg IntraVENous Q6H PRN    polyethylene glycol (GLYCOLAX) packet 17 g  17 g Oral Daily PRN    acetaminophen (TYLENOL) tablet 650 mg  650 mg Oral Q6H PRN    Or    acetaminophen (TYLENOL) suppository 650 mg  650 mg Rectal Q6H PRN    dextrose bolus 10% 125 mL  125 mL IntraVENous PRN    Or    dextrose bolus 10% 250 mL  250 mL  IntraVENous PRN    glucagon injection 1 mg  1 mg SubCUTAneous PRN    dextrose 10 % infusion   IntraVENous Continuous PRN    [Held by provider] aspirin chewable tablet 81 mg  81 mg Oral Daily    citalopram (CELEXA) tablet 10 mg  10 mg Oral Daily    donepezil (ARICEPT) tablet 10 mg  10 mg Oral Nightly    memantine (NAMENDA) tablet 10 mg  10 mg Oral BID    rOPINIRole (REQUIP) tablet 1 mg  1 mg Oral Nightly    lacosamide (VIMPAT) injection 100 mg  100 mg IntraVENous BID             Lab Review:     Recent Labs     05/24/22  1755 05/25/22  0147 05/26/22  0314   WBC 7.9 8.3 15.4*   HGB 12.5 10.3* 10.8*   HCT 39.5 32.9* 34.1*   PLT 867* 740* 705*     Recent Labs     05/24/22  2317 05/25/22  0147 05/26/22  0314   NA 143 145 144   K 3.8 4.2 4.1   CL 113* 117* 114*   CO2 23 23 25    BUN 10 10 12    MG 1.4*  --   --    PHOS 1.4*  --   --    ALT 13  --   --      No components found for: "GLPOC"

## 2022-05-26 NOTE — Progress Notes (Signed)
TRANSFER - OUT REPORT:    Verbal report given to Zada, RN on Meghan [name]  being transferred to 34 for routine progression of patient care       Report consisted of patient's Situation, Background, Assessment and   Recommendations(SBAR).     Information from the following report(s) Nurse Handoff Report, ED Encounter Summary, Intake/Output, MAR, Recent Results, and Cardiac Rhythm NSR  was reviewed with the receiving nurse.    Lines:   Peripheral IV 05/24/22 Distal;Left;Anterior Wrist (Active)   Site Assessment Clean, dry & intact 05/26/22 1200   Line Status Infusing 05/26/22 1200   Line Care Connections checked and tightened 05/26/22 1200   Phlebitis Assessment No symptoms 05/26/22 1200   Infiltration Assessment 0 05/26/22 1200   Alcohol Cap Used Yes 05/26/22 1200   Dressing Status Clean, dry & intact 05/26/22 1200   Dressing Type Transparent 05/26/22 1200       Peripheral IV 05/26/22 Left;Proximal Forearm (Active)   Site Assessment Clean, dry & intact 05/26/22 1200   Line Status Blood return noted;Flushed;Capped 05/26/22 1200   Line Care Cap changed 05/26/22 1200   Phlebitis Assessment No symptoms 05/26/22 1200   Infiltration Assessment 0 05/26/22 1200   Alcohol Cap Used Yes 05/26/22 1200   Dressing Status Clean, dry & intact 05/26/22 1200   Dressing Type Transparent 05/26/22 1200   Dressing Intervention New 05/26/22 1200        Opportunity for questions and clarification was provided.      Patient transported with:  Monitor, Registered Nurse, and [employer]

## 2022-05-26 NOTE — Consults (Signed)
Is a 76 year old woman admitted to [facility] May 23, 2022 for AMS and hypotension.and hypoglycenua .We are asked to assist in possible diagnosis of adrenal insufficiency. This note is based entirely on review of the  medical record..    On admission she had a sodium of 140 with a normal potassium, creat 1.36, glucose 132, lactate 2.8-3.7. Her glucose fluctuated modestly in association with dextrose administration. Because of her hypotension she was started on Hydrocortisone 50 mg IV  received her first dose at 1758 on 05/24/2022. A blood sample for cortisol was obtained at [place] on 05/24/2022 which returned 46.9. She has continued to receive HC 50 mg Q 8 hr.  Recent cortisol levels are > 150 consistent with HC dosing. ACTH levels have also been drawn. The patient appears to have responded to the steroids per the notes.    Impression  Possible adrenal insufficiency but with no biochemical evidence for this. There is no history of past steroid administration  that I am able to see.  If the initial random cortisol was drawn prior to the first dose of hydrocortisone then adrenal insufficiency  is ruled out. If the value was obtained after the initial dose of hydrocortisone then there is no biochemical way to tell if steroid replacement is needed. The subsequent elevations are totally consistent with the  [facility] dosing. I would expect the ACTH levels to be low or low normal after treatment.  Plan  Would recommend taper of hydrocortisone according to clinical status.  Once off hydrocortisone can perform ACTH stimulation

## 2022-05-26 NOTE — Progress Notes (Signed)
Attempted to work with the patient for Physical Therapy, chart reviewed and discussed with nurse cleared for therapy. Patient up on the recliner when received offered to ambulate with therapy patient refuse does not want to get up and ambulate. Explained benefits of therapy however patient continue to declined. We will continue to follow up with the patient.

## 2022-05-27 ENCOUNTER — Inpatient Hospital Stay: Admit: 2022-05-27 | Payer: MEDICARE | Primary: Internal Medicine

## 2022-05-27 LAB — POCT GLUCOSE
POC Glucose: 162 mg/dL — ABNORMAL HIGH (ref 65–117)
POC Glucose: 92 mg/dL (ref 65–117)
POC Glucose: 109 mg/dL (ref 65–117)
POC Glucose: 130 mg/dL — ABNORMAL HIGH (ref 65–117)

## 2022-05-27 LAB — ACTH: Acth: 3 pg/mL — ABNORMAL LOW (ref 7.2–63.3)

## 2022-05-27 MED ORDER — STERILE WATER FOR INJECTION (MIXTURES ONLY)
100 MG | Freq: Two times a day (BID) | INTRAMUSCULAR | Status: DC
Start: 2022-05-27 — End: 2022-05-28
  Administered 2022-05-28: 50 mg via INTRAVENOUS

## 2022-05-27 MED FILL — VALPROATE SODIUM 100 MG/ML IV SOLN: 100 MG/ML | INTRAVENOUS | Qty: 5

## 2022-05-27 MED FILL — ENOXAPARIN SODIUM 40 MG/0.4ML IJ SOSY: 40 MG/0.4ML | INTRAMUSCULAR | Qty: 0.4

## 2022-05-27 MED FILL — LACOSAMIDE 200 MG/20ML IV SOLN: 200 MG/20ML | INTRAVENOUS | Qty: 20

## 2022-05-27 MED FILL — MEMANTINE HCL 10 MG PO TABS: 10 MG | ORAL | Qty: 1

## 2022-05-27 MED FILL — SOLU-CORTEF 100 MG IJ SOLR: 100 MG | INTRAMUSCULAR | Qty: 2

## 2022-05-27 MED FILL — CITALOPRAM HYDROBROMIDE 20 MG PO TABS: 20 MG | ORAL | Qty: 1

## 2022-05-27 MED FILL — DONEPEZIL HCL 5 MG PO TABS: 5 MG | ORAL | Qty: 2

## 2022-05-27 MED FILL — ROPINIROLE HCL 0.25 MG PO TABS: 0.25 MG | ORAL | Qty: 4

## 2022-05-27 NOTE — Progress Notes (Signed)
[facility]  [address] [place], [place] [address]  [phone]        Hospitalist Progress Note      NAME: Meghan [name]   DOB:  10/18/1945  MRM:  [id number]    Date/Time of service: 05/27/2022  1:56 PM       Subjective:     Chief Complaint:  Patient was personally seen and examined by me during this time period.  Chart reviewed. F/up shock and hypoglycemia d/t adrenal insufficiency.    Feeling well today without complaints.       Objective:       Vitals:       Last 24hrs VS reviewed since prior progress note. Most recent are:    Vitals:    05/27/22 1123   BP: (!) 178/90   Pulse: 86   Resp: 18   Temp: 97.9 F (36.6 C)   SpO2: 97%     SpO2 Readings from Last 6 Encounters:   05/27/22 97%   03/25/22 94%   03/25/22 99%          Intake/Output Summary (Last 24 hours) at 05/27/2022 1356  Last data filed at 05/27/2022 0313  Gross per 24 hour   Intake 1541 ml   Output --   Net 1541 ml        Exam:     Physical Exam:    Gen:  Well-developed, well-nourished, in no acute distress  HEENT:  Pink conjunctivae, EOMI, hearing intact to voice, moist mucous membranes  Neck:  Supple, without masses, thyroid non-tender  Resp:  No accessory muscle use, clear breath sounds without wheezes rales or rhonchi  Card:  No murmurs, normal S1, S2 without thrills, bruits or peripheral edema  Abd:  Soft, non-tender, non-distended, normoactive bowel sounds are present, no palpable organomegaly and no detectable hernias  Musc:  No cyanosis or clubbing  Skin:  No rashes or ulcers, skin turgor is good  Neuro:  follows commands appropriately  Psych:  poor insight. Oriented to self and place. alert      Medications Reviewed: (see below)    Lab Data Reviewed: (see below)    ______________________________________________________________________    Medications:     Current Facility-Administered Medications   Medication Dose Route Frequency    iopamidol (ISOVUE-370) 76 % injection 100 mL  100 mL IntraVENous ONCE PRN     enoxaparin (LOVENOX) injection 40 mg  40 mg SubCUTAneous Daily    glucose chewable tablet 16 g  4 tablet Oral PRN    dextrose bolus 10% 125 mL  125 mL IntraVENous PRN    Or    dextrose bolus 10% 250 mL  250 mL IntraVENous PRN    dextrose 10 % infusion   IntraVENous Continuous PRN    valproate (DEPACON) 500 mg in sodium chloride 0.9 % 50 mL infusion  500 mg IntraVENous BID    naloxone (NARCAN) injection 0.4 mg  0.4 mg IntraVENous PRN    hydrocortisone sodium succinate PF (SOLU-CORTEF) 50 mg in sterile water 2 mL injection  50 mg IntraVENous Q8H    sodium chloride flush 0.9 % injection 5-40 mL  5-40 mL IntraVENous 2 times per day    sodium chloride flush 0.9 % injection 5-40 mL  5-40 mL IntraVENous PRN    0.9 % sodium chloride infusion   IntraVENous PRN    ondansetron (ZOFRAN-ODT) disintegrating tablet 4 mg  4 mg Oral Q8H PRN    Or  ondansetron (ZOFRAN) injection 4 mg  4 mg IntraVENous Q6H PRN    polyethylene glycol (GLYCOLAX) packet 17 g  17 g Oral Daily PRN    acetaminophen (TYLENOL) tablet 650 mg  650 mg Oral Q6H PRN    Or    acetaminophen (TYLENOL) suppository 650 mg  650 mg Rectal Q6H PRN    dextrose bolus 10% 125 mL  125 mL IntraVENous PRN    Or    dextrose bolus 10% 250 mL  250 mL IntraVENous PRN    glucagon injection 1 mg  1 mg SubCUTAneous PRN    dextrose 10 % infusion   IntraVENous Continuous PRN    [Held by provider] aspirin chewable tablet 81 mg  81 mg Oral Daily    citalopram (CELEXA) tablet 10 mg  10 mg Oral Daily    donepezil (ARICEPT) tablet 10 mg  10 mg Oral Nightly    memantine (NAMENDA) tablet 10 mg  10 mg Oral BID    rOPINIRole (REQUIP) tablet 1 mg  1 mg Oral Nightly    lacosamide (VIMPAT) injection 100 mg  100 mg IntraVENous BID          Lab Review:     Recent Labs     05/24/22  1755 05/25/22  0147 05/26/22  0314   WBC 7.9 8.3 15.4*   HGB 12.5 10.3* 10.8*   HCT 39.5 32.9* 34.1*   PLT 867* 740* 705*     Recent Labs     05/24/22  2317 05/25/22  0147 05/26/22  0314   NA 143 145 144   K 3.8 4.2  4.1   CL 113* 117* 114*   CO2 23 23 25    BUN 10 10 12    MG 1.4*  --   --    PHOS 1.4*  --   --    ALT 13  --   --      No results found for: "GLUCPOC"       Assessment / Plan:     Encephalopathy/ Hx of stroke of cerebrum/ Hx of seizure: POA. She seems to be back to baseline now that she's not in shock. MRI brain NAP. EEG without seizure.  - Monitor    Shock 2/2 adrenal insufficiency: POA. Improved with steroids. Off pressor. Off fluids. Off Abx. ACTH and cortisol labs a wash bc started on steroids before they were drawn. TSH low-ish but likely not main etiology.  - CT abd to look for adrenal mass pending  - Wean solucortef to BID    Thyroid nodules: POA. Incidental on CTA neck.  - Thyroid    Lactic acidosis: POA. Likely d/t the above. Likely resolved now but not able to check as patient declining labs  - Repeat LA when able to ensure resolution     Leukocytosis: From steroids. No s/s infection. Procal low.     AKI: POA. Acute. Resolved now that bp stable.     Hypoglycemia /  Hx Type 2 diabetes mellitus with diabetic neuropathy (HCC) POA: Off glucose containing gtt. Now with stable glucoses after steroids initiation.  - Glucose checks     Thrombocytosis: POA. Likely reactive. Stroke work-up neg.  Hold home aspirin for now due to nose bleed     Nosebleed: Acute. POA. Due to fall.  Resolved. CT head NAP.  - Afrin spray as needed     Fall: POA. Acute. D/t the above. Caretaker states they note no bruises.  CT head and neck unremarkable  as above.  - PT/OT     History of complex partial seizure evolving to generalized seizure: POA. Seizure ruled out with EEG.  - Cont vimpat  - neuro added valproate  - Appreciate neuro recs     Hyperlipidemia: Continue fenofibrate once can tolerate orals and med rec confirmed.     Dementia without behavioral disturbance: POA. Chronic. Back to baseline now.  - Resume home Namenda     Parkinson's disease: POA. Chronic.  - Resume home meds when confirmed     Depression: POA. Chronic.  -  Continue Celexa.    **Prior records, notes, labs, radiology, and medications reviewed in [place] discussed with: Patient and Nursing Staff    Discussed:  Care Plan    Prophylaxis:  Lovenox    Disposition:   TBD           ___________________________________________________    Attending Physician: Andrena Mews, MD

## 2022-05-27 NOTE — Plan of Care (Signed)
Problem: Skin/Tissue Integrity  Goal: Absence of new skin breakdown  Description: 1.  Monitor for areas of redness and/or skin breakdown  2.  Assess vascular access sites hourly  3.  Every 4-6 hours minimum:  Change oxygen saturation probe site  4.  Every 4-6 hours:  If on nasal continuous positive airway pressure, respiratory therapy assess nares and determine need for appliance change or resting period.  Outcome: Progressing     Problem: Safety - Adult  Goal: Free from fall injury  Outcome: Progressing     Problem: Chronic Conditions and Co-morbidities  Goal: Patient's chronic conditions and co-morbidity symptoms are monitored and maintained or improved  Outcome: Progressing     Problem: Neurosensory - Adult  Goal: Achieves stable or improved neurological status  Outcome: Progressing     Problem: Respiratory - Adult  Goal: Achieves optimal ventilation and oxygenation  Outcome: Progressing     Problem: Skin/Tissue Integrity - Adult  Goal: Skin integrity remains intact  Outcome: Progressing     Problem: Musculoskeletal - Adult  Goal: Return mobility to safest level of function  Outcome: Progressing     Problem: Genitourinary - Adult  Goal: Absence of urinary retention  Outcome: Progressing     Problem: Infection - Adult  Goal: Absence of infection at discharge  Outcome: Progressing     Problem: Metabolic/Fluid and Electrolytes - Adult  Goal: Electrolytes maintained within normal limits  Outcome: Progressing

## 2022-05-28 ENCOUNTER — Inpatient Hospital Stay: Admit: 2022-05-29 | Payer: MEDICARE | Primary: Internal Medicine

## 2022-05-28 LAB — POCT GLUCOSE
POC Glucose: 143 mg/dL — ABNORMAL HIGH (ref 65–117)
POC Glucose: 90 mg/dL (ref 65–117)
POC Glucose: 70 mg/dL (ref 65–117)
POC Glucose: 107 mg/dL (ref 65–117)

## 2022-05-28 LAB — LACTIC ACID: Lactic Acid, Plasma: 2.6 MMOL/L (ref 0.4–2.0)

## 2022-05-28 MED ORDER — SODIUM CHLORIDE 0.9 % IV BOLUS
0.9 % | Freq: Once | INTRAVENOUS | Status: AC
Start: 2022-05-28 — End: 2022-05-28
  Administered 2022-05-28: 21:00:00 500 mL via INTRAVENOUS

## 2022-05-28 MED ORDER — HYDRALAZINE HCL 20 MG/ML IJ SOLN
20 MG/ML | Freq: Four times a day (QID) | INTRAMUSCULAR | Status: AC | PRN
Start: 2022-05-28 — End: 2022-06-16
  Administered 2022-05-29 – 2022-06-02 (×5): 10 mg via INTRAVENOUS

## 2022-05-28 MED ORDER — COSYNTROPIN 0.25 MG IJ SOLR
0.25 MG | Freq: Once | INTRAMUSCULAR | Status: DC
Start: 2022-05-28 — End: 2022-05-29

## 2022-05-28 MED FILL — VALPROATE SODIUM 100 MG/ML IV SOLN: 100 MG/ML | INTRAVENOUS | Qty: 5

## 2022-05-28 MED FILL — SOLU-CORTEF 100 MG IJ SOLR: 100 MG | INTRAMUSCULAR | Qty: 2

## 2022-05-28 MED FILL — ROPINIROLE HCL 0.25 MG PO TABS: 0.25 MG | ORAL | Qty: 4

## 2022-05-28 MED FILL — MEMANTINE HCL 10 MG PO TABS: 10 MG | ORAL | Qty: 1

## 2022-05-28 MED FILL — CITALOPRAM HYDROBROMIDE 20 MG PO TABS: 20 MG | ORAL | Qty: 1

## 2022-05-28 MED FILL — LACOSAMIDE 200 MG/20ML IV SOLN: 200 MG/20ML | INTRAVENOUS | Qty: 20

## 2022-05-28 MED FILL — ENOXAPARIN SODIUM 40 MG/0.4ML IJ SOSY: 40 MG/0.4ML | INTRAMUSCULAR | Qty: 0.4

## 2022-05-28 MED FILL — STERILE WATER FOR INJECTION IJ SOLN: INTRAMUSCULAR | Qty: 10

## 2022-05-28 MED FILL — SODIUM CHLORIDE 0.9 % IV SOLN: 0.9 % | INTRAVENOUS | Qty: 500

## 2022-05-28 MED FILL — HYDRALAZINE HCL 20 MG/ML IJ SOLN: 20 MG/ML | INTRAMUSCULAR | Qty: 1

## 2022-05-28 MED FILL — DONEPEZIL HCL 5 MG PO TABS: 5 MG | ORAL | Qty: 2

## 2022-05-28 MED FILL — CORTROSYN 0.25 MG IJ SOLR: 0.25 MG | INTRAMUSCULAR | Qty: 250

## 2022-05-28 NOTE — Plan of Care (Signed)
Problem: Skin/Tissue Integrity  Goal: Absence of new skin breakdown  Description: 1.  Monitor for areas of redness and/or skin breakdown  2.  Assess vascular access sites hourly  3.  Every 4-6 hours minimum:  Change oxygen saturation probe site  4.  Every 4-6 hours:  If on nasal continuous positive airway pressure, respiratory therapy assess nares and determine need for appliance change or resting period.  05/29/2022 0223 by Guilianna Mckoy, Zachery Dakins, RN  Outcome: Progressing  05/29/2022 0222 by Jaquelyn Sakamoto, Zachery Dakins, RN  Outcome: Progressing  05/29/2022 0220 by Icis Budreau, Zachery Dakins, RN  Outcome: Progressing     Problem: Safety - Adult  Goal: Free from fall injury  05/29/2022 0223 by Emmit Oriley, Zachery Dakins, RN  Outcome: Progressing  05/29/2022 0222 by Katrece Roediger, Zachery Dakins, RN  Outcome: Progressing  05/29/2022 0220 by Kerry-Anne Mezo, Zachery Dakins, RN  Outcome: Progressing     Problem: Chronic Conditions and Co-morbidities  Goal: Patient's chronic conditions and co-morbidity symptoms are monitored and maintained or improved  Outcome: Progressing     Problem: Discharge Planning  Goal: Discharge to home or other facility with appropriate resources  05/29/2022 0223 by Somnang Mahan, Zachery Dakins, RN  Outcome: Progressing  05/29/2022 0222 by Cheron Pasquarelli, Zachery Dakins, RN  Outcome: Progressing  05/29/2022 0220 by Claud Gowan, Zachery Dakins, RN  Outcome: Progressing     Problem: Neurosensory - Adult  Goal: Achieves stable or improved neurological status  05/29/2022 0223 by Colette Dicamillo, Zachery Dakins, RN  Outcome: Progressing  05/29/2022 0222 by Kaci Freel, Zachery Dakins, RN  Outcome: Progressing  05/29/2022 0220 by Janette Harvie, Zachery Dakins, RN  Outcome: Progressing     Problem: Respiratory - Adult  Goal: Achieves optimal ventilation and oxygenation  05/29/2022 0223 by Rylinn Linzy, Zachery Dakins, RN  Outcome: Progressing  05/29/2022 0222 by Kimi Kroft, Zachery Dakins, RN  Outcome: Progressing  05/29/2022 0220 by Milbert Bixler, Zachery Dakins, RN  Outcome: Progressing     Problem: Cardiovascular - Adult  Goal: Maintains optimal cardiac  output and hemodynamic stability  05/29/2022 0223 by Willett Lefeber, Zachery Dakins, RN  Outcome: Progressing  05/29/2022 0222 by Nethra Mehlberg, Zachery Dakins, RN  Outcome: Progressing  05/29/2022 0220 by Mischelle Reeg, Zachery Dakins, RN  Outcome: Progressing     Problem: Skin/Tissue Integrity - Adult  Goal: Skin integrity remains intact  05/29/2022 0223 by Conchita Truxillo, Zachery Dakins, RN  Outcome: Progressing  05/29/2022 0222 by Vincent Streater, Zachery Dakins, RN  Outcome: Progressing     Problem: Musculoskeletal - Adult  Goal: Return mobility to safest level of function  05/29/2022 0223 by Alexes Lamarque, Zachery Dakins, RN  Outcome: Progressing  05/29/2022 0222 by Avaley Coop, Zachery Dakins, RN  Outcome: Progressing  05/29/2022 0220 by Tory Septer, Zachery Dakins, RN  Outcome: Progressing     Problem: Genitourinary - Adult  Goal: Absence of urinary retention  05/29/2022 0223 by Garrell Flagg, Zachery Dakins, RN  Outcome: Progressing  05/29/2022 0222 by Amore Ackman, Zachery Dakins, RN  Outcome: Progressing     Problem: Infection - Adult  Goal: Absence of infection at discharge  05/29/2022 0223 by Earland Reish, Zachery Dakins, RN  Outcome: Progressing  05/29/2022 0223 by Elissia Spiewak, Zachery Dakins, RN  Outcome: Progressing  05/29/2022 0222 by Hiromi Knodel, Zachery Dakins, RN  Outcome: Progressing     Problem: Metabolic/Fluid and Electrolytes - Adult  Goal: Electrolytes maintained within normal limits  05/29/2022 0223 by Osric Klopf, Zachery Dakins, RN  Outcome: Progressing  05/29/2022 0222 by Tira Lafferty, Zachery Dakins, RN  Outcome: Progressing     Problem: Pain  Goal: Verbalizes/displays adequate comfort level or baseline comfort  level  Outcome: Progressing

## 2022-05-28 NOTE — Progress Notes (Addendum)
POST FALL MANAGEMENT    [place] RECORD NUMBER:  [id number]  AGE: 76 y.o.   GENDER: female  DOB: Jun 03, 1946  TODAYS DATE:  05/29/2022    Details     Fall Occurred: Yes    Was the Fall Witnessed:  Yes -Assisted fall    Brief Review of Event: Both tech and her orientee were in the bathroom with patient when she passed out assist her to the floor.        Who found the patient: Caryl Pina and Roselyn Reef with patient in the bathroom.      Where was the patient at the time of the fall: Bathroom      Patient Comments: passed out       Date Fall Occurred:  June 28, 2022 .       Time Fall Occurred: 7:59p.m.     Assessment     Post Fall Head to Toe Assessment Completed: Yes    Post Fall Predictive Analytic Score Reviewed: Yes     Post Fall Vitals Completed: Yes    Post Fall Neuro Checks Completed: Yes    Injury Occurred(if yes, describe injury):  yes - Right ankle fracture and dislocation.           Did the Patient Experience:(Check [place] all that apply)    []  Patient hit head  []  Loss of consciousness  []  Change in mental status following the fall  [x]  Patient is on an anticoagulant medication      CT Performed:  yes    Follow-up     Persons Notified of Fall:  (Provide names of persons notified)   [x]  Physician:   []  APP:  [x]  Nursing Supervisior:  [x]  Manager:  []  Pharmacist:  [x]  Family:  [x]  Other:      Electronically signed by Henderson Cypress, RN 05/29/2022 at 6:26 AM

## 2022-05-28 NOTE — Progress Notes (Signed)
[facility]  [address], [place] [address]  [phone]        Hospitalist Progress Note      NAME: Meghan [name]   DOB:  [birth year]  MRM:  [id number]    Date/Time of service: 05/28/2022  12:54 PM       Subjective:     Chief Complaint:  Patient was personally seen and examined by me during this time period.  Chart reviewed. F/up shock and hypoglycemia d/t adrenal insufficiency.    Feeling well today without complaints. Looking forward to discharge.       Objective:       Vitals:       Last 24hrs VS reviewed since prior progress note. Most recent are:    Vitals:    05/28/22 1045   BP: (!) 158/83   Pulse: 74   Resp: 17   Temp: 98.4 F (36.9 C)   SpO2: 100%     SpO2 Readings from Last 6 Encounters:   05/28/22 100%   03/25/22 94%   03/25/22 99%          Intake/Output Summary (Last 24 hours) at 05/28/2022 1254  Last data filed at 05/28/2022 0319  Gross per 24 hour   Intake 3169 ml   Output --   Net 3169 ml          Exam:     Physical Exam:    Gen:  Well-developed, well-nourished, in no acute distress  HEENT:  Pink conjunctivae, EOMI, hearing intact to voice, moist mucous membranes  Neck:  Supple, without masses, thyroid non-tender  Resp:  No accessory muscle use, clear breath sounds without wheezes rales or rhonchi  Card:  No murmurs, normal S1, S2 without thrills, bruits or peripheral edema  Abd:  Soft, non-tender, non-distended, normoactive bowel sounds are present, no palpable organomegaly and no detectable hernias  Musc:  No cyanosis or clubbing  Skin:  No rashes or ulcers, skin turgor is good  Neuro:  follows commands appropriately  Psych:  poor insight. Oriented to self and place. alert      Medications Reviewed: (see below)    Lab Data Reviewed: (see below)    ______________________________________________________________________    Medications:     Current Facility-Administered Medications   Medication Dose Route Frequency    hydrALAZINE (APRESOLINE) injection 10  mg  10 mg IntraVENous Q6H PRN    iopamidol (ISOVUE-370) 76 % injection 100 mL  100 mL IntraVENous ONCE PRN    enoxaparin (LOVENOX) injection 40 mg  40 mg SubCUTAneous Daily    glucose chewable tablet 16 g  4 tablet Oral PRN    dextrose bolus 10% 125 mL  125 mL IntraVENous PRN    Or    dextrose bolus 10% 250 mL  250 mL IntraVENous PRN    dextrose 10 % infusion   IntraVENous Continuous PRN    valproate (DEPACON) 500 mg in sodium chloride 0.9 % 50 mL infusion  500 mg IntraVENous BID    naloxone (NARCAN) injection 0.4 mg  0.4 mg IntraVENous PRN    sodium chloride flush 0.9 % injection 5-40 mL  5-40 mL IntraVENous 2 times per day    sodium chloride flush 0.9 % injection 5-40 mL  5-40 mL IntraVENous PRN    0.9 % sodium chloride infusion   IntraVENous PRN    ondansetron (ZOFRAN-ODT) disintegrating tablet 4 mg  4 mg Oral Q8H PRN    Or  ondansetron (ZOFRAN) injection 4 mg  4 mg IntraVENous Q6H PRN    polyethylene glycol (GLYCOLAX) packet 17 g  17 g Oral Daily PRN    acetaminophen (TYLENOL) tablet 650 mg  650 mg Oral Q6H PRN    Or    acetaminophen (TYLENOL) suppository 650 mg  650 mg Rectal Q6H PRN    dextrose bolus 10% 125 mL  125 mL IntraVENous PRN    Or    dextrose bolus 10% 250 mL  250 mL IntraVENous PRN    glucagon injection 1 mg  1 mg SubCUTAneous PRN    dextrose 10 % infusion   IntraVENous Continuous PRN    [Held by provider] aspirin chewable tablet 81 mg  81 mg Oral Daily    citalopram (CELEXA) tablet 10 mg  10 mg Oral Daily    donepezil (ARICEPT) tablet 10 mg  10 mg Oral Nightly    memantine (NAMENDA) tablet 10 mg  10 mg Oral BID    rOPINIRole (REQUIP) tablet 1 mg  1 mg Oral Nightly    lacosamide (VIMPAT) injection 100 mg  100 mg IntraVENous BID          Lab Review:     Recent Labs     05/26/22  0314   WBC 15.4*   HGB 10.8*   HCT 34.1*   PLT 705*       Recent Labs     05/26/22  0314   NA 144   K 4.1   CL 114*   CO2 25   BUN 12       No results found for: "GLUCPOC"       Assessment / Plan:     Encephalopathy/ Hx of  stroke of cerebrum/ Hx of seizure: POA. She seems to be back to baseline now that she's not in shock. MRI brain NAP. EEG without seizure.  - Monitor    Shock 2/2 adrenal insufficiency: POA. Improved with steroids. Off pressor. Off fluids. Off Abx. ACTH and cortisol labs a wash bc started on steroids before they were drawn. TSH low-ish. US thyroid ordered to eval incidentally seen thyroid nodules found left thyroid lobe enlargement with multiple nodules, the largest 3.1 cm. I dont think thyroid is main etiology.  - Stop steroids today  - ACTH stim test tomorrow am  - Appreciate endo recs    Thyroid nodules: POA. Incidental on CTA neck. US thyroid ordered to eval incidentally seen thyroid nodules found left thyroid lobe enlargement with multiple nodules, the largest 3.1 cm. I dont think thyroid is main etiology.  - per endo  - Outpt follow up    Lactic acidosis: POA. Likely d/t the above. Likely resolved now but not able to check as patient declining labs  - Repeat LA when able to ensure resolution     Leukocytosis: From steroids. No s/s infection. Procal low.     AKI: POA. Acute. Resolved now that bp stable.     Hypoglycemia /  Hx Type 2 diabetes mellitus with diabetic neuropathy (HCC) POA: Off glucose containing gtt. Now with stable glucoses after steroids initiation.  - Glucose checks     Thrombocytosis: POA. Likely reactive. Stroke work-up neg.  Hold home aspirin for now due to nose bleed     Nosebleed: Acute. POA. Due to fall.  Resolved. CT head NAP.  - Afrin spray as needed     Fall: POA. Acute. D/t the above. Caretaker states they note no bruises.  CT head and neck  unremarkable as above.  - PT/OT     History of complex partial seizure evolving to generalized seizure: POA. Seizure ruled out with EEG.  - Cont vimpat  - neuro added valproate  - Appreciate neuro recs     Hyperlipidemia: Continue fenofibrate once can tolerate orals and med rec confirmed.     Dementia without behavioral disturbance: POA. Chronic.  Back to baseline now.  - Resume home Namenda     Parkinson's disease: POA. Chronic.  - Resume home meds when confirmed     Depression: POA. Chronic.  - Continue Celexa.    **Prior records, notes, labs, radiology, and medications reviewed in [facility]**                 Care Plan discussed with: Patient and Nursing Staff    Discussed:  Care Plan    Prophylaxis:  Lovenox    Disposition:   TBD           ___________________________________________________    Attending Physician: Renelda Loma, MD
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
## 2022-05-28 NOTE — Progress Notes (Addendum)
Lactic acid order placed by attending. Lab called with a critical of 2.6 lactic acid. Attending notified and ordered 500 mL bolus. No additional orders at this time.    1858: Reached out to provider to inquire about further orders, waiting for clarification on new orders.

## 2022-05-28 NOTE — Progress Notes (Signed)
Responded to code fall called overhead.  Techs at bedside stated patient almost passed out and they helped her onto her seat.  They deny that patient experienced any trauma.  Patient denies any chest pain or shortness of breath.  She does endorse pain of the ankle and there appears to be abnormality of the ankle; check chest x-ray.  In regards to presyncope, will check CT head, EKG, troponin and monitor orthostatics we will also start IVF.  Recent normal echo noted.

## 2022-05-29 ENCOUNTER — Inpatient Hospital Stay: Admit: 2022-05-29 | Payer: MEDICARE | Primary: Internal Medicine

## 2022-05-29 DIAGNOSIS — H40053 Ocular hypertension, bilateral: Secondary | ICD-10-CM | POA: Diagnosis not present

## 2022-05-29 LAB — EKG 12-LEAD
Atrial Rate: 80 {beats}/min
Diagnosis: NORMAL
P Axis: 28 degrees
P-R Interval: 138 ms
Q-T Interval: 366 ms
QRS Duration: 66 ms
QTc Calculation (Bazett): 422 ms
R Axis: 12 degrees
T Axis: 46 degrees
Ventricular Rate: 80 {beats}/min

## 2022-05-29 LAB — CULTURE, BLOOD 1: Culture: NO GROWTH

## 2022-05-29 LAB — LACTIC ACID
Lactic Acid, Plasma: 3.5 MMOL/L (ref 0.4–2.0)
Lactic Acid, Plasma: 1.8 MMOL/L (ref 0.4–2.0)

## 2022-05-29 LAB — PROINSULIN: Proinsulin: 17.5 pmol/L — ABNORMAL HIGH (ref 0.0–10.0)

## 2022-05-29 LAB — TROPONIN
Troponin, High Sensitivity: 90 ng/L — ABNORMAL HIGH (ref 0–51)
Troponin, High Sensitivity: 32 ng/L (ref 0–51)

## 2022-05-29 LAB — CULTURE, BLOOD 2: Culture: NO GROWTH

## 2022-05-29 LAB — POCT GLUCOSE
POC Glucose: 157 mg/dL — ABNORMAL HIGH (ref 65–117)
POC Glucose: 86 mg/dL (ref 65–117)
POC Glucose: 93 mg/dL (ref 65–117)

## 2022-05-29 MED ORDER — HYDROMORPHONE 0.5MG/0.5ML IJ SOLN
1 MG/ML | Freq: Once | Status: AC
Start: 2022-05-29 — End: 2022-05-29
  Administered 2022-05-29: 05:00:00 0.5 mg via INTRAVENOUS

## 2022-05-29 MED ORDER — LACOSAMIDE 100 MG PO TABS
100 MG | Freq: Two times a day (BID) | ORAL | Status: AC
Start: 2022-05-29 — End: 2022-06-16
  Administered 2022-05-30 – 2022-06-16 (×36): 100 mg via ORAL

## 2022-05-29 MED ORDER — FENTANYL CITRATE (PF) 100 MCG/2ML IJ SOLN
100 MCG/2ML | INTRAMUSCULAR | Status: AC
Start: 2022-05-29 — End: ?

## 2022-05-29 MED ORDER — SODIUM CHLORIDE 0.9 % IV SOLN
0.9 % | INTRAVENOUS | Status: DC
Start: 2022-05-29 — End: 2022-05-29
  Administered 2022-05-29: 01:00:00 via INTRAVENOUS

## 2022-05-29 MED ORDER — SODIUM CHLORIDE 0.9 % IV BOLUS
0.9 % | Freq: Once | INTRAVENOUS | Status: AC
Start: 2022-05-29 — End: 2022-05-29
  Administered 2022-05-29: 05:00:00 500 mL via INTRAVENOUS

## 2022-05-29 MED ORDER — SUGAMMADEX SODIUM 200 MG/2ML IV SOLN
200 MG/2ML | INTRAVENOUS | Status: DC | PRN
Start: 2022-05-29 — End: 2022-05-29
  Administered 2022-05-29: 23:00:00 200 via INTRAVENOUS

## 2022-05-29 MED ORDER — FENTANYL CITRATE (PF) 100 MCG/2ML IJ SOLN
100 MCG/2ML | INTRAMUSCULAR | Status: DC | PRN
Start: 2022-05-29 — End: 2022-05-29
  Administered 2022-05-29 (×2): 50 via INTRAVENOUS

## 2022-05-29 MED ORDER — ROCURONIUM BROMIDE 50 MG/5ML IV SOLN
50 MG/5ML | INTRAVENOUS | Status: DC | PRN
Start: 2022-05-29 — End: 2022-05-29
  Administered 2022-05-29: 22:00:00 30 via INTRAVENOUS

## 2022-05-29 MED ORDER — DEXMEDETOMIDINE HCL 200 MCG/2ML IV SOLN
200 MCG/2ML | INTRAVENOUS | Status: DC | PRN
Start: 2022-05-29 — End: 2022-05-29
  Administered 2022-05-29: 23:00:00 4 via INTRAVENOUS

## 2022-05-29 MED ORDER — CEFAZOLIN SODIUM 1 G IJ SOLR
1 g | INTRAMUSCULAR | Status: DC | PRN
Start: 2022-05-29 — End: 2022-05-29
  Administered 2022-05-29: 23:00:00 2 via INTRAVENOUS

## 2022-05-29 MED ORDER — LACOSAMIDE 200 MG/20ML IV SOLN
200 MG/20ML | Freq: Once | INTRAVENOUS | Status: AC
Start: 2022-05-29 — End: 2022-05-29
  Administered 2022-05-29: 16:00:00 100 mg via INTRAVENOUS

## 2022-05-29 MED ORDER — MIDAZOLAM HCL 2 MG/2ML IJ SOLN
2 MG/ML | INTRAMUSCULAR | Status: AC
Start: 2022-05-29 — End: ?

## 2022-05-29 MED ORDER — BUPIVACAINE HCL (PF) 0.5 % IJ SOLN
0.5 % | Freq: Once | INTRAMUSCULAR | Status: DC
Start: 2022-05-29 — End: 2022-06-16

## 2022-05-29 MED ORDER — PHENYLEPHRINE HCL (PRESSORS) 0.4 MG/10ML IV SOSY
0.4 MG/10ML | INTRAVENOUS | Status: DC | PRN
Start: 2022-05-29 — End: 2022-05-29
  Administered 2022-05-29 (×2): 80 via INTRAVENOUS

## 2022-05-29 MED ORDER — PROPOFOL 200 MG/20ML IV EMUL
200 MG/20ML | INTRAVENOUS | Status: DC | PRN
Start: 2022-05-29 — End: 2022-05-29
  Administered 2022-05-29 (×2): 50 via INTRAVENOUS
  Administered 2022-05-29: 22:00:00 150 via INTRAVENOUS

## 2022-05-29 MED ORDER — HYDROMORPHONE 0.5MG/0.5ML IJ SOLN
1 MG/ML | Freq: Once | Status: AC
Start: 2022-05-29 — End: 2022-05-28
  Administered 2022-05-29: 04:00:00 0.5 mg via INTRAVENOUS

## 2022-05-29 MED ORDER — LACOSAMIDE 100 MG PO TABS
100 MG | Freq: Two times a day (BID) | ORAL | Status: DC
Start: 2022-05-29 — End: 2022-05-29

## 2022-05-29 MED ORDER — ONDANSETRON HCL 4 MG/2ML IJ SOLN
4 MG/2ML | INTRAMUSCULAR | Status: DC | PRN
Start: 2022-05-29 — End: 2022-05-29
  Administered 2022-05-29: 23:00:00 4 via INTRAVENOUS

## 2022-05-29 MED ORDER — MIDAZOLAM HCL 2 MG/2ML IJ SOLN
2 MG/ML | INTRAMUSCULAR | Status: DC | PRN
Start: 2022-05-29 — End: 2022-05-29
  Administered 2022-05-29: 22:00:00 2 via INTRAVENOUS

## 2022-05-29 MED ORDER — LACTATED RINGERS IV SOLN
INTRAVENOUS | Status: DC | PRN
Start: 2022-05-29 — End: 2022-05-29
  Administered 2022-05-29: 22:00:00 via INTRAVENOUS

## 2022-05-29 MED FILL — ACETAMINOPHEN 325 MG PO TABS: 325 MG | ORAL | Qty: 2

## 2022-05-29 MED FILL — MIDAZOLAM HCL 2 MG/2ML IJ SOLN: 2 MG/ML | INTRAMUSCULAR | Qty: 2

## 2022-05-29 MED FILL — HYDROMORPHONE HCL 1 MG/ML IJ SOLN: 1 MG/ML | INTRAMUSCULAR | Qty: 0.5

## 2022-05-29 MED FILL — MEMANTINE HCL 10 MG PO TABS: 10 MG | ORAL | Qty: 1

## 2022-05-29 MED FILL — HYDRALAZINE HCL 20 MG/ML IJ SOLN: 20 MG/ML | INTRAMUSCULAR | Qty: 1

## 2022-05-29 MED FILL — LACOSAMIDE 200 MG/20ML IV SOLN: 200 MG/20ML | INTRAVENOUS | Qty: 20

## 2022-05-29 MED FILL — SODIUM CHLORIDE 0.9 % IV SOLN: 0.9 % | INTRAVENOUS | Qty: 1000

## 2022-05-29 MED FILL — VALPROATE SODIUM 100 MG/ML IV SOLN: 100 MG/ML | INTRAVENOUS | Qty: 5

## 2022-05-29 MED FILL — ROPINIROLE HCL 0.25 MG PO TABS: 0.25 MG | ORAL | Qty: 4

## 2022-05-29 MED FILL — BUPIVACAINE HCL (PF) 0.5 % IJ SOLN: 0.5 % | INTRAMUSCULAR | Qty: 30

## 2022-05-29 MED FILL — DONEPEZIL HCL 5 MG PO TABS: 5 MG | ORAL | Qty: 2

## 2022-05-29 MED FILL — FENTANYL CITRATE (PF) 100 MCG/2ML IJ SOLN: 100 MCG/2ML | INTRAMUSCULAR | Qty: 2

## 2022-05-29 MED FILL — LACOSAMIDE 100 MG PO TABS: 100 MG | ORAL | Qty: 1

## 2022-05-29 NOTE — Progress Notes (Addendum)
[facility]  [address] [place], [place] [address]  [phone]        Hospitalist Progress Note      NAME: Meghan [name]   DOB:  03-02-1946  MRM:  [id number]    Date/Time of service: 05/29/2022  9:30 AM       Subjective:     Chief Complaint:  Patient was personally seen and examined by me during this time period.  Chart reviewed. F/up shock and hypoglycemia d/t adrenal insufficiency.    24h events notable for witnessed presyncope and fall with ankle fracture. Per patients report of events it doesn't sound like presyncope but more mechanical/weakness. She states it happened when she was turning around/twisting in the bathroom. Attempted reduction was unsuccessful so ortho planning OR today.    Having some ankle pain this am but otherwise states she feels fine and has no complaints.       Objective:       Vitals:       Last 24hrs VS reviewed since prior progress note. Most recent are:    Vitals:    05/29/22 0835   BP: (!) 161/94   Pulse: 83   Resp: 16   Temp: 98.4 F (36.9 C)   SpO2: 98%     SpO2 Readings from Last 6 Encounters:   05/29/22 98%   03/25/22 94%   03/25/22 99%          Intake/Output Summary (Last 24 hours) at 05/29/2022 0930  Last data filed at 05/29/2022 0604  Gross per 24 hour   Intake 1325 ml   Output --   Net 1325 ml          Exam:     Physical Exam:    Gen:  Well-developed, well-nourished, in no acute distress  HEENT:  Pink conjunctivae, EOMI, hearing intact to voice, moist mucous membranes  Neck:  Supple, without masses, thyroid non-tender  Resp:  No accessory muscle use, clear breath sounds without wheezes rales or rhonchi  Card:  No murmurs, normal S1, S2 without thrills, bruits or peripheral edema  Abd:  Soft, non-tender, non-distended, normoactive bowel sounds are present, no palpable organomegaly and no detectable hernias  Musc:  Right ankle wrapped and immobilized. Sensation or motor of toes intact  Skin:  No rashes or ulcers, skin turgor is  good  Neuro:  follows commands appropriately  Psych:  poor insight. Oriented to self and place. alert      Medications Reviewed: (see below)    Lab Data Reviewed: (see below)    ______________________________________________________________________    Medications:     Current Facility-Administered Medications   Medication Dose Route Frequency    lacosamide (VIMPAT) tablet 100 mg  100 mg Oral BID    hydrALAZINE (APRESOLINE) injection 10 mg  10 mg IntraVENous Q6H PRN    0.9 % sodium chloride infusion   IntraVENous Continuous    bupivacaine (PF) (MARCAINE) 0.5 % injection 150 mg  30 mL IntraDERmal Once    iopamidol (ISOVUE-370) 76 % injection 100 mL  100 mL IntraVENous ONCE PRN    [Held by provider] enoxaparin (LOVENOX) injection 40 mg  40 mg SubCUTAneous Daily    glucose chewable tablet 16 g  4 tablet Oral PRN    dextrose bolus 10% 125 mL  125 mL IntraVENous PRN    Or    dextrose bolus 10% 250 mL  250 mL IntraVENous PRN    dextrose 10 % infusion  IntraVENous Continuous PRN    valproate (DEPACON) 500 mg in sodium chloride 0.9 % 50 mL infusion  500 mg IntraVENous BID    naloxone (NARCAN) injection 0.4 mg  0.4 mg IntraVENous PRN    sodium chloride flush 0.9 % injection 5-40 mL  5-40 mL IntraVENous 2 times per day    sodium chloride flush 0.9 % injection 5-40 mL  5-40 mL IntraVENous PRN    0.9 % sodium chloride infusion   IntraVENous PRN    ondansetron (ZOFRAN-ODT) disintegrating tablet 4 mg  4 mg Oral Q8H PRN    Or    ondansetron (ZOFRAN) injection 4 mg  4 mg IntraVENous Q6H PRN    polyethylene glycol (GLYCOLAX) packet 17 g  17 g Oral Daily PRN    acetaminophen (TYLENOL) tablet 650 mg  650 mg Oral Q6H PRN    Or    acetaminophen (TYLENOL) suppository 650 mg  650 mg Rectal Q6H PRN    dextrose bolus 10% 125 mL  125 mL IntraVENous PRN    Or    dextrose bolus 10% 250 mL  250 mL IntraVENous PRN    glucagon injection 1 mg  1 mg SubCUTAneous PRN    dextrose 10 % infusion   IntraVENous Continuous PRN    [Held by provider]  aspirin chewable tablet 81 mg  81 mg Oral Daily    citalopram (CELEXA) tablet 10 mg  10 mg Oral Daily    donepezil (ARICEPT) tablet 10 mg  10 mg Oral Nightly    memantine (NAMENDA) tablet 10 mg  10 mg Oral BID    rOPINIRole (REQUIP) tablet 1 mg  1 mg Oral Nightly          Lab Review:     No results for input(s): "WBC", "HGB", "HCT", "PLT" in the last 72 hours.    No results for input(s): "NA", "K", "CL", "CO2", "GLU", "BUN", "CREA", "CA", "MG", "PHOS", "ALB", "ALT", "INR" in the last 72 hours.    Invalid input(s): "TBIL", "TBILI", "SGOT"    No results found for: "GLUCPOC"       Assessment / Plan:     Witnessed GLF with right ankle fracture: Not POA. Witnessed. ?presyncope but I dont think so based on patients report. I think this was more mechanical/weakness related.Very mild trop elevation was flat. Ct head neg.  - Fall precautions  - Orthostatics  - Ortho to reduce ankle fracture today in OR  - NPO    Encephalopathy/ Hx of stroke of cerebrum/ Hx of seizure: POA. She seems to be back to baseline now that she's not in shock. MRI brain NAP. EEG without seizure. CT head ok. Very mild trop elevation was flat.  - Monitor    Shock 2/2 adrenal insufficiency: POA. Improved with steroids. Off pressor. Off fluids. Off Abx. Off steroids. ACTH and cortisol labs a wash bc started on steroids before they were drawn. TSH low-ish. US thyroid ordered to eval incidentally seen thyroid nodules found left thyroid lobe enlargement with multiple nodules, the largest 3.1 cm. I dont think thyroid is main etiology.  - Was planning ACTH stim test this am but patient had a fall with ankle fracture so I took out the order  - Appreciate endo recs    Thyroid nodules: POA. Incidental on CTA neck. US thyroid ordered to eval incidentally seen thyroid nodules found left thyroid lobe enlargement with multiple nodules, the largest 3.1 cm. I dont think thyroid is main etiology.  - per endo  -  Outpt follow up    Lactic acidosis: POA. Likely d/t the  above. Likely resolved now but not able to check as patient declining labs  - Repeat LA when able to ensure resolution     Leukocytosis: From steroids. No s/s infection. Procal low.     AKI: POA. Acute. Resolved now that bp stable.     Hypoglycemia /  Hx Type 2 diabetes mellitus with diabetic neuropathy (HCC) POA: Off glucose containing gtt. Now with stable glucoses after steroids initiation.  - Glucose checks     Thrombocytosis: POA. Likely reactive. Stroke work-up neg.  Hold home aspirin for now due to nose bleed     Nosebleed: Acute. POA. Due to fall.  Resolved. CT head NAP.  - Afrin spray as needed     Fall: POA. Acute. D/t the above. Caretaker states they note no bruises.  CT head and neck unremarkable as above.  - PT/OT     History of complex partial seizure evolving to generalized seizure: POA. Seizure ruled out with EEG.  - Cont vimpat  - neuro added valproate  - Appreciate neuro recs     Hyperlipidemia: Continue fenofibrate once can tolerate orals and med rec confirmed.     Dementia without behavioral disturbance: POA. Chronic. Back to baseline now.  - Resume home Namenda     Parkinson's disease: POA. Chronic.  - Resume home meds when confirmed     Depression: POA. Chronic.  - Continue Celexa.    **Prior records, notes, labs, radiology, and medications reviewed in [facility]**                 Care Plan discussed with: Patient and Nursing Staff    Discussed:  Care Plan    Prophylaxis:  Lovenox    Disposition:   TBD           ___________________________________________________    Attending Physician: Renelda Loma, MD

## 2022-05-29 NOTE — Progress Notes (Signed)
I tried to reach the patients caretaker via phone. No answer and no voicemail. Patients sons number not on file. Will try again later.    Andrena Mews, MD
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
## 2022-05-29 NOTE — Op Note (Unsigned)
[place] [facility]  OPERATIVE REPORT    Name:  Meghan [name], Meghan [name]  MR#:  [id number]  DOB:  November 14, 1945  ACCOUNT #:  [id number]  DATE OF SERVICE:  05/29/2022    PREOPERATIVE DIAGNOSIS:  Closed right trimalleolar ankle fracture.    POSTOPERATIVE DIAGNOSIS:  Closed right trimalleolar ankle fracture.    PROCEDURE PERFORMED:  1.  Closed reduction and external fixation, right trimalleolar ankle fracture.  2.  Independent interpretation of intraoperative fluoroscopy.    SURGEON:  Sydell Axon. Cliffton Asters, MD    ASSISTANT:  Georgiann Cocker    ANESTHESIA:  General.    COMPLICATIONS:  None.    SPECIMENS REMOVED:  None.    IMPLANTS:  Stryker large external fixator.    ESTIMATED BLOOD LOSS:  Less than 5 mL.    DRAINS:  None.    FINDINGS:  Closed right trimalleolar ankle fracture with small posterior medial fracture blister, approximately 3 cm in diameter.    TOURNIQUET TIME:  Thigh tourniquet 300 mmHg for 25 minutes.    INDICATIONS:  This is a 76 year old female who suffered a ground-level fall in the hospital last night.  She was closed reduced and splinted and postreduction x-ray showed posterior subluxation of the talus on the tibia with displacement of the posterior malleolus fracture.  This is an unstable injury and is putting undue pressure on the ankle cartilage.  I offered both the conservative and surgical management options to the patient.  I recommended that she be treated with closed reduction and external fixation to provide stability to the fracture and also provide a better near anatomic reduction and to take some of the pressure off of her skin and allow for swelling to improve.  I do not believe that leaving her in the splint in a mild reduction would give her the best chance for a good outcome because of the malreduction putting pressure on her ankle cartilage.  I discussed the risks of surgery with the patient which include but not limited to complications of anesthesia including death, pain,  bleeding, infection, damage to surrounding structures, DVT, PE, wound healing problems, fracture and the need for further surgery.  The patient verbalized understanding and elected to proceed with surgical intervention.    DESCRIPTION OF PROCEDURE:  The correct patient, extremity and operation were identified in preop holding area and marked her right ankle.  She was taken back to the operating room and placed under general anesthesia on the operating room table and all bony prominences were padded well and she was secured to table with safety strap.  The right lower extremity was prepped and draped in usual sterile fashion and a preop time-out was called.  Again, the correct patient, extremity and operation were identified, and all parties were in and we proceeded with the operation.  Prior to prepping and draping, a nonsterile well-padded upper thigh tourniquet was placed on the right side.  The right lower extremity was then exsanguinated with an Esmarch bandage and the tourniquet was raised.    Two small stab incisions were made on the anterior aspect of the mid shaft of the tibia and using the hand guide, the two half pins were placed bicortically and found to be in satisfactory position, with AP and lateral imaging, in the tibia.  The calcaneus pin was placed from medial to lateral in the calcaneal tuberosity and fluoroscopic images showed satisfactory placement of the calcaneal pin.  The pin-to-bar clamps and bar-to-bar clamps were then placed on the  external fixator and the external fixator was built.  Longitudinal traction was placed on the ankle and AP and mortise imaging showed satisfactory closed reduction of the ankle fracture.  Lateral x-rays showed that there were still some posterior subluxation of the talus on the tibia with the ankle left in plantarflexion.  When I plantar flexed the ankle, this corrected this problem and we placed the posterior malleolus fracture in a near-anatomic reduced  position and the talus well reduced under the tibia.    A smaller half pin was placed obliquely in the first metatarsal and confirmed to be in satisfactory position with fluoroscopic images.  Placement of this pin was done to ensure protection of the EHL tendon and the EHL was not tethered by this pin.  The pin was then attached to the external fixator and the ankle was placed in plantarflexion and held in position and fluoroscopic images showed satisfactory reduction of the fracture.    Final fluoroscopic images of the right ankle were taken, reviewed and independently interpreted by me intraoperatively, which showed satisfactory reduction of the ankle mortise and satisfactory position of the fracture fragments.    Xeroform dressing was placed at the pin sites and an Ace wrap placed over the external fixator.  After the dressings were in place, the tourniquet was dropped and the patient was awakened from anesthesia and taken to recovery room in hemodynamically stable condition.  All lap and sharp counts were correct at the end of case.    POSTOPERATIVE CARE:  The patient will be admitted back to the hospitalist service.  Should be nonweightbearing to the left lower extremity.  She will start Lovenox 40 mg subcu daily for DVT prophylaxis on the morning of postop day #1 and we will plan tentatively to remove the external fixator and perform ORIF of her right ankle fracture on Thursday this week if her skin swelling allows.  If her swelling gets worse and she develops more fracture blisters, we will need to wait at least until next week.  We will follow her in the hospital.        Paulino Rily, MD      [id number]  D:  05/29/2022 19:16  T:  05/29/2022 22:02  JOB #:  [id number]

## 2022-05-29 NOTE — Progress Notes (Addendum)
Patient had a fall and broke her ankle overnight. External reduction unsuccessful. Will need to go to OR this afternoon. NPO. Hold anticoag. Ortho team to get consent from [facility]. Cancel ACTH stim test. Will get cardiology to look at her for cardiac clearance since this was a presyncope event, though I think etiology was not cardiac.    Andrena Mews, MD

## 2022-05-29 NOTE — Progress Notes (Signed)
Ortho Note    Subjective:    Meghan [name] is a 76 y.o. female w/ closed RIGHT trimalleolar ankle fracture after fall on the floor yesterday    Major Events:.    Pain controlled    Objective:    Vital signs in last 24 hours:    @VSRANGES @    Temp (24hrs), Avg:98.8 F (37.1 C), Min:97.5 F (36.4 C), Max:100 F (37.8 C)      Labs:    Lab Results   Component Value Date/Time    WBC 15.4 05/26/2022 03:14 AM    HGB 10.8 05/26/2022 03:14 AM    HCT 34.1 05/26/2022 03:14 AM    PLT 705 05/26/2022 03:14 AM       Lab Results   Component Value Date/Time    NA 144 05/26/2022 03:14 AM    K 4.1 05/26/2022 03:14 AM    CL 114 05/26/2022 03:14 AM    CO2 25 05/26/2022 03:14 AM    BUN 12 05/26/2022 03:14 AM    CREATININE 0.90 05/26/2022 03:14 AM    GLUCOSE 88 05/26/2022 03:14 AM       Physical Exam:    General: alert, cooperative, in NAD  Nonlabored resp    RIGHT Lower extremity    Dressing: splint c/d/i      Motor: + toe df/pf    Sensory: SILT    Vascular: Brisk capillary refill in toes    Assessment/Plan:  76 yo F with closed RIGHT trimal ankle fx that is unstable and mal-reduced in splint with posterior subluxation of the talus on the tibia.    I offered both conservative and surgical management options to her.  I recommended closed reduction and external fixation of her ankle fx to improve alignment, possibly prevent further cartilage damage and provide stability.    NPO  Bedrest  Pain control  Plan OR tonight for closed reduction and ex-fix RIGHT ankle      Loraine Maple, MD

## 2022-05-29 NOTE — Progress Notes (Incomplete)
Rapid Called at 0014    Responded to RRT at 0014 for CODE [place] > 12K/mcL,Lactic > 2    Provider at bedside: NO  Interventions ordered: EKG and Other (Comment) bolus.   Sepsis Suspected: Yes  Transfer to Higher Level of Care: NA  Blood Glucose: ***     Pt lactate came back as critical number, MD was notified. Per RRT protocol 527ml bolus is ordered.     Vitals:    05/29/22 0017   BP: (!) 153/75   Pulse: 88   Resp: 18   Temp: 98.4 F (36.9 C)   SpO2: 97%        Rapid Ended at 0038  RRT RN assisted with transport to accepting unit NA.    Bunnie Pion, RN

## 2022-05-29 NOTE — Progress Notes (Signed)
Spiritual Care Assessment/Progress Note  [place]    Name: ALEATHIA PURDY MRN: [id number]    Age: 76 y.o.     Sex: female   Language: English     Date: 05/29/2022            Total Time Calculated: 15 min              Spiritual Assessment begun in [facility] 5M1 MED SURG 1  Service Provided For:: Patient not available  Referral/Consult From:: Rounding  Encounter Overview/Reason : Attempted Encounter    Spiritual beliefs:      []  Involved in a faith tradition/spiritual practice:      []  Supported by a faith community:      []  Claims no spiritual orientation:      []  Seeking spiritual identity:           []  Adheres to an individual form of spirituality:      [x]  Not able to assess:                Identified resources for coping and support system:   Support System: Unknown       []  Prayer                  []  Devotional reading               []  Music                  []  Guided Imagery     []  Pet visits                                        []  Other: (COMMENT)     Specific area/focus of visit   Encounter:    Crisis:    Spiritual/Emotional needs:    Ritual, Rites and Sacraments:    Grief, Loss, and Adjustments:    Ethics/Mediation:    Behavioral Health:    Palliative Care:    Advance Care Planning:           Narrative:   Visited Ms Shiffer in room 502 for initial spiritual assessment. Reviewed patient's chart prior to visit. Ms Pinson appeared to be sleeping and no family was present; unable to do the assessment at that time. Left note assuring patient of chaplain availability for support.     Sampson Si. Ric Rosenberg, MDiv, [facility]  To page chaplain: [phone])

## 2022-05-29 NOTE — Progress Notes (Signed)
Pt fall protocol  Yellow arm band on patient, yellow non skid socks on   bed in low position, all side rails up, call bell in reach  Pt  & family instructed in "call don't fall" protocol     Use your call bell,and wait for assistance, staff not family will assist you to get up &   move about  Pt & family verbalize understanding of fall precautions and "call don't fall" protocol

## 2022-05-29 NOTE — Anesthesia Post-Procedure Evaluation (Signed)
Department of Anesthesiology  Postprocedure Note    Patient: Meghan [name]  MRN: [id number]  Birthdate: 10-12-1945  Date of evaluation: 05/29/2022      Procedure Summary     Date: 05/29/22 Room / Location: [facility] OR F1 / [facility] OR    Anesthesia Start: 1821 Anesthesia Stop: 1930    Procedure: ANKLE EXTERNAL FIXATOR APPLICATION RIGHT (Right: Ankle) Diagnosis:       Closed fracture of right ankle, initial encounter      (Closed fracture of right ankle, initial encounter [G86.761P])    Surgeons: Beola Cord., MD Responsible Provider: Cristy Folks, MD    Anesthesia Type: General ASA Status: 3          Anesthesia Type: General    Aldrete Phase I: Aldrete Score: 9    Aldrete Phase II:        Anesthesia Post Evaluation

## 2022-05-29 NOTE — Brief Op Note (Signed)
Brief Postoperative Note      Patient: Meghan [name]  Date of Birth: May 01, 1946  MRN: [id number]    Date of Procedure: 05/29/2022    Pre-op Diagnosis  Closed RIGHT trimalleolar ankle fracture    Post-Op Diagnosis: Same       Procedure:  Closed reduction and external fixation RIGHT trimalleolar ankle fracture  INDEPENDENT INTERPRETATION OF INTRA-OPERATIVE FLUOROSCOPY      Surgeon(s):  Beola Cord., MD    Assistant:  Surgical Assistant: Gentry Fitz    Anesthesia: General    Estimated Blood Loss (mL): < 5cc    Complications: None    Specimens:   * No specimens in log *    Implants:  Implant Name Type Inv. Item Serial No. Manufacturer Lot No. LRB No. Used Action          1 Implanted       Stryker large ex-fix      Drains:   External Urinary Catheter (Active)   Site Assessment Clean,dry & intact 05/28/22 2300       Findings: closed right trimalleolar ankle fracture with small posterior medial fracture blister ~ 3cm in diameter    TT: thigh 333mmHg x 25 min      Electronically signed by Loraine Maple, MD on 05/29/2022 at 7:10 PM

## 2022-05-30 LAB — CULTURE, BLOOD 2: Culture: NO GROWTH

## 2022-05-30 LAB — PHOSPHORUS: Phosphorus: 3.8 MG/DL (ref 2.6–4.7)

## 2022-05-30 MED ORDER — ONDANSETRON 4 MG PO TBDP
4 MG | Freq: Three times a day (TID) | ORAL | Status: DC | PRN
Start: 2022-05-30 — End: 2022-06-16

## 2022-05-30 MED ORDER — FENTANYL CITRATE (PF) 100 MCG/2ML IJ SOLN
100 MCG/2ML | Freq: Once | INTRAMUSCULAR | Status: DC | PRN
Start: 2022-05-30 — End: 2022-05-29

## 2022-05-30 MED ORDER — ONDANSETRON HCL 4 MG/2ML IJ SOLN
4 MG/2ML | Freq: Four times a day (QID) | INTRAMUSCULAR | Status: DC | PRN
Start: 2022-05-30 — End: 2022-06-16

## 2022-05-30 MED ORDER — HYDROMORPHONE 0.5MG/0.5ML IJ SOLN
1 MG/ML | Status: DC | PRN
Start: 2022-05-30 — End: 2022-05-29

## 2022-05-30 MED ORDER — HYDROCODONE-ACETAMINOPHEN 5-325 MG PO TABS
5-325 MG | ORAL | Status: DC | PRN
Start: 2022-05-30 — End: 2022-06-12
  Administered 2022-05-31 – 2022-06-12 (×12): 1 via ORAL

## 2022-05-30 MED ORDER — NORMAL SALINE FLUSH 0.9 % IV SOLN
0.9 % | INTRAVENOUS | Status: DC | PRN
Start: 2022-05-30 — End: 2022-06-16

## 2022-05-30 MED ORDER — COSYNTROPIN 0.25 MG IJ SOLR
0.25 MG | Freq: Once | INTRAMUSCULAR | Status: AC
Start: 2022-05-30 — End: 2022-05-31
  Administered 2022-05-31: 15:00:00 250 ug via INTRAVENOUS

## 2022-05-30 MED ORDER — LACTATED RINGERS IV SOLN
INTRAVENOUS | Status: DC
Start: 2022-05-30 — End: 2022-05-29

## 2022-05-30 MED ORDER — DIVALPROEX SODIUM 250 MG PO TBEC
250 MG | Freq: Two times a day (BID) | ORAL | Status: AC
Start: 2022-05-30 — End: 2022-06-16
  Administered 2022-05-31 – 2022-06-16 (×34): 500 mg via ORAL

## 2022-05-30 MED ORDER — LACTATED RINGERS IV SOLN
INTRAVENOUS | Status: DC
Start: 2022-05-30 — End: 2022-05-30
  Administered 2022-05-30: 03:00:00 via INTRAVENOUS

## 2022-05-30 MED ORDER — MORPHINE SULFATE (PF) 2 MG/ML IV SOLN
2 MG/ML | INTRAVENOUS | Status: DC | PRN
Start: 2022-05-30 — End: 2022-06-02
  Administered 2022-05-30 – 2022-06-02 (×3): 4 mg via INTRAVENOUS

## 2022-05-30 MED ORDER — DIPHENHYDRAMINE HCL 50 MG/ML IJ SOLN
50 MG/ML | Freq: Once | INTRAMUSCULAR | Status: DC | PRN
Start: 2022-05-30 — End: 2022-05-29

## 2022-05-30 MED ORDER — STERILE WATER FOR INJECTION (MIXTURES ONLY)
1 g | Freq: Three times a day (TID) | INTRAMUSCULAR | Status: AC
Start: 2022-05-30 — End: 2022-05-30
  Administered 2022-05-30 (×2): 2000 mg via INTRAVENOUS

## 2022-05-30 MED ORDER — MIDAZOLAM HCL (PF) 2 MG/2ML IJ SOLN
2 MG/ML | Freq: Once | INTRAMUSCULAR | Status: DC | PRN
Start: 2022-05-30 — End: 2022-05-29

## 2022-05-30 MED ORDER — MORPHINE SULFATE (PF) 2 MG/ML IV SOLN
2 MG/ML | INTRAVENOUS | Status: DC | PRN
Start: 2022-05-30 — End: 2022-06-02

## 2022-05-30 MED ORDER — NORMAL SALINE FLUSH 0.9 % IV SOLN
0.9 % | Freq: Two times a day (BID) | INTRAVENOUS | Status: DC
Start: 2022-05-30 — End: 2022-06-16
  Administered 2022-05-30 – 2022-06-03 (×4): 10 mL via INTRAVENOUS
  Administered 2022-06-03: 5 mL via INTRAVENOUS
  Administered 2022-06-04 – 2022-06-07 (×6): 10 mL via INTRAVENOUS
  Administered 2022-06-08: 02:00:00 20 mL via INTRAVENOUS
  Administered 2022-06-09 – 2022-06-16 (×8): 10 mL via INTRAVENOUS

## 2022-05-30 MED ORDER — ONDANSETRON HCL 4 MG/2ML IJ SOLN
4 MG/2ML | Freq: Once | INTRAMUSCULAR | Status: DC | PRN
Start: 2022-05-30 — End: 2022-05-29

## 2022-05-30 MED ORDER — SODIUM CHLORIDE 0.9 % IV SOLN
0.9 % | INTRAVENOUS | Status: DC | PRN
Start: 2022-05-30 — End: 2022-06-10

## 2022-05-30 MED ORDER — LIDOCAINE HCL (PF) 1 % IJ SOLN
1 % | Freq: Once | INTRAMUSCULAR | Status: DC | PRN
Start: 2022-05-30 — End: 2022-05-29

## 2022-05-30 MED ORDER — ENOXAPARIN SODIUM 30 MG/0.3ML IJ SOSY
300.3 MG/0.3ML | Freq: Two times a day (BID) | INTRAMUSCULAR | Status: AC
Start: 2022-05-30 — End: 2022-06-15
  Administered 2022-05-30 – 2022-06-14 (×30): 30 mg via SUBCUTANEOUS

## 2022-05-30 MED FILL — LACTATED RINGERS IV SOLN: INTRAVENOUS | Qty: 1000

## 2022-05-30 MED FILL — MORPHINE SULFATE 2 MG/ML IJ SOLN: 2 mg/mL | INTRAMUSCULAR | Qty: 2

## 2022-05-30 MED FILL — LACOSAMIDE 100 MG PO TABS: 100 MG | ORAL | Qty: 1

## 2022-05-30 MED FILL — MORPHINE SULFATE 2 MG/ML IJ SOLN: 2 mg/mL | INTRAMUSCULAR | Qty: 2 | Fill #0

## 2022-05-30 MED FILL — CEFAZOLIN SODIUM 1 G IJ SOLR: 1 g | INTRAMUSCULAR | Qty: 2000

## 2022-05-30 MED FILL — BD POSIFLUSH 0.9 % IV SOLN: 0.9 % | INTRAVENOUS | Qty: 40

## 2022-05-30 MED FILL — COSYNTROPIN 0.25 MG IJ SOLR: 0.25 MG | INTRAMUSCULAR | Qty: 250

## 2022-05-30 NOTE — Progress Notes (Signed)
[facility]  [place] [address], [place] [address]  [phone]        Hospitalist Progress Note      NAME: Meghan [name]   DOB:  1946-06-05  MRM:  [id number]    Date/Time of service: 05/30/2022  9:51 AM       Subjective:     Chief Complaint:  Patient was personally seen and examined by me during this time period.  Chart reviewed. F/up shock and hypoglycemia d/t adrenal insufficiency.    S/p CREF of right ankle fracture yesterday pm.    Having some ankle pain this am but otherwise states she feels fine and has no complaints.       Objective:       Vitals:       Last 24hrs VS reviewed since prior progress note. Most recent are:    Vitals:    05/30/22 0829   BP:    Pulse:    Resp: 18   Temp:    SpO2:      SpO2 Readings from Last 6 Encounters:   05/30/22 97%   03/25/22 94%   03/25/22 99%          Intake/Output Summary (Last 24 hours) at 05/30/2022 0951  Last data filed at 05/30/2022 0845  Gross per 24 hour   Intake 50 ml   Output 500 ml   Net -450 ml          Exam:     Physical Exam:    Gen:  Well-developed, well-nourished, in no acute distress  HEENT:  Pink conjunctivae, EOMI, hearing intact to voice, moist mucous membranes  Neck:  Supple, without masses, thyroid non-tender  Resp:  No accessory muscle use, clear breath sounds without wheezes rales or rhonchi  Card:  No murmurs, normal S1, S2 without thrills, bruits or peripheral edema  Abd:  Soft, non-tender, non-distended, normoactive bowel sounds are present, no palpable organomegaly and no detectable hernias  Musc:  Right ankle wrapped and immobilized. Sensation or motor of toes intact  Skin:  No rashes or ulcers, skin turgor is good  Neuro:  follows commands appropriately  Psych:  poor insight. Oriented to self and place. alert      Medications Reviewed: (see below)    Lab Data Reviewed: (see below)    ______________________________________________________________________    Medications:     Current Facility-Administered  Medications   Medication Dose Route Frequency    lacosamide (VIMPAT) tablet 100 mg  100 mg Oral BID    sodium chloride flush 0.9 % injection 5-40 mL  5-40 mL IntraVENous 2 times per day    sodium chloride flush 0.9 % injection 5-40 mL  5-40 mL IntraVENous PRN    0.9 % sodium chloride infusion   IntraVENous PRN    ondansetron (ZOFRAN-ODT) disintegrating tablet 4 mg  4 mg Oral Q8H PRN    Or    ondansetron (ZOFRAN) injection 4 mg  4 mg IntraVENous Q6H PRN    lactated ringers IV soln infusion   IntraVENous Continuous    enoxaparin Sodium (LOVENOX) injection 30 mg  30 mg SubCUTAneous BID    HYDROcodone-acetaminophen (NORCO) 5-325 MG per tablet 1 tablet  1 tablet Oral Q4H PRN    morphine (PF) injection 2 mg  2 mg IntraVENous Q2H PRN    Or    morphine (PF) injection 4 mg  4 mg IntraVENous Q2H PRN    hydrALAZINE (APRESOLINE) injection 10 mg  10 mg IntraVENous Q6H PRN    bupivacaine (PF) (MARCAINE) 0.5 % injection 150 mg  30 mL IntraDERmal Once    iopamidol (ISOVUE-370) 76 % injection 100 mL  100 mL IntraVENous ONCE PRN    [Held by provider] enoxaparin (LOVENOX) injection 40 mg  40 mg SubCUTAneous Daily    glucose chewable tablet 16 g  4 tablet Oral PRN    dextrose bolus 10% 125 mL  125 mL IntraVENous PRN    Or    dextrose bolus 10% 250 mL  250 mL IntraVENous PRN    dextrose 10 % infusion   IntraVENous Continuous PRN    valproate (DEPACON) 500 mg in sodium chloride 0.9 % 50 mL infusion  500 mg IntraVENous BID    naloxone (NARCAN) injection 0.4 mg  0.4 mg IntraVENous PRN    sodium chloride flush 0.9 % injection 5-40 mL  5-40 mL IntraVENous 2 times per day    0.9 % sodium chloride infusion   IntraVENous PRN    polyethylene glycol (GLYCOLAX) packet 17 g  17 g Oral Daily PRN    acetaminophen (TYLENOL) tablet 650 mg  650 mg Oral Q6H PRN    Or    acetaminophen (TYLENOL) suppository 650 mg  650 mg Rectal Q6H PRN    dextrose bolus 10% 125 mL  125 mL IntraVENous PRN    Or    dextrose bolus 10% 250 mL  250 mL IntraVENous PRN     glucagon injection 1 mg  1 mg SubCUTAneous PRN    dextrose 10 % infusion   IntraVENous Continuous PRN    [Held by provider] aspirin chewable tablet 81 mg  81 mg Oral Daily    citalopram (CELEXA) tablet 10 mg  10 mg Oral Daily    donepezil (ARICEPT) tablet 10 mg  10 mg Oral Nightly    memantine (NAMENDA) tablet 10 mg  10 mg Oral BID    rOPINIRole (REQUIP) tablet 1 mg  1 mg Oral Nightly          Lab Review:     No results for input(s): "WBC", "HGB", "HCT", "PLT" in the last 72 hours.    Recent Labs     05/30/22  0225   PHOS 3.8       No results found for: "GLUCPOC"       Assessment / Plan:     Witnessed GLF with right ankle fracture: Not POA. Witnessed. ?presyncope but I dont think so based on patients report. I think this was more mechanical/weakness related.Very mild trop elevation was flat. Ct head neg. S/p CREF by ortho on 10/16.  - PT/OT  - Will need placement  - Fall precautions  - Orthostatics    Encephalopathy/ Hx of stroke of cerebrum/ Hx of seizure: POA. She seems to be back to baseline now that she's not in shock. MRI brain NAP. EEG without seizure. CT head ok. Very mild trop elevation was flat.  - Monitor    Shock 2/2 adrenal insufficiency: POA. Improved with steroids. Off pressor. Off fluids. Off Abx. Off steroids. ACTH and cortisol labs a wash bc started on steroids before they were drawn. TSH low-ish. US thyroid ordered to eval incidentally seen thyroid nodules found left thyroid lobe enlargement with multiple nodules, the largest 3.1 cm. I dont think thyroid is main etiology.  - ACTH stim test tomrrow am  - Appreciate endo recs    Thyroid nodules: POA. Incidental on CTA neck. US thyroid ordered to eval incidentally  seen thyroid nodules found left thyroid lobe enlargement with multiple nodules, the largest 3.1 cm. I dont think thyroid is main etiology.  - per endo  - Outpt follow up    Lactic acidosis: POA. Likely d/t the above. Resolved.\     Leukocytosis: From steroids. No s/s infection. Procal low.      AKI: POA. Acute. Resolved now that bp stable.     Hypoglycemia /  Hx Type 2 diabetes mellitus with diabetic neuropathy (HCC) POA: Off glucose containing gtt. Now with stable glucoses after steroids initiation.  - Glucose checks     Thrombocytosis: POA. Likely reactive. Stroke work-up neg.  Hold home aspirin for now due to nose bleed     Nosebleed: Acute. POA. Due to fall.  Resolved. CT head NAP.  - Afrin spray as needed     Fall: POA. Acute. D/t the above. Caretaker states they note no bruises.  CT head and neck unremarkable as above.  - PT/OT     History of complex partial seizure evolving to generalized seizure: POA. Seizure ruled out with EEG.  - Cont vimpat  - neuro added valproate  - Appreciate neuro recs     Hyperlipidemia: Continue fenofibrate once can tolerate orals and med rec confirmed.     Dementia without behavioral disturbance: POA. Chronic. Back to baseline now.  - Resume home Namenda     Parkinson's disease: POA. Chronic.  - Resume home meds when confirmed     Depression: POA. Chronic.  - Continue Celexa.    **Prior records, notes, labs, radiology, and medications reviewed in [facility]**                 Care Plan discussed with: Patient and Nursing Staff    Discussed:  Care Plan    Prophylaxis:  Lovenox    Disposition:   TBD           ___________________________________________________    Attending Physician: Renelda Loma, MD

## 2022-05-30 NOTE — Progress Notes (Signed)
Orthopedic NP Progress Note  Post Op Day: 1 Day Post-Op    May 30, 2022 11:29 AM     [name]    Attending Physician: Treatment Team: Attending Provider: Andrena Mews, MD; Consulting Physician: Emelda Brothers, MD; Case Manager: Justice Deeds; Consulting Physician: Cathlyn Parsons, MD; Nursing Student: Inez Pilgrim; Consulting Physician: Leron Croak, APRN - NP; Consulting Physician: Kathy Breach, MD; Surgeon: Beola Cord., MD; Registered Nurse: Audrea Muscat, RN; Patient Care Tech: Lamonte Richer; Charge Nurse: Windell Moment, RN; Physical Therapist Assistant: Kary Kos, PTA     Vital Signs:    Patient Vitals for the past 8 hrs:   BP Temp Temp src Pulse Resp SpO2   05/30/22 1101 (!) 142/82 99.3 F (37.4 C) Oral 97 19 95 %   05/30/22 0829 -- -- -- -- 18 --   05/30/22 0728 (!) 168/78 99.3 F (37.4 C) Oral 96 17 97 %   05/30/22 0413 (!) 168/79 99.5 F (37.5 C) Oral (!) 118 18 94 %          Intake/Output:  10/17 0701 - 10/17 1900  In: -   Out: 500 [Urine:500]  10/15 1901 - 10/17 0700  In: 6644 [P.O.:200; I.V.:675]  Out: -     Pain Control:        LAB:    No results for input(s): "HCT", "HGB" in the last 72 hours.  Lab Results   Component Value Date/Time    NA 144 05/26/2022 03:14 AM    K 4.1 05/26/2022 03:14 AM    CL 114 05/26/2022 03:14 AM    CO2 25 05/26/2022 03:14 AM    BUN 12 05/26/2022 03:14 AM       Subjective:  Meghan [name] is a 76 y.o. female s/p a  Procedure(s):  ANKLE EXTERNAL FIXATOR APPLICATION RIGHT   Procedure(s):  ANKLE EXTERNAL FIXATOR APPLICATION RIGHT. Tolerating diet. Pt resting in bed, no c/o's        Objective: General: alert, cooperative, no distress.    Neuro/Vascular: CNS Intact.  Sensation stable. Brisk cap refill, + pulses UE/LE  Musculoskeletal:   RLE - ex fix to ankle, able to wiggle toes, NVI     Skin: warm and dry   Dressing - clean, dry and intact.              PT/OT:   Gait:                      Assessment:    s/p Procedure(s):  ANKLE  EXTERNAL FIXATOR APPLICATION RIGHT    Principal Problem:    Encephalopathy  Active Problems:    AKI (acute kidney injury) ([facility])    Hypoglycemia    Stroke (cerebrum) (HCC)    Complex partial seizure evolving to generalized seizure ([place])    Dementia without behavioral disturbance (HCC)    HLD (hyperlipidemia)    Type 2 diabetes mellitus with diabetic neuropathy (HCC)    Closed fracture dislocation of ankle joint, right, initial encounter  Resolved Problems:    * No resolved hospital problems. *       Plan:   -  R trimal fracture s/p Ex Fix placement - NWB RLE with PT/OT, ice and elevate ono 2-3 pillows at all times, pin care, pain management as per orders   - Lovenox daily to resume today   - per Dr. Dema Severin he will plan tentatively plan to remove the external fixator and  perform ORIF of her right ankle fracture on Thursday this week if her skin swelling allows.  If her swelling gets worse and she develops more fracture blisters, we will need to wait at least until next week.  We will follow her in the hospital.      Signed By: Luciano Cutter, APRN - NP    Orthopedic Nurse Practitioner

## 2022-05-30 NOTE — Plan of Care (Signed)
Problem: Skin/Tissue Integrity  Goal: Absence of new skin breakdown  Description: 1.  Monitor for areas of redness and/or skin breakdown  2.  Assess vascular access sites hourly  3.  Every 4-6 hours minimum:  Change oxygen saturation probe site  4.  Every 4-6 hours:  If on nasal continuous positive airway pressure, respiratory therapy assess nares and determine need for appliance change or resting period.  05/30/2022 5573 by Lucilla Edin, Kenwood Rosiak, RN  Outcome: Progressing  05/30/2022 0303 by Lucilla Edin, Casyn Becvar, RN  Outcome: Progressing     Problem: Chronic Conditions and Co-morbidities  Goal: Patient's chronic conditions and co-morbidity symptoms are monitored and maintained or improved  Outcome: Progressing     Problem: Discharge Planning  Goal: Discharge to home or other facility with appropriate resources  Outcome: Progressing     Problem: Neurosensory - Adult  Goal: Achieves stable or improved neurological status  Outcome: Progressing     Problem: Respiratory - Adult  Goal: Achieves optimal ventilation and oxygenation  Outcome: Progressing     Problem: Cardiovascular - Adult  Goal: Maintains optimal cardiac output and hemodynamic stability  Outcome: Progressing     Problem: Skin/Tissue Integrity - Adult  Goal: Skin integrity remains intact  Outcome: Progressing     Problem: Musculoskeletal - Adult  Goal: Return mobility to safest level of function  Outcome: Progressing     Problem: Gastrointestinal - Adult  Goal: Minimal or absence of nausea and vomiting  Outcome: Progressing      Problem: Infection - Adult  Goal: Absence of infection at discharge  Outcome: Progressing     Problem: Metabolic/Fluid and Electrolytes - Adult  Goal: Electrolytes maintained within normal limits  Outcome: Progressing     Problem: Pain  Goal: Verbalizes/displays adequate comfort level or baseline comfort level  Outcome: Progressing     Problem: Nutrition Deficit:  Goal: Optimize nutritional status  Outcome: Progressing      Problem: ABCDS Injury Assessment  Goal: Absence of physical injury  Outcome: Progressing

## 2022-05-30 NOTE — Progress Notes (Signed)
Gave pt Tylenol for fever of 99.9. Messaged provider

## 2022-05-30 NOTE — Plan of Care (Signed)
Problem: Skin/Tissue Integrity  Goal: Absence of new skin breakdown  Description: 1.  Monitor for areas of redness and/or skin breakdown  2.  Assess vascular access sites hourly  3.  Every 4-6 hours minimum:  Change oxygen saturation probe site  4.  Every 4-6 hours:  If on nasal continuous positive airway pressure, respiratory therapy assess nares and determine need for appliance change or resting period.  05/30/2022 2025 by Audrea Muscat, RN  Outcome: Progressing  05/30/2022 0833 by Lucilla Edin, Andriene, RN  Outcome: Progressing     Problem: Chronic Conditions and Co-morbidities  Goal: Patient's chronic conditions and co-morbidity symptoms are monitored and maintained or improved  05/30/2022 2025 by Audrea Muscat, RN  Outcome: Progressing  05/30/2022 0833 by Lucilla Edin, Andriene, RN  Outcome: Progressing     Problem: Discharge Planning  Goal: Discharge to home or other facility with appropriate resources  05/30/2022 2025 by Audrea Muscat, RN  Outcome: Progressing  05/30/2022 0833 by Lucilla Edin, Andriene, RN  Outcome: Progressing     Problem: Neurosensory - Adult  Goal: Achieves stable or improved neurological status  05/30/2022 0833 by Lucilla Edin, Andriene, RN  Outcome: Progressing     Problem: Respiratory - Adult  Goal: Achieves optimal ventilation and oxygenation  05/30/2022 2025 by Audrea Muscat, RN  Outcome: Progressing  05/30/2022 0833 by Lucilla Edin, Andriene, RN  Outcome: Progressing     Problem: Cardiovascular - Adult  Goal: Maintains optimal cardiac output and hemodynamic stability  05/30/2022 2025 by Audrea Muscat, RN  Outcome: Progressing  05/30/2022 0833 by Lucilla Edin, Andriene, RN  Outcome: Progressing     Problem: Skin/Tissue Integrity - Adult  Goal: Skin integrity remains intact  05/30/2022 2025 by Audrea Muscat, RN  Outcome: Progressing  05/30/2022 0833 by Lucilla Edin, Andriene, RN  Outcome: Progressing     Problem: Gastrointestinal - Adult  Goal: Minimal or  absence of nausea and vomiting  05/30/2022 2025 by Audrea Muscat, RN  Outcome: Progressing  05/30/2022 0833 by Lucilla Edin, Andriene, RN  Outcome: Progressing     Problem: Genitourinary - Adult  Goal: Absence of urinary retention  05/30/2022 2025 by Audrea Muscat, RN  Outcome: Progressing  05/30/2022 0833 by Lucilla Edin, Andriene, RN  Outcome: Progressing     Problem: Infection - Adult  Goal: Absence of infection at discharge  05/30/2022 2025 by Audrea Muscat, RN  Outcome: Progressing  05/30/2022 0833 by Lucilla Edin, Andriene, RN  Outcome: Progressing     Problem: Metabolic/Fluid and Electrolytes - Adult  Goal: Electrolytes maintained within normal limits  05/30/2022 2025 by Audrea Muscat, RN  Outcome: Progressing  05/30/2022 0833 by Lucilla Edin, Andriene, RN  Outcome: Progressing     Problem: Hematologic - Adult  Goal: Maintains hematologic stability  05/30/2022 2025 by Audrea Muscat, RN  Outcome: Progressing  05/30/2022 0833 by Lucilla Edin, Andriene, RN  Outcome: Progressing     Problem: Nutrition Deficit:  Goal: Optimize nutritional status  05/30/2022 2025 by Audrea Muscat, RN  Outcome: Progressing  05/30/2022 0833 by Lucilla Edin, Andriene, RN  Outcome: Progressing     Problem: ABCDS Injury Assessment  Goal: Absence of physical injury  05/30/2022 2025 by Audrea Muscat, RN  Outcome: Progressing  05/30/2022 0833 by Lucilla Edin, Andriene, RN  Outcome: Progressing     Problem: Safety - Adult  Goal: Free from fall injury  05/30/2022 2025 by Audrea Muscat, RN  Outcome: Not Progressing  05/30/2022 0833 by Lucilla Edin, Andriene, RN  Outcome: Progressing  Problem: Musculoskeletal - Adult  Goal: Return mobility to safest level of function  05/30/2022 2025 by Dimas Chyle, RN  Outcome: Not Progressing  05/30/2022 0833 by Heriberto Antigua, Andriene, RN  Outcome: Progressing     Problem: Pain  Goal: Verbalizes/displays adequate comfort level or baseline comfort level  05/30/2022  2025 by Dimas Chyle, RN  Outcome: Not Progressing  05/30/2022 0833 by Heriberto Antigua, Andriene, RN  Outcome: Progressing

## 2022-05-30 NOTE — Care Coordination-Inpatient (Addendum)
Transitions of Care Plan  1.return to group home vs. SNF  2. CM to follow-up with decision maker regarding recs  3. Pt will likely need medical transport at d/c               3:30pm: CM spoke with Pt's CG Lilia Pro in regards to potential d/c plan for SNF. Cm also inquired about medical POA. CM was informed that Pt makes her own decisions. CG inquired about care needs and stated that they are willing to accept Pt back to the home and would be able to accommodate her care needs.     CG mentioned getting a wheelchair ramp installed at the home.       9am: Per IDRs Pt may benefit from SNF due to recent ankle fracture. CM to follow-up with family regarding potential SNF recommendation.        Florene Glen, [place], [place]  [phone]

## 2022-05-30 NOTE — Progress Notes (Signed)
Physical Therapy  Attempted to see patient,initiated evaluation but required abort due to patient with elevated blood pressure at rest (193/101, heart rate 100 bpm)./  Nursing notified to provide BP medication.  Rudie Meyer PT,DPT,NCS,CLT

## 2022-05-31 LAB — ALDOSTERONE & RENIN, DIRECT WITH RATIO
ALDOSTERONE/RENIN RATIO: <5.4 (ref 0.0–30.0)
Aldosterone: <1 ng/dL (ref 0.0–30.0)
Renin Activity: 0.186 ng/mL/hr (ref 0.167–5.380)

## 2022-05-31 LAB — CORTISOL TOTAL
Cortisol, Random: 24.5 ug/dL
Cortisol, Random: 36.8 ug/dL
Cortisol, Random: 10.9 ug/dL

## 2022-05-31 LAB — CULTURE, BLOOD 1: Culture: NO GROWTH

## 2022-05-31 MED FILL — DONEPEZIL HCL 5 MG PO TABS: 5 MG | ORAL | Qty: 2

## 2022-05-31 MED FILL — DIVALPROEX SODIUM 250 MG PO TBEC: 250 MG | ORAL | Qty: 2

## 2022-05-31 MED FILL — ENOXAPARIN SODIUM 30 MG/0.3ML IJ SOSY: 30 MG/0.3ML | INTRAMUSCULAR | Qty: 0.3

## 2022-05-31 MED FILL — LACOSAMIDE 100 MG PO TABS: 100 MG | ORAL | Qty: 1

## 2022-05-31 MED FILL — ROPINIROLE HCL 0.25 MG PO TABS: 0.25 MG | ORAL | Qty: 4

## 2022-05-31 MED FILL — HYDROCODONE-ACETAMINOPHEN 5-325 MG PO TABS: 5-325 MG | ORAL | Qty: 1

## 2022-05-31 MED FILL — MEMANTINE HCL 10 MG PO TABS: 10 MG | ORAL | Qty: 1

## 2022-05-31 MED FILL — HYDRALAZINE HCL 20 MG/ML IJ SOLN: 20 MG/ML | INTRAMUSCULAR | Qty: 1

## 2022-05-31 MED FILL — CITALOPRAM HYDROBROMIDE 20 MG PO TABS: 20 MG | ORAL | Qty: 1

## 2022-05-31 NOTE — Plan of Care (Signed)
Problem: Skin/Tissue Integrity  Goal: Absence of new skin breakdown  Description: 1.  Monitor for areas of redness and/or skin breakdown  2.  Assess vascular access sites hourly  3.  Every 4-6 hours minimum:  Change oxygen saturation probe site  4.  Every 4-6 hours:  If on nasal continuous positive airway pressure, respiratory therapy assess nares and determine need for appliance change or resting period.  05/31/2022 0326 by Heriberto Antigua, Jolly Mango, RN  Outcome: Progressing  05/30/2022 2025 by Dimas Chyle, RN  Outcome: Progressing     Problem: Safety - Adult  Goal: Free from fall injury  05/31/2022 0326 by Heriberto Antigua, Jolly Mango, RN  Outcome: Progressing  05/30/2022 2025 by Dimas Chyle, RN  Outcome: Not Progressing     Problem: Chronic Conditions and Co-morbidities  Goal: Patient's chronic conditions and co-morbidity symptoms are monitored and maintained or improved  05/31/2022 0326 by Heriberto Antigua, Jolly Mango, RN  Outcome: Progressing  05/30/2022 2025 by Dimas Chyle, RN  Outcome: Progressing     Problem: Discharge Planning  Goal: Discharge to home or other facility with appropriate resources  05/31/2022 0326 by Heriberto Antigua, Jolly Mango, RN  Outcome: Progressing  05/30/2022 2025 by Dimas Chyle, RN  Outcome: Progressing     Problem: Neurosensory - Adult  Goal: Achieves stable or improved neurological status  Outcome: Progressing     Problem: Respiratory - Adult  Goal: Achieves optimal ventilation and oxygenation  05/31/2022 0326 by Heriberto Antigua, Celvin Taney, RN  Outcome: Progressing  05/30/2022 2025 by Dimas Chyle, RN  Outcome: Progressing     Problem: Cardiovascular - Adult  Goal: Maintains optimal cardiac output and hemodynamic stability  05/31/2022 0326 by Heriberto Antigua, Jolly Mango, RN  Outcome: Progressing  05/30/2022 2025 by Dimas Chyle, RN  Outcome: Progressing     Problem: Skin/Tissue Integrity - Adult  Goal: Skin integrity remains intact  05/31/2022 0326 by Heriberto Antigua, Corbitt Cloke,  RN  Outcome: Progressing  05/30/2022 2025 by Dimas Chyle, RN  Outcome: Progressing     Problem: Musculoskeletal - Adult  Goal: Return mobility to safest level of function  05/31/2022 0326 by Heriberto Antigua, Jolly Mango, RN  Outcome: Progressing  05/30/2022 2025 by Dimas Chyle, RN  Outcome: Not Progressing     Problem: Gastrointestinal - Adult  Goal: Minimal or absence of nausea and vomiting  05/31/2022 0326 by Heriberto Antigua, Jolly Mango, RN  Outcome: Progressing  05/30/2022 2025 by Dimas Chyle, RN  Outcome: Progressing     Problem: Genitourinary - Adult  Goal: Absence of urinary retention  05/31/2022 0326 by Heriberto Antigua, Jolly Mango, RN  Outcome: Progressing  05/30/2022 2025 by Dimas Chyle, RN  Outcome: Progressing     Problem: Infection - Adult  Goal: Absence of infection at discharge  05/31/2022 0326 by Heriberto Antigua, Jolly Mango, RN  Outcome: Progressing  05/30/2022 2025 by Dimas Chyle, RN  Outcome: Progressing     Problem: Metabolic/Fluid and Electrolytes - Adult  Goal: Electrolytes maintained within normal limits  05/31/2022 0326 by Heriberto Antigua, Jolly Mango, RN  Outcome: Progressing  05/30/2022 2025 by Dimas Chyle, RN  Outcome: Progressing     Problem: Hematologic - Adult  Goal: Maintains hematologic stability  05/31/2022 0326 by Heriberto Antigua, Izetta Sakamoto, RN  Outcome: Progressing  05/30/2022 2025 by Dimas Chyle, RN  Outcome: Progressing     Problem: Pain  Goal: Verbalizes/displays adequate comfort level or baseline comfort level  05/31/2022 0326 by Heriberto Antigua, Jolly Mango, RN  Outcome: Progressing  05/30/2022 2025 by Dimas Chyle, RN  Outcome: Not Progressing  Problem: Nutrition Deficit:  Goal: Optimize nutritional status  05/31/2022 0326 by Lucilla Edin, Etta Grandchild, RN  Outcome: Progressing  05/30/2022 2025 by Audrea Muscat, RN  Outcome: Progressing     Problem: ABCDS Injury Assessment  Goal: Absence of physical injury  05/31/2022 0326 by Adrian Prince, RN  Outcome:  Progressing  05/30/2022 2025 by Audrea Muscat, RN  Outcome: Progressing

## 2022-05-31 NOTE — Progress Notes (Signed)
[facility]  [address], [place] [address]  [phone]    [facility]                                                                                          Hospitalist Progress Note  Anselm Lis, MD        Date of Service:  05/31/2022  NAME:  Meghan [name]  DOB:  [birth year]  MRN:  [id number]      Admission Summary:   76 yo female is admitted with shock and hypoglycemia due to adrenal insufficiency.     Interval history / Subjective:     Having some ankle pain but otherwise seems a little confused     Assessment & Plan:     Witnessed GLF with right ankle fracture: Not POA. Witnessed. ?presyncope but I dont think so based on patients report. I think this was more mechanical/weakness related.Very mild trop elevation was flat. Ct head neg. S/p CREF by ortho on 10/16.  - PT/OT  - Will need placement, ortho planning for next week  - Fall precautions  - Orthostatics     Encephalopathy/ Hx of stroke of cerebrum/ Hx of seizure: POA. She seems to be back to baseline now that she's not in shock. MRI brain NAP. EEG without seizure. CT head ok. Very mild trop elevation was flat.  - Monitor     Shock 2/2 adrenal insufficiency: POA. Improved with steroids. Off pressor. Off fluids. Off Abx. Off steroids. ACTH and cortisol labs a wash bc started on steroids before they were drawn. TSH low-ish. US thyroid ordered to eval incidentally seen thyroid nodules found left thyroid lobe enlargement with multiple nodules, the largest 3.1 cm. I dont think thyroid is main etiology.  - ACTH stim test done today, pending results  - Appreciate endo recs     Thyroid nodules: POA. Incidental on CTA neck. US thyroid ordered to eval incidentally seen thyroid nodules found left thyroid lobe enlargement with multiple nodules, the largest 3.1 cm. I dont think thyroid is main etiology.  - per endo  - Outpt follow up     Lactic acidosis: POA. Likely d/t the above.  Resolved.\     Leukocytosis: From steroids. No s/s infection. Procal low.     AKI: POA. Acute. Resolved now that bp stable.     Hypoglycemia /  Hx Type 2 diabetes mellitus with diabetic neuropathy (HCC) POA: Off glucose containing gtt. Now with stable glucoses after steroids initiation.  - Glucose checks     Thrombocytosis: POA. Likely reactive. Stroke work-up neg.  Hold home aspirin for now due to nose bleed     Nosebleed: Acute. POA. Due to fall.  Resolved. CT head NAP.  - Afrin spray as needed     Fall: POA. Acute. D/t the above. Caretaker states they note no bruises.  CT head and neck unremarkable as above.  - PT/OT     History of complex partial seizure evolving to generalized seizure: POA. Seizure ruled out with EEG.  - Cont vimpat  -  neuro added valproate  - Appreciate neuro recs        Dementia without behavioral disturbance: POA. Chronic. Back to baseline now.  - Resume home Namenda     Depression: POA. Chronic.  - Continue Celexa.    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       Vital Signs:    Last 24hrs VS reviewed since prior progress note. Most recent are:  Vitals:    05/31/22 1543   BP: 112/72   Pulse: 96   Resp: 17   Temp: 97.9 F (36.6 C)   SpO2: 99%         Intake/Output Summary (Last 24 hours) at  05/31/2022 1644  Last data filed at 05/31/2022 1241  Gross per 24 hour   Intake 0 ml   Output 1600 ml   Net -1600 ml        Physical Examination:       Gen:  Well-developed, well-nourished, in no acute distress  HEENT:  Pink conjunctivae, EOMI, hearing intact to voice, moist mucous membranes  Neck:  Supple, without masses, thyroid non-tender  Resp:  No accessory muscle use, clear breath sounds without wheezes rales or rhonchi  Card:  No murmurs, normal S1, S2 without thrills, bruits or peripheral edema  Abd:  Soft, non-tender, non-distended, normoactive bowel sounds are present, no palpable organomegaly and no detectable hernias  Musc:  Right ankle wrapped and immobilized. Sensation or motor of toes intact  Skin:  No rashes or ulcers, skin turgor is good  Neuro:  follows commands appropriately  Psych:  poor insight. Oriented to self and place. alert       Data Review:    Review and/or order of clinical lab test      Labs:   No results for input(s): "WBC", "HGB", "HCT", "PLT" in the last 72 hours.  Recent Labs     05/30/22  0225   PHOS 3.8     Lab Results   Component Value Date/Time    ALT 13 05/24/2022 11:17 PM    ALT 24 05/23/2022 09:43 AM    ALT 19 03/24/2022 07:31 PM    GLOB 3.0 05/24/2022 11:17 PM    GLOB 4.3 05/23/2022 09:43 AM    GLOB 4.2 03/24/2022 07:31 PM     Lab Results   Component Value Date/Time    INR 1.2 08/01/2018 06:54 PM      No results found for: "IRON", "TIBC", "FERR"   No results found for: "FOL", "RBCF"   No results for input(s): "PH", "PCO2", "PO2" in the last 72 hours.  No results found for: "CPK"  Lab Results  Component Value Date/Time    CHOL 198 02/25/2018 03:49 AM    HDL 48 02/25/2018 03:49 AM     No results found for: "GLUCPOC"  No results found for: "UA"      Medications Reviewed:     Current Facility-Administered Medications   Medication Dose Route Frequency    divalproex (DEPAKOTE) DR tablet 500 mg  500 mg Oral 2 times per day    lacosamide (VIMPAT) tablet 100 mg  100 mg Oral BID     sodium chloride flush 0.9 % injection 5-40 mL  5-40 mL IntraVENous 2 times per day    sodium chloride flush 0.9 % injection 5-40 mL  5-40 mL IntraVENous PRN    0.9 % sodium chloride infusion   IntraVENous PRN    ondansetron (ZOFRAN-ODT) disintegrating tablet 4 mg  4 mg Oral Q8H PRN    Or    ondansetron (ZOFRAN) injection 4 mg  4 mg IntraVENous Q6H PRN    enoxaparin Sodium (LOVENOX) injection 30 mg  30 mg SubCUTAneous BID    HYDROcodone-acetaminophen (NORCO) 5-325 MG per tablet 1 tablet  1 tablet Oral Q4H PRN    morphine (PF) injection 2 mg  2 mg IntraVENous Q2H PRN    Or    morphine (PF) injection 4 mg  4 mg IntraVENous Q2H PRN    hydrALAZINE (APRESOLINE) injection 10 mg  10 mg IntraVENous Q6H PRN    bupivacaine (PF) (MARCAINE) 0.5 % injection 150 mg  30 mL IntraDERmal Once    iopamidol (ISOVUE-370) 76 % injection 100 mL  100 mL IntraVENous ONCE PRN    glucose chewable tablet 16 g  4 tablet Oral PRN    dextrose bolus 10% 125 mL  125 mL IntraVENous PRN    Or    dextrose bolus 10% 250 mL  250 mL IntraVENous PRN    dextrose 10 % infusion   IntraVENous Continuous PRN    naloxone (NARCAN) injection 0.4 mg  0.4 mg IntraVENous PRN    sodium chloride flush 0.9 % injection 5-40 mL  5-40 mL IntraVENous 2 times per day    0.9 % sodium chloride infusion   IntraVENous PRN    polyethylene glycol (GLYCOLAX) packet 17 g  17 g Oral Daily PRN    acetaminophen (TYLENOL) tablet 650 mg  650 mg Oral Q6H PRN    Or    acetaminophen (TYLENOL) suppository 650 mg  650 mg Rectal Q6H PRN    dextrose bolus 10% 125 mL  125 mL IntraVENous PRN    Or    dextrose bolus 10% 250 mL  250 mL IntraVENous PRN    glucagon injection 1 mg  1 mg SubCUTAneous PRN    dextrose 10 % infusion   IntraVENous Continuous PRN    [Held by provider] aspirin chewable tablet 81 mg  81 mg Oral Daily    citalopram (CELEXA) tablet 10 mg  10 mg Oral Daily    donepezil (ARICEPT) tablet 10 mg  10 mg Oral Nightly    memantine (NAMENDA) tablet 10 mg  10 mg Oral BID    rOPINIRole  (REQUIP) tablet 1 mg  1 mg Oral Nightly     ______________________________________________________________________  EXPECTED LENGTH OF STAY:    ACTUAL LENGTH OF STAY:          8                 Pryor Ochoa, MD

## 2022-05-31 NOTE — Plan of Care (Signed)
Problem: Skin/Tissue Integrity  Goal: Absence of new skin breakdown  Description: 1.  Monitor for areas of redness and/or skin breakdown  2.  Assess vascular access sites hourly  3.  Every 4-6 hours minimum:  Change oxygen saturation probe site  4.  Every 4-6 hours:  If on nasal continuous positive airway pressure, respiratory therapy assess nares and determine need for appliance change or resting period.  Outcome: Progressing     Problem: Physical Therapy - Adult  Goal: By Discharge: Performs mobility at highest level of function for planned discharge setting.  See evaluation for individualized goals.  Description: Physical Therapy Goals  Revised 05/31/2022  1.  Patient will move from supine to sit and sit to supine  in bed with minimal assistance/contact guard assist within 7 day(s).  2.  Patient will do lateral transfer from bed to chair and chair to bed with minimal assistance/contact guard assist using the least restrictive device within 7 day(s).  3.  Patient will perform sit to stand with minimal assistance/contact guard assist within 7 day(s).  4.  Patient will ambulate with minimal assistance/contact guard assist for 5x2 feet with the least restrictive device within 7 day(s).        FUNCTIONAL STATUS PRIOR TO ADMISSION: Patient was modified independent using a rolling walker for functional mobility. The patient lives in [place] and is in town visiting her sister who lives locally.     HOME SUPPORT PRIOR TO ADMISSION: The patient lived with her significant other in [place] but did not require assistance. She reports 2 falls in the past month.     Physical Therapy Goals  Initiated 05/24/2022  1.  Patient will move from supine to sit and sit to supine, scoot up and down, and roll side to side in bed with independence within 7 day(s).    2.  Patient will perform sit to stand with contact guard assist within 7 day(s).  3.  Patient will transfer from bed to chair and chair to bed with supervision/set-up using the least  restrictive device within 7 day(s).  4.  Patient will ambulate with supervision/set-up for 50 feet with the least restrictive device within 7 day(s).   5.  Patient will ascend/descend 4 stairs with 1-2 handrail(s) with contact guard assist within 7 day(s).    05/31/2022 1459 by Aldona Bar, PT  Outcome: Progressing     Problem: Occupational Therapy - Adult  Goal: By Discharge: Performs self-care activities at highest level of function for planned discharge setting.  See evaluation for individualized goals.  Description: FUNCTIONAL STATUS PRIOR TO ADMISSION:  Pt is independent with ADLs and mobility.      HOME SUPPORT: Patient is a poor historian, reporting she is visiting from out of town ([place]) and is staying with her sister here in [place]. Per chart she resides in a Group Home.    Occupational Therapy Goals:  Re-Evaluation 05/31/2022 - now RLE NWB s/p R ankle external fixation (sustained R ankle fracture during GLF this admission)  1.  Patient will perform seated grooming with Supervision/Set-up within 7 day(s).  2.  Patient will perform lateral transfer to drop-arm chair/BSC with Minimal Assist x1 in prep for OOB ADL engagementwithin 7 day(s).  3.  Patient will perform seated/standing bathing with Moderate Assist within 7 day(s).  4.  Patient will perform stand-pivot toilet transfers with Moderate Assist and Assist x2 using RW within 7 day(s).  5.  Patient will perform all aspects of toileting with Minimal  Assist within 7 day(s).  6.  Patient will participate in upper extremity therapeutic exercise/activities with Supervision for 10 minutes within 7 day(s).    7.  Patient will utilize energy conservation and fall prevention techniques during functional activities with verbal cues within 7 day(s).    Initiated 05/24/2022  1.  Patient will perform grooming, standing at sink, with Modified Independence within 7 day(s).  2.  Patient will perform lower body dressing with Modified Independence within 7  day(s).  3.  Patient will perform bathing, sitting and standing PRN, with Supervision within 7 day(s).  4.  Patient will perform toilet transfers with Modified Independence  within 7 day(s).  5.  Patient will perform all aspects of toileting with Modified Independence within 7 day(s).  6.  Patient will participate in upper extremity therapeutic exercise/activities with Independence for 10 minutes within 7 day(s).    05/31/2022 1551 by Arlyce Harman, OT  Outcome: Progressing  05/31/2022 1550 by Arlyce Harman, OT  Outcome: Progressing     Problem: Occupational Therapy - Adult  Goal: By Discharge: Performs self-care activities at highest level of function for planned discharge setting.  See evaluation for individualized goals.  Description: FUNCTIONAL STATUS PRIOR TO ADMISSION:  Pt is independent with ADLs and mobility.      HOME SUPPORT: Patient is a poor historian, reporting she is visiting from out of town ([place]) and is staying with her sister here in [place]. Per chart she resides in a Group Home.    Occupational Therapy Goals:  Re-Evaluation 05/31/2022 - now RLE NWB s/p R ankle external fixation (sustained R ankle fracture during GLF this admission)  1.  Patient will perform seated grooming with Supervision/Set-up within 7 day(s).  2.  Patient will perform lateral transfer to drop-arm chair/BSC with Minimal Assist x1 in prep for OOB ADL engagementwithin 7 day(s).  3.  Patient will perform seated/standing bathing with Moderate Assist within 7 day(s).  4.  Patient will perform stand-pivot toilet transfers with Moderate Assist and Assist x2 using RW within 7 day(s).  5.  Patient will perform all aspects of toileting with Minimal Assist within 7 day(s).  6.  Patient will participate in upper extremity therapeutic exercise/activities with Supervision for 10 minutes within 7 day(s).    7.  Patient will utilize energy conservation and fall prevention techniques during functional activities with verbal cues  within 7 day(s).    Initiated 05/24/2022  1.  Patient will perform grooming, standing at sink, with Modified Independence within 7 day(s).  2.  Patient will perform lower body dressing with Modified Independence within 7 day(s).  3.  Patient will perform bathing, sitting and standing PRN, with Supervision within 7 day(s).  4.  Patient will perform toilet transfers with Modified Independence  within 7 day(s).  5.  Patient will perform all aspects of toileting with Modified Independence within 7 day(s).  6.  Patient will participate in upper extremity therapeutic exercise/activities with Independence for 10 minutes within 7 day(s).    05/31/2022 1551 by Arlyce Harman, OT  Outcome: Progressing  05/31/2022 1550 by Arlyce Harman, OT  Outcome: Progressing     Problem: Chronic Conditions and Co-morbidities  Goal: Patient's chronic conditions and co-morbidity symptoms are monitored and maintained or improved  Outcome: Progressing     Problem: Discharge Planning  Goal: Discharge to home or other facility with appropriate resources  Outcome: Progressing     Problem: Neurosensory - Adult  Goal: Achieves stable or improved neurological status  Outcome:  Progressing     Problem: Cardiovascular - Adult  Goal: Maintains optimal cardiac output and hemodynamic stability  Outcome: Progressing     Problem: Respiratory - Adult  Goal: Achieves optimal ventilation and oxygenation  Outcome: Progressing

## 2022-05-31 NOTE — Progress Notes (Signed)
Orthopedic NP Progress Note  Post Op Day: 2 Days Post-Op    May 31, 2022 1:30 PM     [name]    Attending Physician: Treatment Team: Attending Provider: Anselm Lis, MD; Consulting Physician: Emelda Brothers, MD; Case Manager: Justice Deeds; Consulting Physician: Cathlyn Parsons, MD; Nursing Student: Inez Pilgrim; Consulting Physician: Leron Croak, APRN - NP; Consulting Physician: Kathy Breach, MD; Surgeon: Beola Cord., MD; Registered Nurse: Merideth Abbey, RN; Occupational Therapist: Isabella Stalling, OT; Physical Therapist: Gilles Chiquito, PT     Vital Signs:    Patient Vitals for the past 8 hrs:   BP Temp Temp src Pulse Resp SpO2   05/31/22 1233 128/69 99.5 F (37.5 C) Oral (!) 101 16 97 %   05/31/22 0748 (!) 155/74 100.2 F (37.9 C) Oral (!) 122 16 96 %          Intake/Output:  No intake/output data recorded.  10/16 1901 - 10/18 0700  In: 50 [P.O.:50]  Out: 2400 [Urine:2400]    Pain Control:        LAB:    No results for input(s): "HCT", "HGB" in the last 72 hours.  Lab Results   Component Value Date/Time    NA 144 05/26/2022 03:14 AM    K 4.1 05/26/2022 03:14 AM    CL 114 05/26/2022 03:14 AM    CO2 25 05/26/2022 03:14 AM    BUN 12 05/26/2022 03:14 AM       Subjective:  Meghan [name] is a 76 y.o. female s/p a  Procedure(s):  ANKLE EXTERNAL FIXATOR APPLICATION RIGHT   Procedure(s):  ANKLE EXTERNAL FIXATOR APPLICATION RIGHT. Tolerating diet. Resting in bed,no c/o's        Objective: General: alert, cooperative, no distress.    Neuro/Vascular: CNS Intact.  Sensation stable. Brisk cap refill, 2+ pulses UE/LE  Musculoskeletal:    RLE - dressing removed, large blisters noted, pin sites clean dry, able to wiggle toes, NVI   Skin: warm and dry   Dressing - dry serosanguinous drainage to dressing                PT/OT:   Gait:                      Assessment:    s/p Procedure(s):  ANKLE EXTERNAL FIXATOR APPLICATION RIGHT    Principal Problem:    Encephalopathy  Active  Problems:    AKI (acute kidney injury) ([facility])    Hypoglycemia    Stroke (cerebrum) (HCC)    Complex partial seizure evolving to generalized seizure ([facility])    Dementia without behavioral disturbance (HCC)    HLD (hyperlipidemia)    Type 2 diabetes mellitus with diabetic neuropathy (HCC)    Closed fracture dislocation of ankle joint, right, initial encounter  Resolved Problems:    * No resolved hospital problems. *       Plan:   -  -  R trimal fracture s/p Ex Fix placement - NWB RLE with PT/OT, ice and elevate ono 2-3 pillows at all times, pin care completed and new dressing placed, pain management as per orders   - Lovenox daily to resume today   - discussed exam with Dr. Dema Severin he will plan to wait at least until next Thursday for formal ORIF due to edema and blisters.  We will follow her in the hospital.      Discharge Planning:  Dr.     aware and agree with plan.     Signed By: Gladstone Lighter, APRN - NP    Orthopedic Nurse Practitioner

## 2022-05-31 NOTE — Plan of Care (Signed)
Problem: Occupational Therapy - Adult  Goal: By Discharge: Performs self-care activities at highest level of function for planned discharge setting.  See evaluation for individualized goals.  Description: FUNCTIONAL STATUS PRIOR TO ADMISSION:  Pt is independent with ADLs and mobility.      HOME SUPPORT: Patient is a poor historian, reporting she is visiting from out of town ([place]) and is staying with her sister here in [place]. Per chart she resides in a Group Home.    Occupational Therapy Goals:  Re-Evaluation 05/31/2022 - now RLE NWB s/p R ankle external fixation (sustained R ankle fracture during GLF this admission)  1.  Patient will perform seated grooming with Supervision/Set-up within 7 day(s).  2.  Patient will perform lateral transfer to drop-arm chair/BSC with Minimal Assist x1 in prep for OOB ADL engagementwithin 7 day(s).  3.  Patient will perform seated/standing bathing with Moderate Assist within 7 day(s).  4.  Patient will perform stand-pivot toilet transfers with Moderate Assist and Assist x2 using RW within 7 day(s).  5.  Patient will perform all aspects of toileting with Minimal Assist within 7 day(s).  6.  Patient will participate in upper extremity therapeutic exercise/activities with Supervision for 10 minutes within 7 day(s).    7.  Patient will utilize energy conservation and fall prevention techniques during functional activities with verbal cues within 7 day(s).    Initiated 05/24/2022  1.  Patient will perform grooming, standing at sink, with Modified Independence within 7 day(s).  2.  Patient will perform lower body dressing with Modified Independence within 7 day(s).  3.  Patient will perform bathing, sitting and standing PRN, with Supervision within 7 day(s).  4.  Patient will perform toilet transfers with Modified Independence  within 7 day(s).  5.  Patient will perform all aspects of toileting with Modified Independence within 7 day(s).  6.  Patient will participate in upper  extremity therapeutic exercise/activities with Independence for 10 minutes within 7 day(s).    05/31/2022 1551 by Arlyce Harman, OT  Outcome: Progressing   OCCUPATIONAL THERAPY RE-EVALUATION  Patient: Meghan [name] (76 y.o. female)  Date: 05/31/2022  Primary Diagnosis: Encephalopathy [G93.40]  Acute encephalopathy [G93.40]  Closed head injury, initial encounter [S09.90XA]  Unwitnessed fall [R29.6]  Procedure(s) (LRB):  ANKLE EXTERNAL FIXATOR APPLICATION RIGHT (Right) 2 Days Post-Op     Precautions: Weight Bearing, Fall Risk (NWB RLE) Right Lower Extremity Weight Bearing: Non Weight Bearing              Chart, occupational therapy assessment, plan of care, and goals were reviewed.    ASSESSMENT  The patient is limited by decreased functional mobility, independence in ADLs, strength, body mechanics, activity tolerance, endurance, safety awareness, cognition, command following, attention/concentration, coordination, balance, and increased pain levels. Patient now POD 2 application of R ankle external fixator with NWB RLE.     This date, patient received resting in bed, initially drowsy but rousing easily. Patient participates in functional transfer training in prep for EOB and OOB ADL engagement, tolerating well with minimal reports of pain. Patient achieves sit<>stand several times with max assist x2 (one assist needed to maintain RLE NWB and one assist needed to achieve stand). Patient fatigues quickly, however is able to scoot laterally with RLE assist after standing trials. At this time, patient is far from baseline with significant physical assist needed for all activity and functional mobility. Anticipate need for rehab at discharge.     Functional Outcome Measure:  The patient scored  17/24 on the [facility].         PLAN :  Recommendations and Planned Interventions:   self care training, therapeutic activities, functional mobility training, balance training, therapeutic exercise, endurance activities, patient  education, and home safety training    Frequency/Duration: OT Plan of Care: 5 times/week    Recommendation for discharge: (in order for the patient to meet his/her long term goals): Therapy up to 5 days/week in Rehab facility    Other factors to consider for discharge: patient's current support system is unable to meet their requirements for physical assistance, impaired cognition, high risk for falls, not safe to be alone, concern for safely navigating or managing the home environment, and new weight bearing restrictions limiting activity or patient is unable to maintain    IF patient discharges home will need the following DME:  TBD       SUBJECTIVE:   Patient stated "A little pain."    OBJECTIVE DATA SUMMARY:   Hospital course since last seen and reason for reevaluation: GLF with sustained R ankle fracture; s/p application of R ankle external fixator    Cognitive/Behavioral Status:    Orientation  Overall Orientation Status: Within Normal Limits  Orientation Level: Oriented X4  Cognition  Overall Cognitive Status: Exceptions  Arousal/Alertness: Delayed responses to stimuli  Following Commands: Follows one step commands with repetition (HOH with noted cognitive delay; hears best on R)  Attention Span: Attends with cues to redirect  Memory: Decreased recall of precautions  Safety Judgement: Decreased awareness of need for safety;Decreased awareness of need for assistance  Problem Solving: Assistance required to implement solutions  Insights: Decreased awareness of deficits  Initiation: Requires cues for some  Sequencing: Requires cues for some    Skin:     Edema:     Hearing:   Hearing  Hearing: Exceptions to [facility]  Hearing Exceptions: Hard of hearing/hearing concerns (hears bes on R)    Vision/Perceptual:    Vision  Vision: Within Functional Limits     Range of Motion:   AROM: Generally decreased, functional    Strength:  Strength: Generally decreased, functional    Coordination:  Coordination: Generally decreased,  functional  Coordination: Generally decreased, functional      Tone & Sensation:       Functional Mobility and Transfers for ADLs:  Bed Mobility:  Bed Mobility Training  Bed Mobility Training: Yes  Overall Level of Assistance: Moderate assistance;Minimum assistance;Assist X2 (RLE guidance with ext fixator)  Rolling: Minimum assistance;Additional time  Supine to Sit: Minimum assistance;Moderate assistance;Assist X2;Additional time  Sit to Supine: Minimum assistance;Moderate assistance;Assist X2;Additional time  Scooting: Minimum assistance (for RLE ext fixator guidance)    Transfers:  Transfer Training  Transfer Training: No  Sit to Stand: Moderate assistance;Assist X2;Additional time;Maximum assistance  Stand to Sit: Moderate assistance;Assist X2;Additional time    Balance:  Standing: Impaired  Balance  Sitting: Intact  Sitting - Static: Good (unsupported)  Sitting - Dynamic: Fair (occasional)  Standing: Impaired  Standing - Static: Fair;Poor;Constant support  Standing - Dynamic: Poor    ADL Assessment:     Feeding: Setup       Grooming: Setup (In seated.)       UE Bathing: Setup;Minimal assistance       LE Bathing: Moderate assistance;Maximum assistance       UE Dressing: Setup       LE Dressing: Moderate assistance;Maximum assistance       Toileting: Moderate assistance;Maximum assistance    ADL Intervention  and Therapeutic Activity:  Patient participates in functional transfer training in prep for increased safety and independence in OOB toileting and other ADL engagement. Patient tolerates several trials sit<>stand, requiring max assist x1 to maintain RLE NWB and max assist x1 to achieve sit>stand from EOB. Patient is able to scoot laterally EOB with min assist to manage RLE and maintain NWB. Discussed potential use of drop-arm recliner or BSC with nursing.     [employer] AM-PACTM "6 Clicks"                                                       Daily Activity Inpatient Short Form  How much help from  another person does the patient currently need... Total; A Lot A Little None   1.  Putting on and taking off regular lower body clothing? [x]   1 []   2 []   3 []   4   2.  Bathing (including washing, rinsing, drying)? []   1 [x]   2 []   3 []   4   3.  Toileting, which includes using toilet, bedpan or urinal? []  1 [x]   2 []   3 []   4   4.  Putting on and taking off regular upper body clothing? []   1 []   2 []   3 [x]   4   5.  Taking care of personal grooming such as brushing teeth? []   1 []   2 []   3 [x]   4   6.  Eating meals? []   1 []   2 []   3 [x]   4    2007, Trustees of , under license to [place], [place]. All rights reserved     Score: 17/24     Interpretation of Tool:  Represents clinically-significant functional categories (i.e. Activities of daily living).    Cutoff score 39.4 (19) correlates to a good likelihood of discharging home versus a facility  Diane U. , , , . Passek, . , AM-PAC "6-Clicks" Functional Assessment Scores Predict Acute Care Hospital Discharge Destination, Physical Therapy, Volume 94, Issue 9, 14 April 2013, Pages [phone],    Pain Rating:  Reports mild pain with standing attempts.     Pain Intervention(s):   rest, elevation, and repositioning    Activity Tolerance:   Fair  and requires rest breaks  Please refer to the flowsheet for vital signs taken during this treatment.    After treatment:   Patient left in no apparent distress in bed, Call bell within reach, Bed/ chair alarm activated, Side rails x3, and Heels elevated for pressure relief    COMMUNICATION/EDUCATION:   The patient's plan of care was discussed with: physical therapist and registered nurse    Patient Education  Education Given To: Patient  Education Provided: Role of Therapy;Plan of Care;ADL Adaptive Strategies;Equipment;Fall Prevention Method:  Demonstration;Verbal  Barriers to Learning: Cognition;Hearing  Education Outcome: Verbalized understanding;Continued education needed    Thank you for this referral.  , OT  Minutes: 20
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
## 2022-06-01 MED ORDER — ALIGN PO CAPS
Freq: Every day | ORAL | Status: DC
Start: 2022-06-01 — End: 2022-06-16
  Administered 2022-06-02 – 2022-06-15 (×15): 1 via ORAL

## 2022-06-01 MED FILL — DIVALPROEX SODIUM 250 MG PO TBEC: 250 MG | ORAL | Qty: 2

## 2022-06-01 MED FILL — MEMANTINE HCL 10 MG PO TABS: 10 MG | ORAL | Qty: 1

## 2022-06-01 MED FILL — HYDROCODONE-ACETAMINOPHEN 5-325 MG PO TABS: 5-325 MG | ORAL | Qty: 1

## 2022-06-01 MED FILL — LACOSAMIDE 100 MG PO TABS: 100 MG | ORAL | Qty: 1

## 2022-06-01 MED FILL — DONEPEZIL HCL 5 MG PO TABS: 5 MG | ORAL | Qty: 2

## 2022-06-01 MED FILL — ENOXAPARIN SODIUM 30 MG/0.3ML IJ SOSY: 30 MG/0.3ML | INTRAMUSCULAR | Qty: 0.3

## 2022-06-01 MED FILL — CITALOPRAM HYDROBROMIDE 20 MG PO TABS: 20 MG | ORAL | Qty: 1

## 2022-06-01 MED FILL — ROPINIROLE HCL 0.25 MG PO TABS: 0.25 MG | ORAL | Qty: 4

## 2022-06-01 NOTE — Plan of Care (Cosign Needed)
Problem: Skin/Tissue Integrity  Goal: Absence of new skin breakdown  Description: 1.  Monitor for areas of redness and/or skin breakdown  2.  Assess vascular access sites hourly  3.  Every 4-6 hours minimum:  Change oxygen saturation probe site  4.  Every 4-6 hours:  If on nasal continuous positive airway pressure, respiratory therapy assess nares and determine need for appliance change or resting period.  Outcome: Progressing     Problem: Safety - Adult  Goal: Free from fall injury  Outcome: Progressing     Problem: Physical Therapy - Adult  Goal: By Discharge: Performs mobility at highest level of function for planned discharge setting.  See evaluation for individualized goals.  Description: Physical Therapy Goals  Revised 05/31/2022  1.  Patient will move from supine to sit and sit to supine  in bed with minimal assistance/contact guard assist within 7 day(s).  2.  Patient will do lateral transfer from bed to chair and chair to bed with minimal assistance/contact guard assist using the least restrictive device within 7 day(s).  3.  Patient will perform sit to stand with minimal assistance/contact guard assist within 7 day(s).  4.  Patient will ambulate with minimal assistance/contact guard assist for 5x2 feet with the least restrictive device within 7 day(s).        FUNCTIONAL STATUS PRIOR TO ADMISSION: Patient was modified independent using a rolling walker for functional mobility. The patient lives in [place] and is in town visiting her sister who lives locally.     HOME SUPPORT PRIOR TO ADMISSION: The patient lived with her significant other in [place] but did not require assistance. She reports 2 falls in the past month.     Physical Therapy Goals  Initiated 05/24/2022  1.  Patient will move from supine to sit and sit to supine, scoot up and down, and roll side to side in bed with independence within 7 day(s).    2.  Patient will perform sit to stand with contact guard assist within 7 day(s).  3.  Patient will  transfer from bed to chair and chair to bed with supervision/set-up using the least restrictive device within 7 day(s).  4.  Patient will ambulate with supervision/set-up for 50 feet with the least restrictive device within 7 day(s).   5.  Patient will ascend/descend 4 stairs with 1-2 handrail(s) with contact guard assist within 7 day(s).    06/01/2022 1627 by Regan Lemming, PTA  Outcome: Progressing  Note:        Problem: Chronic Conditions and Co-morbidities  Goal: Patient's chronic conditions and co-morbidity symptoms are monitored and maintained or improved  Outcome: Progressing     Problem: Occupational Therapy - Adult  Goal: By Discharge: Performs self-care activities at highest level of function for planned discharge setting.  See evaluation for individualized goals.  Description: FUNCTIONAL STATUS PRIOR TO ADMISSION:  Pt is independent with ADLs and mobility.      HOME SUPPORT: Patient is a poor historian, reporting she is visiting from out of town ([place]) and is staying with her sister here in [place]. Per chart she resides in a Group Home.    Occupational Therapy Goals:  Re-Evaluation 05/31/2022 - now RLE NWB s/p R ankle external fixation (sustained R ankle fracture during GLF this admission)  1.  Patient will perform seated grooming with Supervision/Set-up within 7 day(s).  2.  Patient will perform lateral transfer to drop-arm chair/BSC with Minimal Assist x1 in prep for OOB ADL engagementwithin  7 day(s).  3.  Patient will perform seated/standing bathing with Moderate Assist within 7 day(s).  4.  Patient will perform stand-pivot toilet transfers with Moderate Assist and Assist x2 using RW within 7 day(s).  5.  Patient will perform all aspects of toileting with Minimal Assist within 7 day(s).  6.  Patient will participate in upper extremity therapeutic exercise/activities with Supervision for 10 minutes within 7 day(s).    7.  Patient will utilize energy conservation and fall prevention  techniques during functional activities with verbal cues within 7 day(s).    Initiated 05/24/2022  1.  Patient will perform grooming, standing at sink, with Modified Independence within 7 day(s).  2.  Patient will perform lower body dressing with Modified Independence within 7 day(s).  3.  Patient will perform bathing, sitting and standing PRN, with Supervision within 7 day(s).  4.  Patient will perform toilet transfers with Modified Independence  within 7 day(s).  5.  Patient will perform all aspects of toileting with Modified Independence within 7 day(s).  6.  Patient will participate in upper extremity therapeutic exercise/activities with Independence for 10 minutes within 7 day(s).    06/01/2022 1648 by Marlis Edelson, OTR/L  Outcome: Progressing     Problem: Discharge Planning  Goal: Discharge to home or other facility with appropriate resources  Outcome: Progressing     Problem: Neurosensory - Adult  Goal: Achieves stable or improved neurological status  Outcome: Progressing     Problem: Respiratory - Adult  Goal: Achieves optimal ventilation and oxygenation  Outcome: Progressing     Problem: Cardiovascular - Adult  Goal: Maintains optimal cardiac output and hemodynamic stability  Outcome: Progressing     Problem: Skin/Tissue Integrity - Adult  Goal: Skin integrity remains intact  Outcome: Progressing     Problem: Musculoskeletal - Adult  Goal: Return mobility to safest level of function  Outcome: Progressing     Problem: Gastrointestinal - Adult  Goal: Minimal or absence of nausea and vomiting  Outcome: Progressing     Problem: Genitourinary - Adult  Goal: Absence of urinary retention  Outcome: Progressing     Problem: Infection - Adult  Goal: Absence of infection at discharge  Outcome: Progressing     Problem: Metabolic/Fluid and Electrolytes - Adult  Goal: Electrolytes maintained within normal limits  Outcome: Progressing     Problem: Hematologic - Adult  Goal: Maintains hematologic stability  Outcome:  Progressing     Problem: Pain  Goal: Verbalizes/displays adequate comfort level or baseline comfort level  Outcome: Progressing     Problem: Nutrition Deficit:  Goal: Optimize nutritional status  Outcome: Progressing     Problem: ABCDS Injury Assessment  Goal: Absence of physical injury  Outcome: Progressing

## 2022-06-01 NOTE — Plan of Care (Signed)
Problem: Physical Therapy - Adult  Goal: By Discharge: Performs mobility at highest level of function for planned discharge setting.  See evaluation for individualized goals.  Description: Physical Therapy Goals  Revised 05/31/2022  1.  Patient will move from supine to sit and sit to supine  in bed with minimal assistance/contact guard assist within 7 day(s).  2.  Patient will do lateral transfer from bed to chair and chair to bed with minimal assistance/contact guard assist using the least restrictive device within 7 day(s).  3.  Patient will perform sit to stand with minimal assistance/contact guard assist within 7 day(s).  4.  Patient will ambulate with minimal assistance/contact guard assist for 5x2 feet with the least restrictive device within 7 day(s).        FUNCTIONAL STATUS PRIOR TO ADMISSION: Patient was modified independent using a rolling walker for functional mobility. The patient lives in [place] and is in town visiting her sister who lives locally.     HOME SUPPORT PRIOR TO ADMISSION: The patient lived with her significant other in [place] but did not require assistance. She reports 2 falls in the past month.     Physical Therapy Goals  Initiated 05/24/2022  1.  Patient will move from supine to sit and sit to supine, scoot up and down, and roll side to side in bed with independence within 7 day(s).    2.  Patient will perform sit to stand with contact guard assist within 7 day(s).  3.  Patient will transfer from bed to chair and chair to bed with supervision/set-up using the least restrictive device within 7 day(s).  4.  Patient will ambulate with supervision/set-up for 50 feet with the least restrictive device within 7 day(s).   5.  Patient will ascend/descend 4 stairs with 1-2 handrail(s) with contact guard assist within 7 day(s).    Outcome: Progressing  Note: PHYSICAL THERAPY TREATMENT    Patient: Meghan [name] (76 y.o. female)  Date: 06/01/2022  Diagnosis: Encephalopathy [G93.40]  Acute  encephalopathy [G93.40]  Closed head injury, initial encounter [S09.90XA]  Unwitnessed fall [R29.6] Encephalopathy  Procedure(s) (LRB):  ANKLE EXTERNAL FIXATOR APPLICATION RIGHT (Right) 3 Days Post-Op  Precautions: Weight Bearing, Fall Risk (NWB RLE) Right Lower Extremity Weight Bearing: Non Weight Bearing                  ASSESSMENT:  Patient continues to benefit from skilled PT services and is slowly progressing towards goals. Very HOH. Demonstrates good lateral scooting with NWB on RLE. Difficulty with motor planning for attempt to stand with Ax 2 and RW support,          PLAN:  Patient continues to benefit from skilled intervention to address the above impairments.  Continue treatment per established plan of care.    Recommendation for discharge: (in order for the patient to meet his/her long term goals): Therapy up to 5 days/week in rehab facility    Other factors to consider for discharge: impaired cognition, high risk for falls, concern for safely navigating or managing the home environment, and new weight bearing restrictions limiting activity or patient is unable to maintain    IF patient discharges home will need the following DME: bedside commode with drop arm and wheelchair 18 inch with elevating, swing away leg rests        SUBJECTIVE:   Patient stated, "ok."    OBJECTIVE DATA SUMMARY:   Critical Behavior:          Functional Mobility  Training:  Bed Mobility:  Bed Mobility Training  Interventions: Safety awareness training;Weight shifting training/pressure relief;Verbal cues  Supine to Sit: Contact-guard assistance;Additional time  Sit to Supine: Minimum assistance;Assist X1;Additional time  Scooting: Minimum assistance;Assist X1  Transfers:     Balance:  Balance  Sitting: Intact  Sitting - Static: Good (unsupported)  Sitting - Dynamic: Fair (occasional)  Standing: Impaired  Standing - Static: Poor   Ambulation/Gait Training:        Neuro Re-Education:                    Pain Rating:  2/10   Pain  Intervention(s):       Activity Tolerance:   Good and requires rest breaks    After treatment:   Patient left in no apparent distress in bed, Call bell within reach, and Bed/ chair alarm activated      COMMUNICATION/EDUCATION:   The patient's plan of care was discussed with: occupational therapist and registered nurse    Patient Education  Education Given To: Patient  Education Provided: Transfer Training;Home Exercise Program;Role of Therapy;Precautions  Education Method: Verbal;Demonstration  Barriers to Learning: Cognition;Hearing  Education Outcome: Continued education needed      Verl Dicker, [place]  Minutes: 21

## 2022-06-01 NOTE — Care Coordination-Inpatient (Addendum)
Transitions of Care  Referral sent to [facility]  Caregiver encouraged to provide back-up choices   Will need BLS transport at discharge             2:30pm: CM received a phone call from Pt's son Meghan [name] and Daughter in-law. Pt's son and daughter in-law requested that Pt discharges to a rehab facility in [place] instead. CM was requested to send a referral to [facility]. CM was requested to contact Pt's son Meghan [name] or daughter in-law moving forward: [phone] or [phone].          11:20am: CM met with Pt and Pt's caregiver Meghan [name] at the bedside. CM discussed discharge plan with Meghan [name] and Pt. Pt's son was contacted by phone while CM was present. CM notified Pt's son of the recommendation for discharge. Pt's son was agreeable to Pt discharging to a rehab facility.     CM was instructed to send a referral to [facility].       Florene Glen, [place], [place]  [phone]

## 2022-06-01 NOTE — Consults (Addendum)
Asked to see patient re possible adrenal insufficiency. Is a 76 year old woman with a history of dementia seizure d/o, admitted to [facility] May 23, 2022 for AMS and hypotension.and hypoglycemia. On admission she had a sodium of 140 with a normal potassium, creat 1.36, glucose 132, lactate 2.8-3.7. Her glucose fluctuated modestly in association with dextrose administration. Because of her hypotension she was started on Hydrocortisone 50 mg IV  received her first dose at 1758 on 05/24/2022. A blood sample for cortisol was obtained at [place] on 05/24/2022 which returned 46.9. She has continued to receive HC 50 mg Q 8 hr.  Recent cortisol levels are > 150 consistent with HC dosing. ACTH levels have also been drawn. The patient appears to have responded to the steroids per the notes.    Patient has subsequently sustained a GLF and is S/P CREF for right ankle Fx    Over the past 9 days her steroids have been tapered off (last dose 05/27/2022) and she is now s/p ACTH stimulation test. Results as follows:  0 min 10.9  30 min 24.5  60 min 36.8    Examination  Alert woman but not oriented to place  BP 176/85  Pulse 107  AF  100% RA    Recent Labs  ACTH stim as above  Glucose 88 (range 88-157) on no antihyperglycemics  Creat 0.90    Impression  Hypotension, resolved   Hypoglycemia, resolved  Possible adrenal insufficiency but with normal ACTH stim after HC taper which effectively rules out AI. BP and glucose normal as well without steroids. ACTH level 05/26/2022 appropriately low due to concomitant steroids  Plan  Would not restart corticosteroids  Rest per team  Total time   40  minutes, more than 50% of which was in direct patient care and/or care coordination

## 2022-06-01 NOTE — Progress Notes (Signed)
[facility]  [address] [place], [place] [address]  [phone]    [facility]                                                                                          Hospitalist Progress Note  Pryor Ochoa, MD        Date of Service:  06/01/2022  NAME:  Meghan [name]  DOB:  12-Feb-1946  MRN:  [id number]      Admission Summary:   76 yo female is admitted with shock and hypoglycemia due to adrenal insufficiency.     Interval history / Subjective:     Having some ankle pain but otherwise seems a little confused but oriented x3     Assessment & Plan:     Witnessed GLF with right ankle fracture: Not POA. Witnessed. ?presyncope but I dont think so based on patients report. I think this was more mechanical/weakness related. Very mild trop elevation was flat. Ct head neg. S/p CREF by ortho on 10/16.  - PT/OT  - Will need placement, ortho planning for ORIF next week  - Fall precautions    Encephalopathy/ Hx of stroke of cerebrum/ Hx of seizure: POA. She seems to be back to baseline now that she's not in shock. MRI brain NAP. EEG without seizure. CT head ok. Very mild trop elevation was flat.  - Monitor     Shock :POA.  May have been hypovolemic. Initially thought to be from adrenal insufficiency because it Improved with steroids. This was ruled out with normal ACTH stim test normal after steroids were tapered off. Off pressor. Off fluids. Off Abx. Off steroids. TSH low-ish. US thyroid ordered to eval incidentally seen thyroid nodules found left thyroid lobe enlargement with multiple nodules, the largest 3.1 cm. Appreciate endo eval.     Thyroid nodules: POA. Incidental on CTA neck. US thyroid ordered to eval incidentally seen thyroid nodules found left thyroid lobe enlargement with multiple nodules, the largest 3.1 cm. I dont think thyroid is main etiology.  - Outpt follow up     Lactic acidosis: POA. Likely d/t the above. Resolved.      Leukocytosis: From steroids. No s/s infection. Procal low.     AKI: POA. Acute. Resolved now that bp stable.     Hypoglycemia /  Hx Type 2 diabetes mellitus with diabetic neuropathy (HCC) POA: Off glucose containing gtt. Now with stable glucoses without insulin.     Thrombocytosis: POA. Likely reactive. Stroke work-up neg.  Hold home aspirin for now due to nose bleed     Nosebleed: Acute. POA. Due to fall.  Resolved. CT head NAP.  - Afrin spray as needed     Fall: POA. Acute. D/t the above. Caretaker states they note no bruises.  CT head and neck unremarkable as above.  - PT/OT     History of complex partial seizure evolving to generalized seizure: POA. Seizure ruled out with EEG.  - Cont vimpat  - neuro added valproate  - Appreciate neuro recs  Dementia without behavioral disturbance: POA. Chronic. Back to baseline now.  - Resume home Namenda     Depression: POA. Chronic.  - Continue Celexa.    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       Vital Signs:    Last 24hrs VS reviewed since prior progress note. Most recent are:  Vitals:    06/01/22 1118   BP: 130/61   Pulse: 95   Resp: 17   Temp: 98.8 F (37.1 C)   SpO2: 95%         Intake/Output Summary (Last 24 hours) at 06/01/2022 1440  Last data filed at  06/01/2022 1147  Gross per 24 hour   Intake 350 ml   Output 600 ml   Net -250 ml          Physical Examination:       Gen:  Well-developed, well-nourished, in no acute distress  HEENT:  Pink conjunctivae, EOMI, hearing intact to voice, moist mucous membranes  Neck:  Supple, without masses, thyroid non-tender  Resp:  No accessory muscle use, clear breath sounds without wheezes rales or rhonchi  Card:  No murmurs, normal S1, S2 without thrills, bruits or peripheral edema  Abd:  Soft, non-tender, non-distended, normoactive bowel sounds are present, no palpable organomegaly and no detectable hernias  Musc:  Right ankle wrapped and immobilized. Sensation or motor of toes intact  Skin:  No rashes or ulcers, skin turgor is good  Neuro:  follows commands appropriately  Psych:  poor insight. Oriented to self and place. alert       Data Review:    Review and/or order of clinical lab test      Labs:   No results for input(s): "WBC", "HGB", "HCT", "PLT" in the last 72 hours.  Recent Labs     05/30/22  0225   PHOS 3.8       Lab Results   Component Value Date/Time    ALT 13 05/24/2022 11:17 PM    ALT 24 05/23/2022 09:43 AM    ALT 19 03/24/2022 07:31 PM    GLOB 3.0 05/24/2022 11:17 PM    GLOB 4.3 05/23/2022 09:43 AM    GLOB 4.2 03/24/2022 07:31 PM     Lab Results   Component Value Date/Time    INR 1.2 08/01/2018 06:54 PM      No results found for: "IRON", "TIBC", "FERR"   No results found for: "FOL", "RBCF"   No results for input(s): "PH", "PCO2", "PO2" in the last 72 hours.  No results found for: "CPK"  Lab Results   Component Value Date/Time    CHOL 198 02/25/2018 03:49  AM    HDL 48 02/25/2018 03:49 AM     No results found for: "GLUCPOC"  No results found for: "UA"      Medications Reviewed:     Current Facility-Administered Medications   Medication Dose Route Frequency    divalproex (DEPAKOTE) DR tablet 500 mg  500 mg Oral 2 times per day    lacosamide (VIMPAT) tablet 100 mg  100 mg Oral BID    sodium chloride flush 0.9 %  injection 5-40 mL  5-40 mL IntraVENous 2 times per day    sodium chloride flush 0.9 % injection 5-40 mL  5-40 mL IntraVENous PRN    0.9 % sodium chloride infusion   IntraVENous PRN    ondansetron (ZOFRAN-ODT) disintegrating tablet 4 mg  4 mg Oral Q8H PRN    Or    ondansetron (ZOFRAN) injection 4 mg  4 mg IntraVENous Q6H PRN    enoxaparin Sodium (LOVENOX) injection 30 mg  30 mg SubCUTAneous BID    HYDROcodone-acetaminophen (NORCO) 5-325 MG per tablet 1 tablet  1 tablet Oral Q4H PRN    morphine (PF) injection 2 mg  2 mg IntraVENous Q2H PRN    Or    morphine (PF) injection 4 mg  4 mg IntraVENous Q2H PRN    hydrALAZINE (APRESOLINE) injection 10 mg  10 mg IntraVENous Q6H PRN    bupivacaine (PF) (MARCAINE) 0.5 % injection 150 mg  30 mL IntraDERmal Once    iopamidol (ISOVUE-370) 76 % injection 100 mL  100 mL IntraVENous ONCE PRN    glucose chewable tablet 16 g  4 tablet Oral PRN    dextrose bolus 10% 125 mL  125 mL IntraVENous PRN    Or    dextrose bolus 10% 250 mL  250 mL IntraVENous PRN    dextrose 10 % infusion   IntraVENous Continuous PRN    naloxone (NARCAN) injection 0.4 mg  0.4 mg IntraVENous PRN    sodium chloride flush 0.9 % injection 5-40 mL  5-40 mL IntraVENous 2 times per day    0.9 % sodium chloride infusion   IntraVENous PRN    polyethylene glycol (GLYCOLAX) packet 17 g  17 g Oral Daily PRN    acetaminophen (TYLENOL) tablet 650 mg  650 mg Oral Q6H PRN    Or    acetaminophen (TYLENOL) suppository 650 mg  650 mg Rectal Q6H PRN    dextrose bolus 10% 125 mL  125 mL IntraVENous PRN    Or    dextrose bolus 10% 250 mL  250 mL IntraVENous PRN    glucagon injection 1 mg  1 mg SubCUTAneous PRN    dextrose 10 % infusion   IntraVENous Continuous PRN    [Held by provider] aspirin chewable tablet 81 mg  81 mg Oral Daily    citalopram (CELEXA) tablet 10 mg  10 mg Oral Daily    donepezil (ARICEPT) tablet 10 mg  10 mg Oral Nightly    memantine (NAMENDA) tablet 10 mg  10 mg Oral BID    rOPINIRole (REQUIP) tablet 1 mg  1 mg  Oral Nightly     ______________________________________________________________________  EXPECTED LENGTH OF STAY:    ACTUAL LENGTH OF STAY:          9                 Anselm Lis, MD

## 2022-06-01 NOTE — Plan of Care (Signed)
Problem: Occupational Therapy - Adult  Goal: By Discharge: Performs self-care activities at highest level of function for planned discharge setting.  See evaluation for individualized goals.  Description: FUNCTIONAL STATUS PRIOR TO ADMISSION:  Pt is independent with ADLs and mobility.      HOME SUPPORT: Patient is a poor historian, reporting she is visiting from out of town ([place]) and is staying with her sister here in [place]. Per chart she resides in a Group Home.    Occupational Therapy Goals:  Re-Evaluation 05/31/2022 - now RLE NWB s/p R ankle external fixation (sustained R ankle fracture during GLF this admission)  1.  Patient will perform seated grooming with Supervision/Set-up within 7 day(s).  2.  Patient will perform lateral transfer to drop-arm chair/BSC with Minimal Assist x1 in prep for OOB ADL engagementwithin 7 day(s).  3.  Patient will perform seated/standing bathing with Moderate Assist within 7 day(s).  4.  Patient will perform stand-pivot toilet transfers with Moderate Assist and Assist x2 using RW within 7 day(s).  5.  Patient will perform all aspects of toileting with Minimal Assist within 7 day(s).  6.  Patient will participate in upper extremity therapeutic exercise/activities with Supervision for 10 minutes within 7 day(s).    7.  Patient will utilize energy conservation and fall prevention techniques during functional activities with verbal cues within 7 day(s).    Initiated 05/24/2022  1.  Patient will perform grooming, standing at sink, with Modified Independence within 7 day(s).  2.  Patient will perform lower body dressing with Modified Independence within 7 day(s).  3.  Patient will perform bathing, sitting and standing PRN, with Supervision within 7 day(s).  4.  Patient will perform toilet transfers with Modified Independence  within 7 day(s).  5.  Patient will perform all aspects of toileting with Modified Independence within 7 day(s).  6.  Patient will participate in upper  extremity therapeutic exercise/activities with Independence for 10 minutes within 7 day(s).    Outcome: Progressing    OCCUPATIONAL THERAPY TREATMENT  Patient: Meghan [name] (76 y.o. female)  Date: 06/01/2022  Primary Diagnosis: Encephalopathy [G93.40]  Acute encephalopathy [G93.40]  Closed head injury, initial encounter [S09.90XA]  Unwitnessed fall [R29.6]  Procedure(s) (LRB):  ANKLE EXTERNAL FIXATOR APPLICATION RIGHT (Right) 3 Days Post-Op   Precautions: Weight Bearing, Fall Risk (NWB RLE) Right Lower Extremity Weight Bearing: Non Weight Bearing              Chart, occupational therapy assessment, plan of care, and goals were reviewed.    ASSESSMENT  Patient continues to benefit from skilled OT services and is slowly progressing towards goals. Patient limited by decreased comprehension of skilled instruction and decreased initiation. Noted during UE dressing to have inattention to right UE , however in supine after instructed on use of call bell demonstrated decreased initiation to use left UE. Patient also hard of hearing which further affects communication. Unable to stand with assist of 2 due to decreased command following however demonstrated good UE strength to perform sit to stand.         PLAN :  Patient continues to benefit from skilled intervention to address the above impairments.  Continue treatment per established plan of care to address goals.    Recommend with staff: elevate right LE on 2-3 pillows, skin checks    Recommend next OT session: per goals    Recommendation for discharge: (in order for the patient to meet his/her long term goals): Therapy 3 hours/day  5-7 days/week    Other factors to consider for discharge: poor safety awareness, impaired cognition, high risk for falls, not safe to be alone, concern for safely navigating or managing the home environment, and new weight bearing restrictions limiting activity or patient is unable to maintain    IF patient discharges home will need the  following DME: continuing to assess with progress       SUBJECTIVE:   Patient stated "what the hell did I do to myself?."    OBJECTIVE DATA SUMMARY:   Cognitive/Behavioral Status:  Orientation  Orientation Level: Oriented to person;Disoriented to situation;Disoriented to time       Functional Mobility and Transfers for ADLs:  Bed Mobility:  Bed Mobility Training  Interventions: Safety awareness training;Weight shifting training/pressure relief;Verbal cues  Supine to Sit: Contact-guard assistance;Additional time  Sit to Supine: Minimum assistance;Assist X1;Additional time  Scooting: Minimum assistance;Assist X1     Transfers:   Transfer Training  Sit to Stand: Total assistance (unable to facilitate stand due to decresaed command following)      Balance:  Standing: Impaired  Balance  Sitting: Intact  Sitting - Static: Good (unsupported)  Sitting - Dynamic: Fair (occasional)  Standing: Impaired  Standing - Static: Poor      ADL Intervention:    Educated on positioning of right LE on 2-3 pillows as she was not on arrival, educated on use of call bell, used only her right hand to hold call bell and max cues to orient call bell and locate red RN button       Grooming: Minimal assistance   Grooming Skilled Clinical Factors: limited by confusion          UE Dressing: Minimal assistance  UE Dressing Skilled Clinical Factors: max cues due to confusion  Noted decreased initiation to use right hand to pulls sleeve over left shoulder              Pain Rating:  No complaint   Pain Intervention(s):   rest, elevation, and repositioning      Activity Tolerance:   Good  Please refer to the flowsheet for vital signs taken during this treatment.    After treatment:   Patient left in no apparent distress in bed, Call bell within reach, Bed/ chair alarm activated, Side rails x3, and right LE elevated on 3 pillows    COMMUNICATION/EDUCATION:   The patient's plan of care was discussed with: physical therapy assistant and registered  nurse    Patient Education  Education Given To: Patient  Education Provided: Public affairs consultant Provided Comments: positioning of right LE  Education Method: Demonstration;Verbal  Barriers to Learning: Cognition  Education Outcome: Verbalized understanding;Continued education needed    Thank you for this referral.  Chyrl Civatte, OTR/L  Minutes: 24

## 2022-06-01 NOTE — Progress Notes (Signed)
Orthopaedic Progress Note  Post Op day: 3 Days Post-Op    June 01, 2022 12:42 PM     Patient: Meghan [name] MRN: [id number]  SSN: [id number]    Date of Birth: 28-Feb-1946  Age: 76 y.o.  Sex: female      Admit date:  05/23/2022  Date of Surgery:  @ORDATE @   Procedures:  Procedure(s):  ANKLE EXTERNAL FIXATOR APPLICATION RIGHT  Admitting Physician:  Haskel Schroeder, DO   Surgeon:  Juliann Mule) and Role:     * Beola Cord., MD - Primary    Consulting Physician(s): Treatment Team: Attending Provider: Anselm Lis, MD; Consulting Physician: Emelda Brothers, MD; Case Manager: Justice Deeds; Consulting Physician: Cathlyn Parsons, MD; Nursing Student: Inez Pilgrim; Consulting Physician: Leron Croak, APRN - NP; Consulting Physician: Kathy Breach, MD; Surgeon: Beola Cord., MD; LPN: Einar Gip, LPN; Charge Nurse: Shane Crutch, RN; Patient Care Tech: Lamonte Richer; Occupational Therapist: Marlis Edelson, OTR/L    SUBJECTIVE:     Meghan [name] is a 76 y.o. female is 3 Days Post-Op s/p Procedure(s):  ANKLE EXTERNAL FIXATOR APPLICATION RIGHT with an appropriate level of post-operative pain.  No complaints of nausea, vomiting, dizziness, lightheadedness, chest pain, or shortness of breath.    OBJECTIVE:       Physical Exam:  General: Alert, cooperative, no distress.  Pt is pleasantly confused.   Respiratory: Respirations unlabored  Neurological:  Neurovascular exam within normal limits.  Motor: + DF/PF.   Musculoskeletal: Right ankle in ex fix covered with ace bandage. Blister still noted an anterior ankle. Wiggles toes without difficulty. . Calves soft, supple, non-tender upon palpation.  +DF/ +PF. DP/ PT +2. SILT with BCR in toes      Vital Signs:      Patient Vitals for the past 8 hrs:   BP Temp Temp src Pulse Resp SpO2   06/01/22 1118 130/61 98.8 F (37.1 C) Oral 95 17 95 %   06/01/22 0750 (!) 176/85 98.8 F (37.1 C) Oral (!) 107 19 100 %                                           Temp (24hrs), Avg:98.8 F (37.1 C), Min:97.9 F (36.6 C), Max:99.5 F (37.5 C)      Labs:      No results for input(s): "HCT", "HGB", "INR" in the last 72 hours.  Lab Results   Component Value Date/Time    NA 144 05/26/2022 03:14 AM    K 4.1 05/26/2022 03:14 AM    CL 114 05/26/2022 03:14 AM    CO2 25 05/26/2022 03:14 AM    BUN 12 05/26/2022 03:14 AM       PT/OT:                Patient mobility                         ASSESSMENT / PLAN:   Principal Problem:    Encephalopathy  Active Problems:    AKI (acute kidney injury) ([facility])    Hypoglycemia    Stroke (cerebrum) (HCC)    Complex partial seizure evolving to generalized seizure ([facility])    Dementia without behavioral disturbance (HCC)    HLD (hyperlipidemia)    Type 2 diabetes mellitus with diabetic neuropathy ([place])  Closed fracture dislocation of ankle joint, right, initial encounter  Resolved Problems:    * No resolved hospital problems. *        -  -  R trimal fracture s/p Ex Fix placement - NWB RLE with PT/OT, ice and elevate ono 2-3 pillows at all times, pin care and dressing changes as ordered, pain management as per orders   - Lovenox daily to resume today   - J.Cox, NP discussed exam with Dr. Cliffton Asters he will plan to wait at least until next Thursday for formal ORIF due to edema and blisters.  We will follow her in the hospital.            Signed By:  Juanell Fairly, APRN - NP    Orthopedic Surgery   [facility]

## 2022-06-02 LAB — MAGNESIUM: Magnesium: 1.9 mg/dL (ref 1.6–2.4)

## 2022-06-02 LAB — CBC WITH AUTO DIFFERENTIAL
Absolute Immature Granulocyte: 0.1 10*3/uL — ABNORMAL HIGH (ref 0.00–0.04)
Basophils %: 0 % (ref 0–1)
Basophils Absolute: 0 10*3/uL (ref 0.0–0.1)
Eosinophils %: 5 % (ref 0–7)
Eosinophils Absolute: 0.4 10*3/uL (ref 0.0–0.4)
Hematocrit: 34.9 % — ABNORMAL LOW (ref 35.0–47.0)
Hemoglobin: 11.4 g/dL — ABNORMAL LOW (ref 11.5–16.0)
Immature Granulocytes: 1 % — ABNORMAL HIGH (ref 0.0–0.5)
Lymphocytes %: 25 % (ref 12–49)
Lymphocytes Absolute: 2 10*3/uL (ref 0.8–3.5)
MCH: 30 PG (ref 26.0–34.0)
MCHC: 32.7 g/dL (ref 30.0–36.5)
MCV: 91.8 FL (ref 80.0–99.0)
MPV: 10.4 FL (ref 8.9–12.9)
Monocytes %: 19 % — ABNORMAL HIGH (ref 5–13)
Monocytes Absolute: 1.5 10*3/uL — ABNORMAL HIGH (ref 0.0–1.0)
Neutrophils %: 50 % (ref 32–75)
Neutrophils Absolute: 3.8 10*3/uL (ref 1.8–8.0)
Nucleated RBCs: 0 PER 100 WBC
Platelets: 651 10*3/uL — ABNORMAL HIGH (ref 150–400)
RBC: 3.8 M/uL (ref 3.80–5.20)
RDW: 15 % — ABNORMAL HIGH (ref 11.5–14.5)
WBC: 7.8 10*3/uL (ref 3.6–11.0)
nRBC: 0 10*3/uL (ref 0.00–0.01)

## 2022-06-02 LAB — PHOSPHORUS: Phosphorus: 2.7 MG/DL (ref 2.6–4.7)

## 2022-06-02 LAB — BASIC METABOLIC PANEL
Anion Gap: 5 mmol/L (ref 5–15)
BUN: 14 MG/DL (ref 6–20)
Bun/Cre Ratio: 16 (ref 12–20)
CO2: 31 mmol/L (ref 21–32)
Calcium: 8.3 MG/DL — ABNORMAL LOW (ref 8.5–10.1)
Chloride: 103 mmol/L (ref 97–108)
Creatinine: 0.87 MG/DL (ref 0.55–1.02)
Est, Glom Filt Rate: >60 mL/min/{1.73_m2} (ref >60)
Glucose: 75 mg/dL (ref 65–100)
Potassium: 3.6 mmol/L (ref 3.5–5.1)
Sodium: 139 mmol/L (ref 136–145)

## 2022-06-02 LAB — HEMOGLOBIN A1C
Hemoglobin A1C: 5.6 % (ref 4.0–5.6)
eAG: 114 mg/dL

## 2022-06-02 MED ORDER — LISINOPRIL 5 MG PO TABS
5 MG | Freq: Every day | ORAL | Status: AC
Start: 2022-06-02 — End: 2022-06-16
  Administered 2022-06-02 – 2022-06-16 (×15): 10 mg via ORAL

## 2022-06-02 MED ORDER — HYDROMORPHONE 0.5MG/0.5ML IJ SOLN
1 MG/ML | Status: DC | PRN
Start: 2022-06-02 — End: 2022-06-12
  Administered 2022-06-04 – 2022-06-07 (×3): 0.5 mg via INTRAVENOUS

## 2022-06-02 MED FILL — DONEPEZIL HCL 5 MG PO TABS: 5 MG | ORAL | Qty: 2

## 2022-06-02 MED FILL — ENOXAPARIN SODIUM 30 MG/0.3ML IJ SOSY: 30 MG/0.3ML | INTRAMUSCULAR | Qty: 0.3

## 2022-06-02 MED FILL — MORPHINE SULFATE 2 MG/ML IJ SOLN: 2 mg/mL | INTRAMUSCULAR | Qty: 2

## 2022-06-02 MED FILL — HYDROMORPHONE HCL 1 MG/ML IJ SOLN: 1 MG/ML | INTRAMUSCULAR | Qty: 0.5

## 2022-06-02 MED FILL — DIVALPROEX SODIUM 250 MG PO TBEC: 250 MG | ORAL | Qty: 2

## 2022-06-02 MED FILL — LISINOPRIL 5 MG PO TABS: 5 MG | ORAL | Qty: 2

## 2022-06-02 MED FILL — LACOSAMIDE 100 MG PO TABS: 100 MG | ORAL | Qty: 1

## 2022-06-02 MED FILL — ROPINIROLE HCL 0.25 MG PO TABS: 0.25 MG | ORAL | Qty: 4

## 2022-06-02 MED FILL — MEMANTINE HCL 10 MG PO TABS: 10 MG | ORAL | Qty: 1

## 2022-06-02 MED FILL — RISAQUAD PO CAPS: ORAL | Qty: 1

## 2022-06-02 MED FILL — HYDRALAZINE HCL 20 MG/ML IJ SOLN: 20 MG/ML | INTRAMUSCULAR | Qty: 1

## 2022-06-02 MED FILL — CITALOPRAM HYDROBROMIDE 20 MG PO TABS: 20 MG | ORAL | Qty: 1

## 2022-06-02 NOTE — Plan of Care (Signed)
Problem: Skin/Tissue Integrity  Goal: Absence of new skin breakdown  Description: 1.  Monitor for areas of redness and/or skin breakdown  2.  Assess vascular access sites hourly  3.  Every 4-6 hours minimum:  Change oxygen saturation probe site  4.  Every 4-6 hours:  If on nasal continuous positive airway pressure, respiratory therapy assess nares and determine need for appliance change or resting period.  Outcome: Progressing     Problem: Safety - Adult  Goal: Free from fall injury  Outcome: Progressing     Problem: Chronic Conditions and Co-morbidities  Goal: Patient's chronic conditions and co-morbidity symptoms are monitored and maintained or improved  Outcome: Progressing     Problem: Discharge Planning  Goal: Discharge to home or other facility with appropriate resources  Outcome: Progressing     Problem: Neurosensory - Adult  Goal: Achieves stable or improved neurological status  Outcome: Progressing  Flowsheets (Taken 06/02/2022 2040)  Achieves stable or improved neurological status: Assess for and report changes in neurological status     Problem: Respiratory - Adult  Goal: Achieves optimal ventilation and oxygenation  Outcome: Progressing  Flowsheets (Taken 06/02/2022 2040)  Achieves optimal ventilation and oxygenation: Assess for changes in respiratory status     Problem: Cardiovascular - Adult  Goal: Maintains optimal cardiac output and hemodynamic stability  Outcome: Progressing  Flowsheets (Taken 06/02/2022 2040)  Maintains optimal cardiac output and hemodynamic stability: Monitor blood pressure and heart rate     Problem: Skin/Tissue Integrity - Adult  Goal: Skin integrity remains intact  Outcome: Progressing  Flowsheets  Taken 06/02/2022 2050  Skin Integrity Remains Intact: Monitor for areas of redness and/or skin breakdown  Taken 06/02/2022 2040  Skin Integrity Remains Intact: Monitor for areas of redness and/or skin breakdown     Problem: Musculoskeletal - Adult  Goal: Return mobility to safest  level of function  Outcome: Progressing  Flowsheets (Taken 06/02/2022 2040)  Return Mobility to Safest Level of Function: Assess patient stability and activity tolerance for standing, transferring and ambulating with or without assistive devices     Problem: Gastrointestinal - Adult  Goal: Minimal or absence of nausea and vomiting  Outcome: Progressing  Flowsheets (Taken 06/02/2022 2040)  Minimal or absence of nausea and vomiting: Administer IV fluids as ordered to ensure adequate hydration     Problem: Genitourinary - Adult  Goal: Absence of urinary retention  Outcome: Progressing  Flowsheets (Taken 06/02/2022 2040)  Absence of urinary retention: Assess patient's ability to void and empty bladder     Problem: Infection - Adult  Goal: Absence of infection at discharge  Outcome: Progressing     Problem: Metabolic/Fluid and Electrolytes - Adult  Goal: Electrolytes maintained within normal limits  Outcome: Progressing     Problem: Hematologic - Adult  Goal: Maintains hematologic stability  Outcome: Progressing     Problem: Pain  Goal: Verbalizes/displays adequate comfort level or baseline comfort level  Outcome: Progressing     Problem: Nutrition Deficit:  Goal: Optimize nutritional status  Outcome: Progressing     Problem: ABCDS Injury Assessment  Goal: Absence of physical injury  Outcome: Progressing

## 2022-06-02 NOTE — Progress Notes (Signed)
Orthopaedic Progress Note  Post Op day: 4 Days Post-Op    June 02, 2022 6:34 PM     Patient: Meghan [name] MRN: [id number]  SSN: [id number]    Date of Birth: 22-Sep-1945  Age: 76 y.o.  Sex: female      Admit date:  05/23/2022  Date of Surgery:  @ORDATE @   Procedures:  Procedure(s):  ANKLE EXTERNAL FIXATOR APPLICATION RIGHT  Admitting Physician:  , DO   Surgeon:  Horald Chestnut) and Role:     * Moishe Spice., MD - Primary    Consulting Physician(s): Treatment Team: Attending Provider: Paulino Rily, MD; Consulting Physician: Dayna Barker, MD; Case Manager: Catha Brow; Consulting Physician: Rebbeca Paul, MD; Nursing Student: Lyman Speller; Consulting Physician: Raiford Noble, APRN - NP; Consulting Physician: Gus Puma, MD; Surgeon: Carlisle Cater., MD; Patient Care Tech: Paulino Rily; Registered Nurse: Domenick Gong, RN; Patient Care Tech: Purcell Mouton; LPN: Billie Lade, LPN; Patient Care Tech: Harrie Foreman; Patient Care Tech: Hamlett, Gracelyn    SUBJECTIVE:     Meghan [name] is a 76 y.o. female is 4 Days Post-Op s/p Procedure(s):  ANKLE EXTERNAL FIXATOR APPLICATION RIGHT with an appropriate level of post-operative pain.    OBJECTIVE:       Physical Exam:  General: Alert, cooperative, no distress.    Musculoskeletal: Exfix intact to right ankle. Large fracture blister noted on anterior aspect of distal tibia extending medially and posteriorly. Pin sites intact. No erythema. Able to wiggle toes. DP/PT +1. 2. SILT with BCR in toes        Vital Signs:      Patient Vitals for the past 8 hrs:   BP Temp Temp src Pulse Resp SpO2   06/02/22 1548 (!) 120/93 98.4 F (36.9 C) Oral 96 16 97 %   06/02/22 1127 128/64 97.7 F (36.5 C) Axillary 92 16 97 %                                          Temp (24hrs), Avg:98.5 F (36.9 C), Min:97.7 F (36.5 C), Max:99.1 F (37.3 C)      Labs:        Recent Labs     06/02/22  0526   HCT 34.9*   HGB 11.4*     Lab Results    Component Value Date/Time    NA 139 06/02/2022 05:26 AM    K 3.6 06/02/2022 05:26 AM    CL 103 06/02/2022 05:26 AM    CO2 31 06/02/2022 05:26 AM    BUN 14 06/02/2022 05:26 AM       PT/OT:                Patient mobility                         ASSESSMENT / PLAN:   Principal Problem:    Encephalopathy  Active Problems:    AKI (acute kidney injury) (HCC)    Hypoglycemia    Stroke (cerebrum) (HCC)    Complex partial seizure evolving to generalized seizure (HCC)    Dementia without behavioral disturbance (HCC)    HLD (hyperlipidemia)    Type 2 diabetes mellitus with diabetic neuropathy (HCC)    Closed fracture dislocation of ankle joint, right, initial encounter  Resolved Problems:    *  No resolved hospital problems. *     76 yo female with right ankle trimalleolar fracture/ dislocation s/p exfix. Pt has developed large fracture blister almost completely around distal tibia circumferentially. Due to risk of skin integrity, compromise and size of blister that appeared to be close to draining spontaneously, decided to preformed bedside controlled drainage of blister. Procedure: Using 11 blade, made small opening in most dependent , largest area of fluid and approx. 30 cc of yellow fluid drained. No pus noted. Did not remove skin. All skin remained intact. Covered with xeroform, 4x4's and kling. All blistered appeared to decompress. Pt tolerated procedure well.   Will consult wound care for ongoing wound care treatment  Continue strict elevation  Surgery on Thursday may be postponed due to skin integrity. Will continue to follow.   Discussed case with Dr. Cliffton Asters and he agrees with above plan.                Signed By:  Juanell Fairly, APRN - NP    Orthopedic Surgery   [facility]

## 2022-06-02 NOTE — Progress Notes (Signed)
[facility]  [place], [place] [address]  [phone]        Hospitalist Progress Note      NAME: Meghan [name]   DOB:  12-26-1945  MRM:  [id number]    Date/Time of service: 06/02/2022  1:02 PM       Subjective:     Chief Complaint:  Patient was personally seen and examined by me during this time period.  Chart reviewed.  Confused.  No chest pain, SOB       Objective:       Vitals:       Last 24hrs VS reviewed since prior progress note. Most recent are:    Vitals:    06/02/22 1127   BP: 128/64   Pulse: 92   Resp: 16   Temp: 97.7 F (36.5 C)   SpO2: 97%     SpO2 Readings from Last 6 Encounters:   06/02/22 97%   03/25/22 94%   03/25/22 99%          Intake/Output Summary (Last 24 hours) at 06/02/2022 1302  Last data filed at 06/02/2022 0630  Gross per 24 hour   Intake 395.02 ml   Output 1450 ml   Net -1054.98 ml        Exam:     Physical Exam:    Gen:  elderly, frail, ill-appearing, NAD  HEENT:  Pink conjunctivae, PERRL, hearing intact to voice, moist mucous membranes  Neck:  Supple, without masses, thyroid non-tender  Resp:  No accessory muscle use, clear breath sounds without wheezes rales or rhonchi  Card:  No murmurs, normal S1, S2 without thrills, bruits or peripheral edema  Abd:  Soft, non-tender, non-distended, normoactive bowel sounds are present  Musc:  No cyanosis or clubbing  Skin:  No rashes   Neuro:  moves all ext, generalized weakness  Psych:  poor insight, oriented to person    Medications Reviewed: (see below)    Lab Data Reviewed: (see below)    ______________________________________________________________________    Medications:     Current Facility-Administered Medications   Medication Dose Route Frequency    acidophilus probiotic capsule 1 capsule  1 capsule Oral Daily    divalproex (DEPAKOTE) DR tablet 500 mg  500 mg Oral 2 times per day    lacosamide (VIMPAT) tablet 100 mg  100 mg Oral BID    sodium chloride flush 0.9 % injection 5-40 mL  5-40 mL  IntraVENous 2 times per day    sodium chloride flush 0.9 % injection 5-40 mL  5-40 mL IntraVENous PRN    0.9 % sodium chloride infusion   IntraVENous PRN    ondansetron (ZOFRAN-ODT) disintegrating tablet 4 mg  4 mg Oral Q8H PRN    Or    ondansetron (ZOFRAN) injection 4 mg  4 mg IntraVENous Q6H PRN    enoxaparin Sodium (LOVENOX) injection 30 mg  30 mg SubCUTAneous BID    HYDROcodone-acetaminophen (NORCO) 5-325 MG per tablet 1 tablet  1 tablet Oral Q4H PRN    morphine (PF) injection 2 mg  2 mg IntraVENous Q2H PRN    Or    morphine (PF) injection 4 mg  4 mg IntraVENous Q2H PRN    hydrALAZINE (APRESOLINE) injection 10 mg  10 mg IntraVENous Q6H PRN    bupivacaine (PF) (MARCAINE) 0.5 % injection 150 mg  30 mL IntraDERmal Once    iopamidol (ISOVUE-370) 76 % injection 100 mL  100 mL IntraVENous  ONCE PRN    glucose chewable tablet 16 g  4 tablet Oral PRN    dextrose bolus 10% 125 mL  125 mL IntraVENous PRN    Or    dextrose bolus 10% 250 mL  250 mL IntraVENous PRN    dextrose 10 % infusion   IntraVENous Continuous PRN    naloxone (NARCAN) injection 0.4 mg  0.4 mg IntraVENous PRN    sodium chloride flush 0.9 % injection 5-40 mL  5-40 mL IntraVENous 2 times per day    0.9 % sodium chloride infusion   IntraVENous PRN    polyethylene glycol (GLYCOLAX) packet 17 g  17 g Oral Daily PRN    acetaminophen (TYLENOL) tablet 650 mg  650 mg Oral Q6H PRN    Or    acetaminophen (TYLENOL) suppository 650 mg  650 mg Rectal Q6H PRN    dextrose bolus 10% 125 mL  125 mL IntraVENous PRN    Or    dextrose bolus 10% 250 mL  250 mL IntraVENous PRN    glucagon injection 1 mg  1 mg SubCUTAneous PRN    dextrose 10 % infusion   IntraVENous Continuous PRN    [Held by provider] aspirin chewable tablet 81 mg  81 mg Oral Daily    citalopram (CELEXA) tablet 10 mg  10 mg Oral Daily    donepezil (ARICEPT) tablet 10 mg  10 mg Oral Nightly    memantine (NAMENDA) tablet 10 mg  10 mg Oral BID    rOPINIRole (REQUIP) tablet 1 mg  1 mg Oral Nightly          Lab  Review:     Recent Labs     06/02/22  0526   WBC 7.8   HGB 11.4*   HCT 34.9*   PLT 651*     Recent Labs     06/02/22  0526   NA 139   K 3.6   CL 103   CO2 31   BUN 14   MG 1.9   PHOS 2.7     No results found for: "GLUCPOC"       Assessment / Plan:     76 yo hx of HTN, Parkinson's dementia, seizure d/o, presented w/ AMS, hypoglycemia, hypotension.  Hospital course complicated by fall, R ankle fracture s/p external fixation 10/16    1) R ankle fracture/pain: due to GLF.  S/p external fixation by Dr. Dema Severin on 10/16.  Will cont IV dilaudid prn severe pain.  Cont PT/OT.  Plan for ORIF next week    2) Acute met encephalopathy: slowly improving.  Likely from hypoglycemia, hypotension.  EEG neg for seizures.  Head MRI unremarkable.  Will monitor for agitation    3) Hypotension/hypoglycemia: now resolved.  ACTH stim test neg for adrenal insuff.  Endocrine was following    4) AKI: resolved with IVF    5) DM type 2 w/ diabetic neuropathy: A1C pending.  Off insulin due to hypoglycemia    6) Thrombocytopenia: likely due to fracture.  Will monitor     7) Parkinson's dementia/depression seems to be at baseline.  Cont Namenda, Aricept, celexa     8) Seizure d/o: EEG neg.  Cont Vimpat, depakote    9) HTN: BP stable.  Holding lisinopril due to hypotension on admission.  Can restart slowly.  Use IV hydralazine prn     **Prior records, notes, labs, radiology, and medications reviewed in [facility]**    Total time spent with patient care:  35 Minutes **I personally saw and examined the patient during this time period**                 Care Plan discussed with: Patient, nursing     Discussed:  Care Plan    Prophylaxis:  Lovenox    Disposition:  SNF/LTC           ___________________________________________________    Attending Physician: Pura Spice, MD

## 2022-06-02 NOTE — Care Coordination-Inpatient (Signed)
Transitions of Care  Referral sent to [facility]  No auth needed   Will need BLS transport at discharge              [facility] accepted Pt for rehab. Per chart review, plan for ORIF next Thursday. CM will continue to follow for discharge planning needs.       Florene Glen, [place], [place]  [phone]

## 2022-06-03 MED FILL — DIVALPROEX SODIUM 250 MG PO TBEC: 250 MG | ORAL | Qty: 2

## 2022-06-03 MED FILL — DONEPEZIL HCL 5 MG PO TABS: 5 MG | ORAL | Qty: 2

## 2022-06-03 MED FILL — LACOSAMIDE 100 MG PO TABS: 100 MG | ORAL | Qty: 1

## 2022-06-03 MED FILL — MEMANTINE HCL 10 MG PO TABS: 10 MG | ORAL | Qty: 1

## 2022-06-03 MED FILL — ROPINIROLE HCL 0.25 MG PO TABS: 0.25 MG | ORAL | Qty: 4

## 2022-06-03 MED FILL — ENOXAPARIN SODIUM 30 MG/0.3ML IJ SOSY: 30 MG/0.3ML | INTRAMUSCULAR | Qty: 0.3

## 2022-06-03 MED FILL — LISINOPRIL 5 MG PO TABS: 5 MG | ORAL | Qty: 2

## 2022-06-03 MED FILL — HYDROCODONE-ACETAMINOPHEN 5-325 MG PO TABS: 5-325 MG | ORAL | Qty: 1

## 2022-06-03 MED FILL — RISAQUAD PO CAPS: ORAL | Qty: 1

## 2022-06-03 MED FILL — CITALOPRAM HYDROBROMIDE 20 MG PO TABS: 20 MG | ORAL | Qty: 1

## 2022-06-03 NOTE — Progress Notes (Signed)
Meghan  [name], [place] [address]  [phone]        Hospitalist Progress Note      NAME: Meghan [name]   DOB:  March 08, 1946  MRM:  [id number]    Date/Time of service: 06/03/2022  11:45 AM       Subjective:     Chief Complaint:  Patient was personally seen and examined by me during this time period.  Chart reviewed.  C/o right ankle pain.  Updated Niece at bedside        Objective:       Vitals:       Last 24hrs VS reviewed since prior progress note. Most recent are:    Vitals:    06/03/22 0803   BP: (!) 153/82   Pulse: 90   Resp: 15   Temp: 98.6 F (37 C)   SpO2: 97%     SpO2 Readings from Last 6 Encounters:   06/03/22 97%   03/25/22 94%   03/25/22 99%          Intake/Output Summary (Last 24 hours) at 06/03/2022 1145  Last data filed at 06/03/2022 0835  Gross per 24 hour   Intake 475 ml   Output 250 ml   Net 225 ml          Exam:     Physical Exam:    Gen:  76 elderly, frail, ill-appearing, NAD  HEENT:  Pink conjunctivae, PERRL, hearing intact to voice, moist mucous membranes  Neck:  Supple, without masses, thyroid non-tender  Resp:  No accessory muscle use, clear breath sounds without wheezes rales or rhonchi  Card:  No murmurs, normal S1, S2 without thrills, bruits or peripheral edema  Abd:  Soft, non-tender, non-distended, normoactive bowel sounds are present  Musc:  No cyanosis or clubbing.  External fixation present   Skin:  No rashes   Neuro:  moves all ext, generalized weakness  Psych:  poor insight, oriented to person    Medications Reviewed: (see below)    Lab Data Reviewed: (see below)    ______________________________________________________________________    Medications:     Current Facility-Administered Medications   Medication Dose Route Frequency    HYDROmorphone (DILAUDID) injection 0.5 mg  0.5 mg IntraVENous Q4H PRN    lisinopril (PRINIVIL;ZESTRIL) tablet 10 mg  10 mg Oral Daily    acidophilus probiotic capsule 1 capsule  1 capsule Oral Daily     divalproex (DEPAKOTE) DR tablet 500 mg  500 mg Oral 2 times per day    lacosamide (VIMPAT) tablet 100 mg  100 mg Oral BID    sodium chloride flush 0.9 % injection 5-40 mL  5-40 mL IntraVENous 2 times per day    sodium chloride flush 0.9 % injection 5-40 mL  5-40 mL IntraVENous PRN    0.9 % sodium chloride infusion   IntraVENous PRN    ondansetron (ZOFRAN-ODT) disintegrating tablet 4 mg  4 mg Oral Q8H PRN    Or    ondansetron (ZOFRAN) injection 4 mg  4 mg IntraVENous Q6H PRN    enoxaparin Sodium (LOVENOX) injection 30 mg  30 mg SubCUTAneous BID    HYDROcodone-acetaminophen (NORCO) 5-325 MG per tablet 1 tablet  1 tablet Oral Q4H PRN    hydrALAZINE (APRESOLINE) injection 10 mg  10 mg IntraVENous Q6H PRN    bupivacaine (PF) (MARCAINE) 0.5 % injection 150 mg  30 mL IntraDERmal Once    iopamidol (ISOVUE-370) 76 %  injection 100 mL  100 mL IntraVENous ONCE PRN    glucose chewable tablet 16 g  4 tablet Oral PRN    dextrose bolus 10% 125 mL  125 mL IntraVENous PRN    Or    dextrose bolus 10% 250 mL  250 mL IntraVENous PRN    dextrose 10 % infusion   IntraVENous Continuous PRN    naloxone (NARCAN) injection 0.4 mg  0.4 mg IntraVENous PRN    sodium chloride flush 0.9 % injection 5-40 mL  5-40 mL IntraVENous 2 times per day    0.9 % sodium chloride infusion   IntraVENous PRN    polyethylene glycol (GLYCOLAX) packet 17 g  17 g Oral Daily PRN    acetaminophen (TYLENOL) tablet 650 mg  650 mg Oral Q6H PRN    Or    acetaminophen (TYLENOL) suppository 650 mg  650 mg Rectal Q6H PRN    dextrose bolus 10% 125 mL  125 mL IntraVENous PRN    Or    dextrose bolus 10% 250 mL  250 mL IntraVENous PRN    glucagon injection 1 mg  1 mg SubCUTAneous PRN    dextrose 10 % infusion   IntraVENous Continuous PRN    [Held by provider] aspirin chewable tablet 81 mg  81 mg Oral Daily    citalopram (CELEXA) tablet 10 mg  10 mg Oral Daily    donepezil (ARICEPT) tablet 10 mg  10 mg Oral Nightly    memantine (NAMENDA) tablet 10 mg  10 mg Oral BID     rOPINIRole (REQUIP) tablet 1 mg  1 mg Oral Nightly          Lab Review:     Recent Labs     06/02/22  0526   WBC 7.8   HGB 11.4*   HCT 34.9*   PLT 651*       Recent Labs     06/02/22  0526   NA 139   K 3.6   CL 103   CO2 31   BUN 14   MG 1.9   PHOS 2.7       No results found for: "GLUCPOC"       Assessment / Plan:     76 yo hx of HTN, Parkinson's dementia, seizure d/o, presented w/ AMS, hypoglycemia, hypotension.  Hospital course complicated by fall, R ankle fracture s/p external fixation 10/16    1) R ankle fracture/pain: due to GLF.  S/p external fixation by Dr. Dema Severin on 10/16.  S/p bedside debridement of blister on 10/20.  Will cont IV dilaudid prn severe pain.  Cont PT/OT.  Plan for ORIF next week.      2) Acute met encephalopathy: much improved.  Likely from hypoglycemia, hypotension.  EEG neg for seizures.  Head MRI unremarkable.  Will monitor for agitation    3) Hypotension/hypoglycemia: now resolved.  ACTH stim test neg for adrenal insuff.  Endocrine was following    4) AKI: resolved with IVF    5) HTN: BP stable.  Holding lisinopril due to hypotension on admission.  Can restart slowly.  Use IV hydralazine prn    6) DM type 2 w/ diabetic neuropathy: A1C 5.6%.  Off insulin due to hypoglycemia.  Diet control     7) Thrombocytopenia: likely due to fracture.  Will monitor     8) Parkinson's dementia/depression seems to be at baseline.  Cont Namenda, Aricept, celexa     9) Seizure d/o: EEG neg.  Cont Vimpat,  depakote    **Prior records, notes, labs, radiology, and medications reviewed in [place]**    Total time spent with patient care: 38 Minutes **I personally saw and examined the patient during this time period**                 Care Plan discussed with: Patient, nursing, family     Discussed:  Care Plan    Prophylaxis:  Lovenox    Disposition:  SNF/LTC           ___________________________________________________    Attending Physician: Pura Spice, MD

## 2022-06-03 NOTE — Plan of Care (Signed)
Problem: Skin/Tissue Integrity  Goal: Absence of new skin breakdown  Description: 1.  Monitor for areas of redness and/or skin breakdown  2.  Assess vascular access sites hourly  3.  Every 4-6 hours minimum:  Change oxygen saturation probe site  4.  Every 4-6 hours:  If on nasal continuous positive airway pressure, respiratory therapy assess nares and determine need for appliance change or resting period.  Outcome: Progressing     Problem: Safety - Adult  Goal: Free from fall injury  Outcome: Progressing     Problem: Chronic Conditions and Co-morbidities  Goal: Patient's chronic conditions and co-morbidity symptoms are monitored and maintained or improved  Outcome: Progressing     Problem: Discharge Planning  Goal: Discharge to home or other facility with appropriate resources  Outcome: Progressing     Problem: Neurosensory - Adult  Goal: Achieves stable or improved neurological status  Outcome: Progressing  Flowsheets (Taken 06/03/2022 1940)  Achieves stable or improved neurological status: Assess for and report changes in neurological status     Problem: Respiratory - Adult  Goal: Achieves optimal ventilation and oxygenation  Outcome: Progressing  Flowsheets (Taken 06/03/2022 1940)  Achieves optimal ventilation and oxygenation: Assess for changes in respiratory status     Problem: Cardiovascular - Adult  Goal: Maintains optimal cardiac output and hemodynamic stability  Outcome: Progressing  Flowsheets (Taken 06/03/2022 1940)  Maintains optimal cardiac output and hemodynamic stability: Monitor blood pressure and heart rate     Problem: Skin/Tissue Integrity - Adult  Goal: Skin integrity remains intact  Outcome: Progressing  Flowsheets  Taken 06/03/2022 1947  Skin Integrity Remains Intact: Monitor for areas of redness and/or skin breakdown  Taken 06/03/2022 1940  Skin Integrity Remains Intact: Monitor for areas of redness and/or skin breakdown     Problem: Musculoskeletal - Adult  Goal: Return mobility to safest  level of function  Outcome: Progressing  Flowsheets (Taken 06/03/2022 1940)  Return Mobility to Safest Level of Function: Assess patient stability and activity tolerance for standing, transferring and ambulating with or without assistive devices     Problem: Gastrointestinal - Adult  Goal: Minimal or absence of nausea and vomiting  Outcome: Progressing  Flowsheets (Taken 06/03/2022 1940)  Minimal or absence of nausea and vomiting: Administer IV fluids as ordered to ensure adequate hydration     Problem: Genitourinary - Adult  Goal: Absence of urinary retention  Outcome: Progressing  Flowsheets (Taken 06/03/2022 1940)  Absence of urinary retention: Assess patient's ability to void and empty bladder     Problem: Infection - Adult  Goal: Absence of infection at discharge  Outcome: Progressing  Flowsheets (Taken 06/03/2022 1947)  Absence of infection at discharge: Assess and monitor for signs and symptoms of infection     Problem: Metabolic/Fluid and Electrolytes - Adult  Goal: Electrolytes maintained within normal limits  Outcome: Progressing     Problem: Hematologic - Adult  Goal: Maintains hematologic stability  Outcome: Progressing     Problem: Pain  Goal: Verbalizes/displays adequate comfort level or baseline comfort level  Outcome: Progressing  Flowsheets  Taken 06/03/2022 1936  Verbalizes/displays adequate comfort level or baseline comfort level: Encourage patient to monitor pain and request assistance  Taken 06/03/2022 1923  Verbalizes/displays adequate comfort level or baseline comfort level: Encourage patient to monitor pain and request assistance     Problem: Nutrition Deficit:  Goal: Optimize nutritional status  Outcome: Progressing     Problem: ABCDS Injury Assessment  Goal: Absence of physical injury  Outcome: Progressing

## 2022-06-04 MED FILL — HYDROCODONE-ACETAMINOPHEN 5-325 MG PO TABS: 5-325 MG | ORAL | Qty: 1

## 2022-06-04 NOTE — Progress Notes (Signed)
[facility], [place] [address]  [phone]        Hospitalist Progress Note      NAME: Meghan [name]   DOB:  March 20, 1946  MRM:  [id number]    Date/Time of service: 06/04/2022  12:12 PM       Subjective:     Chief Complaint:  Patient was personally seen and examined by me during this time period.  Chart reviewed.  Resting.  No complaints        Objective:       Vitals:       Last 24hrs VS reviewed since prior progress note. Most recent are:    Vitals:    06/04/22 1045   BP: (!) 111/50   Pulse: 87   Resp: 16   Temp: 98.6 F (37 C)   SpO2: 96%     SpO2 Readings from Last 6 Encounters:   06/04/22 96%   03/25/22 94%   03/25/22 99%          Intake/Output Summary (Last 24 hours) at 06/04/2022 1212  Last data filed at 06/04/2022 0730  Gross per 24 hour   Intake 550 ml   Output 200 ml   Net 350 ml          Exam:     Physical Exam:    Gen:  elderly, frail, ill-appearing, NAD  HEENT:  Pink conjunctivae, PERRL, hearing intact to voice, moist mucous membranes  Neck:  Supple, without masses, thyroid non-tender  Resp:  No accessory muscle use, clear breath sounds without wheezes rales or rhonchi  Card:  No murmurs, normal S1, S2 without thrills, bruits or peripheral edema  Abd:  Soft, non-tender, non-distended, normoactive bowel sounds are present  Musc:  No cyanosis or clubbing.  External fixation present   Skin:  blister at fracture site   Neuro:  moves all ext, generalized weakness  Psych:  poor insight, oriented to person    Medications Reviewed: (see below)    Lab Data Reviewed: (see below)    ______________________________________________________________________    Medications:     Current Facility-Administered Medications   Medication Dose Route Frequency    HYDROmorphone (DILAUDID) injection 0.5 mg  0.5 mg IntraVENous Q4H PRN    lisinopril (PRINIVIL;ZESTRIL) tablet 10 mg  10 mg Oral Daily    acidophilus probiotic capsule 1 capsule  1 capsule Oral Daily     divalproex (DEPAKOTE) DR tablet 500 mg  500 mg Oral 2 times per day    lacosamide (VIMPAT) tablet 100 mg  100 mg Oral BID    sodium chloride flush 0.9 % injection 5-40 mL  5-40 mL IntraVENous 2 times per day    sodium chloride flush 0.9 % injection 5-40 mL  5-40 mL IntraVENous PRN    0.9 % sodium chloride infusion   IntraVENous PRN    ondansetron (ZOFRAN-ODT) disintegrating tablet 4 mg  4 mg Oral Q8H PRN    Or    ondansetron (ZOFRAN) injection 4 mg  4 mg IntraVENous Q6H PRN    enoxaparin Sodium (LOVENOX) injection 30 mg  30 mg SubCUTAneous BID    HYDROcodone-acetaminophen (NORCO) 5-325 MG per tablet 1 tablet  1 tablet Oral Q4H PRN    hydrALAZINE (APRESOLINE) injection 10 mg  10 mg IntraVENous Q6H PRN    bupivacaine (PF) (MARCAINE) 0.5 % injection 150 mg  30 mL IntraDERmal Once    iopamidol (ISOVUE-370) 76 % injection 100 mL  100 mL IntraVENous ONCE PRN    glucose chewable tablet 16 g  4 tablet Oral PRN    dextrose bolus 10% 125 mL  125 mL IntraVENous PRN    Or    dextrose bolus 10% 250 mL  250 mL IntraVENous PRN    dextrose 10 % infusion   IntraVENous Continuous PRN    naloxone (NARCAN) injection 0.4 mg  0.4 mg IntraVENous PRN    sodium chloride flush 0.9 % injection 5-40 mL  5-40 mL IntraVENous 2 times per day    0.9 % sodium chloride infusion   IntraVENous PRN    polyethylene glycol (GLYCOLAX) packet 17 g  17 g Oral Daily PRN    acetaminophen (TYLENOL) tablet 650 mg  650 mg Oral Q6H PRN    Or    acetaminophen (TYLENOL) suppository 650 mg  650 mg Rectal Q6H PRN    dextrose bolus 10% 125 mL  125 mL IntraVENous PRN    Or    dextrose bolus 10% 250 mL  250 mL IntraVENous PRN    glucagon injection 1 mg  1 mg SubCUTAneous PRN    dextrose 10 % infusion   IntraVENous Continuous PRN    [Held by provider] aspirin chewable tablet 81 mg  81 mg Oral Daily    citalopram (CELEXA) tablet 10 mg  10 mg Oral Daily    donepezil (ARICEPT) tablet 10 mg  10 mg Oral Nightly    memantine (NAMENDA) tablet 10 mg  10 mg Oral BID    rOPINIRole  (REQUIP) tablet 1 mg  1 mg Oral Nightly          Lab Review:     Recent Labs     06/02/22  0526   WBC 7.8   HGB 11.4*   HCT 34.9*   PLT 651*       Recent Labs     06/02/22  0526   NA 139   K 3.6   CL 103   CO2 31   BUN 14   MG 1.9   PHOS 2.7       No results found for: "GLUCPOC"       Assessment / Plan:     76 yo hx of HTN, Parkinson's dementia, seizure d/o, presented w/ AMS, hypoglycemia, hypotension.  Hospital course complicated by fall, R ankle fracture s/p external fixation 10/16    1) R ankle fracture/pain: due to GLF.  S/p external fixation by Dr. Dema Severin on 10/16.  S/p bedside debridement of blister on 10/20.  Will cont IV dilaudid prn severe pain.  Cont PT/OT.  Plan for ORIF next week.      2) Acute met encephalopathy: now stable.  Likely from hypoglycemia, hypotension.  EEG neg for seizures.  Head MRI unremarkable.  Will monitor for agitation    3) Hypotension/hypoglycemia: now resolved.  ACTH stim test neg for adrenal insuff.  Endocrine was following    4) AKI: resolved with IVF    5) HTN: BP stable.  Restart low dose lisinopril.  Use IV hydralazine prn    6) DM type 2 w/ diabetic neuropathy: A1C 5.6%.  Off insulin due to hypoglycemia.  Diet control     7) Thrombocytopenia: likely due to fracture.  Will monitor     8) Parkinson's dementia/depression seems to be at baseline.  Cont Namenda, Aricept, celexa     9) Seizure d/o: EEG neg.  Cont Vimpat, depakote    **Prior records, notes, labs, radiology, and medications  reviewed in [place]**    Total time spent with patient care: 30 Minutes **I personally saw and examined the patient during this time period**                 Care Plan discussed with: Patient, nursing    Discussed:  Care Plan    Prophylaxis:  Lovenox    Disposition:  SNF/LTC           ___________________________________________________    Attending Physician: Pura Spice, MD

## 2022-06-04 NOTE — Care Coordination-Inpatient (Signed)
Transition of Care Plan:    RUR: 15%  Prior Level of Functioning: assistance with mobility,shopping,cooking,housekeeping  Disposition: eventual discharge to [facility] for rehab    Plan:ORIF this week    Weston Anna

## 2022-06-04 NOTE — Progress Notes (Signed)
Orthopaedic Progress Note  Post Op day: 6 Days Post-Op    June 04, 2022 9:44 PM     Patient: Meghan [name] MRN: [id number]  SSN: [id number]    Date of Birth: 1945-08-20  Age: 76 y.o.  Sex: female      Admit date:  05/23/2022  Procedures:  Procedure(s):  ANKLE EXTERNAL FIXATOR APPLICATION RIGHT  Admitting Physician:  Horald Chestnut, DO   Surgeon:  Moishe Spice) and Role:     * Paulino Rily., MD - Primary    Consulting Physician(s): Treatment Team: Attending Provider: Dayna Barker, MD; Consulting Physician: Catha Brow, MD; Case Manager: Rebbeca Paul; Consulting Physician: Lyman Speller, MD; Consulting Physician: Gus Puma, APRN - NP; Consulting Physician: Carlisle Cater, MD; Surgeon: Paulino Rily., MD; Registered Nurse: Reglos, Almon Hercules, RN; Tech: Melonie Florida    SUBJECTIVE:     Meghan [name] is a 76 y.o. female is 6 Days Post-Op s/p Procedure(s):  ANKLE EXTERNAL FIXATOR APPLICATION RIGHT with an appropriate level of post-operative pain.  She is awake.  Not appropriately oriented.  Can not obtain ROS.   OBJECTIVE:       Physical Exam:  General: Alert, cooperative, no distress.    Respiratory: Respirations unlabored  Neurological:  Neurovascular exam within normal limits.  Motor: + DF/PF.   Musculoskeletal: Calves soft, supple, non-tender upon palpation.    Dressing/Wound:  External fixator device in place RLE.  There is serous drainage on the dressings.  There is a large blister from the medial to lateral to posterior lower leg at the level of the tibiotalar joint.  I decompressed the remainder of the blister.  There is a small blister over the lateral ankle proximal to the lateral malleolus.  SILT in the foot.  Palpable DP and PT pulses.  BCR of all digits.                 Vital Signs:      Patient Vitals for the past 8 hrs:   BP Temp Temp src Pulse Resp SpO2   06/04/22 2024 119/66 99 F (37.2 C) Oral 86 17 98 %                                          Temp (24hrs),  Avg:98.7 F (37.1 C), Min:98.3 F (36.8 C), Max:99 F (37.2 C)      Labs:        Recent Labs     06/02/22  0526   HCT 34.9*   HGB 11.4*     Lab Results   Component Value Date/Time    NA 139 06/02/2022 05:26 AM    K 3.6 06/02/2022 05:26 AM    CL 103 06/02/2022 05:26 AM    CO2 31 06/02/2022 05:26 AM    BUN 14 06/02/2022 05:26 AM       PT/OT:                Patient mobility                         ASSESSMENT / PLAN:   Principal Problem:    Encephalopathy  Active Problems:    AKI (acute kidney injury) (HCC)    Hypoglycemia    Stroke (cerebrum) (HCC)    Complex partial seizure evolving to generalized seizure (HCC)  Dementia without behavioral disturbance (HCC)    HLD (hyperlipidemia)    Type 2 diabetes mellitus with diabetic neuropathy (HCC)    Closed fracture dislocation of ankle joint, right, initial encounter  Resolved Problems:    * No resolved hospital problems. *            A: 1. S/P closed reduction and application of external fixator, right ankle POD 6    P: 1. Wound care:  Does not appear wound care was completed since last blister was drained.  Wound care done by our service today.  Remainder of blister decompressed today.  A small blister starting over the lateral ankle was dressed today.  Needs daily wound care to the ankle- I applied acticoat and xeroform to the blisters.    2. Right ankle trimalleolar fracture: Continue with current restrictions of the right ankle- NWB.  Strict elevation.  She will not likely have removal of ex fix and ORIF done this week due to skin integrity.  I suspect she will need at least another week before definitive management of her fracture.   3. DISPO: SNF  4. DVT ppx: Lovenox 30 mg SC BID.  5. Orthopedics to follow.       Signed By:  Dorthey Sawyer, [place]

## 2022-06-04 NOTE — Plan of Care (Signed)
Problem: Skin/Tissue Integrity  Goal: Absence of new skin breakdown  Description: 1.  Monitor for areas of redness and/or skin breakdown  2.  Assess vascular access sites hourly  3.  Every 4-6 hours minimum:  Change oxygen saturation probe site  4.  Every 4-6 hours:  If on nasal continuous positive airway pressure, respiratory therapy assess nares and determine need for appliance change or resting period.  Outcome: Progressing     Problem: Safety - Adult  Goal: Free from fall injury  Outcome: Progressing     Problem: Chronic Conditions and Co-morbidities  Goal: Patient's chronic conditions and co-morbidity symptoms are monitored and maintained or improved  Outcome: Progressing  Flowsheets (Taken 06/04/2022 2024)  Care Plan - Patient's Chronic Conditions and Co-Morbidity Symptoms are Monitored and Maintained or Improved: Monitor and assess patient's chronic conditions and comorbid symptoms for stability, deterioration, or improvement     Problem: Discharge Planning  Goal: Discharge to home or other facility with appropriate resources  Outcome: Progressing  Flowsheets (Taken 06/04/2022 2024)  Discharge to home or other facility with appropriate resources: Identify barriers to discharge with patient and caregiver     Problem: Neurosensory - Adult  Goal: Achieves stable or improved neurological status  Outcome: Progressing  Flowsheets (Taken 06/04/2022 2024)  Achieves stable or improved neurological status: Assess for and report changes in neurological status     Problem: Respiratory - Adult  Goal: Achieves optimal ventilation and oxygenation  Outcome: Progressing     Problem: Cardiovascular - Adult  Goal: Maintains optimal cardiac output and hemodynamic stability  Outcome: Progressing  Flowsheets (Taken 06/04/2022 2024)  Maintains optimal cardiac output and hemodynamic stability: Monitor blood pressure and heart rate     Problem: Skin/Tissue Integrity - Adult  Goal: Skin integrity remains intact  Outcome:  Progressing  Flowsheets  Taken 06/04/2022 2323  Skin Integrity Remains Intact: Monitor for areas of redness and/or skin breakdown  Taken 06/04/2022 2024  Skin Integrity Remains Intact: Monitor for areas of redness and/or skin breakdown     Problem: Musculoskeletal - Adult  Goal: Return mobility to safest level of function  Outcome: Progressing  Flowsheets (Taken 06/04/2022 2024)  Return Mobility to Safest Level of Function: Assess patient stability and activity tolerance for standing, transferring and ambulating with or without assistive devices     Problem: Gastrointestinal - Adult  Goal: Minimal or absence of nausea and vomiting  Outcome: Progressing  Flowsheets (Taken 06/04/2022 2024)  Minimal or absence of nausea and vomiting: Administer IV fluids as ordered to ensure adequate hydration     Problem: Genitourinary - Adult  Goal: Absence of urinary retention  Outcome: Progressing  Flowsheets (Taken 06/04/2022 2024)  Absence of urinary retention: Assess patient's ability to void and empty bladder     Problem: Infection - Adult  Goal: Absence of infection at discharge  Outcome: Progressing  Flowsheets (Taken 06/04/2022 2024)  Absence of infection at discharge: Assess and monitor for signs and symptoms of infection     Problem: Metabolic/Fluid and Electrolytes - Adult  Goal: Electrolytes maintained within normal limits  Outcome: Progressing  Flowsheets (Taken 06/04/2022 2024)  Electrolytes maintained within normal limits: Monitor labs and assess patient for signs and symptoms of electrolyte imbalances     Problem: Hematologic - Adult  Goal: Maintains hematologic stability  Outcome: Progressing  Flowsheets (Taken 06/04/2022 2024)  Maintains hematologic stability: Assess for signs and symptoms of bleeding or hemorrhage     Problem: Pain  Goal: Verbalizes/displays adequate comfort level or  baseline comfort level  Outcome: Progressing     Problem: Nutrition Deficit:  Goal: Optimize nutritional status  Outcome:  Progressing     Problem: ABCDS Injury Assessment  Goal: Absence of physical injury  Outcome: Progressing

## 2022-06-05 LAB — BASIC METABOLIC PANEL
Anion Gap: 4 mmol/L — ABNORMAL LOW (ref 5–15)
BUN: 18 MG/DL (ref 6–20)
Bun/Cre Ratio: 22 — ABNORMAL HIGH (ref 12–20)
CO2: 29 mmol/L (ref 21–32)
Calcium: 9 MG/DL (ref 8.5–10.1)
Chloride: 110 mmol/L — ABNORMAL HIGH (ref 97–108)
Creatinine: 0.82 MG/DL (ref 0.55–1.02)
Est, Glom Filt Rate: >60 mL/min/{1.73_m2} (ref >60)
Glucose: 81 mg/dL (ref 65–100)
Potassium: 3.9 mmol/L (ref 3.5–5.1)
Sodium: 143 mmol/L (ref 136–145)

## 2022-06-05 LAB — IRON AND TIBC
Iron % Saturation: 13 % — ABNORMAL LOW (ref 20–50)
Iron: 40 ug/dL (ref 35–150)
TIBC: 301 ug/dL (ref 250–450)

## 2022-06-05 LAB — CBC
Hematocrit: 35.5 % (ref 35.0–47.0)
Hemoglobin: 11.2 g/dL — ABNORMAL LOW (ref 11.5–16.0)
MCH: 29.4 PG (ref 26.0–34.0)
MCHC: 31.5 g/dL (ref 30.0–36.5)
MCV: 93.2 FL (ref 80.0–99.0)
MPV: 10.2 FL (ref 8.9–12.9)
Nucleated RBCs: 0 PER 100 WBC
Platelets: 832 10*3/uL — ABNORMAL HIGH (ref 150–400)
RBC: 3.81 M/uL (ref 3.80–5.20)
RDW: 15.6 % — ABNORMAL HIGH (ref 11.5–14.5)
WBC: 10.4 10*3/uL (ref 3.6–11.0)
nRBC: 0 10*3/uL (ref 0.00–0.01)

## 2022-06-05 LAB — FERRITIN: Ferritin: 51 NG/ML (ref 8–252)

## 2022-06-05 LAB — MAGNESIUM: Magnesium: 1.9 mg/dL (ref 1.6–2.4)

## 2022-06-05 LAB — PHOSPHORUS: Phosphorus: 3.2 MG/DL (ref 2.6–4.7)

## 2022-06-05 MED ORDER — ENOXAPARIN SODIUM 30 MG/0.3ML IJ SOSY
30 MG/0.3ML | Freq: Two times a day (BID) | INTRAMUSCULAR | 0 refills | Status: DC
Start: 2022-06-05 — End: 2022-06-16

## 2022-06-05 MED ORDER — SENNA-DOCUSATE SODIUM 8.6-50 MG PO TABS
ORAL_TABLET | Freq: Every day | ORAL | 0 refills | Status: AC
Start: 2022-06-05 — End: ?

## 2022-06-05 MED ORDER — HYDROCODONE-ACETAMINOPHEN 5-325 MG PO TABS
5-325 MG | ORAL_TABLET | ORAL | 0 refills | Status: AC | PRN
Start: 2022-06-05 — End: 2022-06-10

## 2022-06-05 MED ORDER — DIVALPROEX SODIUM 500 MG PO TBEC
500 MG | ORAL_TABLET | Freq: Two times a day (BID) | ORAL | 0 refills | Status: AC
Start: 2022-06-05 — End: ?

## 2022-06-05 MED FILL — HYDROCODONE-ACETAMINOPHEN 5-325 MG PO TABS: 5-325 MG | ORAL | Qty: 1

## 2022-06-05 NOTE — Care Coordination-Inpatient (Signed)
CM notified of Pt discharging today. CM contacted [facility] to confirm admission for today. CM spoke with [facility] liaison Albina Billet, facility has concerns regarding anticipated ORIF procedure as they would like for Pt to undergo full therapy course without interruptions.       CM was informed that if there is a chance that the Pt will have the ORIF within the next week, they would prefer that Pt undergo the procedure prior to coming to [facility].     CM notified attending of this.       Florene Glen, [place], [place]  [phone]

## 2022-06-05 NOTE — Discharge Instructions (Addendum)
HOSPITALIST DISCHARGE INSTRUCTIONS  NAME:  Meghan [name]   DOB:  1946-02-09   MRN:  [id number]     Date/Time:  06/16/2022 1:15 PM    ADMIT DATE: 05/23/2022     DISCHARGE DATE: 06/16/2022     DISCHARGE DIAGNOSIS:  Hypoglycemia; ankle fracture    DISCHARGE INSTRUCTIONS:  Thank you for allowing [place] to participate in your care. Your discharging Hospitalist is Lyn Hollingshead, MD. Dennis Bast were admitted for evaluation and treatment of the above.       MEDICATIONS:    It is important that you take the medication exactly as they are prescribed.   Keep your medication in the bottles provided by the pharmacist and keep a list of the medication names, dosages, and times to be taken in your wallet.   Do not take other medications without consulting your doctor.             If you experience any of the following symptoms then please call your primary care physician or return to the emergency room if you cannot get hold of your doctor:  Fever, chills, nausea, vomiting, diarrhea, change in mentation, falling, bleeding, shortness of breath    Follow Up:  Please call the below provider to arrange hospital follow up appointment      [place] office Monday 06/19/2022 at [facility] for PICO dressing change.       For questions regarding your Hospitalization or to contact the Hospital Medicine team, please call [phone].      Information obtained by :  I understand that if any problems occur once I am at home I am to contact my physician.    I understand and acknowledge receipt of the instructions indicated above.                                                                                                                                           Physician's or R.N.'s Signature                                                                  Date/Time  Patient or Biochemist, clinical

## 2022-06-05 NOTE — Plan of Care (Signed)
Problem: Physical Therapy - Adult  Goal: By Discharge: Performs mobility at highest level of function for planned discharge setting.  See evaluation for individualized goals.  Description: Physical Therapy Goals  Revised 05/31/2022  1.  Patient will move from supine to sit and sit to supine  in bed with minimal assistance/contact guard assist within 7 day(s).  2.  Patient will do lateral transfer from bed to chair and chair to bed with minimal assistance/contact guard assist using the least restrictive device within 7 day(s).  3.  Patient will perform sit to stand with minimal assistance/contact guard assist within 7 day(s).  4.  Patient will ambulate with minimal assistance/contact guard assist for 5x2 feet with the least restrictive device within 7 day(s).        FUNCTIONAL STATUS PRIOR TO ADMISSION: Patient was modified independent using a rolling walker for functional mobility. The patient lives in [place] and is in town visiting her sister who lives locally.     HOME SUPPORT PRIOR TO ADMISSION: The patient lived with her significant other in [place] but did not require assistance. She reports 2 falls in the past month.     Physical Therapy Goals  Initiated 05/24/2022  1.  Patient will move from supine to sit and sit to supine, scoot up and down, and roll side to side in bed with independence within 7 day(s).    2.  Patient will perform sit to stand with contact guard assist within 7 day(s).  3.  Patient will transfer from bed to chair and chair to bed with supervision/set-up using the least restrictive device within 7 day(s).  4.  Patient will ambulate with supervision/set-up for 50 feet with the least restrictive device within 7 day(s).   5.  Patient will ascend/descend 4 stairs with 1-2 handrail(s) with contact guard assist within 7 day(s).    Outcome: Progressing   PHYSICAL THERAPY TREATMENT    Patient: Meghan [name] (76 y.o. female)  Date: 06/05/2022  Diagnosis: Encephalopathy [G93.40]  Acute encephalopathy  [G93.40]  Closed head injury, initial encounter [S09.90XA]  Unwitnessed fall [R29.6] Encephalopathy  Procedure(s) (LRB):  ANKLE EXTERNAL FIXATOR APPLICATION RIGHT (Right) 7 Days Post-Op  Precautions: Weight Bearing, Fall Risk (NWB RLE) Right Lower Extremity Weight Bearing: Non Weight Bearing                  ASSESSMENT:  Patient continues to benefit from skilled PT services and is slowly progressing towards goals. Pt limited by impaired cognition, severely HOH, NWB on RLE difficultly with motor planning and sequencing sit>stand with RW requiring Max A x 2 and unable to maintain WB restrictions. CGA/Min A for lateral scooting toward HOB.          PLAN:  Patient continues to benefit from skilled intervention to address the above impairments.  Continue treatment per established plan of care.    Recommendation for discharge: (in order for the patient to meet his/her long term goals): Therapy up to 5 days/week in rehab facility    Other factors to consider for discharge: impaired cognition, high risk for falls, concern for safely navigating or managing the home environment, and new weight bearing restrictions limiting activity or patient is unable to maintain    IF patient discharges home will need the following DME: continuing to assess with progress       SUBJECTIVE:   Patient stated, "I can't put my foot down."    OBJECTIVE DATA SUMMARY:   Critical Behavior:  Functional Mobility Training:  Bed Mobility:  Bed Mobility Training  Supine to Sit: Contact-guard assistance  Sit to Supine: Contact-guard assistance  Scooting: Minimum assistance  Transfers:     Balance:  Balance  Sitting: Intact  Sitting - Static: Good (unsupported)  Sitting - Dynamic: Fair (occasional)  Standing: Impaired   Ambulation/Gait Training:        Neuro Re-Education:                    Pain Rating:  3/10   Pain Intervention(s):   rest and elevation    Activity Tolerance:   Good    After treatment:   Patient left in no apparent distress in bed,  Call bell within reach, and Bed/ chair alarm activated      COMMUNICATION/EDUCATION:   The patient's plan of care was discussed with: registered nurse    Patient Education  Education Provided: Home Exercise Program;Role of Therapy;Precautions  Education Method: Verbal  Barriers to Learning: Hearing;Cognition  Education Outcome: Continued education needed      Verl Dicker, PTA  Minutes: 24

## 2022-06-05 NOTE — Plan of Care (Cosign Needed)
Problem: Occupational Therapy - Adult  Goal: By Discharge: Performs self-care activities at highest level of function for planned discharge setting.  See evaluation for individualized goals.  Description: FUNCTIONAL STATUS PRIOR TO ADMISSION:  Pt is independent with ADLs and mobility.      HOME SUPPORT: Patient is a poor historian, reporting she is visiting from out of town ([place]) and is staying with her sister here in [place]. Per chart she resides in a Group Home.    Occupational Therapy Goals:  Re-Evaluation 05/31/2022 - now RLE NWB s/p R ankle external fixation (sustained R ankle fracture during GLF this admission)  1.  Patient will perform seated grooming with Supervision/Set-up within 7 day(s).  2.  Patient will perform lateral transfer to drop-arm chair/BSC with Minimal Assist x1 in prep for OOB ADL engagementwithin 7 day(s).  3.  Patient will perform seated/standing bathing with Moderate Assist within 7 day(s).  4.  Patient will perform stand-pivot toilet transfers with Moderate Assist and Assist x2 using RW within 7 day(s).  5.  Patient will perform all aspects of toileting with Minimal Assist within 7 day(s).  6.  Patient will participate in upper extremity therapeutic exercise/activities with Supervision for 10 minutes within 7 day(s).    7.  Patient will utilize energy conservation and fall prevention techniques during functional activities with verbal cues within 7 day(s).    Initiated 05/24/2022  1.  Patient will perform grooming, standing at sink, with Modified Independence within 7 day(s).  2.  Patient will perform lower body dressing with Modified Independence within 7 day(s).  3.  Patient will perform bathing, sitting and standing PRN, with Supervision within 7 day(s).  4.  Patient will perform toilet transfers with Modified Independence  within 7 day(s).  5.  Patient will perform all aspects of toileting with Modified Independence within 7 day(s).  6.  Patient will participate in upper  extremity therapeutic exercise/activities with Independence for 10 minutes within 7 day(s).    Outcome: Progressing   OCCUPATIONAL THERAPY TREATMENT  Patient: Meghan [name] (76 y.o. female)  Date: 06/05/2022  Primary Diagnosis: Encephalopathy [G93.40]  Acute encephalopathy [G93.40]  Closed head injury, initial encounter [S09.90XA]  Unwitnessed fall [R29.6]  Procedure(s) (LRB):  ANKLE EXTERNAL FIXATOR APPLICATION RIGHT (Right) 7 Days Post-Op   Precautions: Weight Bearing, Fall Risk (NWB RLE) Right Lower Extremity Weight Bearing: Non Weight Bearing              Chart, occupational therapy assessment, plan of care, and goals were reviewed.    ASSESSMENT  Patient continues to benefit from skilled OT services and is slowly progressing towards goals. Pt limited by impaired cognition, severely HOH, NWB on RLE difficultly with motor planning and sequencing ADL's or sit functional mobility. Pt needed  Max A x 2 and unable to maintain WB restrictions. CGA/Min A for lateral scooting toward HOB. She washed her face and hands with contact guard and decreased safety awareness, problem solving.              PLAN :  Patient continues to benefit from skilled intervention to address the above impairments.  Continue treatment per established plan of care to address goals.    Recommend with staff: Adl's, there ex, there act    Recommend next OT session: cont towards goals    Recommendation for discharge: (in order for the patient to meet his/her long term goals): Therapy up to 5 days/week in Skilled nursing facility    Other factors to  consider for discharge: severely impaired cognition    IF patient discharges home will need the following DME:        SUBJECTIVE:   Patient stated "I can't stand."    OBJECTIVE DATA SUMMARY:   Cognitive/Behavioral Status:  Orientation  Overall Orientation Status: Within Normal Limits  Orientation Level: Oriented to person;Disoriented to situation;Disoriented to time  Cognition  Overall Cognitive  Status: Exceptions  Arousal/Alertness: Delayed responses to stimuli  Following Commands: Follows one step commands with repetition  Attention Span: Attends with cues to redirect  Memory: Decreased recall of precautions  Safety Judgement: Decreased awareness of need for assistance;Decreased awareness of need for safety    Functional Mobility and Transfers for ADLs:  Bed Mobility:  Bed Mobility Training  Supine to Sit: Contact-guard assistance  Sit to Supine: Contact-guard assistance  Scooting: Minimum assistance     Transfers:      Not tested    Balance:  Standing: Impaired  Balance  Sitting: Intact  Sitting - Static: Good (unsupported)  Sitting - Dynamic: Fair (occasional)  Standing: Impaired      ADL Intervention:                   Grooming: Contact guard assistance   Grooming Skilled Clinical Factors: to wash her face and hands seated edge of bed           Activity Tolerance:   Poor  Please refer to the flowsheet for vital signs taken during this treatment.    After treatment:   Patient left in no apparent distress in bed and Call bell within reach    COMMUNICATION/EDUCATION:   The patient's plan of care was discussed with: physical therapy assistant, occupational therapist, and registered nurse    Patient Education  Education Given To: Patient  Education Provided: Role of Therapy  Education Method: Verbal  Barriers to Learning: Cognition;Hearing  Education Outcome: Continued education needed    Thank you for this referral.  Wells Guiles A. Kelise Kuch, COTA/L  Minutes: 25

## 2022-06-05 NOTE — Progress Notes (Signed)
Orthopedic NP Progress Note  Post Op Day: 7 Days Post-Op    June 05, 2022 12:19 PM     [name]    Attending Physician: Treatment Team: Attending Provider: Brynda Greathouse, MD; Consulting Physician: Emelda Brothers, MD; Case Manager: Justice Deeds; Consulting Physician: Cathlyn Parsons, MD; Consulting Physician: Leron Croak, APRN - NP; Consulting Physician: Kathy Breach, MD; Surgeon: Beola Cord., MD; Registered Nurse: Jolene Provost, RN; Tech: Leeanne Rio; Physical Therapist Assistant: Verl Dicker, PTA; Occupational Therapist Assistant: Claris Gladden Charge Nurse: Jamse Arn, RN     Vital Signs:    Patient Vitals for the past 8 hrs:   BP Temp Temp src Pulse Resp SpO2   06/05/22 1158 105/68 98.2 F (36.8 C) Oral 91 18 96 %   06/05/22 0741 (!) 178/97 98.6 F (37 C) Oral 93 16 99 %          Intake/Output:  No intake/output data recorded.  10/21 1901 - 10/23 0700  In: 925 [P.O.:925]  Out: 350 [Urine:350]    Pain Control:        LAB:    Recent Labs     06/05/22  0230   HCT 35.5   HGB 11.2*     Lab Results   Component Value Date/Time    NA 143 06/05/2022 02:30 AM    K 3.9 06/05/2022 02:30 AM    CL 110 06/05/2022 02:30 AM    CO2 29 06/05/2022 02:30 AM    BUN 18 06/05/2022 02:30 AM       Subjective:  Meghan [name] is a 76 y.o. female s/p a  Procedure(s):  ANKLE EXTERNAL FIXATOR APPLICATION RIGHT   Procedure(s):  ANKLE EXTERNAL FIXATOR APPLICATION RIGHT. Tolerating diet. Pt awake and alert today, no pain currently        Objective:   General: alert, cooperative, no distress.    Neuro/Vascular: CNS Intact.  Sensation stable. Brisk cap refill, + pulses UE/LE  Musculoskeletal:   RLE - ex fix to ankle, able to wiggle toes, NVI   (see wound RN pics of skin)   Skin: warm and dry   Dressing - clean, dry and intact.          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 *       Plan:   -  -  R trimal fracture s/p Ex Fix placement - pt was seen by wound care this AM with plan for wound care MWF with Saline moistened hydrofera blue to open blister, Dry gauze/ABD/kerlex/ace, continue daily pin care with saline and xeroform around pin sites and leave the pin care sites accessible when wrapping blisters  - NWB RLE with PT/OT, ice and elevate ono 2-3 pillows at all times,  pain management as  per orders   - Lovenox daily for the next 30 days   - discussed exam with Dr. Cliffton Asters pt may go to rehab today with plan to follow up in office on Wednesday        Dr. Cliffton Asters aware and agree with plan.     Signed By: Luciano Cutter, APRN - NP    Orthopedic Nurse Practitioner

## 2022-06-05 NOTE — Wound Image (Signed)
Wound Consult:  New Patient Visit. Chart reviewed.  Consulted for fracture blisters right foot/ankle area.  Spoke with patients nurse,  Lilia Pro.  Patient is resting on a [place] bed; bed alarm on.   Patient is alert, oriented to self, place; requires some assist to move side to side in bed with external fixator in place.  Braden score 18.  Assessment:  Right foot/ankle - nearly circumferential blister with moist bright pink blanching partial thickness base, sensate, drainage is serosanguinous; no odor. No surrounding redness. Clean pin sites with Xeroform gauze.    Gluteal cleft with hypopigmentation, no open areas. No redness to buttocks/sacrum.  Treatment:  Saline moistened hydrofera blue placed to open blister. Dry gauze/ABD/kerlex/ace.  Pin sites above and below this dressing are accessible to staff to perform daily pin site care.  Wound Recommendations:  Saline moistened hydrofera blue to open blister. Dry gauze/ABD/kerlex/ace. MWF.  Pin sites above and below this dressing are accessible to staff to perform daily pin site care. The areas under dressing can be done MWF.  Hand off to [facility] and we reviewed care and dressing / pin sites at bedside.  Updated orders with consultation with Ortho providers.  Skin Care / PI Prevention Recommendations:  1. Minimize friction/shear: minimize layers of linen/pads under patient.   2. Off load pressure/reposition: Turn and reposition approximately every 2 hours. Float heels with pillows.  3. Manage Moisture - Keep skin folds dry. Intentional toileting. Incontinence skin care with incontinence wipes. Use barrier ointment if needed.  Appropriate sized briefs in use.  Purewick in use to help contain urine.   4. Continue to monitor nutrition, pain, and skin risk scale, and skin assessment.  Plan:  Hand off to Gladstone Lighter NP regarding findings and proposed orders for treatment.  We will continue to reassess routinely and as needed.  Please re-consult should concerns arise despite  continued skin/PI prevention measures.    [facility], Wound / [place] Office [phone]

## 2022-06-05 NOTE — Progress Notes (Signed)
Comprehensive Nutrition Assessment    Type and Reason for Visit:  Reassess    Nutrition Recommendations/Plan:   Continue regular diet   Provide Juven BID to aid in wound healing (95 kcal, 9.8 g carbs, complete amino acids for wound healing 2.5 g)  Please document all BMs     Malnutrition Assessment:  Malnutrition Status:  Insufficient data (06/05/22 1320)  - declines NFPE  Context:  Acute Illness     Findings of the 6 clinical characteristics of malnutrition:  Energy Intake:  No significant decrease in energy intake  Weight Loss:  No significant weight loss     Body Fat Loss:  Unable to assess     Muscle Mass Loss:  Unable to assess    Fluid Accumulation:  No significant fluid accumulation    Grip Strength:  Not Performed    Nutrition Assessment:     10/23: Follow up. Good PO intake continues, averaging 75% or more of all meals. Patient POD7 external fixator application for R ankle. RD assisted patient in tray set up for meal. Had ONS bedside but reported she dislikes these. Voiced acceptance of Juven supplement twice daily. Denies nausea/vomiting/diarrhea. No pain at this time. When asked if she would like consistent meal assistance, patient declines. Per IDR discussion, may discharge this week.  Still does not allow RD to perform physical exam. Documentation of last BM assumed incorrect; previously had BM documented 10/14 but today's notation shows 10/9. Patient cannot remember date of last BM.    Meal Intake:   Patient Vitals for the past 168 hrs:   PO Meals Eaten (%)   06/05/22 0832 51 - 75%   06/04/22 1311 51 - 75%   06/04/22 0730 76 - 100%   06/03/22 1400 51 - 75%   06/03/22 0835 76 - 100%   06/02/22 1515 76 - 100%   06/02/22 0808 76 - 100%   06/01/22 0800 76 - 100%   05/31/22 1241 26 - 50%   05/31/22 0922 76 - 100%   05/30/22 1513 26 - 50%   05/30/22 1509 0%   05/30/22 0843 26 - 50%     Supplement Intake:  No data found.    Nutrition Related Findings:      Wound Type:  (broken R ankle)     Last BM:  05/22/22  Edema: Left lower extremity, Right lower extremity      RUE Edema: None  LUE Edema: None  RLE Edema: Trace  LLE Edema: Trace    Nutr. Labs:    Lab Results   Component Value Date    CREATININE 0.82 06/05/2022    BUN 18 06/05/2022    NA 143 06/05/2022    K 3.9 06/05/2022    CL 110 (H) 06/05/2022    CO2 29 06/05/2022       Lab Results   Component Value Date/Time    POCGLU 86 05/29/2022 07:36 PM    POCGLU 93 05/29/2022 12:52 AM    POCGLU 157 05/28/2022 08:03 PM    POCGLU 107 05/28/2022 02:02 PM    POCGLU 70 05/28/2022 12:06 PM    POCGLU 90 05/28/2022 05:47 AM        Hemoglobin A1C   Date Value Ref Range Status   06/02/2022 5.6 4.0 - 5.6 % Final     Comment:     (NOTE)  HbA1C Interpretive Ranges  <5.7              Normal  5.7 -  6.4         Consider Prediabetes  >6.5              Consider Diabetes       Hemoglobin A1C, POC   Date Value Ref Range Status   01/08/2018 5.4 % Final       Lab Results   Component Value Date/Time    MG 1.9 06/05/2022 02:30 AM       Lab Results   Component Value Date    CALCIUM 9.0 06/05/2022    PHOS 3.2 06/05/2022        Lab Results   Component Value Date    TRIG 90 02/25/2018       Nutr. Meds:  Scheduled Meds:   lisinopril  10 mg Oral Daily    acidophilus probiotic  1 capsule Oral Daily    divalproex  500 mg Oral 2 times per day    lacosamide  100 mg Oral BID    sodium chloride flush  5-40 mL IntraVENous 2 times per day    enoxaparin  30 mg SubCUTAneous BID    bupivacaine (PF)  30 mL IntraDERmal Once    sodium chloride flush  5-40 mL IntraVENous 2 times per day    aspirin  81 mg Oral Daily    citalopram  10 mg Oral Daily    donepezil  10 mg Oral Nightly    memantine  10 mg Oral BID    rOPINIRole  1 mg Oral Nightly     Continuous Infusions:   sodium chloride      dextrose      sodium chloride      dextrose       PRN Meds: HYDROmorphone, sodium chloride flush, sodium chloride, ondansetron **OR** ondansetron, HYDROcodone 5 mg - acetaminophen, hydrALAZINE, iopamidol, glucose, dextrose  bolus **OR** dextrose bolus, dextrose, naloxone, sodium chloride, polyethylene glycol, acetaminophen **OR** acetaminophen, dextrose bolus **OR** dextrose bolus, glucagon (rDNA), dextrose      Current Nutrition Intake & Therapies:    Average Meal Intake: 76-100%  Average Supplements Intake: None Ordered  ADULT DIET; Regular  ADULT ORAL NUTRITION SUPPLEMENT; Breakfast, Dinner; Wound Healing Oral Supplement    Anthropometric Measures:  Height: 167.6 cm (5' 5.98")  Ideal Body Weight (IBW): 130 lbs (59 kg)       Current Body Weight: 81.2 kg (179 lb), 137.7 % IBW. Weight Source: Bed Scale  Current BMI (kg/m2): 28.9        Weight Adjustment For: No Adjustment                 BMI Categories: Overweight (BMI 25.0-29.9)    Estimated Daily Nutrient Needs:  Energy Requirements Based On: Formula  Weight Used for Energy Requirements: Current  Energy (kcal/day): 1728 (MSJ x 1.3)  Weight Used for Protein Requirements: Current  Protein (g/day): 81-97 (1.0-1.2 g/kg)  Method Used for Fluid Requirements: 1 ml/kcal  Fluid (ml/day): 1728    Nutrition Diagnosis:   Increased nutrient needs related to acute injury/trauma as evidenced by  (R broken ankle)    Nutrition Interventions:   Food and/or Nutrient Delivery: Continue Current Diet, Start Oral Nutrition Supplement  Nutrition Education/Counseling: No recommendation at this time  Coordination of Nutrition Care: Continue to monitor while inpatient, Interdisciplinary Rounds  Plan of Care discussed with: IDR team    Goals:  Previous Goal Met: Progressing toward Goal(s)  Goals: Meet at least 75% of estimated needs, by next RD assessment  Nutrition Monitoring and Evaluation:   Behavioral-Environmental Outcomes: None Identified  Food/Nutrient Intake Outcomes: Food and Nutrient Intake, Supplement Intake  Physical Signs/Symptoms Outcomes: Biochemical Data    Discharge Planning:    Continue Oral Nutrition Supplement     Ola Spurr, RD MS  Contact: Ext: [phone], or via Perfect[employer]

## 2022-06-05 NOTE — Discharge Summary (Signed)
[facility]  [place], [place] [address]  [phone]          Hospitalist Discharge Summary     Patient ID:  Meghan [name]  [id number]  76 y.o.  [birth year]    Admit date: 05/23/2022    Discharge date and time: 06/05/2022 2:55 PM    Admission Diagnoses: Encephalopathy [G93.40]  Acute encephalopathy [G93.40]  Closed head injury, initial encounter [S09.90XA]  Unwitnessed fall [R29.6]    Discharge Diagnoses:  Principal Diagnosis Encephalopathy                                            Principal Problem:    Encephalopathy  Active Problems:    AKI (acute kidney injury) ([facility])    Hypoglycemia    Stroke (cerebrum) ([facility])    Complex partial seizure evolving to generalized seizure ([facility])    Dementia without behavioral disturbance ([facility])    HLD (hyperlipidemia)    Type 2 diabetes mellitus with diabetic neuropathy ([facility])    Closed fracture dislocation of ankle joint, right, initial encounter  Resolved Problems:    * No resolved hospital problems. *         Hospital Course:      76 yo hx of HTN, Parkinson's dementia, seizure d/o, presented w/ AMS, hypoglycemia, hypotension.  Hospital course complicated by fall, R ankle fracture s/p external fixation 10/16     1) R ankle fracture/pain: due to GLF.  S/p external fixation by Dr. Dema Severin on 10/16.  S/p bedside debridement of blister on 10/20.  Cont wound care as directed by Ortho.  Cont lovenox for DVT prophy.  Patient will go to rehab and follow up with Ortho as directed for an ORIF     2) Acute met encephalopathy: now stable.  Likely from hypoglycemia, hypotension.  EEG neg for seizures.  Head MRI unremarkable.  Will monitor for agitation     3) Hypotension/hypoglycemia: now resolved.  ACTH stim test neg for adrenal insuff.  Endocrine was following     4) AKI: resolved with IVF     5) HTN: BP stable.  Cont lisinopril     6) DM type 2 w/ diabetic neuropathy: A1C 5.6%.  Off insulin due to hypoglycemia.  Diet control      7)  Thrombocytosis: likely due to fracture.  No further workup.  Will monitor      8) Parkinson's dementia/depression seems to be at baseline.  Cont Namenda, Aricept, celexa      9) Seizure d/o: EEG neg.  Cont Vimpat, depakote    PCP: Gwenith Spitz, MD     Consults: orthopedic surgery    Significant Diagnostic Studies: External fixation of R ankle     Discharge Exam:  Physical Exam:    Gen:  elderly, frail, ill-appearing, NAD  HEENT:  Pink conjunctivae, PERRL, hearing intact to voice, moist mucous membranes  Neck:  Supple, without masses, thyroid non-tender  Resp:  No accessory muscle use, clear breath sounds without wheezes rales or rhonchi  Card:  No murmurs, normal S1, S2 without thrills, bruits or peripheral edema  Abd:  Soft, non-tender, non-distended, normoactive bowel sounds are present  Musc:  No cyanosis or clubbing.  External fixation present   Skin:  wound at fracture site,  see wound care note   Neuro:  moves all ext,  generalized weakness  Psych:  poor insight, oriented to person    Disposition: rehab   Discharge Condition: Stable    Patient Instructions:   Current Discharge Medication List        START taking these medications    Details   HYDROcodone-acetaminophen (NORCO) 5-325 MG per tablet Take 1 tablet by mouth every 4 hours as needed for Pain for up to 5 days. Max Daily Amount: 6 tablets  Qty: 15 tablet, Refills: 0    Comments: Reduce doses taken as pain becomes manageable  Associated Diagnoses: Closed fracture dislocation of ankle joint, right, initial encounter      enoxaparin Sodium (LOVENOX) 30 MG/0.3ML injection Inject 0.3 mLs into the skin 2 times daily  Qty: 18 mL, Refills: 0      sennosides-docusate sodium (SENOKOT-S) 8.6-50 MG tablet Take 1 tablet by mouth daily  Qty: 30 tablet, Refills: 0           CONTINUE these medications which have CHANGED    Details   divalproex (DEPAKOTE) 500 MG DR tablet Take 1 tablet by mouth every 12 hours  Qty: 60 tablet, Refills: 0           CONTINUE these  medications which have NOT CHANGED    Details   lisinopril (PRINIVIL;ZESTRIL) 10 MG tablet Take 1 tablet by mouth daily      calcium carbonate 600 MG TABS tablet Take 1 tablet by mouth daily      solifenacin (VESICARE) 5 MG tablet Take 1 tablet by mouth daily      lacosamide (VIMPAT) 100 MG TABS tablet Take 1 tablet by mouth 2 times daily for 5 days. Max Daily Amount: 200 mg  Qty: 10 tablet, Refills: 0    Associated Diagnoses: Seizure ([facility])      aspirin 81 MG chewable tablet Take by mouth daily      vitamin D3 (CHOLECALCIFEROL) 125 MCG (5000 UT) TABS tablet Take 1 tablet by mouth daily      citalopram (CELEXA) 20 MG tablet Take 0.5 tablets by mouth daily      donepezil (ARICEPT) 10 MG tablet TAKE 1 TABLET BY MOUTH EVERY NIGHT      memantine (NAMENDA) 10 MG tablet TAKE 1 TABLET BY MOUTH TWICE DAILY      rOPINIRole (REQUIP) 1 MG tablet Take 1 tablet by mouth nightly           STOP taking these medications       fenofibrate (TRICOR) 54 MG tablet Comments:   Reason for Stopping:         fluticasone (FLONASE) 50 MCG/ACT nasal spray Comments:   Reason for Stopping:         folic acid (FOLVITE) 1 MG tablet Comments:   Reason for Stopping:         glucose-vitamin C 4-6 GM-MG CHEW chewable tablet Comments:   Reason for Stopping:         rasagiline (AZILECT) 0.5 MG TABS Comments:   Reason for Stopping:             Activity: see surgical instructions  Diet: regular diet  Wound Care:   1) wound care MWF with Saline moistened hydrofera blue to open blister, Dry gauze/ABD/kerlex/ace, continue daily pin care with saline and xeroform around pin sites and leave the pin care sites accessible when wrapping blisters  2) NWB RLE with PT/OT, ice and elevate ono 2-3 pillows at all times,  pain management as per orders   3) Lovenox  daily for the next 30 days     Follow-up with  [place] Cord., [place]  [address]  [address]  [place] [address]  [phone]    Follow up on 06/07/2022      Gwenith Spitz, MD  [facility]  [place]  [place] [address]  [phone]    Schedule an appointment as soon as possible for a visit in 2 week(s)        Follow-up tests/labs: per Ortho     Signed:  Pura Spice, MD  06/05/2022  2:55 PM  **I personally spent 35 min on discharge**

## 2022-06-05 NOTE — Progress Notes (Signed)
[facility]  [place], [place] [address]  [phone]        Hospitalist Progress Note      NAME: Meghan [name]   DOB:  01-Dec-1945  MRM:  [id number]    Date/Time of service: 06/05/2022  12:16 PM       Subjective:     Chief Complaint:  Patient was personally seen and examined by me during this time period.  Chart reviewed.  Has mild ankle pain       Objective:       Vitals:       Last 24hrs VS reviewed since prior progress note. Most recent are:    Vitals:    06/05/22 1158   BP: 105/68   Pulse: 91   Resp: 18   Temp: 98.2 F (36.8 C)   SpO2: 96%     SpO2 Readings from Last 6 Encounters:   06/05/22 96%   03/25/22 94%   03/25/22 99%          Intake/Output Summary (Last 24 hours) at 06/05/2022 1216  Last data filed at 06/05/2022 0332  Gross per 24 hour   Intake 450 ml   Output 150 ml   Net 300 ml          Exam:     Physical Exam:    Gen:  elderly, frail, ill-appearing, NAD  HEENT:  Pink conjunctivae, PERRL, hearing intact to voice, moist mucous membranes  Neck:  Supple, without masses, thyroid non-tender  Resp:  No accessory muscle use, clear breath sounds without wheezes rales or rhonchi  Card:  No murmurs, normal S1, S2 without thrills, bruits or peripheral edema  Abd:  Soft, non-tender, non-distended, normoactive bowel sounds are present  Musc:  No cyanosis or clubbing.  External fixation present   Skin:  wound at fracture site,  see wound care note   Neuro:  moves all ext, generalized weakness  Psych:  poor insight, oriented to person    Medications Reviewed: (see below)    Lab Data Reviewed: (see below)    ______________________________________________________________________    Medications:     Current Facility-Administered Medications   Medication Dose Route Frequency    HYDROmorphone (DILAUDID) injection 0.5 mg  0.5 mg IntraVENous Q4H PRN    lisinopril (PRINIVIL;ZESTRIL) tablet 10 mg  10 mg Oral Daily    acidophilus probiotic capsule 1 capsule  1 capsule Oral  Daily    divalproex (DEPAKOTE) DR tablet 500 mg  500 mg Oral 2 times per day    lacosamide (VIMPAT) tablet 100 mg  100 mg Oral BID    sodium chloride flush 0.9 % injection 5-40 mL  5-40 mL IntraVENous 2 times per day    sodium chloride flush 0.9 % injection 5-40 mL  5-40 mL IntraVENous PRN    0.9 % sodium chloride infusion   IntraVENous PRN    ondansetron (ZOFRAN-ODT) disintegrating tablet 4 mg  4 mg Oral Q8H PRN    Or    ondansetron (ZOFRAN) injection 4 mg  4 mg IntraVENous Q6H PRN    enoxaparin Sodium (LOVENOX) injection 30 mg  30 mg SubCUTAneous BID    HYDROcodone-acetaminophen (NORCO) 5-325 MG per tablet 1 tablet  1 tablet Oral Q4H PRN    hydrALAZINE (APRESOLINE) injection 10 mg  10 mg IntraVENous Q6H PRN    bupivacaine (PF) (MARCAINE) 0.5 % injection 150 mg  30 mL IntraDERmal Once    iopamidol (ISOVUE-370) 76 %  injection 100 mL  100 mL IntraVENous ONCE PRN    glucose chewable tablet 16 g  4 tablet Oral PRN    dextrose bolus 10% 125 mL  125 mL IntraVENous PRN    Or    dextrose bolus 10% 250 mL  250 mL IntraVENous PRN    dextrose 10 % infusion   IntraVENous Continuous PRN    naloxone (NARCAN) injection 0.4 mg  0.4 mg IntraVENous PRN    sodium chloride flush 0.9 % injection 5-40 mL  5-40 mL IntraVENous 2 times per day    0.9 % sodium chloride infusion   IntraVENous PRN    polyethylene glycol (GLYCOLAX) packet 17 g  17 g Oral Daily PRN    acetaminophen (TYLENOL) tablet 650 mg  650 mg Oral Q6H PRN    Or    acetaminophen (TYLENOL) suppository 650 mg  650 mg Rectal Q6H PRN    dextrose bolus 10% 125 mL  125 mL IntraVENous PRN    Or    dextrose bolus 10% 250 mL  250 mL IntraVENous PRN    glucagon injection 1 mg  1 mg SubCUTAneous PRN    dextrose 10 % infusion   IntraVENous Continuous PRN    aspirin chewable tablet 81 mg  81 mg Oral Daily    citalopram (CELEXA) tablet 10 mg  10 mg Oral Daily    donepezil (ARICEPT) tablet 10 mg  10 mg Oral Nightly    memantine (NAMENDA) tablet 10 mg  10 mg Oral BID    rOPINIRole (REQUIP)  tablet 1 mg  1 mg Oral Nightly          Lab Review:     Recent Labs     06/05/22  0230   WBC 10.4   HGB 11.2*   HCT 35.5   PLT 832*       Recent Labs     06/05/22  0230   NA 143   K 3.9   CL 110*   CO2 29   BUN 18   MG 1.9   PHOS 3.2       No results found for: "GLUCPOC"       Assessment / Plan:     76 yo hx of HTN, Parkinson's dementia, seizure d/o, presented w/ AMS, hypoglycemia, hypotension.  Hospital course complicated by fall, R ankle fracture s/p external fixation 10/16    1) R ankle fracture/pain: due to GLF.  S/p external fixation by Dr. Dema Severin on 10/16.  S/p bedside debridement of blister on 10/20.  Cont wound care.  Will cont IV dilaudid prn severe pain.  Cont PT/OT.  Plan for ORIF next week.      2) Acute met encephalopathy: now stable.  Likely from hypoglycemia, hypotension.  EEG neg for seizures.  Head MRI unremarkable.  Will monitor for agitation    3) Hypotension/hypoglycemia: now resolved.  ACTH stim test neg for adrenal insuff.  Endocrine was following    4) AKI: resolved with IVF    5) HTN: BP stable.  Cont lisinopril.  Use IV hydralazine prn    6) DM type 2 w/ diabetic neuropathy: A1C 5.6%.  Off insulin due to hypoglycemia.  Diet control     7) Thrombocytosis: likely due to fracture.  No further workup.  Will monitor     8) Parkinson's dementia/depression seems to be at baseline.  Cont Namenda, Aricept, celexa     9) Seizure d/o: EEG neg.  Cont Vimpat, depakote    **  Prior records, notes, labs, radiology, and medications reviewed in [facility]**    Total time spent with patient care: 30 Minutes **I personally saw and examined the patient during this time period**                 Care Plan discussed with: Patient, nursing, Ortho    Discussed:  Care Plan    Prophylaxis:  Lovenox    Disposition:  SNF/LTC/rehab pending, then return for ORIF next week            ___________________________________________________    Attending Physician: Pura Spice, MD

## 2022-06-06 NOTE — Plan of Care (Signed)
Problem: Occupational Therapy - Adult  Goal: By Discharge: Performs self-care activities at highest level of function for planned discharge setting.  See evaluation for individualized goals.  Description: FUNCTIONAL STATUS PRIOR TO ADMISSION:  Pt is independent with ADLs and mobility.      HOME SUPPORT: Patient is a poor historian, reporting she is visiting from out of town ([place]) and is staying with her sister here in [place]. Per chart she resides in a Group Home.    Occupational Therapy Goals:  Re-Evaluation 05/31/2022 - now RLE NWB s/p R ankle external fixation (sustained R ankle fracture during GLF this admission)  1.  Patient will perform seated grooming with Supervision/Set-up within 7 day(s).  2.  Patient will perform lateral transfer to drop-arm chair/BSC with Minimal Assist x1 in prep for OOB ADL engagementwithin 7 day(s).  3.  Patient will perform seated/standing bathing with Moderate Assist within 7 day(s).  4.  Patient will perform stand-pivot toilet transfers with Moderate Assist and Assist x2 using RW within 7 day(s).  5.  Patient will perform all aspects of toileting with Minimal Assist within 7 day(s).  6.  Patient will participate in upper extremity therapeutic exercise/activities with Supervision for 10 minutes within 7 day(s).    7.  Patient will utilize energy conservation and fall prevention techniques during functional activities with verbal cues within 7 day(s).    Initiated 05/24/2022  1.  Patient will perform grooming, standing at sink, with Modified Independence within 7 day(s).  2.  Patient will perform lower body dressing with Modified Independence within 7 day(s).  3.  Patient will perform bathing, sitting and standing PRN, with Supervision within 7 day(s).  4.  Patient will perform toilet transfers with Modified Independence  within 7 day(s).  5.  Patient will perform all aspects of toileting with Modified Independence within 7 day(s).  6.  Patient will participate in upper  extremity therapeutic exercise/activities with Independence for 10 minutes within 7 day(s).    Outcome: Progressing   OCCUPATIONAL THERAPY TREATMENT  Patient: Meghan [name] (76 y.o. female)  Date: 06/06/2022  Primary Diagnosis: Encephalopathy [G93.40]  Acute encephalopathy [G93.40]  Closed head injury, initial encounter [S09.90XA]  Unwitnessed fall [R29.6]  Procedure(s) (LRB):  ANKLE EXTERNAL FIXATOR APPLICATION RIGHT (Right) 8 Days Post-Op   Precautions: Weight Bearing, Fall Risk (NWB RLE) Right Lower Extremity Weight Bearing: Non Weight Bearing              Chart, occupational therapy assessment, plan of care, and goals were reviewed.    ASSESSMENT  Patient continues to benefit from skilled OT services and is slowly progressing towards goals. Pt sat edge of bed approximately 10 min to engage with UB ADL's and grooming tasks. She stated "a little pain in her foot but not bad." Pt awaiting further surgery.             PLAN :  Patient continues to benefit from skilled intervention to address the above impairments.  Continue treatment per established plan of care to address goals.    Recommend with staff: ADL's, there ex, there act    Recommend next OT session: cont towards goals    Recommendation for discharge: (in order for the patient to meet his/her long term goals): Therapy up to 5 days/week in Skilled nursing facility    Other factors to consider for discharge: poor safety awareness, impaired cognition, high risk for falls, and concern for safely navigating or managing the home environment    IF  patient discharges home will need the following DME:        SUBJECTIVE:   Patient stated "I do not like [place]."    OBJECTIVE DATA SUMMARY:   Cognitive/Behavioral Status:  Orientation  Orientation Level: Oriented to person;Disoriented to situation  Cognition  Overall Cognitive Status: Exceptions    Functional Mobility and Transfers for ADLs:  Bed Mobility:    Min assist supine to sit    Transfers:    Not  tested      Balance:      Impaired standing balance      ADL Intervention:                   Grooming: Stand by assistance   Grooming Skilled Clinical Factors: seated edge of bed to wash her face       Activity Tolerance:   Fair   Please refer to the flowsheet for vital signs taken during this treatment.    After treatment:   Patient left in no apparent distress in bed and Call bell within reach    COMMUNICATION/EDUCATION:   The patient's plan of care was discussed with: physical therapy assistant, occupational therapist, and registered nurse    Patient Education  Education Given To: Patient  Education Provided: Role of Therapy  Education Method: Verbal  Barriers to Learning: Cognition;Hearing  Education Outcome: Continued education needed    Thank you for this referral.  Wells Guiles A. Love [place], COTA/L  Minutes: 10

## 2022-06-06 NOTE — Progress Notes (Signed)
[facility]  [place], [place] [address]  [phone]        Hospitalist Progress Note      NAME: Meghan [name]   DOB:  09-May-1946  MRM:  [id number]    Date/Time of service: 06/06/2022  1:38 PM       Subjective:     Chief Complaint:  Patient was personally seen and examined by me during this time period.  Chart reviewed.  Doing well.  No acute issues        Objective:       Vitals:       Last 24hrs VS reviewed since prior progress note. Most recent are:    Vitals:    06/06/22 1233   BP: 112/61   Pulse: 80   Resp: 18   Temp: 98.2 F (36.8 C)   SpO2: 100%     SpO2 Readings from Last 6 Encounters:   06/06/22 100%   03/25/22 94%   03/25/22 99%          Intake/Output Summary (Last 24 hours) at 06/06/2022 1338  Last data filed at 06/06/2022 0846  Gross per 24 hour   Intake 340 ml   Output 0 ml   Net 340 ml          Exam:     Physical Exam:    Gen:  elderly, frail, ill-appearing, NAD  HEENT:  Pink conjunctivae, PERRL, hearing intact to voice, moist mucous membranes  Neck:  Supple, without masses, thyroid non-tender  Resp:  No accessory muscle use, clear breath sounds without wheezes rales or rhonchi  Card:  No murmurs, normal S1, S2 without thrills, bruits or peripheral edema  Abd:  Soft, non-tender, non-distended, normoactive bowel sounds are present  Musc:  No cyanosis or clubbing.  External fixation present   Skin:  wound at fracture site,  see wound care note   Neuro:  moves all ext, generalized weakness  Psych:  poor insight, oriented to person    Medications Reviewed: (see below)    Lab Data Reviewed: (see below)    ______________________________________________________________________    Medications:     Current Facility-Administered Medications   Medication Dose Route Frequency    HYDROmorphone (DILAUDID) injection 0.5 mg  0.5 mg IntraVENous Q4H PRN    lisinopril (PRINIVIL;ZESTRIL) tablet 10 mg  10 mg Oral Daily    acidophilus probiotic capsule 1 capsule  1 capsule  Oral Daily    divalproex (DEPAKOTE) DR tablet 500 mg  500 mg Oral 2 times per day    lacosamide (VIMPAT) tablet 100 mg  100 mg Oral BID    sodium chloride flush 0.9 % injection 5-40 mL  5-40 mL IntraVENous 2 times per day    sodium chloride flush 0.9 % injection 5-40 mL  5-40 mL IntraVENous PRN    0.9 % sodium chloride infusion   IntraVENous PRN    ondansetron (ZOFRAN-ODT) disintegrating tablet 4 mg  4 mg Oral Q8H PRN    Or    ondansetron (ZOFRAN) injection 4 mg  4 mg IntraVENous Q6H PRN    enoxaparin Sodium (LOVENOX) injection 30 mg  30 mg SubCUTAneous BID    HYDROcodone-acetaminophen (NORCO) 5-325 MG per tablet 1 tablet  1 tablet Oral Q4H PRN    hydrALAZINE (APRESOLINE) injection 10 mg  10 mg IntraVENous Q6H PRN    bupivacaine (PF) (MARCAINE) 0.5 % injection 150 mg  30 mL IntraDERmal Once    iopamidol (  ISOVUE-370) 76 % injection 100 mL  100 mL IntraVENous ONCE PRN    glucose chewable tablet 16 g  4 tablet Oral PRN    dextrose bolus 10% 125 mL  125 mL IntraVENous PRN    Or    dextrose bolus 10% 250 mL  250 mL IntraVENous PRN    dextrose 10 % infusion   IntraVENous Continuous PRN    naloxone (NARCAN) injection 0.4 mg  0.4 mg IntraVENous PRN    sodium chloride flush 0.9 % injection 5-40 mL  5-40 mL IntraVENous 2 times per day    0.9 % sodium chloride infusion   IntraVENous PRN    polyethylene glycol (GLYCOLAX) packet 17 g  17 g Oral Daily PRN    acetaminophen (TYLENOL) tablet 650 mg  650 mg Oral Q6H PRN    Or    acetaminophen (TYLENOL) suppository 650 mg  650 mg Rectal Q6H PRN    dextrose bolus 10% 125 mL  125 mL IntraVENous PRN    Or    dextrose bolus 10% 250 mL  250 mL IntraVENous PRN    glucagon injection 1 mg  1 mg SubCUTAneous PRN    dextrose 10 % infusion   IntraVENous Continuous PRN    aspirin chewable tablet 81 mg  81 mg Oral Daily    citalopram (CELEXA) tablet 10 mg  10 mg Oral Daily    donepezil (ARICEPT) tablet 10 mg  10 mg Oral Nightly    memantine (NAMENDA) tablet 10 mg  10 mg Oral BID    rOPINIRole  (REQUIP) tablet 1 mg  1 mg Oral Nightly          Lab Review:     Recent Labs     06/05/22  0230   WBC 10.4   HGB 11.2*   HCT 35.5   PLT 832*       Recent Labs     06/05/22  0230   NA 143   K 3.9   CL 110*   CO2 29   BUN 18   MG 1.9   PHOS 3.2       No results found for: "GLUCPOC"       Assessment / Plan:     76 yo hx of HTN, Parkinson's dementia, seizure d/o, presented w/ AMS, hypoglycemia, hypotension.  Hospital course complicated by fall, R ankle fracture s/p external fixation 10/16    1) R ankle fracture/pain: due to GLF.  S/p external fixation by Dr. Dema Severin on 10/16.  S/p bedside debridement of blister on 10/20.  Cont wound care.  Will cont IV dilaudid prn severe pain.  Cont PT/OT.  Timing of ORIF per Ortho     2) Acute met encephalopathy: now stable.  Likely from hypoglycemia, hypotension.  EEG neg for seizures.  Head MRI unremarkable.  Will monitor for agitation    3) Hypotension/hypoglycemia: now resolved.  ACTH stim test neg for adrenal insuff.  Endocrine was following    4) AKI: resolved with IVF    5) HTN: BP stable.  Cont lisinopril.  Use IV hydralazine prn    6) DM type 2 w/ diabetic neuropathy: A1C 5.6%.  Off insulin due to hypoglycemia.  Diet control     7) Thrombocytosis: likely due to fracture.  No further workup.  Will monitor     8) Parkinson's dementia/depression seems to be at baseline.  Cont Namenda, Aricept, celexa     9) Seizure d/o: EEG neg.  Cont Vimpat, depakote    **  Prior records, notes, labs, radiology, and medications reviewed in [facility]**    Total time spent with patient care: 30 Minutes **I personally saw and examined the patient during this time period**                 Care Plan discussed with: Patient, nursing, Ortho, CM     Discussed:  Care Plan    Prophylaxis:  Lovenox    Disposition:  SNF/LTC/ after ORIF.  Unknown timing at this point            ___________________________________________________    Attending Physician: Pura Spice, MD

## 2022-06-06 NOTE — Plan of Care (Signed)
Problem: Physical Therapy - Adult  Goal: By Discharge: Performs mobility at highest level of function for planned discharge setting.  See evaluation for individualized goals.  Description: Physical Therapy Goals  Revised 05/31/2022  1.  Patient will move from supine to sit and sit to supine  in bed with minimal assistance/contact guard assist within 7 day(s).  2.  Patient will do lateral transfer from bed to chair and chair to bed with minimal assistance/contact guard assist using the least restrictive device within 7 day(s).  3.  Patient will perform sit to stand with minimal assistance/contact guard assist within 7 day(s).  4.  Patient will ambulate with minimal assistance/contact guard assist for 5x2 feet with the least restrictive device within 7 day(s).        FUNCTIONAL STATUS PRIOR TO ADMISSION: Patient was modified independent using a rolling walker for functional mobility. The patient lives in [place] and is in town visiting her sister who lives locally.     HOME SUPPORT PRIOR TO ADMISSION: The patient lived with her significant other in [place] but did not require assistance. She reports 2 falls in the past month.     Physical Therapy Goals  Initiated 05/24/2022  1.  Patient will move from supine to sit and sit to supine, scoot up and down, and roll side to side in bed with independence within 7 day(s).    2.  Patient will perform sit to stand with contact guard assist within 7 day(s).  3.  Patient will transfer from bed to chair and chair to bed with supervision/set-up using the least restrictive device within 7 day(s).  4.  Patient will ambulate with supervision/set-up for 50 feet with the least restrictive device within 7 day(s).   5.  Patient will ascend/descend 4 stairs with 1-2 handrail(s) with contact guard assist within 7 day(s).    Outcome: Progressing   PHYSICAL THERAPY TREATMENT    Patient: Meghan [name] (76 y.o. female)  Date: 06/06/2022  Diagnosis: Encephalopathy [G93.40]  Acute encephalopathy  [G93.40]  Closed head injury, initial encounter [S09.90XA]  Unwitnessed fall [R29.6] Encephalopathy  Procedure(s) (LRB):  ANKLE EXTERNAL FIXATOR APPLICATION RIGHT (Right) 8 Days Post-Op  Precautions: Weight Bearing, Fall Risk (NWB RLE) Right Lower Extremity Weight Bearing: Non Weight Bearing                  ASSESSMENT:  Patient continues to benefit from skilled PT services.Pt supine to sit with mod assist.Pt was able to sit on EOB 10 minutes.Pt performed hip and knee extensions to left LE.Pt has not been able to maintain NWB on right standing so it was deferred today.Lateral transfers will be considered next treatment.Pt CGA to min assist sit to supine.Continue goals.         PLAN:  Patient continues to benefit from skilled intervention to address the above impairments.  Continue treatment per established plan of care.    Recommendation for discharge: (in order for the patient to meet his/her long term goals): Therapy up to 5 days/week in Skilled nursing facility    Other factors to consider for discharge:       IF patient discharges home will need the following DME:  TBD       SUBJECTIVE:       OBJECTIVE DATA SUMMARY:   Critical Behavior:  Orientation  Orientation Level: Oriented to person;Disoriented to situation  Cognition  Overall Cognitive Status: Exceptions    Functional Mobility Training:  Bed Mobility:   Pt CGA supine  to sit     Balance:   SBA sitting EOB   Ambulation/Gait Training:   Pt unable to ambulate at this time.         Activity Tolerance:   Good    After treatment:   Patient left in no apparent distress in bed      COMMUNICATION/EDUCATION:   The patient's plan of care was discussed with: physical therapist           Waneta Martins. Laurielle Selmon, PTA  Minutes: 63

## 2022-06-06 NOTE — Progress Notes (Signed)
Orthopedic NP Progress Note  Post Op Day: 8 Days Post-Op    June 06, 2022 10:58 AM     [name]    Attending Physician: Treatment Team: Attending Provider: Dayna Barker, MD; Consulting Physician: Catha Brow, MD; Case Manager: Rebbeca Paul; Consulting Physician: Lyman Speller, MD; Consulting Physician: Gus Puma, APRN - NP; Consulting Physician: Carlisle Cater, MD; Surgeon: Paulino Rily., MD; Charge Nurse: Talmadge Chad, RN; Patient Care Tech: Wendi Maya; Registered Nurse: Delsa Sale, RN; Occupational Therapist Assistant: Sonnie Alamo; Physical Therapist Assistant: Anell Barr, PTA     Vital Signs:    Patient Vitals for the past 8 hrs:   BP Temp Temp src Pulse Resp SpO2   06/06/22 0736 (!) 151/86 99 F (37.2 C) Oral 83 18 94 %   06/06/22 0428 127/78 98.6 F (37 C) Oral 73 16 99 %            Intake/Output:  No intake/output data recorded.  10/22 1901 - 10/24 0700  In: 1060 [P.O.:1060]  Out: 150 [Urine:150]    Pain Control:        LAB:    Recent Labs     06/05/22  0230   HCT 35.5   HGB 11.2*       Lab Results   Component Value Date/Time    NA 143 06/05/2022 02:30 AM    K 3.9 06/05/2022 02:30 AM    CL 110 06/05/2022 02:30 AM    CO2 29 06/05/2022 02:30 AM    BUN 18 06/05/2022 02:30 AM       Subjective:  Meghan [name] is a 76 y.o. female s/p a  Procedure(s):  ANKLE EXTERNAL FIXATOR APPLICATION RIGHT   Procedure(s):  ANKLE EXTERNAL FIXATOR APPLICATION RIGHT. Tolerating diet. Pt awake and alert today, no pain currently        Objective:   General: alert, cooperative, no distress.    Neuro/Vascular: CNS Intact.  Sensation stable. Brisk cap refill, + pulses UE/LE  Musculoskeletal:   RLE - ex fix to ankle, able to wiggle toes, NVI   (see wound RN pics of skin form 10/23)   Skin: warm and dry   Dressing - clean, dry and intact.          PT/OT:   Gait:                      Assessment:    s/p Procedure(s):  ANKLE EXTERNAL FIXATOR APPLICATION RIGHT    Principal  Problem:    Encephalopathy  Active Problems:    AKI (acute kidney injury) (HCC)    Hypoglycemia    Stroke (cerebrum) (HCC)    Complex partial seizure evolving to generalized seizure (HCC)    Dementia without behavioral disturbance (HCC)    HLD (hyperlipidemia)    Type 2 diabetes mellitus with diabetic neuropathy (HCC)    Closed fracture dislocation of ankle joint, right, initial encounter  Resolved Problems:    * No resolved hospital problems. *       Plan:   -  -  R trimal fracture s/p Ex Fix placement - pt was seen by wound care this AM with plan for wound care MWF with Saline moistened hydrofera blue to open blister, Dry gauze/ABD/kerlex/ace, continue daily pin care with saline and xeroform around pin sites and leave the pin care sites accessible when wrapping blisters, pin to be completed today per RN  -  NWB RLE with PT/OT, ice and elevate ono 2-3 pillows at all times,  pain management as per orders   - Lovenox daily for the next 30 days   - per discussion with CM and Dr. Lazaro Arms the plan is to keep the pt until ORIF, we eval skin tomorrow with dressing change and make decision regarding surgery either this Thursday but more likely next Thursday based on skin eval yesterday        Signed By: Gladstone Lighter, APRN - NP    Orthopedic Nurse Practitioner

## 2022-06-06 NOTE — Plan of Care (Signed)
Problem: Skin/Tissue Integrity  Goal: Absence of new skin breakdown  Description: 1.  Monitor for areas of redness and/or skin breakdown  2.  Assess vascular access sites hourly  3.  Every 4-6 hours minimum:  Change oxygen saturation probe site  4.  Every 4-6 hours:  If on nasal continuous positive airway pressure, respiratory therapy assess nares and determine need for appliance change or resting period.  Outcome: Progressing     Problem: Safety - Adult  Goal: Free from fall injury  Outcome: Progressing     Problem: Physical Therapy - Adult  Goal: By Discharge: Performs mobility at highest level of function for planned discharge setting.  See evaluation for individualized goals.  Description: Physical Therapy Goals  Revised 05/31/2022  1.  Patient will move from supine to sit and sit to supine  in bed with minimal assistance/contact guard assist within 7 day(s).  2.  Patient will do lateral transfer from bed to chair and chair to bed with minimal assistance/contact guard assist using the least restrictive device within 7 day(s).  3.  Patient will perform sit to stand with minimal assistance/contact guard assist within 7 day(s).  4.  Patient will ambulate with minimal assistance/contact guard assist for 5x2 feet with the least restrictive device within 7 day(s).        FUNCTIONAL STATUS PRIOR TO ADMISSION: Patient was modified independent using a rolling walker for functional mobility. The patient lives in [place] and is in town visiting her sister who lives locally.     HOME SUPPORT PRIOR TO ADMISSION: The patient lived with her significant other in [place] but did not require assistance. She reports 2 falls in the past month.     Physical Therapy Goals  Initiated 05/24/2022  1.  Patient will move from supine to sit and sit to supine, scoot up and down, and roll side to side in bed with independence within 7 day(s).    2.  Patient will perform sit to stand with contact guard assist within 7 day(s).  3.  Patient will  transfer from bed to chair and chair to bed with supervision/set-up using the least restrictive device within 7 day(s).  4.  Patient will ambulate with supervision/set-up for 50 feet with the least restrictive device within 7 day(s).   5.  Patient will ascend/descend 4 stairs with 1-2 handrail(s) with contact guard assist within 7 day(s).    06/05/2022 1320 by Regan Lemming, PTA  Outcome: Progressing     Problem: Chronic Conditions and Co-morbidities  Goal: Patient's chronic conditions and co-morbidity symptoms are monitored and maintained or improved  Outcome: Progressing     Problem: Occupational Therapy - Adult  Goal: By Discharge: Performs self-care activities at highest level of function for planned discharge setting.  See evaluation for individualized goals.  Description: FUNCTIONAL STATUS PRIOR TO ADMISSION:  Pt is independent with ADLs and mobility.      HOME SUPPORT: Patient is a poor historian, reporting she is visiting from out of town ([place]) and is staying with her sister here in [place]. Per chart she resides in a Group Home.    Occupational Therapy Goals:  Re-Evaluation 05/31/2022 - now RLE NWB s/p R ankle external fixation (sustained R ankle fracture during GLF this admission)  1.  Patient will perform seated grooming with Supervision/Set-up within 7 day(s).  2.  Patient will perform lateral transfer to drop-arm chair/BSC with Minimal Assist x1 in prep for OOB ADL engagementwithin 7 day(s).  3.  Patient will perform seated/standing bathing with Moderate Assist within 7 day(s).  4.  Patient will perform stand-pivot toilet transfers with Moderate Assist and Assist x2 using RW within 7 day(s).  5.  Patient will perform all aspects of toileting with Minimal Assist within 7 day(s).  6.  Patient will participate in upper extremity therapeutic exercise/activities with Supervision for 10 minutes within 7 day(s).    7.  Patient will utilize energy conservation and fall prevention techniques during  functional activities with verbal cues within 7 day(s).    Initiated 05/24/2022  1.  Patient will perform grooming, standing at sink, with Modified Independence within 7 day(s).  2.  Patient will perform lower body dressing with Modified Independence within 7 day(s).  3.  Patient will perform bathing, sitting and standing PRN, with Supervision within 7 day(s).  4.  Patient will perform toilet transfers with Modified Independence  within 7 day(s).  5.  Patient will perform all aspects of toileting with Modified Independence within 7 day(s).  6.  Patient will participate in upper extremity therapeutic exercise/activities with Independence for 10 minutes within 7 day(s).    06/05/2022 1340 by Lilian Kapur A, OTA  Outcome: Progressing     Problem: Discharge Planning  Goal: Discharge to home or other facility with appropriate resources  Outcome: Progressing     Problem: Neurosensory - Adult  Goal: Achieves stable or improved neurological status  Outcome: Progressing     Problem: Respiratory - Adult  Goal: Achieves optimal ventilation and oxygenation  Outcome: Progressing     Problem: Cardiovascular - Adult  Goal: Maintains optimal cardiac output and hemodynamic stability  Outcome: Progressing     Problem: Skin/Tissue Integrity - Adult  Goal: Skin integrity remains intact  Outcome: Progressing     Problem: Musculoskeletal - Adult  Goal: Return mobility to safest level of function  Outcome: Progressing     Problem: Gastrointestinal - Adult  Goal: Minimal or absence of nausea and vomiting  Outcome: Progressing     Problem: Genitourinary - Adult  Goal: Absence of urinary retention  Outcome: Progressing     Problem: Infection - Adult  Goal: Absence of infection at discharge  Outcome: Progressing     Problem: Metabolic/Fluid and Electrolytes - Adult  Goal: Electrolytes maintained within normal limits  Outcome: Progressing     Problem: Hematologic - Adult  Goal: Maintains hematologic stability  Outcome: Progressing      Problem: Pain  Goal: Verbalizes/displays adequate comfort level or baseline comfort level  Outcome: Progressing     Problem: Nutrition Deficit:  Goal: Optimize nutritional status  Outcome: Progressing  Flowsheets (Taken 95/04/3266 1245 by Ola Spurr, RD)  Nutrient intake appropriate for improving, restoring, or maintaining nutritional needs:   Assess nutritional status and recommend course of action   Recommend appropriate diets, oral nutritional supplements, and vitamin/mineral supplements     Problem: ABCDS Injury Assessment  Goal: Absence of physical injury  Outcome: Progressing

## 2022-06-07 NOTE — Care Coordination-Inpatient (Signed)
Per IDR, Pt is ready for discharge. A call was made to [facility], they are unable to accept prior to Pt having procedure. Due to [facility] inability to admit, CM to discuss SNF option with family.     Phone call was placed with Pt's son and DIL to notify of discharge plan for SNF. Pt's son and DIL  is agreeable to SNF recommendation. CM was requested to send a referral to: [facility], [facility] [facility], [facility], [place].    CM notified Pt's Son and DIL of potential discharge today, if an accepting facility is established. Son and DIL were receptive to plan.         Florene Glen, [place], [place]  [phone]

## 2022-06-07 NOTE — Progress Notes (Signed)
Spiritual Care Assessment/Progress Note  [place]    Name: VERGIE ZAHM MRN: [id number]    Age: 76 y.o.     Sex: female   Language: English     Date: 06/07/2022            Total Time Calculated: 9 min              Spiritual Assessment begun in [facility] B5 MULTI-SPECIALTY ONCOLOGY 1  Service Provided For:: Patient  Referral/Consult From:: Rounding  Encounter Overview/Reason : Initial Encounter    Spiritual beliefs:      [x]  Involved in a faith tradition/spiritual practice: Baptist     [x]  Supported by a faith community: [place] in [place]     []  Claims no spiritual orientation:      []  Seeking spiritual identity:           []  Adheres to an individual form of spirituality:      []  Not able to assess:                Identified resources for coping and support system:   Support System: Other (Comment) (Pt staying with her sister.)       [x]  Prayer                  []  Devotional reading               []  Music                  []  Guided Imagery     []  Pet visits                                        []  Other: (COMMENT)     Specific area/focus of visit   Encounter:    Crisis:    Spiritual/Emotional needs: Type: Spiritual Support  Ritual, Rites and Sacraments:    Grief, Loss, and Adjustments:    Ethics/Mediation:    Behavioral Health:    Palliative Care:    Advance Care Planning:      Plan/Referrals:  (Contact spiritual care if further assistance is needed.)    Narrative: Chart reviewed. I visited patient Neal Oshea on the multi specialty oncology floor in room 502. Patient was sitting upright in her chair. I introduced my self and ment and conversed with the patient. In the encounter I needed to raise my voice in tone for patient to hear. The patient is of the [place] tradition. Her local congregation is in [place]. Patient Rayborn has one son who lives in [place]. Currently the patient is staying with her sister locally. Patient Plante shared her brief medical history  and to what happened to her while being admitted. The patient's morale is well and appreciated the prayer on encouragement. I practiced a listening ear and showed empathy towards her situation.   Jerrell Belfast, MDiv  Staff Chaplain  Paging Service [phone] (PRAY)

## 2022-06-07 NOTE — Plan of Care (Signed)
Problem: Occupational Therapy - Adult  Goal: By Discharge: Performs self-care activities at highest level of function for planned discharge setting.  See evaluation for individualized goals.  Description: FUNCTIONAL STATUS PRIOR TO ADMISSION:  Pt is independent with ADLs and mobility.      HOME SUPPORT: Patient is a poor historian, reporting she is visiting from out of town ([place]) and is staying with her sister here in [place]. Per chart she resides in a Group Home.    Occupational Therapy Goals:  Weekly re assessment 10/252023- continue goals   Re-Evaluation 05/31/2022 - now RLE NWB s/p R ankle external fixation (sustained R ankle fracture during GLF this admission)  1.  Patient will perform seated grooming with Supervision/Set-up within 7 day(s).  2.  Patient will perform lateral transfer to drop-arm chair/BSC with Minimal Assist x1 in prep for OOB ADL engagementwithin 7 day(s).  3.  Patient will perform seated/standing bathing with Moderate Assist within 7 day(s).  4.  Patient will perform stand-pivot toilet transfers with Moderate Assist and Assist x2 using RW within 7 day(s).  5.  Patient will perform all aspects of toileting with Minimal Assist within 7 day(s).  6.  Patient will participate in upper extremity therapeutic exercise/activities with Supervision for 10 minutes within 7 day(s).    7.  Patient will utilize energy conservation and fall prevention techniques during functional activities with verbal cues within 7 day(s).    Initiated 05/24/2022  1.  Patient will perform grooming, standing at sink, with Modified Independence within 7 day(s).  2.  Patient will perform lower body dressing with Modified Independence within 7 day(s).  3.  Patient will perform bathing, sitting and standing PRN, with Supervision within 7 day(s).  4.  Patient will perform toilet transfers with Modified Independence  within 7 day(s).  5.  Patient will perform all aspects of toileting with Modified Independence within 7  day(s).  6.  Patient will participate in upper extremity therapeutic exercise/activities with Independence for 10 minutes within 7 day(s).    Outcome: Progressing   OCCUPATIONAL THERAPY TREATMENT: WEEKLY REASSESSMENT    Patient: Meghan [name] (76 y.o. female)  Date: 06/07/2022  Primary Diagnosis: Encephalopathy [G93.40]  Acute encephalopathy [G93.40]  Closed head injury, initial encounter [S09.90XA]  Unwitnessed fall [R29.6]  Procedure(s) (LRB):  ANKLE EXTERNAL FIXATOR APPLICATION RIGHT (Right) 9 Days Post-Op   Precautions: Weight Bearing, Fall Risk (NWB RLE) Right Lower Extremity Weight Bearing: Non Weight Bearing              Chart, occupational therapy assessment, plan of care, and goals were reviewed.    ASSESSMENT  Patient continues to benefit from skilled OT services and is progressing towards goals. Patient tolerated transfer to chair using lateral scooting.  Some difficulty following commands, planning movement, but patient also HOH and with difficulty following verbal instruction. Patient requires assist x 2 for scooting to chair.  Patient maintaining NWB with support to RLE.  Patient left in chair in NAD.       PLAN  Goals have been updated based on progression since last assessment.  Patient continues to benefit from skilled intervention to address the above impairments.    Recommendations and Planned Interventions:   self care training, therapeutic activities, functional mobility training, balance training, therapeutic exercise, endurance activities, patient education, home safety training, and family training/education    Frequency/Duration: OT Plan of Care: 5 times/week    Recommend with staff: chair and commode transfers     Recommend next  OT session: progression of goals     Recommendation for discharge: (in order for the patient to meet his/her long term goals): Therapy up to 5 days/week in Skilled nursing facility    Other factors to consider for discharge:  TBD    IF patient discharges home  will need the following DME:  TBD       SUBJECTIVE:   Patient stated "ok."    OBJECTIVE DATA SUMMARY:   Cognitive/Behavioral Status:          Functional Mobility and Transfers for ADLs:  Bed Mobility:  Bed Mobility Training  Rolling: Contact-guard assistance  Supine to Sit: Contact-guard assistance;Additional time;Adaptive equipment  Scooting: Assist X2     Transfers:   Pharmacologist: Yes  Bed to Chair: Minimum assistance;Assist X2    Balance:     Balance  Sitting: Intact  Sitting - Static: Good (unsupported)  Sitting - Dynamic: Good (unsupported)    ADL Intervention:         Feeding: Setup                       LE Bathing: Minimal assistance  LE Bathing Skilled Clinical Factors: for peri area only                                   Pain Rating:  Does not report pain/10   Pain Intervention(s):        Activity Tolerance:   Good  Please refer to the flowsheet for vital signs taken during this treatment.    After treatment:   Patient left in no apparent distress sitting up in chair, Call bell within reach, and Bed/ chair alarm activated    COMMUNICATION/EDUCATION:   The patient's plan of care was discussed with: physical therapist and registered nurse         Thank you for this referral.  Wadie Lessen, OTR/L  Minutes: 28

## 2022-06-07 NOTE — Plan of Care (Signed)
Problem: Skin/Tissue Integrity  Goal: Absence of new skin breakdown  Description: 1.  Monitor for areas of redness and/or skin breakdown  2.  Assess vascular access sites hourly  3.  Every 4-6 hours minimum:  Change oxygen saturation probe site  4.  Every 4-6 hours:  If on nasal continuous positive airway pressure, respiratory therapy assess nares and determine need for appliance change or resting period.  06/07/2022 2321 by Shainna Faux, Jaynee Eagles, RN  Outcome: Progressing  06/07/2022 1955 by Kawan Valladolid, Jaynee Eagles, RN  Outcome: Progressing     Problem: Safety - Adult  Goal: Free from fall injury  06/07/2022 2321 by Teshawn Moan, Jaynee Eagles, RN  Outcome: Progressing  06/07/2022 1955 by Nevaen Tredway, Jaynee Eagles, RN  Outcome: Progressing     Problem: Physical Therapy - Adult  Goal: By Discharge: Performs mobility at highest level of function for planned discharge setting.  See evaluation for individualized goals.  Description: Physical Therapy Goals  Revised 05/31/2022  1.  Patient will move from supine to sit and sit to supine  in bed with minimal assistance/contact guard assist within 7 day(s).  2.  Patient will do lateral transfer from bed to chair and chair to bed with minimal assistance/contact guard assist using the least restrictive device within 7 day(s).  3.  Patient will perform sit to stand with minimal assistance/contact guard assist within 7 day(s).  4.  Patient will ambulate with minimal assistance/contact guard assist for 5x2 feet with the least restrictive device within 7 day(s).        FUNCTIONAL STATUS PRIOR TO ADMISSION: Patient was modified independent using a rolling walker for functional mobility. The patient lives in [place] and is in town visiting her sister who lives locally.     HOME SUPPORT PRIOR TO ADMISSION: The patient lived with her significant other in [place] but did not require assistance. She reports 2 falls in the past month.     Physical Therapy Goals  Initiated 05/24/2022  1.  Patient will move from supine to  sit and sit to supine, scoot up and down, and roll side to side in bed with independence within 7 day(s).    2.  Patient will perform sit to stand with contact guard assist within 7 day(s).  3.  Patient will transfer from bed to chair and chair to bed with supervision/set-up using the least restrictive device within 7 day(s).  4.  Patient will ambulate with supervision/set-up for 50 feet with the least restrictive device within 7 day(s).   5.  Patient will ascend/descend 4 stairs with 1-2 handrail(s) with contact guard assist within 7 day(s).    06/07/2022 1147 by Coral Ceo, PT  Outcome: Progressing     Problem: Occupational Therapy - Adult  Goal: By Discharge: Performs self-care activities at highest level of function for planned discharge setting.  See evaluation for individualized goals.  Description: FUNCTIONAL STATUS PRIOR TO ADMISSION:  Pt is independent with ADLs and mobility.      HOME SUPPORT: Patient is a poor historian, reporting she is visiting from out of town ([place]) and is staying with her sister here in [place]. Per chart she resides in a Group Home.    Occupational Therapy Goals:  Weekly re assessment 10/252023- continue goals   Re-Evaluation 05/31/2022 - now RLE NWB s/p R ankle external fixation (sustained R ankle fracture during GLF this admission)  1.  Patient will perform seated grooming with Supervision/Set-up within 7 day(s).  2.  Patient will perform lateral  transfer to drop-arm chair/BSC with Minimal Assist x1 in prep for OOB ADL engagementwithin 7 day(s).  3.  Patient will perform seated/standing bathing with Moderate Assist within 7 day(s).  4.  Patient will perform stand-pivot toilet transfers with Moderate Assist and Assist x2 using RW within 7 day(s).  5.  Patient will perform all aspects of toileting with Minimal Assist within 7 day(s).  6.  Patient will participate in upper extremity therapeutic exercise/activities with Supervision for 10 minutes within 7 day(s).    7.   Patient will utilize energy conservation and fall prevention techniques during functional activities with verbal cues within 7 day(s).    Initiated 05/24/2022  1.  Patient will perform grooming, standing at sink, with Modified Independence within 7 day(s).  2.  Patient will perform lower body dressing with Modified Independence within 7 day(s).  3.  Patient will perform bathing, sitting and standing PRN, with Supervision within 7 day(s).  4.  Patient will perform toilet transfers with Modified Independence  within 7 day(s).  5.  Patient will perform all aspects of toileting with Modified Independence within 7 day(s).  6.  Patient will participate in upper extremity therapeutic exercise/activities with Independence for 10 minutes within 7 day(s).    06/07/2022 1232 by Suezanne Cheshire C, OT  Outcome: Progressing     Problem: Chronic Conditions and Co-morbidities  Goal: Patient's chronic conditions and co-morbidity symptoms are monitored and maintained or improved  06/07/2022 2321 by Dillan Lunden, Zachery Dakins, RN  Outcome: Progressing  06/07/2022 1955 by Lylah Lantis, Zachery Dakins, RN  Outcome: Progressing     Problem: Discharge Planning  Goal: Discharge to home or other facility with appropriate resources  06/07/2022 2321 by Yehya Brendle, Zachery Dakins, RN  Outcome: Progressing  06/07/2022 1955 by Gahel Safley, Zachery Dakins, RN  Outcome: Progressing     Problem: Neurosensory - Adult  Goal: Achieves stable or improved neurological status  06/07/2022 2321 by Tonique Mendonca, Zachery Dakins, RN  Outcome: Progressing  06/07/2022 1955 by Lakasha Mcfall, Zachery Dakins, RN  Outcome: Progressing     Problem: Respiratory - Adult  Goal: Achieves optimal ventilation and oxygenation  06/07/2022 2321 by Jarry Manon, Zachery Dakins, RN  Outcome: Progressing  06/07/2022 1955 by Graelyn Bihl, Zachery Dakins, RN  Outcome: Progressing     Problem: Skin/Tissue Integrity - Adult  Goal: Skin integrity remains intact  06/07/2022 2321 by Mysha Peeler, Zachery Dakins, RN  Outcome: Progressing  06/07/2022 1955 by Chaunda Vandergriff, Zachery Dakins,  RN  Outcome: Progressing  Flowsheets (Taken 06/07/2022 1948)  Skin Integrity Remains Intact: Monitor for areas of redness and/or skin breakdown     Problem: Musculoskeletal - Adult  Goal: Return mobility to safest level of function  06/07/2022 2321 by Chanz Cahall, Zachery Dakins, RN  Outcome: Progressing  06/07/2022 1955 by Rhianne Soman, Zachery Dakins, RN  Outcome: Progressing     Problem: Gastrointestinal - Adult  Goal: Minimal or absence of nausea and vomiting  06/07/2022 2321 by Curran Lenderman, Zachery Dakins, RN  Outcome: Progressing  06/07/2022 1955 by Cyncere Sontag, Zachery Dakins, RN  Outcome: Progressing     Problem: Hematologic - Adult  Goal: Maintains hematologic stability  06/07/2022 2321 by Aamya Orellana, Zachery Dakins, RN  Outcome: Progressing  06/07/2022 1955 by Maicie Vanderloop, Zachery Dakins, RN  Outcome: Progressing     Problem: Pain  Goal: Verbalizes/displays adequate comfort level or baseline comfort level  Outcome: Progressing     Problem: Nutrition Deficit:  Goal: Optimize nutritional status  06/07/2022 2321 by Michille Mcelrath, Zachery Dakins, RN  Outcome: Progressing  06/07/2022 1955 by Anglea Gordner,  Jaynee Eagles, RN  Outcome: Progressing     Problem: ABCDS Injury Assessment  Goal: Absence of physical injury  06/07/2022 2321 by Vitaliy Eisenhour, Jaynee Eagles, RN  Outcome: Progressing  06/07/2022 1955 by Ramiel Forti, Jaynee Eagles, RN  Outcome: Progressing

## 2022-06-07 NOTE — Plan of Care (Signed)
Problem: Skin/Tissue Integrity  Goal: Absence of new skin breakdown  Description: 1.  Monitor for areas of redness and/or skin breakdown  2.  Assess vascular access sites hourly  3.  Every 4-6 hours minimum:  Change oxygen saturation probe site  4.  Every 4-6 hours:  If on nasal continuous positive airway pressure, respiratory therapy assess nares and determine need for appliance change or resting period.  Outcome: Progressing     Problem: Safety - Adult  Goal: Free from fall injury  Outcome: Progressing     Problem: Physical Therapy - Adult  Goal: By Discharge: Performs mobility at highest level of function for planned discharge setting.  See evaluation for individualized goals.  Description: Physical Therapy Goals  Revised 05/31/2022  1.  Patient will move from supine to sit and sit to supine  in bed with minimal assistance/contact guard assist within 7 day(s).  2.  Patient will do lateral transfer from bed to chair and chair to bed with minimal assistance/contact guard assist using the least restrictive device within 7 day(s).  3.  Patient will perform sit to stand with minimal assistance/contact guard assist within 7 day(s).  4.  Patient will ambulate with minimal assistance/contact guard assist for 5x2 feet with the least restrictive device within 7 day(s).        FUNCTIONAL STATUS PRIOR TO ADMISSION: Patient was modified independent using a rolling walker for functional mobility. The patient lives in [place] and is in town visiting her sister who lives locally.     HOME SUPPORT PRIOR TO ADMISSION: The patient lived with her significant other in [place] but did not require assistance. She reports 2 falls in the past month.     Physical Therapy Goals  Initiated 05/24/2022  1.  Patient will move from supine to sit and sit to supine, scoot up and down, and roll side to side in bed with independence within 7 day(s).    2.  Patient will perform sit to stand with contact guard assist within 7 day(s).  3.  Patient will  transfer from bed to chair and chair to bed with supervision/set-up using the least restrictive device within 7 day(s).  4.  Patient will ambulate with supervision/set-up for 50 feet with the least restrictive device within 7 day(s).   5.  Patient will ascend/descend 4 stairs with 1-2 handrail(s) with contact guard assist within 7 day(s).    06/07/2022 1147 by Coral Ceo, PT  Outcome: Progressing     Problem: Occupational Therapy - Adult  Goal: By Discharge: Performs self-care activities at highest level of function for planned discharge setting.  See evaluation for individualized goals.  Description: FUNCTIONAL STATUS PRIOR TO ADMISSION:  Pt is independent with ADLs and mobility.      HOME SUPPORT: Patient is a poor historian, reporting she is visiting from out of town ([place]) and is staying with her sister here in [place]. Per chart she resides in a Group Home.    Occupational Therapy Goals:  Weekly re assessment 10/252023- continue goals   Re-Evaluation 05/31/2022 - now RLE NWB s/p R ankle external fixation (sustained R ankle fracture during GLF this admission)  1.  Patient will perform seated grooming with Supervision/Set-up within 7 day(s).  2.  Patient will perform lateral transfer to drop-arm chair/BSC with Minimal Assist x1 in prep for OOB ADL engagementwithin 7 day(s).  3.  Patient will perform seated/standing bathing with Moderate Assist within 7 day(s).  4.  Patient will perform stand-pivot toilet  transfers with Moderate Assist and Assist x2 using RW within 7 day(s).  5.  Patient will perform all aspects of toileting with Minimal Assist within 7 day(s).  6.  Patient will participate in upper extremity therapeutic exercise/activities with Supervision for 10 minutes within 7 day(s).    7.  Patient will utilize energy conservation and fall prevention techniques during functional activities with verbal cues within 7 day(s).    Initiated 05/24/2022  1.  Patient will perform grooming, standing at  sink, with Modified Independence within 7 day(s).  2.  Patient will perform lower body dressing with Modified Independence within 7 day(s).  3.  Patient will perform bathing, sitting and standing PRN, with Supervision within 7 day(s).  4.  Patient will perform toilet transfers with Modified Independence  within 7 day(s).  5.  Patient will perform all aspects of toileting with Modified Independence within 7 day(s).  6.  Patient will participate in upper extremity therapeutic exercise/activities with Independence for 10 minutes within 7 day(s).    06/07/2022 1232 by Wadie Lessen, OT  Outcome: Progressing     Problem: Chronic Conditions and Co-morbidities  Goal: Patient's chronic conditions and co-morbidity symptoms are monitored and maintained or improved  Outcome: Progressing     Problem: Cardiovascular - Adult  Goal: Maintains optimal cardiac output and hemodynamic stability  Outcome: Progressing     Problem: Skin/Tissue Integrity - Adult  Goal: Skin integrity remains intact  Outcome: Progressing     Problem: Musculoskeletal - Adult  Goal: Return mobility to safest level of function  Outcome: Progressing     Problem: Hematologic - Adult  Goal: Maintains hematologic stability  Outcome: Progressing     Problem: Nutrition Deficit:  Goal: Optimize nutritional status  Outcome: Progressing

## 2022-06-07 NOTE — Plan of Care (Addendum)
Problem: Physical Therapy - Adult  Goal: By Discharge: Performs mobility at highest level of function for planned discharge setting.  See evaluation for individualized goals.  Description: Physical Therapy Goals  Revised 05/31/2022  1.  Patient will move from supine to sit and sit to supine  in bed with minimal assistance/contact guard assist within 7 day(s).  2.  Patient will do lateral transfer from bed to chair and chair to bed with minimal assistance/contact guard assist using the least restrictive device within 7 day(s).  3.  Patient will perform sit to stand with minimal assistance/contact guard assist within 7 day(s).  4.  Patient will ambulate with minimal assistance/contact guard assist for 5x2 feet with the least restrictive device within 7 day(s).        FUNCTIONAL STATUS PRIOR TO ADMISSION: Patient was modified independent using a rolling walker for functional mobility. The patient lives in [place] and is in town visiting her sister who lives locally.     HOME SUPPORT PRIOR TO ADMISSION: The patient lived with her significant other in [place] but did not require assistance. She reports 2 falls in the past month.     Physical Therapy Goals  Initiated 05/24/2022  1.  Patient will move from supine to sit and sit to supine, scoot up and down, and roll side to side in bed with independence within 7 day(s).    2.  Patient will perform sit to stand with contact guard assist within 7 day(s).  3.  Patient will transfer from bed to chair and chair to bed with supervision/set-up using the least restrictive device within 7 day(s).  4.  Patient will ambulate with supervision/set-up for 50 feet with the least restrictive device within 7 day(s).   5.  Patient will ascend/descend 4 stairs with 1-2 handrail(s) with contact guard assist within 7 day(s).    Outcome: Progressing   PHYSICAL THERAPY TREATMENT    Patient: Meghan [name] (76 y.o. female)  Date: 06/07/2022  Diagnosis: Encephalopathy [G93.40]  Acute encephalopathy  [G93.40]  Closed head injury, initial encounter [S09.90XA]  Unwitnessed fall [R29.6] Encephalopathy  Procedure(s) (LRB):  ANKLE EXTERNAL FIXATOR APPLICATION RIGHT (Right) 9 Days Post-Op  Precautions: Weight Bearing, Fall Risk (NWB RLE) Right Lower Extremity Weight Bearing: Non Weight Bearing                  ASSESSMENT:  Patient continues to benefit from skilled PT services and is slowly progressing towards goals. She continues to demonstrates decreased coordination and motor planning secondary to confusion/hearing loss. She demonstrates the ability to bridge to assist with donning/doffing brief. Patient is also able to perform long sitting to assist with bed mobility. She is able to complete lateral transfer to bedside chair with Ax2 for scooting and maintaining R LE NWB. Drop arm platform BSC is procured to continue to work on transfers with patient.     1546 Patient provided Min Ax2 to transfer back to bed with RN staff via use of drop arm chair.      PLAN:  Patient continues to benefit from skilled intervention to address the above impairments.  Continue treatment per established plan of care.    Recommendation for discharge: (in order for the patient to meet his/her long term goals): Therapy up to 5 days/week in Skilled nursing facility    Other factors to consider for discharge: no local support, patient's current support system is unable to meet their requirements for physical assistance, impaired cognition, high risk for falls, not  safe to be alone, concern for safely navigating or managing the home environment, and new weight bearing restrictions limiting activity or patient is unable to maintain    IF patient discharges home will need the following DME: continuing to assess with progress       SUBJECTIVE:   Patient stated, "If you say so."    OBJECTIVE DATA SUMMARY:   Critical Behavior:          Functional Mobility Training:  Bed Mobility:  Bed Mobility Training  Rolling: Contact-guard assistance  Supine  to Sit: Contact-guard assistance;Additional time;Adaptive equipment  Scooting: Assist X2  Transfers:  Art therapist: Yes  Bed to Chair: Minimum assistance;Assist X2  Balance:  Balance  Sitting - Static: Good (unsupported)  Sitting - Dynamic: Good (unsupported)   Ambulation/Gait Training:        Neuro Re-Education:                      Pain Intervention(s):       Activity Tolerance:   Good    After treatment:   Patient left in no apparent distress sitting up in chair, Call bell within reach, Bed/ chair alarm activated, and R LE elevated on two pillows      COMMUNICATION/EDUCATION:   The patient's plan of care was discussed with: occupational therapist and registered nurse           Coral Ceo, PT  Minutes: [phone]

## 2022-06-07 NOTE — Plan of Care (Signed)
Problem: Skin/Tissue Integrity  Goal: Absence of new skin breakdown  Description: 1.  Monitor for areas of redness and/or skin breakdown  2.  Assess vascular access sites hourly  3.  Every 4-6 hours minimum:  Change oxygen saturation probe site  4.  Every 4-6 hours:  If on nasal continuous positive airway pressure, respiratory therapy assess nares and determine need for appliance change or resting period.  Outcome: Progressing     Problem: Safety - Adult  Goal: Free from fall injury  Outcome: Progressing     Problem: Physical Therapy - Adult  Goal: By Discharge: Performs mobility at highest level of function for planned discharge setting.  See evaluation for individualized goals.  Description: Physical Therapy Goals  Revised 05/31/2022  1.  Patient will move from supine to sit and sit to supine  in bed with minimal assistance/contact guard assist within 7 day(s).  2.  Patient will do lateral transfer from bed to chair and chair to bed with minimal assistance/contact guard assist using the least restrictive device within 7 day(s).  3.  Patient will perform sit to stand with minimal assistance/contact guard assist within 7 day(s).  4.  Patient will ambulate with minimal assistance/contact guard assist for 5x2 feet with the least restrictive device within 7 day(s).        FUNCTIONAL STATUS PRIOR TO ADMISSION: Patient was modified independent using a rolling walker for functional mobility. The patient lives in [place] and is in town visiting her sister who lives locally.     HOME SUPPORT PRIOR TO ADMISSION: The patient lived with her significant other in [place] but did not require assistance. She reports 2 falls in the past month.     Physical Therapy Goals  Initiated 05/24/2022  1.  Patient will move from supine to sit and sit to supine, scoot up and down, and roll side to side in bed with independence within 7 day(s).    2.  Patient will perform sit to stand with contact guard assist within 7 day(s).  3.  Patient will  transfer from bed to chair and chair to bed with supervision/set-up using the least restrictive device within 7 day(s).  4.  Patient will ambulate with supervision/set-up for 50 feet with the least restrictive device within 7 day(s).   5.  Patient will ascend/descend 4 stairs with 1-2 handrail(s) with contact guard assist within 7 day(s).    06/06/2022 1552 by Kary Kos, PTA  Outcome: Progressing     Problem: Chronic Conditions and Co-morbidities  Goal: Patient's chronic conditions and co-morbidity symptoms are monitored and maintained or improved  Outcome: Progressing     Problem: Discharge Planning  Goal: Discharge to home or other facility with appropriate resources  Outcome: Progressing     Problem: Neurosensory - Adult  Goal: Achieves stable or improved neurological status  Outcome: Progressing     Problem: Respiratory - Adult  Goal: Achieves optimal ventilation and oxygenation  Outcome: Progressing     Problem: Cardiovascular - Adult  Goal: Maintains optimal cardiac output and hemodynamic stability  Outcome: Progressing     Problem: Skin/Tissue Integrity - Adult  Goal: Skin integrity remains intact  Outcome: Progressing     Problem: Infection - Adult  Goal: Absence of infection at discharge  Outcome: Progressing     Problem: Metabolic/Fluid and Electrolytes - Adult  Goal: Electrolytes maintained within normal limits  Outcome: Progressing     Problem: Pain  Goal: Verbalizes/displays adequate comfort level or baseline comfort level  Outcome: Progressing     Problem: ABCDS Injury Assessment  Goal: Absence of physical injury  Outcome: Progressing

## 2022-06-07 NOTE — Progress Notes (Signed)
[facility]  [place], [place] [address]  [phone]        Hospitalist Progress Note      NAME: Meghan [name]   DOB:  06-Nov-1945  MRM:  [id number]    Date/Time of service: 06/07/2022  12:30 PM       Subjective:     Chief Complaint:  Patient was personally seen and examined by me during this time period.  Chart reviewed.  Resting.  No chest pain, SOB, fevers, chills        Objective:       Vitals:       Last 24hrs VS reviewed since prior progress note. Most recent are:    Vitals:    06/07/22 1152   BP: 115/60   Pulse: 85   Resp: 18   Temp: 99 F (37.2 C)   SpO2: 95%     SpO2 Readings from Last 6 Encounters:   06/07/22 95%   03/25/22 94%   03/25/22 99%          Intake/Output Summary (Last 24 hours) at 06/07/2022 1230  Last data filed at 06/07/2022 0916  Gross per 24 hour   Intake 400 ml   Output 700 ml   Net -300 ml          Exam:     Physical Exam:    Gen:  elderly, frail, ill-appearing, NAD  HEENT:  Pink conjunctivae, PERRL, hearing intact to voice, moist mucous membranes  Neck:  Supple, without masses, thyroid non-tender  Resp:  No accessory muscle use, clear breath sounds without wheezes rales or rhonchi  Card:  No murmurs, normal S1, S2 without thrills, bruits or peripheral edema  Abd:  Soft, non-tender, non-distended, normoactive bowel sounds are present  Musc:  No cyanosis or clubbing.  External fixation present   Skin:  wound at fracture site,  see wound care note   Neuro:  moves all ext, generalized weakness  Psych:  poor insight, oriented to person    Medications Reviewed: (see below)    Lab Data Reviewed: (see below)    ______________________________________________________________________    Medications:     Current Facility-Administered Medications   Medication Dose Route Frequency    HYDROmorphone (DILAUDID) injection 0.5 mg  0.5 mg IntraVENous Q4H PRN    lisinopril (PRINIVIL;ZESTRIL) tablet 10 mg  10 mg Oral Daily    acidophilus probiotic capsule 1  capsule  1 capsule Oral Daily    divalproex (DEPAKOTE) DR tablet 500 mg  500 mg Oral 2 times per day    lacosamide (VIMPAT) tablet 100 mg  100 mg Oral BID    sodium chloride flush 0.9 % injection 5-40 mL  5-40 mL IntraVENous 2 times per day    sodium chloride flush 0.9 % injection 5-40 mL  5-40 mL IntraVENous PRN    0.9 % sodium chloride infusion   IntraVENous PRN    ondansetron (ZOFRAN-ODT) disintegrating tablet 4 mg  4 mg Oral Q8H PRN    Or    ondansetron (ZOFRAN) injection 4 mg  4 mg IntraVENous Q6H PRN    enoxaparin Sodium (LOVENOX) injection 30 mg  30 mg SubCUTAneous BID    HYDROcodone-acetaminophen (NORCO) 5-325 MG per tablet 1 tablet  1 tablet Oral Q4H PRN    hydrALAZINE (APRESOLINE) injection 10 mg  10 mg IntraVENous Q6H PRN    bupivacaine (PF) (MARCAINE) 0.5 % injection 150 mg  30 mL IntraDERmal Once  iopamidol (ISOVUE-370) 76 % injection 100 mL  100 mL IntraVENous ONCE PRN    glucose chewable tablet 16 g  4 tablet Oral PRN    dextrose bolus 10% 125 mL  125 mL IntraVENous PRN    Or    dextrose bolus 10% 250 mL  250 mL IntraVENous PRN    dextrose 10 % infusion   IntraVENous Continuous PRN    naloxone (NARCAN) injection 0.4 mg  0.4 mg IntraVENous PRN    sodium chloride flush 0.9 % injection 5-40 mL  5-40 mL IntraVENous 2 times per day    0.9 % sodium chloride infusion   IntraVENous PRN    polyethylene glycol (GLYCOLAX) packet 17 g  17 g Oral Daily PRN    acetaminophen (TYLENOL) tablet 650 mg  650 mg Oral Q6H PRN    Or    acetaminophen (TYLENOL) suppository 650 mg  650 mg Rectal Q6H PRN    dextrose bolus 10% 125 mL  125 mL IntraVENous PRN    Or    dextrose bolus 10% 250 mL  250 mL IntraVENous PRN    glucagon injection 1 mg  1 mg SubCUTAneous PRN    dextrose 10 % infusion   IntraVENous Continuous PRN    aspirin chewable tablet 81 mg  81 mg Oral Daily    citalopram (CELEXA) tablet 10 mg  10 mg Oral Daily    donepezil (ARICEPT) tablet 10 mg  10 mg Oral Nightly    memantine (NAMENDA) tablet 10 mg  10 mg Oral BID     rOPINIRole (REQUIP) tablet 1 mg  1 mg Oral Nightly          Lab Review:     Recent Labs     06/05/22  0230   WBC 10.4   HGB 11.2*   HCT 35.5   PLT 832*       Recent Labs     06/05/22  0230   NA 143   K 3.9   CL 110*   CO2 29   BUN 18   MG 1.9   PHOS 3.2       No results found for: "GLUCPOC"       Assessment / Plan:     76 yo hx of HTN, Parkinson's dementia, seizure d/o, presented w/ AMS, hypoglycemia, hypotension.  Hospital course complicated by fall, R ankle fracture s/p external fixation 10/16    1) R ankle fracture/pain: due to GLF.  S/p external fixation by Dr. Dema Severin on 10/16.  S/p bedside debridement of blister on 10/20.  Cont wound care.  Will cont IV dilaudid prn severe pain.  Cont PT/OT.  Defer to Ortho regarding timing of ORIF, then to rehab     2) Acute met encephalopathy: now stable.  Likely from hypoglycemia, hypotension.  EEG neg for seizures.  Head MRI unremarkable.  Will monitor for agitation    3) Hypotension/hypoglycemia: now resolved.  ACTH stim test neg for adrenal insuff.  Endocrine was following    4) AKI: resolved with IVF    5) HTN: BP stable.  Cont lisinopril.  Use IV hydralazine prn    6) DM type 2 w/ diabetic neuropathy: A1C 5.6%.  Off insulin due to hypoglycemia.  Diet control     7) Thrombocytosis: likely due to fracture.  No further workup.  Will monitor prn    8) Parkinson's dementia/depression seems to be at baseline.  Cont Namenda, Aricept, celexa     9) Seizure d/o: EEG  neg.  Cont Vimpat, depakote    **Prior records, notes, labs, radiology, and medications reviewed in [facility]**    Total time spent with patient care: 30 Minutes **I personally saw and examined the patient during this time period**                 Care Plan discussed with: Patient, nursing, CM    Discussed:  Care Plan    Prophylaxis:  Lovenox    Disposition:  SNF/LTC/ after ORIF.  Unknown timing at this point            ___________________________________________________    Attending Physician: Pura Spice, MD

## 2022-06-07 NOTE — Wound Image (Signed)
Wound Consult:  follow up Visit.   Spoke with patients nurse,  Jimmye Norman.  Patient is sitting in chair with right leg elevated  Patient is awake, alert, pre-medicated for pain  Assessment:  Right ankle with ankle hardware intact, nearly circumferential unroofed blister, moist  to with areas of healing.no surrounding redness, clean pin sites, care given    Right lateral ankle- serous filled blister 3x3cm      Treatment:  Right ankle- cleansed with NSS, hydrafera blue, ABD's 4x4s and rolled gauze Ace wrap.  Ortho PA at bedside for assessment  Wound Recommendations:  Continue current treatment  Plan:  Ortho PA at bedside for dressing change  Will follow  [facility], Wound / [place] [phone]

## 2022-06-07 NOTE — Progress Notes (Signed)
Orthopaedic Progress Note  Post Op day: 9 Days Post-Op    June 07, 2022 2:37 PM     Patient: Meghan [name] MRN: [id number]  SSN: [id number]    Date of Birth: 1946/04/10  Age: 76 y.o.  Sex: female      Admit date:  05/23/2022  Date of Surgery:  @ORDATE @   Procedures:  Procedure(s):  ANKLE EXTERNAL FIXATOR APPLICATION RIGHT  Admitting Physician:  Haskel Schroeder, DO   Surgeon:  Juliann Mule) and Role:     * Beola Cord., MD - Primary    Consulting Physician(s): Treatment Team: Attending Provider: Brynda Greathouse, MD; Consulting Physician: Emelda Brothers, MD; Case Manager: Justice Deeds; Consulting Physician: Cathlyn Parsons, MD; Consulting Physician: Leron Croak, APRN - NP; Consulting Physician: Kathy Breach, MD; Surgeon: Beola Cord., MD; Patient Care Tech: Mart Piggs; Patient Care Tech: Brynda Greathouse; Registered Nurse: Rudene Re, RN; Patient Care Tech: Tharon Aquas; Occupational Therapist: Wadie Lessen, OT; Physical Therapist: Cassell Smiles, PT    SUBJECTIVE:     Meghan [name] is a 76 y.o. female is 9 Days Post-Op s/p Procedure(s):  ANKLE EXTERNAL FIXATOR APPLICATION RIGHT with an appropriate level of post-operative pain.  No complaints of nausea, vomiting, dizziness, lightheadedness, chest pain, or shortness of breath.    OBJECTIVE:       Physical Exam:  General: Alert, cooperative, no distress.    Respiratory: Respirations unlabored  Neurological:  Neurovascular exam within normal limits.  Motor: + DF/PF.   Musculoskeletal: Right ankle with ex intact. DP PT +2. Wiggles toes without diff. (See wound care pics from today). Near circumferential unroofed blister with areas of healing. No surrounding redness, clean pin sites. Lateral ankle with 3x3 blister that was drained by Olin Pia wound care nurse  Calves soft, supple, non-tender upon palpation.  DP/ PT +2. SILT with BCR in toes    Vital Signs:      Patient Vitals for the past 8 hrs:   BP Temp Temp src Pulse  Resp SpO2   06/07/22 1152 115/60 99 F (37.2 C) Oral 85 18 95 %   06/07/22 0849 126/69 98.4 F (36.9 C) Oral 83 18 98 %                                          Temp (24hrs), Avg:98.5 F (36.9 C), Min:97.3 F (36.3 C), Max:99 F (37.2 C)      Labs:        Recent Labs     06/05/22  0230   HCT 35.5   HGB 11.2*     Lab Results   Component Value Date/Time    NA 143 06/05/2022 02:30 AM    K 3.9 06/05/2022 02:30 AM    CL 110 06/05/2022 02:30 AM    CO2 29 06/05/2022 02:30 AM    BUN 18 06/05/2022 02:30 AM       PT/OT:                Patient mobility                         ASSESSMENT / PLAN:   Principal Problem:    Encephalopathy  Active Problems:    AKI (acute kidney injury) ([facility])    Hypoglycemia    Stroke (cerebrum) (HCC)    Complex partial  seizure evolving to generalized seizure (HCC)    Dementia without behavioral disturbance (HCC)    HLD (hyperlipidemia)    Type 2 diabetes mellitus with diabetic neuropathy (HCC)    Closed fracture dislocation of ankle joint, right, initial encounter  Resolved Problems:    * No resolved hospital problems. *          -  -  R trimal fracture s/p Ex Fix placement - pt was seen by wound care this AM with plan for wound care MWF with Saline moistened hydrofera blue to open blister, Dry gauze/ABD/kerlex/ace, continue daily pin care with saline and xeroform around pin sites and leave the pin care sites accessible when wrapping blisters, pin to be completed today per RN  - NWB RLE with PT/OT, ice and elevate ono 2-3 pillows at all times,  pain management as per orders   - Lovenox daily for the next 30 days   - per discussion with CM and Dr. Drucilla Schmidt the plan is to keep the pt until ORIF. Surgery will NOT be preformed tomorrow due to skin integrity issues. If wounds continue to heal, hopefully surgery can be completed next Thursday 06/15/22.      Discussed with Dr. Darnelle Spangle and he agrees with above.          Signed By:  Juanell Fairly, APRN - NP    Orthopedic Surgery   [facility]

## 2022-06-08 LAB — BASIC METABOLIC PANEL
Anion Gap: 4 mmol/L — ABNORMAL LOW (ref 5–15)
BUN: 14 MG/DL (ref 6–20)
Bun/Cre Ratio: 16 (ref 12–20)
CO2: 32 mmol/L (ref 21–32)
Calcium: 9 MG/DL (ref 8.5–10.1)
Chloride: 107 mmol/L (ref 97–108)
Creatinine: 0.89 MG/DL (ref 0.55–1.02)
Est, Glom Filt Rate: >60 mL/min/{1.73_m2} (ref >60)
Glucose: 85 mg/dL (ref 65–100)
Potassium: 3.8 mmol/L (ref 3.5–5.1)
Sodium: 143 mmol/L (ref 136–145)

## 2022-06-08 LAB — CBC
Hematocrit: 38.2 % (ref 35.0–47.0)
Hemoglobin: 12.2 g/dL (ref 11.5–16.0)
MCH: 30.2 PG (ref 26.0–34.0)
MCHC: 31.9 g/dL (ref 30.0–36.5)
MCV: 94.6 FL (ref 80.0–99.0)
MPV: 10.4 FL (ref 8.9–12.9)
Nucleated RBCs: 0 PER 100 WBC
Platelets: 946 10*3/uL — ABNORMAL HIGH (ref 150–400)
RBC: 4.04 M/uL (ref 3.80–5.20)
RDW: 15.6 % — ABNORMAL HIGH (ref 11.5–14.5)
WBC: 8.5 10*3/uL (ref 3.6–11.0)
nRBC: 0 10*3/uL (ref 0.00–0.01)

## 2022-06-08 LAB — C-REACTIVE PROTEIN: CRP: 0.63 mg/dL — ABNORMAL HIGH (ref 0.00–0.60)

## 2022-06-08 LAB — MAGNESIUM: Magnesium: 1.8 mg/dL (ref 1.6–2.4)

## 2022-06-08 LAB — PHOSPHORUS: Phosphorus: 2.9 MG/DL (ref 2.6–4.7)

## 2022-06-08 NOTE — Progress Notes (Addendum)
ADDENDUM:  Spoke with Dr. Dema Severin.  He would like to have the patient remain inpatient for wound care until Tuesday of next week.  We both agree this is the best option for this patient especially with the severity of her blisters.      For next week: We will re-evaluate the wounds and determine timing for surgery at that point.       Please Perfectserve with questions regarding this.       San Morelle, PA-C  Orthopaedic Surgery PA  [facility]

## 2022-06-08 NOTE — Progress Notes (Signed)
Hospitalist Progress Note      NAME:  Meghan [name]   DOB:  1945/08/26  MRM:  [id number]    Date/Time: 06/08/2022  1:31 PM           Assessment / Plan:     76 yo hx of HTN, Parkinson's dementia, seizure d/o, presented w/ AMS, hypoglycemia, hypotension.  Hospital course complicated by fall, R ankle fracture s/p external fixation 10/16     #R ankle fracture/pain: due to GLF.  S/p external fixation by Dr. Dema Severin on 10/16.    S/p bedside debridement of blister on 10/20.    Cont wound care.    Will cont IV dilaudid prn severe pain.    Cont PT/OT.    Defer to Ortho regarding timing of ORIF, surgery can be completed next Thursday 06/15/22.    Patient has been accepted at rehab, due to concerns of poor follow up and return from rehab to get surgery done, patient is in house until transports are arranged      #Acute met encephalopathy: now stable.  Likely from hypoglycemia, hypotension.    EEG neg for seizures.    Head MRI unremarkable.    Will monitor for agitation     #Hypotension/hypoglycemia: now resolved.    ACTH stim test neg for adrenal insuff.    Endocrine was following     #AKI: resolved with IVF     #HTN: BP stable.  Cont lisinopril.  Use IV hydralazine prn     # DM type 2 w/ diabetic neuropathy: A1C 5.6%.  Off insulin due to hypoglycemia.  Diet control      #Thrombocytosis: likely due to fracture.  No further workup.  Will monitor prn     #Parkinson's dementia/depression seems to be at baseline.  Cont Namenda, Aricept, celexa      #Seizure d/o: EEG neg.  Cont Vimpat, depakote       I have personally reviewed the radiographs, laboratory data in Epic and decisions and statements above are based partially on this personal interpretation.                 Care Plan discussed with: Patient    Discussed:  Care Plan    Prophylaxis:  Lovenox    Disposition:  SNF/LTC           ___________________________________________________    Attending Physician: Bufford Buttner, MD        Subjective:     Chief Complaint:  R ankle  fracture    ROS:  (bold if positive, if negative)    Tolerating PT  Tolerating Diet          Objective:     Addressed to the patient the plan, being in house vs following up outpatient   Vitals:          Last 24hrs VS reviewed since prior progress note. Most recent are:    Vitals:    06/08/22 1143   BP: 114/64   Pulse: 83   Resp: 18   Temp: 98.1 F (36.7 C)   SpO2: 98%     SpO2 Readings from Last 6 Encounters:   06/08/22 98%   03/25/22 94%   03/25/22 99%          Intake/Output Summary (Last 24 hours) at 06/08/2022 1331  Last data filed at 06/07/2022 2159  Gross per 24 hour   Intake 360 ml   Output 350 ml   Net 10 ml  Exam:     Physical Exam:    Gen:  elderly, frail, ill-appearing, NAD  HEENT:  Pink conjunctivae, PERRL, hearing intact to voice, moist mucous membranes  Neck:  Supple, without masses, thyroid non-tender  Resp:  No accessory muscle use, clear breath sounds without wheezes rales or rhonchi  Card:  No murmurs, normal S1, S2 without thrills, bruits or peripheral edema  Abd:  Soft, non-tender, non-distended, normoactive bowel sounds are present  Musc:  No cyanosis or clubbing.  External fixation present   Skin:  wound at fracture site,  see wound care note   Neuro:  moves all ext, generalized weakness  Psych:  poor insight, oriented to person      Medications Reviewed: (see below)    Lab Data Reviewed: (see below)    ______________________________________________________________________    Medications:     Current Facility-Administered Medications   Medication Dose Route Frequency    HYDROmorphone (DILAUDID) injection 0.5 mg  0.5 mg IntraVENous Q4H PRN    lisinopril (PRINIVIL;ZESTRIL) tablet 10 mg  10 mg Oral Daily    acidophilus probiotic capsule 1 capsule  1 capsule Oral Daily    divalproex (DEPAKOTE) DR tablet 500 mg  500 mg Oral 2 times per day    lacosamide (VIMPAT) tablet 100 mg  100 mg Oral BID    sodium chloride flush 0.9 % injection 5-40 mL  5-40 mL IntraVENous 2 times per day    sodium  chloride flush 0.9 % injection 5-40 mL  5-40 mL IntraVENous PRN    0.9 % sodium chloride infusion   IntraVENous PRN    ondansetron (ZOFRAN-ODT) disintegrating tablet 4 mg  4 mg Oral Q8H PRN    Or    ondansetron (ZOFRAN) injection 4 mg  4 mg IntraVENous Q6H PRN    enoxaparin Sodium (LOVENOX) injection 30 mg  30 mg SubCUTAneous BID    HYDROcodone-acetaminophen (NORCO) 5-325 MG per tablet 1 tablet  1 tablet Oral Q4H PRN    hydrALAZINE (APRESOLINE) injection 10 mg  10 mg IntraVENous Q6H PRN    bupivacaine (PF) (MARCAINE) 0.5 % injection 150 mg  30 mL IntraDERmal Once    iopamidol (ISOVUE-370) 76 % injection 100 mL  100 mL IntraVENous ONCE PRN    glucose chewable tablet 16 g  4 tablet Oral PRN    dextrose bolus 10% 125 mL  125 mL IntraVENous PRN    Or    dextrose bolus 10% 250 mL  250 mL IntraVENous PRN    dextrose 10 % infusion   IntraVENous Continuous PRN    naloxone (NARCAN) injection 0.4 mg  0.4 mg IntraVENous PRN    sodium chloride flush 0.9 % injection 5-40 mL  5-40 mL IntraVENous 2 times per day    0.9 % sodium chloride infusion   IntraVENous PRN    polyethylene glycol (GLYCOLAX) packet 17 g  17 g Oral Daily PRN    acetaminophen (TYLENOL) tablet 650 mg  650 mg Oral Q6H PRN    Or    acetaminophen (TYLENOL) suppository 650 mg  650 mg Rectal Q6H PRN    dextrose bolus 10% 125 mL  125 mL IntraVENous PRN    Or    dextrose bolus 10% 250 mL  250 mL IntraVENous PRN    glucagon injection 1 mg  1 mg SubCUTAneous PRN    dextrose 10 % infusion   IntraVENous Continuous PRN    aspirin chewable tablet 81 mg  81 mg Oral Daily  citalopram (CELEXA) tablet 10 mg  10 mg Oral Daily    donepezil (ARICEPT) tablet 10 mg  10 mg Oral Nightly    memantine (NAMENDA) tablet 10 mg  10 mg Oral BID    rOPINIRole (REQUIP) tablet 1 mg  1 mg Oral Nightly            Lab Review:     Recent Labs     06/08/22  0825   WBC 8.5   HGB 12.2   HCT 38.2   PLT 946*     Recent Labs     06/08/22  0825   NA 143   K 3.8   CL 107   CO2 32   BUN 14   MG 1.8   PHOS  2.9     No components found for: "GLPOC"

## 2022-06-08 NOTE — Plan of Care (Signed)
Problem: Physical Therapy - Adult  Goal: By Discharge: Performs mobility at highest level of function for planned discharge setting.  See evaluation for individualized goals.  Description: Physical Therapy Goals  Revised 05/31/2022  1.  Patient will move from supine to sit and sit to supine  in bed with minimal assistance/contact guard assist within 7 day(s).  2.  Patient will do lateral transfer from bed to chair and chair to bed with minimal assistance/contact guard assist using the least restrictive device within 7 day(s).  3.  Patient will perform sit to stand with minimal assistance/contact guard assist within 7 day(s).  4.  Patient will ambulate with minimal assistance/contact guard assist for 5x2 feet with the least restrictive device within 7 day(s).        FUNCTIONAL STATUS PRIOR TO ADMISSION: Patient was modified independent using a rolling walker for functional mobility. The patient lives in [place] and is in town visiting her sister who lives locally.     HOME SUPPORT PRIOR TO ADMISSION: The patient lived with her significant other in [place] but did not require assistance. She reports 2 falls in the past month.     Physical Therapy Goals  Initiated 05/24/2022  1.  Patient will move from supine to sit and sit to supine, scoot up and down, and roll side to side in bed with independence within 7 day(s).    2.  Patient will perform sit to stand with contact guard assist within 7 day(s).  3.  Patient will transfer from bed to chair and chair to bed with supervision/set-up using the least restrictive device within 7 day(s).  4.  Patient will ambulate with supervision/set-up for 50 feet with the least restrictive device within 7 day(s).   5.  Patient will ascend/descend 4 stairs with 1-2 handrail(s) with contact guard assist within 7 day(s).    Outcome: Progressing   PHYSICAL THERAPY TREATMENT    Patient: Meghan [name] (76 y.o. female)  Date: 06/08/2022  Diagnosis: Encephalopathy [G93.40]  Acute encephalopathy  [G93.40]  Closed head injury, initial encounter [S09.90XA]  Unwitnessed fall [R29.6] Encephalopathy  Procedure(s) (LRB):  ANKLE EXTERNAL FIXATOR APPLICATION RIGHT (Right) 10 Days Post-Op  Precautions: Weight Bearing, Fall Risk (NWB RLE) Right Lower Extremity Weight Bearing: Non Weight Bearing                  ASSESSMENT:  Patient continues to benefit from skilled PT services and is slowly progressing towards goals. She is able to maintain NWB during functional mobility but requires heavy cuing, increased time, and up to moderate assist to sequence and complete functional activity. Reviewed right NWB and demonstrated technique for a lateral transfer. She demonstrated the ability to maintain right NWB while scooting to her right but lateral motion was in miniscule increments and continuous cues were required to maintain an anterior weight shift, to WB on the LLE, and to continue until fully seated in a bedside recliner. She has a pending surgery next week 11/2 to fix her trimalleolar ankle fracture and will work to improve independence with mobility while maintaining prescribed WB until that time.         PLAN:  Patient continues to benefit from skilled intervention to address the above impairments.  Continue treatment per established plan of care.    Recommendation for discharge: (in order for the patient to meet his/her long term goals): Therapy up to 5 days/week in Skilled nursing facility    Other factors to consider for discharge: impaired cognition  and high risk for falls    IF patient discharges home will need the following DME: continuing to assess with progress       SUBJECTIVE:   Patient stated, "Now why do I need to do this?"    OBJECTIVE DATA SUMMARY:   Critical Behavior:          Functional Mobility Training:  Bed Mobility:  Bed Mobility Training  Interventions: Safety awareness training;Weight shifting training/pressure relief;Verbal cues  Supine to Sit: Minimum assistance;Additional time  Scooting:  Minimum assistance;Adaptive equipment  Transfers:  Transfer Training  Interventions: Verbal cues;Visual cues;Tactile cues;Demonstration  Bed to Chair: Moderate assistance;Additional time  Balance:  Balance  Sitting: Intact  Sitting - Static: Good (unsupported)  Sitting - Dynamic: Good (unsupported)   Ambulation/Gait Training:        Neuro Re-Education:              Pain Rating:  4/10 in right LE while in dependent position  Pain Intervention(s):   nursing notified, rest, and elevation    Activity Tolerance:   Good    After treatment:   Patient left in no apparent distress sitting up in chair and Call bell within reach      COMMUNICATION/EDUCATION:   The patient's plan of care was discussed with: registered nurse           Cathie Olden, PT  Minutes: [phone]

## 2022-06-08 NOTE — Care Coordination-Inpatient (Incomplete)
Transition of Care Plan to SNF/Rehab    Communication to Patient/Family:  Met with patient and family and they are agreeable to the transition plan. The Plan for Transition of Care is related to the following treatment goals: SNF     The Patient and/or patient representative was provided with a choice of provider and agrees  with the discharge plan.      Yes _0  No _1     A Freedom of choice list was provided with basic dialogue that supports the patient's individualized plan of care/goals and shares the quality data associated with the providers.       Yes _2  No _3     SNF/Rehab Transition:  Patient has been accepted to [facility] SNF/Rehab and meets criteria for admission.   Patient will transported by [facility] and expected to leave at 1pm    Communication to SNF/Rehab:  Bedside RN, has been notified to update the transition plan to the facility and call report: [phone]  Room 80A        Discharge information has been updated on the AVS. And communicated to facility via [employer], or CC link.           Nursing Please include all hard scripts for controlled substances, med rec and dc summary, and AVS in packet.     Reviewed and confirmed with facility, [facility]  can manage the patient care needs for the following:     [place] with (X) only those applicable:  Medication:  _4 Medications are available at the facility  _5 IV Antibiotics    _6 Controlled Substance - hard copies available sent.  _7 Weekly Labs    Equipment:  _8 CPAP/BiPAP  _9 Wound Vacuum  _10 Foley or Urinary Device  _11 PICC/Central Line  _12 Nebulizer  _13 Ventilator    Treatment:  _14 Isolation (for MRSA, VRE, etc.)  _15 Surgical Drain Management  _16 Tracheostomy Care  _17 Dressing Changes  _18 Dialysis with transportation  _19 PEG Care  _20 Oxygen  _21 Daily Weights for Heart Failure    Dietary:  _22 Any diet limitations  _23 Tube Feedings   _24 Total Parenteral Management (TPN)    Financial Resources:  <LOVFIEPPIRJJOACZ>_6<\/SAYTKZSWFUXNATFT>_73 Medicaid Application Completed    _26 UAI Completed and  copy given to pt/family  and copy given to pt/family  _27 A screening has previously been completed.    _28 Level II Completed    _29  Private pay individual who will not become   financially eligible for Medicaid within 6 months from admission to a [place] facility.     _30  Individual refused to have screening conducted.     <UKGURKYHCWCBJSEG>_3<\/TDVVOHYWVPXTGGYI>_94 Medicaid Application Completed    _32 The screening denied because it was determined individual did not need/did not qualify for nursing facility level of care.  _33  Out of state residents seeking direct admission to a [place] facility.  _34  Individuals who are inpatients of an out of state hospital, or in state or out of state veterans/Military hospital and seek direct admission to a [place] facility  _35  Individuals who are pateints or residents of a state owned/operated facility that is licensed by Department of [employer] ([employer]) and seek direct admission to [place] facility  _36  A screening not required for enrollment in [facility] services as set out in 12 VAC [id number]  _37  [facility]) staff shall perform screenings of the [facility] clients.    Advanced Care Plan:  _38 Surrogate Decision Maker of Care  _39 POA  _40 Communicated Code Status and copy sent.    Other:  Florene Glen, [place], [place]  [phone]

## 2022-06-08 NOTE — Care Coordination-Inpatient (Signed)
3:15pm: CM notified that Pt has not been cleared for discharge today. CM will continue to follow for discharge planning needs.       9:15am: CM received notification from [facility] that they are able to accept Pt. CM was provided the following information:       call report: [phone]  [place], [place], [place]  [phone]

## 2022-06-09 NOTE — Progress Notes (Signed)
Hospitalist Progress Note      NAME:  Meghan [name]   DOB:  1946-06-03  MRM:  [id number]    Date/Time: 06/09/2022  1:44 PM           Assessment / Plan:     76 yo hx of HTN, Parkinson's dementia, seizure d/o, presented w/ AMS, hypoglycemia, hypotension.  Hospital course complicated by fall, R ankle fracture s/p external fixation 10/16     #R ankle fracture/pain: due to GLF.  S/p external fixation by Dr. Dema Severin on 10/16.    S/p bedside debridement of blister on 10/20.    Cont wound care.    Will cont IV dilaudid prn severe pain.    Cont PT/OT.    Defer to Ortho regarding timing of ORIF, surgery can be completed next Thursday 06/15/22.    Dr. Dema Severin.  He would like to have the patient remain inpatient for wound care until Tuesday of next week.  We both agree this is the best option for this patient especially with the severity of her blisters.       For next week: We will re-evaluate the wounds and determine timing for surgery at that point.      #Acute met encephalopathy: now stable.  Likely from hypoglycemia, hypotension.    EEG neg for seizures.    Head MRI unremarkable.    Will monitor for agitation     #Hypotension/hypoglycemia: now resolved.    ACTH stim test neg for adrenal insuff.    Endocrine was following     #AKI: resolved with IVF     #HTN: BP stable.  Cont lisinopril.  Use IV hydralazine prn     # DM type 2 w/ diabetic neuropathy: A1C 5.6%.  Off insulin due to hypoglycemia.  Diet control      #Thrombocytosis: likely due to fracture.  No further workup.  Will monitor prn     #Parkinson's dementia/depression seems to be at baseline.  Cont Namenda, Aricept, celexa      #Seizure d/o: EEG neg.  Cont Vimpat, depakote       I have personally reviewed the radiographs, laboratory data in Epic and decisions and statements above are based partially on this personal interpretation.                 Care Plan discussed with: Patient    Discussed:  Care Plan    Prophylaxis:  Lovenox    Disposition:   SNF/LTC           ___________________________________________________    Attending Physician: Bufford Buttner, MD        Subjective:     Chief Complaint:  R ankle fracture    ROS:  (bold if positive, if negative)    Tolerating PT  Tolerating Diet          Objective:     Addressed to the patient the plan, being in house   Vitals:          Last 24hrs VS reviewed since prior progress note. Most recent are:    Vitals:    06/09/22 1204   BP: 102/63   Pulse: 89   Resp: 16   Temp: 99.3 F (37.4 C)   SpO2: 95%     SpO2 Readings from Last 6 Encounters:   06/09/22 95%   03/25/22 94%   03/25/22 99%          Intake/Output Summary (Last 24 hours) at 06/09/2022 1344  Last data filed  at 06/09/2022 1336  Gross per 24 hour   Intake 360 ml   Output 200 ml   Net 160 ml            Exam:     Physical Exam:    Gen:  elderly, frail, ill-appearing, NAD  HEENT:  Pink conjunctivae, PERRL, hearing intact to voice, moist mucous membranes  Neck:  Supple, without masses, thyroid non-tender  Resp:  No accessory muscle use, clear breath sounds without wheezes rales or rhonchi  Card:  No murmurs, normal S1, S2 without thrills, bruits or peripheral edema  Abd:  Soft, non-tender, non-distended, normoactive bowel sounds are present  Musc:  No cyanosis or clubbing.  External fixation present   Skin:  wound at fracture site,  see wound care note   Neuro:  moves all ext, generalized weakness  Psych:  poor insight, oriented to person      Medications Reviewed: (see below)    Lab Data Reviewed: (see below)    ______________________________________________________________________    Medications:     Current Facility-Administered Medications   Medication Dose Route Frequency    HYDROmorphone (DILAUDID) injection 0.5 mg  0.5 mg IntraVENous Q4H PRN    lisinopril (PRINIVIL;ZESTRIL) tablet 10 mg  10 mg Oral Daily    acidophilus probiotic capsule 1 capsule  1 capsule Oral Daily    divalproex (DEPAKOTE) DR tablet 500 mg  500 mg Oral 2 times per day    lacosamide  (VIMPAT) tablet 100 mg  100 mg Oral BID    sodium chloride flush 0.9 % injection 5-40 mL  5-40 mL IntraVENous 2 times per day    sodium chloride flush 0.9 % injection 5-40 mL  5-40 mL IntraVENous PRN    0.9 % sodium chloride infusion   IntraVENous PRN    ondansetron (ZOFRAN-ODT) disintegrating tablet 4 mg  4 mg Oral Q8H PRN    Or    ondansetron (ZOFRAN) injection 4 mg  4 mg IntraVENous Q6H PRN    enoxaparin Sodium (LOVENOX) injection 30 mg  30 mg SubCUTAneous BID    HYDROcodone-acetaminophen (NORCO) 5-325 MG per tablet 1 tablet  1 tablet Oral Q4H PRN    hydrALAZINE (APRESOLINE) injection 10 mg  10 mg IntraVENous Q6H PRN    bupivacaine (PF) (MARCAINE) 0.5 % injection 150 mg  30 mL IntraDERmal Once    iopamidol (ISOVUE-370) 76 % injection 100 mL  100 mL IntraVENous ONCE PRN    glucose chewable tablet 16 g  4 tablet Oral PRN    dextrose bolus 10% 125 mL  125 mL IntraVENous PRN    Or    dextrose bolus 10% 250 mL  250 mL IntraVENous PRN    dextrose 10 % infusion   IntraVENous Continuous PRN    naloxone (NARCAN) injection 0.4 mg  0.4 mg IntraVENous PRN    sodium chloride flush 0.9 % injection 5-40 mL  5-40 mL IntraVENous 2 times per day    0.9 % sodium chloride infusion   IntraVENous PRN    polyethylene glycol (GLYCOLAX) packet 17 g  17 g Oral Daily PRN    acetaminophen (TYLENOL) tablet 650 mg  650 mg Oral Q6H PRN    Or    acetaminophen (TYLENOL) suppository 650 mg  650 mg Rectal Q6H PRN    dextrose bolus 10% 125 mL  125 mL IntraVENous PRN    Or    dextrose bolus 10% 250 mL  250 mL IntraVENous PRN    glucagon injection 1  mg  1 mg SubCUTAneous PRN    dextrose 10 % infusion   IntraVENous Continuous PRN    aspirin chewable tablet 81 mg  81 mg Oral Daily    citalopram (CELEXA) tablet 10 mg  10 mg Oral Daily    donepezil (ARICEPT) tablet 10 mg  10 mg Oral Nightly    memantine (NAMENDA) tablet 10 mg  10 mg Oral BID    rOPINIRole (REQUIP) tablet 1 mg  1 mg Oral Nightly            Lab Review:     Recent Labs     06/08/22  0825    WBC 8.5   HGB 12.2   HCT 38.2   PLT 946*       Recent Labs     06/08/22  0825   NA 143   K 3.8   CL 107   CO2 32   BUN 14   MG 1.8   PHOS 2.9       No components found for: "GLPOC"

## 2022-06-09 NOTE — Plan of Care (Signed)
Problem: Skin/Tissue Integrity  Goal: Absence of new skin breakdown  Description: 1.  Monitor for areas of redness and/or skin breakdown  2.  Assess vascular access sites hourly  3.  Every 4-6 hours minimum:  Change oxygen saturation probe site  4.  Every 4-6 hours:  If on nasal continuous positive airway pressure, respiratory therapy assess nares and determine need for appliance change or resting period.  Outcome: Progressing     Problem: Safety - Adult  Goal: Free from fall injury  Outcome: Progressing     Problem: Physical Therapy - Adult  Goal: By Discharge: Performs mobility at highest level of function for planned discharge setting.  See evaluation for individualized goals.  Description: Physical Therapy Goals  Revised 05/31/2022  1.  Patient will move from supine to sit and sit to supine  in bed with minimal assistance/contact guard assist within 7 day(s).  2.  Patient will do lateral transfer from bed to chair and chair to bed with minimal assistance/contact guard assist using the least restrictive device within 7 day(s).  3.  Patient will perform sit to stand with minimal assistance/contact guard assist within 7 day(s).  4.  Patient will ambulate with minimal assistance/contact guard assist for 5x2 feet with the least restrictive device within 7 day(s).        FUNCTIONAL STATUS PRIOR TO ADMISSION: Patient was modified independent using a rolling walker for functional mobility. The patient lives in [place] and is in town visiting her sister who lives locally.     HOME SUPPORT PRIOR TO ADMISSION: The patient lived with her significant other in [place] but did not require assistance. She reports 2 falls in the past month.     Physical Therapy Goals  Initiated 05/24/2022  1.  Patient will move from supine to sit and sit to supine, scoot up and down, and roll side to side in bed with independence within 7 day(s).    2.  Patient will perform sit to stand with contact guard assist within 7 day(s).  3.  Patient will  transfer from bed to chair and chair to bed with supervision/set-up using the least restrictive device within 7 day(s).  4.  Patient will ambulate with supervision/set-up for 50 feet with the least restrictive device within 7 day(s).   5.  Patient will ascend/descend 4 stairs with 1-2 handrail(s) with contact guard assist within 7 day(s).    06/08/2022 1424 by Cathie Olden, PT  Outcome: Progressing     Problem: Chronic Conditions and Co-morbidities  Goal: Patient's chronic conditions and co-morbidity symptoms are monitored and maintained or improved  Outcome: Progressing  Flowsheets (Taken 06/08/2022 1600 by Altamese Cabal, RN)  Care Plan - Patient's Chronic Conditions and Co-Morbidity Symptoms are Monitored and Maintained or Improved:   Monitor and assess patient's chronic conditions and comorbid symptoms for stability, deterioration, or improvement   Update acute care plan with appropriate goals if chronic or comorbid symptoms are exacerbated and prevent overall improvement and discharge   Collaborate with multidisciplinary team to address chronic and comorbid conditions and prevent exacerbation or deterioration     Problem: Discharge Planning  Goal: Discharge to home or other facility with appropriate resources  Outcome: Progressing  Flowsheets (Taken 06/08/2022 1600 by Altamese Cabal, RN)  Discharge to home or other facility with appropriate resources:   Identify barriers to discharge with patient and caregiver   Identify discharge learning needs (meds, wound care, etc)     Problem: Skin/Tissue Integrity - Adult  Goal: Skin integrity remains intact  Outcome: Progressing  Flowsheets (Taken 06/08/2022 1600 by Jaye Beagle, RN)  Skin Integrity Remains Intact:   Monitor for areas of redness and/or skin breakdown   Assess vascular access sites hourly     Problem: Musculoskeletal - Adult  Goal: Return mobility to safest level of function  Outcome: Progressing     Problem: Gastrointestinal -  Adult  Goal: Minimal or absence of nausea and vomiting  Outcome: Progressing  Flowsheets (Taken 06/08/2022 1600 by Jaye Beagle, RN)  Minimal or absence of nausea and vomiting:   Administer ordered antiemetic medications as needed   Advance diet as tolerated, if ordered     Problem: Genitourinary - Adult  Goal: Absence of urinary retention  Outcome: Progressing  Flowsheets (Taken 06/08/2022 1600 by Jaye Beagle, RN)  Absence of urinary retention:   Assess patient's ability to void and empty bladder   Monitor intake/output and perform bladder scan as needed     Problem: Infection - Adult  Goal: Absence of infection at discharge  Outcome: Progressing  Flowsheets (Taken 06/08/2022 1600 by Jaye Beagle, RN)  Absence of infection at discharge:   Assess and monitor for signs and symptoms of infection   Monitor lab/diagnostic results   Monitor all insertion sites i.e., indwelling lines, tubes and drains   Administer medications as ordered     Problem: Pain  Goal: Verbalizes/displays adequate comfort level or baseline comfort level  Outcome: Progressing     Problem: Nutrition Deficit:  Goal: Optimize nutritional status  Outcome: Progressing

## 2022-06-09 NOTE — Plan of Care (Signed)
Problem: Physical Therapy - Adult  Goal: By Discharge: Performs mobility at highest level of function for planned discharge setting.  See evaluation for individualized goals.  Description: Physical Therapy Goals  Revised 05/31/2022  1.  Patient will move from supine to sit and sit to supine  in bed with minimal assistance/contact guard assist within 7 day(s).  2.  Patient will do lateral transfer from bed to chair and chair to bed with minimal assistance/contact guard assist using the least restrictive device within 7 day(s).  3.  Patient will perform sit to stand with minimal assistance/contact guard assist within 7 day(s).  4.  Patient will ambulate with minimal assistance/contact guard assist for 5x2 feet with the least restrictive device within 7 day(s).        FUNCTIONAL STATUS PRIOR TO ADMISSION: Patient was modified independent using a rolling walker for functional mobility. The patient lives in [place] and is in town visiting her sister who lives locally.     HOME SUPPORT PRIOR TO ADMISSION: The patient lived with her significant other in [place] but did not require assistance. She reports 2 falls in the past month.     Physical Therapy Goals  Initiated 05/24/2022  1.  Patient will move from supine to sit and sit to supine, scoot up and down, and roll side to side in bed with independence within 7 day(s).    2.  Patient will perform sit to stand with contact guard assist within 7 day(s).  3.  Patient will transfer from bed to chair and chair to bed with supervision/set-up using the least restrictive device within 7 day(s).  4.  Patient will ambulate with supervision/set-up for 50 feet with the least restrictive device within 7 day(s).   5.  Patient will ascend/descend 4 stairs with 1-2 handrail(s) with contact guard assist within 7 day(s).    Outcome: Progressing   PHYSICAL THERAPY TREATMENT    Patient: Meghan [name] (76 y.o. female)  Date: 06/09/2022  Diagnosis: Encephalopathy [G93.40]  Acute encephalopathy  [G93.40]  Closed head injury, initial encounter [S09.90XA]  Unwitnessed fall [R29.6] Encephalopathy  Procedure(s) (LRB):  ANKLE EXTERNAL FIXATOR APPLICATION RIGHT (Right) 11 Days Post-Op  Precautions: Weight Bearing, Fall Risk (NWB RLE) Right Lower Extremity Weight Bearing: Non Weight Bearing                  ASSESSMENT:  Patient continues to benefit from skilled PT services and is progressing towards goals. Continues to show overall progress and is able to maintain NWB during transfers but is challenged with sequencing the lateral transfer from start to finish and unable to achieve/maintain an anterior weight shift of the LLE to improve technique. She required cues to remain forward and to keep the LLE on the floor to improve pivot technique. Left in bedside chair with RLE elevated.    Awaiting surgical repair of a tri-malleolar ankle fracture next week and discharge recommendations TBD based on transfer technique and WB status at that time.         PLAN:  Patient continues to benefit from skilled intervention to address the above impairments.  Continue treatment per established plan of care.    Recommendation for discharge: (in order for the patient to meet his/her long term goals): Continue to assess pending progress    Other factors to consider for discharge:  [place] out of [place]    IF patient discharges home will need the following DME: continuing to assess with progress  SUBJECTIVE:   Patient stated, "Ok, what do you want me to do?."    OBJECTIVE DATA SUMMARY:   Critical Behavior:          Functional Mobility Training:  Bed Mobility:  Bed Mobility Training  Interventions: Weight shifting training/pressure relief;Verbal cues  Supine to Sit: Minimum assistance;Additional time  Scooting: Minimum assistance  Transfers:  Transfer Training  Bed to Chair: Moderate assistance;Additional time  Balance:  Balance  Sitting: Intact  Sitting - Static: Good (unsupported)  Sitting - Dynamic: Good (unsupported);Fair  (occasional)   Ambulation/Gait Training:        Neuro Re-Education:              Activity Tolerance:   Fair     After treatment:   Patient left in no apparent distress sitting up in chair and Call bell within reach      COMMUNICATION/EDUCATION:   The patient's plan of care was discussed with: registered nurse    Patient Education  Education Given To: Patient  Education Provided: Role of Geographical information systems officer  Education Method: Verbal  Barriers to Learning: Hearing;Cognition  Education Outcome: Continued education needed      Ralene Ok, PT  Minutes: 19

## 2022-06-09 NOTE — Plan of Care (Signed)
Problem: Skin/Tissue Integrity  Goal: Absence of new skin breakdown  Description: 1.  Monitor for areas of redness and/or skin breakdown  2.  Assess vascular access sites hourly  3.  Every 4-6 hours minimum:  Change oxygen saturation probe site  4.  Every 4-6 hours:  If on nasal continuous positive airway pressure, respiratory therapy assess nares and determine need for appliance change or resting period.  Outcome: Progressing     Problem: Safety - Adult  Goal: Free from fall injury  Outcome: Progressing     Problem: Physical Therapy - Adult  Goal: By Discharge: Performs mobility at highest level of function for planned discharge setting.  See evaluation for individualized goals.  Description: Physical Therapy Goals  Revised 05/31/2022  1.  Patient will move from supine to sit and sit to supine  in bed with minimal assistance/contact guard assist within 7 day(s).  2.  Patient will do lateral transfer from bed to chair and chair to bed with minimal assistance/contact guard assist using the least restrictive device within 7 day(s).  3.  Patient will perform sit to stand with minimal assistance/contact guard assist within 7 day(s).  4.  Patient will ambulate with minimal assistance/contact guard assist for 5x2 feet with the least restrictive device within 7 day(s).        FUNCTIONAL STATUS PRIOR TO ADMISSION: Patient was modified independent using a rolling walker for functional mobility. The patient lives in [place] and is in town visiting her sister who lives locally.     HOME SUPPORT PRIOR TO ADMISSION: The patient lived with her significant other in [place] but did not require assistance. She reports 2 falls in the past month.     Physical Therapy Goals  Initiated 05/24/2022  1.  Patient will move from supine to sit and sit to supine, scoot up and down, and roll side to side in bed with independence within 7 day(s).    2.  Patient will perform sit to stand with contact guard assist within 7 day(s).  3.  Patient will  transfer from bed to chair and chair to bed with supervision/set-up using the least restrictive device within 7 day(s).  4.  Patient will ambulate with supervision/set-up for 50 feet with the least restrictive device within 7 day(s).   5.  Patient will ascend/descend 4 stairs with 1-2 handrail(s) with contact guard assist within 7 day(s).    06/09/2022 1744 by Ralene Ok, PT  Outcome: Progressing     Problem: Chronic Conditions and Co-morbidities  Goal: Patient's chronic conditions and co-morbidity symptoms are monitored and maintained or improved  Outcome: Progressing     Problem: Discharge Planning  Goal: Discharge to home or other facility with appropriate resources  Outcome: Progressing     Problem: Skin/Tissue Integrity - Adult  Goal: Skin integrity remains intact  Outcome: Progressing     Problem: Musculoskeletal - Adult  Goal: Return mobility to safest level of function  Outcome: Progressing     Problem: Gastrointestinal - Adult  Goal: Minimal or absence of nausea and vomiting  Outcome: Progressing     Problem: Infection - Adult  Goal: Absence of infection at discharge  Outcome: Progressing     Problem: Nutrition Deficit:  Goal: Optimize nutritional status  Outcome: Progressing     Problem: ABCDS Injury Assessment  Goal: Absence of physical injury  Outcome: Progressing

## 2022-06-10 NOTE — Progress Notes (Signed)
Hospitalist Progress Note      NAME:  Meghan [name]   DOB:  May 31, 1946  MRM:  [id number]    Date/Time: 06/10/2022  1:03 PM           Assessment / Plan:     76 yo hx of HTN, Parkinson's dementia, seizure d/o, presented w/ AMS, hypoglycemia, hypotension.  Hospital course complicated by fall, R ankle fracture s/p external fixation 10/16     #R ankle fracture/pain: due to GLF.  S/p external fixation by Dr. Dema Severin on 10/16.    S/p bedside debridement of blister on 10/20.    Cont wound care.    Will cont IV dilaudid prn severe pain.    Cont PT/OT.    Defer to Ortho regarding timing of ORIF, surgery can be completed next Thursday 06/15/22.    Dr. Dema Severin.  He would like to have the patient remain inpatient for wound care until Tuesday of next week.  We both agree this is the best option for this patient especially with the severity of her blisters.       For next week: We will re-evaluate the wounds and determine timing for surgery at that point.      #Acute met encephalopathy: now stable.  Likely from hypoglycemia, hypotension.    EEG neg for seizures.    Head MRI unremarkable.    Will monitor for agitation     #Hypotension/hypoglycemia: now resolved.    ACTH stim test neg for adrenal insuff.    Endocrine was following     #AKI: resolved with IVF     #HTN: BP stable.  Cont lisinopril.  Use IV hydralazine prn     # DM type 2 w/ diabetic neuropathy: A1C 5.6%.  Off insulin due to hypoglycemia.  Diet control      #Thrombocytosis: likely due to fracture.  No further workup.  Will monitor prn     #Parkinson's dementia/depression seems to be at baseline.  Cont Namenda, Aricept, celexa      #Seizure d/o: EEG neg.  Cont Vimpat, depakote       I have personally reviewed the radiographs, laboratory data in Epic and decisions and statements above are based partially on this personal interpretation.                 Care Plan discussed with: Patient    Discussed:  Care Plan    Prophylaxis:  Lovenox    Disposition:   SNF/LTC           ___________________________________________________    Attending Physician: Bufford Buttner, MD        Subjective:     Chief Complaint:  R ankle fracture    ROS:  (bold if positive, if negative)    Tolerating PT  Tolerating Diet          Objective:     No events reported overnight for the patient  She continues to be stable     Vitals:          Last 24hrs VS reviewed since prior progress note. Most recent are:    Vitals:    06/10/22 1226   BP: (!) 140/73   Pulse: 89   Resp: 20   Temp: 97.5 F (36.4 C)   SpO2: 100%     SpO2 Readings from Last 6 Encounters:   06/10/22 100%   03/25/22 94%   03/25/22 99%          Intake/Output Summary (Last 24 hours)  at 06/10/2022 1303  Last data filed at 06/10/2022 0500  Gross per 24 hour   Intake 680 ml   Output 600 ml   Net 80 ml            Exam:     Physical Exam:    Gen:  elderly, frail, ill-appearing, NAD  HEENT:  Pink conjunctivae, PERRL, hearing intact to voice, moist mucous membranes  Neck:  Supple, without masses, thyroid non-tender  Resp:  No accessory muscle use, clear breath sounds without wheezes rales or rhonchi  Card:  No murmurs, normal S1, S2 without thrills, bruits or peripheral edema  Abd:  Soft, non-tender, non-distended, normoactive bowel sounds are present  Musc:  No cyanosis or clubbing.  External fixation present   Skin:  wound at fracture site,  see wound care note   Neuro:  moves all ext, generalized weakness  Psych:  poor insight, oriented to person      Medications Reviewed: (see below)    Lab Data Reviewed: (see below)    ______________________________________________________________________    Medications:     Current Facility-Administered Medications   Medication Dose Route Frequency    HYDROmorphone (DILAUDID) injection 0.5 mg  0.5 mg IntraVENous Q4H PRN    lisinopril (PRINIVIL;ZESTRIL) tablet 10 mg  10 mg Oral Daily    acidophilus probiotic capsule 1 capsule  1 capsule Oral Daily    divalproex (DEPAKOTE) DR tablet 500 mg  500 mg Oral 2  times per day    lacosamide (VIMPAT) tablet 100 mg  100 mg Oral BID    sodium chloride flush 0.9 % injection 5-40 mL  5-40 mL IntraVENous 2 times per day    sodium chloride flush 0.9 % injection 5-40 mL  5-40 mL IntraVENous PRN    0.9 % sodium chloride infusion   IntraVENous PRN    ondansetron (ZOFRAN-ODT) disintegrating tablet 4 mg  4 mg Oral Q8H PRN    Or    ondansetron (ZOFRAN) injection 4 mg  4 mg IntraVENous Q6H PRN    enoxaparin Sodium (LOVENOX) injection 30 mg  30 mg SubCUTAneous BID    HYDROcodone-acetaminophen (NORCO) 5-325 MG per tablet 1 tablet  1 tablet Oral Q4H PRN    hydrALAZINE (APRESOLINE) injection 10 mg  10 mg IntraVENous Q6H PRN    bupivacaine (PF) (MARCAINE) 0.5 % injection 150 mg  30 mL IntraDERmal Once    iopamidol (ISOVUE-370) 76 % injection 100 mL  100 mL IntraVENous ONCE PRN    glucose chewable tablet 16 g  4 tablet Oral PRN    dextrose bolus 10% 125 mL  125 mL IntraVENous PRN    Or    dextrose bolus 10% 250 mL  250 mL IntraVENous PRN    dextrose 10 % infusion   IntraVENous Continuous PRN    naloxone (NARCAN) injection 0.4 mg  0.4 mg IntraVENous PRN    sodium chloride flush 0.9 % injection 5-40 mL  5-40 mL IntraVENous 2 times per day    0.9 % sodium chloride infusion   IntraVENous PRN    polyethylene glycol (GLYCOLAX) packet 17 g  17 g Oral Daily PRN    acetaminophen (TYLENOL) tablet 650 mg  650 mg Oral Q6H PRN    Or    acetaminophen (TYLENOL) suppository 650 mg  650 mg Rectal Q6H PRN    dextrose bolus 10% 125 mL  125 mL IntraVENous PRN    Or    dextrose bolus 10% 250 mL  250 mL IntraVENous  PRN    glucagon injection 1 mg  1 mg SubCUTAneous PRN    dextrose 10 % infusion   IntraVENous Continuous PRN    aspirin chewable tablet 81 mg  81 mg Oral Daily    citalopram (CELEXA) tablet 10 mg  10 mg Oral Daily    donepezil (ARICEPT) tablet 10 mg  10 mg Oral Nightly    memantine (NAMENDA) tablet 10 mg  10 mg Oral BID    rOPINIRole (REQUIP) tablet 1 mg  1 mg Oral Nightly            Lab Review:      Recent Labs     06/08/22  0825   WBC 8.5   HGB 12.2   HCT 38.2   PLT 946*       Recent Labs     06/08/22  0825   NA 143   K 3.8   CL 107   CO2 32   BUN 14   MG 1.8   PHOS 2.9       No components found for: "GLPOC"

## 2022-06-11 NOTE — Plan of Care (Signed)
Problem: Skin/Tissue Integrity  Goal: Absence of new skin breakdown  Description: 1.  Monitor for areas of redness and/or skin breakdown  2.  Assess vascular access sites hourly  3.  Every 4-6 hours minimum:  Change oxygen saturation probe site  4.  Every 4-6 hours:  If on nasal continuous positive airway pressure, respiratory therapy assess nares and determine need for appliance change or resting period.  Outcome: Progressing     Problem: Safety - Adult  Goal: Free from fall injury  Outcome: Progressing     Problem: Chronic Conditions and Co-morbidities  Goal: Patient's chronic conditions and co-morbidity symptoms are monitored and maintained or improved  Outcome: Progressing     Problem: Discharge Planning  Goal: Discharge to home or other facility with appropriate resources  Outcome: Progressing     Problem: Neurosensory - Adult  Goal: Achieves stable or improved neurological status  Outcome: Progressing     Problem: Respiratory - Adult  Goal: Achieves optimal ventilation and oxygenation  Outcome: Progressing     Problem: Cardiovascular - Adult  Goal: Maintains optimal cardiac output and hemodynamic stability  Outcome: Progressing     Problem: Skin/Tissue Integrity - Adult  Goal: Skin integrity remains intact  Outcome: Progressing     Problem: Musculoskeletal - Adult  Goal: Return mobility to safest level of function  Outcome: Progressing     Problem: Gastrointestinal - Adult  Goal: Minimal or absence of nausea and vomiting  Outcome: Progressing     Problem: Infection - Adult  Goal: Absence of infection at discharge  Outcome: Progressing     Problem: Metabolic/Fluid and Electrolytes - Adult  Goal: Electrolytes maintained within normal limits  Outcome: Progressing     Problem: Hematologic - Adult  Goal: Maintains hematologic stability  Outcome: Progressing     Problem: Pain  Goal: Verbalizes/displays adequate comfort level or baseline comfort level  Outcome: Progressing     Problem: Nutrition Deficit:  Goal:  Optimize nutritional status  Outcome: Progressing     Problem: ABCDS Injury Assessment  Goal: Absence of physical injury  Outcome: Progressing

## 2022-06-11 NOTE — Progress Notes (Signed)
Hospitalist Progress Note      NAME:  Meghan [name]   DOB:  1945-09-30  MRM:  [id number]    Date/Time: 06/11/2022  1:48 PM           Assessment / Plan:     76 yo hx of HTN, Parkinson's dementia, seizure d/o, presented w/ AMS, hypoglycemia, hypotension.  Hospital course complicated by fall, R ankle fracture s/p external fixation 10/16     #R ankle fracture/pain: due to GLF.  S/p external fixation by Dr. Dema Severin on 10/16.    S/p bedside debridement of blister on 10/20.    Cont wound care.    Will cont IV dilaudid prn severe pain.    Cont PT/OT.    Defer to Ortho regarding timing of ORIF, surgery can be completed next Thursday 06/15/22.    Dr. Dema Severin.  He would like to have the patient remain inpatient for wound care until Tuesday of next week.  We both agree this is the best option for this patient especially with the severity of her blisters.       For next week: We will re-evaluate the wounds and determine timing for surgery at that point.      #Acute met encephalopathy: now stable.  Likely from hypoglycemia, hypotension.    EEG neg for seizures.    Head MRI unremarkable.    Will monitor for agitation     #Hypotension/hypoglycemia: now resolved.    ACTH stim test neg for adrenal insuff.    Endocrine was following     #AKI: resolved with IVF     #HTN: BP stable.  Cont lisinopril.  Use IV hydralazine prn     # DM type 2 w/ diabetic neuropathy: A1C 5.6%.  Off insulin due to hypoglycemia.  Diet control      #Thrombocytosis: likely due to fracture.  No further workup.  Will monitor prn     #Parkinson's dementia/depression seems to be at baseline.  Cont Namenda, Aricept, celexa      #Seizure d/o: EEG neg.  Cont Vimpat, depakote       I have personally reviewed the radiographs, laboratory data in Epic and decisions and statements above are based partially on this personal interpretation.                 Care Plan discussed with: Patient    Discussed:  Care Plan    Prophylaxis:  Lovenox    Disposition:   SNF/LTC           ___________________________________________________    Attending Physician: Bufford Buttner, MD        Subjective:     Chief Complaint:  R ankle fracture    ROS:  (bold if positive, if negative)    Tolerating PT  Tolerating Diet          Objective:     No events reported overnight for the patient  She continues to be stable     Vitals:          Last 24hrs VS reviewed since prior progress note. Most recent are:    Vitals:    06/11/22 1045   BP: 131/69   Pulse: 81   Resp: 20   Temp: 98.2 F (36.8 C)   SpO2: 96%     SpO2 Readings from Last 6 Encounters:   06/11/22 96%   03/25/22 94%   03/25/22 99%          Intake/Output Summary (Last 24 hours) at  06/11/2022 1348  Last data filed at 06/10/2022 2201  Gross per 24 hour   Intake --   Output 301 ml   Net -301 ml            Exam:     Physical Exam:    Gen:  elderly, frail, ill-appearing, NAD  HEENT:  Pink conjunctivae, PERRL, hearing intact to voice, moist mucous membranes  Neck:  Supple, without masses, thyroid non-tender  Resp:  No accessory muscle use, clear breath sounds without wheezes rales or rhonchi  Card:  No murmurs, normal S1, S2 without thrills, bruits or peripheral edema  Abd:  Soft, non-tender, non-distended, normoactive bowel sounds are present  Musc:  No cyanosis or clubbing.  External fixation present   Skin:  wound at fracture site,  see wound care note   Neuro:  moves all ext, generalized weakness  Psych:  poor insight, oriented to person      Medications Reviewed: (see below)    Lab Data Reviewed: (see below)    ______________________________________________________________________    Medications:     Current Facility-Administered Medications   Medication Dose Route Frequency    HYDROmorphone (DILAUDID) injection 0.5 mg  0.5 mg IntraVENous Q4H PRN    lisinopril (PRINIVIL;ZESTRIL) tablet 10 mg  10 mg Oral Daily    acidophilus probiotic capsule 1 capsule  1 capsule Oral Daily    divalproex (DEPAKOTE) DR tablet 500 mg  500 mg Oral 2 times  per day    lacosamide (VIMPAT) tablet 100 mg  100 mg Oral BID    sodium chloride flush 0.9 % injection 5-40 mL  5-40 mL IntraVENous 2 times per day    sodium chloride flush 0.9 % injection 5-40 mL  5-40 mL IntraVENous PRN    ondansetron (ZOFRAN-ODT) disintegrating tablet 4 mg  4 mg Oral Q8H PRN    Or    ondansetron (ZOFRAN) injection 4 mg  4 mg IntraVENous Q6H PRN    enoxaparin Sodium (LOVENOX) injection 30 mg  30 mg SubCUTAneous BID    HYDROcodone-acetaminophen (NORCO) 5-325 MG per tablet 1 tablet  1 tablet Oral Q4H PRN    hydrALAZINE (APRESOLINE) injection 10 mg  10 mg IntraVENous Q6H PRN    bupivacaine (PF) (MARCAINE) 0.5 % injection 150 mg  30 mL IntraDERmal Once    iopamidol (ISOVUE-370) 76 % injection 100 mL  100 mL IntraVENous ONCE PRN    glucose chewable tablet 16 g  4 tablet Oral PRN    dextrose bolus 10% 125 mL  125 mL IntraVENous PRN    Or    dextrose bolus 10% 250 mL  250 mL IntraVENous PRN    dextrose 10 % infusion   IntraVENous Continuous PRN    naloxone (NARCAN) injection 0.4 mg  0.4 mg IntraVENous PRN    sodium chloride flush 0.9 % injection 5-40 mL  5-40 mL IntraVENous 2 times per day    0.9 % sodium chloride infusion   IntraVENous PRN    polyethylene glycol (GLYCOLAX) packet 17 g  17 g Oral Daily PRN    acetaminophen (TYLENOL) tablet 650 mg  650 mg Oral Q6H PRN    Or    acetaminophen (TYLENOL) suppository 650 mg  650 mg Rectal Q6H PRN    dextrose bolus 10% 125 mL  125 mL IntraVENous PRN    Or    dextrose bolus 10% 250 mL  250 mL IntraVENous PRN    glucagon injection 1 mg  1 mg SubCUTAneous PRN  dextrose 10 % infusion   IntraVENous Continuous PRN    aspirin chewable tablet 81 mg  81 mg Oral Daily    citalopram (CELEXA) tablet 10 mg  10 mg Oral Daily    donepezil (ARICEPT) tablet 10 mg  10 mg Oral Nightly    memantine (NAMENDA) tablet 10 mg  10 mg Oral BID    rOPINIRole (REQUIP) tablet 1 mg  1 mg Oral Nightly            Lab Review:     No results for input(s): "WBC", "HGB", "HCT", "PLT" in the last  72 hours.    No results for input(s): "NA", "K", "CL", "CO2", "GLU", "BUN", "CREA", "CA", "MG", "PHOS", "ALB", "ALT", "INR" in the last 72 hours.    Invalid input(s): "TBIL", "SGOT"    No components found for: "[place]"

## 2022-06-12 LAB — CBC
Hematocrit: 35.5 % (ref 35.0–47.0)
Hemoglobin: 11.4 g/dL — ABNORMAL LOW (ref 11.5–16.0)
MCH: 29.8 PG (ref 26.0–34.0)
MCHC: 32.1 g/dL (ref 30.0–36.5)
MCV: 92.7 FL (ref 80.0–99.0)
MPV: 10.5 FL (ref 8.9–12.9)
Nucleated RBCs: 0 PER 100 WBC
Platelets: 990 10*3/uL — ABNORMAL HIGH (ref 150–400)
RBC: 3.83 M/uL (ref 3.80–5.20)
RDW: 15.6 % — ABNORMAL HIGH (ref 11.5–14.5)
WBC: 7.8 10*3/uL (ref 3.6–11.0)
nRBC: 0 10*3/uL (ref 0.00–0.01)

## 2022-06-12 LAB — BASIC METABOLIC PANEL
Anion Gap: 5 mmol/L (ref 5–15)
BUN: 19 MG/DL (ref 6–20)
Bun/Cre Ratio: 23 — ABNORMAL HIGH (ref 12–20)
CO2: 27 mmol/L (ref 21–32)
Calcium: 8.8 MG/DL (ref 8.5–10.1)
Chloride: 106 mmol/L (ref 97–108)
Creatinine: 0.81 MG/DL (ref 0.55–1.02)
Est, Glom Filt Rate: >60 mL/min/{1.73_m2} (ref >60)
Glucose: 72 mg/dL (ref 65–100)
Potassium: 3.8 mmol/L (ref 3.5–5.1)
Sodium: 138 mmol/L (ref 136–145)

## 2022-06-12 MED ORDER — HYDROCODONE-ACETAMINOPHEN 5-325 MG PO TABS
5-325 | Freq: Four times a day (QID) | ORAL | Status: DC | PRN
Start: 2022-06-12 — End: 2022-06-16
  Administered 2022-06-12 – 2022-06-13 (×2): 2 via ORAL

## 2022-06-12 MED ORDER — HYDROMORPHONE HCL PF 1 MG/ML IJ SOLN
1 MG/ML | INTRAMUSCULAR | Status: AC | PRN
Start: 2022-06-12 — End: 2022-06-16
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
## 2022-06-12 NOTE — Progress Notes (Signed)
Hospitalist Progress Note      NAME:  Meghan [name]   DOB:  05-Sep-1945  MRM:  [id number]    Date/Time: 06/12/2022  1:54 PM           Assessment / Plan:     76 yo hx of HTN, Parkinson's dementia, seizure d/o, presented w/ AMS, hypoglycemia, hypotension.  Hospital course complicated by fall, R ankle fracture s/p external fixation 10/16     #R ankle fracture/pain: due to GLF.  S/p external fixation by Dr. Dema Severin on 10/16.    S/p bedside debridement of blister on 10/20.    Cont wound care.    Will cont IV dilaudid prn severe pain.    Cont PT/OT.    Defer to Ortho regarding timing of ORIF, surgery can be completed Thursday 06/15/22.    Dr. Dema Severin.  He would like to have the patient remain inpatient for wound care until Tuesday of next week.  We both agree this is the best option for this patient especially with the severity of her blisters.       For next week: We will re-evaluate the wounds and determine timing for surgery at that point.      #Acute met encephalopathy: now stable.  Likely from hypoglycemia, hypotension.    EEG neg for seizures.    Head MRI unremarkable.    Will monitor for agitation     #Hypotension/hypoglycemia: now resolved.    ACTH stim test neg for adrenal insuff.    Endocrine was following     #AKI: resolved with IVF     #HTN: BP stable.  Cont lisinopril.  Use IV hydralazine prn     # DM type 2 w/ diabetic neuropathy: A1C 5.6%.  Off insulin due to hypoglycemia.  Diet control      #Thrombocytosis: likely due to fracture.  No further workup.  Will monitor prn     #Parkinson's dementia/depression seems to be at baseline.  Cont Namenda, Aricept, celexa      #Seizure d/o: EEG neg.  Cont Vimpat, depakote       I have personally reviewed the radiographs, laboratory data in Epic and decisions and statements above are based partially on this personal interpretation.                 Care Plan discussed with: Patient    Discussed:  Care Plan    Prophylaxis:  Lovenox    Disposition:   SNF/LTC           ___________________________________________________    Attending Physician: Bufford Buttner, MD        Subjective:     Chief Complaint:  R ankle fracture    ROS:  (bold if positive, if negative)    Tolerating PT  Tolerating Diet          Objective:     No events reported overnight for the patient  She continues to be stable     Vitals:          Last 24hrs VS reviewed since prior progress note. Most recent are:    Vitals:    06/12/22 1120   BP: 135/65   Pulse: 89   Resp: 16   Temp: 98.4 F (36.9 C)   SpO2: 96%     SpO2 Readings from Last 6 Encounters:   06/12/22 96%   03/25/22 94%   03/25/22 99%          Intake/Output Summary (Last 24 hours) at 06/12/2022  [place] filed at 06/12/2022 0829  Gross per 24 hour   Intake 400 ml   Output 450 ml   Net -50 ml            Exam:     Physical Exam:    Gen:  elderly, frail, ill-appearing, NAD  HEENT:  Pink conjunctivae, PERRL, hearing intact to voice, moist mucous membranes  Neck:  Supple, without masses, thyroid non-tender  Resp:  No accessory muscle use, clear breath sounds without wheezes rales or rhonchi  Card:  No murmurs, normal S1, S2 without thrills, bruits or peripheral edema  Abd:  Soft, non-tender, non-distended, normoactive bowel sounds are present  Musc:  No cyanosis or clubbing.  External fixation present   Skin:  wound at fracture site,  see wound care note   Neuro:  moves all ext, generalized weakness  Psych:  poor insight, oriented to person      Medications Reviewed: (see below)    Lab Data Reviewed: (see below)    ______________________________________________________________________    Medications:     Current Facility-Administered Medications   Medication Dose Route Frequency    HYDROcodone-acetaminophen (NORCO) 5-325 MG per tablet 2 tablet  2 tablet Oral Q6H PRN    HYDROmorphone HCl PF (DILAUDID) injection 1 mg  1 mg IntraVENous Q4H PRN    lisinopril (PRINIVIL;ZESTRIL) tablet 10 mg  10 mg Oral Daily    acidophilus probiotic capsule 1  capsule  1 capsule Oral Daily    divalproex (DEPAKOTE) DR tablet 500 mg  500 mg Oral 2 times per day    lacosamide (VIMPAT) tablet 100 mg  100 mg Oral BID    sodium chloride flush 0.9 % injection 5-40 mL  5-40 mL IntraVENous 2 times per day    sodium chloride flush 0.9 % injection 5-40 mL  5-40 mL IntraVENous PRN    ondansetron (ZOFRAN-ODT) disintegrating tablet 4 mg  4 mg Oral Q8H PRN    Or    ondansetron (ZOFRAN) injection 4 mg  4 mg IntraVENous Q6H PRN    enoxaparin Sodium (LOVENOX) injection 30 mg  30 mg SubCUTAneous BID    hydrALAZINE (APRESOLINE) injection 10 mg  10 mg IntraVENous Q6H PRN    bupivacaine (PF) (MARCAINE) 0.5 % injection 150 mg  30 mL IntraDERmal Once    iopamidol (ISOVUE-370) 76 % injection 100 mL  100 mL IntraVENous ONCE PRN    glucose chewable tablet 16 g  4 tablet Oral PRN    dextrose bolus 10% 125 mL  125 mL IntraVENous PRN    Or    dextrose bolus 10% 250 mL  250 mL IntraVENous PRN    dextrose 10 % infusion   IntraVENous Continuous PRN    naloxone (NARCAN) injection 0.4 mg  0.4 mg IntraVENous PRN    sodium chloride flush 0.9 % injection 5-40 mL  5-40 mL IntraVENous 2 times per day    0.9 % sodium chloride infusion   IntraVENous PRN    polyethylene glycol (GLYCOLAX) packet 17 g  17 g Oral Daily PRN    acetaminophen (TYLENOL) tablet 650 mg  650 mg Oral Q6H PRN    Or    acetaminophen (TYLENOL) suppository 650 mg  650 mg Rectal Q6H PRN    dextrose bolus 10% 125 mL  125 mL IntraVENous PRN    Or    dextrose bolus 10% 250 mL  250 mL IntraVENous PRN    glucagon injection 1 mg  1 mg SubCUTAneous  PRN    dextrose 10 % infusion   IntraVENous Continuous PRN    aspirin chewable tablet 81 mg  81 mg Oral Daily    citalopram (CELEXA) tablet 10 mg  10 mg Oral Daily    donepezil (ARICEPT) tablet 10 mg  10 mg Oral Nightly    memantine (NAMENDA) tablet 10 mg  10 mg Oral BID    rOPINIRole (REQUIP) tablet 1 mg  1 mg Oral Nightly            Lab Review:     Recent Labs     06/12/22  0217   WBC 7.8   HGB 11.4*   HCT  35.5   PLT 990*       Recent Labs     06/12/22  0217   NA 138   K 3.8   CL 106   CO2 27   BUN 19       No components found for: "GLPOC"

## 2022-06-12 NOTE — Plan of Care (Signed)
Problem: Skin/Tissue Integrity  Goal: Absence of new skin breakdown  Description: 1.  Monitor for areas of redness and/or skin breakdown  2.  Assess vascular access sites hourly  3.  Every 4-6 hours minimum:  Change oxygen saturation probe site  4.  Every 4-6 hours:  If on nasal continuous positive airway pressure, respiratory therapy assess nares and determine need for appliance change or resting period.  06/12/2022 0806 by Sherald Hess, RN  Outcome: Progressing  06/12/2022 0402 by Heriberto Antigua, Andriene, RN  Outcome: Progressing     Problem: Safety - Adult  Goal: Free from fall injury  06/12/2022 0806 by Sherald Hess, RN  Outcome: Progressing  06/12/2022 0402 by Heriberto Antigua, Andriene, RN  Outcome: Progressing     Problem: Chronic Conditions and Co-morbidities  Goal: Patient's chronic conditions and co-morbidity symptoms are monitored and maintained or improved  06/12/2022 0806 by Sherald Hess, RN  Outcome: Progressing  06/12/2022 0402 by Heriberto Antigua, Andriene, RN  Outcome: Progressing     Problem: Discharge Planning  Goal: Discharge to home or other facility with appropriate resources  06/12/2022 0806 by Sherald Hess, RN  Outcome: Progressing  06/12/2022 0402 by Heriberto Antigua, Andriene, RN  Outcome: Progressing     Problem: Neurosensory - Adult  Goal: Achieves stable or improved neurological status  06/12/2022 0806 by Sherald Hess, RN  Outcome: Progressing  06/12/2022 0402 by Heriberto Antigua, Andriene, RN  Outcome: Progressing     Problem: Respiratory - Adult  Goal: Achieves optimal ventilation and oxygenation  06/12/2022 0806 by Sherald Hess, RN  Outcome: Progressing  06/12/2022 0402 by Heriberto Antigua, Andriene, RN  Outcome: Progressing     Problem: Cardiovascular - Adult  Goal: Maintains optimal cardiac output and hemodynamic stability  06/12/2022 0806 by Sherald Hess, RN  Outcome: Progressing  06/12/2022 0402 by Heriberto Antigua, Andriene, RN  Outcome:  Progressing     Problem: Skin/Tissue Integrity - Adult  Goal: Skin integrity remains intact  06/12/2022 0806 by Sherald Hess, RN  Outcome: Progressing  06/12/2022 0402 by Heriberto Antigua, Andriene, RN  Outcome: Progressing     Problem: Musculoskeletal - Adult  Goal: Return mobility to safest level of function  06/12/2022 0806 by Sherald Hess, RN  Outcome: Progressing  06/12/2022 0402 by Heriberto Antigua, Andriene, RN  Outcome: Progressing     Problem: Gastrointestinal - Adult  Goal: Minimal or absence of nausea and vomiting  06/12/2022 0806 by Sherald Hess, RN  Outcome: Progressing  06/12/2022 0402 by Heriberto Antigua, Andriene, RN  Outcome: Progressing     Problem: Genitourinary - Adult  Goal: Absence of urinary retention  06/12/2022 0806 by Sherald Hess, RN  Outcome: Progressing  06/12/2022 0402 by Heriberto Antigua, Andriene, RN  Outcome: Progressing     Problem: Infection - Adult  Goal: Absence of infection at discharge  06/12/2022 0806 by Sherald Hess, RN  Outcome: Progressing  06/12/2022 0402 by Heriberto Antigua, Andriene, RN  Outcome: Progressing     Problem: Metabolic/Fluid and Electrolytes - Adult  Goal: Electrolytes maintained within normal limits  06/12/2022 0806 by Sherald Hess, RN  Outcome: Progressing  06/12/2022 0402 by Heriberto Antigua, Andriene, RN  Outcome: Progressing     Problem: Hematologic - Adult  Goal: Maintains hematologic stability  06/12/2022 0806 by Sherald Hess, RN  Outcome: Progressing  06/12/2022 0402 by Heriberto Antigua, Andriene, RN  Outcome: Progressing     Problem: Pain  Goal: Verbalizes/displays adequate comfort level or baseline comfort level  06/12/2022 0806 by Sherald Hess, RN  Outcome: Progressing  06/12/2022 0402 by Lucilla Edin, Andriene, RN  Outcome: Progressing     Problem: Nutrition Deficit:  Goal: Optimize nutritional status  06/12/2022 0806 by Iven Finn, RN  Outcome: Progressing  06/12/2022 0402 by Lucilla Edin, Andriene,  RN  Outcome: Progressing     Problem: ABCDS Injury Assessment  Goal: Absence of physical injury  06/12/2022 0806 by Iven Finn, RN  Outcome: Progressing  06/12/2022 0402 by Lucilla Edin, Andriene, RN  Outcome: Progressing

## 2022-06-12 NOTE — Progress Notes (Signed)
Comprehensive Nutrition Assessment    Type and Reason for Visit:  Reassess    Nutrition Recommendations/Plan:   Continue regular diet  Provide Juven BID to aid in wound healing (95 kcal, 9.8 g carbs, complete amino acids for wound healing 2.5 g)     Malnutrition Assessment:  Malnutrition Status:  Insufficient data (06/12/22 1349)    Context:  Acute Illness     Findings of the 6 clinical characteristics of malnutrition:  Energy Intake:  No significant decrease in energy intake  Weight Loss:  No significant weight loss     Body Fat Loss:  Unable to assess     Muscle Mass Loss:  Unable to assess    Fluid Accumulation:  No significant fluid accumulation    Grip Strength:  Not Performed    Nutrition Assessment:     10/30: Follow up. Patient remains awaiting surgery, planned for 11/2 (this Thursday). Seen just before lunch - lack of PO intake documented but patient reports good appetite. Nursing reports patient dislikes ONS but has been reinforced multiple times that this is assisting in wound healing. Patient stating she will continue ONS at this time. Per documentation, consuming ~50-75% of meals. Denies nausea/vomiting/diarrhea. Reports some pain but cannot tell me where this is. Declines NFPE. Mild increase in edema in LLE.     Meal Intake:   Patient Vitals for the past 168 hrs:   PO Meals Eaten (%)   06/12/22 0829 76 - 100%   06/09/22 1336 51 - 75%   06/07/22 1448 26 - 50%   06/07/22 0916 51 - 75%   06/06/22 0400 0%   06/05/22 2000 76 - 100%     Supplement Intake:  Patient Vitals for the past 168 hrs:   PO Supplement (%)   06/12/22 0829 51 - 75%       Nutrition Related Findings:      Wound Type:  (broken R ankle)     Last BM: 06/11/22  Edema: Right lower extremity      RUE Edema: None  LUE Edema: None  RLE Edema:  (DRESSING IN PLACE)  LLE Edema: +1    Nutr. Labs:    Lab Results   Component Value Date    CREATININE 0.81 06/12/2022    BUN 19 06/12/2022    NA 138 06/12/2022    K 3.8 06/12/2022    CL 106 06/12/2022     CO2 27 06/12/2022       Lab Results   Component Value Date/Time    POCGLU 86 05/29/2022 07:36 PM    POCGLU 93 05/29/2022 12:52 AM    POCGLU 157 05/28/2022 08:03 PM    POCGLU 107 05/28/2022 02:02 PM    POCGLU 70 05/28/2022 12:06 PM    POCGLU 90 05/28/2022 05:47 AM        Hemoglobin A1C   Date Value Ref Range Status   06/02/2022 5.6 4.0 - 5.6 % Final     Comment:     (NOTE)  HbA1C Interpretive Ranges  <5.7              Normal  5.7 - 6.4         Consider Prediabetes  >6.5              Consider Diabetes       Hemoglobin A1C, POC   Date Value Ref Range Status   01/08/2018 5.4 % Final       Lab Results   Component Value  Date/Time    MG 1.8 06/08/2022 08:25 AM       Lab Results   Component Value Date    CALCIUM 8.8 06/12/2022    PHOS 2.9 06/08/2022        Lab Results   Component Value Date    TRIG 90 02/25/2018       Nutr. Meds:  Scheduled Meds:   lisinopril  10 mg Oral Daily    acidophilus probiotic  1 capsule Oral Daily    divalproex  500 mg Oral 2 times per day    lacosamide  100 mg Oral BID    sodium chloride flush  5-40 mL IntraVENous 2 times per day    enoxaparin  30 mg SubCUTAneous BID    bupivacaine (PF)  30 mL IntraDERmal Once    sodium chloride flush  5-40 mL IntraVENous 2 times per day    aspirin  81 mg Oral Daily    citalopram  10 mg Oral Daily    donepezil  10 mg Oral Nightly    memantine  10 mg Oral BID    rOPINIRole  1 mg Oral Nightly     Continuous Infusions:   dextrose      sodium chloride      dextrose       PRN Meds: HYDROcodone 5 mg - acetaminophen, HYDROmorphone, sodium chloride flush, ondansetron **OR** ondansetron, hydrALAZINE, iopamidol, glucose, dextrose bolus **OR** dextrose bolus, dextrose, naloxone, sodium chloride, polyethylene glycol, acetaminophen **OR** acetaminophen, dextrose bolus **OR** dextrose bolus, glucagon (rDNA), dextrose      Current Nutrition Intake & Therapies:    Average Meal Intake: 51-75%  Average Supplements Intake: 51-75% (single instance documented)  ADULT DIET;  Regular  ADULT ORAL NUTRITION SUPPLEMENT; Breakfast, Dinner; Wound Healing Oral Supplement    Anthropometric Measures:  Height: 167.6 cm (5' 5.98")  Ideal Body Weight (IBW): 130 lbs (59 kg)       Current Body Weight: 81.2 kg (179 lb), 137.7 % IBW. Weight Source: Bed Scale  Current BMI (kg/m2): 28.9        Weight Adjustment For: No Adjustment                 BMI Categories: Overweight (BMI 25.0-29.9)    Estimated Daily Nutrient Needs:  Energy Requirements Based On: Formula  Weight Used for Energy Requirements: Current  Energy (kcal/day): 1728 (MSJ x 1.3)  Weight Used for Protein Requirements: Current  Protein (g/day): 81-97 (1.0-1.2 g/kg)  Method Used for Fluid Requirements: 1 ml/kcal  Fluid (ml/day): 1728    Nutrition Diagnosis:   Increased nutrient needs related to acute injury/trauma as evidenced by  (R broken ankle)    Nutrition Interventions:   Food and/or Nutrient Delivery: Continue Current Diet, Continue Oral Nutrition Supplement  Nutrition Education/Counseling: No recommendation at this time  Coordination of Nutrition Care: Continue to monitor while inpatient, Interdisciplinary Rounds  Plan of Care discussed with: IDR team    Goals:  Previous Goal Met: Goal(s) Achieved  Goals: Meet at least 75% of estimated needs, by next RD assessment       Nutrition Monitoring and Evaluation:   Behavioral-Environmental Outcomes: None Identified  Food/Nutrient Intake Outcomes: Food and Nutrient Intake, Supplement Intake  Physical Signs/Symptoms Outcomes: Biochemical Data    Discharge Planning:    Continue Oral Nutrition Supplement     Ola Spurr, RD MS  Contact: Ext: [phone], or via [employer]

## 2022-06-12 NOTE — Progress Notes (Signed)
Orthopaedic Progress Note  Post Op day: 14 Days Post-Op    June 12, 2022 1:57 PM     Patient: Meghan [name] MRN: [id number]  SSN: [id number]    Date of Birth: Dec 23, 1945  Age: 76 y.o.  Sex: female      Admit date:  05/23/2022  Date of Surgery:  @ORDATE @   Procedures:  Procedure(s):  ANKLE EXTERNAL FIXATOR APPLICATION RIGHT  Admitting Physician:  , DO   Surgeon:  Horald Chestnut) and Role:     * Moishe Spice., MD - Primary    Consulting Physician(s): Treatment Team: Attending Provider: Paulino Rily, MD; Consulting Physician: Molinda Bailiff, MD; Case Manager: Catha Brow; Consulting Physician: Rebbeca Paul, MD; Consulting Physician: Lyman Speller, APRN - NP; Consulting Physician: Gus Puma, MD; Surgeon: Carlisle Cater., MD; Consulting Physician: Paulino Rily, MD; Patient Care Tech: Molinda Bailiff; Registered Nurse: Roswell Nickel, RN; Occupational Therapist: Sherald Hess, OT; Physical Therapist: Marca Ancona, PT    SUBJECTIVE:     Meghan [name] is a 76 y.o. female is 14 Days Post-Op s/p Procedure(s):  ANKLE EXTERNAL FIXATOR APPLICATION RIGHT with an appropriate level of post-operative pain.  Dressing changes every 2-3 days.  She is in a chair this am.  No complaints of nausea, vomiting, dizziness, lightheadedness, chest pain, or shortness of breath.    OBJECTIVE:       Physical Exam:  General: Alert, cooperative, no distress.    Respiratory: Respirations unlabored  Neurological:  Neurovascular exam within normal limits.  Motor: + DF/PF on left.  Moves toes on the right.  Palpable DP on the right.  BCR of all digits on the right.    Musculoskeletal: Calves soft, supple, non-tender upon palpation.    Dressing/Wound:  Clean, dry and intact. No significant erythema or swelling.                Vital Signs:      Patient Vitals for the past 8 hrs:   BP Temp Temp src Pulse Resp SpO2   06/12/22 1120 135/65 98.4 F (36.9 C) Oral 89 16 96 %   06/12/22 0735 129/76  98.2 F (36.8 C) Oral 72 14 100 %                                          Temp (24hrs), Avg:98.4 F (36.9 C), Min:98.2 F (36.8 C), Max:98.8 F (37.1 C)      Labs:        Recent Labs     06/12/22  0217   HCT 35.5   HGB 11.4*     Lab Results   Component Value Date/Time    NA 138 06/12/2022 02:17 AM    K 3.8 06/12/2022 02:17 AM    CL 106 06/12/2022 02:17 AM    CO2 27 06/12/2022 02:17 AM    BUN 19 06/12/2022 02:17 AM       PT/OT:                Patient mobility                         ASSESSMENT / PLAN:   Principal Problem:    Encephalopathy  Active Problems:    AKI (acute kidney injury) (HCC)    Hypoglycemia    Stroke (cerebrum) (HCC)    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 *              A: 1. S/P right ankle external fixator application and closed reduction POD 14  2. Blisters from fracture, right ankle     Pain Control: Adequate currently on Tylenol and Norco   DVT Prophylaxis: Lovenox 30 mg SC BID   Hemodynamics: Anemia- stable.  From fracture and chronic disease.    Right ankle: Plan for ORIF when skin has fully healed.  We will re-check wounds on Wednesday.  Will consider ORIF Thursday, but may need another week.  She needs to remain inpatient for chronic wound management in setting of fracture blisters.    Activity: NWB RLE.     Disposition: SNF     Signed By:  Dorthey Sawyer, [place]

## 2022-06-12 NOTE — Plan of Care (Signed)
Problem: Skin/Tissue Integrity  Goal: Absence of new skin breakdown  Description: 1.  Monitor for areas of redness and/or skin breakdown  2.  Assess vascular access sites hourly  3.  Every 4-6 hours minimum:  Change oxygen saturation probe site  4.  Every 4-6 hours:  If on nasal continuous positive airway pressure, respiratory therapy assess nares and determine need for appliance change or resting period.  Outcome: Progressing     Problem: Safety - Adult  Goal: Free from fall injury  Outcome: Progressing     Problem: Chronic Conditions and Co-morbidities  Goal: Patient's chronic conditions and co-morbidity symptoms are monitored and maintained or improved  Outcome: Progressing     Problem: Discharge Planning  Goal: Discharge to home or other facility with appropriate resources  Outcome: Progressing     Problem: Neurosensory - Adult  Goal: Achieves stable or improved neurological status  Outcome: Progressing     Problem: Respiratory - Adult  Goal: Achieves optimal ventilation and oxygenation  Outcome: Progressing     Problem: Cardiovascular - Adult  Goal: Maintains optimal cardiac output and hemodynamic stability  Outcome: Progressing     Problem: Skin/Tissue Integrity - Adult  Goal: Skin integrity remains intact  Outcome: Progressing     Problem: Musculoskeletal - Adult  Goal: Return mobility to safest level of function  Outcome: Progressing     Problem: Gastrointestinal - Adult  Goal: Minimal or absence of nausea and vomiting  Outcome: Progressing     Problem: Genitourinary - Adult  Goal: Absence of urinary retention  Outcome: Progressing     Problem: Infection - Adult  Goal: Absence of infection at discharge  Outcome: Progressing     Problem: Metabolic/Fluid and Electrolytes - Adult  Goal: Electrolytes maintained within normal limits  Outcome: Progressing     Problem: Hematologic - Adult  Goal: Maintains hematologic stability  Outcome: Progressing     Problem: Pain  Goal: Verbalizes/displays adequate comfort  level or baseline comfort level  Outcome: Progressing     Problem: Nutrition Deficit:  Goal: Optimize nutritional status  Outcome: Progressing     Problem: ABCDS Injury Assessment  Goal: Absence of physical injury  Outcome: Progressing

## 2022-06-12 NOTE — Plan of Care (Signed)
Problem: Occupational Therapy - Adult  Goal: By Discharge: Performs self-care activities at highest level of function for planned discharge setting.  See evaluation for individualized goals.  Description: FUNCTIONAL STATUS PRIOR TO ADMISSION:  Pt is independent with ADLs and mobility.      HOME SUPPORT: Patient is a poor historian, reporting she is visiting from out of town ([place]) and is staying with her sister here in [place]. Per chart she resides in a Group Home.    Occupational Therapy Goals:  Weekly re assessment 10/252023- continue goals   Re-Evaluation 05/31/2022 - now RLE NWB s/p R ankle external fixation (sustained R ankle fracture during GLF this admission)  1.  Patient will perform seated grooming with Supervision/Set-up within 7 day(s).  2.  Patient will perform lateral transfer to drop-arm chair/BSC with Minimal Assist x1 in prep for OOB ADL engagementwithin 7 day(s).  3.  Patient will perform seated/standing bathing with Moderate Assist within 7 day(s).  4.  Patient will perform stand-pivot toilet transfers with Moderate Assist and Assist x2 using RW within 7 day(s).  5.  Patient will perform all aspects of toileting with Minimal Assist within 7 day(s).  6.  Patient will participate in upper extremity therapeutic exercise/activities with Supervision for 10 minutes within 7 day(s).    7.  Patient will utilize energy conservation and fall prevention techniques during functional activities with verbal cues within 7 day(s).    Initiated 05/24/2022  1.  Patient will perform grooming, standing at sink, with Modified Independence within 7 day(s).  2.  Patient will perform lower body dressing with Modified Independence within 7 day(s).  3.  Patient will perform bathing, sitting and standing PRN, with Supervision within 7 day(s).  4.  Patient will perform toilet transfers with Modified Independence  within 7 day(s).  5.  Patient will perform all aspects of toileting with Modified Independence within 7  day(s).  6.  Patient will participate in upper extremity therapeutic exercise/activities with Independence for 10 minutes within 7 day(s).    Outcome: Progressing   OCCUPATIONAL THERAPY TREATMENT  Patient: Meghan [name] (76 y.o. female)  Date: 06/12/2022  Primary Diagnosis: Encephalopathy [G93.40]  Acute encephalopathy [G93.40]  Closed head injury, initial encounter [S09.90XA]  Unwitnessed fall [R29.6]  Procedure(s) (LRB):  ANKLE EXTERNAL FIXATOR APPLICATION RIGHT (Right) 14 Days Post-Op   Precautions: Weight Bearing, Fall Risk (NWB RLE) Right Lower Extremity Weight Bearing: Non Weight Bearing              Chart, occupational therapy assessment, plan of care, and goals were reviewed.    ASSESSMENT  Patient continues to benefit from skilled OT services and is progressing towards goals. Patient received supine in bed and agreeable to activity.  She comes to EOB with CGA.  Sit to stand with mod assist x 2.   Transfer to chair set up next to bed with mod x 2 assist.  Patient able to maintain NWB to R foot but fatigues quickly.  She will require lateral scooting to return to bed.  Patient left in NAD in chair, set up for lunch.  Plan for surgical repair 11/2.             PLAN :  Patient continues to benefit from skilled intervention to address the above impairments.  Continue treatment per established plan of care to address goals.    Recommend with staff: OOB to chair, BSC transfers     Recommend next OT session: progression of goals  Recommendation for discharge: (in order for the patient to meet his/her long term goals): Continue to assess pending progress    Other factors to consider for discharge: no additional factors    IF patient discharges home will need the following DME:  TBD       SUBJECTIVE:   Patient stated "I know, no weight on this one."    OBJECTIVE DATA SUMMARY:   Cognitive/Behavioral Status:  Orientation  Overall Orientation Status: Within Normal Limits  Orientation Level: Oriented to  person;Oriented to place  Cognition  Overall Cognitive Status: Exceptions  Arousal/Alertness: Delayed responses to stimuli  Following Commands: Follows one step commands with repetition  Safety Judgement: Decreased awareness of need for assistance;Decreased awareness of need for safety  Insights: Decreased awareness of deficits  Initiation: Requires cues for some  Sequencing: Requires cues for some    Functional Mobility and Transfers for ADLs:  Bed Mobility:  Bed Mobility Training  Bed Mobility Training: Yes  Interventions: Verbal cues  Rolling: Contact-guard assistance;Assist X1  Supine to Sit: Contact-guard assistance;Assist X1;Additional time  Scooting: Stand-by assistance;Assist X1;Additional time     Transfers:   Pharmacologist: Yes  Interventions: Verbal cues;Tactile cues;Safety awareness training;Manual cues;Weight shifting training/pressure relief  Sit to Stand: Moderate assistance;Additional time;Adaptive equipment;Assist X2  Stand to Sit: Moderate assistance;Adaptive equipment;Minimum assistance;Assist X2  Bed to Chair: Moderate assistance;Additional time;Assist X1           Balance:  Standing: Impaired  Balance  Sitting: Intact  Standing: Impaired  Standing - Static: Fair;Constant support  Standing - Dynamic: Poor;Constant support;Fair      ADL Intervention:         Feeding: Setup                                                                 Pain Rating:  Does not rate/10   Pain Intervention(s):   rest, elevation, and repositioning      Activity Tolerance:   Good  Please refer to the flowsheet for vital signs taken during this treatment.    After treatment:   Patient left in no apparent distress sitting up in chair, Call bell within reach, Bed/ chair alarm activated, Caregiver / family present, and Side rails x3    COMMUNICATION/EDUCATION:   The patient's plan of care was discussed with: physical therapist and registered nurse         Thank you for this referral.  Wadie Lessen, OTR/L  Minutes: 23

## 2022-06-13 NOTE — Plan of Care (Signed)
Problem: Physical Therapy - Adult  Goal: By Discharge: Performs mobility at highest level of function for planned discharge setting.  See evaluation for individualized goals.  Description: Physical Therapy Goals  Revised 06/12/22 - Continue with same goals     Revised 05/31/2022  1.  Patient will move from supine to sit and sit to supine  in bed with minimal assistance/contact guard assist within 7 day(s).  2.  Patient will do lateral transfer from bed to chair and chair to bed with minimal assistance/contact guard assist using the least restrictive device within 7 day(s).  3.  Patient will perform sit to stand with minimal assistance/contact guard assist within 7 day(s).  4.  Patient will ambulate with minimal assistance/contact guard assist for 5x2 feet with the least restrictive device within 7 day(s).        FUNCTIONAL STATUS PRIOR TO ADMISSION: Patient was modified independent using a rolling walker for functional mobility. The patient lives in [place] and is in town visiting her sister who lives locally.     HOME SUPPORT PRIOR TO ADMISSION: The patient lived with her significant other in [place] but did not require assistance. She reports 2 falls in the past month.     Physical Therapy Goals  Initiated 05/24/2022  1.  Patient will move from supine to sit and sit to supine, scoot up and down, and roll side to side in bed with independence within 7 day(s).    2.  Patient will perform sit to stand with contact guard assist within 7 day(s).  3.  Patient will transfer from bed to chair and chair to bed with supervision/set-up using the least restrictive device within 7 day(s).  4.  Patient will ambulate with supervision/set-up for 50 feet with the least restrictive device within 7 day(s).   5.  Patient will ascend/descend 4 stairs with 1-2 handrail(s) with contact guard assist within 7 day(s).    Outcome: Progressing   PHYSICAL THERAPY TREATMENT    Patient: Meghan [name] (76 y.o. female)  Date: 06/13/2022  Diagnosis:  Encephalopathy [G93.40]  Acute encephalopathy [G93.40]  Closed head injury, initial encounter [S09.90XA]  Unwitnessed fall [R29.6] Encephalopathy  Procedure(s) (LRB):  ANKLE EXTERNAL FIXATOR APPLICATION RIGHT (Right) 15 Days Post-Op  Precautions: Weight Bearing, Fall Risk (NWB RLE) Right Lower Extremity Weight Bearing: Non Weight Bearing (external fixator)                  ASSESSMENT:  Patient continues to benefit from skilled PT services and is slowly progressing towards goals. Pt received supine in bed.  Appears to be more alert and engaged today.  Continues with significant HOH.  R external fixtator in place.  Removal with ORIF pending 11/3.  She continues to be NWB on RLE.  Pt overall needing Min A to EOB and Mod Ax2 with sit to stand with RW.  Manual A to keep RLE NWB.  Gait is very limited to transfers, yet today unable and recommending lateral transfers for safety and NWB maintenance.  SNF for rehab supported         PLAN:  Patient continues to benefit from skilled intervention to address the above impairments.  Continue treatment per established plan of care.    Recommendation for discharge: (in order for the patient to meet his/her long term goals): Therapy up to 5 days/week in Skilled nursing facility    Other factors to consider for discharge:  pt lives [place]?    IF patient discharges home will need  the following DME:  tbd       SUBJECTIVE:   Patient stated, "I live in [place]."    OBJECTIVE DATA SUMMARY:   Critical Behavior:  Orientation  Overall Orientation Status: Within Normal Limits  Orientation Level: Oriented to person;Oriented to place       Functional Mobility Training:  Bed Mobility:  Bed Mobility Training  Rolling: Minimum assistance;Additional time  Supine to Sit: Minimum assistance;Additional time  Sit to Supine: Minimum assistance;Additional time  Scooting: Contact-guard assistance;Additional time  Transfers:  Transfer Training  Overall Level of Assistance: Moderate assistance;Assist X2;Additional  time  Balance:  Balance  Sitting: Intact  Sitting - Static: Good (unsupported)  Sitting - Dynamic: Good (unsupported);Fair (occasional)  Standing - Static: Fair;Constant support;Poor  Standing - Dynamic: Constant support;Poor   Ambulation/Gait Training:        Neuro Re-Education:                    Pain Rating:  0/10   Pain Intervention(s):   nursing notified and addressing, elevation, and repositioning    Activity Tolerance:   Fair     After treatment:   Patient left in no apparent distress in bed, Call bell within reach, Bed/ chair alarm activated, Side rails x3, and Heels elevated for pressure relief      COMMUNICATION/EDUCATION:   The patient's plan of care was discussed with: registered nurse and case manager    Patient Education  Education Given To: Patient  Education Provided: Role of Therapy;Transfer Training;Equipment  Education Provided Comments: Safety awareness with transfers and gait  Education Outcome: Verbalized understanding;Continued education needed      Dailey Alberson D Yanni Quiroa, PT  Minutes: 15

## 2022-06-13 NOTE — Progress Notes (Signed)
Hospitalist Progress Note      NAME:  Meghan [name]   DOB:  1946-04-02  MRM:  [id number]    Date/Time: 06/13/2022  3:56 PM           Assessment / Plan:     76 yo hx of HTN, Parkinson's dementia, seizure d/o, presented w/ AMS, hypoglycemia, hypotension.  Hospital course complicated by fall, R ankle fracture s/p external fixation 10/16     #R ankle fracture/pain: due to GLF.  S/p external fixation by Dr. Dema Severin on 10/16.  S/p bedside debridement of blister on 10/20.    - Plan for wound eval tmr; if well appearing, can have ORIF 11/2; if needs more time, will be completed a week thereafter     #Acute met encephalopathy: now stable.  Likely from hypoglycemia, hypotension.         #Hypotension/hypoglycemia: now resolved.    ACTH stim test neg for adrenal insuff.        #AKI: resolved with IVF     #HTN: BP stable.  Cont lisinopril.  Use IV hydralazine prn     # DM type 2 w/ diabetic neuropathy: A1C 5.6%.  Off insulin due to hypoglycemia.  Diet control        #Parkinson's dementia/depression seems to be at baseline.  Cont Namenda, Aricept, celexa      #Seizure d/o: Cont Vimpat, depakote       I have personally reviewed the radiographs, laboratory data in Epic and decisions and statements above are based partially on this personal interpretation.                 Care Plan discussed with: Patient    Discussed:  Care Plan    Prophylaxis:  Lovenox    Disposition:  SNF/LTC           ___________________________________________________    Attending Physician: Lyn Hollingshead, MD        Subjective:   No acute events overnight. Feels ok, tired of being here. Pain is ok    ROS:  (bold if positive, if negative)    Tolerating PT  Tolerating Diet          Objective:         Vitals:          Last 24hrs VS reviewed since prior progress note. Most recent are:    Vitals:    06/13/22 1507   BP: (!) 88/55   Pulse: 78   Resp: 14   Temp: 99 F (37.2 C)   SpO2:      SpO2 Readings from Last 6 Encounters:   06/13/22 92%   03/25/22 94%    03/25/22 99%          Intake/Output Summary (Last 24 hours) at 06/13/2022 1556  Last data filed at 06/13/2022 0113  Gross per 24 hour   Intake --   Output 300 ml   Net -300 ml            Exam:     Physical Exam:    Gen:  elderly, frail, ill-appearing, NAD  HEENT:  Pink conjunctivae, PERRL, hearing intact to voice, moist mucous membranes  Neck:  Supple, without masses, thyroid non-tender  Resp:  No accessory muscle use, clear breath sounds without wheezes rales or rhonchi  Card:  No murmurs, normal S1, S2 without thrills, bruits or peripheral edema  Abd:  Soft, non-tender, non-distended, normoactive bowel sounds are present  Musc:  No cyanosis or clubbing.  External fixation present   Skin:  RLE warpped; no surrounding erythema  Neuro:  moves all ext, generalized weakness  Psych:  poor insight, oriented to person      Medications Reviewed: (see below)    Lab Data Reviewed: (see below)    ______________________________________________________________________    Medications:     Current Facility-Administered Medications   Medication Dose Route Frequency    HYDROcodone-acetaminophen (NORCO) 5-325 MG per tablet 2 tablet  2 tablet Oral Q6H PRN    HYDROmorphone HCl PF (DILAUDID) injection 1 mg  1 mg IntraVENous Q4H PRN    lisinopril (PRINIVIL;ZESTRIL) tablet 10 mg  10 mg Oral Daily    acidophilus probiotic capsule 1 capsule  1 capsule Oral Daily    divalproex (DEPAKOTE) DR tablet 500 mg  500 mg Oral 2 times per day    lacosamide (VIMPAT) tablet 100 mg  100 mg Oral BID    sodium chloride flush 0.9 % injection 5-40 mL  5-40 mL IntraVENous 2 times per day    sodium chloride flush 0.9 % injection 5-40 mL  5-40 mL IntraVENous PRN    ondansetron (ZOFRAN-ODT) disintegrating tablet 4 mg  4 mg Oral Q8H PRN    Or    ondansetron (ZOFRAN) injection 4 mg  4 mg IntraVENous Q6H PRN    enoxaparin Sodium (LOVENOX) injection 30 mg  30 mg SubCUTAneous BID    hydrALAZINE (APRESOLINE) injection 10 mg  10 mg IntraVENous Q6H PRN    bupivacaine  (PF) (MARCAINE) 0.5 % injection 150 mg  30 mL IntraDERmal Once    iopamidol (ISOVUE-370) 76 % injection 100 mL  100 mL IntraVENous ONCE PRN    glucose chewable tablet 16 g  4 tablet Oral PRN    dextrose bolus 10% 125 mL  125 mL IntraVENous PRN    Or    dextrose bolus 10% 250 mL  250 mL IntraVENous PRN    dextrose 10 % infusion   IntraVENous Continuous PRN    naloxone (NARCAN) injection 0.4 mg  0.4 mg IntraVENous PRN    sodium chloride flush 0.9 % injection 5-40 mL  5-40 mL IntraVENous 2 times per day    0.9 % sodium chloride infusion   IntraVENous PRN    polyethylene glycol (GLYCOLAX) packet 17 g  17 g Oral Daily PRN    acetaminophen (TYLENOL) tablet 650 mg  650 mg Oral Q6H PRN    Or    acetaminophen (TYLENOL) suppository 650 mg  650 mg Rectal Q6H PRN    dextrose bolus 10% 125 mL  125 mL IntraVENous PRN    Or    dextrose bolus 10% 250 mL  250 mL IntraVENous PRN    glucagon injection 1 mg  1 mg SubCUTAneous PRN    dextrose 10 % infusion   IntraVENous Continuous PRN    aspirin chewable tablet 81 mg  81 mg Oral Daily    citalopram (CELEXA) tablet 10 mg  10 mg Oral Daily    donepezil (ARICEPT) tablet 10 mg  10 mg Oral Nightly    memantine (NAMENDA) tablet 10 mg  10 mg Oral BID    rOPINIRole (REQUIP) tablet 1 mg  1 mg Oral Nightly            Lab Review:     Recent Labs     06/12/22  0217   WBC 7.8   HGB 11.4*   HCT 35.5   PLT 990*       Recent Labs  06/12/22  0217   NA 138   K 3.8   CL 106   CO2 27   BUN 19       No components found for: "GLPOC"

## 2022-06-13 NOTE — Plan of Care (Signed)
Problem: Skin/Tissue Integrity  Goal: Absence of new skin breakdown    Outcome: Progressing

## 2022-06-13 NOTE — Plan of Care (Signed)
Problem: Skin/Tissue Integrity  Goal: Absence of new skin breakdown  Description: 1.  Monitor for areas of redness and/or skin breakdown  2.  Assess vascular access sites hourly  3.  Every 4-6 hours minimum:  Change oxygen saturation probe site  4.  Every 4-6 hours:  If on nasal continuous positive airway pressure, respiratory therapy assess nares and determine need for appliance change or resting period.  06/13/2022 0519 by Lucilla Edin, Etta Grandchild, RN  Outcome: Progressing  06/13/2022 0518 by Lucilla Edin, Jianna Drabik, RN  Outcome: Progressing     Problem: Safety - Adult  Goal: Free from fall injury  06/13/2022 0519 by Lucilla Edin, Arzella Rehmann, RN  Outcome: Progressing  06/13/2022 0518 by Lucilla Edin, Lisa Blakeman, RN  Outcome: Progressing     Problem: Chronic Conditions and Co-morbidities  Goal: Patient's chronic conditions and co-morbidity symptoms are monitored and maintained or improved  06/13/2022 0519 by Lucilla Edin, Elster Corbello, RN  Outcome: Progressing  06/13/2022 0518 by Lucilla Edin, Emmauel Hallums, RN  Outcome: Progressing     Problem: Discharge Planning  Goal: Discharge to home or other facility with appropriate resources  06/13/2022 0519 by Lucilla Edin, Etta Grandchild, RN  Outcome: Progressing  06/13/2022 0518 by Lucilla Edin, Hallee Mckenny, RN  Outcome: Progressing     Problem: Neurosensory - Adult  Goal: Achieves stable or improved neurological status  06/13/2022 0519 by Lucilla Edin, Etta Grandchild, RN  Outcome: Progressing  06/13/2022 0518 by Lucilla Edin, Tamar Miano, RN  Outcome: Progressing     Problem: Respiratory - Adult  Goal: Achieves optimal ventilation and oxygenation  06/13/2022 0519 by Lucilla Edin, Hindy Perrault, RN  Outcome: Progressing  06/13/2022 0518 by Lucilla Edin, Yeng Perz, RN  Outcome: Progressing     Problem: Cardiovascular - Adult  Goal: Maintains optimal cardiac output and hemodynamic stability  06/13/2022 0519 by Lucilla Edin, Etta Grandchild, RN  Outcome: Progressing  06/13/2022 0518 by Lucilla Edin, Analleli Gierke,  RN  Outcome: Progressing     Problem: Skin/Tissue Integrity - Adult  Goal: Skin integrity remains intact  06/13/2022 0519 by Lucilla Edin, Genola Yuille, RN  Outcome: Progressing  06/13/2022 0518 by Lucilla Edin, Hudsen Fei, RN  Outcome: Progressing     Problem: Musculoskeletal - Adult  Goal: Return mobility to safest level of function  06/13/2022 0519 by Lucilla Edin, Malayia Spizzirri, RN  Outcome: Progressing  06/13/2022 0518 by Lucilla Edin, Keshanna Riso, RN  Outcome: Progressing     Problem: Gastrointestinal - Adult  Goal: Minimal or absence of nausea and vomiting  06/13/2022 0519 by Lucilla Edin, Neilah Fulwider, RN  Outcome: Progressing  06/13/2022 0518 by Lucilla Edin, Velmer Broadfoot, RN  Outcome: Progressing     Problem: Genitourinary - Adult  Goal: Absence of urinary retention  06/13/2022 0519 by Lucilla Edin, Etta Grandchild, RN  Outcome: Progressing  06/13/2022 0518 by Lucilla Edin, Elianis Fischbach, RN  Outcome: Progressing     Problem: Infection - Adult  Goal: Absence of infection at discharge  06/13/2022 0519 by Lucilla Edin, Etta Grandchild, RN  Outcome: Progressing  06/13/2022 0518 by Lucilla Edin, Lamaria Hildebrandt, RN  Outcome: Progressing     Problem: Metabolic/Fluid and Electrolytes - Adult  Goal: Electrolytes maintained within normal limits  06/13/2022 0519 by Lucilla Edin, Queena Monrreal, RN  Outcome: Progressing  06/13/2022 0518 by Lucilla Edin, Pernell Dikes, RN  Outcome: Progressing     Problem: Hematologic - Adult  Goal: Maintains hematologic stability  06/13/2022 0519 by Lucilla Edin, Avianah Pellman, RN  Outcome: Progressing  06/13/2022 0518 by Lucilla Edin, Hermione Havlicek, RN  Outcome: Progressing     Problem: Pain  Goal: Verbalizes/displays adequate  comfort level or baseline comfort level  06/13/2022 0519 by Heriberto Antigua, Jamarcus Laduke, RN  Outcome: Progressing  06/13/2022 0518 by Heriberto Antigua, Davis Ambrosini, RN  Outcome: Progressing     Problem: Nutrition Deficit:  Goal: Optimize nutritional status  06/13/2022 0519 by Heriberto Antigua, Jolly Mango, RN  Outcome: Progressing  06/13/2022  0518 by Heriberto Antigua, Ashtynn Berke, RN  Outcome: Progressing     Problem: ABCDS Injury Assessment  Goal: Absence of physical injury  Outcome: Progressing

## 2022-06-13 NOTE — Other (Addendum)
06/13/22 1507   Vital Signs   Temp 99 F (37.2 C)   Temp Source Oral   Pulse 78   Heart Rate Source Monitor   Respirations 14   BP (!) 88/55   MAP (Calculated) 66   MAP (mmHg) 67   BP Location Right upper arm   Patient Position Supine   Level of Consciousness 0   MEWS Score 1     Made Dr. Valetta Mole aware of BP at 1526 explained patient did receive pain medication, MD wants BP rechecked one hour from this one.    At 1613 made dr Valetta Mole aware that BP now  115/76

## 2022-06-14 NOTE — Wound Image (Signed)
Wound follow up on right ankle  Patient sitting inchair with legs elevated  Assessment:  Right ankle- 2x2x0.1cm dry appears resurfaced. Surrounded by resurfaced blister    Right lateral ankle-  Dry blister with flap re adhered    Treatment; moistened hydrafera , 4x4's and ace wrap.  Reviewed with  Gladstone Lighter NP ortho , patient for surgery tomorrow.  Sheralyn Boatman RN  Wound

## 2022-06-14 NOTE — Progress Notes (Signed)
Hospitalist Progress Note      NAME:  Meghan [name]   DOB:  1945-10-09  MRM:  [id number]    Date/Time: 06/14/2022  4:36 PM           Assessment / Plan:     76 yo hx of HTN, Parkinson's dementia, seizure d/o, presented w/ AMS, hypoglycemia, hypotension.  Hospital course complicated by fall, R ankle fracture s/p external fixation 10/16     #R ankle fracture/pain: due to GLF.  S/p external fixation by Dr. Dema Severin on 10/16.  S/p bedside debridement of blister on 10/20.    - ORIF tmr     #Acute met encephalopathy: now stable.  Likely from hypoglycemia, hypotension.         #Hypotension/hypoglycemia: now resolved.    ACTH stim test neg for adrenal insuff.        #AKI: resolved with IVF     #HTN: BP stable.  Cont lisinopril.  Use IV hydralazine prn     # DM type 2 w/ diabetic neuropathy: A1C 5.6%.  Off insulin due to hypoglycemia.  Diet control        #Parkinson's dementia/depression seems to be at baseline.  Cont Namenda, Aricept, celexa      #Seizure d/o: Cont Vimpat, depakote       I have personally reviewed the radiographs, laboratory data in Epic and decisions and statements above are based partially on this personal interpretation.                 Care Plan discussed with: Patient    Discussed:  Care Plan    Prophylaxis:  Lovenox    Disposition:  SNF/LTC           ___________________________________________________    Attending Physician: Lyn Hollingshead, MD        Subjective:   No acute events overnight. Feels fine. OR tmr; snf friday    ROS:  (bold if positive, if negative)    Tolerating PT  Tolerating Diet          Objective:         Vitals:          Last 24hrs VS reviewed since prior progress note. Most recent are:    Vitals:    06/14/22 1528   BP: (!) 102/57   Pulse: 79   Resp: 17   Temp: 98.6 F (37 C)   SpO2: 95%     SpO2 Readings from Last 6 Encounters:   06/14/22 95%   03/25/22 94%   03/25/22 99%          Intake/Output Summary (Last 24 hours) at 06/14/2022 1636  Last data filed at 06/14/2022 1515  Gross per  24 hour   Intake 360 ml   Output 400 ml   Net -40 ml            Exam:     Physical Exam:    Gen:  elderly, frail, ill-appearing, NAD  HEENT:  Pink conjunctivae, PERRL, hearing intact to voice, moist mucous membranes  Neck:  Supple, without masses, thyroid non-tender  Resp:  No accessory muscle use, clear breath sounds without wheezes rales or rhonchi  Card:  No murmurs, normal S1, S2 without thrills, bruits or peripheral edema  Abd:  Soft, non-tender, non-distended, normoactive bowel sounds are present  Musc:  No cyanosis or clubbing.  External fixation present   Skin:  RLE warpped; no surrounding erythema  Neuro:  moves all ext, generalized weakness  Psych:  poor insight, oriented to person      Medications Reviewed: (see below)    Lab Data Reviewed: (see below)    ______________________________________________________________________    Medications:     Current Facility-Administered Medications   Medication Dose Route Frequency    HYDROcodone-acetaminophen (NORCO) 5-325 MG per tablet 2 tablet  2 tablet Oral Q6H PRN    HYDROmorphone HCl PF (DILAUDID) injection 1 mg  1 mg IntraVENous Q4H PRN    lisinopril (PRINIVIL;ZESTRIL) tablet 10 mg  10 mg Oral Daily    acidophilus probiotic capsule 1 capsule  1 capsule Oral Daily    divalproex (DEPAKOTE) DR tablet 500 mg  500 mg Oral 2 times per day    lacosamide (VIMPAT) tablet 100 mg  100 mg Oral BID    sodium chloride flush 0.9 % injection 5-40 mL  5-40 mL IntraVENous 2 times per day    sodium chloride flush 0.9 % injection 5-40 mL  5-40 mL IntraVENous PRN    ondansetron (ZOFRAN-ODT) disintegrating tablet 4 mg  4 mg Oral Q8H PRN    Or    ondansetron (ZOFRAN) injection 4 mg  4 mg IntraVENous Q6H PRN    [Held by provider] enoxaparin Sodium (LOVENOX) injection 30 mg  30 mg SubCUTAneous BID    hydrALAZINE (APRESOLINE) injection 10 mg  10 mg IntraVENous Q6H PRN    bupivacaine (PF) (MARCAINE) 0.5 % injection 150 mg  30 mL IntraDERmal Once    iopamidol (ISOVUE-370) 76 % injection  100 mL  100 mL IntraVENous ONCE PRN    glucose chewable tablet 16 g  4 tablet Oral PRN    dextrose bolus 10% 125 mL  125 mL IntraVENous PRN    Or    dextrose bolus 10% 250 mL  250 mL IntraVENous PRN    dextrose 10 % infusion   IntraVENous Continuous PRN    naloxone (NARCAN) injection 0.4 mg  0.4 mg IntraVENous PRN    sodium chloride flush 0.9 % injection 5-40 mL  5-40 mL IntraVENous 2 times per day    0.9 % sodium chloride infusion   IntraVENous PRN    polyethylene glycol (GLYCOLAX) packet 17 g  17 g Oral Daily PRN    acetaminophen (TYLENOL) tablet 650 mg  650 mg Oral Q6H PRN    Or    acetaminophen (TYLENOL) suppository 650 mg  650 mg Rectal Q6H PRN    dextrose bolus 10% 125 mL  125 mL IntraVENous PRN    Or    dextrose bolus 10% 250 mL  250 mL IntraVENous PRN    glucagon injection 1 mg  1 mg SubCUTAneous PRN    dextrose 10 % infusion   IntraVENous Continuous PRN    [Held by provider] aspirin chewable tablet 81 mg  81 mg Oral Daily    citalopram (CELEXA) tablet 10 mg  10 mg Oral Daily    donepezil (ARICEPT) tablet 10 mg  10 mg Oral Nightly    memantine (NAMENDA) tablet 10 mg  10 mg Oral BID    rOPINIRole (REQUIP) tablet 1 mg  1 mg Oral Nightly            Lab Review:     Recent Labs     06/12/22  0217   WBC 7.8   HGB 11.4*   HCT 35.5   PLT 990*       Recent Labs     06/12/22  0217   NA 138   K 3.8  CL 106   CO2 27   BUN 19       No components found for: "GLPOC"

## 2022-06-14 NOTE — Plan of Care (Signed)
Problem: Occupational Therapy - Adult  Goal: By Discharge: Performs self-care activities at highest level of function for planned discharge setting.  See evaluation for individualized goals.  Description: FUNCTIONAL STATUS PRIOR TO ADMISSION:  Pt is independent with ADLs and mobility.      HOME SUPPORT: Patient is a poor historian, reporting she is visiting from out of town ([place]) and is staying with her sister here in [place]. Per chart she resides in a Group Home.    Occupational Therapy Goals:  Weekly re assessment 06/14/2022  1.  Patient will perform seated grooming with Supervision/Set-up within 7 day(s).  2.  Patient will perform lateral transfer to drop-arm chair/BSC with supervision in prep for OOB ADL engagementwithin 7 day(s).  3.  Patient will perform seated/standing bathing with Moderate Assist within 7 day(s).  4.  Patient will perform all aspects of toileting with Minimal Assist within 7 day(s).  5.  Patient will participate in upper extremity therapeutic exercise/activities with Supervision for 10 minutes within 7 day(s).    6.  Patient will utilize energy conservation and fall prevention techniques during functional activities with verbal cues within 7 day(s).      Weekly re assessment 10/252023- continue goals   Re-Evaluation 05/31/2022 - now RLE NWB s/p R ankle external fixation (sustained R ankle fracture during GLF this admission)  1.  Patient will perform seated grooming with Supervision/Set-up within 7 day(s).  2.  Patient will perform lateral transfer to drop-arm chair/BSC with Minimal Assist x1 in prep for OOB ADL engagementwithin 7 day(s).- MET  3.  Patient will perform seated/standing bathing with Moderate Assist within 7 day(s).  4.  Patient will perform stand-pivot toilet transfers with Moderate Assist and Assist x2 using RW within 7 day(s).  5.  Patient will perform all aspects of toileting with Minimal Assist within 7 day(s).  6.  Patient will participate in upper extremity  therapeutic exercise/activities with Supervision for 10 minutes within 7 day(s).    7.  Patient will utilize energy conservation and fall prevention techniques during functional activities with verbal cues within 7 day(s).    Initiated 05/24/2022  1.  Patient will perform grooming, standing at sink, with Modified Independence within 7 day(s).  2.  Patient will perform lower body dressing with Modified Independence within 7 day(s).  3.  Patient will perform bathing, sitting and standing PRN, with Supervision within 7 day(s).  4.  Patient will perform toilet transfers with Modified Independence  within 7 day(s).  5.  Patient will perform all aspects of toileting with Modified Independence within 7 day(s).  6.  Patient will participate in upper extremity therapeutic exercise/activities with Independence for 10 minutes within 7 day(s).    Outcome: Progressing   OCCUPATIONAL THERAPY TREATMENT: WEEKLY REASSESSMENT    Patient: Meghan [name] (76 y.o. female)  Date: 06/14/2022  Primary Diagnosis: Encephalopathy [G93.40]  Acute encephalopathy [G93.40]  Closed head injury, initial encounter [S09.90XA]  Unwitnessed fall [R29.6]  Procedure(s) (LRB):  ANKLE EXTERNAL FIXATOR APPLICATION RIGHT (Right) 16 Days Post-Op   Precautions: Weight Bearing, Fall Risk (NWB RLE) Right Lower Extremity Weight Bearing: Non Weight Bearing (external fixator)              Chart, occupational therapy assessment, plan of care, and goals were reviewed.    ASSESSMENT  Patient continues to benefit from skilled OT services and is progressing towards goals. Patient received in room sleeping.  Upon awakening she repots needs assist with bladder hygiene.  However, with assistance,  noted patient with soiled brief.  She is able to manage bladder hygiene but max assist for bowel hygiene. Patient able come to EOB with min assist for RLE and continued cues to complete. Patient HOH and noted with episodes of stopping during activity, requiring repeat  instruction.  Patient able to complete transfer to chair with lateral scooting technique at min assist, as she remains NWB to RLE.   Plan for surgery tomorrow.  Patient left in chair in NAD, LEs elevated, R leg on 2 pillows.               PLAN  Goals have been updated based on progression since last assessment.  Patient continues to benefit from skilled intervention to address the above impairments.    Recommendations and Planned Interventions:   self care training, therapeutic activities, functional mobility training, balance training, therapeutic exercise, endurance activities, patient education, home safety training, and family training/education    Frequency/Duration: OT Plan of Care: 5 times/week    Recommend with staff: OOB to chair, BSC transfers     Recommend next OT session: continue goals.    Recommendation for discharge: (in order for the patient to meet his/her long term goals): Therapy up to 5 days/week in Skilled nursing facility    Other factors to consider for discharge: no additional factors    IF patient discharges home will need the following DME:  TBD       SUBJECTIVE:   Patient stated "What you want me to do?."    OBJECTIVE DATA SUMMARY:   Cognitive/Behavioral Status:  Orientation  Overall Orientation Status: Within Normal Limits  Orientation Level: Oriented to person;Oriented to place  Cognition  Following Commands: Follows one step commands with repetition (HOH)    Functional Mobility and Transfers for ADLs:  Bed Mobility:  Bed Mobility Training  Bed Mobility Training: Yes  Overall Level of Assistance: Minimum assistance;Additional time  Interventions: Verbal cues;Manual cues  Rolling: Minimum assistance;Additional time (for RLE external fixator)  Supine to Sit: Minimum assistance;Additional time  Sit to Supine: Minimum assistance;Additional time  Scooting: Additional time;Minimum assistance (maximal verbal cueing to remain focused on task)     Transfers:   Transfer Training  Sliding Board:  Minimum assistance;Additional time;Assist X2 (lateral)    Balance:     Balance  Sitting: Intact  Sitting - Static: Good (unsupported)  Sitting - Dynamic: Good (unsupported);Fair (occasional)  Standing - Static: Constant support;Poor  Standing - Dynamic: Constant support;Poor    ADL Intervention:                                                         Toileting: Maximum assistance  Toileting Skilled Clinical Factors: bowel hygiene, patient participates in bladder hygiene from supine position- set up         Pain Rating:  /10   Pain Intervention(s):       Activity Tolerance:   Good and requires rest breaks  Please refer to the flowsheet for vital signs taken during this treatment.    After treatment:   Patient left in no apparent distress sitting up in chair, Call bell within reach, and Bed/ chair alarm activated    COMMUNICATION/EDUCATION:   The patient's plan of care was discussed with: physical therapist and registered nurse  Thank you for this referral.  Wadie Lessen, OTR/L  Minutes: 24

## 2022-06-14 NOTE — Progress Notes (Addendum)
Orthopedic NP Progress Note  Post Op Day: 16 Days Post-Op    June 14, 2022 1:54 PM     [name]    Attending Physician: Treatment Team: Attending Provider: Lyn Hollingshead, MD; Consulting Physician: Emelda Brothers, MD; Case Manager: Justice Deeds; Consulting Physician: Cathlyn Parsons, MD; Consulting Physician: Leron Croak, APRN - NP; Consulting Physician: Kathy Breach, MD; Surgeon: Beola Cord., MD; Consulting Physician: Bufford Buttner, MD; Patient Care Tech: Kela Millin; Registered Nurse: Rudene Re, RN; Patient Care Tech: Laurena Slimmer; Occupational Therapist: Wadie Lessen, OT; Physical Therapist: Gilles Chiquito, PT     Vital Signs:    Patient Vitals for the past 8 hrs:   BP Temp Temp src Pulse Resp SpO2   06/14/22 1112 (!) 104/56 98.2 F (36.8 C) Axillary 78 17 98 %   06/14/22 0732 137/65 98.2 F (36.8 C) Oral 84 18 100 %          Intake/Output:  11/01 0701 - 11/01 1900  In: 120 [P.O.:120]  Out: -   10/30 1901 - 11/01 0700  In: 0   Out: 700 [Urine:700]    Pain Control:        LAB:    Recent Labs     06/12/22  0217   HCT 35.5   HGB 11.4*     Lab Results   Component Value Date/Time    NA 138 06/12/2022 02:17 AM    K 3.8 06/12/2022 02:17 AM    CL 106 06/12/2022 02:17 AM    CO2 27 06/12/2022 02:17 AM    BUN 19 06/12/2022 02:17 AM       Subjective:  Meghan [name] is a 76 y.o. female s/p a  Procedure(s):  ANKLE EXTERNAL FIXATOR APPLICATION RIGHT   Procedure(s):  ANKLE EXTERNAL FIXATOR APPLICATION RIGHT. Tolerating diet. Pain is well managed              Objective: General: alert, cooperative, no distress.    Neuro/Vascular: CNS Intact.  Sensation stable. Brisk cap refill, 2+ pulses UE/LE  Musculoskeletal: RLE - skin improving, able to wiggle toes, NVI      Skin: warm and dry   Dressing - clean, dry and intact.              PT/OT:   Gait:                      Assessment:    s/p Procedure(s):  ANKLE EXTERNAL FIXATOR APPLICATION RIGHT    Principal Problem:     Encephalopathy  Active Problems:    AKI (acute kidney injury) ([place])    Hypoglycemia    Stroke (cerebrum) ([facility])    Complex partial seizure evolving to generalized seizure ([place])    Dementia without behavioral disturbance (H[facility])    HLD (hyperlipidemia)    Type 2 diabetes mellitus with diabetic neuropathy ([facility])    Closed fracture dislocation of ankle joint, right, initial encounter  Resolved Problems:    * No resolved hospital problems. *       Plan:   -  R trimal fracture s/p Ex Fix placement - pt was seen by wound care this AM, wound care completed, discussed status of the skin with Dr. Dema Severin will plan for surgery tomorrow, NPO after midnight and holding Lovenox tomorrow  - NWB RLE with PT/OT, ice and elevate ono 2-3 pillows at all times,  pain management as per orders   - made Dr. Valetta Mole  and CM aware of plan, if pt medically stable and pain well managed she could likely go to [facility] Friday after Ortho rounds on pt and clears for DC      1930 - attempted to reach without success son to inform him of plan for surgery tomorrow    Signed By: Luciano Cutter, APRN - NP    Orthopedic Nurse Practitioner

## 2022-06-14 NOTE — Plan of Care (Signed)
Problem: Physical Therapy - Adult  Goal: By Discharge: Performs mobility at highest level of function for planned discharge setting.  See evaluation for individualized goals.  Description: Physical Therapy Goals  Revised 06/12/22 - Continue with same goals     Revised 05/31/2022  1.  Patient will move from supine to sit and sit to supine  in bed with minimal assistance/contact guard assist within 7 day(s).  2.  Patient will do lateral transfer from bed to chair and chair to bed with minimal assistance/contact guard assist using the least restrictive device within 7 day(s).  3.  Patient will perform sit to stand with minimal assistance/contact guard assist within 7 day(s).  4.  Patient will ambulate with minimal assistance/contact guard assist for 5x2 feet with the least restrictive device within 7 day(s).        FUNCTIONAL STATUS PRIOR TO ADMISSION: Patient was modified independent using a rolling walker for functional mobility. The patient lives in [place] and is in town visiting her sister who lives locally.     HOME SUPPORT PRIOR TO ADMISSION: The patient lived with her significant other in [place] but did not require assistance. She reports 2 falls in the past month.     Physical Therapy Goals  Initiated 05/24/2022  1.  Patient will move from supine to sit and sit to supine, scoot up and down, and roll side to side in bed with independence within 7 day(s).    2.  Patient will perform sit to stand with contact guard assist within 7 day(s).  3.  Patient will transfer from bed to chair and chair to bed with supervision/set-up using the least restrictive device within 7 day(s).  4.  Patient will ambulate with supervision/set-up for 50 feet with the least restrictive device within 7 day(s).   5.  Patient will ascend/descend 4 stairs with 1-2 handrail(s) with contact guard assist within 7 day(s).    Outcome: Progressing   PHYSICAL THERAPY TREATMENT    Patient: Meghan [name] (76 y.o. female)  Date: 06/14/2022  Diagnosis:  Encephalopathy [G93.40]  Acute encephalopathy [G93.40]  Closed head injury, initial encounter [S09.90XA]  Unwitnessed fall [R29.6] Encephalopathy  Procedure(s) (LRB):  ANKLE EXTERNAL FIXATOR APPLICATION RIGHT (Right) 16 Days Post-Op  Precautions: Weight Bearing, Fall Risk (NWB RLE) Right Lower Extremity Weight Bearing: Non Weight Bearing (external fixator)                  ASSESSMENT:  Patient continues to benefit from skilled PT services and is progressing towards goals. Pt continues with RLE external fixator.  Removal with ORIF planned 11/3.  Pt very HOH stating needing to be changed since is soiled.  She reports being aware of need for toileting, yet "I dont want to bother the nurses."  Pt needing Min A with bed mobility and supine to sit.  Gait is not functional at this time being NWB with fixator in place.  Lateral transfer to drop arm recliner with Min A to manage RLE.  Pt needing maximal verbal cues to continue task to  completion.  BLE elevated.  RN informed of pt being up in recliner and needing A with lateral transfer back to bed.    Pt will likely need rehab upon discharge being NWB RLE.          PLAN:  Patient continues to benefit from skilled intervention to address the above impairments.  Continue treatment per established plan of care.    Recommendation for discharge: (in order for  the patient to meet his/her long term goals): Therapy up to 5 days/week in Skilled nursing facility    Other factors to consider for discharge: patient's current support system is unable to meet their requirements for physical assistance, poor safety awareness, impaired cognition, high risk for falls, not safe to be alone, concern for safely navigating or managing the home environment, and new weight bearing restrictions limiting activity or patient is unable to maintain    IF patient discharges home will need the following DME:  tbd       SUBJECTIVE:   Patient stated, "This is tiring."    OBJECTIVE DATA SUMMARY:   Critical  Behavior:  Orientation  Overall Orientation Status: Within Normal Limits  Orientation Level: Oriented to person;Oriented to place  Cognition  Following Commands: Follows one step commands with repetition [facility])    Functional Mobility Training:  Bed Mobility:  Bed Mobility Training  Overall Level of Assistance: Minimum assistance;Additional time  Interventions: Verbal cues;Manual cues  Rolling: Minimum assistance;Additional time (for RLE external fixator)  Supine to Sit: Minimum assistance;Additional time  Sit to Supine: Minimum assistance;Additional time  Scooting: Additional time;Minimum assistance (maximal verbal cueing to remain focused on task)  Transfers:  Transfer Training  Sliding Board: Minimum assistance;Additional time;Assist X2 (lateral)  Balance:  Balance  Sitting: Intact  Sitting - Static: Good (unsupported)  Sitting - Dynamic: Good (unsupported);Fair (occasional)  Standing - Static: Constant support;Poor  Standing - Dynamic: Constant support;Poor   Ambulation/Gait Training:   NWB RLE - unable                         Pain Rating:  RLE  Pain Intervention(s):   nursing notified, nursing notified and addressing, elevation, and repositioning    Activity Tolerance:   Fair     After treatment:   Patient left in no apparent distress sitting up in chair, Call bell within reach, Bed/ chair alarm activated, and Heels elevated for pressure relief      COMMUNICATION/EDUCATION:   The patient's plan of care was discussed with: occupational therapist, registered nurse, and case manager    Patient Education  Education Given To: Patient  Education Provided: Role of Therapy;Transfer Training;Equipment;Plan of Care;Fall Prevention Strategies  Education Provided Comments: Safety awareness with transfers and gait  Education Method: Verbal  Barriers to Learning: Cognition;Hearing  Education Outcome: Continued education needed      Alfonso Carden D Kelissa Merlin, PT  Minutes: 56

## 2022-06-14 NOTE — Plan of Care (Signed)
Problem: Skin/Tissue Integrity  Goal: Absence of new skin breakdown  Description: 1.  Monitor for areas of redness and/or skin breakdown  2.  Assess vascular access sites hourly  3.  Every 4-6 hours minimum:  Change oxygen saturation probe site  4.  Every 4-6 hours:  If on nasal continuous positive airway pressure, respiratory therapy assess nares and determine need for appliance change or resting period.  06/14/2022 0533 by Lucilla Edin, Valisha Heslin, RN  Outcome: Progressing  06/13/2022 2033 by Mercer Pod., RN  Outcome: Progressing     Problem: Safety - Adult  Goal: Free from fall injury  Outcome: Progressing     Problem: Chronic Conditions and Co-morbidities  Goal: Patient's chronic conditions and co-morbidity symptoms are monitored and maintained or improved  Outcome: Progressing     Problem: Discharge Planning  Goal: Discharge to home or other facility with appropriate resources  Outcome: Progressing     Problem: Neurosensory - Adult  Goal: Achieves stable or improved neurological status  Outcome: Progressing     Problem: Respiratory - Adult  Goal: Achieves optimal ventilation and oxygenation  Outcome: Progressing     Problem: Cardiovascular - Adult  Goal: Maintains optimal cardiac output and hemodynamic stability  Outcome: Progressing     Problem: Skin/Tissue Integrity - Adult  Goal: Skin integrity remains intact  Outcome: Progressing     Problem: Musculoskeletal - Adult  Goal: Return mobility to safest level of function  Outcome: Progressing     Problem: Gastrointestinal - Adult  Goal: Minimal or absence of nausea and vomiting  Outcome: Progressing     Problem: Genitourinary - Adult  Goal: Absence of urinary retention  Outcome: Progressing     Problem: Infection - Adult  Goal: Absence of infection at discharge  Outcome: Progressing     Problem: Metabolic/Fluid and Electrolytes - Adult  Goal: Electrolytes maintained within normal limits  Outcome: Progressing     Problem: Hematologic - Adult  Goal:  Maintains hematologic stability  Outcome: Progressing     Problem: Pain  Goal: Verbalizes/displays adequate comfort level or baseline comfort level  Outcome: Progressing     Problem: Nutrition Deficit:  Goal: Optimize nutritional status  Outcome: Progressing     Problem: ABCDS Injury Assessment  Goal: Absence of physical injury  Outcome: Progressing

## 2022-06-15 ENCOUNTER — Inpatient Hospital Stay: Admit: 2022-06-15 | Payer: MEDICARE | Primary: Internal Medicine

## 2022-06-15 LAB — POCT GLUCOSE: POC Glucose: 93 mg/dL (ref 65–117)

## 2022-06-15 MED ORDER — CEFAZOLIN SODIUM 1 G IJ SOLR
1 g | INTRAMUSCULAR | Status: DC | PRN
Start: 2022-06-15 — End: 2022-06-15
  Administered 2022-06-15: 20:00:00 2 via INTRAVENOUS

## 2022-06-15 MED ORDER — ROPIVACAINE HCL 5 MG/ML IJ SOLN
INTRAMUSCULAR | Status: DC | PRN
Start: 2022-06-15 — End: 2022-06-15
  Administered 2022-06-15: 19:00:00 10 via EPIDURAL
  Administered 2022-06-15: 19:00:00 30 via EPIDURAL

## 2022-06-15 MED ORDER — LACTATED RINGERS IV SOLN
INTRAVENOUS | Status: DC | PRN
Start: 2022-06-15 — End: 2022-06-15
  Administered 2022-06-15 (×2): via INTRAVENOUS

## 2022-06-15 MED ORDER — MIDAZOLAM HCL 2 MG/2ML IJ SOLN
2 MG/ML | INTRAMUSCULAR | Status: DC | PRN
Start: 2022-06-15 — End: 2022-06-15
  Administered 2022-06-15: 19:00:00 1 via INTRAVENOUS
  Administered 2022-06-15 (×2): .5 via INTRAVENOUS

## 2022-06-15 MED ORDER — OXYCODONE HCL 5 MG PO TABS
5 MG | ORAL | Status: DC | PRN
Start: 2022-06-15 — End: 2022-06-16

## 2022-06-15 MED ORDER — BUPIVACAINE 0.125% ELASTOMERIC INFUSION
Status: DC
Start: 2022-06-15 — End: 2022-06-16
  Administered 2022-06-15: 400 mL

## 2022-06-15 MED ORDER — FENTANYL CITRATE (PF) 100 MCG/2ML IJ SOLN
100 MCG/2ML | INTRAMUSCULAR | Status: DC | PRN
Start: 2022-06-15 — End: 2022-06-15
  Administered 2022-06-15 (×2): 50 via INTRAVENOUS

## 2022-06-15 MED ORDER — ONDANSETRON HCL 4 MG/2ML IJ SOLN
4 MG/2ML | Freq: Four times a day (QID) | INTRAMUSCULAR | Status: DC | PRN
Start: 2022-06-15 — End: 2022-06-16

## 2022-06-15 MED ORDER — MIDAZOLAM HCL 2 MG/2ML IJ SOLN
2 MG/ML | INTRAMUSCULAR | Status: AC
Start: 2022-06-15 — End: ?

## 2022-06-15 MED ORDER — NORMAL SALINE FLUSH 0.9 % IV SOLN
0.9 % | INTRAVENOUS | Status: DC | PRN
Start: 2022-06-15 — End: 2022-06-16

## 2022-06-15 MED ORDER — ONDANSETRON HCL 4 MG/2ML IJ SOLN
4 MG/2ML | INTRAMUSCULAR | Status: DC | PRN
Start: 2022-06-15 — End: 2022-06-15
  Administered 2022-06-15: 21:00:00 4 via INTRAVENOUS

## 2022-06-15 MED ORDER — PROPOFOL 200 MG/20ML IV EMUL
200 MG/20ML | INTRAVENOUS | Status: DC | PRN
Start: 2022-06-15 — End: 2022-06-15
  Administered 2022-06-15: 20:00:00 50 via INTRAVENOUS
  Administered 2022-06-15: 20:00:00 32 via INTRAVENOUS
  Administered 2022-06-15: 20:00:00 16 via INTRAVENOUS
  Administered 2022-06-15: 20:00:00 120 via INTRAVENOUS
  Administered 2022-06-15: 20:00:00 80 via INTRAVENOUS
  Administered 2022-06-15: 20:00:00 100 via INTRAVENOUS

## 2022-06-15 MED ORDER — EPHEDRINE SULFATE-NACL 50-0.9 MG/5ML-% IV SOSY
INTRAVENOUS | Status: DC | PRN
Start: 2022-06-15 — End: 2022-06-15
  Administered 2022-06-15: 20:00:00 15 via INTRAVENOUS

## 2022-06-15 MED ORDER — ONDANSETRON 4 MG PO TBDP
4 MG | Freq: Three times a day (TID) | ORAL | Status: DC | PRN
Start: 2022-06-15 — End: 2022-06-16

## 2022-06-15 MED ORDER — SODIUM CHLORIDE 0.9 % IV SOLN
0.9 % | INTRAVENOUS | Status: DC | PRN
Start: 2022-06-15 — End: 2022-06-16

## 2022-06-15 MED ORDER — CEFAZOLIN SODIUM 1 G IJ SOLR
1 g | Freq: Three times a day (TID) | INTRAMUSCULAR | Status: AC
Start: 2022-06-15 — End: 2022-06-16
  Administered 2022-06-16 (×2): 2000 mg via INTRAVENOUS

## 2022-06-15 MED ORDER — FENTANYL CITRATE (PF) 100 MCG/2ML IJ SOLN
100 MCG/2ML | INTRAMUSCULAR | Status: AC
Start: 2022-06-15 — End: ?

## 2022-06-15 MED ORDER — MORPHINE SULFATE (PF) 2 MG/ML IV SOLN
2 MG/ML | INTRAVENOUS | Status: DC | PRN
Start: 2022-06-15 — End: 2022-06-16

## 2022-06-15 MED ORDER — ENOXAPARIN SODIUM 30 MG/0.3ML IJ SOSY
30 MG/0.3ML | Freq: Two times a day (BID) | INTRAMUSCULAR | Status: DC
Start: 2022-06-15 — End: 2022-06-16
  Administered 2022-06-16: 12:00:00 30 mg via SUBCUTANEOUS

## 2022-06-15 MED ORDER — NORMAL SALINE FLUSH 0.9 % IV SOLN
0.9 % | Freq: Two times a day (BID) | INTRAVENOUS | Status: DC
Start: 2022-06-15 — End: 2022-06-16
  Administered 2022-06-16 (×2): 10 mL via INTRAVENOUS

## 2022-06-15 MED ORDER — PHENYLEPHRINE HCL 10 MG/ML SOLN (MIXTURES ONLY)
10 MG/ML | INTRAVENOUS | Status: DC | PRN
Start: 2022-06-15 — End: 2022-06-15
  Administered 2022-06-15: 21:00:00 15 via INTRAVENOUS

## 2022-06-15 MED ORDER — DEXAMETHASONE 4 MG/ML IJ SOLN (MIXTURES ONLY)
4 MG/ML | INTRAMUSCULAR | Status: DC | PRN
Start: 2022-06-15 — End: 2022-06-15
  Administered 2022-06-15: 20:00:00 8 via INTRAVENOUS

## 2022-06-15 MED FILL — BUPIVACAINE 0.125% ELASTOMERIC INFUSION: Qty: 400

## 2022-06-15 MED FILL — BD POSIFLUSH 0.9 % IV SOLN: 0.9 % | INTRAVENOUS | Qty: 40

## 2022-06-15 MED FILL — FENTANYL CITRATE (PF) 100 MCG/2ML IJ SOLN: 100 MCG/2ML | INTRAMUSCULAR | Qty: 2

## 2022-06-15 MED FILL — MIDAZOLAM HCL 2 MG/2ML IJ SOLN: 2 MG/ML | INTRAMUSCULAR | Qty: 2

## 2022-06-15 NOTE — Care Coordination-Inpatient (Signed)
Transitions of Care  Pt was initially planned to discharge to [facility]. Family no wants Pt to discharge to a [place] SNF  Referral sent to [facility] in [place]  CM to follow-up on discharge plan.         1-4:30pm: CM spoke with Pt's daughter in law. Daughter in-law has shared that they would prefer for the Pt to discharge to a SNF in [place] instead of initially planned SNF ([facility]). Daughter in-law state that it would be easier for the Pt to establish residence in [place] while she's in SNF.     DIL is aware that the anticipated discharge is for tomorrow 11/3. DIL presented understanding and stated that she and the Pt's son are prepared to pick-up Pt tomorrow if Pt is accepted to facility in [place].     Family is aware that Pt would also need to have an orthopedic follow-up after discharge. DIL requested if someone could assist with arranging a follow-up with a [place] [facility] orthopedic physician.     DIL would like CM to send a referral to [facility].     CM contacted admissions at the facility: [phone]. CM was instructed to fax Pt's clinicals to: [place], [place], [place]  [phone]

## 2022-06-15 NOTE — Progress Notes (Signed)
Received patient at start of shift without IVA. Off going nurse stated attempt made multiple times to give IVA but was unsuccessful. Patient was later approached to attempt IVA by this writer, however patient refused saying she wanted to sleep. RRTRN on night shift notified of need for IVA and stated he would see patient. Later RRTRN was again notied and said he would pass it on to oncoming RRTRN. Patient handed over to dayshift staff without IVA.

## 2022-06-15 NOTE — Progress Notes (Signed)
Hospitalist Progress Note      NAME:  Meghan [name]   DOB:  [birth year]  MRM:  [id number]    Date/Time: 06/15/2022  10:03 AM           Assessment / Plan:     76 yo hx of HTN, Parkinson's dementia, seizure d/o, presented w/ AMS, hypoglycemia, hypotension.  Hospital course complicated by fall, R ankle fracture s/p external fixation 10/16     #R ankle fracture/pain: due to GLF.  S/p external fixation by Dr. Dema Severin on 10/16.  S/p bedside debridement of blister on 10/20.    - ORIF today     #Acute met encephalopathy: now stable.  Likely from hypoglycemia, hypotension.         #Hypotension/hypoglycemia: now resolved.    ACTH stim test neg for adrenal insuff.        #AKI: resolved with IVF     #HTN: BP stable.  Cont lisinopril.  Use IV hydralazine prn     # DM type 2 w/ diabetic neuropathy: A1C 5.6%.  Off insulin due to hypoglycemia.  Diet control        #Parkinson's dementia/depression seems to be at baseline.  Cont Namenda, Aricept, celexa      #Seizure d/o: Cont Vimpat, depakote       I have personally reviewed the radiographs, laboratory data in Epic and decisions and statements above are based partially on this personal interpretation.                 Care Plan discussed with: Patient    Discussed:  Care Plan    Prophylaxis:  Lovenox    Disposition:  SNF/LTC           ___________________________________________________    Attending Physician: Lyn Hollingshead, MD        Subjective:   No acute events overnight. Feels fine. OR today    ROS:  (bold if positive, if negative)    Tolerating PT  Tolerating Diet          Objective:         Vitals:          Last 24hrs VS reviewed since prior progress note. Most recent are:    Vitals:    06/15/22 0739   BP: 106/73   Pulse: 73   Resp: 16   Temp: 98.2 F (36.8 C)   SpO2: 95%     SpO2 Readings from Last 6 Encounters:   06/15/22 95%   03/25/22 94%   03/25/22 99%          Intake/Output Summary (Last 24 hours) at 06/15/2022 1003  Last data filed at 06/15/2022 0353  Gross per 24 hour    Intake 240 ml   Output 350 ml   Net -110 ml            Exam:     Physical Exam:    Gen:  elderly, frail, ill-appearing, NAD  HEENT:  Pink conjunctivae, PERRL, hearing intact to voice, moist mucous membranes  Neck:  Supple, without masses, thyroid non-tender  Resp:  No accessory muscle use, clear breath sounds without wheezes rales or rhonchi  Card:  No murmurs, normal S1, S2 without thrills, bruits or peripheral edema  Abd:  Soft, non-tender, non-distended, normoactive bowel sounds are present  Musc:  No cyanosis or clubbing.  External fixation present   Skin:  RLE warpped; no surrounding erythema  Neuro:  moves all ext, generalized weakness  Psych:  poor  insight, oriented to person      Medications Reviewed: (see below)    Lab Data Reviewed: (see below)    ______________________________________________________________________    Medications:     Current Facility-Administered Medications   Medication Dose Route Frequency    HYDROcodone-acetaminophen (NORCO) 5-325 MG per tablet 2 tablet  2 tablet Oral Q6H PRN    HYDROmorphone HCl PF (DILAUDID) injection 1 mg  1 mg IntraVENous Q4H PRN    lisinopril (PRINIVIL;ZESTRIL) tablet 10 mg  10 mg Oral Daily    acidophilus probiotic capsule 1 capsule  1 capsule Oral Daily    divalproex (DEPAKOTE) DR tablet 500 mg  500 mg Oral 2 times per day    lacosamide (VIMPAT) tablet 100 mg  100 mg Oral BID    sodium chloride flush 0.9 % injection 5-40 mL  5-40 mL IntraVENous 2 times per day    sodium chloride flush 0.9 % injection 5-40 mL  5-40 mL IntraVENous PRN    ondansetron (ZOFRAN-ODT) disintegrating tablet 4 mg  4 mg Oral Q8H PRN    Or    ondansetron (ZOFRAN) injection 4 mg  4 mg IntraVENous Q6H PRN    [Held by provider] enoxaparin Sodium (LOVENOX) injection 30 mg  30 mg SubCUTAneous BID    hydrALAZINE (APRESOLINE) injection 10 mg  10 mg IntraVENous Q6H PRN    bupivacaine (PF) (MARCAINE) 0.5 % injection 150 mg  30 mL IntraDERmal Once    iopamidol (ISOVUE-370) 76 % injection 100 mL   100 mL IntraVENous ONCE PRN    glucose chewable tablet 16 g  4 tablet Oral PRN    dextrose bolus 10% 125 mL  125 mL IntraVENous PRN    Or    dextrose bolus 10% 250 mL  250 mL IntraVENous PRN    dextrose 10 % infusion   IntraVENous Continuous PRN    naloxone (NARCAN) injection 0.4 mg  0.4 mg IntraVENous PRN    sodium chloride flush 0.9 % injection 5-40 mL  5-40 mL IntraVENous 2 times per day    0.9 % sodium chloride infusion   IntraVENous PRN    polyethylene glycol (GLYCOLAX) packet 17 g  17 g Oral Daily PRN    acetaminophen (TYLENOL) tablet 650 mg  650 mg Oral Q6H PRN    Or    acetaminophen (TYLENOL) suppository 650 mg  650 mg Rectal Q6H PRN    dextrose bolus 10% 125 mL  125 mL IntraVENous PRN    Or    dextrose bolus 10% 250 mL  250 mL IntraVENous PRN    glucagon injection 1 mg  1 mg SubCUTAneous PRN    dextrose 10 % infusion   IntraVENous Continuous PRN    [Held by provider] aspirin chewable tablet 81 mg  81 mg Oral Daily    citalopram (CELEXA) tablet 10 mg  10 mg Oral Daily    donepezil (ARICEPT) tablet 10 mg  10 mg Oral Nightly    memantine (NAMENDA) tablet 10 mg  10 mg Oral BID    rOPINIRole (REQUIP) tablet 1 mg  1 mg Oral Nightly            Lab Review:     No results for input(s): "WBC", "HGB", "HCT", "PLT" in the last 72 hours.    No results for input(s): "NA", "K", "CL", "CO2", "GLU", "BUN", "CREA", "CA", "MG", "PHOS", "ALB", "ALT", "INR" in the last 72 hours.    Invalid input(s): "TBIL", "SGOT"    No components found for: "  [place]"

## 2022-06-15 NOTE — Anesthesia Procedure Notes (Cosign Needed)
Peripheral Block    Patient location during procedure: pre-op  Reason for block: procedure for pain, post-op pain management, primary anesthetic and at surgeon's request  Start time: 06/15/2022 2:34 PM  End time: 06/15/2022 2:54 PM  Staffing  Performed: anesthesiologist   Anesthesiologist: Glendora Score, MD  Performed by: Glendora Score, MD  Authorized by: Glendora Score, MD    Preanesthetic Checklist  Completed: patient identified, IV checked, site marked, risks and benefits discussed, surgical/procedural consents, timeout performed, anesthesia consent given, oxygen available and monitors applied/VS acknowledged  Peripheral Block   Patient position: supine  Prep: ChloraPrep  Provider prep: mask and sterile gloves  Patient monitoring: cardiac monitor, continuous pulse ox, continuous capnometry, frequent blood pressure checks, IV access, oxygen and responsive to questions  Block type: Sciatic and Saphenous  Laterality: right  Injection technique: catheter  Guidance: ultrasound guided    Needle   Needle type: Tuohy   Needle gauge: 18 G  Needle localization: ultrasound guidance  Test dose: negative  Needle length: 10 cm  Assessment   Injection assessment: negative aspiration for heme, no paresthesia on injection, local visualized surrounding nerve on ultrasound and no intravascular symptoms  Hemodynamics: stable  Outcomes: patient tolerated procedure well    Additional Notes  Saphenous block performed with ultrasound guidance; 4" stimuplex 21g needle used, 10cc 0.5% ropivacaine injected slowly with intermittent aspiration.    Tam RN witnessed timeout and block written on correct side.
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
## 2022-06-15 NOTE — Plan of Care (Signed)
Problem: Physical Therapy - Adult  Goal: By Discharge: Performs mobility at highest level of function for planned discharge setting.  See evaluation for individualized goals.  Description: Physical Therapy Goals  Revised 06/12/22 - Continue with same goals     Revised 05/31/2022  1.  Patient will move from supine to sit and sit to supine  in bed with minimal assistance/contact guard assist within 7 day(s).  2.  Patient will do lateral transfer from bed to chair and chair to bed with minimal assistance/contact guard assist using the least restrictive device within 7 day(s).  3.  Patient will perform sit to stand with minimal assistance/contact guard assist within 7 day(s).  4.  Patient will ambulate with minimal assistance/contact guard assist for 5x2 feet with the least restrictive device within 7 day(s).        FUNCTIONAL STATUS PRIOR TO ADMISSION: Patient was modified independent using a rolling walker for functional mobility. The patient lives in [place] and is in town visiting her sister who lives locally.     HOME SUPPORT PRIOR TO ADMISSION: The patient lived with her significant other in [place] but did not require assistance. She reports 2 falls in the past month.     Physical Therapy Goals  Initiated 05/24/2022  1.  Patient will move from supine to sit and sit to supine, scoot up and down, and roll side to side in bed with independence within 7 day(s).    2.  Patient will perform sit to stand with contact guard assist within 7 day(s).  3.  Patient will transfer from bed to chair and chair to bed with supervision/set-up using the least restrictive device within 7 day(s).  4.  Patient will ambulate with supervision/set-up for 50 feet with the least restrictive device within 7 day(s).   5.  Patient will ascend/descend 4 stairs with 1-2 handrail(s) with contact guard assist within 7 day(s).    Outcome: Progressing   PHYSICAL THERAPY TREATMENT    Patient: Meghan [name] (76 y.o. female)  Date: 06/15/2022  Diagnosis:  Encephalopathy [G93.40]  Acute encephalopathy [G93.40]  Closed head injury, initial encounter [S09.90XA]  Unwitnessed fall [R29.6] Encephalopathy  Procedure(s) (LRB):  ANKLE EXTERNAL FIXATOR APPLICATION RIGHT (Right) 17 Days Post-Op  Precautions: Weight Bearing, Fall Risk (NWB RLE) Right Lower Extremity Weight Bearing: Non Weight Bearing (external fixator)                  ASSESSMENT:  Patient continues to benefit from skilled PT services and is progressing towards goals. Patient to have Right ORIF surgery this afternoon.  Patient performed transfer training for supine to sit and scooting from the bed to the bedside chair with bedside chair left arm dropped.  Needs step by step commands for technique.  Demonstrates good dynamic sitting balance and good arm strength.       PLAN:  Patient continues to benefit from skilled intervention to address the above impairments.  Continue treatment per established plan of care.    Recommendation for discharge: (in order for the patient to meet his/her long term goals): rehab    Other factors to consider for discharge: high risk for falls and not safe to be alone    IF patient discharges home will need the following DME: continuing to assess with progress       SUBJECTIVE:   Patient stated, "I'm ok."    OBJECTIVE DATA SUMMARY:   Critical Behavior:          Functional Mobility  Training:  Bed Mobility:  Bed Mobility Training  Overall Level of Assistance: Minimum assistance;Assist X1;Additional time  Rolling: Minimum assistance;Assist X1  Supine to Sit: Minimum assistance;Assist X1;Additional time  Transfers:  Transfer Training  Bed to Chair: Moderate assistance;Additional time;Adaptive equipment  Balance:  Balance  Sitting: Intact  Sitting - Static: Good (unsupported)  Sitting - Dynamic: Good (unsupported)   Ambulation/Gait Training:          Pain Rating:  Complains of discomfort with pressure under right calf  Pain Intervention(s):   elevation and repositioning    Activity  Tolerance:   Good    After treatment:   Patient left in no apparent distress sitting up in chair, Call bell within reach, and Bed/ chair alarm activated      COMMUNICATION/EDUCATION:   The patient's plan of care was discussed with: occupational therapist and registered nurse    Patient Education  Education Given To: Patient  Education Provided: Transfer Training;Equipment  Education Method: Demonstration;Verbal  Barriers to Learning: Cognition;Hearing  Education Outcome: Continued education needed      Brita Romp, PT  Minutes: 24

## 2022-06-15 NOTE — Plan of Care (Signed)
Problem: Physical Therapy - Adult  Goal: By Discharge: Performs mobility at highest level of function for planned discharge setting.  See evaluation for individualized goals.  Description: Physical Therapy Goals  Revised 06/12/22 - Continue with same goals     Revised 05/31/2022  1.  Patient will move from supine to sit and sit to supine  in bed with minimal assistance/contact guard assist within 7 day(s).  2.  Patient will do lateral transfer from bed to chair and chair to bed with minimal assistance/contact guard assist using the least restrictive device within 7 day(s).  3.  Patient will perform sit to stand with minimal assistance/contact guard assist within 7 day(s).  4.  Patient will ambulate with minimal assistance/contact guard assist for 5x2 feet with the least restrictive device within 7 day(s).        FUNCTIONAL STATUS PRIOR TO ADMISSION: Patient was modified independent using a rolling walker for functional mobility. The patient lives in [place] and is in town visiting her sister who lives locally.     HOME SUPPORT PRIOR TO ADMISSION: The patient lived with her significant other in [place] but did not require assistance. She reports 2 falls in the past month.     Physical Therapy Goals  Initiated 05/24/2022  1.  Patient will move from supine to sit and sit to supine, scoot up and down, and roll side to side in bed with independence within 7 day(s).    2.  Patient will perform sit to stand with contact guard assist within 7 day(s).  3.  Patient will transfer from bed to chair and chair to bed with supervision/set-up using the least restrictive device within 7 day(s).  4.  Patient will ambulate with supervision/set-up for 50 feet with the least restrictive device within 7 day(s).   5.  Patient will ascend/descend 4 stairs with 1-2 handrail(s) with contact guard assist within 7 day(s).  PHYSICAL THERAPY TREATMENT    Patient: Meghan [name] (76 y.o. female)  Date: 06/15/2022  Diagnosis: Encephalopathy  [G93.40]  Acute encephalopathy [G93.40]  Closed head injury, initial encounter [S09.90XA]  Unwitnessed fall [R29.6] Encephalopathy  Procedure(s) (LRB):  ANKLE EXTERNAL FIXATOR APPLICATION RIGHT (Right) 17 Days Post-Op  Precautions: Weight Bearing, Fall Risk (NWB RLE) Right Lower Extremity Weight Bearing: Non Weight Bearing (external fixator)                  ASSESSMENT:  Patient continues to benefit from skilled PT services and is progressing towards goals. Patient to have Right ORIF surgery this afternoon.  Patient performed transfer training for sit to supine and scooting from the bedside chair to the bed with bedside chair left arm dropped.  Needs step by step commands for technique.  Demonstrates good dynamic sitting balance and good arm strength.         PLAN:  Patient continues to benefit from skilled intervention to address the above impairments.  Continue treatment per established plan of care.    Recommendation for discharge: (in order for the patient to meet his/her long term goals): Rehab    Other factors to consider for discharge: impaired cognition, high risk for falls, and new weight bearing restrictions limiting activity or patient is unable to maintain    IF patient discharges home will need the following DME: continuing to assess with progress       SUBJECTIVE:   Patient stated, "I'm going to surgery?."    OBJECTIVE DATA SUMMARY:   Critical Behavior:  Functional Mobility Training:  Bed Mobility:  Bed Mobility Training  Overall Level of Assistance: Minimum assistance;Assist X1;Additional time  Rolling: Supervision  Supine to Sit: Minimum assistance;Assist X1;Additional time  Sit to Supine: Minimum assistance;Assist X1  Scooting: Minimum assistance;Assist X1  Transfers:  Transfer Training  Bed to Chair: Minimum assistance;Additional time  Balance:  Balance  Sitting: Intact  Sitting - Static: Good (unsupported)  Sitting - Dynamic: Good (unsupported)              Pain Rating:  No  complaints    Activity Tolerance:   Good    After treatment:   Patient left in no apparent distress in bed  with surgical team heading to OR      COMMUNICATION/EDUCATION:   The patient's plan of care was discussed with: physical therapist and registered nurse    Patient Education  Education Given To: Patient  Education Provided: Transfer Training;Equipment  Education Method: Demonstration;Verbal  Barriers to Learning: Cognition;Hearing  Education Outcome: Continued education needed      Jaclyn Prime, PT  Minutes: 10

## 2022-06-15 NOTE — Other (Addendum)
[employer] Tourniquet On:1624pm  Tourniquet down at [facility]

## 2022-06-15 NOTE — Brief Op Note (Addendum)
Brief Postoperative Note      Patient: Meghan [name]  Date of Birth: 03-19-1946  MRN: [id number]    Date of Procedure: Jul 03, 2022    Pre-Op Diagnosis Codes:     * Closed trimalleolar fracture of right ankle, initial encounter [S82.851A]    Post-Op Diagnosis: Same       Procedure:  REMOVAL OF RIGHT ANKLE EXTERNAL FIXATOR  OPEN REDUCTION INTERNAL FIXATION RIGHT TRIMALLEOLAR ANKLE FRACTURE  INDEPENDENT INTERPRETATION OF INTRA-OPERATIVE FLUOROSCOPY      Surgeon(s):  Beola Cord., MD    Assistant:  Physician Assistant: Greggory Keen, PA-C    During the procedure Valentino Nose, PA-C performed the duties of positioning, retraction, assistance with limb and implant management, closure and dressing application      Anesthesia: general  with regional    Estimated Blood Loss (mL): less than 5cc     Complications: None    Specimens:   * No specimens in log *    Implants:  * No implants in log *  Stryker lateral plate/screws  Arthrex medial hook plate/screws  Stryker 4.64mm Cannulated screws x 2      Drains:   External Urinary Catheter (Active)   Site Assessment Clean,dry & intact 07-03-2022 0800   Placement Replaced 06/14/22 1937   Securement Method Other (Comment) 06/07/22 2159   Catheter Care Catheter/Wick replaced 06/14/22 1937   Perineal Care Yes 06/13/22 0113   Suction 40 mmgHg continuous 06/10/22 0443   Urine Color Yellow 06/14/22 1515   Urine Appearance Hazy 06/14/22 1515   Urine Odor Malodorous 06/14/22 1515   Output (mL) 350 mL 06/14/22 1937       PICO incisional wound vac on medial and lateral wounds    Findings: RIGHT trimal ankle fx    TT: esmarch calf x 70 min      Electronically signed by Loraine Maple, MD on 07/03/22 at 5:23 PM

## 2022-06-15 NOTE — Plan of Care (Signed)
Problem: Occupational Therapy - Adult  Goal: By Discharge: Performs self-care activities at highest level of function for planned discharge setting.  See evaluation for individualized goals.  Description: FUNCTIONAL STATUS PRIOR TO ADMISSION:  Pt is independent with ADLs and mobility.      HOME SUPPORT: Patient is a poor historian, reporting she is visiting from out of town ([place]) and is staying with her sister here in [place]. Per chart she resides in a Group Home.    Occupational Therapy Goals:  Weekly re assessment 06/14/2022  1.  Patient will perform seated grooming with Supervision/Set-up within 7 day(s).  2.  Patient will perform lateral transfer to drop-arm chair/BSC with supervision in prep for OOB ADL engagementwithin 7 day(s).  3.  Patient will perform seated/standing bathing with Moderate Assist within 7 day(s).  4.  Patient will perform all aspects of toileting with Minimal Assist within 7 day(s).  5.  Patient will participate in upper extremity therapeutic exercise/activities with Supervision for 10 minutes within 7 day(s).    6.  Patient will utilize energy conservation and fall prevention techniques during functional activities with verbal cues within 7 day(s).      Weekly re assessment 10/252023- continue goals   Re-Evaluation 05/31/2022 - now RLE NWB s/p R ankle external fixation (sustained R ankle fracture during GLF this admission)  1.  Patient will perform seated grooming with Supervision/Set-up within 7 day(s).  2.  Patient will perform lateral transfer to drop-arm chair/BSC with Minimal Assist x1 in prep for OOB ADL engagementwithin 7 day(s).- MET  3.  Patient will perform seated/standing bathing with Moderate Assist within 7 day(s).  4.  Patient will perform stand-pivot toilet transfers with Moderate Assist and Assist x2 using RW within 7 day(s).  5.  Patient will perform all aspects of toileting with Minimal Assist within 7 day(s).  6.  Patient will participate in upper extremity  therapeutic exercise/activities with Supervision for 10 minutes within 7 day(s).    7.  Patient will utilize energy conservation and fall prevention techniques during functional activities with verbal cues within 7 day(s).    Initiated 05/24/2022  1.  Patient will perform grooming, standing at sink, with Modified Independence within 7 day(s).  2.  Patient will perform lower body dressing with Modified Independence within 7 day(s).  3.  Patient will perform bathing, sitting and standing PRN, with Supervision within 7 day(s).  4.  Patient will perform toilet transfers with Modified Independence  within 7 day(s).  5.  Patient will perform all aspects of toileting with Modified Independence within 7 day(s).  6.  Patient will participate in upper extremity therapeutic exercise/activities with Independence for 10 minutes within 7 day(s).    06/15/2022 1330 by Suezanne Cheshire C, OT  Outcome: Progressing  06/15/2022 1329 by Wadie Lessen, OT  Outcome: Progressing   OCCUPATIONAL THERAPY TREATMENT  Patient: Meghan [name] (76 y.o. female)  Date: 06/15/2022  Primary Diagnosis: Encephalopathy [G93.40]  Acute encephalopathy [G93.40]  Closed head injury, initial encounter [S09.90XA]  Unwitnessed fall [R29.6]  Procedure(s) (LRB):  ANKLE EXTERNAL FIXATOR APPLICATION RIGHT (Right) 17 Days Post-Op   Precautions: Weight Bearing, Fall Risk (NWB RLE) Right Lower Extremity Weight Bearing: Non Weight Bearing (external fixator)              Chart, occupational therapy assessment, plan of care, and goals were reviewed.    ASSESSMENT  Patient continues to benefit from skilled OT services and is progressing towards goals. Patient received supine in  bed with HOB elevated.  She reports eagerness for surgery later today.   Patient to EOB with min assist.  Patient requires verbal cues for sequencing tasks.  Min assist for scooting to chair from bed.  Patient left in NAD in chair.             PLAN :  Patient continues to benefit from  skilled intervention to address the above impairments.  Continue treatment per established plan of care to address goals.    Recommend with staff: BSC, OOB    Recommend next OT session: continue goals     Recommendation for discharge: (in order for the patient to meet his/her long term goals): Therapy up to 5 days/week in Skilled nursing facility    Other factors to consider for discharge: poor safety awareness, impaired cognition, high risk for falls, and not safe to be alone    IF patient discharges home will need the following DME:  TBD       SUBJECTIVE:   Patient stated "You want me to keep going?."    OBJECTIVE DATA SUMMARY:   Cognitive/Behavioral Status:          Functional Mobility and Transfers for ADLs:  Bed Mobility:  Bed Mobility Training  Overall Level of Assistance: Minimum assistance;Assist X1;Additional time  Rolling: Minimum assistance;Assist X1  Supine to Sit: Minimum assistance;Assist X1;Additional time  Scooting: Assist X2;Minimum assistance     Transfers:   Transfer Training  Bed to Chair: Minimum assistance;Additional time           Balance:     Balance  Sitting: Intact  Sitting - Static: Good (unsupported)  Sitting - Dynamic: Good (unsupported)      ADL Intervention:                                                                             Pain Rating:  None /10   Pain Intervention(s):   elevation and repositioning      Activity Tolerance:   Good  Please refer to the flowsheet for vital signs taken during this treatment.    After treatment:   Patient left in no apparent distress sitting up in chair, Call bell within reach, and Bed/ chair alarm activated    COMMUNICATION/EDUCATION:   The patient's plan of care was discussed with: physical therapist and registered nurse         Thank you for this referral.  Wadie Lessen, OTR/L  Minutes: 22

## 2022-06-15 NOTE — Plan of Care (Signed)
Problem: Skin/Tissue Integrity  Goal: Absence of new skin breakdown  Description: 1.  Monitor for areas of redness and/or skin breakdown  2.  Assess vascular access sites hourly  3.  Every 4-6 hours minimum:  Change oxygen saturation probe site  4.  Every 4-6 hours:  If on nasal continuous positive airway pressure, respiratory therapy assess nares and determine need for appliance change or resting period.  Outcome: Progressing     Problem: Safety - Adult  Goal: Free from fall injury  Outcome: Progressing     Problem: Skin/Tissue Integrity - Adult  Goal: Skin integrity remains intact  Outcome: Progressing     Problem: Musculoskeletal - Adult  Goal: Return mobility to safest level of function  Outcome: Progressing     Problem: Pain  Goal: Verbalizes/displays adequate comfort level or baseline comfort level  Outcome: Progressing

## 2022-06-15 NOTE — Other (Signed)
TRANSFER - OUT REPORT:    Verbal report given to PAULA on LALANA WACHTER  being transferred to 502 for routine progression of patient care         Report consisted of patient's Situation, Background, Assessment and   Recommendations(SBAR).     Information from the following report(s) Nurse Handoff Report, Surgery Report, and Neuro Assessment was reviewed with the receiving nurse.           Lines:   Peripheral IV 06/15/22 Right;Anterior External Jugular (Active)   Site Assessment Clean, dry & intact 06/15/22 1830   Phlebitis Assessment No symptoms 06/15/22 1830   Infiltration Assessment 0 06/15/22 1830   Dressing Status Clean;Dry;Intact 06/15/22 1830       Extended Dwell Peripherial IV 42/70/62 Left Basilic (Active)   Criteria for Appropriate Use Limited/no vessel suitable for conventional peripheral access 06/15/22 1830   Site Assessment Clean, dry & intact 06/15/22 1830   Phlebitis Assessment No symptoms 06/15/22 1830   Infiltration Assessment 0 06/15/22 1830   External Catheter Length (cm) 0 cm 06/15/22 0905   Line Status Brisk blood return 06/15/22 [place] checked and tightened;Cap changed 06/15/22 0905   Alcohol Cap Used Yes 06/15/22 1355   Date of Last Dressing Change 06/15/22 06/15/22 0915   Dressing Type Transparent 06/15/22 1355   Dressing Status Clean;Dry;Intact 06/15/22 1830   Dressing Intervention New 06/15/22 0905        Opportunity for questions and clarification was provided.      Patient transported with:  Patient-specific medications from Pharmacy

## 2022-06-15 NOTE — Progress Notes (Signed)
Ortho Note    Subjective:    Meghan [name] is a 76 y.o. female s/p RIGHT ankle ex-fix and post traumatic fracture blisters    Major Events:.    Skin has healed to the point for surgery. No blisters/erythema/skin breakdown/necrosis    Objective:    Vital signs in last 24 hours:    @VSRANGES @    Temp (24hrs), Avg:98.4 F (36.9 C), Min:98.2 F (36.8 C), Max:98.6 F (37 C)      Labs:    Lab Results   Component Value Date/Time    WBC 7.8 06/12/2022 02:17 AM    HGB 11.4 06/12/2022 02:17 AM    HCT 35.5 06/12/2022 02:17 AM    PLT 990 06/12/2022 02:17 AM       Lab Results   Component Value Date/Time    NA 138 06/12/2022 02:17 AM    K 3.8 06/12/2022 02:17 AM    CL 106 06/12/2022 02:17 AM    CO2 27 06/12/2022 02:17 AM    BUN 19 06/12/2022 02:17 AM    CREATININE 0.81 06/12/2022 02:17 AM    GLUCOSE 72 06/12/2022 02:17 AM       Physical Exam:    General: alert, cooperative, in NAD  Nonlabored resp    RIGHT Lower extremity    Dressing: blister healed, no erythema/skin tenting/ulceration/necrosis  Ex fix pin sites clean      Motor: + toe df/pf    Sensory: grossly intact in foot/ankle    Vascular: Brisk capillary refill in toes    Assessment/Plan:    76 yo F s/p ex-fix RIGHT trimal ankle fx     Pain management: as written     DVT Prophylaxis: hold for OR     To OR today for removal of ex-fix and ORIF RIGHT trimal ankle fx  NPO     Dispo: will need SNF placement. Pt will be NWB RLE for 6 weeks minimum postop    Loraine Maple, MD

## 2022-06-15 NOTE — Progress Notes (Signed)
Ultrasound IV by Jerry Caras. RN :  Procedure Note    Ultrasound IV education provided to patient. Opportunities for questions given.     Ultrasound used for PIV placement:  20 gauge 2.5 in  AccuCath Ace 9.6EX  left basilic location.  1 X Attempt(s).    Flushed with ease; vigorous blood return.     Procedure tolerated well. Primary RN aware of IV placement and added to LDA.      Rene Paci, RN

## 2022-06-15 NOTE — Progress Notes (Signed)
Patient Fall Protocol  Yellow arm band applied to patient and yellow non skid socks placed on  Bed in low position, all side rails up, call bell in reach  Pt and Family instructed in "call- don't fall" protocol   -use your call bell, wait for assistance, staff not family will assist you to get up and move about   Pt and family verbalize understanding of fall precautions and the "call don't fall" Protocol

## 2022-06-15 NOTE — Op Note (Unsigned)
[facility]  OPERATIVE REPORT    Name:  Meghan [name], Meghan [name]  MR#:  [id number]  DOB:  09-10-1945  ACCOUNT #:  [id number]  DATE OF SERVICE:  06/15/2022    PREOPERATIVE DIAGNOSIS:  Closed right trimalleolar ankle fracture, status post external fixation.    POSTOPERATIVE DIAGNOSIS:  Closed right trimalleolar ankle fracture, status post external fixation.    PROCEDURES PERFORMED:  1.  Removal of right ankle external fixator.  2.  Open reduction internal fixation of right trimalleolar ankle fracture to include the posterior malleolus fracture.  3.  Independent interpretation of intraoperative fluoroscopy.    SURGEON:  Sydell Axon. Colene Mines, MD    ASSISTANT:  Dara Hoyer, PA-C    ANESTHESIA:  General with regional.    COMPLICATIONS:  None.    SPECIMENS REMOVED:  None.    IMPLANTS:  Stryker lateral plate and screws, Arthrex medial hook plate and screws, Stryker 4.0 mm cannulated screws x2.    ESTIMATED BLOOD LOSS:  Less than 5 mL.    FINDINGS:  Right trimalleolar ankle fracture with stable syndesmosis.    TOURNIQUET TIME:  Esmarch calf tourniquet for 70 minutes.    INDICATIONS FOR PROCEDURE:  This is a 76 year old female who previously underwent closed reduction external fixation by me approximately 3 weeks ago.  I offered both conservative and surgical management options to the patient.  Her definitive surgery was delayed because of fracture blisters that have now healed.  I discussed the risks of surgery with the patient which include but not limited to complications of anesthesia including death, pain, bleeding, infection, damage to surrounding structures, nonunion, malunion, DVT, PE, wound healing problems, ankle arthritis and the need for further surgery as well as ankle stiffness and ankle pain in the future.  She verbalized understanding and elected to proceed with surgical intervention.    DESCRIPTION OF PROCEDURE:  The correct patient, extremity and operation were identified in the preoperative  holding area and I marked her right ankle.  The Anesthesia team provided a regional block and a pain catheter.  She was taken back to the operating room and placed under general anesthesia on the operating room table.  The patient was positioned in the floppy lateral decubitus position and secured to the table with a safety strap and a beanbag.    The external fixator was removed in its entirety.  The right lower extremity was then prepped and draped in the usual sterile fashion and a preop time-out was called.  Again, the correct patient, extremity and operation were identified, all parties were in agreement and we proceeded with the operation.  The patient received IV antibiotics within 30 minutes of the incision.    The right lower extremity was then exsanguinated with an Esmarch bandage and this was used on the calf as a tourniquet.  The HemaClear tourniquet was not providing adequate hemostasis.    A longitudinal incision was made centered over the medial malleolus and a sharp dissection was carried down to the level of skin, taking care to protect the saphenous vein and nerve.  The comminuted medial malleolus fracture was identified and curetted of intervening hematoma and periosteum and the fracture and the joint were copiously lavaged.    A longitudinal incision was then made on the anterior aspect of the ankle over the distal tibial plafond just lateral to the midline.  A sharp dissection was carried down to the level of skin and blunt dissection carried down to the level of  bone.  A longitudinal incision was then made over the lateral malleolus and sharp dissection was carried down to the level of skin and the superficial peroneal nerve was identified, mobilized and retracted and protected.  Further sharp dissection was carried down to the level of the fracture, which was curetted of intervening hematoma and periosteum and copiously lavaged.  Blunt dissection was carried out on the posterior aspect of the  tibia and down to the level of the posterior malleolus fracture.    Under lateral fluoroscopic guidance, a reduction clamp was placed on the posterior malleolus and the posterior malleolus was reduced in anatomic position.  A K-wire was placed through the anterior incision while retracting the soft tissues, neurovascular bundle, and the extensor tendons to directly visualize the bone and with a tissue protector in place, the K-wire was placed from anterior to posterior catching the posterior malleolus fracture fragment.  The wire was overdrilled and measured and a 4.0-mm cannulated screw was placed over the wire and secured into position and was found to be of appropriate length and there was adequate purchase in the bone.  The clamp was left in place and using the anterior incision, a K-wire was placed in the anterior medial aspect of the ankle from anterior to posterior capturing the posterior malleolus fracture fragment.  The wire was overdrilled and measured and a cannulated screw was placed over the wire and secured into position capturing the posterior malleolus fracture fragment.  The clamp was removed and dorsiflexion and plantarflexion of the ankle with dynamic fluoroscopic images showed no translation or movement of the fracture and was stable.    Attention was then turned to the medial malleolus fracture, which was reduced in anatomic position and pinned into place provisionally.  The Arthrex medial hook plate was placed and impacted in appropriate position and the plate was affixed to the bone with one cortical screw in the oblong hole and two locking screws in the two proximal holes.  Fluoroscopic images showed anatomic reduction of the medial malleolus fracture and satisfactory placement of the plate and screws and appropriate length to the screws.    Attention was then turned to the lateral malleolus fracture, which was then reduced in anatomic position and because of the comminution at the fracture,  I was not able to place a lag screw.  Fluoroscopic images showed anatomic reduction of fracture and restoration of fibular length and a Stryker lateral malleolus locking plate was affixed to the bone with five locking screws in the distal fragment and three bicortical screws in the proximal fragment.  External rotation stress view and Cotton testing showed no abnormal medial clear space or syndesmosis widening.    Final fluoroscopic images of the right ankle were taken, reviewed and independently interpreted by me to show anatomic reduction of the trimalleolar ankle fracture with satisfactory placement of the hardware, restoration of fibular length, congruence of the ankle joint and satisfactory placement of the hardware.    The wounds were copiously irrigated with normal saline and closed in layers.  A sterile dressing was applied and a PICO incisional wound vac was placed over the medial and lateral incisions.  The tourniquet was dropped and a well-padded, well-molded short-leg splint was applied to the right lower extremity and the patient was awakened from anesthesia and taken to the recovery room in hemodynamically stable condition.  All lap and sharp counts were correct at the end of the case.    POSTOPERATIVE CARE:  She will be  admitted back to the hospitalist service.  She will start Lovenox 40 mg subcu daily for DVT prophylaxis on the morning of postop day #1 and she will follow up with Dara Hoyer in the [facility] in two weeks as scheduled.  She can transition from Lovenox to aspirin when she is discharged from the hospital, aspirin 81 mg p.o. b.i.d. for a total of six weeks postop.        Paulino Rily, MD      [id number]  D:  06/15/2022 17:24  T:  06/15/2022 23:10  JOB #:  [id number]

## 2022-06-15 NOTE — Anesthesia Post-Procedure Evaluation (Signed)
Department of Anesthesiology  Postprocedure Note    Patient: Meghan [name]  MRN: [id number]  Birthdate: 14-Jul-1946  Date of evaluation: 06/15/2022      Procedure Summary     Date: 06/15/22 Room / Location: [facility] MA[facility] OR F4 / [facility] MA[facility] OR    Anesthesia Start: 8469 Anesthesia Stop: 6295    Procedure: RIGHT REMOVAL OF EXTERNAL FIXATOR AND ANKLE OPEN REDUCTION INTERNAL FIXATION TRIMALLEOLAR (Right: Ankle) Diagnosis:       Closed trimalleolar fracture of right ankle, initial encounter      (Closed trimalleolar fracture of right ankle, initial encounter [M84.132G])    Surgeons: Beola Cord., MD Responsible Provider: Lenoard Aden, DO    Anesthesia Type: General, Regional ASA Status: 3          Anesthesia Type: General, Regional    Aldrete Phase I: Aldrete Score: 8    Aldrete Phase II:        Anesthesia Post Evaluation    Patient location during evaluation: PACU  Patient participation: complete - patient participated  Level of consciousness: awake  Airway patency: patent  Nausea & Vomiting: no vomiting and no nausea  Complications: no  Cardiovascular status: hemodynamically stable  Respiratory status: acceptable  Hydration status: stable  Pain management: adequate

## 2022-06-15 NOTE — Plan of Care (Signed)
Problem: Skin/Tissue Integrity  Goal: Absence of new skin breakdown  Description: 1.  Monitor for areas of redness and/or skin breakdown  2.  Assess vascular access sites hourly  3.  Every 4-6 hours minimum:  Change oxygen saturation probe site  4.  Every 4-6 hours:  If on nasal continuous positive airway pressure, respiratory therapy assess nares and determine need for appliance change or resting period.  Outcome: Progressing     Problem: Safety - Adult  Goal: Free from fall injury  Outcome: Progressing     Problem: Chronic Conditions and Co-morbidities  Goal: Patient's chronic conditions and co-morbidity symptoms are monitored and maintained or improved  Outcome: Progressing     Problem: Discharge Planning  Goal: Discharge to home or other facility with appropriate resources  Outcome: Progressing     Problem: Neurosensory - Adult  Goal: Achieves stable or improved neurological status  Outcome: Progressing     Problem: Respiratory - Adult  Goal: Achieves optimal ventilation and oxygenation  Outcome: Progressing     Problem: Cardiovascular - Adult  Goal: Maintains optimal cardiac output and hemodynamic stability  Outcome: Progressing     Problem: Skin/Tissue Integrity - Adult  Goal: Skin integrity remains intact  Outcome: Progressing     Problem: Musculoskeletal - Adult  Goal: Return mobility to safest level of function  Outcome: Progressing     Problem: Gastrointestinal - Adult  Goal: Minimal or absence of nausea and vomiting  Outcome: Progressing     Problem: Genitourinary - Adult  Goal: Absence of urinary retention  Outcome: Progressing     Problem: Infection - Adult  Goal: Absence of infection at discharge  Outcome: Progressing     Problem: Metabolic/Fluid and Electrolytes - Adult  Goal: Electrolytes maintained within normal limits  Outcome: Progressing     Problem: Hematologic - Adult  Goal: Maintains hematologic stability  Outcome: Progressing     Problem: Pain  Goal: Verbalizes/displays adequate comfort  level or baseline comfort level  Outcome: Progressing     Problem: Nutrition Deficit:  Goal: Optimize nutritional status  Outcome: Progressing     Problem: ABCDS Injury Assessment  Goal: Absence of physical injury  Outcome: Progressing

## 2022-06-16 MED ORDER — CULTURELLE PO CAPS
Freq: Every day | ORAL | Status: DC
Start: 2022-06-16 — End: 2022-06-16
  Administered 2022-06-16: 12:00:00 1 via ORAL

## 2022-06-16 MED ORDER — ASPIRIN 81 MG PO CHEW
81 MG | ORAL_TABLET | Freq: Two times a day (BID) | ORAL | 3 refills | Status: AC
Start: 2022-06-16 — End: 2022-07-28

## 2022-06-16 NOTE — Progress Notes (Addendum)
Ortho Note    Subjective:    Meghan [name] is a 76 y.o. female who is POD 1   1.  Removal of right ankle external fixator.  2.  Open reduction internal fixation of right trimalleolar ankle fracture to include the posterior malleolus fracture    Major Events:.    NAE overnight    Objective:    Vital signs in last 24 hours:    @VSRANGES @    Temp (24hrs), Avg:98 F (36.7 C), Min:97.3 F (36.3 C), Max:98.4 F (36.9 C)      Labs:    Lab Results   Component Value Date/Time    WBC 7.8 06/12/2022 02:17 AM    HGB 11.4 06/12/2022 02:17 AM    HCT 35.5 06/12/2022 02:17 AM    PLT 990 06/12/2022 02:17 AM       Lab Results   Component Value Date/Time    NA 138 06/12/2022 02:17 AM    K 3.8 06/12/2022 02:17 AM    CL 106 06/12/2022 02:17 AM    CO2 27 06/12/2022 02:17 AM    BUN 19 06/12/2022 02:17 AM    CREATININE 0.81 06/12/2022 02:17 AM    GLUCOSE 72 06/12/2022 02:17 AM       Physical Exam:    General: alert, cooperative, in NAD  Nonlabored resp    RIGHT Lower extremity    Dressing: c/d/I, pico dressings with good seal      Motor: + ankle df/pf    Sensory: SILT    Vascular: Brisk capillary refill in toes    Assessment/Plan:     Pain management: as written     DVT Prophylaxis: Lovenox 30 mg sq BID ok to transition to ASA 81 mg BID x 6 weeks after DC     PT/OT: NWB to RLE     Dispo: pending PT evaluation, medical clearance. Will need DC to SNF    IF DC to SNF this weekend patient will need to come to [place] office Monday 06/19/2022 at [place] for PICO dressing change.     F/u: [facility] next week for PICO dressing change  Greggory Keen, PA-C

## 2022-06-16 NOTE — Progress Notes (Addendum)
**  This patient is NOT under the care of a podiatrist, pt is being managed by an Human resources officer.  This patient is NOT to follow up with a podiatrist.**    Hospital follow up appointment with podiatrist made for November 6th at 9:15 am.    Dolan Amen  Case Management Assistant

## 2022-06-16 NOTE — Progress Notes (Signed)
Discharge instructions/AVS given to hospital to home transporters taking pt to [facility]. Report called to Rasheka, RN prior to discharge.IV's removed and pt bathed before leaving hospital. Hospital to home staff denies any questions about instructions. Pt discharged per MD order, being taken by hospital to home via ambulance. Pt was transferred to ambulance via stretcher, assisted by staff. Pt in no apparent distress at time of discharge with no complaints.

## 2022-06-16 NOTE — Care Coordination-Inpatient (Signed)
Transition of Care Plan to SNF/Rehab    Communication to Patient/Family:  Met with patient and family and they are agreeable to the transition plan. The Plan for Transition of Care is related to the following treatment goals: SNF [facility]     The Patient and/or patient representative was provided with a choice of provider and agrees  with the discharge plan.      Yes _0  No _1     A Freedom of choice list was provided with basic dialogue that supports the patient's individualized plan of care/goals and shares the quality data associated with the providers.       Yes _2  No _3     SNF/Rehab Transition:  Patient has been accepted to [facility] SNF/Rehab and meets criteria for admission.   Patient will transported by Hospital to Home and expected to leave at [name] to SNF/Rehab:  Bedside RN, has been notified to update the transition plan to the facility and call report [phone]  Room 45A  Discharge information has been updated on the AVS. And communicated to facility via [employer], or CC link.      Nursing Please include all hard scripts for controlled substances, med rec and dc summary, and AVS in packet.     Reviewed and confirmed with facility, [facility] can manage the patient care needs for the following:     [place] with (X) only those applicable:  Medication:  _4 Medications are available at the facility  _5 IV Antibiotics    _6 Controlled Substance - Hydrocodone PRN  _7 Weekly Labs    Equipment:  _8 CPAP/BiPAP  _9 Wound Vacuum  _10 Foley or Urinary Device  _11 PICC/Central Line  _12 Nebulizer  _13 Ventilator    Treatment:  _14 Isolation (for MRSA, VRE, etc.)  _15 Surgical Drain Management  _16 Tracheostomy Care  _17 Dressing Changes Pt has a follow-up with [facility] on 06/19/22 @ 8am for dressing changes.   _18 Dialysis with transportation  _19 PEG Care  _20 Oxygen  _21 Daily Weights for Heart Failure    Dietary:  _22 Any diet limitations  _23 Tube Feedings   _24 Total  Parenteral Management (TPN)    Financial Resources:  <AVQSPOWSVXXIOYBB>_2<\/BTGIEODDYHMGHWFA>_13 Medicaid Application Completed    _26 UAI Completed and copy given to pt/family  and copy given to pt/family  _27 A screening has previously been completed.    _28 Level II Completed    _29  Private pay individual who will not become   financially eligible for Medicaid within 6 months from admission to a [facility] facility.     _30  Individual refused to have screening conducted.     <ILXFVGQXZYSUWUGW>_0<\/NTHSIMXGVMPSSXWD>_00 Medicaid Application Completed    _32 The screening denied because it was determined individual did not need/did not qualify for nursing facility level of care.  _33  Out of state residents seeking direct admission to a [facility] facility.  _34  Individuals who are inpatients of an out of state hospital, or in state or out of state veterans/Military hospital and seek direct admission to a [facility] facility  _35  Individuals who are pateints or residents of a state owned/operated facility that is licensed by Department of [employer] ([employer]) and seek direct admission to [facility] facility  _36  A screening not required for enrollment in [facility] services as set out in 12 VAC [id number]  _37  [facility]) staff shall perform screenings of the [facility] clients.    Advanced Care Plan:  _38 Surrogate Decision Maker of Care  _39 POA  _40 Communicated Code Status and copy sent.  Other:           Per follow-up from [facility], facility is unable to accommodate Pt at this time and recommends that Pt complete rehab at local SNF prior to transitioning to [place].  CM spoke with Pt's Daughter in law. Pt's daughter in law was receptive and agreed for Pt to discharge to previously planned facility, [facility]       [facility] has accepted and Hospital to Home transport has been arranged for 5pm today.       CM arranged Hospital to Home Transport for [facility] follow-up appointment on Monday 06/19/22 @ 8am    Transport pickup time is  7:15am       Florene [place], [place], [place]  [phone]

## 2022-06-16 NOTE — Discharge Summary (Signed)
Hospitalist Discharge Summary     Patient ID:  Meghan [name]  [id number]  76 y.o.  09-05-1945    Admit date: 05/23/2022    Discharge date and time: 06/16/2022    Admission Diagnoses: Encephalopathy [G93.40]  Acute encephalopathy [G93.40]  Closed head injury, initial encounter [S09.90XA]  Unwitnessed fall [R29.6]    Discharge Diagnoses:    Principal Problem:    Encephalopathy  Active Problems:    AKI (acute kidney injury) ([place])    Hypoglycemia    Stroke (cerebrum) ([place])    Complex partial seizure evolving to generalized seizure ([place])    Dementia without behavioral disturbance ([place])    HLD (hyperlipidemia)    Type 2 diabetes mellitus with diabetic neuropathy (HCC)    Closed fracture dislocation of ankle joint, right, initial encounter  Resolved Problems:    * No resolved hospital problems. *         Hospital Course:      76 yo hx of HTN, Parkinson's dementia, seizure d/o, presented w/ AMS, hypoglycemia, hypotension.  Hospital course complicated by fall, R ankle fracture s/p external fixation 10/16, ORIF 11/2     #R ankle fracture/pain: due to GLF.  S/p external fixation by Dr. Dema Severin on 10/16.  S/p bedside debridement of blister on 10/20. S/p ORIF 11/2. Discharges to rehab and is NWB. BID ASA x 6 weeks. F/u Dr. Dema Severin.      #Acute met encephalopathy: now stable.  Likely from hypoglycemia, hypotension.        #Hypotension/hypoglycemia: now resolved.    ACTH stim test neg for adrenal insuff.       #AKI: resolved with IVF     #HTN: BP stable.  Cont lisinopril.  Use IV hydralazine prn     # DM type 2 w/ diabetic neuropathy: A1C 5.6%.  Off insulin due to hypoglycemia.  Diet control         #Parkinson's dementia/depression seems to be at baseline.  Cont Namenda, Aricept, celexa      #Seizure d/o: Cont Vimpat, depakote    Imaging  No results found.     PCP: Gwenith Spitz, MD     Consults: orthopedic surgery    Condition of patient at discharge: Improved and Stable    Discharge Exam:    Physical  Exam:    Gen: Well-developed, well-nourished, in no acute distress  HEENT:  Pink conjunctivae, PERRL, hearing intact to voice, moist mucous membranes  Neck: Supple, without masses, thyroid non-tender  Resp: No accessory muscle use, clear breath sounds without wheezes rales or rhonchi  Card: No murmurs, normal S1, S2 without thrills, bruits or peripheral edema  Abd:  Soft, non-tender, non-distended, normoactive bowel sounds are present, no palpable organomegaly and no detectable hernias  Lymph:  No cervical or inguinal adenopathy  Musc: No cyanosis or clubbing  Skin: No rashes or ulcers, skin turgor is good  Neuro:  Cranial nerves are grossly intact, no focal motor weakness, follows commands appropriately  Psych:  Good insight, oriented to person, place and time, alert          Disposition: home    Patient Instructions:   Current Discharge Medication List        START taking these medications    Details   HYDROcodone-acetaminophen (NORCO) 5-325 MG per tablet Take 1 tablet by mouth every 4 hours as needed for Pain for up to 5 days. Max Daily Amount: 6 tablets  Qty: 15 tablet, Refills: 0    Comments: Reduce  doses taken as pain becomes manageable  Associated Diagnoses: Closed fracture dislocation of ankle joint, right, initial encounter      enoxaparin Sodium (LOVENOX) 30 MG/0.3ML injection Inject 0.3 mLs into the skin 2 times daily  Qty: 18 mL, Refills: 0      sennosides-docusate sodium (SENOKOT-S) 8.6-50 MG tablet Take 1 tablet by mouth daily  Qty: 30 tablet, Refills: 0           CONTINUE these medications which have CHANGED    Details   aspirin 81 MG chewable tablet Take 1 tablet by mouth in the morning and at bedtime  Qty: 30 tablet, Refills: 3      divalproex (DEPAKOTE) 500 MG DR tablet Take 1 tablet by mouth every 12 hours  Qty: 60 tablet, Refills: 0           CONTINUE these medications which have NOT CHANGED    Details   lisinopril (PRINIVIL;ZESTRIL) 10 MG tablet Take 1 tablet by mouth daily      calcium  carbonate 600 MG TABS tablet Take 1 tablet by mouth daily      solifenacin (VESICARE) 5 MG tablet Take 1 tablet by mouth daily      lacosamide (VIMPAT) 100 MG TABS tablet Take 1 tablet by mouth 2 times daily for 5 days. Max Daily Amount: 200 mg  Qty: 10 tablet, Refills: 0    Associated Diagnoses: Seizure (HCC)      vitamin D3 (CHOLECALCIFEROL) 125 MCG (5000 UT) TABS tablet Take 1 tablet by mouth daily      citalopram (CELEXA) 20 MG tablet Take 0.5 tablets by mouth daily      donepezil (ARICEPT) 10 MG tablet TAKE 1 TABLET BY MOUTH EVERY NIGHT      memantine (NAMENDA) 10 MG tablet TAKE 1 TABLET BY MOUTH TWICE DAILY      rOPINIRole (REQUIP) 1 MG tablet Take 1 tablet by mouth nightly           STOP taking these medications       fenofibrate (TRICOR) 54 MG tablet Comments:   Reason for Stopping:         fluticasone (FLONASE) 50 MCG/ACT nasal spray Comments:   Reason for Stopping:         folic acid (FOLVITE) 1 MG tablet Comments:   Reason for Stopping:         glucose-vitamin C 4-6 GM-MG CHEW chewable tablet Comments:   Reason for Stopping:         rasagiline (AZILECT) 0.5 MG TABS Comments:   Reason for Stopping:             Activity: NWB RLE  Diet: regular diet  Wound Care: keep wound clean and dry    [place] office Monday 06/19/2022 at 8AM for PICO dressing change.     Follow-up with Gwenith Spitz, MD in 1 week.  Follow-up tests/labs     Approximate time spent in patient care on day of discharge: 35 min    Signed:  Lyn Hollingshead, MD  06/16/2022  1:15 PM

## 2022-06-17 MED ORDER — LACOSAMIDE 100 MG PO TABS
100 MG | ORAL_TABLET | Freq: Two times a day (BID) | ORAL | 0 refills | Status: AC
Start: 2022-06-17 — End: 2022-06-22

## 2022-06-17 MED ORDER — LACOSAMIDE 100 MG PO TABS
100 MG | ORAL_TABLET | Freq: Two times a day (BID) | ORAL | 0 refills | Status: DC
Start: 2022-06-17 — End: 2022-06-17

## 2022-06-21 DIAGNOSIS — Z23 Encounter for immunization: Secondary | ICD-10-CM | POA: Diagnosis not present

## 2022-07-14 DIAGNOSIS — H524 Presbyopia: Secondary | ICD-10-CM | POA: Diagnosis not present

## 2022-07-31 DIAGNOSIS — H524 Presbyopia: Secondary | ICD-10-CM | POA: Diagnosis not present

## 2022-09-06 DIAGNOSIS — E1122 Type 2 diabetes mellitus with diabetic chronic kidney disease: Secondary | ICD-10-CM | POA: Diagnosis not present

## 2022-09-06 DIAGNOSIS — J309 Allergic rhinitis, unspecified: Secondary | ICD-10-CM | POA: Diagnosis not present

## 2022-09-06 DIAGNOSIS — M109 Gout, unspecified: Secondary | ICD-10-CM | POA: Diagnosis not present

## 2022-09-06 DIAGNOSIS — I129 Hypertensive chronic kidney disease with stage 1 through stage 4 chronic kidney disease, or unspecified chronic kidney disease: Secondary | ICD-10-CM | POA: Diagnosis not present

## 2022-09-06 DIAGNOSIS — N1831 Chronic kidney disease, stage 3a: Secondary | ICD-10-CM | POA: Diagnosis not present

## 2022-09-06 DIAGNOSIS — E785 Hyperlipidemia, unspecified: Secondary | ICD-10-CM | POA: Diagnosis not present

## 2022-09-06 DIAGNOSIS — K219 Gastro-esophageal reflux disease without esophagitis: Secondary | ICD-10-CM | POA: Diagnosis not present

## 2022-09-06 DIAGNOSIS — G4733 Obstructive sleep apnea (adult) (pediatric): Secondary | ICD-10-CM | POA: Diagnosis not present

## 2023-01-15 ENCOUNTER — Other Ambulatory Visit (HOSPITAL_COMMUNITY): Payer: Self-pay | Admitting: Internal Medicine

## 2023-01-15 DIAGNOSIS — Z1231 Encounter for screening mammogram for malignant neoplasm of breast: Secondary | ICD-10-CM

## 2023-01-17 ENCOUNTER — Encounter (HOSPITAL_COMMUNITY): Payer: Self-pay

## 2023-01-17 ENCOUNTER — Ambulatory Visit (HOSPITAL_COMMUNITY)
Admission: RE | Admit: 2023-01-17 | Discharge: 2023-01-17 | Disposition: A | Discharge status: Home / Self Care | Payer: Medicare HMO | Source: Ambulatory Visit | Attending: Internal Medicine | Admitting: Internal Medicine

## 2023-01-17 DIAGNOSIS — Z1231 Encounter for screening mammogram for malignant neoplasm of breast: Secondary | ICD-10-CM | POA: Diagnosis not present

## 2023-01-25 DIAGNOSIS — H40053 Ocular hypertension, bilateral: Secondary | ICD-10-CM | POA: Diagnosis not present

## 2023-01-31 DIAGNOSIS — M109 Gout, unspecified: Secondary | ICD-10-CM | POA: Diagnosis not present

## 2023-01-31 DIAGNOSIS — R7989 Other specified abnormal findings of blood chemistry: Secondary | ICD-10-CM | POA: Diagnosis not present

## 2023-01-31 DIAGNOSIS — E785 Hyperlipidemia, unspecified: Secondary | ICD-10-CM | POA: Diagnosis not present

## 2023-01-31 DIAGNOSIS — K219 Gastro-esophageal reflux disease without esophagitis: Secondary | ICD-10-CM | POA: Diagnosis not present

## 2023-01-31 DIAGNOSIS — E1122 Type 2 diabetes mellitus with diabetic chronic kidney disease: Secondary | ICD-10-CM | POA: Diagnosis not present

## 2023-01-31 DIAGNOSIS — Z1212 Encounter for screening for malignant neoplasm of rectum: Secondary | ICD-10-CM | POA: Diagnosis not present

## 2023-02-07 DIAGNOSIS — Z1339 Encounter for screening examination for other mental health and behavioral disorders: Secondary | ICD-10-CM | POA: Diagnosis not present

## 2023-02-07 DIAGNOSIS — J309 Allergic rhinitis, unspecified: Secondary | ICD-10-CM | POA: Diagnosis not present

## 2023-02-07 DIAGNOSIS — E1122 Type 2 diabetes mellitus with diabetic chronic kidney disease: Secondary | ICD-10-CM | POA: Diagnosis not present

## 2023-02-07 DIAGNOSIS — Z0000 Encounter for general adult medical examination without abnormal findings: Secondary | ICD-10-CM | POA: Diagnosis not present

## 2023-02-07 DIAGNOSIS — R82998 Other abnormal findings in urine: Secondary | ICD-10-CM | POA: Diagnosis not present

## 2023-02-07 DIAGNOSIS — I129 Hypertensive chronic kidney disease with stage 1 through stage 4 chronic kidney disease, or unspecified chronic kidney disease: Secondary | ICD-10-CM | POA: Diagnosis not present

## 2023-02-07 DIAGNOSIS — E785 Hyperlipidemia, unspecified: Secondary | ICD-10-CM | POA: Diagnosis not present

## 2023-02-07 DIAGNOSIS — Z1331 Encounter for screening for depression: Secondary | ICD-10-CM | POA: Diagnosis not present

## 2023-02-07 DIAGNOSIS — N1831 Chronic kidney disease, stage 3a: Secondary | ICD-10-CM | POA: Diagnosis not present

## 2023-02-07 DIAGNOSIS — I493 Ventricular premature depolarization: Secondary | ICD-10-CM | POA: Diagnosis not present

## 2023-02-14 DIAGNOSIS — H40053 Ocular hypertension, bilateral: Secondary | ICD-10-CM | POA: Diagnosis not present

## 2023-05-26 DIAGNOSIS — Z23 Encounter for immunization: Secondary | ICD-10-CM | POA: Diagnosis not present

## 2023-06-11 DIAGNOSIS — H40013 Open angle with borderline findings, low risk, bilateral: Secondary | ICD-10-CM | POA: Diagnosis not present

## 2023-06-13 NOTE — Progress Notes (Unsigned)
PATIENT: Angelica Mcconnell DOB: 06-08-1946  REASON FOR VISIT: follow up HISTORY FROM: patient  No chief complaint on file.    HISTORY OF PRESENT ILLNESS:  06/13/23 ALL:  Phung returns for follow up for OSA on CPAP.     03/06/2022 ALL:  LEMMIE GARNET is a 77 y.o. female here today for follow up for OSA on CPAP. She reports doing well on therapy. Her machine was set up in 2015. She has not been able to get new supplies as she is no longer in network with [employer]. We do not have data to review, today. She reports using CPAP nearly every night. She denies concerns with machine and feels it helps her rest.   HISTORY: (copied from Dr Teofilo Pod previous note)  Ms. [name] is a 77 year old right-handed woman with an underlying medical history of obesity, hypertension, prior smoking, reflux disease, who presents for re-evaluation of her obstructive sleep apnea after a long gap. The patient is unaccompanied today and presents to reestablish care. She was last seen in this clinic nearly 3  years ago, on 06/05/17, at which time she was compliant with her CPAP and endorsed ongoing good results.    Today, 11/16/2020: I reviewed her CPAP compliance data from 10/13/2020 through 11/11/2020, which is a total of 30 days, during which time she used her machine 22 days with percent use days greater than 4 hours at 70%, indicating adequate compliance with an average usage of 8 hours/days on treatment, residual AHI at goal at 0.5/h, leak on the low side with a 95th percentile at 0.2 L/min on a pressure of 6 cm with EPR of 3.  She reports doing well with her CPAP.  She tried to skip every other night to see if she can go without treatment but overall feels better with treatment and has been using her machine more consistently.  She does need new supplies, DME company is [employer].  She was informed that she will need a new prescription.  She goes to bed generally between 11 PM or  midnight, she lives alone, rise time varies, she will listen to her pastor between 6 and 8 on the phone and after prior she will often stay in bed and may doze off until 9 AM typically.  She had 1 new prescription for her blood pressure, she has a physical coming up later this month with blood work a week prior.  She has had some allergy issues, uses over-the-counter nasal spray and allergy medicine.  She does not drink caffeine on a day-to-day basis owing to reflux issues and also blood pressure.   REVIEW OF SYSTEMS: Out of a complete 14 system review of symptoms, the patient complains only of the following symptoms, none and all other reviewed systems are negative.  ESS: 0/24  ALLERGIES: No Known Allergies  HOME MEDICATIONS: Outpatient Medications Prior to Visit  Medication Sig Dispense Refill   allopurinol (ZYLOPRIM) 300 MG tablet Take 300 mg by mouth daily.     amLODipine (NORVASC) 5 MG tablet Take 5 mg by mouth daily.     aspirin EC 81 MG tablet Take 81 mg by mouth daily.     Calcium Carbonate-Vitamin D (CALTRATE 600+D PO) Take 1 tablet by mouth daily.      fluticasone (FLONASE) 50 MCG/ACT nasal spray Place 1-2 sprays into both nostrils as needed.      ipratropium (ATROVENT) 0.06 % nasal spray SMARTSIG:2 Spray(s) Both Nares 3 Times Daily PRN  irbesartan (AVAPRO) 300 MG tablet Take 1 tablet by mouth daily.     metoprolol tartrate (LOPRESSOR) 25 MG tablet Take 1 tablet (25 mg total) by mouth 2 (two) times daily. 60 tablet 2   Multiple Vitamin (MULTIVITAMIN) capsule Take 1 capsule by mouth daily.     omeprazole (PRILOSEC) 20 MG capsule Take 20 mg by mouth daily as needed. Takes only as needed     promethazine-dextromethorphan (PROMETHAZINE-DM) 6.25-15 MG/5ML syrup Take 5 mLs by mouth 4 (four) times daily as needed. 100 mL 0   Vitamin D, Cholecalciferol, 25 MCG (1000 UT) TABS Take by mouth.     No facility-administered medications prior to visit.    PAST MEDICAL HISTORY: Past  Medical History:  Diagnosis Date   Gout    HTN (hypertension)    OSA (obstructive sleep apnea) 07/15/2013   Reflux    Sinus infection     PAST SURGICAL HISTORY: Past Surgical History:  Procedure Laterality Date   COLONOSCOPY     COLONOSCOPY [name] 08/31/2014   Procedure: COLONOSCOPY;  Surgeon: Corbin Ade, MD;  Location: [facility];  Service: Endoscopy;  Laterality: [name];  8:30 AM   Fybroid emoval      FAMILY HISTORY: Family History  Problem Relation Age of Onset   Diabetes Mother    Thyroid disease Mother    Heart failure Mother 60       Medication damage    SOCIAL HISTORY: Social History   Socioeconomic History   Marital status: Single    Spouse name: Not on file   Number of children: 1   Years of education: Not on file   Highest education level: Not on file  Occupational History   Not on file  Tobacco Use   Smoking status: Former    Current packs/day: 0.00    Types: Cigarettes    Quit date: 07/15/1989    Years since quitting: 33.9    Passive exposure: Never   Smokeless tobacco: Never  Vaping Use   Vaping status: Never Used  Substance and Sexual Activity   Alcohol use: No   Drug use: No   Sexual activity: Not Currently    Birth control/protection: None  Other Topics Concern   Not on file  Social History Narrative   Right handed, Caffeine 1-2 monthly, Single, 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  Retired.     Social Determinants of Health   Financial Resource Strain: Not on file  Food Insecurity: Not on file  Transportation Needs: Not on file  Physical Activity: Not on file  Stress: Not on file  Social Connections: Not on file  Intimate Partner Violence: Not on file     PHYSICAL EXAM  There were no vitals filed for this visit.  There is no height or weight on file to calculate BMI.  Generalized: Well developed, in no acute distress  Cardiology: normal rate and rhythm, no murmur noted Respiratory: clear to auscultation bilaterally  Neurological examination   Mentation: Alert oriented to time, place, history taking. Follows all commands speech and language fluent Cranial nerve II-XII: Pupils were equal round reactive to light. Extraocular movements were full, visual field were full  Motor: The motor testing reveals 5 over 5 strength of all 4 extremities. Good symmetric motor tone is noted throughout.  Gait and station: Gait is normal.    DIAGNOSTIC DATA (LABS, IMAGING, TESTING) - I reviewed patient records, labs, notes, testing and imaging myself where available.      No data  to display           No results found for: "WBC", "HGB", "HCT", "MCV", "PLT" No results found for: "NA", "K", "CL", "CO2", "GLUCOSE", "BUN", "CREATININE", "CALCIUM", "PROT", "ALBUMIN", "AST", "ALT", "ALKPHOS", "BILITOT", "GFRNONAA", "GFRAA" No results found for: "CHOL", "HDL", "LDLCALC", "LDLDIRECT", "TRIG", "CHOLHDL" No results found for: "HGBA1C" No results found for: "VITAMINB12" No results found for: "TSH"   ASSESSMENT AND PLAN 77 y.o. year old female  has a past medical history of Gout, HTN (hypertension), OSA (obstructive sleep apnea) (07/15/2013), Reflux, and Sinus infection. here with   No diagnosis found.     NICHA ZORA is doing well on CPAP therapy. We do not have compliance report for review, today. We have reached out to DME and will addend notes once complaince data is received. She was encouraged to continue using CPAP nightly and for greater than 4 hours each night. We will repeat HST then order a new CPAP to DME in network. Risks of untreated sleep apnea review and education materials provided. Healthy lifestyle habits encouraged. She will follow up 31-90 days following set up of new CPAP. She verbalizes understanding and agreement with this plan.    No orders of the defined types were placed in this encounter.    No orders of the defined types were placed in this encounter.     Shawnie Dapper, FNP-C 06/13/2023, 1:05 PM [facility] [address] [place], [place] [address] [phone]

## 2023-06-13 NOTE — Patient Instructions (Incomplete)
Please continue using your CPAP regularly. While your insurance requires that you use CPAP at least 4 hours each night on 70% of the nights, I recommend, that you not skip any nights and use it throughout the night if you can. Getting used to CPAP and staying with the treatment long term does take time and patience and discipline. Untreated obstructive sleep apnea when it is moderate to severe can have an adverse impact on cardiovascular health and raise her risk for heart disease, arrhythmias, hypertension, congestive heart failure, stroke and diabetes. Untreated obstructive sleep apnea causes sleep disruption, nonrestorative sleep, and sleep deprivation. This can have an impact on your day to day functioning and cause daytime sleepiness and impairment of cognitive function, memory loss, mood disturbance, and problems focussing. Using CPAP regularly can improve these symptoms.  We will update supply orders, today. Reach out to the DME to get replacement supplies if you do not hear from them.   Keep an eye on your BP.   Follow up in 1 year

## 2023-06-14 ENCOUNTER — Ambulatory Visit: Payer: Medicare HMO | Admitting: Family Medicine

## 2023-06-14 ENCOUNTER — Encounter: Payer: Self-pay | Admitting: Family Medicine

## 2023-06-14 VITALS — BP 148/90 | HR 80 | Ht 64.0 in | Wt 249.2 lb

## 2023-06-14 DIAGNOSIS — G4733 Obstructive sleep apnea (adult) (pediatric): Secondary | ICD-10-CM

## 2023-06-22 DIAGNOSIS — I129 Hypertensive chronic kidney disease with stage 1 through stage 4 chronic kidney disease, or unspecified chronic kidney disease: Secondary | ICD-10-CM | POA: Diagnosis not present

## 2023-06-22 DIAGNOSIS — Z23 Encounter for immunization: Secondary | ICD-10-CM | POA: Diagnosis not present

## 2023-06-22 DIAGNOSIS — E1122 Type 2 diabetes mellitus with diabetic chronic kidney disease: Secondary | ICD-10-CM | POA: Diagnosis not present

## 2023-06-22 DIAGNOSIS — E785 Hyperlipidemia, unspecified: Secondary | ICD-10-CM | POA: Diagnosis not present

## 2023-06-22 DIAGNOSIS — M8580 Other specified disorders of bone density and structure, unspecified site: Secondary | ICD-10-CM | POA: Diagnosis not present

## 2023-06-22 DIAGNOSIS — N1831 Chronic kidney disease, stage 3a: Secondary | ICD-10-CM | POA: Diagnosis not present

## 2023-06-22 DIAGNOSIS — J309 Allergic rhinitis, unspecified: Secondary | ICD-10-CM | POA: Diagnosis not present

## 2023-07-09 DIAGNOSIS — H35033 Hypertensive retinopathy, bilateral: Secondary | ICD-10-CM | POA: Diagnosis not present

## 2023-07-09 DIAGNOSIS — H524 Presbyopia: Secondary | ICD-10-CM | POA: Diagnosis not present

## 2023-07-27 DIAGNOSIS — H524 Presbyopia: Secondary | ICD-10-CM | POA: Diagnosis not present

## 2023-10-31 DIAGNOSIS — I129 Hypertensive chronic kidney disease with stage 1 through stage 4 chronic kidney disease, or unspecified chronic kidney disease: Secondary | ICD-10-CM | POA: Diagnosis not present

## 2023-10-31 DIAGNOSIS — G4733 Obstructive sleep apnea (adult) (pediatric): Secondary | ICD-10-CM | POA: Diagnosis not present

## 2023-10-31 DIAGNOSIS — E1122 Type 2 diabetes mellitus with diabetic chronic kidney disease: Secondary | ICD-10-CM | POA: Diagnosis not present

## 2023-10-31 DIAGNOSIS — N1831 Chronic kidney disease, stage 3a: Secondary | ICD-10-CM | POA: Diagnosis not present

## 2023-10-31 DIAGNOSIS — K219 Gastro-esophageal reflux disease without esophagitis: Secondary | ICD-10-CM | POA: Diagnosis not present

## 2023-10-31 DIAGNOSIS — E785 Hyperlipidemia, unspecified: Secondary | ICD-10-CM | POA: Diagnosis not present

## 2023-10-31 DIAGNOSIS — M154 Erosive (osteo)arthritis: Secondary | ICD-10-CM | POA: Diagnosis not present

## 2024-01-08 IMAGING — MG MM DIGITAL SCREENING BILAT W/ TOMO AND CAD
6 of 12 series · 6 of 36 positions shown · non-contrast
Comparison: Previous exam(s).

CLINICAL DATA: Screening.

EXAM:
DIGITAL SCREENING BILATERAL MAMMOGRAM WITH TOMOSYNTHESIS AND CAD
TECHNIQUE: Bilateral screening digital craniocaudal and mediolateral oblique
mammograms were obtained. Bilateral screening digital breast
tomosynthesis was performed. The images were evaluated with
computer-aided detection.

[R MLO synth-2D (1 of 2)]
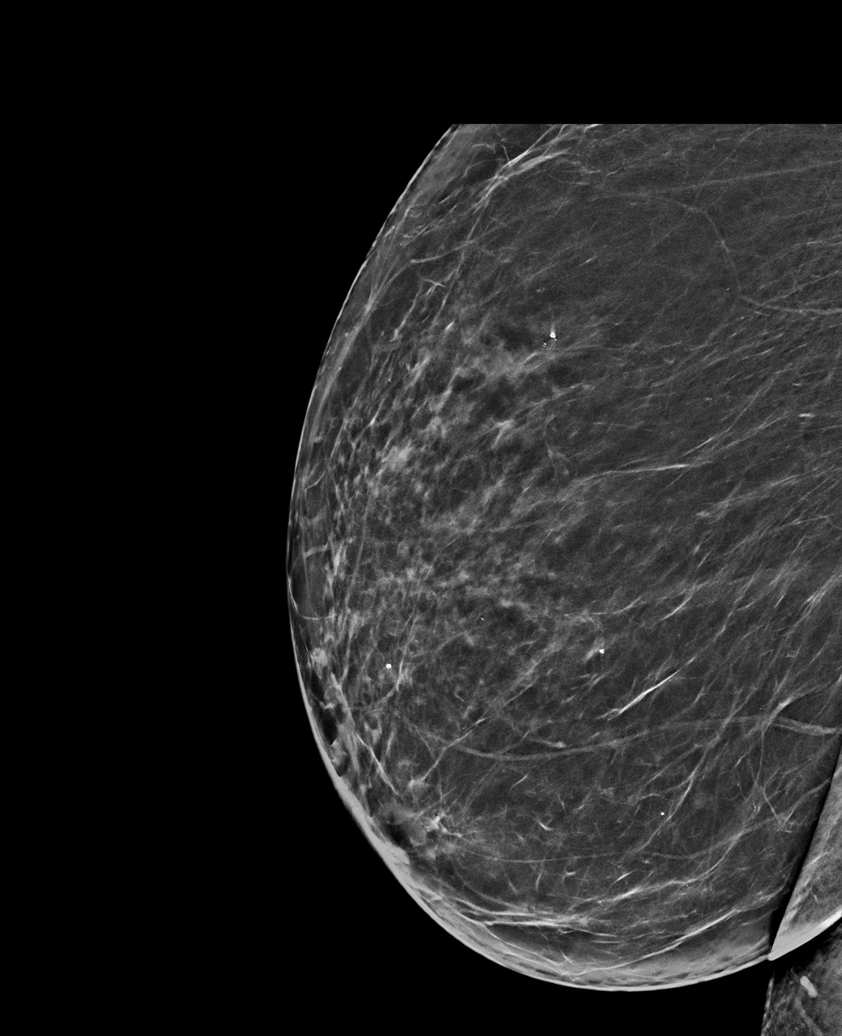

[R MLO synth-2D (2 of 2)]
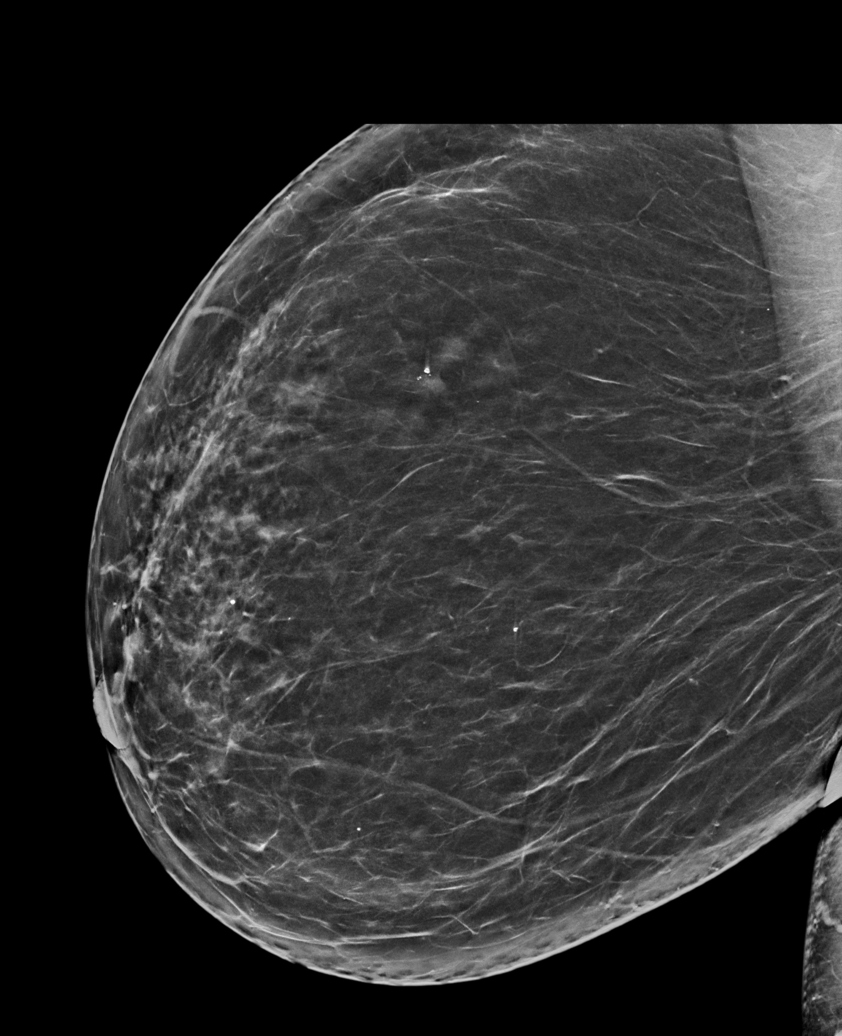

[R CC synth-2D (1 of 2)]
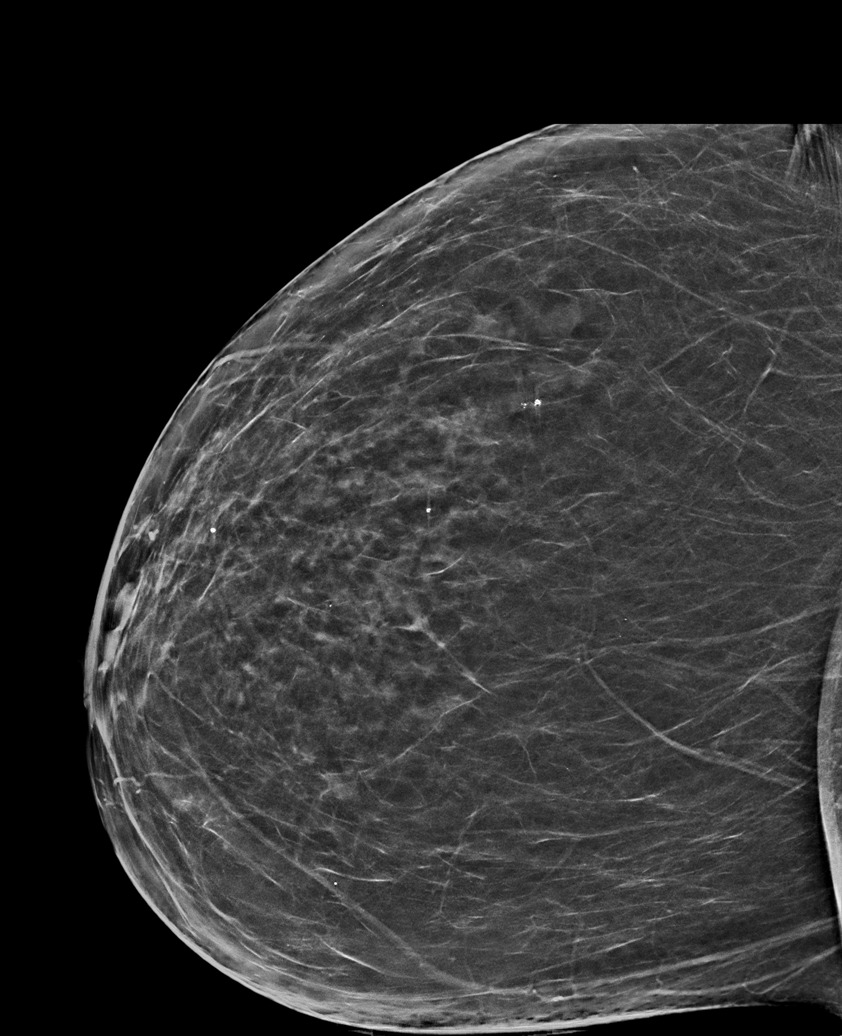

[L MLO synth-2D]
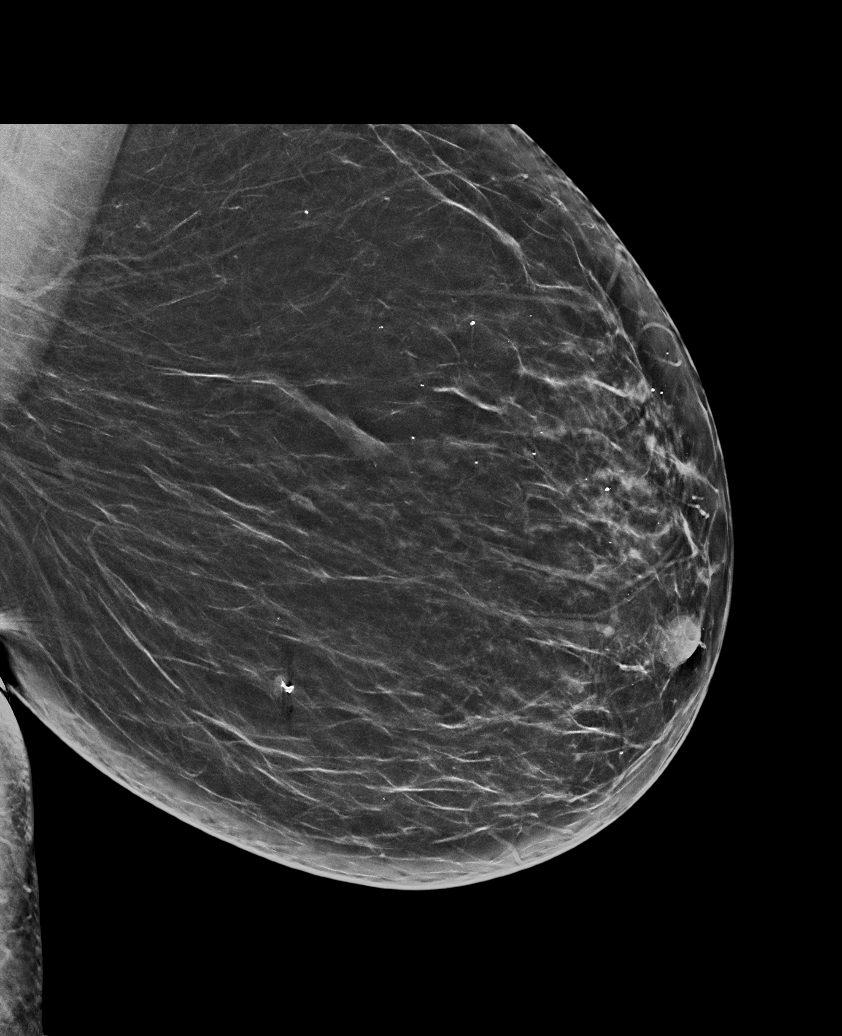

[L CC synth-2D]
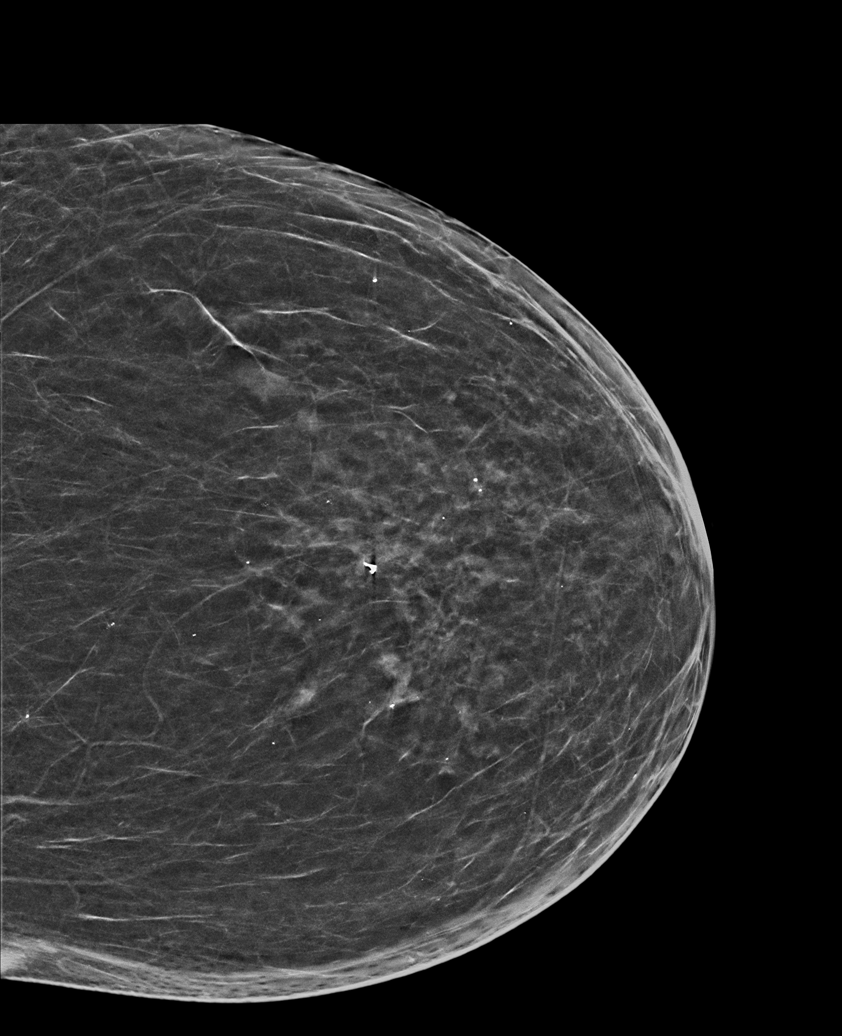

[R CC synth-2D (2 of 2)]
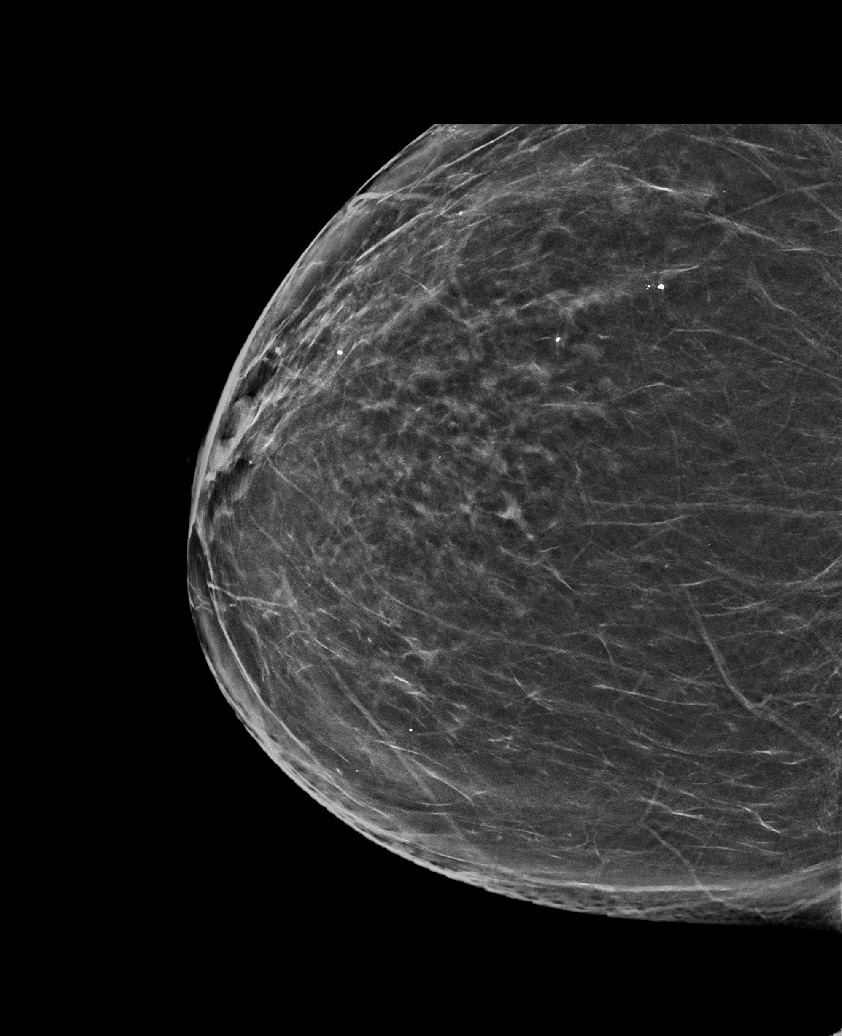

[6 of 36 positions shown; findings below may reference images not displayed]

ACR Breast Density Category b: There are scattered areas of
fibroglandular density.
FINDINGS: There are no findings suspicious for malignancy.
IMPRESSION: No mammographic evidence of malignancy. A result letter of this
screening mammogram will be mailed directly to the patient.

RECOMMENDATION:
Screening mammogram in one year. (Code:[id number])

BI-RADS CATEGORY  1: Negative.

## 2024-01-29 DIAGNOSIS — H40011 Open angle with borderline findings, low risk, right eye: Secondary | ICD-10-CM | POA: Diagnosis not present

## 2024-02-22 DIAGNOSIS — M109 Gout, unspecified: Secondary | ICD-10-CM | POA: Diagnosis not present

## 2024-02-22 DIAGNOSIS — E1122 Type 2 diabetes mellitus with diabetic chronic kidney disease: Secondary | ICD-10-CM | POA: Diagnosis not present

## 2024-02-22 DIAGNOSIS — Z1212 Encounter for screening for malignant neoplasm of rectum: Secondary | ICD-10-CM | POA: Diagnosis not present

## 2024-02-22 DIAGNOSIS — E785 Hyperlipidemia, unspecified: Secondary | ICD-10-CM | POA: Diagnosis not present

## 2024-02-22 DIAGNOSIS — E7849 Other hyperlipidemia: Secondary | ICD-10-CM | POA: Diagnosis not present

## 2024-02-22 DIAGNOSIS — I129 Hypertensive chronic kidney disease with stage 1 through stage 4 chronic kidney disease, or unspecified chronic kidney disease: Secondary | ICD-10-CM | POA: Diagnosis not present

## 2024-02-22 DIAGNOSIS — N1831 Chronic kidney disease, stage 3a: Secondary | ICD-10-CM | POA: Diagnosis not present

## 2024-02-22 DIAGNOSIS — K219 Gastro-esophageal reflux disease without esophagitis: Secondary | ICD-10-CM | POA: Diagnosis not present

## 2024-02-28 ENCOUNTER — Other Ambulatory Visit (HOSPITAL_COMMUNITY): Payer: Self-pay | Admitting: Internal Medicine

## 2024-02-28 DIAGNOSIS — Z1231 Encounter for screening mammogram for malignant neoplasm of breast: Secondary | ICD-10-CM

## 2024-02-29 DIAGNOSIS — Z1339 Encounter for screening examination for other mental health and behavioral disorders: Secondary | ICD-10-CM | POA: Diagnosis not present

## 2024-02-29 DIAGNOSIS — Z0000 Encounter for general adult medical examination without abnormal findings: Secondary | ICD-10-CM | POA: Diagnosis not present

## 2024-02-29 DIAGNOSIS — I493 Ventricular premature depolarization: Secondary | ICD-10-CM | POA: Diagnosis not present

## 2024-02-29 DIAGNOSIS — R82998 Other abnormal findings in urine: Secondary | ICD-10-CM | POA: Diagnosis not present

## 2024-02-29 DIAGNOSIS — E1122 Type 2 diabetes mellitus with diabetic chronic kidney disease: Secondary | ICD-10-CM | POA: Diagnosis not present

## 2024-02-29 DIAGNOSIS — Z6841 Body Mass Index (BMI) 40.0 and over, adult: Secondary | ICD-10-CM | POA: Diagnosis not present

## 2024-02-29 DIAGNOSIS — J309 Allergic rhinitis, unspecified: Secondary | ICD-10-CM | POA: Diagnosis not present

## 2024-02-29 DIAGNOSIS — I129 Hypertensive chronic kidney disease with stage 1 through stage 4 chronic kidney disease, or unspecified chronic kidney disease: Secondary | ICD-10-CM | POA: Diagnosis not present

## 2024-02-29 DIAGNOSIS — Z1331 Encounter for screening for depression: Secondary | ICD-10-CM | POA: Diagnosis not present

## 2024-02-29 DIAGNOSIS — N1831 Chronic kidney disease, stage 3a: Secondary | ICD-10-CM | POA: Diagnosis not present

## 2024-02-29 DIAGNOSIS — G4733 Obstructive sleep apnea (adult) (pediatric): Secondary | ICD-10-CM | POA: Diagnosis not present

## 2024-02-29 DIAGNOSIS — K219 Gastro-esophageal reflux disease without esophagitis: Secondary | ICD-10-CM | POA: Diagnosis not present

## 2024-03-06 ENCOUNTER — Encounter (HOSPITAL_COMMUNITY): Payer: Self-pay

## 2024-03-06 ENCOUNTER — Ambulatory Visit (HOSPITAL_COMMUNITY)
Admission: RE | Admit: 2024-03-06 | Discharge: 2024-03-06 | Disposition: A | Discharge status: Home / Self Care | Source: Ambulatory Visit | Attending: Internal Medicine | Admitting: Internal Medicine

## 2024-03-06 DIAGNOSIS — Z1231 Encounter for screening mammogram for malignant neoplasm of breast: Secondary | ICD-10-CM | POA: Insufficient documentation

## 2024-06-16 NOTE — Patient Instructions (Incomplete)
 Please continue using your CPAP regularly. While your insurance requires that you use CPAP at least 4 hours each night on 70% of the nights, I recommend, that you not skip any nights and use it throughout the night if you can. Getting used to CPAP and staying with the treatment long term does take time and patience and discipline. Untreated obstructive sleep apnea when it is moderate to severe can have an adverse impact on cardiovascular health and raise her risk for heart disease, arrhythmias, hypertension, congestive heart failure, stroke and diabetes. Untreated obstructive sleep apnea causes sleep disruption, nonrestorative sleep, and sleep deprivation. This can have an impact on your day to day functioning and cause daytime sleepiness and impairment of cognitive function, memory loss, mood disturbance, and problems focussing. Using CPAP regularly can improve these symptoms.  We will update supply orders, today. Keep an eye on your blood pressure readings at home.   Follow up in 1 year

## 2024-06-16 NOTE — Progress Notes (Unsigned)
 PATIENT: Angelica Mcconnell DOB: 03/09/1946  REASON FOR VISIT: follow up HISTORY FROM: patient  No chief complaint on file.    HISTORY OF PRESENT ILLNESS:  06/16/24 ALL:  Angelica Mcconnell returns for follow up for OSA on CPAP.     06/14/2023 ALL:  Angelica Mcconnell returns for follow up for OSA on CPAP. She continues to do well. She is using CPAP nightly for about 9 hours, on average. She denies concerns with machine or supplies. She has not received new supplies. She has been alternating between- masks.  BP is usually well managed. She feels it is elevated due to fighting traffic to get here. Angelica Mcconnell, it was 129/80.     03/06/2022 ALL:  Angelica Mcconnell is a 78 y.o. female here today for follow up for OSA on CPAP. She reports doing well on therapy. Her machine was set up in 2015. She has not been able to get new supplies as she is no longer in network with [facility]. We do not have data to review, today. She reports using CPAP nearly every night. She denies concerns with machine and feels it helps her rest.   HISTORY: (copied from Dr Obie previous note)  Angelica Mcconnell is a 78 year old right-handed woman with an underlying medical history of obesity, hypertension, prior smoking, reflux disease, who presents for re-evaluation of her obstructive sleep apnea after a long gap. The patient is unaccompanied today and presents to reestablish care. She was last seen in this clinic nearly 3  years ago, on 06/05/17, at which time she was compliant with her CPAP and endorsed ongoing good results.    Today, 11/16/2020: I reviewed her CPAP compliance data from 10/13/2020 through 11/11/2020, which is a total of 30 days, during which time she used her machine 22 days with percent use days greater than 4 hours at 70%, indicating adequate compliance with an average usage of 8 hours/days on treatment, residual AHI at goal at 0.5/h, leak on the low side with a 95th percentile at 0.2 L/min on a pressure of 6 cm  with EPR of 3.  She reports doing well with her CPAP.  She tried to skip every other night to see if she can go without treatment but overall feels better with treatment and has been using her machine more consistently.  She does need new supplies, DME company is [employer].  She was informed that she will need a new prescription.  She goes to bed generally between 11 PM or midnight, she lives alone, rise time varies, she will listen to her pastor between 6 and 8 on the phone and after prior she will often stay in bed and may doze off until 9 AM typically.  She had 1 new prescription for her blood pressure, she has a physical coming up later this month with blood work a week prior.  She has had some allergy issues, uses over-the-counter nasal spray and allergy medicine.  She does not drink caffeine on a day-to-day basis owing to reflux issues and also blood pressure.   REVIEW OF SYSTEMS: Out of a complete 14 system review of symptoms, the patient complains only of the following symptoms, none and all other reviewed systems are negative.  ESS: 0/24  ALLERGIES: No Known Allergies  HOME MEDICATIONS: Outpatient Medications Prior to Visit  Medication Sig Dispense Refill   allopurinol (ZYLOPRIM) 300 MG tablet Take 300 mg by mouth daily.     amLODipine (NORVASC) 5 MG tablet Take 5 mg  by mouth daily.     aspirin EC 81 MG tablet Take 81 mg by mouth daily.     Calcium Carbonate-Vitamin D (CALTRATE 600+D PO) Take 1 tablet by mouth daily.      fluticasone (FLONASE) 50 MCG/ACT nasal spray Place 1-2 sprays into both nostrils as needed.      ipratropium (ATROVENT) 0.06 % nasal spray SMARTSIG:2 Spray(s) Both Nares 3 Times Daily PRN     irbesartan (AVAPRO) 300 MG tablet Take 1 tablet by mouth daily.     latanoprost (XALATAN) 0.005 % ophthalmic solution Place 1 drop into both eyes at bedtime.     metoprolol  tartrate (LOPRESSOR ) 25 MG tablet Take 1 tablet (25 mg total) by mouth 2 (two) times daily. 60  tablet 2   Multiple Vitamin (MULTIVITAMIN) capsule Take 1 capsule by mouth daily.     omeprazole (PRILOSEC) 20 MG capsule Take 20 mg by mouth daily as needed. Takes only as needed     promethazine -dextromethorphan (PROMETHAZINE -DM) 6.25-15 MG/5ML syrup Take 5 mLs by mouth 4 (four) times daily as needed. 100 mL 0   Vitamin D, Cholecalciferol, 25 MCG (1000 UT) TABS Take by mouth.     No facility-administered medications prior to visit.    PAST MEDICAL HISTORY: Past Medical History:  Diagnosis Date   Gout    HTN (hypertension)    OSA (obstructive sleep apnea) 07/15/2013   Reflux    Sinus infection     PAST SURGICAL HISTORY: Past Surgical History:  Procedure Laterality Date   COLONOSCOPY     COLONOSCOPY [name] 08/31/2014   Procedure: COLONOSCOPY;  Surgeon: Lamar CHRISTELLA Hollingshead, MD;  Location: [facility];  Service: Endoscopy;  Laterality: [name];  8:30 AM   Fybroid emoval      FAMILY HISTORY: Family History  Problem Relation Age of Onset   Diabetes Mother    Thyroid disease Mother    Heart failure Mother 61       Medication damage    SOCIAL HISTORY: Social History   Socioeconomic History   Marital status: Single    Spouse name: Not on file   Number of children: 1   Years of education: Not on file   Highest education level: Not on file  Occupational History   Not on file  Tobacco Use   Smoking status: Former    Current packs/day: 0.00    Types: Cigarettes    Quit date: 07/15/1989    Years since quitting: 34.9    Passive exposure: Never   Smokeless tobacco: Never  Vaping Use   Vaping status: Never Used  Substance and Sexual Activity   Alcohol  use: No   Drug use: No   Sexual activity: Not Currently    Birth control/protection: None  Other Topics Concern   Not on file  Social History Narrative   Right handed, Caffeine 1-2 monthly, Single, 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  Retired.     Social Drivers of Corporate Investment Banker Strain: Not on file  Food Insecurity: Not on file   Transportation Needs: Not on file  Physical Activity: Not on file  Stress: Not on file  Social Connections: Not on file  Intimate Partner Violence: Not on file     PHYSICAL EXAM  There were no vitals filed for this visit.   There is no height or weight on file to calculate BMI.  Generalized: Well developed, in no acute distress  Cardiology: normal rate and rhythm, no murmur noted Respiratory: clear to  auscultation bilaterally  Neurological examination  Mentation: Alert oriented to time, place, history taking. Follows all commands speech and language fluent Cranial nerve II-XII: Pupils were equal round reactive to light. Extraocular movements were full, visual field were full  Motor: The motor testing reveals 5 over 5 strength of all 4 extremities. Good symmetric motor tone is noted throughout.  Gait and station: Gait is normal.    DIAGNOSTIC DATA (LABS, IMAGING, TESTING) - I reviewed patient records, labs, notes, testing and imaging myself where available.      No data to display           No results found for: WBC, HGB, HCT, MCV, PLT No results found for: NA, K, CL, CO2, GLUCOSE, BUN, CREATININE, CALCIUM, PROT, ALBUMIN, AST, ALT, ALKPHOS, BILITOT, GFRNONAA, GFRAA No results found for: CHOL, HDL, LDLCALC, LDLDIRECT, TRIG, CHOLHDL No results found for: YHAJ8R No results found for: VITAMINB12 No results found for: TSH   ASSESSMENT AND PLAN 78 y.o. year old female  has a past medical history of Gout, HTN (hypertension), OSA (obstructive sleep apnea) (07/15/2013), Reflux, and Sinus infection. here with   No diagnosis found.   Angelica Mcconnell is doing well on CPAP therapy. She was encouraged to continue using CPAP nightly and for greater than 4 hours each night. Risks of untreated sleep apnea review and education materials provided. I have advised she reach out to DME for replacement supplies. She will monitor  BP at home. Healthy lifestyle habits encouraged. She will follow up in 1 year, sooner if needed. She verbalizes understanding and agreement with this plan.    No orders of the defined types were placed in this encounter.    No orders of the defined types were placed in this encounter.     Greig Forbes, FNP-C 06/16/2024, 2:23 PM [facility] [address] [place], [place] [address] [phone]

## 2024-06-19 ENCOUNTER — Ambulatory Visit: Payer: Medicare HMO | Admitting: Family Medicine

## 2024-06-19 ENCOUNTER — Encounter: Payer: Self-pay | Admitting: Family Medicine

## 2024-06-19 VITALS — BP 142/86 | HR 87 | Ht 63.0 in | Wt 236.0 lb

## 2024-06-19 DIAGNOSIS — R030 Elevated blood-pressure reading, without diagnosis of hypertension: Secondary | ICD-10-CM

## 2024-06-19 DIAGNOSIS — G4733 Obstructive sleep apnea (adult) (pediatric): Secondary | ICD-10-CM

## 2024-06-20 DIAGNOSIS — I129 Hypertensive chronic kidney disease with stage 1 through stage 4 chronic kidney disease, or unspecified chronic kidney disease: Secondary | ICD-10-CM | POA: Diagnosis not present

## 2024-06-20 DIAGNOSIS — Z6841 Body Mass Index (BMI) 40.0 and over, adult: Secondary | ICD-10-CM | POA: Diagnosis not present

## 2024-06-20 DIAGNOSIS — N1831 Chronic kidney disease, stage 3a: Secondary | ICD-10-CM | POA: Diagnosis not present

## 2024-06-20 DIAGNOSIS — Z23 Encounter for immunization: Secondary | ICD-10-CM | POA: Diagnosis not present

## 2024-06-20 DIAGNOSIS — J309 Allergic rhinitis, unspecified: Secondary | ICD-10-CM | POA: Diagnosis not present

## 2024-06-20 DIAGNOSIS — M109 Gout, unspecified: Secondary | ICD-10-CM | POA: Diagnosis not present

## 2024-06-20 DIAGNOSIS — E1122 Type 2 diabetes mellitus with diabetic chronic kidney disease: Secondary | ICD-10-CM | POA: Diagnosis not present

## 2025-06-22 ENCOUNTER — Ambulatory Visit: Admitting: Neurology
